# Patient Record
Sex: Female | Born: 1935 | Race: White | Hispanic: No | Marital: Married | Smoking: Never smoker
Health system: Southern US, Community
[De-identification: ages and names within clinical notes are randomized; demographics above are authoritative.]

## PROBLEM LIST (undated history)

## (undated) DIAGNOSIS — T8859XA Other complications of anesthesia, initial encounter: Secondary | ICD-10-CM

## (undated) DIAGNOSIS — K219 Gastro-esophageal reflux disease without esophagitis: Secondary | ICD-10-CM

## (undated) DIAGNOSIS — F419 Anxiety disorder, unspecified: Secondary | ICD-10-CM

## (undated) DIAGNOSIS — I10 Essential (primary) hypertension: Secondary | ICD-10-CM

## (undated) DIAGNOSIS — T4145XA Adverse effect of unspecified anesthetic, initial encounter: Secondary | ICD-10-CM

## (undated) DIAGNOSIS — M1990 Unspecified osteoarthritis, unspecified site: Secondary | ICD-10-CM

## (undated) DIAGNOSIS — E039 Hypothyroidism, unspecified: Secondary | ICD-10-CM

## (undated) DIAGNOSIS — I2699 Other pulmonary embolism without acute cor pulmonale: Secondary | ICD-10-CM

## (undated) DIAGNOSIS — E785 Hyperlipidemia, unspecified: Secondary | ICD-10-CM

## (undated) DIAGNOSIS — R9439 Abnormal result of other cardiovascular function study: Secondary | ICD-10-CM

## (undated) DIAGNOSIS — D649 Anemia, unspecified: Secondary | ICD-10-CM

## (undated) DIAGNOSIS — K589 Irritable bowel syndrome without diarrhea: Secondary | ICD-10-CM

## (undated) DIAGNOSIS — Q602 Renal agenesis, unspecified: Secondary | ICD-10-CM

## (undated) DIAGNOSIS — G709 Myoneural disorder, unspecified: Secondary | ICD-10-CM

## (undated) DIAGNOSIS — Z5189 Encounter for other specified aftercare: Secondary | ICD-10-CM

## (undated) DIAGNOSIS — I209 Angina pectoris, unspecified: Secondary | ICD-10-CM

## (undated) DIAGNOSIS — R569 Unspecified convulsions: Secondary | ICD-10-CM

## (undated) DIAGNOSIS — E1140 Type 2 diabetes mellitus with diabetic neuropathy, unspecified: Secondary | ICD-10-CM

## (undated) DIAGNOSIS — K5790 Diverticulosis of intestine, part unspecified, without perforation or abscess without bleeding: Secondary | ICD-10-CM

## (undated) HISTORY — PX: BACK SURGERY: SHX140

## (undated) HISTORY — DX: Abnormal result of other cardiovascular function study: R94.39

## (undated) HISTORY — PX: CARDIAC CATHETERIZATION: SHX172

## (undated) HISTORY — DX: Unspecified osteoarthritis, unspecified site: M19.90

## (undated) HISTORY — PX: JOINT REPLACEMENT: SHX530

## (undated) HISTORY — PX: CHOLECYSTECTOMY: SHX55

## (undated) NOTE — *Deleted (*Deleted)
Neurology Consultation Reason for Consult: Stroke on MRI Referring Physician: Dr. Whitney Post    CC: Left hand loss of control    History is obtained from: Patient and chart review   HPI: Hannah Fischer is a 58 y.o. female with a past medical history significant for type 2 diabetes (A1c 7.5%), hypertension, hyperlipidemia, hypothyroidism, prior pulmonary embolus, degenerative disc disease, single kidney (agenesis),  She describes that she was in her usual state of health when she began to notice left hand weakness that came on acutely and has not improved since it began.  MRI revealed stroke for which neurology was consulted.  Review of systems is notable with positive for intermittent palpitations sometimes associated with feeling weak, chronic fatigue secondary to chronic poor sleep, mild headache for the past year, but otherwise negative for vision changes, hearing changes, other focal weakness or numbness, bowel or bladder symptoms, rashes, fevers, chills, sweats, marked changes in weight, difficulty speaking or swallowing,  LKW: 10 AM on 10/19 tPA given?: No, due to out of the window; additionally this will be contraindicated in the future given MRI findings concerning for CAA   Premorbid modified rankin scale:      0 - No symptoms.    Past Medical History:  Diagnosis Date  . Abnormal nuclear stress test    2005, normal cath Dr. Elease Hashimoto, normal stress test 2012 Dr. Eldridge Dace  . Anemia    chronic  . Anxiety   . Arthritis    djd  . Arthritis of shoulder region, left, degenerative 12/03/2013  . Blood transfusion 1979   for anemia  . Chest pain    evaluated @ [facility] cardiology; had a stress test 2012  . Chronic pain syndrome 04/29/2019  . Chronic right-sided low back pain without sciatica 04/12/2019  . Complication of anesthesia    unable to breathe lying  flat on back  . Diabetes mellitus    type 2  niddm x 25 yrs  . Diabetes mellitus (HCC) 11/15/2011  . Diverticular disease    . DJD (degenerative joint disease)    lumbar and cervical  . GERD (gastroesophageal reflux disease)   . Hyperlipemia   . Hypertension    on medication  . Hypothyroidism    takes levoxyl  . Irritable bowel syndrome   . 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 Neuromuscular disorder (HCC)   . Neuropathy in diabetes (HCC)   . Nonspecific abnormal electrocardiogram (ECG) (EKG) 10/15/2013  . Other and unspecified angina pectoris    tests came back NOT heart related  . Pulmonary embolism (HCC) 06-2010   bilateral  . Pyuria 11/15/2011  . Seizures (HCC)    once, age 61  . TIA (transient ischemic attack) 11/15/2011   Past Surgical History:  Procedure Laterality Date  . BACK SURGERY    . BLEPHAROPLASTY  2012   rt eye  . CARDIAC CATHETERIZATION     had one 10/2013  . CHOLECYSTECTOMY    . EYE SURGERY  2007   cataract ext/ iol rt eye  . JOINT REPLACEMENT  2001,2005   both knees  . LEFT HEART CATHETERIZATION WITH CORONARY ANGIOGRAM N/A 10/18/2013   Procedure: LEFT HEART CATHETERIZATION WITH CORONARY ANGIOGRAM;  Surgeon: Corky Crafts, MD;  Location: [facility] CATH LAB;  Service: Cardiovascular;  Laterality: N/A;  . LUMBAR DISC SURGERY  1990  . REVERSE SHOULDER ARTHROPLASTY Left 12/03/2013   Procedure: LEFT SHOULDER REVERSE ARTHROPLASTY;  Surgeon: Verlee Rossetti, MD;  Location: [facility] OR;  Service: Orthopedics;  Laterality: Left;  . SHOULDER HEMI-ARTHROPLASTY  12/25/2010   Procedure: SHOULDER HEMI-ARTHROPLASTY;  Surgeon: Cammy Copa;  Location: [facility] OR;  Service: Orthopedics;  Laterality: Right;   Current Outpatient Medications  Medication Instructions  . ALPRAZolam (XANAX) 0.25 mg, Oral, At bedtime PRN  . cephALEXin (KEFLEX) 500 mg, Oral, 2 times daily  . clopidogrel (PLAVIX) 75 mg, Oral, Daily with breakfast  . Cyanocobalamin (VITAMIN B-12 PO) 1 tablet, Oral, Daily  . DULoxetine (CYMBALTA) 30 MG capsule TAKE 1 CAPSULE(30 MG) BY MOUTH DAILY  . levothyroxine (SYNTHROID) 100  mcg, Oral, Daily  . losartan (COZAAR) 100 mg, Oral, Daily  . methocarbamol (ROBAXIN) 500 mg, Oral, Every 8 hours PRN  . Omega-3 Fatty Acids (FISH OIL) 1000 MG CAPS 1 capsule, Oral, 2 times daily  . pioglitazone (ACTOS) 45 mg, Daily  . propranolol (INDERAL) 40 mg, Oral, Daily  . traZODone (DESYREL) 50 MG tablet TAKE 1 TABLET BY MOUTH AT BEDTIME  . Vitamin D3 2,000 Units, Daily    Family History  Problem Relation Age of Onset  . Diabetes Mother   . Heart disease Mother   . Heart disease Sister   . Heart disease Brother     Social History:  reports that she has never smoked. She has never used smokeless tobacco. She reports that she does not drink alcohol and does not use drugs.   Exam: Current vital signs: BP (!) 147/67 (BP Location: Right Arm)   Pulse 68   Temp 98.2 F (36.8 C) (Oral)   Resp 19   Ht 5\' 3"  (1.6 m)   Wt 80.3 kg   SpO2 97%   BMI 31.35 kg/m  Vital signs in last 24 hours: Temp:  [98.1 F (36.7 C)-98.2 F (36.8 C)] 98.2 F (36.8 C) (10/21 0401) Pulse Rate:  [53-88] 68 (10/21 0401) Resp:  [15-27] 19 (10/21 0401) BP: (95-196)/(41-91) 147/67 (10/21 0401) SpO2:  [90 %-99 %] 97 % (10/21 0401)   Physical Exam  Constitutional: Appears well-developed and well-nourished.  Psych: Affect appropriate to situation Eyes: No scleral injection HENT: No OP obstrucion MSK: no joint deformities.  Cardiovascular: Normal rate and regular rhythm.  Respiratory: Effort normal, non-labored breathing GI: Soft.  No distension. There is no tenderness.  Skin: WDI  Neuro: Mental Status: Patient is awake, alert, oriented to person, place, month, year, and situation. Patient is able to give a clear and coherent history. No signs of aphasia or neglect She cannot remember the name of the current president, but names Trump is the prior president Cranial Nerves: II: Visual Fields are full. Pupils are equal, round, and reactive to light.   III,IV, VI: EOMI without ptosis or  diploplia.  V: Facial sensation is symmetric to temperature VII: Facial movement is symmetric.  VIII: hearing is intact to voice X: Uvula elevates symmetrically XI: Shoulder shrug is symmetric. XII: tongue is midline without atrophy or fasciculations.  Motor: Tone is normal. Bulk is normal. 5/5 strength was present in all four extremities with the exception of the left wrist extension (4/5) and finger flexion (4-/5) Sensory: Sensation is symmetric to light touch and temperature in the arms and legs; subtly decreased in the left hand Deep Tendon Reflexes: 2+ and symmetric in the biceps and patellae.  Plantars: Toes are downgoing bilaterally.  Cerebellar: FNF and HKS are intact bilaterally   I have reviewed labs in epic and the results pertinent to this consultation are:   Lab Results  Component  Value Date   CHOL 212 (H) 11/24/2019   HDL 58 11/24/2019   LDLCALC 128 (H) 11/24/2019   TRIG 130 11/24/2019   CHOLHDL 3.7 11/24/2019   Lab Results  Component Value Date   HGBA1C 7.5 (H) 11/23/2019   I have reviewed the images obtained personally:  HCT without clear acute abnormality, chronic findings as detailed in radiology report MRI brain with multifocal small strokes in the posterior circulation bilaterally as well as SWI imaging consistent with CAA MRA with scattered atherosclerotic changes   ECHO w/ grade II diastolic dysfunction, mild cLVH moderate left atrial enlargementn  Impression: The largest stroke on MRI is right at the left cortical hand knob, correlating well with patient's symptoms.  Given the pattern, I favor an atheroembolic or cardioembolic shower of the posterior circulation.  However treatment is complicated by imaging findings of CAA.  Interestingly she does not appear to have much cognitive impairment on at least a cursory evaluation of the same.  Given high risk of life-threatening hemorrhage with cerebral amyloid angiopathy, I favor discontinuing Plavix if the  patient is cleared by cardiology to discontinue it.   Recommendations:  # scattered small posterior strokes, most likely atheroembolic, possibly cardioembolic # MRI w/ c/f cerebral amyloid angiopathy (CAA) - Stroke labs HgbA1c, fasting lipid panel completed as above with need for improvement in cholesterol control (goal LDL < 70) and A1c (goal < 7%) - MRI brain completed as above  - MRA of the brain without contrast and MRA neck w/wo completed as above - Echocardiogram - Despite strokes, given CAA findings with high risk of life-threatening lobar hemorrhage, please discuss with cardiology if Plavix can be discontinued - Risk factor modification - Telemetry monitoring; 30 day event monitor on discharge if no arrythmias captured  - Blood pressure goal   - Normotension - PT consult, OT consult, Speech consult, - Stroke team to follow  Brooke Dare MD-PhD [facility] [phone]

---

## 1977-02-04 DIAGNOSIS — Z5189 Encounter for other specified aftercare: Secondary | ICD-10-CM

## 1977-02-04 HISTORY — DX: Encounter for other specified aftercare: Z51.89

## 1988-02-05 HISTORY — PX: LUMBAR DISC SURGERY: SHX700

## 1999-05-23 ENCOUNTER — Encounter: Payer: Self-pay | Admitting: Family Medicine

## 1999-05-23 ENCOUNTER — Encounter: Admission: RE | Admit: 1999-05-23 | Discharge: 1999-05-23 | Payer: Self-pay | Admitting: Family Medicine

## 1999-05-31 ENCOUNTER — Encounter: Payer: Self-pay | Admitting: Neurosurgery

## 1999-06-04 ENCOUNTER — Encounter: Payer: Self-pay | Admitting: Neurosurgery

## 1999-06-04 ENCOUNTER — Inpatient Hospital Stay (HOSPITAL_COMMUNITY): Admission: RE | Admit: 1999-06-04 | Discharge: 1999-06-05 | Payer: Self-pay | Admitting: Neurosurgery

## 1999-10-04 ENCOUNTER — Encounter: Payer: Self-pay | Admitting: Specialist

## 1999-10-12 ENCOUNTER — Inpatient Hospital Stay (HOSPITAL_COMMUNITY): Admission: RE | Admit: 1999-10-12 | Discharge: 1999-10-16 | Payer: Self-pay | Admitting: Specialist

## 2000-10-14 ENCOUNTER — Encounter: Payer: Self-pay | Admitting: Family Medicine

## 2000-10-14 ENCOUNTER — Encounter: Admission: RE | Admit: 2000-10-14 | Discharge: 2000-10-14 | Payer: Self-pay | Admitting: Family Medicine

## 2000-10-24 ENCOUNTER — Encounter: Payer: Self-pay | Admitting: Surgery

## 2000-10-27 ENCOUNTER — Ambulatory Visit (HOSPITAL_COMMUNITY): Admission: RE | Admit: 2000-10-27 | Discharge: 2000-10-28 | Payer: Self-pay | Admitting: Surgery

## 2000-10-27 ENCOUNTER — Encounter (INDEPENDENT_AMBULATORY_CARE_PROVIDER_SITE_OTHER): Payer: Self-pay | Admitting: *Deleted

## 2000-10-27 ENCOUNTER — Encounter: Payer: Self-pay | Admitting: Surgery

## 2002-03-02 ENCOUNTER — Encounter: Admission: RE | Admit: 2002-03-02 | Discharge: 2002-05-31 | Payer: Self-pay | Admitting: Family Medicine

## 2002-08-17 ENCOUNTER — Encounter: Admission: RE | Admit: 2002-08-17 | Discharge: 2002-11-15 | Payer: Self-pay | Admitting: Family Medicine

## 2003-01-11 ENCOUNTER — Ambulatory Visit (HOSPITAL_COMMUNITY): Admission: RE | Admit: 2003-01-11 | Discharge: 2003-01-11 | Payer: Self-pay | Admitting: Gastroenterology

## 2003-01-11 ENCOUNTER — Encounter (INDEPENDENT_AMBULATORY_CARE_PROVIDER_SITE_OTHER): Payer: Self-pay

## 2003-07-21 ENCOUNTER — Encounter: Admission: RE | Admit: 2003-07-21 | Discharge: 2003-08-25 | Payer: Self-pay | Admitting: Family Medicine

## 2003-08-02 ENCOUNTER — Encounter: Admission: RE | Admit: 2003-08-02 | Discharge: 2003-08-02 | Payer: Self-pay | Admitting: Family Medicine

## 2003-09-28 ENCOUNTER — Ambulatory Visit (HOSPITAL_COMMUNITY): Admission: RE | Admit: 2003-09-28 | Discharge: 2003-09-28 | Payer: Self-pay | Admitting: Cardiovascular Disease

## 2003-10-21 ENCOUNTER — Inpatient Hospital Stay (HOSPITAL_COMMUNITY): Admission: RE | Admit: 2003-10-21 | Discharge: 2003-10-26 | Payer: Self-pay | Admitting: Specialist

## 2004-08-28 ENCOUNTER — Encounter: Admission: RE | Admit: 2004-08-28 | Discharge: 2004-11-26 | Payer: Self-pay | Admitting: Family Medicine

## 2005-02-04 HISTORY — PX: EYE SURGERY: SHX253

## 2005-02-09 ENCOUNTER — Encounter: Admission: RE | Admit: 2005-02-09 | Discharge: 2005-02-09 | Payer: Self-pay | Admitting: Specialist

## 2006-05-29 ENCOUNTER — Inpatient Hospital Stay (HOSPITAL_COMMUNITY): Admission: AD | Admit: 2006-05-29 | Discharge: 2006-06-05 | Payer: Self-pay | Admitting: Family Medicine

## 2006-05-30 ENCOUNTER — Encounter: Payer: Self-pay | Admitting: Vascular Surgery

## 2006-06-02 ENCOUNTER — Ambulatory Visit: Payer: Self-pay | Admitting: Vascular Surgery

## 2006-06-02 ENCOUNTER — Encounter (INDEPENDENT_AMBULATORY_CARE_PROVIDER_SITE_OTHER): Payer: Self-pay | Admitting: Cardiology

## 2006-06-02 ENCOUNTER — Encounter: Payer: Self-pay | Admitting: Vascular Surgery

## 2006-11-14 ENCOUNTER — Ambulatory Visit: Payer: Self-pay | Admitting: Oncology

## 2006-12-24 LAB — CBC WITH DIFFERENTIAL/PLATELET
BASO%: 0.3 % (ref 0.0–2.0)
Basophils Absolute: 0 10*3/uL (ref 0.0–0.1)
EOS%: 2.9 % (ref 0.0–7.0)
Eosinophils Absolute: 0.1 10*3/uL (ref 0.0–0.5)
HCT: 34.3 % — ABNORMAL LOW (ref 34.8–46.6)
HGB: 11.9 g/dL (ref 11.6–15.9)
LYMPH%: 32.8 % (ref 14.0–48.0)
MCH: 31.1 pg (ref 26.0–34.0)
MCHC: 34.7 g/dL (ref 32.0–36.0)
MCV: 89.8 fL (ref 81.0–101.0)
MONO#: 0.4 10*3/uL (ref 0.1–0.9)
MONO%: 7.8 % (ref 0.0–13.0)
NEUT#: 2.5 10*3/uL (ref 1.5–6.5)
NEUT%: 56.2 % (ref 39.6–76.8)
Platelets: 220 10*3/uL (ref 145–400)
RBC: 3.82 10*6/uL (ref 3.70–5.32)
RDW: 14.4 % (ref 11.3–14.5)
WBC: 4.5 10*3/uL (ref 3.9–10.0)
lymph#: 1.5 10*3/uL (ref 0.9–3.3)

## 2006-12-24 LAB — MORPHOLOGY
PLT EST: ADEQUATE
RBC Comments: NORMAL

## 2006-12-24 LAB — PROTIME-INR
INR: 1.7 — ABNORMAL LOW (ref 2.00–3.50)
Protime: 20.4 Seconds — ABNORMAL HIGH (ref 10.6–13.4)

## 2006-12-25 LAB — FIBRINOGEN: Fibrinogen: 310 mg/dL (ref 204–475)

## 2006-12-25 LAB — D-DIMER, QUANTITATIVE (NOT AT ARMC): D-Dimer, Quant: 0.28 ug/mL-FEU (ref 0.00–0.48)

## 2006-12-25 LAB — FACTOR 8 ASSAY: Coagulation Factor VIII: 155 % — ABNORMAL HIGH (ref 73–140)

## 2007-01-05 ENCOUNTER — Ambulatory Visit: Payer: Self-pay | Admitting: Oncology

## 2007-01-06 LAB — CARDIOLIPIN ANTIBODIES, IGG, IGM, IGA
Anticardiolipin IgA: <7 [APL'U] (ref <13)
Anticardiolipin IgG: <7 [GPL'U] (ref <11)
Anticardiolipin IgM: <7 [MPL'U] (ref <10)

## 2007-01-06 LAB — LUPUS ANTICOAGULANT PANEL
DRVVT 1:1 Mix: 42 secs (ref 36.1–47.0)
DRVVT: 55.3 secs — ABNORMAL HIGH (ref 36.1–47.0)
PTT Lupus Anticoagulant: 47.3 secs (ref 36.3–48.8)

## 2007-01-06 LAB — BETA-2 GLYCOPROTEIN ANTIBODIES
Beta-2 Glyco I IgG: <4 U/mL (ref <20)
Beta-2-Glycoprotein I IgA: <4 U/mL (ref <10)
Beta-2-Glycoprotein I IgM: <4 U/mL (ref <10)

## 2007-01-06 LAB — ANTITHROMBIN III: AntiThromb III Func: 91 % (ref 76–126)

## 2007-01-06 LAB — PROTHROMBIN GENE MUTATION

## 2007-01-06 LAB — FACTOR 5 LEIDEN

## 2007-01-16 LAB — PROTHROMBIN GENE MUTATION

## 2007-01-16 LAB — LUPUS ANTICOAGULANT PANEL
DRVVT 1:1 Mix: 35.9 secs — ABNORMAL LOW (ref 36.1–47.0)
DRVVT: 57.3 secs — ABNORMAL HIGH (ref 36.1–47.0)
PTT Lupus Anticoagulant: 50.5 secs — ABNORMAL HIGH (ref 36.3–48.8)
PTTLA 4:1 Mix: 44.9 secs (ref 36.3–48.8)

## 2007-01-16 LAB — IMMUNOFIXATION ELECTROPHORESIS
IgA: 177 mg/dL (ref 68–378)
IgG (Immunoglobin G), Serum: 933 mg/dL (ref 694–1618)
IgM, Serum: 86 mg/dL (ref 60–263)
Total Protein, Serum Electrophoresis: 7.5 g/dL (ref 6.0–8.3)

## 2007-01-16 LAB — BETA-2 GLYCOPROTEIN ANTIBODIES
Beta-2 Glyco I IgG: 6 U/mL (ref <20)
Beta-2-Glycoprotein I IgA: 5 U/mL (ref <10)
Beta-2-Glycoprotein I IgM: <4 U/mL (ref <10)

## 2007-01-16 LAB — ANA: Anti Nuclear Antibody(ANA): NEGATIVE

## 2007-01-16 LAB — CARDIOLIPIN ANTIBODIES, IGG, IGM, IGA
Anticardiolipin IgA: 8 [APL'U] (ref <13)
Anticardiolipin IgG: <7 [GPL'U] (ref <11)
Anticardiolipin IgM: <7 [MPL'U] (ref <10)

## 2007-01-16 LAB — FACTOR 5 LEIDEN

## 2007-01-16 LAB — ANTITHROMBIN III: AntiThromb III Func: 90 % (ref 76–126)

## 2007-05-08 ENCOUNTER — Ambulatory Visit: Payer: Self-pay | Admitting: Oncology

## 2007-05-12 LAB — CBC WITH DIFFERENTIAL/PLATELET
BASO%: 0.3 % (ref 0.0–2.0)
Basophils Absolute: 0 10*3/uL (ref 0.0–0.1)
EOS%: 2 % (ref 0.0–7.0)
Eosinophils Absolute: 0.1 10*3/uL (ref 0.0–0.5)
HCT: 34.4 % — ABNORMAL LOW (ref 34.8–46.6)
HGB: 11.8 g/dL (ref 11.6–15.9)
LYMPH%: 27.1 % (ref 14.0–48.0)
MCH: 30 pg (ref 26.0–34.0)
MCHC: 34.2 g/dL (ref 32.0–36.0)
MCV: 87.9 fL (ref 81.0–101.0)
MONO#: 0.5 10*3/uL (ref 0.1–0.9)
MONO%: 9.5 % (ref 0.0–13.0)
NEUT#: 3.3 10*3/uL (ref 1.5–6.5)
NEUT%: 61.1 % (ref 39.6–76.8)
Platelets: 251 10*3/uL (ref 145–400)
RBC: 3.92 10*6/uL (ref 3.70–5.32)
RDW: 14.6 % — ABNORMAL HIGH (ref 11.3–14.5)
WBC: 5.5 10*3/uL (ref 3.9–10.0)
lymph#: 1.5 10*3/uL (ref 0.9–3.3)

## 2007-05-12 LAB — PROTIME-INR
INR: 2.6 (ref 2.00–3.50)
Protime: 31.2 Seconds — ABNORMAL HIGH (ref 10.6–13.4)

## 2008-01-21 ENCOUNTER — Encounter: Admission: RE | Admit: 2008-01-21 | Discharge: 2008-01-21 | Payer: Self-pay | Admitting: Family Medicine

## 2008-05-05 ENCOUNTER — Ambulatory Visit: Payer: Self-pay | Admitting: Oncology

## 2008-05-10 LAB — CBC WITH DIFFERENTIAL/PLATELET
BASO%: 0.7 % (ref 0.0–2.0)
Basophils Absolute: 0 10*3/uL (ref 0.0–0.1)
EOS%: 3 % (ref 0.0–7.0)
Eosinophils Absolute: 0.1 10*3/uL (ref 0.0–0.5)
HCT: 33.2 % — ABNORMAL LOW (ref 34.8–46.6)
HGB: 11.1 g/dL — ABNORMAL LOW (ref 11.6–15.9)
LYMPH%: 30.7 % (ref 14.0–49.7)
MCH: 29.7 pg (ref 25.1–34.0)
MCHC: 33.6 g/dL (ref 31.5–36.0)
MCV: 88.5 fL (ref 79.5–101.0)
MONO#: 0.4 10*3/uL (ref 0.1–0.9)
MONO%: 8.5 % (ref 0.0–14.0)
NEUT#: 2.5 10*3/uL (ref 1.5–6.5)
NEUT%: 57.1 % (ref 38.4–76.8)
Platelets: 187 10*3/uL (ref 145–400)
RBC: 3.75 10*6/uL (ref 3.70–5.45)
RDW: 15 % — ABNORMAL HIGH (ref 11.2–14.5)
WBC: 4.4 10*3/uL (ref 3.9–10.3)
lymph#: 1.4 10*3/uL (ref 0.9–3.3)

## 2008-05-12 LAB — FOLATE: Folate: >20 ng/mL

## 2008-05-12 LAB — IMMUNOFIXATION ELECTROPHORESIS
IgA: 187 mg/dL (ref 68–378)
IgG (Immunoglobin G), Serum: 872 mg/dL (ref 694–1618)
IgM, Serum: 74 mg/dL (ref 60–263)
Total Protein, Serum Electrophoresis: 6.7 g/dL (ref 6.0–8.3)

## 2008-05-12 LAB — IRON AND TIBC
%SAT: 11 % — ABNORMAL LOW (ref 20–55)
Iron: 42 ug/dL (ref 42–145)
TIBC: 371 ug/dL (ref 250–470)
UIBC: 329 ug/dL

## 2008-05-12 LAB — VITAMIN B12: Vitamin B-12: 256 pg/mL (ref 211–911)

## 2008-05-12 LAB — FERRITIN: Ferritin: 14 ng/mL (ref 10–291)

## 2008-05-12 LAB — ANA: Anti Nuclear Antibody(ANA): NEGATIVE

## 2008-07-08 ENCOUNTER — Ambulatory Visit: Payer: Self-pay | Admitting: Oncology

## 2008-07-12 LAB — CBC & DIFF AND RETIC
BASO%: 0.7 % (ref 0.0–2.0)
Basophils Absolute: 0 10*3/uL (ref 0.0–0.1)
EOS%: 2.6 % (ref 0.0–7.0)
Eosinophils Absolute: 0.1 10*3/uL (ref 0.0–0.5)
HCT: 34.5 % — ABNORMAL LOW (ref 34.8–46.6)
HGB: 12 g/dL (ref 11.6–15.9)
IRF: 0.41 — ABNORMAL HIGH (ref 0.130–0.330)
LYMPH%: 31.4 % (ref 14.0–49.7)
MCH: 31.4 pg (ref 25.1–34.0)
MCHC: 34.7 g/dL (ref 31.5–36.0)
MCV: 90.6 fL (ref 79.5–101.0)
MONO#: 0.5 10*3/uL (ref 0.1–0.9)
MONO%: 10.7 % (ref 0.0–14.0)
NEUT#: 2.6 10*3/uL (ref 1.5–6.5)
NEUT%: 54.6 % (ref 38.4–76.8)
Platelets: 194 10*3/uL (ref 145–400)
RBC: 3.81 10*6/uL (ref 3.70–5.45)
RDW: 15.9 % — ABNORMAL HIGH (ref 11.2–14.5)
RETIC #: 78.5 10*3/uL (ref 19.7–115.1)
Retic %: 2.1 % (ref 0.4–2.3)
WBC: 4.7 10*3/uL (ref 3.9–10.3)
lymph#: 1.5 10*3/uL (ref 0.9–3.3)

## 2008-07-12 LAB — MORPHOLOGY
PLT EST: ADEQUATE
RBC Comments: NORMAL

## 2009-09-24 ENCOUNTER — Encounter: Admission: RE | Admit: 2009-09-24 | Discharge: 2009-09-24 | Payer: Self-pay | Admitting: Specialist

## 2009-11-03 ENCOUNTER — Inpatient Hospital Stay (HOSPITAL_COMMUNITY): Admission: EM | Admit: 2009-11-03 | Discharge: 2009-11-07 | Payer: Self-pay | Admitting: Emergency Medicine

## 2010-02-04 HISTORY — PX: BLEPHAROPLASTY: SUR158

## 2010-04-18 LAB — CBC
HCT: 29.9 % — ABNORMAL LOW (ref 36.0–46.0)
HCT: 31.3 % — ABNORMAL LOW (ref 36.0–46.0)
HCT: 31.4 % — ABNORMAL LOW (ref 36.0–46.0)
HCT: 34.7 % — ABNORMAL LOW (ref 36.0–46.0)
Hemoglobin: 10.1 g/dL — ABNORMAL LOW (ref 12.0–15.0)
Hemoglobin: 10.6 g/dL — ABNORMAL LOW (ref 12.0–15.0)
Hemoglobin: 10.7 g/dL — ABNORMAL LOW (ref 12.0–15.0)
Hemoglobin: 11.7 g/dL — ABNORMAL LOW (ref 12.0–15.0)
MCH: 30.7 pg (ref 26.0–34.0)
MCH: 30.8 pg (ref 26.0–34.0)
MCH: 30.9 pg (ref 26.0–34.0)
MCH: 31 pg (ref 26.0–34.0)
MCHC: 33.7 g/dL (ref 30.0–36.0)
MCHC: 33.8 g/dL (ref 30.0–36.0)
MCHC: 33.9 g/dL (ref 30.0–36.0)
MCHC: 34.3 g/dL (ref 30.0–36.0)
MCV: 89.9 fL (ref 78.0–100.0)
MCV: 90.8 fL (ref 78.0–100.0)
MCV: 90.8 fL (ref 78.0–100.0)
MCV: 92 fL (ref 78.0–100.0)
Platelets: 108 10*3/uL — ABNORMAL LOW (ref 150–400)
Platelets: 90 10*3/uL — ABNORMAL LOW (ref 150–400)
Platelets: 90 10*3/uL — ABNORMAL LOW (ref 150–400)
Platelets: 92 10*3/uL — ABNORMAL LOW (ref 150–400)
RBC: 3.29 MIL/uL — ABNORMAL LOW (ref 3.87–5.11)
RBC: 3.45 MIL/uL — ABNORMAL LOW (ref 3.87–5.11)
RBC: 3.48 MIL/uL — ABNORMAL LOW (ref 3.87–5.11)
RBC: 3.77 MIL/uL — ABNORMAL LOW (ref 3.87–5.11)
RDW: 14.4 % (ref 11.5–15.5)
RDW: 14.4 % (ref 11.5–15.5)
RDW: 14.4 % (ref 11.5–15.5)
RDW: 14.9 % (ref 11.5–15.5)
WBC: 0.9 10*3/uL — CL (ref 4.0–10.5)
WBC: 1 10*3/uL — CL (ref 4.0–10.5)
WBC: 1 10*3/uL — CL (ref 4.0–10.5)
WBC: 1.8 10*3/uL — ABNORMAL LOW (ref 4.0–10.5)

## 2010-04-18 LAB — GLUCOSE, CAPILLARY
Glucose-Capillary: 120 mg/dL — ABNORMAL HIGH (ref 70–99)
Glucose-Capillary: 120 mg/dL — ABNORMAL HIGH (ref 70–99)
Glucose-Capillary: 122 mg/dL — ABNORMAL HIGH (ref 70–99)
Glucose-Capillary: 123 mg/dL — ABNORMAL HIGH (ref 70–99)
Glucose-Capillary: 129 mg/dL — ABNORMAL HIGH (ref 70–99)
Glucose-Capillary: 131 mg/dL — ABNORMAL HIGH (ref 70–99)
Glucose-Capillary: 132 mg/dL — ABNORMAL HIGH (ref 70–99)
Glucose-Capillary: 143 mg/dL — ABNORMAL HIGH (ref 70–99)
Glucose-Capillary: 168 mg/dL — ABNORMAL HIGH (ref 70–99)
Glucose-Capillary: 184 mg/dL — ABNORMAL HIGH (ref 70–99)
Glucose-Capillary: 191 mg/dL — ABNORMAL HIGH (ref 70–99)
Glucose-Capillary: 64 mg/dL — ABNORMAL LOW (ref 70–99)
Glucose-Capillary: 67 mg/dL — ABNORMAL LOW (ref 70–99)
Glucose-Capillary: 86 mg/dL (ref 70–99)
Glucose-Capillary: 93 mg/dL (ref 70–99)

## 2010-04-18 LAB — EHRLICHIA ANTIBODY PANEL
E chaffeensis (HGE) Ab, IgG: <1:64 {titer}
E chaffeensis (HGE) Ab, IgM: <1:20 {titer}
Interpretation-ECHAB: NOT DETECTED

## 2010-04-18 LAB — DIFFERENTIAL
Basophils Absolute: 0 10*3/uL (ref 0.0–0.1)
Basophils Absolute: 0 10*3/uL (ref 0.0–0.1)
Basophils Relative: 0 % (ref 0–1)
Basophils Relative: 1 % (ref 0–1)
Eosinophils Absolute: 0.1 10*3/uL (ref 0.0–0.7)
Eosinophils Absolute: 0.1 10*3/uL (ref 0.0–0.7)
Eosinophils Relative: 10 % — ABNORMAL HIGH (ref 0–5)
Eosinophils Relative: 4 % (ref 0–5)
Lymphocytes Relative: 34 % (ref 12–46)
Lymphocytes Relative: 36 % (ref 12–46)
Lymphs Abs: 0.3 10*3/uL — ABNORMAL LOW (ref 0.7–4.0)
Lymphs Abs: 0.6 10*3/uL — ABNORMAL LOW (ref 0.7–4.0)
Monocytes Absolute: 0 10*3/uL — ABNORMAL LOW (ref 0.1–1.0)
Monocytes Absolute: 0.1 10*3/uL (ref 0.1–1.0)
Monocytes Relative: 3 % (ref 3–12)
Monocytes Relative: 8 % (ref 3–12)
Neutro Abs: 0.6 10*3/uL — ABNORMAL LOW (ref 1.7–7.7)
Neutro Abs: 1 10*3/uL — ABNORMAL LOW (ref 1.7–7.7)
Neutrophils Relative %: 51 % (ref 43–77)
Neutrophils Relative %: 53 % (ref 43–77)
WBC Morphology: INCREASED

## 2010-04-18 LAB — COMPREHENSIVE METABOLIC PANEL
ALT: 68 U/L — ABNORMAL HIGH (ref 0–35)
ALT: 70 U/L — ABNORMAL HIGH (ref 0–35)
ALT: 71 U/L — ABNORMAL HIGH (ref 0–35)
AST: 101 U/L — ABNORMAL HIGH (ref 0–37)
AST: 105 U/L — ABNORMAL HIGH (ref 0–37)
AST: 93 U/L — ABNORMAL HIGH (ref 0–37)
Albumin: 2.6 g/dL — ABNORMAL LOW (ref 3.5–5.2)
Albumin: 2.7 g/dL — ABNORMAL LOW (ref 3.5–5.2)
Albumin: 2.9 g/dL — ABNORMAL LOW (ref 3.5–5.2)
Alkaline Phosphatase: 108 U/L (ref 39–117)
Alkaline Phosphatase: 109 U/L (ref 39–117)
Alkaline Phosphatase: 114 U/L (ref 39–117)
BUN: 10 mg/dL (ref 6–23)
BUN: 6 mg/dL (ref 6–23)
BUN: 7 mg/dL (ref 6–23)
CO2: 23 mEq/L (ref 19–32)
CO2: 25 mEq/L (ref 19–32)
CO2: 26 mEq/L (ref 19–32)
Calcium: 7.9 mg/dL — ABNORMAL LOW (ref 8.4–10.5)
Calcium: 8 mg/dL — ABNORMAL LOW (ref 8.4–10.5)
Calcium: 8.2 mg/dL — ABNORMAL LOW (ref 8.4–10.5)
Chloride: 100 mEq/L (ref 96–112)
Chloride: 103 mEq/L (ref 96–112)
Chloride: 104 mEq/L (ref 96–112)
Creatinine, Ser: 0.62 mg/dL (ref 0.4–1.2)
Creatinine, Ser: 0.72 mg/dL (ref 0.4–1.2)
Creatinine, Ser: 0.72 mg/dL (ref 0.4–1.2)
GFR calc Af Amer: >60 mL/min (ref >60)
GFR calc Af Amer: >60 mL/min (ref >60)
GFR calc Af Amer: >60 mL/min (ref >60)
GFR calc non Af Amer: >60 mL/min (ref >60)
GFR calc non Af Amer: >60 mL/min (ref >60)
GFR calc non Af Amer: >60 mL/min (ref >60)
Glucose, Bld: 108 mg/dL — ABNORMAL HIGH (ref 70–99)
Glucose, Bld: 160 mg/dL — ABNORMAL HIGH (ref 70–99)
Glucose, Bld: 93 mg/dL (ref 70–99)
Potassium: 3.4 mEq/L — ABNORMAL LOW (ref 3.5–5.1)
Potassium: 3.4 mEq/L — ABNORMAL LOW (ref 3.5–5.1)
Potassium: 3.6 mEq/L (ref 3.5–5.1)
Sodium: 132 mEq/L — ABNORMAL LOW (ref 135–145)
Sodium: 135 mEq/L (ref 135–145)
Sodium: 136 mEq/L (ref 135–145)
Total Bilirubin: 0.3 mg/dL (ref 0.3–1.2)
Total Bilirubin: 0.3 mg/dL (ref 0.3–1.2)
Total Bilirubin: 0.4 mg/dL (ref 0.3–1.2)
Total Protein: 5.4 g/dL — ABNORMAL LOW (ref 6.0–8.3)
Total Protein: 5.5 g/dL — ABNORMAL LOW (ref 6.0–8.3)
Total Protein: 5.9 g/dL — ABNORMAL LOW (ref 6.0–8.3)

## 2010-04-18 LAB — LIPID PANEL
Cholesterol: 145 mg/dL (ref 0–200)
HDL: 49 mg/dL (ref >39)
LDL Cholesterol: 75 mg/dL (ref 0–99)
Total CHOL/HDL Ratio: 3 RATIO
Triglycerides: 107 mg/dL (ref <150)
VLDL: 21 mg/dL (ref 0–40)

## 2010-04-18 LAB — ROCKY MTN SPOTTED FVR AB, IGG-BLOOD: RMSF IgG: 0.07 IV

## 2010-04-18 LAB — INFLUENZA PANEL BY PCR (TYPE A & B)
H1N1 flu by pcr: NOT DETECTED
Influenza A By PCR: NEGATIVE
Influenza B By PCR: NEGATIVE

## 2010-04-18 LAB — ROCKY MTN SPOTTED FVR AB, IGM-BLOOD: RMSF IgM: 0.1 IV (ref 0.00–0.89)

## 2010-04-18 LAB — TSH: TSH: 3.398 u[IU]/mL (ref 0.350–4.500)

## 2010-04-18 LAB — PROTIME-INR
INR: 0.94 (ref 0.00–1.49)
Prothrombin Time: 12.8 seconds (ref 11.6–15.2)

## 2010-04-18 LAB — APTT: aPTT: 31 seconds (ref 24–37)

## 2010-04-18 LAB — HEMOGLOBIN A1C
Hgb A1c MFr Bld: 6.1 % — ABNORMAL HIGH (ref <5.7)
Mean Plasma Glucose: 128 mg/dL — ABNORMAL HIGH (ref <117)

## 2010-04-19 LAB — URINALYSIS, ROUTINE W REFLEX MICROSCOPIC
Bilirubin Urine: NEGATIVE
Glucose, UA: 500 mg/dL — AB
Hgb urine dipstick: NEGATIVE
Ketones, ur: NEGATIVE mg/dL
Nitrite: NEGATIVE
Protein, ur: NEGATIVE mg/dL
Specific Gravity, Urine: 1.02 (ref 1.005–1.030)
Urobilinogen, UA: 1 mg/dL (ref 0.0–1.0)
pH: 6 (ref 5.0–8.0)

## 2010-04-19 LAB — CULTURE, BLOOD (ROUTINE X 2)
Culture  Setup Time: 201109302126
Culture  Setup Time: 201109302355
Culture: NO GROWTH
Culture: NO GROWTH

## 2010-04-19 LAB — URINE CULTURE
Colony Count: 9000
Culture  Setup Time: 201110010416
Special Requests: NEGATIVE

## 2010-04-19 LAB — DIFFERENTIAL
Basophils Absolute: 0 10*3/uL (ref 0.0–0.1)
Basophils Relative: 0 % (ref 0–1)
Eosinophils Absolute: 0.1 10*3/uL (ref 0.0–0.7)
Eosinophils Relative: 9 % — ABNORMAL HIGH (ref 0–5)
Lymphocytes Relative: 18 % (ref 12–46)
Lymphs Abs: 0.2 10*3/uL — ABNORMAL LOW (ref 0.7–4.0)
Monocytes Absolute: 0 10*3/uL — ABNORMAL LOW (ref 0.1–1.0)
Monocytes Relative: 3 % (ref 3–12)
Neutro Abs: 0.6 10*3/uL — ABNORMAL LOW (ref 1.7–7.7)
Neutrophils Relative %: 70 % (ref 43–77)

## 2010-04-19 LAB — BASIC METABOLIC PANEL
BUN: 14 mg/dL (ref 6–23)
CO2: 25 mEq/L (ref 19–32)
Calcium: 8.5 mg/dL (ref 8.4–10.5)
Chloride: 97 mEq/L (ref 96–112)
Creatinine, Ser: 0.85 mg/dL (ref 0.4–1.2)
GFR calc Af Amer: >60 mL/min (ref >60)
GFR calc non Af Amer: >60 mL/min (ref >60)
Glucose, Bld: 137 mg/dL — ABNORMAL HIGH (ref 70–99)
Potassium: 3.8 mEq/L (ref 3.5–5.1)
Sodium: 130 mEq/L — ABNORMAL LOW (ref 135–145)

## 2010-04-19 LAB — GLUCOSE, CAPILLARY
Glucose-Capillary: 121 mg/dL — ABNORMAL HIGH (ref 70–99)
Glucose-Capillary: 132 mg/dL — ABNORMAL HIGH (ref 70–99)

## 2010-04-19 LAB — URINE MICROSCOPIC-ADD ON

## 2010-04-19 LAB — CBC
HCT: 31.8 % — ABNORMAL LOW (ref 36.0–46.0)
Hemoglobin: 11 g/dL — ABNORMAL LOW (ref 12.0–15.0)
MCH: 31 pg (ref 26.0–34.0)
MCHC: 34.5 g/dL (ref 30.0–36.0)
MCV: 89.9 fL (ref 78.0–100.0)
Platelets: 135 10*3/uL — ABNORMAL LOW (ref 150–400)
RBC: 3.54 MIL/uL — ABNORMAL LOW (ref 3.87–5.11)
RDW: 14.6 % (ref 11.5–15.5)
WBC: 0.9 10*3/uL — CL (ref 4.0–10.5)

## 2010-06-05 DIAGNOSIS — I2699 Other pulmonary embolism without acute cor pulmonale: Secondary | ICD-10-CM

## 2010-06-05 HISTORY — DX: Other pulmonary embolism without acute cor pulmonale: I26.99

## 2010-06-22 NOTE — H&P (Signed)
NAME:  EIKO, MCGOWEN NO.:  [id number]   MEDICAL RECORD NO.:  [id number]                   PATIENT TYPE:  OIB   LOCATION:                                       FACILITY:  [facility]   PHYSICIAN:  Vesta Mixer, M.D.              DATE OF BIRTH:  Apr 08, 1932   DATE OF ADMISSION:  09/28/2003  DATE OF DISCHARGE:                                HISTORY & PHYSICAL   HISTORY OF PRESENT ILLNESS:  Ms. Paradiso is a 77 year old female with a  history of an abnormal EKG.  We saw her preoperatively for knee surgery with  a stress Cardiolite study.  The stress Cardiolite study revealed some  abnormalities and she is now referred for heart catheterization for further  evaluation prior to her knee surgery.   Ms. Dykman has a history of diabetes mellitus, hypercholesterolemia,  hypertension and a family history of coronary artery disease.  She has not  had any episodes of chest pain. She does have some occasional episodes of  shortness of breath, especially with exertion.  Over the past several  months, she has had more problems with her knee and has not really been able  to get up and exercise much.  She denies any PND or orthopnea.  She denies  any syncope or presyncopal episodes.   We performed an adenosine Cardiolite which revealed evidence of  inferolateral ischemia.  She had a slight anterior defect, but this was  probably due to breast artifact.   CURRENT MEDICATIONS:  1. Glucotrol XL 10 mg a day.  2. Potassium chloride 20 mEq a day.  3. Atenolol 25 mg a day.  4. Actos 45 mg a day.  5. Prinzide 20/25 mg a day.  6. Levoxyl 75 mg a day.  7. Aspirin 81 mg a day.  8. Naprosyn 500 mg a day.  9. Metformin 500 mg a day.  10.      Fish oil capsules -- 1 g a day.  11.      Flaxseed oil 1 g a day.   ALLERGIES:  She has no known drug allergies.   PAST MEDICAL HISTORY:  1. Diabetes mellitus.  2. Hypercholesterolemia.  3. Hypertension.  4. History of knee  problems.   SOCIAL HISTORY:  The patient does not smoke and does not drink alcohol.   FAMILY HISTORY:  The patient's father died at age 24 due to a  cerebrovascular accident.  Her mother died at age 5 due to complications  from a myocardial infarction.   REVIEW OF SYSTEMS:  Review of systems was reviewed and is essentially  negative.   PHYSICAL EXAMINATION:  GENERAL:  On exam, she is an elderly female in no  acute distress.  She is alert and oriented x3 and mood and affect are  normal.  Her weight is 211.  VITAL SIGNS:  Her blood pressure is 150/90 with a  heart rate of 60.  HEENT/NECK:  Her HEENT exam reveals 2+ carotids.  She has no bruits.  There  is no JVD and no thyromegaly.  LUNGS:  Lungs are clear to auscultation.  HEART:  Regular rate, S1 and S2, with no murmurs, gallops or rubs.  ABDOMEN:  Abdominal exam reveals good bowel sounds and is nontender.  EXTREMITIES:  She has no clubbing, cyanosis, or edema.  NEUROLOGIC:  Exam is nonfocal.   ASSESSMENT:  Ms. Barrie presents for a preoperative knee surgery.  She was  found to have some inferior ischemia on a Cardiolite scan.  I have advised  her to have a heart catheterization prior to her knee surgery.  We have  discussed the risks, benefits and options of heart catheterization.  She  understands and agrees to proceed.                                                Vesta Mixer, M.D.    PJN/MEDQ  D:  09/22/2003  T:  09/22/2003  Job:  [id number]   cc:   Tammy R. Collins Scotland, M.D.  [address]  [place]  [place] [address]  Fax: [phone]

## 2010-06-22 NOTE — Discharge Summary (Signed)
NAMEADRIANNA, Hannah Fischer               ACCOUNT NO.:  [id number]   MEDICAL RECORD NO.:  [id number]          PATIENT TYPE:  INP   LOCATION:  1438                         FACILITY:  [facility]   PHYSICIAN:  Andres Shad. Rudean Curt, MD     DATE OF BIRTH:  October 06, 1932   DATE OF ADMISSION:  05/29/2006  DATE OF DISCHARGE:  06/05/2006                               DISCHARGE SUMMARY   DISCHARGE DIAGNOSIS:  Pulmonary embolism.   DISCHARGE MEDICATIONS:  1. Coumadin 7.5 mg daily.  2. Levothyroxine 775 mcg daily.  3. Potassium chloride supplements.  4. Atenolol 50 mg daily.  5. Metformin 1000 mg daily.  6. Lisinopril/HCTZ 20/25 mg daily.  7. Actos 45 mg daily.  8. Meloxicam 7.5 mg daily.  9. Aspirin 81 mg daily.  10.Fish oil supplements.  11.Saline nasal spray.   Patient has been instructed to discontinue Glucotrol XL because of  hypoglycemic episodes during this hospitalization.   FOLLOW UP:  Patient has been instructed to follow up with Dr. Theresia Lo  tomorrow, May 2nd, for a follow-up PT/INR.   SUMMARY OF HOSPITALIZATION:  Hannah Fischer is a 77 year old female with a  past medical history notable for diabetes mellitus, hypertension,  dyslipidemia, and hypothyroidism.  The summary of her presentation can  be found in her history and physical.  Briefly, the patient had been  evaluated by her primary care physician for a 3-4 week history of colds  and upper respiratory infection symptoms associated with shortness of  breath.  As part of her evaluation, she had a CT scan of the chest which  revealed bilateral pulmonary emboli.   She was admitted to the hospital on April 24th and begun on low  molecular weight heparin as well as Coumadin.  She was empirically  treated with antibiotics during her hospitalization, but these were  discontinued because of associated diarrhea.  C. difficile assays were  negative.   The patient did complain of dizziness, especially when standing up,  throughout her  hospitalization.  An echocardiogram was performed to  evaluate hemodynamic compromise due to her pulmonary embolism.  This did  not have any significant abnormalities.  The patient was given some IV  hydration, which improved her dizziness somewhat, although not  completely.  She had some episodes of hypoglycemia, and her Glucotrol XL  was therefore held.  The patient did ask for it to be restarted, but she  became hypoglycemic again after restarting this medication, and she has  therefore been instructed to discontinue it following discharge.   At the time of discharge, her INR was 2.1.  The pharmacy consultant had  recommended continuing 7.5 mg daily with the option to followup.   ASSESSMENT/PLAN:  1. Pulmonary embolism:  Patient should continue on Coumadin 7.5 mg      daily.  She will need close followup with INR monitoring, and she      has been told to expect a six month course of therapy.  2. Dizziness:  It is unclear why the patient is dizzy.  It may be to      some degree due to  hypovolemia earlier in the hospitalization as      well as relative inactivity while in the hospital.  If this      continues, she will potentially need further workup as an      outpatient.  3. Diabetes mellitus:  The patient should continue her normal      medications except for Glucotrol XL because of hypoglycemia;      however, she should continue to monitor her blood sugar as an      outpatient and restart on this agent is necessary.      Andres Shad. Rudean Curt, MD  Electronically Signed     PML/MEDQ  D:  06/05/2006  T:  06/05/2006  Job:  [id number]   cc:   Hannah Fischer, M.D.  Fax: [phone]

## 2010-06-22 NOTE — H&P (Signed)
Hannah Fischer, Hannah Fischer               ACCOUNT NO.:  [id number]   MEDICAL RECORD NO.:  [id number]          PATIENT TYPE:  INP   LOCATION:  1319                         FACILITY:  [facility]   PHYSICIAN:  Barnetta Chapel, MDDATE OF BIRTH:  1932-11-16   DATE OF ADMISSION:  05/29/2006  DATE OF DISCHARGE:                              HISTORY & PHYSICAL   STAT HISTORY AND PHYSICAL   PRESENTING PROBLEM:  Bilateral PE.   HISTORY OF PRESENTING COMPLAINT:  The patient is a 77 year old female  with past medical history significant for diabetes mellitus,  hypertension, dyslipidemia, and hypothyroidism.  The patient presented  with a three to four week history of cold and URI symptoms with  associated shortness of breath.  The shortness of breath is worse on  breathing.  The patient was seen by the primary care Amias Hutchinson and was  treated with some antibiotics.  The urinary symptoms improved, but the  shortness of breath continued.  The primary care Joleen Stuckert eventually  sent the patient for CT scan of the chest, which revealed bilateral  pulmonary embolism.  The patient was sent in for further evaluation and  management.  The patient denies any family history of blood clots, she  has never had any blocked arterials in the past, and is not on any  medications that will predispose to blood clots.  She has not lost any  weight.  She had a colonoscopy about six months ago that was said to be  normal.  No recent falls or trauma.  No headaches, no GI symptoms, no  urinary symptoms.  The patient has sinus symptoms.  She also has  postnasal drip symptoms.  She has had a cough since the last three to  four weeks.   PAST MEDICAL HISTORY:  Essentially as above.   PAST SURGICAL HISTORY:  1. Bilateral knee replacement.  2. Cholecystectomy.   MEDICATIONS ON ADMISSION:  1. Glucotrol-XL 10 mg p.o. once daily.  2. Metformin 500 mg p.o. twice daily.  3. Levoxyl 75 mcg p.o. once daily.  4. Atenolol 50 mg p.o.  once daily.  5. Lisinopril/HCT 20/25 one tab p.o. once daily.  6. Actos 45 mg p.o. once daily.  7. Fish oil two tabs p.o. once daily.  8. KCL 20 mEq po once daily.  9. Zocor 80 mg p.o. daily.  10.Aspirin 81 mg p.o. once daily.  11.Mobic 7.5 mg p.r.n.  12.Ultram p.r.n.   ALLERGIES:  1. SULFA.  2. CODEINE.   SOCIAL HISTORY:  The patient is married with two children.  Her children  live locally.  She does not smoke cigarettes, does not drink alcohol or  illicit substances.   FAMILY HISTORY:  Significant for CVA, a cancer, and myocardial  infarction.   REVIEW OF SYSTEMS:  Essentially as above.   PHYSICAL EXAMINATION ON ADMISSION:  VITAL SIGNS:  The temp is 97.9, the  blood pressure is 113/67, the heart rate is 70, and the respiratory rate  is 16.  O2 SAT is 99% on room air.  GENERAL CONDITION:  The patient is not in any acute distress.  HEENT:  No pallor, no jaundice, extraocular muscle movements are intact.  There is mild to moderate tenderness at the left maxillary sinus on  palpation.  NECK:  Supple.  There is tender full mandibular lymphadenopathy.  No  raised JVD.  LUNGS:  Clear to auscultation.  CARDIOVASCULAR:  S1 and S2, no heart murmur appreciated.  ABDOMEN:  Obese, soft and nontender.  Bowel sounds are present.  No  organomegaly.  NEURO EXAM:  Nonfocal.  EXTREMITIES:  No edema.   IMPRESSION:  1. Bilateral pulmonary embolism.  2. Diabetes mellitus.  3. History of hypertension.  4. Dyslipidemia.  5. Suspect left maxillary sinusitis.   PLAN:  Will admit patient to the regular medical floor.  Will get  Doppler venous ultrasound of lower extremities.  Will check stool for  fecal occult blood x3, and if any stool is positive will work the  patient up further.  Will also check ESR.  Will also get a sinus x-ray.  Will go ahead and start the patient on IV Ceftriaxone 1 gm daily for 10  days (that is if the sinus x-ray is suggestive of sinusitis).      Barnetta Chapel, MD  Electronically Signed     SIO/MEDQ  D:  05/29/2006  T:  05/29/2006  Job:  [id number]

## 2010-06-22 NOTE — Cardiovascular Report (Signed)
NAME:  Hannah Fischer, Hannah Fischer                         ACCOUNT NO.:  [id number]   MEDICAL RECORD NO.:  [id number]                   PATIENT TYPE:  OIB   LOCATION:  2899                                 FACILITY:  [facility]   PHYSICIAN:  Vesta Mixer, M.D.              DATE OF BIRTH:  08-07-1932   DATE OF PROCEDURE:  09/28/2003  DATE OF DISCHARGE:                              CARDIAC CATHETERIZATION   Ms. Pellicano is a 77 year old female with a history of an abnormal EKG.  She  was seen for a Cardiolite study prior to having some knee surgery.  The  Cardiolite study was abnormal and revealed an inferior lateral defect.  She  was referred for heart catheterization for further evaluation.   PROCEDURE:  Left heart catheterization with coronary angiography.   The right femoral artery was easily cannulated using the modified Seldinger  technique.   HEMODYNAMICS:  The left ventricular pressure was 150/6 with an aortic  pressure of 141/73.   CORONARY ANGIOGRAPHY:  1. Left main is smooth and normal.  2. The left anterior descending artery is fairly smooth and normal.  It was     small to moderate size.  There was a moderate size first diagonal branch     which was normal.  3. The left circumflex artery is a small to moderate size vessel, but is     otherwise unremarkable.  The distal circumflex supplies a small posterior     lateral branch.  4. The right coronary artery is large and dominant.  It is smooth and normal     throughout its course.  The posterior descending artery and the posterior     lateral segment are normal.   LEFT VENTRICULOGRAM:  Left ventriculogram revealed normal left ventricular  systolic function.  Ejection fraction was around 65-70%.   COMPLICATIONS:  None.   CONCLUSIONS:  1. Smooth and normal coronary arteries.  2. Normal left ventricular systolic function.  3. There was a very small aortic valve gradient.  We were able to cross the     aortic valve without too much  difficulty, but still was a very small     gradient.  The valve does not appear to be significantly calcified.   The patient has normal coronary arteries and normal left ventricular  systolic function.  She should be at low risk for her upcoming knee surgery.                                               Vesta Mixer, M.D.    PJN/MEDQ  D:  09/28/2003  T:  09/28/2003  Job:  [id number]   cc:   Tammy R. Collins Scotland, M.D.  [address]  [place]  [place] [address]  Fax: [phone]

## 2010-06-22 NOTE — Consult Note (Signed)
NAME:  Hannah Fischer, Hannah Fischer                         ACCOUNT NO.:  [id number]   MEDICAL RECORD NO.:  [id number]                   PATIENT TYPE:  INP   LOCATION:  0471                                 FACILITY:  [facility]   PHYSICIAN:  Theone Stanley, MD                DATE OF BIRTH:  [birth year]   DATE OF CONSULTATION:  DATE OF DISCHARGE:                                   CONSULTATION   REASON FOR CONSULTATION:  Hypertension and diabetes management post surgery.   HISTORY OF PRESENT ILLNESS:  Hannah Fischer is a 77 year old Caucasian female  who status post right knee replacement on the 16th without any  complications.  She had a spinal epidural and is doing well postoperatively.  The patient has had diabetes for greater than 10 years and hypertension for  5 years.  She takes her blood sugars about 3-4 times a day and her sugars  range from 70-200.  It appears that she, more than likely, is in the 100-200  range.  She states it all depends on what she eats.  She takes her blood  pressure at home and she states it is always below 140 systolic.  She has  osteoarthritis, hypothyroidism.   PAST SURGICAL HISTORY:  1.  Lumbar surgery.  2.  Left knee replacement four years ago.   MEDICATIONS:  1.  Fish oil and flax seed oil.  2.  Levothyroxine 0.075.  3.  Potassium 20 mEq.  4.  Glucotrol XL 10 mg 1 p.o. daily.  5.  Metformin XT 500 two tablets 1 p.o. daily.  6.  Atenolol 25 mg 1 p.o. daily.  7.  Lisinopril/hydrochlorothiazide 20/25 one p.o. daily.  8.  Actos 45 mg 1 p.o. daily.   ALLERGIES:  1.  Sulfa which causes GI distress.  2.  Codeine which makes her very hyperactive.   PHYSICAL EXAMINATION:  VITAL SIGNS:  Blood pressure 121/55, pulse of 55,  saturation 100% on 2 L, respiratory rate of 11, temperature at 97.1.  HEENT:  Head atraumatic, normocephalic.  Eyes 3 mm and pupils equal and  reactive to light.  Extraocular movements intact.  Ears and nose no  discharge.  Throat clear.  No  erythema, no exudate.  Mucosa is moist.  NECK:  Supple.  CARDIOVASCULAR:  Regular rate and rhythm.  No murmur, rub, or gallop heard.  LUNGS:  Clear to auscultation bilaterally on anterior exam.  ABDOMEN:  Soft, nontender, and nondistended with hypoactive bowel sounds.  EXTREMITIES:  No edema, cyanosis, or clubbing.   LABORATORY DATA:  Done on the 12th showed a white count of 4, hemoglobin of  12, hematocrit of 37, platelets of 207.  Sodium 138, potassium of 4.2,  chloride of 104, CO2 of 28, BUN at 21, creatinine at 0.8, glucose at 188.  A  urinalysis was done on the 14th, which was positive for nitrites, white  cells, bacteria, and leukocyte  esterase.   ASSESSMENT/PLAN:  A 77 year old white female who is status post right knee  replacement.  1.  Hypertension.  We will continue all of her home medications of atenolol      and lisinopril/hydrochlorothiazide as long as her blood pressure is      above 120.  Because of anesthetic we have to be concerned about lower      blood pressure for this patient.  I would her blood pressure if her      systolic goes below 120.  2.  Diabetes.  Continue her oral medications which include metformin,      Glucotrol, and Actos.  However, if at any point in time she is going to      be NPO, I would stop these and switch to insulin.  In the meantime I      will cover her with an insulin sliding scale, which I wrote on the      orders.  She is going to have blood sugars a.c. and h.s.  3.  Urinary tract infection.  The patient had a positive urinalysis on the      14th.  There are no cultures.  She received four doses, prior to her      surgery, of Cipro.  I would continue with the Cipro since she does have      a Foley in and stop the Cipro two days after removing the Foley and      checking another urinalysis at that time.      AEJ/MEDQ  D:  10/21/2003  T:  10/23/2003  Job:  [id number]

## 2010-06-22 NOTE — H&P (Signed)
[facility]  Patient:    Hannah Fischer, Hannah Fischer                        MRN: [id number] Adm. Date:  06/04/99 Attending:  Hewitt Shorts, M.D. CC:         Hewitt Shorts, M.D.                         History and Physical  HISTORY OF PRESENT ILLNESS:  The patient is a 77 year old right-handed white female who was evaluated for acute right lumbar radiculopathy.  Her symptoms began about 2-1/2 weeks ago when she developed pain, numbness and paraesthesias into her right lower extremity, particularly through the medial aspect of her right thigh, knee and leg, into the right foot and toes.  She has had some discomfort into the right groin as well as mild discomfort in the low back, but clearly, the right lower extremity pain is much worse than the discomfort that she experiences in her low back.  She denies any left lower extremity symptoms.  She does say that she seems to think that her right lower extremity had a tendency to giveaway.  She was treated with Prinivil and Dosepak without relief.  Pain pills she does not tolerate well.  An MRI scan was obtained and neurosurgical consultation was requested.  MRI shows degenerative spondylolisthesis at L4-5 and L5-S1.  The MRI scan shows  extensive degenerative disc disease with spondylosis, but the most significant changes seen and those most likely responsible for radiculopathy are that of a right L2-3 and right L3-4 extraforaminal disc herniation.  The patient is now admitted for a right L2-3 and right L3-4 extraforaminal microdiscectomy.  PAST MEDICAL HISTORY:  Notable for a history of diabetes, treated for ten years and a history of hypertension, treated for three years.  She denies any history of:  myocardial infarction, cancer, stroke, peptic ulcer disease and lung disease.  PREVIOUS SURGERY:  Left knee surgery by Dr. Criss Alvine in 1980 and ______ in 1996. She apparently does need a knee  replacement but is holding off on that.  ALLERGIES:  She also reports allergies to SULFA and CODEINE.  CURRENT MEDICATIONS:  Glucotrol XL 5 mg q.d., Tenormin 25 mg q.d., levoxyl 75 mcg q.d., Prinzide 20/25 mg p.o. q.d., Vioxx 25 mg p.o. q.d., Lipitor 10 mg p.o. q.d., Actos 30 mg p.o. q.d. and K-Dur 20 mEq p.o. q.d.  FAMILY HISTORY:  Mother died at age 80; she had diabetes and died of a heart attack.  Father died at age 33 of a stroke.  SOCIAL HISTORY:  The patient is married; she is retired.  She does not smoke. he does not drink alcoholic beverages.  She denies a history of substance abuse.  REVIEW OF SYSTEMS:  Notable for those difficulties described in her History of Present Illness and Past Medical History.  She does have hypercholesterolemia and thyroid disease, but the review of systems is otherwise unremarkable.  PHYSICAL EXAMINATION:  GENERAL:  The patient is a somewhat heavy-set white female in no acute distress.  VITAL SIGNS:  Temperature 97.9, pulse 62, blood pressure 122/90, respiratory rate 18.  Height 5 ft 4-1/2 in.  Weight 193 pounds.  LUNGS:  Clear to auscultation; she has symmetrical respiratory excursion.  HEART:  Regular rate and rhythm; S1, and S2.  There is no murmur.  ABDOMEN:  Soft and nontender; bowel sounds are present.  EXTREMITIES:  Shows no clubbing, cyanosis or edema.  VASCULAR:  Shows pulses are 1 at the dorsalis pedis and posterior tibials bilaterally.  MUSCULOSKELETAL:  Examination showed no tenderness to palpation over the lumbar  spinous process or paralumbar musculature.  She is able to flex to 90 degrees and able to extend well.  She has no significant discomfort on range of motion of the low back.  Straight leg raising is negative on the left, but on the right at 80 to 90 degrees, she gets pain into the right groin.  NEUROLOGIC:  Examination shows significant proximal right lower extremity weakness. The right iliopsoas is 3/5;  the right quadriceps is 4/5 and distal strength in he right lower extremity is good: being 5/5 in dorsiflexion and plantar flexion and in the extensor hallucis longus.  ______ musculature of the left lower extremity is all 5/5, including the iliopsoas, quadriceps, dorsiflexion, plantar flexion and  extensor hallucis longus.  Sensation is diminished in the right lower extremity, particularly in the medial aspect of the right leg, but somewhat evens up in the lateral aspect of the right leg and the medial aspect of the right foot. Sensation is intact to pinprick throughout the left lower extremity reflexes.  The left quadriceps to the right is 1, gastrocnemius are 2 bilaterally and toes are down- going bilaterally.  She has normal gait and stance.  IMPRESSION:  Acute right lumbar radiculopathy, secondary to right L2-3 and right L3-4 extraforaminal disc herniations.  We discussed the options for treatment at length in my office.  She had initially elected to proceed with extraforaminal nerve route blocks; however, she called ack and requested that we cancel the nerve root blocks and instead, proceed with surgery as soon as feasible and therefore, the patient is admitted for such.  We did discuss the nature of surgery, typical length of surgery, hospital stay nd overall recuperation, her limitations during the postoperative period and risks - including the risks of infection, bleeding, the possible need for transfusion, he risk of nerve root dysfunction, pain, weakness, numbness or paraesthesias, the isk of recurrent disc herniation, the possible need for further surgery, the anesthetic risks, myocardial infarction, stroke, pneumonia and death.  Understanding all this, she does wish to proceed with surgery and is admitted for such. DD:  06/04/99 TD:  06/04/99 Job: [id number]

## 2010-06-22 NOTE — Op Note (Signed)
[facility]  Patient:    Hannah Fischer, Hannah Fischer                  MRN: [id number] Proc. Date: 10/12/99 Adm. Date:  [id number] Attending:  Erasmo Leventhal CC:         Tammy R. Collins [place], M.D.   Operative Report  PREOPERATIVE DIAGNOSIS:  Left knee end-stage osteoarthritis.  POSTOPERATIVE DIAGNOSIS:  Left knee end-stage osteoarthritis.  PROCEDURE:  Left total knee arthroplasty.  SURGEON:  R. Valma Cava, M.D.  ASSISTANT: French Ana Shuford, P.A.-C.  ANESTHESIA:  Spinal.  ESTIMATED BLOOD LOSS:  Less than 50 cc.  DRAINS:  Hemovac.  COMPLICATIONS:  None.  TOURNIQUET TIME:  One hour 33 minutes at 400 mmHg.  DISPOSITION:  To PACU stable.  OPERATIVE IMPLANTS:  Osteonics components, all cemented, size #7 femur, size #7 tibia, 26 mm patella with a 10 mm tibial insert.  INFORMED CONSENT:  The patient was counseled in the holding area, chart reviewed and all was signed appropriately.  DESCRIPTION OF PROCEDURE:  The IV was started and antibiotics were given. Taken to the operating room where a spinal anesthetic was administered.  Foley catheter was placed utilizing sterile technique by the OR circulating nurse. Preoperative antibiotics had been given.  Left knee was examined; 30 degree flexion contraction, knee elevated.  Prepped with DuraPrep and draped in a sterile fashion.  Exsanguinated with an Esmarch.  Tourniquet was inflated to 400 mmHg.  A standard anterior midline incision was made through the skin and subcutaneous tissue.  She had an old medial arthrotomy incision.  I kept the scar away from this as much as possible to help keep the skin flaps viable. Hemostasis was obtained.  Medial and lateral skin flaps were developed at the appropriate level.  The medial probe patellar arthrotomy was performed. Medial soft tissue release was done.  The knee was then flexed.  End-stage arthritic change.  Bone-on-bone contact.  The cruciate ligaments  were resected.  The ___________, distal femur canal was irrigated.  Intermedullary rod was placed.  We kept the distal femur at a 5 degree valgus cut with the 10 mm cut off the distal femur.  The distal femur was found to be a size #7. Rotational marks were made.  Distal femur was cut appropriately.  The medial and lateral menisci were removed.  Genu clips were coagulated.  Tibia meniscus was resected.  The proximal tibia was found to be a size #7.  Starting hole was made. Septum was utilized.  Canal was irrigated.  Intermedullary rod was gently placed.  We chose a 5 degree approach to the slope.  Rotation was set.  It took a 10 mm cut based on the lateral side and then increased to 2 mm more to proximal tibia cut.  Posterior medial and posterior lateral osteophytes were removed.  The femoral trochlea was prepared in the standard fashion.  Trials of 7 mm femur and 7 mm tibia with a 10 mm polyethylene insert with excellent range of motion and soft tissue balance and alignment. Rotational marks made of the proximal tibial and delta keel was performed in the standard fashion.  The patella was found to be a size 26 mm.  It was reamed to a depth of 10 mm, locking holes were made and any excessive bone was removed.  At this point in time, utilizing a modern cement technique, all components were cemented into place, a size #7 tibia and a size #7 femur with a 26  mm patella.  With the trial of 26 mm insert we had excellent range of motion and soft tissue balance in flexion and extension.  The knee was then flexed.  Excess cement was removed.  We put into place a final component 10 mm tibial insert.  The knee was then well-balanced.  The patellofemoral tracking was normal with tilting a little bit to the lateral side.  At this point in time, a lateral release was performed given anatomic patellofemoral tracking. The knee was subsequently irrigated with antibiotic solution during the closure.  Two  medium Hemovac drains were placed.  A sequential closure of the layers was next done with Vicryl in the synovium, arthrotomy with Vicryl and subcu with Vicryl.  Skin closed with staples.  Then 20 cc of 0.5% Marcaine with epinephrine was placed into the knee joint through the drain.  Sterile dressing was applied.  The tourniquet was deflated.  He had normal pulse in the foot and ankle at the end of the case.  Sponge and needle counts were correct.  There were no complications.  She was given Ancef intravenously, and the tourniquet was deflated.  She was taken from the operating room to the PACU in stable condition.  There were no complications.  To decrease surgical time with help throughout the entire procedure, Ms. Shufords assistance was needed throughout this case. DD:  10/12/99 TD:  10/14/99 Job: [id number]

## 2010-06-22 NOTE — H&P (Signed)
NAME:  Hannah Fischer, ELZA NO.:  [id number]   MEDICAL RECORD NO.:  [id number]                   PATIENT TYPE:  INP   LOCATION:  NA                                   FACILITY:  [facility]   PHYSICIAN:  Erasmo Leventhal, M.D.         DATE OF BIRTH:  1932-10-18   DATE OF ADMISSION:  10/21/2003  DATE OF DISCHARGE:                                HISTORY & PHYSICAL   CHIEF COMPLAINT:  Osteoarthritis, right knee.   HISTORY OF PRESENT ILLNESS:  This is a 77 year old female well-known to [place]  with a history of previous left total knee arthroplasty for end-stage  osteoarthritis, who has end-stage osteoarthritis of her right knee and has  failed conservative management.  After a discussion of treatment options,  risks, benefits, and aftercare, including stiffness, infection, DVT with  possible PE, the patient is now scheduled for a total knee arthroplasty of  the right knee.  Questions invited and answered.  Surgery to go ahead as  scheduled.   The patient's medical doctor is Sigmund Hazel, M.D., at [facility].   PAST MEDICAL HISTORY:  Drug allergies to SULFA  with nausea and vomiting and  CODEINE with nervousness.   Medications include:  1.  Levothyroxine 75 mcg daily in the a.m.  2.  Potassium chloride ER Micro 20 mEq q.a.m.  3.  Glucotrol XL 10 mg q.a.m.  4.  Metformin ER 500 mg two q.h.s.  5.  Atenolol 25 mg q.a.m.  6.  Lisinopril/hydrochlorothiazide 20/25 mg one q.a.m.  7.  Actos 45 mg one q.a.m.   Previous surgeries include knee arthroscopy, lumbar laminectomy, left knee  total knee arthroplasty, and cholecystectomy.   Serious medical illnesses include diabetes, hypertension, and  hypothyroidism.   SOCIAL HISTORY:  The patient is married, retired.  She does not smoke or  drink.   FAMILY HISTORY:  Positive for diabetes, CVA, and osteoarthritis.   REVIEW OF SYSTEMS:  CENTRAL NERVOUS SYSTEM:  Negative for headache, blurred  vision, or dizziness.  PULMONARY:  Positive for exertional shortness of  breath and orthopnea, one-pillow.  GASTROINTESTINAL:  Positive for  diverticulitis.  GENITOURINARY:  Negative for urinary tract difficulties.  CARDIOVASCULAR:  Negative for chest pain or palpitations.   PHYSICAL EXAMINATION:  VITAL SIGNS:  BP 140/82, respirations 18, pulse 72  and regular.  GENERAL:  She is a well-developed, well-nourished lady in no acute distress.  HEENT:  Head normocephalic.  Nose patent.  Ears patent.  Pupils equal,  round, and reactive to light.  Throat without injection.  NECK:  Supple without adenopathy.  Carotid 2+ without bruit.  CHEST:  Clear to auscultation, no rales or rhonchi.  Respirations 18.  CARDIAC:  Regular rate and rhythm at 72 beats per minute without murmur.  ABDOMEN:  Soft with active bowel sounds.  No masses or organomegaly.  NEUROLOGIC:  The patient is alert and oriented to time, place, and person.  Cranial nerves II-XII grossly intact.  EXTREMITIES:  Left knee is status post total knee arthroplasty with well-  healed scar, 0-110 degree range of motion, with good stability.  The right  knee shows a varus deformity with 0-120 degree range of motion.  Dorsalis  pedis and posterior tibialis pulses are 1+.  Sensation and circulation  grossly intact.   X-ray shows end-stage osteoarthritis, right knee.   IMPRESSION:  End-stage osteoarthritis, right knee.   PLAN:  Total knee arthroplasty, right knee.     Jaquelyn Bitter. Chabon, P.A.                   Erasmo Leventhal, M.D.    SJC/MEDQ  D:  10/12/2003  T:  10/12/2003  Job:  [id number]

## 2010-06-22 NOTE — Discharge Summary (Signed)
NAME:  Hannah Fischer, BLUCHER NO.:  [id number]   MEDICAL RECORD NO.:  [id number]          PATIENT TYPE:  INP   LOCATION:  0471                         FACILITY:  [facility]   PHYSICIAN:  Erasmo Leventhal, M.D.DATE OF BIRTH:  10-Oct-1932   DATE OF ADMISSION:  10/21/2003  DATE OF DISCHARGE:  10/26/2003                                 DISCHARGE SUMMARY   ADMISSION DIAGNOSIS:  End-stage osteoarthritis, right knee.   DISCHARGE DIAGNOSIS:  End-stage osteoarthritis, right knee.   OPERATION:  Total knee arthroplasty, right knee.   CONSULTATIONS:  Medical consult with Dr. Theone Stanley.   BRIEF HISTORY:  A 77 year old lady well known to [place] with a history previous  left total knee arthroplasty for end-stage osteoarthritis who has done well.  She has end-stage osteoarthritis of her right knee and has failed  conservative management. After discussion of treatment, risk, benefit, and  aftercare, the patient was scheduled for total knee arthroplasty of the  right knee.   LABORATORY DATA:  Admission CBC within normal limits. Hemoglobin and  hematocrit reached a low of 9.6 and 27.9 on September 18. It was up to 10.7  and 31.4 by discharge. Admission PT/INR and PT were within normal limits. PT  was 15.3 with INR 1.3 by discharge. Admission CMET within normal limits with  the exception of elevated glucose and elevated AST at 42. Glucose remained  intermittently elevated throughout admission. She was mildly hyponatremic on  September 18. This was corrected on September 19.   COURSE IN THE HOSPITAL:  The patient tolerated the operative procedure well.  First postoperative day, the patient was doing well. Drain was removed  without difficulty. Dressing was dry. Vital signs were stable. She was  afebrile. She was started on CPM. Medical service evaluated and treated the  patient for a UTI and also managed her hyperglycemia. The second  postoperative day, vital signs were stable. She  was afebrile. Hemoglobin and  hematocrit were in acceptable levels. PT and INR was 13 and 1.0. Dressing  was changed. Wound was benign. Calves were soft. She was continued in  therapy. Foley was discontinued. Third postoperative day, vital signs were  stable. Temperature to a max of 100.4, now afebrile. Wound was benign.  Calves were negative. Overall, she is doing well, and discharge planning was  undertaken. The fourth postoperative day, patient continued to do well.  Vital signs were stable. She was afebrile. Neurovascularly, she was okay.  Negative calves were noted. She was subsequently set up for discharge the  following day. On the fifth postoperative day, vital signs were stable,  afebrile. Wound looking excellent. No calf tenderness. The patient was  discharged home for followup in the office.   CONDITION ON DISCHARGE:  Improved.   DISCHARGE MEDICATIONS:  She will be on Lovenox until her PT/INR are in  therapeutic range, and this was discussed with the husband and also home  health. Also, Percocet, Robaxin, Trinsicon, and Coumadin.   FOLLOW UP:  Followup in the office in 10 days.   DISCHARGE INSTRUCTIONS:  She is to do her home exercise program.  She is to  take the Lovenox until her Coumadin is therapeutic and then stop. Will see  her back in the office in 10 days for recheck or sooner p.r.n.      SJC/MEDQ  D:  11/14/2003  T:  11/15/2003  Job:  [id number]

## 2010-06-22 NOTE — Op Note (Signed)
NAME:  Hannah Fischer, Hannah Fischer                         ACCOUNT NO.:  [id number]   MEDICAL RECORD NO.:  [id number]                   PATIENT TYPE:  AMB   LOCATION:  ENDO                                 FACILITY:  [facility]   PHYSICIAN:  John C. Madilyn Fireman, M.D.                 DATE OF BIRTH:  [birth year]   DATE OF PROCEDURE:  01/11/2003  DATE OF DISCHARGE:                                 OPERATIVE REPORT   PROCEDURE:  Colonoscopy with polypectomy.   INDICATIONS FOR PROCEDURE:  Anemia and diarrhea.   DESCRIPTION OF PROCEDURE:  The patient was placed in the left lateral  decubitus position then placed on the pulse monitor with continuous low flow  oxygen delivered by nasal cannula. She was sedated with 62.5 mcg IV fentanyl  and 6 mg IV Versed. The Olympus video colonoscope was inserted into the  rectum and advanced to the cecum, confirmed by transillumination at  McBurney's point and visualization of the ileocecal valve and appendiceal  orifice. The prep was good. The cecum, ascending, and transverse colon all  appeared normal with no masses, polyps, diverticula or other mucosal  abnormalities. Within the descending and sigmoid colon there were seen a few  scattered diverticula and no other abnormalities.  Biopsies were taken from  the rectosigmoid and rectum to rule out collagenous colitis. At 8 cm from  the anal verge, there was a somewhat irregular 1 1/2 cm polyp on a very  short stalk which was removed by snare and the base appeared to be  completely fulgurated. This was removed and sent in a separate specimen  container from the random rectal biopsies.  The remainder of the rectum  appeared normal. The scope was then withdrawn and the patient returned to  the recovery room in stable condition. She tolerated the procedure well and  there were no immediate complications.   IMPRESSION:  1. Rectal polyp.  2. Diverticulosis.   PLAN:  Await biopsies for evaluation of diarrhea as well as histology on  rectal polyp.                                               John C. Madilyn Fireman, M.D.    JCH/MEDQ  D:  01/11/2003  T:  01/12/2003  Job:  [id number]   cc:   Vikki Ports, M.D.  [address] [place]  [place]  [place] [address]  Fax: [phone]

## 2010-06-22 NOTE — Discharge Summary (Signed)
[facility]  Patient:    Hannah Fischer, Hannah Fischer                  MRN: [id number] Adm. Date:  [id number] Disc. Date: [id number] Attending:  Erasmo Leventhal Dictator:   Della Goo, P.A.                           Discharge Summary  ADMISSION DIAGNOSES: 1. End-stage osteoarthritis left knee. 2. Hypertension. 3. Hypercholesterolemia. 4. Type 2 diabetes mellitus. 5. History of anemia. 6. Irritable bowel syndrome. 7. Hypothyroidism.  DISCHARGE DIAGNOSES: 1. End-stage osteoarthritis left knee. 2. Hypertension. 3. Hypercholesterolemia. 4. Type 2 diabetes mellitus. 5. History of anemia. 6. Irritable bowel syndrome. 7. Hypothyroidism. 8. Posthemorrhagic anemia.  PROCEDURES:  On October 12, 1999, the patient underwent left total knee arthroplasty performed by Dr. Thomasena Edis, assisted by Ralene Bathe, P.A.C., under spinal anesthesia.  CONSULTATIONS:  Dr. Johna Roles of physical medicine and rehabilitation.  HISTORY OF PRESENT ILLNESS:  Hannah Fischer is a 77 year old white female with chronic and progressive left knee pain and dysfunction.  X-rays revealed end-stage osteoarthritis.  She has been refractory to conservative treatment, and it was felt she would require surgical intervention.  She was admitted to undergo the procedure as stated above.  HOSPITAL COURSE:  The patient tolerated the procedure without difficulties. Postoperatively, neurovascular motor function was found to be intact in the lower extremities.  She was placed on total knee replacement protocol for ambulation and gait training as well as range-of-motion and strengthening exercises.  The CPM machine was utilized daily for six to eight hours, advancing 10-15 degrees as tolerated.  She initially utilized PCA analgesics for her discomfort.  She had some difficulty with itching utilizing morphine, and the patient was given Benadryl.  She eventually was weaned from IV analgesics to  p.o. analgesics without difficulty.  The patient was placed on Coumadin for DVT and PE prophylaxis.  Adjustments in her Coumadin dose were made according to daily pro times by the pharmacist at [facility]. The patients dressing was changed, and she was noted to have lateral fracture blisters which were noted to be intact.  There were no signs of acute infection.  She had minimal erythema about the blistered area.  Her Foley catheter was discontinued, and the patient was able to void without difficulty.  Postoperatively, hemoglobin dropped to the lowest value of 9.0, and she did not require blood transfusion.  Wound care was done daily and dressings applied daily.  She was on Keflex orally to prevent any infection about the blistered area of the wound.  A rehabilitation consult was obtained; however, the patient was felt to be advancing quite well, and she would not require inpatient rehabilitation.  On her fourth postoperative day, she was comfortable with p.o. analgesics.  She was afebrile, and vital signs were stable.  She was felt to be stable to be discharged to her home.  PERTINENT LABORATORY DATA:  Admission CBC within normal limits.  Hemoglobin as stated above.  No transfusion was necessary during this admission. Coagulation studies on admission were normal, and elevations were noted while on Coumadin.  Chemistry studies on admission were normal, and a repeat on October 13, 1999, was also normal.  Urinalysis on admission was negative for urinary tract infection.  No chest x-ray on the chart at the time of this dictation.  No EKG on the chart at the time of this dictation.  CONDITION  ON DISCHARGE:  Stable.  PLAN:  The patient will have outpatient physical therapy at [facility] three times per week.  She will have range-of-motion and strengthening exercises.  The patient will utilize her knee immobilizer for ambulation only.  She will change her  dressing daily at home and monitor the wound blister closely.  If she notices any signs of infection, she has been advised to call the office.  DISCHARGE MEDICATIONS: 1. Coumadin 5 mg 1 tablet daily at 6 p.m., except 1-1/2 tablets on Tuesdays    and Fridays. 2. Keflex 1 p.o. q.i.d. for an additional two days after discharge. 3. Robaxin 500 mg 1 every eight hours as needed for spasm. 4. Percocet 1-2 every four to six hours as needed for pain.  FOLLOW-UP:  She will follow up with Dr. Thomasena Edis from the date of her surgery.  DISCHARGE INSTRUCTIONS:  She will resume all of her home medications as taken prior to admission.  She will be on her usual diabetic diet.  The patient and family have been advised to call the office if there are further questions or concerns prior to her return office visit. DD:  11/26/99 TD:  11/27/99 Job: [id number]

## 2010-06-22 NOTE — Op Note (Signed)
NAME:  Hannah Fischer, SKORA NO.:  [id number]   MEDICAL RECORD NO.:  [id number]                   PATIENT TYPE:  INP   LOCATION:  0471                                 FACILITY:  [facility]   PHYSICIAN:  Erasmo Leventhal, M.D.         DATE OF BIRTH:  1932-09-05   DATE OF PROCEDURE:  10/21/2003  DATE OF DISCHARGE:                                 OPERATIVE REPORT   PREOPERATIVE DIAGNOSES:  Right knee end-stage osteoarthritis with varus  malalignment.   POSTOPERATIVE DIAGNOSES:  Right knee end-stage osteoarthritis with varus  malalignment.   PROCEDURE:  Right total knee arthroplasty.   SURGEON:  Erasmo Leventhal, M.D.   ASSISTANT:  Jaquelyn Bitter. Chabon, P.A.   ANESTHESIA:  Spinal epidural.   ESTIMATED BLOOD LOSS:  Less than 50 mL.   DRAINS:  Two medium Hemovac.   COMPLICATIONS:  None.   TOURNIQUET TIME:  85 minutes at 350 mmHg.   IMPLANTS:  Osteonics components, posterior stabilized.  All cemented.  Scorpion. Size 7 femur, size 7 tibia, 15 mm flexed posterior stabilized  tibial insert with a 26 mm polyethylene patella.   DESCRIPTION OF PROCEDURE:  The patient was counseled in the holding area,  correct side was identified, IV started, antibiotics were given.  The chart  was reviewed and signed appropriately, taken to the OR where spinal epidural  was administered. Following this, a Foley catheter was placed utilizing  sterile technique by the OR circulating nurse.   The right knee was examined and five degree flexion contracture was placed  at 120 degrees in varus but the ligaments were stable.  Elevated, prepped  with Duraprep and all draped in a sterile fashion.  Exsanguinated with  esmarch, tourniquet was inflated to 350 mmHg.   A straight midline incision was made through the skin and subcutaneous  tissue, medial and lateral soft tissue flaps were developed at the  appropriate level.  Medial parapatellar arthrotomy was performed and a  medial and proximal medial tissue release was done due to varus alignment.  The patella was everted, knee was flexed, end-stage arthritic changes bone  against bone.  The cruciate ligaments were resected.  A starting hole made  in the distal femur, canal was irrigated, effluent was clear. The  intramedullary rod was gently placed. I chose a 5 degree valgus cut with a  10 mm cut off the distal femur. The distal femur was found to be a size #7,  rotational marks were made for 3 degrees of external rotation. A distal  femoral cutting block was applied and she was fit to cut a size #7. The  medial and lateral menisci removed under direct visualization, geniculate  vessels were coagulated, posterior neurovascular structures were thought of  and protected throughout the entire case. The tibial eminence was resected  as was the osteophytes on the proximal and medial tibia. The proximal tibia  was found to be a size 7,  starting hole was made, step reamer was utilized.  The canal was irrigated, the effluent was clear, appropriate size  intramedullary rod was gently placed and I chose to take a zero degree slope  with a 10 mm cut based upon the lateral side. At this point in time after  that, we measured the cut.  We had to remove it, and took another 2 mm  resection.  Posteromedial and posterofemoral osteophytes removed under  direct visualization. The femoral trochlea prepared in a standard fashion.  At this time, a size 7 femur, size 7 tibia,12 mm flex tibial insert with  excellent range of motion and soft tissue balance and flexion extension,  rotational marks were made.  Delta keel was prepared in a standard fashion.   The patella was found to be a size 26, reamed to a depth of 10 mm, locking  holes were made and excess bone was removed. Utilizing modern cement  technique, all components were cemented in place after a copious lavage with  pulsatile lavage. A size 7 femur, size 7 tibia, 26  patella. After the cement  had cured, we did 12 and 15 mm trials and with the 15 mm trial we were  balanced in flexion and extension.  Patellofemoral tracking was anatomic,  rotation marks were excellent. Alignment was excellent with a well balanced  knee.  The tibial trial was then removed, bone was placed on exposed bony  surfaces, excessive cement was removed and a final 15 mm flexed posterior  stabilized tibial component was implanted.  At this point in time, we had  excellent range of motion, balance and flexion, patellofemoral tracking was  anatomic.  The knee was then copiously irrigated with antibiotic solution,  each layer was irrigated with antibiotic solution during the closure.  Sequential closure of layers was done with arthrotomy Vicryl, subcu Vicryl,  skin closed with staples due to her body habitus with increased subcutaneous  tissue. A sterile compressive dressing applied, tourniquet was deflated, she  had normal circulation of the foot and ankle at the end of the case equal to  her preoperative examination. There was no evidence of ischemia with  excellent capillary refill and circulation equal to her preoperative  evaluation.  Another gram of Ancef was given intravenously after the  tourniquet deflated. Sponge and needle count were correct. No complications.  She was taken from the operating room to PACU in stable condition.   To decrease surgical time and help throughout the entire surgical procedure,  Mr. Brett Canales Chabon's assistance was needed.      RAC/MEDQ  D:  10/21/2003  T:  10/22/2003  Job:  [id number]

## 2010-06-22 NOTE — Op Note (Signed)
[place]  Patient:    Hannah Fischer, Hannah Fischer                      MRN: [id number] Proc. Date: 06/04/99 Adm. Date:  [id number] Disc. Date: [id number] Attending:  Barton Fanny                           Operative Report  PREOPERATIVE DIAGNOSIS:  Right L2-3 and right L3-4 extraforaminal lumbar disk herniation.  POSTOPERATIVE DIAGNOSIS:  Right L2-3 and right L3-4 extraforaminal lumbar disk herniation.  PROCEDURE:  Right L2-3 and L3-4 extraforaminal microdiskectomy with microdissection.  SURGEON:  Hewitt Shorts, M.D.  ASSISTANT:  Mena Goes. Franky Macho, M.D.  ANESTHESIA:  General endotracheal.  INDICATIONS:  The patient is a 77 year old woman who presents with an acute right lumbar radiculopathy which was secondary to disk herniations, actually found at  both right L2-3 and right L3-4.  Tests included significant iliopsoas and quadriceps weakness on the right side.  The decision was made to proceed with extraforaminal microdiskectomy.  DESCRIPTION OF PROCEDURE:  The patient was brought to the operating room and placed under general endotracheal anesthesia.  The patient was turned to the prone position.  The lumbar region was prepped with Betadine soap and solution, and draped in a sterile fashion.  The midline was infiltrated with a local anesthetic with epinephrine.  A localizing x-ray was taken and the L2-3 and L3-4 levels identified.  A midline incision was made and carried down through the subcutaneous tissue with bipolar cautery and electrocautery used to maintain hemostasis. Dissection was carried down to the level of the fascia which was incised on the  right side of the midline and the paraspinal muscle with dissection of the spinous process and lamina in a subperiosteal fashion.  Dissection was carried laterally over the facet and the transverse processes of L3 and L4 were identified.  The microscope was draped and brought in  the field to provide instant magnification, illumination, and visualization.  A localizing x-ray was taken and confirmed the localization, and then we first examined the right L3-4 intertransverse space. The superior aspect of the right L4 transverse process and the lateral aspect of the right L3-4 facet was drilled away using the Midas Rex drill and the intertransverse space and extraforaminal space were explored.  The right L3 nerve root was identified and ventral medial to it was a large free fragment of disk herniation which was freed up from its surrounding tissues and removed.  There was a large  subligamentous disk herniation.  The remaining annular fibers were incised and he disk space entered, and a thorough diskectomy performed, removing all loose fragments of disk material from both the disk space and the extraforaminal space, and with this, we were able to decompress the right L3 nerve roots.  Hemostasis was established with the use of bipolar cautery.  We then explored the right L2-3 intertransverse space, drilled away the superior aspect of the right L3 transverse process, and the lateral aspect of the right 2-3 facet complex.  We dissected down to the right L2-3 disk space within the extraforaminal space.  We identified the right L2 nerve roots.  The disk there as degenerated and bulging, but did not have a herniation.  It did not have a free  fragment.  It was felt that her acute radiculopathy was due to that disk herniation found at the L3-4 extraforaminal space,  and therefore, it was felt that the bony and ligamentous decompression of the right L2 nerve root was sufficient and that entering the disk space would not further benefit the patient.  Hemostasis was again established using bipolar cautery, and then once hemostasis was fully established, 2 cc of fentanyl and 80 mg of Depo-Medrol were infused into the extraforaminal space, spilled between both the  L2-3 and L3-4 levels.  We then proceeded with closure.  The deep fascia was closed with interrupted undyed 0 Vicryl sutures, the subcutaneous and subcuticular were closed with interrupted and inverted 2-0 undyed Vicryl sutures, and the skin edges were approximated with Dermabond.  The patient tolerated the procedure well.  The estimated blood loss was 150 cc.  Sponge and needle counts were correct.  Following surgery, the patient was turned back to the supine position to be reversed from the anesthetic and transferred o the recovery room for further care. DD:  06/04/99 TD:  06/06/99 Job: [id number]

## 2010-06-22 NOTE — Op Note (Signed)
[place]  Patient:    Hannah Fischer, Hannah Fischer Visit Number: [id number] MRN: [id number]          Service Type: Attending:  Thornton Park. Daphine Deutscher, M.D. Dictated by:   Thornton Park Daphine Deutscher, M.D. Proc. Date: 10/27/00   CC:         Tammy R. Collins Scotland, M.D.   Operative Report  CCS# [id number]  PREOPERATIVE DIAGNOSIS:  Recurrent cholecystitis.  POSTOPERATIVE DIAGNOSIS:  Recurrent cholecystitis.  OPERATION PERFORMED:  Laparoscopic cholecystectomy and intraoperative cholangiogram.  SURGEON:  Thornton Park. Daphine Deutscher, M.D.  ASSISTANT:  Donnie Coffin. Samuella Cota, M.D.  ANESTHESIA:  General endotracheal.  DESCRIPTION OF PROCEDURE:  The patient is a 77 year old lady who was taken to room 16 and given general anesthesia.  The abdomen was prepped with Betadine and draped sterilely.  I made an infraumbilical vertical stitch and placed it through the fascia and through two sutures on either to hold the Hasson in place.  I entered the abdomen with the Hasson and insufflated the abdomen. Three trocars were placed in the upper abdomen and using these, I was able to grasp the gallbladder.  The infundibulum was stuck down and these adhesions were taken down with sharp dissection.  Electrocautery was then used with a hook to dissect Calots triangle and I then dissected out Calots triangle and the cystic duct. A clip was placed up on the gallbladder and the cystic duct was incised and fairly dark bile flowed out.  The Reddick catheter was inserted and a dynamic cholangiogram was taken which showed the intrahepatic ducts to be rather dilated but there was a tapering distally and a prompt filling of the duodenum and good flow.  The cystic duct was then triple clipped and divided.  The cystic artery was triple clipped and divided. The gallbladder was removed from the gallbladder bed.  I did enter into one spot but I controlled that drainage and there was no stone spillage.  The gallbladder was   detached from the gallbladder bed and placed in a bag and brought out through the umbilicus.  We reinspected the area and no bleeding or bile leaks were seen.  The umbilicus was then repaired with three sutures using simple sutures of 0 Vicryl using the UR6 needle.  The ladys obesity contributed to some difficulty in this closure but I did place three stitches and it appeared to be well secured in.  The other port sites were not bleeding.  These were withdrawn under direct vision.  The abdomen was deflated.  The wounds were closed with  4-0 Vicryl, benzoin and Steri-Strips. The patient seemed to tolerate the procedure well and was taken to the recovery room in satisfactory condition. Dictated by:   Thornton Park Daphine Deutscher, M.D.  Attending:  Thornton Park. Daphine Deutscher, M.D. DD:  10/27/00 TD:  10/27/00 Job: [id number]

## 2010-07-19 ENCOUNTER — Other Ambulatory Visit: Payer: Self-pay | Admitting: Family Medicine

## 2010-07-19 ENCOUNTER — Ambulatory Visit
Admission: RE | Admit: 2010-07-19 | Discharge: 2010-07-19 | Disposition: A | Discharge status: Home / Self Care | Payer: Medicare Other | Source: Ambulatory Visit | Attending: Family Medicine | Admitting: Family Medicine

## 2010-07-19 DIAGNOSIS — K5792 Diverticulitis of intestine, part unspecified, without perforation or abscess without bleeding: Secondary | ICD-10-CM

## 2010-07-19 MED ORDER — IOHEXOL 300 MG/ML  SOLN
100.0000 mL | Freq: Once | INTRAMUSCULAR | Status: AC | PRN
  Administered 2010-07-19: 100 mL via INTRAVENOUS

## 2010-10-25 ENCOUNTER — Other Ambulatory Visit: Payer: Self-pay | Admitting: Specialist

## 2010-10-25 DIAGNOSIS — M25519 Pain in unspecified shoulder: Secondary | ICD-10-CM

## 2010-10-31 ENCOUNTER — Ambulatory Visit
Admission: RE | Admit: 2010-10-31 | Discharge: 2010-10-31 | Disposition: A | Discharge status: Home / Self Care | Payer: Medicare Other | Source: Ambulatory Visit | Attending: Specialist | Admitting: Specialist

## 2010-10-31 DIAGNOSIS — M25519 Pain in unspecified shoulder: Secondary | ICD-10-CM

## 2010-12-13 NOTE — H&P (Signed)
NAME:  Hannah Fischer, Hannah Fischer NO.:  [id number]  MEDICAL RECORD NO.:  [id number]  LOCATION:  PERIO                        FACILITY:  [facility]  PHYSICIAN:  Burnard Bunting, M.D.    DATE OF BIRTH:  Jul 25, 1932  DATE OF ADMISSION:  12/11/2010 DATE OF DISCHARGE:                             HISTORY & PHYSICAL   CHIEF COMPLAINT:  Bilateral shoulder pain.  HISTORY OF PRESENT ILLNESS:  Hannah Fischer is a 77 year old patient with bilateral shoulder pain.  Right shoulder is worse than the left.  She has had an MRI scans of both shoulders.  She had an injection in the left.  States it helped for a while but the pain came back.  She reports constant pain, sharp, shooting and moves in certain ways, she cannot raise her right arm.  Her right arm is currently more symptomatic than the left.  Both shoulders pop when moving.  The shot helped the pain for about 4-5 months.  It is hard for her to sleep on her shoulders.  She does have a history of diabetes but it is well controlled.  Currently is taking hydrocodone for the problem.  CURRENT MEDICATIONS:  Medications for high blood pressure, diabetes, omeprazole for gastroesophageal reflux disease, thyroxine for thyroid problem and niacin.  ALLERGIES:  SULFUR and CODEINE.  PAST SURGICAL HISTORY:  She had previous gallbladder surgery, knee replacement, cataract surgery, and back surgery.  FAMILY MEDICAL HISTORY:  Father has diabetes, heart disease, high blood pressure.  SOCIAL HISTORY:  The patient is married.  She is here with her husband. Does not smoke or drink.  REVIEW OF SYSTEMS:  All other systems reviewed and negative other than related to the right shoulder.  She does have a history of sleep apnea. No family history of deep vein thrombosis.  PHYSICAL EXAMINATION:  GENERAL:  She is well developed, well nourished in no acute distress.  Alert and oriented.  Normal body mass index. Normal gait and alignment. CHEST:  Clear to  auscultation. HEART:  Regular rate and rhythm. ABDOMEN:  Benign. EXTREMITIES:  She has got good cervical spine range of motion, 5/5 grip EPL, FPL, interosseous, wrist flexion, extension, biceps, triceps, deltoid strength.  Shoulder range of motion is painful but maintains. No real loss of restriction of external rotation, forward flexion or abduction.  She has got symmetric reflexes.  Studies are reviewed, particularly of the right shoulder, which shows glenohumeral arthritis. No rotator cuff tear.  Prominent spurring of the humeral head and glenoid.  Degeneration of much of the labrum.  IMPRESSION:  Right and left shoulder arthritis worse on the right hand side refractory to nonoperative management.  The patient who is having trouble sleeping.  Plan is for a shoulder hemiarthroplasty versus total shoulder replacement.  The risks and benefits were discussed which include, but not limited to incomplete pain relief, infection, dislocation, nerve or vessel damage, possibility of intraoperative fracture based on her age and bone quality.  This is a significant operation.  The patient understands the risks and benefits.  It is similar to how she had her knees replaced.  I think this is a comparable recovery and in general expect about 80% chance  of getting good pain relief from the operation.  Main issue would be restoring power and range of motion.  The patient understands the risk and benefits.  All questions were answered.  She desired to proceed with surgery.     Burnard Bunting, M.D.     GSD/MEDQ  D:  12/13/2010  T:  12/13/2010  Job:  [id number]

## 2010-12-13 NOTE — H&P (Signed)
  Dictated [phone]

## 2010-12-18 ENCOUNTER — Other Ambulatory Visit (HOSPITAL_COMMUNITY): Payer: Medicare Other

## 2010-12-19 ENCOUNTER — Encounter (HOSPITAL_COMMUNITY): Payer: Self-pay | Admitting: Pharmacy Technician

## 2010-12-20 ENCOUNTER — Encounter (HOSPITAL_COMMUNITY)
Admission: RE | Admit: 2010-12-20 | Discharge: 2010-12-20 | Disposition: A | Discharge status: Home / Self Care | Payer: Medicare Other | Source: Ambulatory Visit | Attending: Anesthesiology | Admitting: Anesthesiology

## 2010-12-20 ENCOUNTER — Encounter (HOSPITAL_COMMUNITY): Payer: Self-pay

## 2010-12-20 ENCOUNTER — Encounter (HOSPITAL_COMMUNITY)
Admission: RE | Admit: 2010-12-20 | Discharge: 2010-12-20 | Disposition: A | Discharge status: Home / Self Care | Payer: Medicare Other | Source: Ambulatory Visit | Attending: Orthopedic Surgery | Admitting: Orthopedic Surgery

## 2010-12-20 ENCOUNTER — Other Ambulatory Visit: Payer: Self-pay

## 2010-12-20 HISTORY — DX: Irritable bowel syndrome, unspecified: K58.9

## 2010-12-20 HISTORY — DX: Encounter for other specified aftercare: Z51.89

## 2010-12-20 HISTORY — DX: Essential (primary) hypertension: I10

## 2010-12-20 HISTORY — DX: Gastro-esophageal reflux disease without esophagitis: K21.9

## 2010-12-20 HISTORY — DX: Renal agenesis, unspecified: Q60.2

## 2010-12-20 HISTORY — DX: Other pulmonary embolism without acute cor pulmonale: I26.99

## 2010-12-20 HISTORY — DX: Unspecified convulsions: R56.9

## 2010-12-20 HISTORY — DX: Adverse effect of unspecified anesthetic, initial encounter: T41.45XA

## 2010-12-20 HISTORY — DX: Anemia, unspecified: D64.9

## 2010-12-20 HISTORY — DX: Anxiety disorder, unspecified: F41.9

## 2010-12-20 HISTORY — DX: Type 2 diabetes mellitus with diabetic neuropathy, unspecified: E11.40

## 2010-12-20 HISTORY — DX: Unspecified osteoarthritis, unspecified site: M19.90

## 2010-12-20 HISTORY — DX: Myoneural disorder, unspecified: G70.9

## 2010-12-20 HISTORY — DX: Hyperlipidemia, unspecified: E78.5

## 2010-12-20 HISTORY — DX: Diverticulosis of intestine, part unspecified, without perforation or abscess without bleeding: K57.90

## 2010-12-20 HISTORY — DX: Hypothyroidism, unspecified: E03.9

## 2010-12-20 HISTORY — DX: Other complications of anesthesia, initial encounter: T88.59XA

## 2010-12-20 LAB — DIFFERENTIAL
Basophils Absolute: 0 10*3/uL (ref 0.0–0.1)
Basophils Relative: 1 % (ref 0–1)
Eosinophils Absolute: 0.2 10*3/uL (ref 0.0–0.7)
Eosinophils Relative: 2 % (ref 0–5)
Lymphocytes Relative: 33 % (ref 12–46)
Lymphs Abs: 2.1 10*3/uL (ref 0.7–4.0)
Monocytes Absolute: 0.7 10*3/uL (ref 0.1–1.0)
Monocytes Relative: 10 % (ref 3–12)
Neutro Abs: 3.5 10*3/uL (ref 1.7–7.7)
Neutrophils Relative %: 55 % (ref 43–77)

## 2010-12-20 LAB — CBC
HCT: 36.8 % (ref 36.0–46.0)
Hemoglobin: 11.9 g/dL — ABNORMAL LOW (ref 12.0–15.0)
MCH: 29.7 pg (ref 26.0–34.0)
MCHC: 32.3 g/dL (ref 30.0–36.0)
MCV: 91.8 fL (ref 78.0–100.0)
Platelets: 235 10*3/uL (ref 150–400)
RBC: 4.01 MIL/uL (ref 3.87–5.11)
RDW: 14.8 % (ref 11.5–15.5)
WBC: 6.5 10*3/uL (ref 4.0–10.5)

## 2010-12-20 LAB — URINALYSIS, ROUTINE W REFLEX MICROSCOPIC
Bilirubin Urine: NEGATIVE
Glucose, UA: 250 mg/dL — AB
Hgb urine dipstick: NEGATIVE
Ketones, ur: NEGATIVE mg/dL
Nitrite: POSITIVE — AB
Protein, ur: NEGATIVE mg/dL
Specific Gravity, Urine: 1.018 (ref 1.005–1.030)
Urobilinogen, UA: 0.2 mg/dL (ref 0.0–1.0)
pH: 5.5 (ref 5.0–8.0)

## 2010-12-20 LAB — BASIC METABOLIC PANEL
BUN: 17 mg/dL (ref 6–23)
CO2: 31 mEq/L (ref 19–32)
Calcium: 10.2 mg/dL (ref 8.4–10.5)
Chloride: 100 mEq/L (ref 96–112)
Creatinine, Ser: 0.77 mg/dL (ref 0.50–1.10)
GFR calc Af Amer: >90 mL/min (ref >90)
GFR calc non Af Amer: 80 mL/min — ABNORMAL LOW (ref >90)
Glucose, Bld: 133 mg/dL — ABNORMAL HIGH (ref 70–99)
Potassium: 3.7 mEq/L (ref 3.5–5.1)
Sodium: 142 mEq/L (ref 135–145)

## 2010-12-20 LAB — URINE MICROSCOPIC-ADD ON

## 2010-12-20 LAB — SURGICAL PCR SCREEN
MRSA, PCR: NEGATIVE
Staphylococcus aureus: NEGATIVE

## 2010-12-20 MED ORDER — CHLORHEXIDINE GLUCONATE 4 % EX LIQD: 60.0000 mL | Freq: Once | CUTANEOUS | Status: DC

## 2010-12-20 NOTE — Progress Notes (Signed)
Stress test requested from [facility] cardiology. Pt states it was done this year to evaluate chest pain.

## 2010-12-20 NOTE — Pre-Procedure Instructions (Signed)
20 APRIL COLTER  12/20/2010   Your procedure is scheduled on:  Tuesday, Nov 20  Report to [facility] at 0930 AM.  Call this number if you have problems the morning of surgery: [phone]   Remember:   Do not eat food:After Midnight.  Do not drink clear liquids: 4 Hours before arrival.  Take these medicines the morning of surgery with A SIP OF WATER: Levoxyl,Omeprazole,Gabapentin,Hydrocodone,   Do not wear jewelry, make-up or nail polish.  Do not wear lotions, powders, or perfumes. You may wear deodorant.  Do not shave 48 hours prior to surgery.  Do not bring valuables to the hospital.  Contacts, dentures or bridgework may not be worn into surgery.  Leave suitcase in the car. After surgery it may be brought to your room.  For patients admitted to the hospital, checkout time is 11:00 AM the day of discharge.   Patients discharged the day of surgery will not be allowed to drive home.  Name and phone number of your driver: N/A  Special Instructions: CHG Shower Use Special Wash: 1/2 bottle night before surgery and 1/2 bottle morning of surgery.   Please read over the following fact sheets that you were given: Pain Booklet, Coughing and Deep Breathing, MRSA Information and Surgical Site Infection Prevention

## 2010-12-21 NOTE — Consult Note (Signed)
Anesthesia:  77 year old female for right shoulder replacement.  Hx + HTN, seizures, DM, anemia, hypothyroidism, GERD, PE, and IBS.  I was asked to review her preoperative EKG which shows NSR with LAD and evidence of possible anteroseptal infarct.  She actually had a stress test at [facility] in 04/2010 for CP which was negative with EF 81%.  Left axis deviation is new, but anteroseptal changes appear stable since then.  She has also had an echo in 2008 (under Procedure tab) showing LV systolic function normal with EF 70%, LA size upper limits of normal, AV thickness mildly increased.  A cath (under Notes tab) was done in 2005 showing normal coronaries.  (Per the PAT RN comments, the patient does not recall having a cardiac cath though.)  Labs and CXR also noted.  Her UA is + for leuk, nit, and many bacteria.  Urine cx is pending.  I called the UA results to Tiffany at Dr. Alfonso Patten office to ensure he was aware of the results.

## 2010-12-22 LAB — URINE CULTURE
Colony Count: >=100000
Culture  Setup Time: 201211151113

## 2010-12-24 MED ORDER — CEFAZOLIN SODIUM 1-5 GM-% IV SOLN
1.0000 g | INTRAVENOUS | Status: AC
  Administered 2010-12-25: 1 g via INTRAVENOUS
  Filled 2010-12-24: qty 50

## 2010-12-25 ENCOUNTER — Encounter (HOSPITAL_COMMUNITY): Payer: Self-pay | Admitting: Anesthesiology

## 2010-12-25 ENCOUNTER — Encounter (HOSPITAL_COMMUNITY): Payer: Self-pay | Admitting: Vascular Surgery

## 2010-12-25 ENCOUNTER — Encounter (HOSPITAL_COMMUNITY)
Admission: RE | Disposition: A | Discharge status: Home / Self Care | Payer: Self-pay | Source: Ambulatory Visit | Attending: Orthopedic Surgery

## 2010-12-25 ENCOUNTER — Inpatient Hospital Stay (HOSPITAL_COMMUNITY)
Admission: RE | Admit: 2010-12-25 | Discharge: 2010-12-27 | DRG: 484 | Disposition: A | Discharge status: Home / Self Care | Payer: Medicare Other | Source: Ambulatory Visit | Attending: Orthopedic Surgery | Admitting: Orthopedic Surgery

## 2010-12-25 ENCOUNTER — Encounter (HOSPITAL_COMMUNITY): Payer: Self-pay | Admitting: *Deleted

## 2010-12-25 ENCOUNTER — Inpatient Hospital Stay (HOSPITAL_COMMUNITY): Payer: Medicare Other | Admitting: Vascular Surgery

## 2010-12-25 DIAGNOSIS — Z96659 Presence of unspecified artificial knee joint: Secondary | ICD-10-CM

## 2010-12-25 DIAGNOSIS — K219 Gastro-esophageal reflux disease without esophagitis: Secondary | ICD-10-CM | POA: Diagnosis present

## 2010-12-25 DIAGNOSIS — E079 Disorder of thyroid, unspecified: Secondary | ICD-10-CM | POA: Diagnosis present

## 2010-12-25 DIAGNOSIS — E119 Type 2 diabetes mellitus without complications: Secondary | ICD-10-CM | POA: Diagnosis present

## 2010-12-25 DIAGNOSIS — Z01812 Encounter for preprocedural laboratory examination: Secondary | ICD-10-CM

## 2010-12-25 DIAGNOSIS — S4291XA Fracture of right shoulder girdle, part unspecified, initial encounter for closed fracture: Secondary | ICD-10-CM

## 2010-12-25 DIAGNOSIS — Z7982 Long term (current) use of aspirin: Secondary | ICD-10-CM

## 2010-12-25 DIAGNOSIS — Z9849 Cataract extraction status, unspecified eye: Secondary | ICD-10-CM

## 2010-12-25 DIAGNOSIS — M19019 Primary osteoarthritis, unspecified shoulder: Principal | ICD-10-CM | POA: Diagnosis present

## 2010-12-25 DIAGNOSIS — I10 Essential (primary) hypertension: Secondary | ICD-10-CM | POA: Diagnosis present

## 2010-12-25 DIAGNOSIS — Z882 Allergy status to sulfonamides status: Secondary | ICD-10-CM

## 2010-12-25 HISTORY — PX: SHOULDER HEMI-ARTHROPLASTY: SHX5049

## 2010-12-25 LAB — TYPE AND SCREEN
ABO/RH(D): A POS
Antibody Screen: NEGATIVE

## 2010-12-25 LAB — GLUCOSE, CAPILLARY
Glucose-Capillary: 118 mg/dL — ABNORMAL HIGH (ref 70–99)
Glucose-Capillary: 122 mg/dL — ABNORMAL HIGH (ref 70–99)
Glucose-Capillary: 187 mg/dL — ABNORMAL HIGH (ref 70–99)
Glucose-Capillary: 172 mg/dL — ABNORMAL HIGH (ref 70–99)
Glucose-Capillary: 206 mg/dL — ABNORMAL HIGH (ref 70–99)

## 2010-12-25 LAB — ABO/RH: ABO/RH(D): A POS

## 2010-12-25 SURGERY — HEMIARTHROPLASTY, SHOULDER
Anesthesia: Regional | Site: Shoulder | Laterality: Right | Wound class: Clean

## 2010-12-25 MED ORDER — SODIUM CHLORIDE 0.9 % IR SOLN
Status: DC | PRN
  Administered 2010-12-25 (×3): 1000 mL

## 2010-12-25 MED ORDER — SODIUM CHLORIDE 0.9 % IJ SOLN: 9.0000 mL | INTRAMUSCULAR | Status: DC | PRN

## 2010-12-25 MED ORDER — PHENYLEPHRINE HCL 10 MG/ML IJ SOLN
INTRAMUSCULAR | Status: DC | PRN
  Administered 2010-12-25: 120 ug via INTRAVENOUS
  Administered 2010-12-25: 40 ug via INTRAVENOUS

## 2010-12-25 MED ORDER — METFORMIN HCL 500 MG PO TABS
1000.0000 mg | ORAL_TABLET | Freq: Two times a day (BID) | ORAL | Status: DC
  Administered 2010-12-25 – 2010-12-27 (×4): 1000 mg via ORAL
  Filled 2010-12-25 (×6): qty 2

## 2010-12-25 MED ORDER — PHENYLEPHRINE HCL 10 MG/ML IJ SOLN
10.0000 mg | INTRAMUSCULAR | Status: DC | PRN
  Administered 2010-12-25: 50 ug/min via INTRAVENOUS

## 2010-12-25 MED ORDER — HYDROMORPHONE 0.3 MG/ML IV SOLN
INTRAVENOUS | Status: DC
  Administered 2010-12-25: 7.5 mg via INTRAVENOUS
  Administered 2010-12-26: 0.2 mg via INTRAVENOUS
  Administered 2010-12-26: 3.79 mg via INTRAVENOUS
  Administered 2010-12-26: 3.19 mg via INTRAVENOUS
  Administered 2010-12-26: 1.59 mg via INTRAVENOUS
  Administered 2010-12-27 (×2): 0.799 mg via INTRAVENOUS
  Administered 2010-12-27: 2.59 mg via INTRAVENOUS

## 2010-12-25 MED ORDER — LISINOPRIL 20 MG PO TABS
20.0000 mg | ORAL_TABLET | Freq: Every day | ORAL | Status: DC
  Administered 2010-12-26 – 2010-12-27 (×2): 20 mg via ORAL
  Filled 2010-12-25 (×3): qty 1

## 2010-12-25 MED ORDER — BUPIVACAINE-EPINEPHRINE PF 0.5-1:200000 % IJ SOLN
INTRAMUSCULAR | Status: DC | PRN
  Administered 2010-12-25: 30 mL

## 2010-12-25 MED ORDER — ONDANSETRON HCL 4 MG/2ML IJ SOLN: 4.0000 mg | Freq: Four times a day (QID) | INTRAMUSCULAR | Status: DC | PRN

## 2010-12-25 MED ORDER — ONDANSETRON HCL 4 MG PO TABS: 4.0000 mg | ORAL_TABLET | Freq: Four times a day (QID) | ORAL | Status: DC | PRN

## 2010-12-25 MED ORDER — PANTOPRAZOLE SODIUM 40 MG PO TBEC
40.0000 mg | DELAYED_RELEASE_TABLET | Freq: Every day | ORAL | Status: DC
  Administered 2010-12-26 – 2010-12-27 (×2): 40 mg via ORAL
  Filled 2010-12-25 (×2): qty 1

## 2010-12-25 MED ORDER — WARFARIN SODIUM 5 MG PO TABS
5.0000 mg | ORAL_TABLET | Freq: Once | ORAL | Status: AC
  Administered 2010-12-25: 5 mg via ORAL
  Filled 2010-12-25: qty 1

## 2010-12-25 MED ORDER — EPHEDRINE SULFATE 50 MG/ML IJ SOLN
INTRAMUSCULAR | Status: DC | PRN
  Administered 2010-12-25 (×2): 10 mg via INTRAVENOUS

## 2010-12-25 MED ORDER — INSULIN ASPART 100 UNIT/ML ~~LOC~~ SOLN
0.0000 [IU] | Freq: Three times a day (TID) | SUBCUTANEOUS | Status: DC
  Administered 2010-12-26 (×2): 3 [IU] via SUBCUTANEOUS
  Administered 2010-12-26: 2 [IU] via SUBCUTANEOUS
  Filled 2010-12-25: qty 3

## 2010-12-25 MED ORDER — DIPHENHYDRAMINE HCL 50 MG/ML IJ SOLN: 12.5000 mg | Freq: Four times a day (QID) | INTRAMUSCULAR | Status: DC | PRN

## 2010-12-25 MED ORDER — FENTANYL CITRATE 0.05 MG/ML IJ SOLN
INTRAMUSCULAR | Status: DC | PRN
  Administered 2010-12-25: 50 ug via INTRAVENOUS
  Administered 2010-12-25: 100 ug via INTRAVENOUS
  Administered 2010-12-25: 50 ug via INTRAVENOUS
  Administered 2010-12-25: 25 ug via INTRAVENOUS
  Administered 2010-12-25: 50 ug via INTRAVENOUS

## 2010-12-25 MED ORDER — CEFAZOLIN SODIUM 1-5 GM-% IV SOLN
1.0000 g | Freq: Four times a day (QID) | INTRAVENOUS | Status: AC
  Administered 2010-12-26 (×2): 1 g via INTRAVENOUS
  Filled 2010-12-25 (×3): qty 50

## 2010-12-25 MED ORDER — METOCLOPRAMIDE HCL 10 MG PO TABS: 5.0000 mg | ORAL_TABLET | Freq: Three times a day (TID) | ORAL | Status: DC | PRN

## 2010-12-25 MED ORDER — CHLORHEXIDINE GLUCONATE 4 % EX LIQD: 60.0000 mL | Freq: Once | CUTANEOUS | Status: DC

## 2010-12-25 MED ORDER — LACTATED RINGERS IV SOLN
INTRAVENOUS | Status: DC | PRN
  Administered 2010-12-25 (×3): via INTRAVENOUS

## 2010-12-25 MED ORDER — KCL IN DEXTROSE-NACL 20-5-0.9 MEQ/L-%-% IV SOLN
INTRAVENOUS | Status: AC
  Administered 2010-12-26: 10:00:00 via INTRAVENOUS
  Filled 2010-12-25 (×3): qty 1000

## 2010-12-25 MED ORDER — ALPRAZOLAM 0.25 MG PO TABS
0.2500 mg | ORAL_TABLET | Freq: Every evening | ORAL | Status: DC | PRN
  Administered 2010-12-26: 0.25 mg via ORAL
  Filled 2010-12-25: qty 1

## 2010-12-25 MED ORDER — HYDROMORPHONE HCL PF 1 MG/ML IJ SOLN: 0.2500 mg | INTRAMUSCULAR | Status: DC | PRN

## 2010-12-25 MED ORDER — SODIUM CHLORIDE 0.9 % IV BOLUS (SEPSIS)
250.0000 mL | Freq: Once | INTRAVENOUS | Status: AC
  Administered 2010-12-25: 250 mL via INTRAVENOUS

## 2010-12-25 MED ORDER — GABAPENTIN 100 MG PO CAPS
100.0000 mg | ORAL_CAPSULE | Freq: Three times a day (TID) | ORAL | Status: DC
  Administered 2010-12-26 – 2010-12-27 (×5): 100 mg via ORAL
  Filled 2010-12-25 (×7): qty 1

## 2010-12-25 MED ORDER — GLIPIZIDE ER 10 MG PO TB24
10.0000 mg | ORAL_TABLET | Freq: Every day | ORAL | Status: DC
  Administered 2010-12-26 – 2010-12-27 (×2): 10 mg via ORAL
  Filled 2010-12-25 (×2): qty 1

## 2010-12-25 MED ORDER — METOCLOPRAMIDE HCL 5 MG/ML IJ SOLN
5.0000 mg | Freq: Three times a day (TID) | INTRAMUSCULAR | Status: DC | PRN
  Filled 2010-12-25: qty 2

## 2010-12-25 MED ORDER — CALCIUM CARBONATE-VITAMIN D 500-200 MG-UNIT PO TABS
1.0000 | ORAL_TABLET | Freq: Every day | ORAL | Status: DC
  Administered 2010-12-25 – 2010-12-27 (×3): via ORAL
  Filled 2010-12-25 (×3): qty 1

## 2010-12-25 MED ORDER — ASPIRIN 81 MG PO CHEW
81.0000 mg | CHEWABLE_TABLET | Freq: Every day | ORAL | Status: DC
  Administered 2010-12-26 – 2010-12-27 (×3): 81 mg via ORAL
  Filled 2010-12-25 (×2): qty 1

## 2010-12-25 MED ORDER — LISINOPRIL-HYDROCHLOROTHIAZIDE 20-25 MG PO TABS: 1.0000 | ORAL_TABLET | Freq: Every day | ORAL | Status: DC

## 2010-12-25 MED ORDER — CIPROFLOXACIN IN D5W 400 MG/200ML IV SOLN
INTRAVENOUS | Status: DC | PRN
  Administered 2010-12-25: 400 mg via INTRAVENOUS

## 2010-12-25 MED ORDER — PIOGLITAZONE HCL 45 MG PO TABS
45.0000 mg | ORAL_TABLET | Freq: Every day | ORAL | Status: DC
  Administered 2010-12-25 – 2010-12-27 (×3): 45 mg via ORAL
  Filled 2010-12-25 (×3): qty 1

## 2010-12-25 MED ORDER — SENNOSIDES-DOCUSATE SODIUM 8.6-50 MG PO TABS: 1.0000 | ORAL_TABLET | Freq: Every evening | ORAL | Status: DC | PRN

## 2010-12-25 MED ORDER — GLYCOPYRROLATE 0.2 MG/ML IJ SOLN
INTRAMUSCULAR | Status: DC | PRN
  Administered 2010-12-25: .6 mg via INTRAVENOUS

## 2010-12-25 MED ORDER — COUMADIN BOOK
Freq: Once | Status: DC
  Filled 2010-12-25: qty 1

## 2010-12-25 MED ORDER — ROCURONIUM BROMIDE 100 MG/10ML IV SOLN
INTRAVENOUS | Status: DC | PRN
  Administered 2010-12-25: 50 mg via INTRAVENOUS
  Administered 2010-12-25: 10 mg via INTRAVENOUS

## 2010-12-25 MED ORDER — CIPROFLOXACIN IN D5W 400 MG/200ML IV SOLN
INTRAVENOUS | Status: AC
  Filled 2010-12-25: qty 200

## 2010-12-25 MED ORDER — LEVOTHYROXINE SODIUM 75 MCG PO TABS
75.0000 ug | ORAL_TABLET | Freq: Every day | ORAL | Status: DC
  Administered 2010-12-26 – 2010-12-27 (×2): 75 ug via ORAL
  Filled 2010-12-25 (×3): qty 1

## 2010-12-25 MED ORDER — DIPHENHYDRAMINE HCL 12.5 MG/5ML PO ELIX
12.5000 mg | ORAL_SOLUTION | Freq: Four times a day (QID) | ORAL | Status: DC | PRN
  Filled 2010-12-25: qty 5

## 2010-12-25 MED ORDER — NALOXONE HCL 0.4 MG/ML IJ SOLN: 0.4000 mg | INTRAMUSCULAR | Status: DC | PRN

## 2010-12-25 MED ORDER — ONDANSETRON HCL 4 MG/2ML IJ SOLN: 4.0000 mg | Freq: Once | INTRAMUSCULAR | Status: DC | PRN

## 2010-12-25 MED ORDER — PROPOFOL 10 MG/ML IV EMUL
INTRAVENOUS | Status: DC | PRN
  Administered 2010-12-25: 100 mg via INTRAVENOUS

## 2010-12-25 MED ORDER — ONDANSETRON HCL 4 MG/2ML IJ SOLN
INTRAMUSCULAR | Status: DC | PRN
  Administered 2010-12-25: 4 mg via INTRAVENOUS

## 2010-12-25 MED ORDER — MIDAZOLAM HCL 5 MG/5ML IJ SOLN
INTRAMUSCULAR | Status: DC | PRN
  Administered 2010-12-25: 2 mg via INTRAVENOUS

## 2010-12-25 MED ORDER — NEOSTIGMINE METHYLSULFATE 1 MG/ML IJ SOLN
INTRAMUSCULAR | Status: DC | PRN
  Administered 2010-12-25: 4 mg via INTRAVENOUS

## 2010-12-25 MED ORDER — MEPERIDINE HCL 25 MG/ML IJ SOLN: 6.2500 mg | INTRAMUSCULAR | Status: DC | PRN

## 2010-12-25 MED ORDER — FERROUS SULFATE 325 (65 FE) MG PO TABS
325.0000 mg | ORAL_TABLET | Freq: Every day | ORAL | Status: DC
  Administered 2010-12-26 – 2010-12-27 (×2): 325 mg via ORAL
  Filled 2010-12-25 (×3): qty 1

## 2010-12-25 MED ORDER — CALCIUM-VITAMIN D-VITAMIN K 500-100-40 MG-UNT-MCG PO CHEW: 1.0000 | CHEWABLE_TABLET | Freq: Every day | ORAL | Status: DC

## 2010-12-25 MED ORDER — NIACIN ER 500 MG PO TBCR
500.0000 mg | EXTENDED_RELEASE_TABLET | Freq: Two times a day (BID) | ORAL | Status: DC
  Administered 2010-12-26 – 2010-12-27 (×3): 500 mg via ORAL
  Filled 2010-12-25 (×5): qty 1

## 2010-12-25 MED ORDER — POTASSIUM CHLORIDE CRYS ER 20 MEQ PO TBCR
20.0000 meq | EXTENDED_RELEASE_TABLET | Freq: Every day | ORAL | Status: DC
  Administered 2010-12-26 – 2010-12-27 (×2): 20 meq via ORAL
  Filled 2010-12-25 (×2): qty 1

## 2010-12-25 MED ORDER — HYDROCHLOROTHIAZIDE 25 MG PO TABS
25.0000 mg | ORAL_TABLET | Freq: Every day | ORAL | Status: DC
  Administered 2010-12-26 – 2010-12-27 (×2): 25 mg via ORAL
  Filled 2010-12-25 (×3): qty 1

## 2010-12-25 MED ORDER — WARFARIN VIDEO
Freq: Once | Status: AC
  Administered 2010-12-25: 18:00:00
  Filled 2010-12-25: qty 1

## 2010-12-25 SURGICAL SUPPLY — 81 items
APL SKNCLS STERI-STRIP NONHPOA (GAUZE/BANDAGES/DRESSINGS) ×1
BENZOIN TINCTURE PRP APPL 2/3 (GAUZE/BANDAGES/DRESSINGS) ×2 IMPLANT
BLADE SAW SAG 29X58X.64 (BLADE) ×2 IMPLANT
BLADE SAW SGTL 83.5X18.5 (BLADE) ×2 IMPLANT
BLADE SURG 15 STRL LF DISP TIS (BLADE) ×2 IMPLANT
BLADE SURG 15 STRL SS (BLADE) ×4
CARTRIDGE CURVETEK MED (MISCELLANEOUS) IMPLANT
CARTRIDGE CURVETEK XLRG (MISCELLANEOUS) IMPLANT
CLOTH BEACON ORANGE TIMEOUT ST (SAFETY) ×2 IMPLANT
COVER BACK TABLE 24X17X13 BIG (DRAPES) IMPLANT
COVER SURGICAL LIGHT HANDLE (MISCELLANEOUS) ×2 IMPLANT
DRAPE INCISE IOBAN 66X45 STRL (DRAPES) ×4 IMPLANT
DRAPE ORTHO SPLIT 77X108 STRL (DRAPES)
DRAPE PROXIMA HALF (DRAPES) ×2 IMPLANT
DRAPE SURG ORHT 6 SPLT 77X108 (DRAPES) IMPLANT
DRAPE U-SHAPE 47X51 STRL (DRAPES) ×4 IMPLANT
DRSG PAD ABDOMINAL 8X10 ST (GAUZE/BANDAGES/DRESSINGS) ×4 IMPLANT
DURAPREP 26ML APPLICATOR (WOUND CARE) ×2 IMPLANT
ELECT REM PT RETURN 9FT ADLT (ELECTROSURGICAL) ×2
ELECTRODE REM PT RTRN 9FT ADLT (ELECTROSURGICAL) ×1 IMPLANT
GAUZE XEROFORM 1X8 LF (GAUZE/BANDAGES/DRESSINGS) ×2 IMPLANT
GLOVE BIOGEL PI IND STRL 6.5 (GLOVE) ×1 IMPLANT
GLOVE BIOGEL PI IND STRL 7.0 (GLOVE) ×3 IMPLANT
GLOVE BIOGEL PI IND STRL 8 (GLOVE) ×2 IMPLANT
GLOVE BIOGEL PI INDICATOR 6.5 (GLOVE) ×1
GLOVE BIOGEL PI INDICATOR 7.0 (GLOVE) ×3
GLOVE BIOGEL PI INDICATOR 8 (GLOVE) ×2
GLOVE EXAM NITRILE LRG STRL (GLOVE) ×2 IMPLANT
GLOVE SURG ORTHO 8.0 STRL STRW (GLOVE) ×2 IMPLANT
GLOVE SURG SS PI 6.5 STRL IVOR (GLOVE) ×4 IMPLANT
GLOVE SURG SS PI 7.5 STRL IVOR (GLOVE) ×4 IMPLANT
GLOVE SURGEON 8 (GLOVE) ×2 IMPLANT
GOWN PREVENTION PLUS LG XLONG (DISPOSABLE) ×2 IMPLANT
GOWN PREVENTION PLUS XLARGE (GOWN DISPOSABLE) ×8 IMPLANT
GOWN STRL NON-REIN LRG LVL3 (GOWN DISPOSABLE) ×4 IMPLANT
GOWN W/COTTON TOWEL STD LRG (GOWNS) ×2 IMPLANT
HANDPIECE INTERPULSE COAX TIP (DISPOSABLE)
KIT BASIN OR (CUSTOM PROCEDURE TRAY) ×2 IMPLANT
KIT ROOM TURNOVER OR (KITS) ×2 IMPLANT
LOOP VESSEL MAXI BLUE (MISCELLANEOUS) IMPLANT
MANIFOLD NEPTUNE II (INSTRUMENTS) ×2 IMPLANT
NDL SUT 6 .5 CRC .975X.05 MAYO (NEEDLE) ×1 IMPLANT
NEEDLE 1/2 CIR CATGUT .05X1.09 (NEEDLE) ×2 IMPLANT
NEEDLE HYPO 25GX1X1/2 BEV (NEEDLE) ×2 IMPLANT
NEEDLE MAYO TAPER (NEEDLE) ×2
NS IRRIG 1000ML POUR BTL (IV SOLUTION) ×6 IMPLANT
PACK SHOULDER (CUSTOM PROCEDURE TRAY) ×2 IMPLANT
PAD ARMBOARD 7.5X6 YLW CONV (MISCELLANEOUS) ×2 IMPLANT
PASSER SUT SWANSON 36MM LOOP (INSTRUMENTS) IMPLANT
PIN STEINMANN THREADED TIP (PIN) ×2 IMPLANT
SET HNDPC FAN SPRY TIP SCT (DISPOSABLE) IMPLANT
SLING ARM FOAM STRAP LRG (SOFTGOODS) ×2 IMPLANT
SPONGE GAUZE 4X4 12PLY (GAUZE/BANDAGES/DRESSINGS) ×2 IMPLANT
SPONGE LAP 18X18 X RAY DECT (DISPOSABLE) ×2 IMPLANT
SPONGE LAP 4X18 X RAY DECT (DISPOSABLE) ×2 IMPLANT
STAPLER VISISTAT 35W (STAPLE) ×2 IMPLANT
STRIP CLOSURE SKIN 1/2X4 (GAUZE/BANDAGES/DRESSINGS) ×4 IMPLANT
SUCTION FRAZIER TIP 10 FR DISP (SUCTIONS) ×2 IMPLANT
SUT ETHIBOND 2 V 37 (SUTURE) IMPLANT
SUT ETHIBOND NAB CT1 #1 30IN (SUTURE) ×2 IMPLANT
SUT FIBERWIRE #2 38 T-5 BLUE (SUTURE) ×8
SUT FIBERWIRE #5 38 CONV NDL (SUTURE)
SUT MERSILENE 5MM BP 1 12 (SUTURE) IMPLANT
SUT PROLENE 3 0 PS 1 (SUTURE) ×2 IMPLANT
SUT VIC AB 0 CT1 27 (SUTURE) ×2
SUT VIC AB 0 CT1 27XBRD ANBCTR (SUTURE) ×1 IMPLANT
SUT VIC AB 0 CTB1 27 (SUTURE) ×4 IMPLANT
SUT VIC AB 1 CT1 27 (SUTURE) ×6
SUT VIC AB 1 CT1 27XBRD ANBCTR (SUTURE) ×3 IMPLANT
SUT VIC AB 2-0 CT1 27 (SUTURE) ×2
SUT VIC AB 2-0 CT1 TAPERPNT 27 (SUTURE) ×1 IMPLANT
SUT VIC AB 2-0 CTB1 (SUTURE) ×2 IMPLANT
SUT VICRYL AB 2 0 TIES (SUTURE) IMPLANT
SUTURE FIBERWR #2 38 T-5 BLUE (SUTURE) ×4 IMPLANT
SUTURE FIBERWR #5 38 CONV NDL (SUTURE) IMPLANT
SYR CONTROL 10ML LL (SYRINGE) ×2 IMPLANT
TOWEL OR 17X24 6PK STRL BLUE (TOWEL DISPOSABLE) ×2 IMPLANT
TOWEL OR 17X26 10 PK STRL BLUE (TOWEL DISPOSABLE) ×2 IMPLANT
TOWER CARTRIDGE SMART MIX (DISPOSABLE) IMPLANT
TRAY FOLEY CATH 14FR (SET/KITS/TRAYS/PACK) ×2 IMPLANT
WATER STERILE IRR 1000ML POUR (IV SOLUTION) IMPLANT

## 2010-12-25 NOTE — Interval H&P Note (Signed)
History and Physical Interval Note:   12/25/2010   10:51 AM   Hannah Fischer  has presented today for surgery, with the diagnosis of right shoulder osteoarthritis  The various methods of treatment have been discussed with the patient and family. After consideration of risks, benefits and other options for treatment, the patient has consented to  Procedure(s): SHOULDER HEMI-ARTHROPLASTY as a surgical intervention .  The patients' history has been reviewed, patient examined, no change in status, stable for surgery.  I have reviewed the patients' chart and labs.  Questions were answered to the patient's satisfaction.   Pt has possible uti although not currently symptomatic - will add cipro/and ancef as preop abx - wbc less than 10 k - no fevers - minimal new dysuria sxs  Hannah Copa  MD

## 2010-12-25 NOTE — Op Note (Signed)
NAMEMELLIE, BUCCELLATO NO.:  [id number]  MEDICAL RECORD NO.:  [id number]  LOCATION:  5035                         FACILITY:  [facility]  PHYSICIAN:  Burnard Bunting, M.D.    DATE OF BIRTH:  09-21-1932  DATE OF PROCEDURE:  12/25/2010 DATE OF DISCHARGE:                              OPERATIVE REPORT   PREOPERATIVE DIAGNOSIS:  Right shoulder arthritis.  POSTOPERATIVE DIAGNOSIS:  Right shoulder arthritis.  PROCEDURE:  Right shoulder hemiarthroplasty.  SURGEON:  Burnard Bunting, MD.  ASSISTANT:  Wende Neighbors, PA.  ANESTHESIA:  General endotracheal.  ESTIMATED BLOOD LOSS:  150.  DRAINS:  None.  INDICATION:  Vicenta Olds is a patient with right shoulder arthritis, who presents for shoulder hemiarthroplasty after explanation of risks and benefits.  IMPLANTS USED:  Biomet size-11 stem press-fit, 50 x 21 head with level C offset.  PROCEDURE IN DETAIL:  The patient was brought to the operating room where general endotracheal anesthesia was induced.  Preoperative antibiotics were administered.  The patient was placed in a beach-chair position with the head in neutral position.  Time-out was called.  Right shoulder was prescrubbed with alcohol and Betadine scrub which was allowed to air dry, prepped with DuraPrep solution and draped in a sterile manner.  Collier Flowers was used to cover the operative field including axilla and a deltopectoral approach was made.  Skin and subcutaneous tissue were sharply divided.  A cephalic vein was mobilized laterally. Self-retaining retractor was placed.  Axillary nerve was palpated and protected at all times during the case.  The bicipital groove was incised all the way up in the rotator interval, was opened. Subscapularis was then detached about __________tendinous junction about a centimeter away from the lesser tuberosity.  This was tagged for later reattachment.  The capsule was excised.  Head was exposed.  Rotator cuff was intact.   Biceps tendon was released.  The center of the head was then opened with a rongeur just lateral to the articular margin medial to the rotator cuff insertion.  This area was then expanded with reamers up to size 11 by hand.  The neck cut was then made along the anatomic neck in 30 degrees of retroversion.  Rotator cuff and axillary nerve were protected during this.  At this time, broaching was performed up to size 11 with a good fit obtained.  A 50 x 21 head was then placed with good coverage.  This was a good match based on the patient's native head cut.  The patient had about 50% inferior mobility.  The patient was stable anteriorly and posteriorly and her arm could be moved above her head onto her stomach without any instability.  Trial components were removed.  True components were placed.  The true components sat about 1- 2 mm prouder than the trial components.  This did not adversely affect the final seating of the components.  The humeral head was about 7-8 mm superior to the tip of the greater tuberosity.  At this time, following implantation of the true components, joint was thoroughly irrigated. Glenoid itself had some wear, but was not found to bone on bone.  At this time, the subscap  was repaired using 5 #2 FiberWire sutures and 5 #1 Vicryl sutures.  The deltopectoral split was then closed using a running 0 Vicryl.  Skin was closed using interrupted inverted 0 Vicryl and running 3-0 pullout Prolene.  The patient tolerated the procedure well without any immediate complication.  She was transferred to the recovery room after being placed in a sling.  Velna Hatchet Vernon's assistance was required at all times during the case for retraction of important neurovascular structures, opening and closing, her assistance was of medical necessity.     Burnard Bunting, M.D.     GSD/MEDQ  D:  12/25/2010  T:  12/25/2010  Job:  [id number]

## 2010-12-25 NOTE — Anesthesia Procedure Notes (Addendum)
Anesthesia Regional Block:  Interscalene brachial plexus block  Pre-Anesthetic Checklist: ,, timeout performed, Correct Patient, Correct Site, Correct Laterality, Correct Procedure, Correct Position, site marked, Risks and benefits discussed, Surgical consent,  Pre-op evaluation,  At surgeon's request  Laterality: Right  Prep: chloraprep       Needles:  Injection technique: Single-shot  Needle Type: Echogenic Stimulator Needle     Needle Length: 5cm 5 cm     Additional Needles:  Procedures: ultrasound guided and nerve stimulator Interscalene brachial plexus block  Nerve Stimulator or Paresthesia:  Response: 0.4 mA,   Additional Responses:   Narrative:  Start time: 12/25/2010 10:50 AM End time: 12/25/2010 11:05 AM  Performed by: Personally  Anesthesiologist: Joice Lofts MD   Procedure Name: Intubation Date/Time: 12/25/2010 11:23 AM Performed by: Elon Alas Pre-anesthesia Checklist: Patient identified, Timeout performed, Emergency Drugs available, Suction available and Patient being monitored Patient Re-evaluated:Patient Re-evaluated prior to inductionOxygen Delivery Method: Circle System Utilized Preoxygenation: Pre-oxygenation with 100% oxygen Intubation Type: IV induction Ventilation: Mask ventilation without difficulty Laryngoscope Size: Mac and 3 Grade View: Grade I Tube type: Oral Tube size: 7.0 mm Airway Equipment and Method: stylet Placement Confirmation: positive ETCO2,  ETT inserted through vocal cords under direct vision and breath sounds checked- equal and bilateral Secured at: 21 cm Tube secured with: Tape Dental Injury: Teeth and Oropharynx as per pre-operative assessment

## 2010-12-25 NOTE — Brief Op Note (Signed)
12/25/2010  2:35 PM  PATIENT:  Hannah Fischer  77 y.o. female  PRE-OPERATIVE DIAGNOSIS:  right shoulder osteoarthritis  POST-OPERATIVE DIAGNOSIS:  right shoulder osteoarthritis  PROCEDURE:  Procedure(s): SHOULDER HEMI-ARTHROPLASTY right  SURGEON:  Surgeon(s): Corrie Mckusick Dean  ASSISTANT: Maud Deed  ANESTHESIA:   regional and general  EBL: 150 ml    Total I/O In: 2000 [I.V.:2000] Out: 700 [Urine:500; Blood:200]  BLOOD ADMINISTERED: none  DRAINS: none   LOCAL MEDICATIONS USED:  none  SPECIMEN:  No Specimen  COUNTS:  YES  TOURNIQUET:  * No tourniquets in log *  DICTATION: .Other Dictation: Dictation Number [id number]  PLAN OF CARE: Admit to inpatient   PATIENT DISPOSITION:  PACU - hemodynamically stable

## 2010-12-25 NOTE — Preoperative (Signed)
Beta Blockers   Reason not to administer Beta Blockers:Not Applicable 

## 2010-12-25 NOTE — Transfer of Care (Signed)
Immediate Anesthesia Transfer of Care Note  Patient: Hannah Fischer  Procedure(s) Performed:  SHOULDER HEMI-ARTHROPLASTY  Patient Location: PACU  Anesthesia Type: General  Level of Consciousness: awake, alert , oriented and sedated  Airway & Oxygen Therapy: Patient Spontanous Breathing and Patient connected to nasal cannula oxygen  Post-op Assessment: Report given to PACU RN, Post -op Vital signs reviewed and stable, Patient moving all extremities and Patient moving all extremities X 4  Post vital signs: Reviewed and stable  Complications: No apparent anesthesia complications

## 2010-12-25 NOTE — Anesthesia Preprocedure Evaluation (Addendum)
Anesthesia Evaluation  Patient identified by MRN, date of birth, ID band  Reviewed: Allergy & Precautions, H&P , NPO status , Patient's Chart, lab work & pertinent test results  History of Anesthesia Complications (+) Family history of anesthesia reactionHistory of anesthetic complications: family hx of delayed emergence.  Airway Mallampati: III TM Distance: >3 FB Neck ROM: Full    Dental  (+) Upper Dentures, Lower Dentures and Dental Advisory Given   Pulmonary  Hx PE         Cardiovascular hypertension, Pt. on medications + angina     Neuro/Psych Seizures - (77 years old),     GI/Hepatic Neg liver ROS, GERD-  Medicated,  Endo/Other  Diabetes mellitus-, Well Controlled, Type 2, Oral Hypoglycemic AgentsHypothyroidism   Renal/GU Kidney agenesis     Musculoskeletal  (+) Arthritis -,   Abdominal   Peds  Hematology   Anesthesia Other Findings   Reproductive/Obstetrics                        Anesthesia Physical Anesthesia Plan  ASA: II  Anesthesia Plan: General and Regional   Post-op Pain Management: MAC Combined w/ Regional for Post-op pain   Induction: Intravenous  Airway Management Planned: Oral ETT  Additional Equipment:   Intra-op Plan:   Post-operative Plan: Extubation in OR  Informed Consent: I have reviewed the patients History and Physical, chart, labs and discussed the procedure including the risks, benefits and alternatives for the proposed anesthesia with the patient or authorized representative who has indicated his/her understanding and acceptance.     Plan Discussed with: CRNA and Surgeon  Anesthesia Plan Comments:         Anesthesia Quick Evaluation

## 2010-12-25 NOTE — Progress Notes (Signed)
ANTICOAGULATION CONSULT NOTE - Initial Consult  Pharmacy Consult for Warfarin Indication: VTE prophylaxis  Allergies  Allergen Reactions  . Sulfa Antibiotics Other (See Comments)    Gastric intolerance  . Sulfites Other (See Comments)    Abdominal pain  . Codeine Anxiety     Vital Signs: Temp: 97 F (36.1 C) (11/20 1637) Temp src: Oral (11/20 0953) BP: 101/45 mmHg (11/20 1636) Pulse Rate: 78  (11/20 1636)   Medical History: Past Medical History  Diagnosis Date  . Hypertension     on medication  . Seizures     once, age 33  . Complication of anesthesia     unable to breathe lying  flat on back  . Diabetes mellitus     type 2  niddm x 25 yrs  . Blood transfusion 1979    for anemia  . Anemia     chronic  . Hypothyroidism     takes levoxyl  . Arthritis     djd  . 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 Anxiety   . GERD (gastroesophageal reflux disease)   . Hyperlipemia   . Chest pain     evaluated @ [facility] cardiology; had a stress test 2012  . Diverticular disease   . Irritable bowel syndrome   . Neuromuscular disorder   . Neuropathy in diabetes   . Pulmonary embolism 06-2010    bilateral    Medications:  Prescriptions prior to admission  Medication Sig Dispense Refill  . ALPRAZolam (XANAX) 0.25 MG tablet Take 0.25 mg by mouth at bedtime as needed.        Marland Kitchen aspirin 81 MG tablet Take 81 mg by mouth daily.        . Calcium-Vitamin D-Vitamin K (VIACTIV) 500-100-40 MG-UNT-MCG CHEW Chew 1 tablet by mouth daily.        . Cyanocobalamin (VITAMIN B-12 PO) Take 1 tablet by mouth daily.        . ferrous sulfate 325 (65 FE) MG tablet Take 325 mg by mouth daily with breakfast.        . gabapentin (NEURONTIN) 100 MG capsule Take 100 mg by mouth 3 (three) times daily.        Marland Kitchen glipiZIDE (GLUCOTROL XL) 10 MG 24 hr tablet Take 10 mg by mouth daily.        Marland Kitchen HYDROcodone-acetaminophen (VICODIN) 5-500 MG per tablet Take 1 tablet by mouth every 6 (six) hours as  needed.        Marland Kitchen levothyroxine (SYNTHROID, LEVOTHROID) 75 MCG tablet Take 75 mcg by mouth daily.        Marland Kitchen lisinopril-hydrochlorothiazide (PRINZIDE,ZESTORETIC) 20-25 MG per tablet Take 1 tablet by mouth daily.        . metFORMIN (GLUCOPHAGE) 1000 MG tablet Take 1,000 mg by mouth 2 (two) times daily with a meal.        . niacin (SLO-NIACIN) 500 MG tablet Take 500 mg by mouth 2 (two) times daily with a meal.        . OMEGA 3 1000 MG CAPS Take 1 capsule by mouth 4 (four) times daily.        Marland Kitchen omeprazole (PRILOSEC) 20 MG capsule Take 20 mg by mouth daily.        . pioglitazone (ACTOS) 45 MG tablet Take 45 mg by mouth daily.        . potassium chloride SA (K-DUR,KLOR-CON) 20 MEQ tablet Take 20 mEq by mouth daily.  Assessment: 77 year old woman s/p shoulder surgery to start on Warfarin for VTE prophylaxis.  She has a history of bilateral PE. Goal of Therapy:  INR 2-3   Plan:  Warfarin 5mg  x 1 today.  Daily protimes.  Mickeal Skinner 12/25/2010,5:52 PM

## 2010-12-25 NOTE — Progress Notes (Signed)
Pt has been complaining of difficulty breathing for past 30-45 minutes despite normal vital signs.  Pt received intrascalene block for right shoulder hemi-arthroplasty.   Breath sounds clear throughout per assessment, and heart rate normal.  Pt has been eating ice chips to assist with throat.  Called Dr. Michelle Piper, who came to bedside and explained to pt that Side effects of block include the cough and the feeling she is experiencing in her throat/neck.

## 2010-12-26 ENCOUNTER — Encounter (HOSPITAL_COMMUNITY): Payer: Self-pay | Admitting: Orthopedic Surgery

## 2010-12-26 LAB — CBC
HCT: 26.8 % — ABNORMAL LOW (ref 36.0–46.0)
Hemoglobin: 8.8 g/dL — ABNORMAL LOW (ref 12.0–15.0)
MCH: 29.8 pg (ref 26.0–34.0)
MCHC: 32.8 g/dL (ref 30.0–36.0)
MCV: 90.8 fL (ref 78.0–100.0)
Platelets: 143 10*3/uL — ABNORMAL LOW (ref 150–400)
RBC: 2.95 MIL/uL — ABNORMAL LOW (ref 3.87–5.11)
RDW: 15.1 % (ref 11.5–15.5)
WBC: 5 10*3/uL (ref 4.0–10.5)

## 2010-12-26 LAB — BASIC METABOLIC PANEL
BUN: 14 mg/dL (ref 6–23)
CO2: 26 mEq/L (ref 19–32)
Calcium: 8.5 mg/dL (ref 8.4–10.5)
Chloride: 99 mEq/L (ref 96–112)
Creatinine, Ser: 0.73 mg/dL (ref 0.50–1.10)
GFR calc Af Amer: >90 mL/min (ref >90)
GFR calc non Af Amer: 82 mL/min — ABNORMAL LOW (ref >90)
Glucose, Bld: 231 mg/dL — ABNORMAL HIGH (ref 70–99)
Potassium: 4.3 mEq/L (ref 3.5–5.1)
Sodium: 134 mEq/L — ABNORMAL LOW (ref 135–145)

## 2010-12-26 LAB — PROTIME-INR
INR: 1.09 (ref 0.00–1.49)
Prothrombin Time: 14.3 seconds (ref 11.6–15.2)

## 2010-12-26 LAB — GLUCOSE, CAPILLARY
Glucose-Capillary: 171 mg/dL — ABNORMAL HIGH (ref 70–99)
Glucose-Capillary: 149 mg/dL — ABNORMAL HIGH (ref 70–99)
Glucose-Capillary: 158 mg/dL — ABNORMAL HIGH (ref 70–99)
Glucose-Capillary: 124 mg/dL — ABNORMAL HIGH (ref 70–99)

## 2010-12-26 MED ORDER — HYDROMORPHONE 0.3 MG/ML IV SOLN
INTRAVENOUS | Status: AC
  Administered 2010-12-26: 1.97 mg via INTRAVENOUS
  Filled 2010-12-26: qty 25

## 2010-12-26 MED ORDER — WARFARIN SODIUM 5 MG PO TABS
5.0000 mg | ORAL_TABLET | Freq: Once | ORAL | Status: AC
  Administered 2010-12-26: 5 mg via ORAL
  Filled 2010-12-26: qty 1

## 2010-12-26 NOTE — Progress Notes (Signed)
Occupational Therapy Shoulder Evaluation Evaluation completed and filed in ghost chart.  Will follow pt acutely. Recommend HHOT for d/c home. No equipment recommendations. Plan to see pt for at least one more session to ensure pt's independence with ADLs, sling education, and PROM exercises with assistance from spouse as discussed during initial evaluation. 12/26/2010 Cipriano Mile OTR/L Pager [phone] Office [phone]

## 2010-12-26 NOTE — Progress Notes (Signed)
Subjective: Pt stable - pain controlled on iv pain meds  Objective: Vital signs in last 24 hours: Temp:  [97 F (36.1 C)-98.8 F (37.1 C)] 98.5 F (36.9 C) (11/21 0620) Pulse Rate:  [72-91] 84  (11/21 0620) Resp:  [11-22] 20  (11/21 0620) BP: (83-125)/(36-70) 102/62 mmHg (11/21 0620) SpO2:  [98 %-100 %] 98 % (11/21 0620) FiO2 (%):  [2 %] 2 % (11/21 0620)  Intake/Output from previous day: 11/20 0701 - 11/21 0700 In: 2250 [I.V.:2200; IV Piggyback:50] Out: 1900 [Urine:1700; Blood:200] Intake/Output this shift:    Exam:  dressing dry - r hand mobile perfused sensate rad 2/4  Labs:  Basename 12/26/10 0100  HGB 8.8*    Basename 12/26/10 0100  WBC 5.0  RBC 2.95*  HCT 26.8*  PLT 143*    Basename 12/26/10 0100  NA 134*  K 4.3  CL 99  CO2 26  BUN 14  CREATININE 0.73  GLUCOSE 231*  CALCIUM 8.5    Basename 12/26/10 0107  LABPT --  INR 1.09    Assessment/Plan: Pt stable - hgb 8.8 recheck am - begin pt today - possible release am - oob to chair   Hannah Fischer,Hannah Fischer 12/26/2010, 9:26 AM

## 2010-12-26 NOTE — Anesthesia Postprocedure Evaluation (Signed)
  Anesthesia Post-op Note  Patient: Hannah Fischer  Procedure(s) Performed:  SHOULDER HEMI-ARTHROPLASTY  Patient Location: PACU and Nursing Unit  Anesthesia Type: General and GA combined with regional for post-op pain  Level of Consciousness: awake, alert  and oriented  Airway and Oxygen Therapy: Patient Spontanous Breathing  Post-op Pain: mild  Post-op Assessment: Post-op Vital signs reviewed, Patient's Cardiovascular Status Stable, Respiratory Function Stable, Patent Airway, No signs of Nausea or vomiting and Adequate PO intake  Post-op Vital Signs: Reviewed and stable  Complications: No apparent anesthesia complications

## 2010-12-26 NOTE — Progress Notes (Signed)
ANTICOAGULATION CONSULT NOTE -follow up Pharmacy Consult for Warfarin Indication: VTE prophylaxis s/p shoulder repair, hx B PE 2008  Assessment: 77 year old woman s/p shoulder surgery to receive warfarin for VTE prophylaxis.  She has a history of bilateral PE in 2008. She does not remember doses.  She was taking fish oil PTA.  Goal of Therapy:  INR 2-3   Plan:  Warfarin 5mg  x 1 today.  Daily protimes. Education done. I told her not to resume fish oil until coumadin completed unless instructed by MD.  Len Childs T 12/26/2010,11:38 AM

## 2010-12-27 ENCOUNTER — Inpatient Hospital Stay (HOSPITAL_COMMUNITY): Payer: Medicare Other

## 2010-12-27 ENCOUNTER — Other Ambulatory Visit: Payer: Self-pay | Admitting: Orthopedic Surgery

## 2010-12-27 LAB — CBC
HCT: 27.5 % — ABNORMAL LOW (ref 36.0–46.0)
Hemoglobin: 8.8 g/dL — ABNORMAL LOW (ref 12.0–15.0)
MCH: 29.8 pg (ref 26.0–34.0)
MCHC: 32 g/dL (ref 30.0–36.0)
MCV: 93.2 fL (ref 78.0–100.0)
Platelets: 156 10*3/uL (ref 150–400)
RBC: 2.95 MIL/uL — ABNORMAL LOW (ref 3.87–5.11)
RDW: 15.4 % (ref 11.5–15.5)
WBC: 4.9 10*3/uL (ref 4.0–10.5)

## 2010-12-27 LAB — PROTIME-INR
INR: 1.04 (ref 0.00–1.49)
Prothrombin Time: 13.8 seconds (ref 11.6–15.2)

## 2010-12-27 LAB — GLUCOSE, CAPILLARY: Glucose-Capillary: 95 mg/dL (ref 70–99)

## 2010-12-27 MED ORDER — WARFARIN SODIUM 5 MG PO TABS
5.0000 mg | ORAL_TABLET | Freq: Once | ORAL | Status: AC
  Administered 2010-12-27: 5 mg via ORAL
  Filled 2010-12-27: qty 1

## 2010-12-27 MED ORDER — WARFARIN SODIUM 5 MG PO TABS: 5.0000 mg | ORAL_TABLET | Freq: Every day | ORAL | Status: DC

## 2010-12-27 MED ORDER — HYDROMORPHONE 0.3 MG/ML IV SOLN
INTRAVENOUS | Status: AC
  Administered 2010-12-27: 7.5 mg
  Filled 2010-12-27: qty 25

## 2010-12-27 MED ORDER — IOHEXOL 300 MG/ML  SOLN
100.0000 mL | Freq: Once | INTRAMUSCULAR | Status: AC | PRN
  Administered 2010-12-27: 100 mL via INTRAVENOUS

## 2010-12-27 MED ORDER — OXYCODONE-ACETAMINOPHEN 5-325 MG PO TABS: 1.0000 | ORAL_TABLET | ORAL | Status: AC | PRN

## 2010-12-27 MED ORDER — ENOXAPARIN SODIUM 40 MG/0.4ML ~~LOC~~ SOLN: 40.0000 mg | SUBCUTANEOUS | Status: AC

## 2010-12-27 MED ORDER — OXYCODONE-ACETAMINOPHEN 5-325 MG PO TABS
1.0000 | ORAL_TABLET | ORAL | Status: DC | PRN
  Administered 2010-12-27 (×2): 1 via ORAL
  Filled 2010-12-27 (×2): qty 1

## 2010-12-27 MED ORDER — ENOXAPARIN SODIUM 40 MG/0.4ML ~~LOC~~ SOLN
40.0000 mg | SUBCUTANEOUS | Status: DC
  Administered 2010-12-27: 40 mg via SUBCUTANEOUS
  Filled 2010-12-27: qty 0.4

## 2010-12-27 NOTE — Progress Notes (Signed)
Pt with h/o pe - will check chest ct to r/p dvt and add lovenox per pharmacy inr not yet likely therapeutic  - continue with ot todayfor shoulder rom

## 2010-12-27 NOTE — Progress Notes (Signed)
Occupational Therapy Treatment Patient Details Name: Hannah Fischer MRN: [id number] DOB: 01-07-1933 Today's Date: 12/27/2010  OT Assessment/Plan OT Assessment/Plan OT Plan: Discharge plan remains appropriate OT Goals  Patient is progressing towards goals.  OT Treatment Precautions/Restrictions  Precautions Precautions: Shoulder Type of Shoulder Precautions: right. NWB right shoulder. sling to rt. shoulder Required Braces or Orthoses: Yes Restrictions Weight Bearing Restrictions: Yes   ADL ADL ADL Comments: educated patient on UB dressing and bathing adaptations as well as don/doffing of sling. husband present for education. patient has tub/shower bench and will use this to transfer into/out of shower at home with supervision of husband. Mobility    Exercises Shoulder Exercises Pendulum Exercise: AROM;Right;10 reps;Standing (1 set) Shoulder Flexion: PROM;Right;10 reps;Seated;Limitations (to 90degrees. educated husband to perform this) Shoulder ABduction: PROM;Right;10 reps;Seated;Limitations Shoulder Abduction Limitations: to 60degrees; educated patient husband to perform Shoulder External Rotation: Right;10 reps;Seated;Limitations;PROM Shoulder External Rotation Limitations: to 30degress; educated patient's husband to perform Elbow Flexion: AROM;Right;10 reps;Standing Elbow Extension: AROM;Right;10 reps;Standing Wrist Flexion: AROM;Right;10 reps;Standing Wrist Extension: AROM;Right;10 reps;Standing Digit Composite Flexion: AROM;Right;10 reps;Standing Composite Extension: AROM;Right;10 reps;Standing Neck Lateral Flexion - Left: AROM;10 reps;Seated  End of Session OT - End of Session Equipment Utilized During Treatment: Gait belt Activity Tolerance: Patient tolerated treatment well Patient left: in chair;with call bell in reach;with family/visitor present General Behavior During Session: [facility] for tasks performed Cognition: [facility] for tasks performed  Tyreik Delahoussaye    12/27/2010, 1:21 PM

## 2010-12-27 NOTE — Progress Notes (Signed)
   CARE MANAGEMENT NOTE 12/27/2010  Patient:  Hannah Fischer, Hannah Fischer   Account Number:  [id number]  Date Initiated:  12/27/2010  Documentation initiated by:  SIMMONS,Davyn Elsasser  Subjective/Objective Assessment:   received referral for HHRN/PT/OT, coumadin education.     Action/Plan:   referral made to [facility] by RNCarollee Herter.   Anticipated DC Date:  12/27/2010   Anticipated DC Plan:  HOME W HOME HEALTH SERVICES  In-house referral  NA      DC Planning Services  CM consult      [facility] Choice  HOME HEALTH   Choice offered to / List presented to:  C-1 Patient   DME arranged  NA      DME agency  NA     HH arranged  HH-1 RN  HH-2 PT  HH-3 OT      [facility] agency  [facility]   Status of service:  Completed, signed off Medicare Important Message given?   (If response is "NO", the following Medicare IM given date fields will be blank) Date Medicare IM given:   Date Additional Medicare IM given:    Discharge Disposition:  HOME W HOME HEALTH SERVICES  Per UR Regulation:    Comments:

## 2010-12-27 NOTE — Discharge Summary (Signed)
Physician Discharge Summary  Patient ID: Hannah Fischer MRN: [id number] DOB/AGE: May 12, 1932 77 y.o.  Admit date: 12/25/2010 Discharge date: 12/27/2010  Admission Diagnoses: Right proximal humerus fracture.  Discharge Diagnoses: Same Active Problems:  * No active hospital problems. *    Discharged Condition: stable  Hospital Course: Patient's hospital course was essentially unremarkable she was discharged to home in stable condition  Consults: none  Significant Diagnostic Studies: labs: Stable  Treatments: surgery: Patient underwent right shoulder hemiarthroplasty.  Discharge Exam: Blood pressure 111/69, pulse 77, temperature 98 F (36.7 C), temperature source Oral, resp. rate 16, SpO2 99.00%. Patient's right upper extremity was neurovascularly intact at time of discharge.  Disposition:   Discharge Orders    Future Orders Please Complete By Expires   Diet - low sodium heart healthy      Call MD / Call 911      Comments:   If you experience chest pain or shortness of breath, CALL 911 and be transported to the hospital emergency room.  If you develope a fever above 101 F, pus (white drainage) or increased drainage or redness at the wound, or calf pain, call your surgeon's office.   Constipation Prevention      Comments:   Drink plenty of fluids.  Prune juice may be helpful.  You may use a stool softener, such as Colace (over the counter) 100 mg twice a day.  Use MiraLax (over the counter) for constipation as needed.   Discharge instructions      Comments:   Use sling for right arm at all times.     Current Discharge Medication List    START taking these medications   Details  oxyCODONE-acetaminophen (ROXICET) 5-325 MG per tablet Take 1 tablet by mouth every 4 (four) hours as needed for pain. Qty: 60 tablet, Refills: 0      CONTINUE these medications which have NOT CHANGED   Details  ALPRAZolam (XANAX) 0.25 MG tablet Take 0.25 mg by mouth at bedtime as needed.      aspirin 81 MG tablet Take 81 mg by mouth daily.      Calcium-Vitamin D-Vitamin K (VIACTIV) 500-100-40 MG-UNT-MCG CHEW Chew 1 tablet by mouth daily.      Cyanocobalamin (VITAMIN B-12 PO) Take 1 tablet by mouth daily.      ferrous sulfate 325 (65 FE) MG tablet Take 325 mg by mouth daily with breakfast.      gabapentin (NEURONTIN) 100 MG capsule Take 100 mg by mouth 3 (three) times daily.      glipiZIDE (GLUCOTROL XL) 10 MG 24 hr tablet Take 10 mg by mouth daily.      HYDROcodone-acetaminophen (VICODIN) 5-500 MG per tablet Take 1 tablet by mouth every 6 (six) hours as needed.      levothyroxine (SYNTHROID, LEVOTHROID) 75 MCG tablet Take 75 mcg by mouth daily.      lisinopril-hydrochlorothiazide (PRINZIDE,ZESTORETIC) 20-25 MG per tablet Take 1 tablet by mouth daily.      metFORMIN (GLUCOPHAGE) 1000 MG tablet Take 1,000 mg by mouth 2 (two) times daily with a meal.      niacin (SLO-NIACIN) 500 MG tablet Take 500 mg by mouth 2 (two) times daily with a meal.      OMEGA 3 1000 MG CAPS Take 1 capsule by mouth 4 (four) times daily.      omeprazole (PRILOSEC) 20 MG capsule Take 20 mg by mouth daily.      pioglitazone (ACTOS) 45 MG tablet Take 45 mg by mouth  daily.      potassium chloride SA (K-DUR,KLOR-CON) 20 MEQ tablet Take 20 mEq by mouth daily.         Follow-up Information    Follow up with [facility].   Contact information:   [facility] [address] [place] [address] [phone]          Signed: Nadara Mustard 12/27/2010, 7:01 AM

## 2010-12-27 NOTE — Progress Notes (Signed)
D/C instructions reviewed with patient and husband. Husband administered Lovenox correctly and verbalized understanding and comfort with procedure. RX x 3 given. HH services arranged with [facility] for PT/OT/RN for COumadin management. During course of hospital stay, pt's O2 sats fluctuated between mid 80s to 98%. With pts history of Bilateral PEs, CT angio chest was obtained- results negative for PE. Prophylactic Lovenox initiated for 5 day Bridge to Coumadin as INR was only 1.04 this am. Pt otherwise asymptomatic. Pt and husband ok with plan for precautions. IS/TCDB encouraged in hospital and beyond for at least a week at home. [facility] to begin checking INR tomorrow am. All questions answered. Pt d/c'ed home via wheelchair in stable condition

## 2010-12-27 NOTE — Progress Notes (Addendum)
ANTICOAGULATION CONSULT NOTE - Initial Consult  Pharmacy Consult for Lovenox & Coumadin Indication: VTE prophylaxis-bridge to therapeutic INR   Assessment: 77yoF being started on Lovenox to bridge to therapeutic INR. Started on coumadin on for VTE prophylaxis s/p shoulder surgery. Prior history of bilateral PE in 2008; chest CT today ruled out clot. MD decided on prophylactic dose of Lovenox. INR still sub-therapeutic 1.04; Will repeat 5mg  dose and consider increasing dose tomorrow.  Patient weight 84.6kg; CrCl ~30ml/min.  Goal of Therapy:  INR 2-3   Plan:  Lovenox 40mg  Q24 hours Coumadin 5mg  PO x1 today F/U daily INR   Sabra Heck 12/27/2010 12:42 PM      Allergies  Allergen Reactions  . Sulfa Antibiotics Other (See Comments)    Gastric intolerance  . Sulfites Other (See Comments)    Abdominal pain  . Codeine Anxiety    Patient Measurements: Height: 5\' 2"  (157.5 cm) Weight: 186 lb 8.2 oz (84.6 kg) IBW/kg (Calculated) : 50.1  BMI 34.2  Vital Signs: Temp: 98.4 F (36.9 C) (11/22 0635) BP: 114/61 mmHg (11/22 0635) Pulse Rate: 87  (11/22 0635)  Labs:  Basename 12/26/10 0107 12/26/10 0100  HGB -- 8.8*  HCT -- 26.8*  PLT -- 143*  APTT -- --  LABPROT 14.3 --  INR 1.09 --  HEPARINUNFRC -- --  CREATININE -- 0.73  CKTOTAL -- --  CKMB -- --  TROPONINI -- --   Estimated Creatinine Clearance: 61.3 ml/min (by C-G formula based on Cr of 0.73).  Medical History: Past Medical History  Diagnosis Date  . Hypertension     on medication  . Seizures     once, age 5  . Complication of anesthesia     unable to breathe lying  flat on back  . Diabetes mellitus     type 2  niddm x 25 yrs  . Blood transfusion 1979    for anemia  . Anemia     chronic  . Hypothyroidism     takes levoxyl  . Arthritis     djd  . 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 Anxiety   . GERD (gastroesophageal reflux disease)   . Hyperlipemia   . Chest pain    evaluated @ [facility] cardiology; had a stress test 2012  . Diverticular disease   . Irritable bowel syndrome   . Neuromuscular disorder   . Neuropathy in diabetes   . Pulmonary embolism 06-2010    bilateral     Isabell Bonafede N 12/27/2010,9:38 AM

## 2010-12-31 LAB — GLUCOSE, CAPILLARY: Glucose-Capillary: 95 mg/dL (ref 70–99)

## 2011-10-21 ENCOUNTER — Other Ambulatory Visit: Payer: Self-pay | Admitting: Family Medicine

## 2011-10-21 ENCOUNTER — Ambulatory Visit
Admission: RE | Admit: 2011-10-21 | Discharge: 2011-10-21 | Disposition: A | Discharge status: Home / Self Care | Payer: Medicare Other | Source: Ambulatory Visit | Attending: Family Medicine | Admitting: Family Medicine

## 2011-10-21 DIAGNOSIS — S0990XA Unspecified injury of head, initial encounter: Secondary | ICD-10-CM

## 2011-10-23 ENCOUNTER — Other Ambulatory Visit: Payer: Self-pay | Admitting: Family Medicine

## 2011-10-23 DIAGNOSIS — G459 Transient cerebral ischemic attack, unspecified: Secondary | ICD-10-CM

## 2011-10-25 ENCOUNTER — Ambulatory Visit
Admission: RE | Admit: 2011-10-25 | Discharge: 2011-10-25 | Disposition: A | Discharge status: Home / Self Care | Payer: Medicare Other | Source: Ambulatory Visit | Attending: Family Medicine | Admitting: Family Medicine

## 2011-10-25 DIAGNOSIS — G459 Transient cerebral ischemic attack, unspecified: Secondary | ICD-10-CM

## 2011-11-15 ENCOUNTER — Inpatient Hospital Stay (HOSPITAL_COMMUNITY)
Admission: EM | Admit: 2011-11-15 | Discharge: 2011-11-17 | DRG: 069 | Disposition: A | Discharge status: Home / Self Care | Payer: Medicare Other | Attending: Internal Medicine | Admitting: Internal Medicine

## 2011-11-15 ENCOUNTER — Emergency Department (HOSPITAL_COMMUNITY): Payer: Medicare Other

## 2011-11-15 ENCOUNTER — Observation Stay (HOSPITAL_COMMUNITY): Payer: Medicare Other

## 2011-11-15 ENCOUNTER — Encounter (HOSPITAL_COMMUNITY): Payer: Self-pay | Admitting: Emergency Medicine

## 2011-11-15 DIAGNOSIS — Z9089 Acquired absence of other organs: Secondary | ICD-10-CM

## 2011-11-15 DIAGNOSIS — G459 Transient cerebral ischemic attack, unspecified: Principal | ICD-10-CM | POA: Diagnosis present

## 2011-11-15 DIAGNOSIS — D649 Anemia, unspecified: Secondary | ICD-10-CM | POA: Diagnosis present

## 2011-11-15 DIAGNOSIS — R8281 Pyuria: Secondary | ICD-10-CM

## 2011-11-15 DIAGNOSIS — Z8719 Personal history of other diseases of the digestive system: Secondary | ICD-10-CM

## 2011-11-15 DIAGNOSIS — F411 Generalized anxiety disorder: Secondary | ICD-10-CM | POA: Diagnosis present

## 2011-11-15 DIAGNOSIS — Z7982 Long term (current) use of aspirin: Secondary | ICD-10-CM

## 2011-11-15 DIAGNOSIS — E1142 Type 2 diabetes mellitus with diabetic polyneuropathy: Secondary | ICD-10-CM | POA: Diagnosis present

## 2011-11-15 DIAGNOSIS — Z6832 Body mass index (BMI) 32.0-32.9, adult: Secondary | ICD-10-CM

## 2011-11-15 DIAGNOSIS — E1149 Type 2 diabetes mellitus with other diabetic neurological complication: Secondary | ICD-10-CM | POA: Diagnosis present

## 2011-11-15 DIAGNOSIS — I6529 Occlusion and stenosis of unspecified carotid artery: Secondary | ICD-10-CM | POA: Diagnosis present

## 2011-11-15 DIAGNOSIS — Z86711 Personal history of pulmonary embolism: Secondary | ICD-10-CM

## 2011-11-15 DIAGNOSIS — I10 Essential (primary) hypertension: Secondary | ICD-10-CM | POA: Diagnosis present

## 2011-11-15 DIAGNOSIS — M1990 Unspecified osteoarthritis, unspecified site: Secondary | ICD-10-CM | POA: Diagnosis present

## 2011-11-15 DIAGNOSIS — E669 Obesity, unspecified: Secondary | ICD-10-CM | POA: Diagnosis present

## 2011-11-15 DIAGNOSIS — E119 Type 2 diabetes mellitus without complications: Secondary | ICD-10-CM | POA: Diagnosis present

## 2011-11-15 DIAGNOSIS — Z96619 Presence of unspecified artificial shoulder joint: Secondary | ICD-10-CM

## 2011-11-15 DIAGNOSIS — Z96659 Presence of unspecified artificial knee joint: Secondary | ICD-10-CM

## 2011-11-15 DIAGNOSIS — Z882 Allergy status to sulfonamides status: Secondary | ICD-10-CM

## 2011-11-15 DIAGNOSIS — E039 Hypothyroidism, unspecified: Secondary | ICD-10-CM | POA: Diagnosis present

## 2011-11-15 DIAGNOSIS — E785 Hyperlipidemia, unspecified: Secondary | ICD-10-CM | POA: Diagnosis present

## 2011-11-15 DIAGNOSIS — Z79899 Other long term (current) drug therapy: Secondary | ICD-10-CM

## 2011-11-15 DIAGNOSIS — N390 Urinary tract infection, site not specified: Secondary | ICD-10-CM | POA: Diagnosis present

## 2011-11-15 DIAGNOSIS — Z885 Allergy status to narcotic agent status: Secondary | ICD-10-CM

## 2011-11-15 DIAGNOSIS — K219 Gastro-esophageal reflux disease without esophagitis: Secondary | ICD-10-CM | POA: Diagnosis present

## 2011-11-15 HISTORY — DX: Transient cerebral ischemic attack, unspecified: G45.9

## 2011-11-15 HISTORY — DX: Pyuria: R82.81

## 2011-11-15 HISTORY — DX: Type 2 diabetes mellitus without complications: E11.9

## 2011-11-15 LAB — COMPREHENSIVE METABOLIC PANEL
ALT: 18 U/L (ref 0–35)
AST: 23 U/L (ref 0–37)
Albumin: 3.9 g/dL (ref 3.5–5.2)
Alkaline Phosphatase: 86 U/L (ref 39–117)
BUN: 20 mg/dL (ref 6–23)
CO2: 26 mEq/L (ref 19–32)
Calcium: 9.8 mg/dL (ref 8.4–10.5)
Chloride: 101 mEq/L (ref 96–112)
Creatinine, Ser: 0.78 mg/dL (ref 0.50–1.10)
GFR calc Af Amer: >90 mL/min (ref >90)
GFR calc non Af Amer: 79 mL/min — ABNORMAL LOW (ref >90)
Glucose, Bld: 74 mg/dL (ref 70–99)
Potassium: 4 mEq/L (ref 3.5–5.1)
Sodium: 138 mEq/L (ref 135–145)
Total Bilirubin: 0.3 mg/dL (ref 0.3–1.2)
Total Protein: 6.9 g/dL (ref 6.0–8.3)

## 2011-11-15 LAB — URINALYSIS, ROUTINE W REFLEX MICROSCOPIC
Bilirubin Urine: NEGATIVE
Glucose, UA: NEGATIVE mg/dL
Hgb urine dipstick: NEGATIVE
Ketones, ur: NEGATIVE mg/dL
Nitrite: NEGATIVE
Protein, ur: NEGATIVE mg/dL
Specific Gravity, Urine: 1.021 (ref 1.005–1.030)
Urobilinogen, UA: 0.2 mg/dL (ref 0.0–1.0)
pH: 5 (ref 5.0–8.0)

## 2011-11-15 LAB — CBC WITH DIFFERENTIAL/PLATELET
Basophils Absolute: 0 10*3/uL (ref 0.0–0.1)
Basophils Relative: 1 % (ref 0–1)
Eosinophils Absolute: 0.1 10*3/uL (ref 0.0–0.7)
Eosinophils Relative: 3 % (ref 0–5)
HCT: 39.3 % (ref 36.0–46.0)
Hemoglobin: 13 g/dL (ref 12.0–15.0)
Lymphocytes Relative: 38 % (ref 12–46)
Lymphs Abs: 1.6 10*3/uL (ref 0.7–4.0)
MCH: 31.6 pg (ref 26.0–34.0)
MCHC: 33.1 g/dL (ref 30.0–36.0)
MCV: 95.6 fL (ref 78.0–100.0)
Monocytes Absolute: 0.5 10*3/uL (ref 0.1–1.0)
Monocytes Relative: 11 % (ref 3–12)
Neutro Abs: 2 10*3/uL (ref 1.7–7.7)
Neutrophils Relative %: 47 % (ref 43–77)
Platelets: 203 10*3/uL (ref 150–400)
RBC: 4.11 MIL/uL (ref 3.87–5.11)
RDW: 13.1 % (ref 11.5–15.5)
WBC: 4.2 10*3/uL (ref 4.0–10.5)

## 2011-11-15 LAB — LIPID PANEL
Cholesterol: 207 mg/dL — ABNORMAL HIGH (ref 0–200)
HDL: 66 mg/dL (ref >39)
LDL Cholesterol: 110 mg/dL — ABNORMAL HIGH (ref 0–99)
Total CHOL/HDL Ratio: 3.1 RATIO
Triglycerides: 157 mg/dL — ABNORMAL HIGH (ref <150)
VLDL: 31 mg/dL (ref 0–40)

## 2011-11-15 LAB — URINE MICROSCOPIC-ADD ON

## 2011-11-15 LAB — PROTIME-INR
INR: 0.86 (ref 0.00–1.49)
Prothrombin Time: 11.7 seconds (ref 11.6–15.2)

## 2011-11-15 LAB — GLUCOSE, CAPILLARY: Glucose-Capillary: 78 mg/dL (ref 70–99)

## 2011-11-15 MED ORDER — SODIUM CHLORIDE 0.9 % IJ SOLN
3.0000 mL | Freq: Two times a day (BID) | INTRAMUSCULAR | Status: DC
  Administered 2011-11-16: 3 mL via INTRAVENOUS

## 2011-11-15 MED ORDER — LISINOPRIL-HYDROCHLOROTHIAZIDE 20-25 MG PO TABS: 1.0000 | ORAL_TABLET | Freq: Every day | ORAL | Status: DC

## 2011-11-15 MED ORDER — VITAMIN B-12 100 MCG PO TABS
100.0000 ug | ORAL_TABLET | Freq: Every day | ORAL | Status: DC
  Administered 2011-11-16 – 2011-11-17 (×2): 100 ug via ORAL
  Filled 2011-11-15 (×2): qty 1

## 2011-11-15 MED ORDER — INSULIN ASPART 100 UNIT/ML ~~LOC~~ SOLN
0.0000 [IU] | SUBCUTANEOUS | Status: DC
  Administered 2011-11-15: 0 [IU] via SUBCUTANEOUS
  Administered 2011-11-16: 2 [IU] via SUBCUTANEOUS
  Administered 2011-11-16 (×2): 0 [IU] via SUBCUTANEOUS

## 2011-11-15 MED ORDER — DEXTROSE 5 % IV SOLN
1.0000 g | INTRAVENOUS | Status: DC
  Administered 2011-11-15 – 2011-11-16 (×2): 1 g via INTRAVENOUS
  Filled 2011-11-15 (×2): qty 10

## 2011-11-15 MED ORDER — PIOGLITAZONE HCL 45 MG PO TABS
45.0000 mg | ORAL_TABLET | Freq: Every day | ORAL | Status: DC
  Administered 2011-11-16 – 2011-11-17 (×2): 45 mg via ORAL
  Filled 2011-11-15 (×2): qty 1

## 2011-11-15 MED ORDER — DEXTROSE 5 % IV SOLN
1.0000 g | Freq: Once | INTRAVENOUS | Status: AC
  Administered 2011-11-15: 1 g via INTRAVENOUS
  Filled 2011-11-15: qty 10

## 2011-11-15 MED ORDER — GABAPENTIN 100 MG PO CAPS
100.0000 mg | ORAL_CAPSULE | Freq: Three times a day (TID) | ORAL | Status: DC
  Administered 2011-11-16 – 2011-11-17 (×5): 100 mg via ORAL
  Filled 2011-11-15 (×7): qty 1

## 2011-11-15 MED ORDER — POTASSIUM CHLORIDE CRYS ER 20 MEQ PO TBCR
20.0000 meq | EXTENDED_RELEASE_TABLET | Freq: Every day | ORAL | Status: DC
  Administered 2011-11-16 – 2011-11-17 (×2): 20 meq via ORAL
  Filled 2011-11-15 (×2): qty 1

## 2011-11-15 MED ORDER — LISINOPRIL 20 MG PO TABS
20.0000 mg | ORAL_TABLET | Freq: Every day | ORAL | Status: DC
  Filled 2011-11-15: qty 1

## 2011-11-15 MED ORDER — ALPRAZOLAM 0.25 MG PO TABS: 0.2500 mg | ORAL_TABLET | Freq: Every evening | ORAL | Status: DC | PRN

## 2011-11-15 MED ORDER — ENOXAPARIN SODIUM 40 MG/0.4ML ~~LOC~~ SOLN
40.0000 mg | SUBCUTANEOUS | Status: DC
  Administered 2011-11-16 (×2): 40 mg via SUBCUTANEOUS
  Filled 2011-11-15 (×3): qty 0.4

## 2011-11-15 MED ORDER — HYDROCHLOROTHIAZIDE 25 MG PO TABS
25.0000 mg | ORAL_TABLET | Freq: Every day | ORAL | Status: DC
  Filled 2011-11-15: qty 1

## 2011-11-15 MED ORDER — ASPIRIN 325 MG PO TABS
325.0000 mg | ORAL_TABLET | Freq: Every day | ORAL | Status: DC
  Administered 2011-11-16: 325 mg via ORAL
  Filled 2011-11-15 (×2): qty 1

## 2011-11-15 MED ORDER — NIACIN ER 500 MG PO TBCR
500.0000 mg | EXTENDED_RELEASE_TABLET | Freq: Two times a day (BID) | ORAL | Status: DC
  Administered 2011-11-16: 500 mg via ORAL
  Filled 2011-11-15 (×4): qty 1

## 2011-11-15 MED ORDER — ACETAMINOPHEN 325 MG PO TABS: 650.0000 mg | ORAL_TABLET | ORAL | Status: DC | PRN

## 2011-11-15 MED ORDER — PANTOPRAZOLE SODIUM 40 MG PO TBEC
40.0000 mg | DELAYED_RELEASE_TABLET | Freq: Every day | ORAL | Status: DC
  Administered 2011-11-16 – 2011-11-17 (×2): 40 mg via ORAL
  Filled 2011-11-15 (×4): qty 1

## 2011-11-15 MED ORDER — GLIPIZIDE ER 10 MG PO TB24
10.0000 mg | ORAL_TABLET | Freq: Every day | ORAL | Status: DC
  Administered 2011-11-16 – 2011-11-17 (×2): 10 mg via ORAL
  Filled 2011-11-15 (×4): qty 1

## 2011-11-15 MED ORDER — LEVOTHYROXINE SODIUM 75 MCG PO TABS
75.0000 ug | ORAL_TABLET | Freq: Every day | ORAL | Status: DC
  Administered 2011-11-16: 75 ug via ORAL
  Filled 2011-11-15 (×3): qty 1

## 2011-11-15 MED ORDER — SODIUM CHLORIDE 0.9 % IV SOLN
INTRAVENOUS | Status: DC
  Administered 2011-11-16 (×2): via INTRAVENOUS

## 2011-11-15 MED ORDER — SODIUM CHLORIDE 0.9 % IJ SOLN: 3.0000 mL | INTRAMUSCULAR | Status: DC | PRN

## 2011-11-15 MED ORDER — SODIUM CHLORIDE 0.9 % IV SOLN: 250.0000 mL | INTRAVENOUS | Status: DC | PRN

## 2011-11-15 NOTE — H&P (Signed)
PCP:   Neldon Labella, MD   Chief Complaint:  Transient right-sided weakness.   HPI: This is a 78 year old female, with history of HTN, diabetes mellitus with neuropathy, GERD, hypothyroidism, dyslipidemia, anxiety, IBS, diverticular disease, chronic anemia, right kidney agenesis, bilateral PE 2010, treated with Coumadin anticoagulation for one year (discontinued in 2011), previous gallbladder surgery, DJD, s/p bilateral knee replacement, s/p back surgery, s/p right shoulder hemiarthroplasty 12/2010, s/p right eye cataract surgery, presenting with above symptoms. According to patient, she had gone out shopping with her spouse and had felt quite fine all day. The ended up at the dollar store at about 3:30 PM, and while she was standing in line to pay at the cashier's, she found she was suddenly unable to move her right leg, when it came to her turn. Her right arm felt very heavy, and she dropped the objects she was holding. She had no chest pain or palpitations, no visual obscuration, and her speech was normal. She informed her husband that she needed to go to the hospital, he helped her outside, and drove her to the ED. On arrival, she was already feeling better, and within the hour, all symptoms had resolved. It appears that patient had a similar episode, involving only the right arm about 2 weeks ago, and 2 weeks prior to that , had transient LUE weakness. She has been experiencing dizziness for the past one month, and on 10/24/11, after walking from the bedroom to the bathroom, she had an episode of dizziness, without chest pain or palpitations, fell backwards into the bathtub, and banged her head. She saw her PMD on 10/25/11, and had a carotid/vertebral artery duplex done.    Allergies:   Allergies  Allergen Reactions  . Sulfa Antibiotics Other (See Comments)    Gastric intolerance  . Sulfites Other (See Comments)    Abdominal pain  . Codeine Anxiety      Past Medical History  Diagnosis  Date  . Hypertension     on medication  . Seizures     once, age 17  . Complication of anesthesia     unable to breathe lying  flat on back  . Diabetes mellitus     type 2  niddm x 25 yrs  . Blood transfusion 1979    for anemia  . Anemia     chronic  . Hypothyroidism     takes levoxyl  . Arthritis     djd  . 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 Anxiety   . GERD (gastroesophageal reflux disease)   . Hyperlipemia   . Chest pain     evaluated @ [facility] cardiology; had a stress test 2012  . Diverticular disease   . Irritable bowel syndrome   . Neuromuscular disorder   . Neuropathy in diabetes   . Pulmonary embolism 06-2010    bilateral    Past Surgical History  Procedure Date  . Eye surgery 2007    cataract ext/ iol rt eye  . Blepharoplasty 2012    rt eye  . Back surgery   . Lumbar disc surgery 1990  . Joint replacement 2001,2005    both knees  . Cholecystectomy   . Shoulder hemi-arthroplasty 12/25/2010    Procedure: SHOULDER HEMI-ARTHROPLASTY;  Surgeon: Cammy Copa;  Location: [facility] OR;  Service: Orthopedics;  Laterality: Right;    Prior to Admission medications   Medication Sig Start Date End Date Taking? Authorizing Provider  ALPRAZolam (XANAX) 0.25 MG tablet Take 0.25 mg by mouth at bedtime as needed. For sleep/anxiety   Yes Historical Provider, MD  aspirin EC 81 MG tablet Take 81 mg by mouth at bedtime.   Yes Historical Provider, MD  Cyanocobalamin (VITAMIN B-12 PO) Take 1 tablet by mouth daily.     Yes Historical Provider, MD  gabapentin (NEURONTIN) 100 MG capsule Take 100 mg by mouth 3 (three) times daily.     Yes Historical Provider, MD  glipiZIDE (GLUCOTROL XL) 10 MG 24 hr tablet Take 10 mg by mouth daily.     Yes Historical Provider, MD  HYDROcodone-acetaminophen (VICODIN) 5-500 MG per tablet Take 1 tablet by mouth every 6 (six) hours as needed. For pain   Yes Historical Provider, MD  KRILL OIL PO Take 1 capsule by mouth daily.   Yes  Historical Provider, MD  levothyroxine (SYNTHROID, LEVOTHROID) 75 MCG tablet Take 75 mcg by mouth daily.     Yes Historical Provider, MD  lisinopril-hydrochlorothiazide (PRINZIDE,ZESTORETIC) 20-25 MG per tablet Take 1 tablet by mouth daily.     Yes Historical Provider, MD  metFORMIN (GLUCOPHAGE) 1000 MG tablet Take 1,000 mg by mouth 2 (two) times daily with a meal.     Yes Historical Provider, MD  niacin (SLO-NIACIN) 500 MG tablet Take 500 mg by mouth 2 (two) times daily with a meal.     Yes Historical Provider, MD  omeprazole (PRILOSEC) 20 MG capsule Take 20 mg by mouth daily.     Yes Historical Provider, MD  pioglitazone (ACTOS) 45 MG tablet Take 45 mg by mouth daily.     Yes Historical Provider, MD  potassium chloride SA (K-DUR,KLOR-CON) 20 MEQ tablet Take 20 mEq by mouth daily.     Yes Historical Provider, MD    Social History: Patient is married, has 2 offspring, does not have a smoking history on file. She has never used smokeless tobacco. She reports that she does not drink alcohol or use illicit drugs.  Family History:  Father died at age 24 years. He had diabetes, heart disease, hypertension, as did her mother, who died at 56 years.    Review of Systems:  As per HPI and chief complaint. Patent denies fatigue, diminished appetite, weight loss, fever, chills, headache, blurred vision, difficulty in speaking, dysphagia, chest pain, cough, shortness of breath, orthopnea, paroxysmal nocturnal dyspnea, nausea, diaphoresis, abdominal pain, vomiting, diarrhea, belching, heartburn, hematemesis, melena, dysuria, nocturia, urinary frequency, hematochezia, lower extremity swelling, pain, or redness. The rest of the systems review is negative.  Physical Exam:  General:  Patient does not appear to be in obvious acute distress. Alert, communicative, fully oriented, talking in complete sentences, not short of breath at rest.  HEENT:  No clinical pallor, no jaundice, no conjunctival injection or  discharge. Hydration status is fair. NECK:  Supple, JVP not seen, no carotid bruits, no palpable lymphadenopathy, no palpable goiter. CHEST:  Clinically clear to auscultation, no wheezes, no crackles. HEART:  Sounds 1 and 2 heard, normal, regular, no murmurs. ABDOMEN:  Mildly obese, soft, non-tender, no palpable organomegaly, no palpable masses, normal bowel sounds. GENITALIA:  Not examined. LOWER EXTREMITIES:  No pitting edema, palpable peripheral pulses. MUSCULOSKELETAL SYSTEM:  Generalized osteoarthritic changes, otherwise, normal. CENTRAL NERVOUS SYSTEM:  No focal neurologic deficit on gross examination.  Labs on Admission:  Results for orders placed during the hospital encounter of 11/15/11 (from the past 48 hour(s))  CBC WITH DIFFERENTIAL     Status: Normal   Collection Time  11/15/11  4:22 PM      Component Value Range Comment   WBC 4.2  4.0 - 10.5 K/uL    RBC 4.11  3.87 - 5.11 MIL/uL    Hemoglobin 13.0  12.0 - 15.0 g/dL    HCT 78.2  95.6 - 21.3 %    MCV 95.6  78.0 - 100.0 fL    MCH 31.6  26.0 - 34.0 pg    MCHC 33.1  30.0 - 36.0 g/dL    RDW 08.6  57.8 - 46.9 %    Platelets 203  150 - 400 K/uL    Neutrophils Relative 47  43 - 77 %    Neutro Abs 2.0  1.7 - 7.7 K/uL    Lymphocytes Relative 38  12 - 46 %    Lymphs Abs 1.6  0.7 - 4.0 K/uL    Monocytes Relative 11  3 - 12 %    Monocytes Absolute 0.5  0.1 - 1.0 K/uL    Eosinophils Relative 3  0 - 5 %    Eosinophils Absolute 0.1  0.0 - 0.7 K/uL    Basophils Relative 1  0 - 1 %    Basophils Absolute 0.0  0.0 - 0.1 K/uL   COMPREHENSIVE METABOLIC PANEL     Status: Abnormal   Collection Time   11/15/11  4:22 PM      Component Value Range Comment   Sodium 138  135 - 145 mEq/L    Potassium 4.0  3.5 - 5.1 mEq/L    Chloride 101  96 - 112 mEq/L    CO2 26  19 - 32 mEq/L    Glucose, Bld 74  70 - 99 mg/dL    BUN 20  6 - 23 mg/dL    Creatinine, Ser 6.29  0.50 - 1.10 mg/dL    Calcium 9.8  8.4 - 52.8 mg/dL    Total Protein 6.9  6.0 -  8.3 g/dL    Albumin 3.9  3.5 - 5.2 g/dL    AST 23  0 - 37 U/L    ALT 18  0 - 35 U/L    Alkaline Phosphatase 86  39 - 117 U/L    Total Bilirubin 0.3  0.3 - 1.2 mg/dL    GFR calc non Af Amer 79 (*) >90 mL/min    GFR calc Af Amer >90  >90 mL/min   GLUCOSE, CAPILLARY     Status: Normal   Collection Time   11/15/11  4:29 PM      Component Value Range Comment   Glucose-Capillary 78  70 - 99 mg/dL    Comment 1 Notify RN     URINALYSIS, ROUTINE W REFLEX MICROSCOPIC     Status: Abnormal   Collection Time   11/15/11  5:39 PM      Component Value Range Comment   Color, Urine YELLOW  YELLOW    APPearance CLOUDY (*) CLEAR    Specific Gravity, Urine 1.021  1.005 - 1.030    pH 5.0  5.0 - 8.0    Glucose, UA NEGATIVE  NEGATIVE mg/dL    Hgb urine dipstick NEGATIVE  NEGATIVE    Bilirubin Urine NEGATIVE  NEGATIVE    Ketones, ur NEGATIVE  NEGATIVE mg/dL    Protein, ur NEGATIVE  NEGATIVE mg/dL    Urobilinogen, UA 0.2  0.0 - 1.0 mg/dL    Nitrite NEGATIVE  NEGATIVE    Leukocytes, UA LARGE (*) NEGATIVE   URINE MICROSCOPIC-ADD ON  Status: Normal   Collection Time   11/15/11  5:39 PM      Component Value Range Comment   Squamous Epithelial / LPF RARE  RARE    WBC, UA 11-20  <3 WBC/hpf    Bacteria, UA RARE  RARE    Urine-Other MUCOUS PRESENT     PROTIME-INR     Status: Normal   Collection Time   11/15/11  7:45 PM      Component Value Range Comment   Prothrombin Time 11.7  11.6 - 15.2 seconds    INR 0.86  0.00 - 1.49     Radiological Exams on Admission: *RADIOLOGY REPORT*  Clinical Data: Transit right leg and arm weakness.  CT HEAD WITHOUT CONTRAST  Technique: Contiguous axial images were obtained from the base of  the skull through the vertex without contrast.  Comparison: CT head without contrast 10/21/2011.  Findings: No acute cortical infarct, hemorrhage, or mass lesion is  present. Mild generalized atrophy and white matter hypoattenuation  is similar to the prior exam. The  ventricles are proportionate to  the degree of atrophy. No significant extra-axial fluid collection  is present.  The paranasal sinuses and mastoid air cells are clear. The osseous  skull is intact. Atherosclerotic calcifications are noted within  the cavernous carotid arteries bilaterally.  IMPRESSION:  1. Stable atrophy and white matter disease. This likely reflects  the sequelae of chronic microvascular ischemia.  2. No acute intracranial abnormality or significant interval  change.  Original Report Authenticated By: Jamesetta Orleans. MATTERN, M.D.   Assessment/Plan Active Problems:  1. TIA (transient ischemic attack): Patient presented with transient right-sided UE/LE weakness, which had already resolved, at the time of initial evaluation by ED MD. On detailed history, it appears that she has had similar previous episodes in the past 4 weeks. Clinically, this history is consistent with recurrent TIAs. She certainly does have cerebrovascular risk factors. 12-Lead EKG shows SR, LAD and anterior Q-waves and vascular doppler of 10/25/11, revealed bilateral carotid bifurcation and proximal ICA plaque, resulting in less than 50% diameter stenosis. Both vertebrals, showed antegrade flow. She will be admitted for observation, telemetric monitoring, neuro-checks and TIA work up. She is currently on 81 mg ASA, which we shall increase to 325 mg daily.  2. HTN (hypertension): BP is uncontrolled at 184/84-148/64. We shall continue pre-admission antihypertensives, and monitor. Some adjustment may prove necessary. 3.  Diabetes mellitus: This is type 2, and appears controlled based on random blood glucose. Continued on pre-admission regimen. Metformin will be on hold for now. Will manage with diet /SSI in addition.  4. Hypothyroidism: Continue thyroxine replacement therapy.  5. Dyslipidemia: Continue pre-admission lipid-lowering medication and check lipid profile/TSH.  5. GERD (gastroesophageal reflux  disease): Asymptomatic on PPI.  6. Pyuria: patient has a pyuria, but no significant bacteriuria. ED MD, has started iv Rocephin, which we shall continue, and urine culture is pending.   Further management will depend on clinical course.   Comment: Patient is FULL CODE.    Time Spent on Admission: 40 mins.   Riker Collier,CHRISTOPHER 11/15/2011, 8:33 PM

## 2011-11-15 NOTE — ED Notes (Signed)
[id number]<AV> Expected date:<BR> Expected time:<BR> Means of arrival:<BR> Comments:<BR> Hold for room 24

## 2011-11-15 NOTE — ED Notes (Signed)
RN request to obtain labs with the start of IV 

## 2011-11-15 NOTE — ED Notes (Signed)
Pt able to ambulate to bathrm w/o assistance

## 2011-11-15 NOTE — ED Notes (Addendum)
Pt presenting to ed with c/o while at the dollar store she developed sudden onset of right leg weakness and right arm weakness. Pt denies chest pain, shortness of breath nausea and vomiting. Pt's husband states pt sounds normal to him no slurred speech noted. Pt with bilateral equal grips. Pt states she couldn't walk. Pt's husband states she was dropping things in the store and couldn't pick them up. Pt states the right side of her face feels funny. Pt with small amount of droop noted on her left side of face

## 2011-11-15 NOTE — ED Provider Notes (Signed)
History     CSN: [id number]  Arrival date & time 11/15/11  1606   First MD Initiated Contact with Patient 11/15/11 1621      Chief Complaint  Patient presents with  . Weakness    (Consider location/radiation/quality/duration/timing/severity/associated sxs/prior treatment) HPI... level V caveat for urgent need for intervention. Right leg and right arm weakness approximately 320 today.  No speech impediment. She was dropping things with her right hand. Symptoms have dramatically improved. Severity was severe. Nothing makes symptoms better or worse  Past Medical History  Diagnosis Date  . Hypertension     on medication  . Seizures     once, age 78  . Complication of anesthesia     unable to breathe lying  flat on back  . Diabetes mellitus     type 2  niddm x 25 yrs  . Blood transfusion 1979    for anemia  . Anemia     chronic  . Hypothyroidism     takes levoxyl  . Arthritis     djd  . 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 Anxiety   . GERD (gastroesophageal reflux disease)   . Hyperlipemia   . Chest pain     evaluated @ [facility] cardiology; had a stress test 2012  . Diverticular disease   . Irritable bowel syndrome   . Neuromuscular disorder   . Neuropathy in diabetes   . Pulmonary embolism 06-2010    bilateral    Past Surgical History  Procedure Date  . Eye surgery 2007    cataract ext/ iol rt eye  . Blepharoplasty 2012    rt eye  . Back surgery   . Lumbar disc surgery 1990  . Joint replacement 2001,2005    both knees  . Cholecystectomy   . Shoulder hemi-arthroplasty 12/25/2010    Procedure: SHOULDER HEMI-ARTHROPLASTY;  Surgeon: Cammy Copa;  Location: [facility] OR;  Service: Orthopedics;  Laterality: Right;    No family history on file.  History  Substance Use Topics  . Smoking status: Not on file  . Smokeless tobacco: Never Used  . Alcohol Use: No    OB History    Grav Para Term Preterm Abortions TAB SAB Ect Mult Living        Review of Systems  Unable to perform ROS: Other    Allergies  Sulfa antibiotics; Sulfites; and Codeine  Home Medications   Current Outpatient Rx  Name Route Sig Dispense Refill  . ALPRAZOLAM 0.25 MG PO TABS Oral Take 0.25 mg by mouth at bedtime as needed.      . ASPIRIN 81 MG PO TABS Oral Take 81 mg by mouth daily.      Marland Kitchen CALCIUM-VITAMIN D-VITAMIN K 500-100-40 MG-UNT-MCG PO CHEW Oral Chew 1 tablet by mouth daily.      Marland Kitchen VITAMIN B-12 PO Oral Take 1 tablet by mouth daily.      Marland Kitchen FERROUS SULFATE 325 (65 FE) MG PO TABS Oral Take 325 mg by mouth daily with breakfast.      . GABAPENTIN 100 MG PO CAPS Oral Take 100 mg by mouth 3 (three) times daily.      Marland Kitchen GLIPIZIDE ER 10 MG PO TB24 Oral Take 10 mg by mouth daily.      Marland Kitchen HYDROCODONE-ACETAMINOPHEN 5-500 MG PO TABS Oral Take 1 tablet by mouth every 6 (six) hours as needed.      Marland Kitchen LEVOTHYROXINE SODIUM 75 MCG PO TABS Oral  Take 75 mcg by mouth daily.      Marland Kitchen LISINOPRIL-HYDROCHLOROTHIAZIDE 20-25 MG PO TABS Oral Take 1 tablet by mouth daily.      Marland Kitchen METFORMIN HCL 1000 MG PO TABS Oral Take 1,000 mg by mouth 2 (two) times daily with a meal.      . NIACIN ER 500 MG PO TBCR Oral Take 500 mg by mouth 2 (two) times daily with a meal.      . OMEGA 3 1000 MG PO CAPS Oral Take 1 capsule by mouth 4 (four) times daily.      Marland Kitchen OMEPRAZOLE 20 MG PO CPDR Oral Take 20 mg by mouth daily.      Marland Kitchen PIOGLITAZONE HCL 45 MG PO TABS Oral Take 45 mg by mouth daily.      Marland Kitchen POTASSIUM CHLORIDE CRYS ER 20 MEQ PO TBCR Oral Take 20 mEq by mouth daily.      . WARFARIN SODIUM 5 MG PO TABS Oral Take 1 tablet (5 mg total) by mouth daily. 14 tablet 0    BP 184/84  Pulse 76  Temp 97.9 F (36.6 C) (Oral)  Resp 20  SpO2 95%  Physical Exam  Nursing note and vitals reviewed. Constitutional: She is oriented to person, place, and time. She appears well-developed and well-nourished.  HENT:  Head: Normocephalic and atraumatic.  Eyes: Conjunctivae normal and EOM are normal.  Pupils are equal, round, and reactive to light.  Neck: Normal range of motion. Neck supple.  Cardiovascular: Normal rate, regular rhythm and normal heart sounds.   Pulmonary/Chest: Effort normal and breath sounds normal.  Abdominal: Soft. Bowel sounds are normal.  Musculoskeletal: Normal range of motion.  Neurological: She is alert and oriented to person, place, and time.  Skin: Skin is warm and dry.  Psychiatric: She has a normal mood and affect.    ED Course  Procedures (including critical care time)  Labs Reviewed  COMPREHENSIVE METABOLIC PANEL - Abnormal; Notable for the following:    GFR calc non Af Amer 79 (*)     All other components within normal limits  URINALYSIS, ROUTINE W REFLEX MICROSCOPIC - Abnormal; Notable for the following:    APPearance CLOUDY (*)     Leukocytes, UA LARGE (*)     All other components within normal limits  CBC WITH DIFFERENTIAL  GLUCOSE, CAPILLARY  URINE MICROSCOPIC-ADD ON  URINE CULTURE  TSH  PROTIME-INR   No results found. Ct Head Wo Contrast  11/15/2011  *RADIOLOGY REPORT*  Clinical Data: Transit right leg and arm weakness.  CT HEAD WITHOUT CONTRAST  Technique:  Contiguous axial images were obtained from the base of the skull through the vertex without contrast.  Comparison: CT head without contrast 10/21/2011.  Findings: No acute cortical infarct, hemorrhage, or mass lesion is present.  Mild generalized atrophy and white matter hypoattenuation is similar to the prior exam.  The ventricles are proportionate to the degree of atrophy.  No significant extra-axial fluid collection is present.  The paranasal sinuses and mastoid air cells are clear.  The osseous skull is intact.  Atherosclerotic calcifications are noted within the cavernous carotid arteries bilaterally.  IMPRESSION:  1.  Stable atrophy and white matter disease.  This likely reflects the sequelae of chronic microvascular ischemia. 2.  No acute intracranial abnormality or significant  interval change.   Original Report Authenticated By: Jamesetta Orleans. MATTERN, M.D.    No diagnosis found.  Date: 11/15/2011  Rate: 77  Rhythm: normal sinus rhythm  QRS Axis:  left  Intervals: normal  ST/T Wave abnormalities: normal  Conduction Disutrbances:none  Narrative Interpretation:   Old EKG Reviewed: none available   MDM    Hx and PE c/w TIA.   UTI also noted.  Rocephin one gram iv.  Discussed c hospitalist.  admit     Donnetta Hutching, MD 11/15/11 [phone]

## 2011-11-16 LAB — GLUCOSE, CAPILLARY
Glucose-Capillary: 101 mg/dL — ABNORMAL HIGH (ref 70–99)
Glucose-Capillary: 101 mg/dL — ABNORMAL HIGH (ref 70–99)
Glucose-Capillary: 102 mg/dL — ABNORMAL HIGH (ref 70–99)
Glucose-Capillary: 187 mg/dL — ABNORMAL HIGH (ref 70–99)
Glucose-Capillary: 83 mg/dL (ref 70–99)
Glucose-Capillary: 94 mg/dL (ref 70–99)

## 2011-11-16 LAB — HEMOGLOBIN A1C
Hgb A1c MFr Bld: 5.8 % — ABNORMAL HIGH (ref <5.7)
Mean Plasma Glucose: 120 mg/dL — ABNORMAL HIGH (ref <117)

## 2011-11-16 LAB — TSH: TSH: 6.45 u[IU]/mL — ABNORMAL HIGH (ref 0.350–4.500)

## 2011-11-16 MED ORDER — LEVOTHYROXINE SODIUM 100 MCG PO TABS
100.0000 ug | ORAL_TABLET | Freq: Every day | ORAL | Status: DC
  Administered 2011-11-17: 100 ug via ORAL
  Filled 2011-11-16 (×2): qty 1

## 2011-11-16 MED ORDER — LISINOPRIL 40 MG PO TABS
40.0000 mg | ORAL_TABLET | Freq: Every day | ORAL | Status: DC
  Administered 2011-11-16 – 2011-11-17 (×2): 40 mg via ORAL
  Filled 2011-11-16 (×2): qty 1

## 2011-11-16 MED ORDER — LEVOTHYROXINE SODIUM 25 MCG PO TABS
25.0000 ug | ORAL_TABLET | Freq: Once | ORAL | Status: AC
  Administered 2011-11-16: 25 ug via ORAL
  Filled 2011-11-16: qty 1

## 2011-11-16 MED ORDER — ATORVASTATIN CALCIUM 20 MG PO TABS
20.0000 mg | ORAL_TABLET | Freq: Every day | ORAL | Status: DC
  Administered 2011-11-16: 20 mg via ORAL
  Filled 2011-11-16 (×2): qty 1

## 2011-11-16 MED ORDER — CLOPIDOGREL BISULFATE 75 MG PO TABS
75.0000 mg | ORAL_TABLET | Freq: Every day | ORAL | Status: DC
  Administered 2011-11-17: 75 mg via ORAL
  Filled 2011-11-16 (×4): qty 1

## 2011-11-16 MED ORDER — HYDROCHLOROTHIAZIDE 25 MG PO TABS
25.0000 mg | ORAL_TABLET | Freq: Every day | ORAL | Status: DC
  Administered 2011-11-16: 25 mg via ORAL
  Filled 2011-11-16: qty 1

## 2011-11-16 NOTE — Progress Notes (Signed)
[facility] PROGRESS NOTE  Hannah Fischer QMV:[id number] DOB: 02-Apr-1932 DOA: 11/15/2011 PCP: Neldon Labella, MD  Assessment/Plan: 1-TIA (transient ischemic attack): No acute stroke on CT or MRI of her head. 2-D echo pending at this point. Patient symptoms has been present despite the use of aspirin. After discussion with neurology plan at this moment is still discontinue aspirin and start the patient on Plavix. Will continue also risk factors modifications.  2-HTN (hypertension): Will discontinue hydrochlorothiazide (in order to avoid any orthostatic changes to contribute to patient dizziness). Adjustment of her lisinopril for better control.  3-Diabetes mellitus: Stable. Continue current regimen.  4-Hypothyroidism: TSH elevated level of 6.45; will increase her Synthroid to 100 mcg on daily basis. Patient advised to take the medication on empty stomach and at least 40 minutes away from reflux medications.  5-Dyslipidemia: LDL of 110 patient will be started on statins. Continue niacin  6-GERD (gastroesophageal reflux disease): Continue PPI.  7-Pyuria with concerns for UTI: Cultures pending at this moment. Continue empirical Rocephin. Antibiotic day #2.  DVT: lovenox   Code Status: Full code Family Communication: Husband and son at bedside Disposition Plan:Home when medically stable. Consultants:  None  Procedures:  See below  Antibiotics:  rocephin  HPI/Subjective: Afebrile; feeling better. Denies numbness or weakness.  Objective: Filed Vitals:   11/16/11 0000 11/16/11 0209 11/16/11 0520 11/16/11 1400  BP:  119/83 158/84 105/60  Pulse:  80 70 62  Temp: 98.1 F (36.7 C) 98.2 F (36.8 C) 98 F (36.7 C) 98.2 F (36.8 C)  TempSrc:  Oral Oral Tympanic  Resp:  16 18 18   Height:      Weight:      SpO2:  100% 100% 99%    Intake/Output Summary (Last 24 hours) at 11/16/11 1828 Last data filed at 11/16/11 1700  Gross per 24 hour  Intake 1760.83 ml  Output    2200 ml  Net -439.17 ml   Filed Weights   11/15/11 2222  Weight: 81.6 kg (179 lb 14.3 oz)    Exam:   General:  No acute distress, no chest pain or shortness of breath. Afebrile  Cardiovascular: Regular rate and rhythm, no rubs, no gallops  Respiratory: CTA bilaterally  Abdomen: soft, NT, ND, positive BS  Neuro: non-focal deficit  Data Reviewed: Basic Metabolic Panel:  Lab 11/15/11 2841  NA 138  K 4.0  CL 101  CO2 26  GLUCOSE 74  BUN 20  CREATININE 0.78  CALCIUM 9.8  MG --  PHOS --   Liver Function Tests:  Lab 11/15/11 1622  AST 23  ALT 18  ALKPHOS 86  BILITOT 0.3  PROT 6.9  ALBUMIN 3.9   CBC:  Lab 11/15/11 1622  WBC 4.2  NEUTROABS 2.0  HGB 13.0  HCT 39.3  MCV 95.6  PLT 203   CBG:  Lab 11/16/11 1212 11/16/11 0746 11/16/11 0530 11/16/11 0054 11/15/11 1629  GLUCAP 94 101* 102* 101* 78      Studies: Ct Head Wo Contrast  11/15/2011  *RADIOLOGY REPORT*  Clinical Data: Transit right leg and arm weakness.  CT HEAD WITHOUT CONTRAST  Technique:  Contiguous axial images were obtained from the base of the skull through the vertex without contrast.  Comparison: CT head without contrast 10/21/2011.  Findings: No acute cortical infarct, hemorrhage, or mass lesion is present.  Mild generalized atrophy and white matter hypoattenuation is similar to the prior exam.  The ventricles are proportionate to the degree of atrophy.  No significant extra-axial fluid  collection is present.  The paranasal sinuses and mastoid air cells are clear.  The osseous skull is intact.  Atherosclerotic calcifications are noted within the cavernous carotid arteries bilaterally.  IMPRESSION:  1.  Stable atrophy and white matter disease.  This likely reflects the sequelae of chronic microvascular ischemia. 2.  No acute intracranial abnormality or significant interval change.   Original Report Authenticated By: Jamesetta Orleans. MATTERN, M.D.    Mr Maxine Glenn Head Wo Contrast  11/15/2011  *RADIOLOGY  REPORT*  Clinical Data:  TIA.  Right arm and leg weakness earlier today. The symptoms have since resolved.  Recent episodes of dizziness.  MRI HEAD WITHOUT CONTRAST MRA HEAD WITHOUT CONTRAST  Technique:  Multiplanar, multiecho pulse sequences of the brain and surrounding structures were obtained without intravenous contrast. Angiographic images of the head were obtained using MRA technique without contrast.  Comparison:  CT head without contrast 11/15/2011.  Carotid Doppler ultrasound 10/25/2011.  MRI HEAD  Findings:  The diffusion weighted images demonstrate no evidence for acute or subacute infarction.  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  No hemorrhage or mass lesion is present.  Mild generalized atrophy is present.  Mild periventricular and subcortical white matter disease is present bilaterally.  Flow is present in the major intracranial arteries.  The ventricles are proportionate to the degree of atrophy.  No significant extra- axial fluid collection is present.  The  The patient is status post right lens extraction.  The globes and orbits are intact.  The paranasal sinuses are clear.  Minimal fluid is present in the mastoid air cells.  No obstructing nasopharyngeal lesion is evident.  IMPRESSION:  1.  No acute intracranial abnormality. 2.  Atrophy and mild diffuse white matter disease.  This likely reflects the sequelae of chronic microvascular ischemia. 3.  Minimal fluid in the mastoid air cells.  No obstructing nasopharyngeal lesion is present.  MRA HEAD  Findings: A 2 mm aneurysm is projected posteriorly from anterior genu of the left cavernous carotid artery.  This is extradural. Mild irregularity is present in the more distal cavernous and supraclinoid left internal carotid artery.  The left A1 segment is aplastic.  Anterior communicating artery is patent.  The MCA bifurcations are within normal limits bilaterally.  There is mild attenuation of distal MCA branch vessels.  The left vertebral artery is  dominant.  The right vertebral artery terminates at the PICA.  The basilar arteries within normal limits. Both posterior cerebral arteries originate from basilar tip.  There is some attenuation of distal PCA branch vessels.  IMPRESSION:  1.  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  This is extradural. 2.  Mild small vessel disease.   Original Report Authenticated By: Jamesetta Orleans. MATTERN, M.D.    Mr Brain Wo Contrast  11/15/2011  *RADIOLOGY REPORT*  Clinical Data:  TIA.  Right arm and leg weakness earlier today. The symptoms have since resolved.  Recent episodes of dizziness.  MRI HEAD WITHOUT CONTRAST MRA HEAD WITHOUT CONTRAST  Technique:  Multiplanar, multiecho pulse sequences of the brain and surrounding structures were obtained without intravenous contrast. Angiographic images of the head were obtained using MRA technique without contrast.  Comparison:  CT head without contrast 11/15/2011.  Carotid Doppler ultrasound 10/25/2011.  MRI HEAD  Findings:  The diffusion weighted images demonstrate no evidence for acute or subacute infarction.  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  No hemorrhage or mass lesion is present.  Mild generalized atrophy is present.  Mild periventricular and subcortical  white matter disease is present bilaterally.  Flow is present in the major intracranial arteries.  The ventricles are proportionate to the degree of atrophy.  No significant extra- axial fluid collection is present.  The  The patient is status post right lens extraction.  The globes and orbits are intact.  The paranasal sinuses are clear.  Minimal fluid is present in the mastoid air cells.  No obstructing nasopharyngeal lesion is evident.  IMPRESSION:  1.  No acute intracranial abnormality. 2.  Atrophy and mild diffuse white matter disease.  This likely reflects the sequelae of chronic microvascular ischemia. 3.  Minimal fluid in the mastoid air cells.  No obstructing nasopharyngeal lesion is present.  MRA HEAD   Findings: A 2 mm aneurysm is projected posteriorly from anterior genu of the left cavernous carotid artery.  This is extradural. Mild irregularity is present in the more distal cavernous and supraclinoid left internal carotid artery.  The left A1 segment is aplastic.  Anterior communicating artery is patent.  The MCA bifurcations are within normal limits bilaterally.  There is mild attenuation of distal MCA branch vessels.  The left vertebral artery is dominant.  The right vertebral artery terminates at the PICA.  The basilar arteries within normal limits. Both posterior cerebral arteries originate from basilar tip.  There is some attenuation of distal PCA branch vessels.  IMPRESSION:  1.  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  This is extradural. 2.  Mild small vessel disease.   Original Report Authenticated By: Jamesetta Orleans. MATTERN, M.D.     Scheduled Meds:   . atorvastatin  20 mg Oral q1800  . cefTRIAXone (ROCEPHIN)  IV  1 g Intravenous Once  . cefTRIAXone (ROCEPHIN)  IV  1 g Intravenous Q24H  . clopidogrel  75 mg Oral Q breakfast  . enoxaparin  40 mg Subcutaneous Q24H  . gabapentin  100 mg Oral TID  . glipiZIDE  10 mg Oral Q breakfast  . insulin aspart  0-9 Units Subcutaneous Q4H  . levothyroxine  100 mcg Oral Q breakfast  . levothyroxine  25 mcg Oral Once  . lisinopril  40 mg Oral Daily  . pantoprazole  40 mg Oral Q0600  . pioglitazone  45 mg Oral Daily  . potassium chloride SA  20 mEq Oral Daily  . sodium chloride  3 mL Intravenous Q12H  . vitamin B-12  100 mcg Oral Daily  . DISCONTD: aspirin  325 mg Oral Daily  . DISCONTD: hydrochlorothiazide  25 mg Oral Daily  . DISCONTD: hydrochlorothiazide  25 mg Oral Daily  . DISCONTD: levothyroxine  75 mcg Oral Q breakfast  . DISCONTD: lisinopril  20 mg Oral Daily  . DISCONTD: lisinopril-hydrochlorothiazide  1 tablet Oral Daily  . DISCONTD: niacin  500 mg Oral BID WC   Continuous Infusions:   . sodium chloride 20 mL/hr (11/16/11  0850)     Time spent: >30 minutes    Dresean Beckel  [facility] Pager [phone]. If 8PM-8AM, please contact night-coverage at [email or web address], password [facility] 11/16/2011, 6:28 PM  LOS: 1 day

## 2011-11-16 NOTE — Progress Notes (Signed)
*  PRELIMINARY RESULTS* Echocardiogram 2D Echocardiogram has been performed.  Hawthorne Day 11/16/2011, 9:26 AM

## 2011-11-17 DIAGNOSIS — I10 Essential (primary) hypertension: Secondary | ICD-10-CM

## 2011-11-17 DIAGNOSIS — E039 Hypothyroidism, unspecified: Secondary | ICD-10-CM

## 2011-11-17 DIAGNOSIS — E119 Type 2 diabetes mellitus without complications: Secondary | ICD-10-CM

## 2011-11-17 DIAGNOSIS — E785 Hyperlipidemia, unspecified: Secondary | ICD-10-CM

## 2011-11-17 DIAGNOSIS — N390 Urinary tract infection, site not specified: Secondary | ICD-10-CM

## 2011-11-17 DIAGNOSIS — G459 Transient cerebral ischemic attack, unspecified: Principal | ICD-10-CM

## 2011-11-17 LAB — URINE CULTURE
Colony Count: 35000
Colony Count: NO GROWTH
Culture: NO GROWTH
Special Requests: NORMAL

## 2011-11-17 LAB — GLUCOSE, CAPILLARY
Glucose-Capillary: 54 mg/dL — ABNORMAL LOW (ref 70–99)
Glucose-Capillary: 71 mg/dL (ref 70–99)
Glucose-Capillary: 99 mg/dL (ref 70–99)
Glucose-Capillary: 104 mg/dL — ABNORMAL HIGH (ref 70–99)
Glucose-Capillary: 124 mg/dL — ABNORMAL HIGH (ref 70–99)

## 2011-11-17 LAB — BASIC METABOLIC PANEL
BUN: 17 mg/dL (ref 6–23)
CO2: 30 mEq/L (ref 19–32)
Calcium: 9.4 mg/dL (ref 8.4–10.5)
Chloride: 101 mEq/L (ref 96–112)
Creatinine, Ser: 0.8 mg/dL (ref 0.50–1.10)
GFR calc Af Amer: 81 mL/min — ABNORMAL LOW (ref >90)
GFR calc non Af Amer: 70 mL/min — ABNORMAL LOW (ref >90)
Glucose, Bld: 92 mg/dL (ref 70–99)
Potassium: 4.1 mEq/L (ref 3.5–5.1)
Sodium: 139 mEq/L (ref 135–145)

## 2011-11-17 MED ORDER — PRAVASTATIN SODIUM 20 MG PO TABS: 20.0000 mg | ORAL_TABLET | Freq: Every day | ORAL | Status: DC

## 2011-11-17 MED ORDER — CLOPIDOGREL BISULFATE 75 MG PO TABS: 75.0000 mg | ORAL_TABLET | Freq: Every day | ORAL | Status: DC

## 2011-11-17 MED ORDER — LEVOTHYROXINE SODIUM 100 MCG PO TABS: 100.0000 ug | ORAL_TABLET | Freq: Every day | ORAL | Status: DC

## 2011-11-17 MED ORDER — LISINOPRIL 40 MG PO TABS: 40.0000 mg | ORAL_TABLET | Freq: Every day | ORAL | Status: DC

## 2011-11-17 MED ORDER — CEFUROXIME AXETIL 250 MG PO TABS: 250.0000 mg | ORAL_TABLET | Freq: Two times a day (BID) | ORAL | Status: AC

## 2011-11-17 NOTE — Evaluation (Signed)
Physical Therapy One Time Evaluation Patient Details Name: Hannah Fischer MRN: [id number] DOB: [birth year] Today's Date: 11/17/2011 Time: 1052-1100 PT Time Calculation (min): 8 min  PT Assessment / Plan / Recommendation Clinical Impression  Pt admitted for possible recurrent TIAs.  Pt now reports strength and mobility back to baseline.  Pt reports she is to start outpatient therapy on Monday and did not have any questions/concerns about d/c home.      PT Assessment  All further PT needs can be met in the next venue of care    Follow Up Recommendations  Outpatient PT    Does the patient have the potential to tolerate intense rehabilitation      Barriers to Discharge        Equipment Recommendations  None recommended by PT    Recommendations for Other Services     Frequency      Precautions / Restrictions Precautions Precautions: None Restrictions Weight Bearing Restrictions: No   Pertinent Vitals/Pain No pain     Mobility  Transfers Transfers: Sit to Stand;Stand to Sit Sit to Stand: 6: Modified independent (Device/Increase time) Stand to Sit: 6: Modified independent (Device/Increase time) Ambulation/Gait Ambulation/Gait Assistance: 5: Supervision Ambulation Distance (Feet): 250 Feet Assistive device: None Ambulation/Gait Assistance Details: pt able to start/stop without LOB however did become unsteady with head turns but no physical assist to correct required Gait Pattern: Step-through pattern    Shoulder Instructions     Exercises     PT Diagnosis:    PT Problem List: Decreased strength;Decreased balance;Decreased range of motion PT Treatment Interventions:     PT Goals    Visit Information  Last PT Received On: 11/17/11 Assistance Needed: +1    Subjective Data  Subjective: I'm much better.   Prior Functioning  Home Living Lives With: Spouse Type of Home: House Home Access: Stairs to enter Secretary/administrator of Steps: 3 Home Layout: One  level Home Adaptive Equipment: Other (comment) ("walking stick") Prior Function Level of Independence: Independent Communication Communication: No difficulties    Cognition  Overall Cognitive Status: Appears within functional limits for tasks assessed/performed Arousal/Alertness: Awake/alert Orientation Level: Appears intact for tasks assessed Behavior During Session: [facility] for tasks performed    Extremity/Trunk Assessment Right Upper Extremity Assessment RUE ROM/Strength/Tone: [facility] for tasks assessed Left Upper Extremity Assessment LUE ROM/Strength/Tone: Deficits LUE ROM/Strength/Tone Deficits: weak shoulder with decreased AROM (prior to admit), elbow and distally WFL Right Lower Extremity Assessment RLE ROM/Strength/Tone: Within functional levels Left Lower Extremity Assessment LLE ROM/Strength/Tone: Within functional levels   Balance    End of Session PT - End of Session Activity Tolerance: Patient tolerated treatment well Patient left: in chair;with call bell/phone within reach Nurse Communication: Mobility status  GP     Sallyann Kinnaird,KATHrine E 11/17/2011, 12:01 PM Pager: [phone]

## 2011-11-17 NOTE — Discharge Summary (Signed)
Physician Discharge Summary  Hannah Fischer JXB:[id number] DOB: 15-Feb-1932 DOA: 11/15/2011  PCP: Neldon Labella, MD  Admit date: 11/15/2011 Discharge date: 11/17/2011  Recommendations for Outpatient Follow-up:  1. Follow up with PCP in 2 weeks (Recheck BP and adjust medication as needed; also repeat Free T4 and TSH in 4 weeks to follow thyroid disease and synthroid adjustments; follow HLD)  Discharge Diagnoses:  Active Problems:  TIA (transient ischemic attack)  HTN (hypertension)  Diabetes mellitus  Hypothyroidism  Dyslipidemia  GERD (gastroesophageal reflux disease)  Pyuria   Discharge Condition: stable and improved. Patient will go home and has been advised to follow discharge instructions and medications as prescribed. Follow up with PCP in 2 weeks  Diet recommendation: low sodium and low fat diet  Filed Weights   11/15/11 2222  Weight: 81.6 kg (179 lb 14.3 oz)    History of present illness:  This is a 78 year old female, with history of HTN, diabetes mellitus with neuropathy, GERD, hypothyroidism, dyslipidemia, anxiety, IBS, diverticular disease, chronic anemia, right kidney agenesis, bilateral PE 2010, treated with Coumadin anticoagulation for one year (discontinued in 2011). According to patient, she had gone out shopping with her spouse and had felt quite fine all day. The ended up at the dollar store at about 3:30 PM, and while she was standing in line to pay at the cashier's, she found she was suddenly unable to move her right leg, when it came to her turn. Her right arm felt very heavy, and she dropped the objects she was holding. She had no chest pain or palpitations, no visual obscuration, and her speech was normal. She informed her husband that she needed to go to the hospital, he helped her outside, and drove her to the ED. On arrival, she was already feeling better, and within the hour, all symptoms had resolved. It appears that patient had a similar episode,  involving only the right arm about 2 weeks ago, and 2 weeks prior to that , had transient LUE weakness. She has been experiencing dizziness for the past one month, and on 10/24/11, after walking from the bedroom to the bathroom, she had an episode of dizziness, without chest pain or palpitations, fell backwards into the bathtub, and banged her head. She saw her PMD on 10/25/11, and had a carotid/vertebral artery duplex done.   Hospital Course:  1-TIA (transient ischemic attack): No acute stroke on CT or MRI of her head. 2-D echo demonstarting no source of emboli and preserved EF; just grade 1 diastolic dysfunction. Patient symptoms has been present despite the use of aspirin. After discussion with neurology plan at this moment is to discontinue aspirin and start the patient on Plavix. Will continue also risk factors modifications with better control of BP and HLD.   2-HTN (hypertension): Will discontinue hydrochlorothiazide (in order to avoid any orthostatic changes to contribute to patient dizziness). Adjustment of her lisinopril for better control and also recommendation for patient to follow a low sodium diet.   3-Diabetes mellitus: Stable. Continue current regimen.   4-Hypothyroidism: TSH elevated level of 6.45; will increase her Synthroid to 100 mcg on daily basis. Patient advised to take the medication on empty stomach and at least 40 minutes away from reflux medications. Will need TSH and free T4 repeated in 4 weeks by PCP.  5-Dyslipidemia: LDL of 110 patient will be started on statins, pravachol low dose. Continue niacin and low fat diet  6-GERD (gastroesophageal reflux disease): Continue PPI. Marland Kitchen  7-Pyuria with concerns  for UTI: Cultures w/o growth; antibiotics given in ED before urine cx sent. But patient was initially with dysuria and increased frequency. Will treat with 5 days of antibiotics.   Procedures: 2-D echo - Left ventricle: The cavity size was normal. There was mild concentric  hypertrophy. Systolic function was normal. The estimated ejection fraction was in the range of 55% to 60%. There is akinesis of the basalinferior myocardium. Doppler parameters are consistent with abnormal left ventricular relaxation (grade 1 diastolic dysfunction). - Mitral valve: Calcified annulus. Mildly thickened, mildly calcified leaflets . Mild regurgitation. - Pulmonary arteries: PA peak pressure: 33mm Hg (S).  CT head and MRI brain: see results below. (no acute intracranial abnormalities)     Discharge Exam: Filed Vitals:   11/16/11 1400 11/16/11 2210 11/17/11 0235 11/17/11 0623  BP: 105/60 144/55 133/66 142/71  Pulse: 62 67 65 66  Temp: 98.2 F (36.8 C) 98.4 F (36.9 C) 97.6 F (36.4 C) 98.2 F (36.8 C)  TempSrc: Tympanic Oral Oral Oral  Resp: 18 20 16 18   Height:      Weight:      SpO2: 99% 100% 96% 98%    General: NAD; afebrile; no CP or SOB Cardiovascular: S1 and S2; no rubs or gallops; regular rate Respiratory: CTA bilaterally Abdomen: soft, NT, ND, positive BS Extremities: no edema or cyanosis Neuro: non focal deficit   Discharge Instructions  Discharge Orders    Future Orders Please Complete By Expires   Discharge instructions      Comments:   -arrange follow up with PCP in 2 weeks -take medications as prescribed -keep yourself well hydrated -follow a low sodium and low fat diet       Medication List     As of 11/17/2011 12:02 PM    STOP taking these medications         aspirin EC 81 MG tablet      lisinopril-hydrochlorothiazide 20-25 MG per tablet   Commonly known as: PRINZIDE,ZESTORETIC      potassium chloride SA 20 MEQ tablet   Commonly known as: K-DUR,KLOR-CON      TAKE these medications         ALPRAZolam 0.25 MG tablet   Commonly known as: XANAX   Take 0.25 mg by mouth at bedtime as needed. For sleep/anxiety      cefUROXime 250 MG tablet   Commonly known as: CEFTIN   Take 1 tablet (250 mg total) by mouth 2 (two) times  daily.      clopidogrel 75 MG tablet   Commonly known as: PLAVIX   Take 1 tablet (75 mg total) by mouth daily with breakfast.      gabapentin 100 MG capsule   Commonly known as: NEURONTIN   Take 100 mg by mouth 3 (three) times daily.      glipiZIDE 10 MG 24 hr tablet   Commonly known as: GLUCOTROL XL   Take 10 mg by mouth daily.      HYDROcodone-acetaminophen 5-500 MG per tablet   Commonly known as: VICODIN   Take 1 tablet by mouth every 6 (six) hours as needed. For pain      KRILL OIL PO   Take 1 capsule by mouth daily.      levothyroxine 100 MCG tablet   Commonly known as: SYNTHROID, LEVOTHROID   Take 1 tablet (100 mcg total) by mouth daily.      lisinopril 40 MG tablet   Commonly known as: PRINIVIL,ZESTRIL   Take 1 tablet (  40 mg total) by mouth daily.      metFORMIN 1000 MG tablet   Commonly known as: GLUCOPHAGE   Take 1,000 mg by mouth 2 (two) times daily with a meal.      niacin 500 MG tablet   Commonly known as: SLO-NIACIN   Take 500 mg by mouth 2 (two) times daily with a meal.      omeprazole 20 MG capsule   Commonly known as: PRILOSEC   Take 20 mg by mouth daily.      pioglitazone 45 MG tablet   Commonly known as: ACTOS   Take 45 mg by mouth daily.      pravastatin 20 MG tablet   Commonly known as: PRAVACHOL   Take 1 tablet (20 mg total) by mouth daily.      VITAMIN B-12 PO   Take 1 tablet by mouth daily.           Follow-up Information    Follow up with Neldon Labella, MD. Schedule an appointment as soon as possible for a visit in 2 weeks.   Contact information:   [address] [place] [address] [phone]           The results of significant diagnostics from this hospitalization (including imaging, microbiology, ancillary and laboratory) are listed below for reference.    Significant Diagnostic Studies: Ct Head Wo Contrast  11/15/2011  *RADIOLOGY REPORT*  Clinical Data: Transit right leg and arm weakness.  CT HEAD WITHOUT  CONTRAST  Technique:  Contiguous axial images were obtained from the base of the skull through the vertex without contrast.  Comparison: CT head without contrast 10/21/2011.  Findings: No acute cortical infarct, hemorrhage, or mass lesion is present.  Mild generalized atrophy and white matter hypoattenuation is similar to the prior exam.  The ventricles are proportionate to the degree of atrophy.  No significant extra-axial fluid collection is present.  The paranasal sinuses and mastoid air cells are clear.  The osseous skull is intact.  Atherosclerotic calcifications are noted within the cavernous carotid arteries bilaterally.  IMPRESSION:  1.  Stable atrophy and white matter disease.  This likely reflects the sequelae of chronic microvascular ischemia. 2.  No acute intracranial abnormality or significant interval change.   Original Report Authenticated By: Jamesetta Orleans. MATTERN, M.D.    Ct Head Wo Contrast  10/21/2011  *RADIOLOGY REPORT*  Clinical Data: Larey Seat and hit posterior head.  Head pain. Tenderness.  CT HEAD WITHOUT CONTRAST  Technique:  Contiguous axial images were obtained from the base of the skull through the vertex without contrast.  Comparison: None.  Findings: There is no evidence for acute infarction, intracranial hemorrhage, mass lesion, hydrocephalus, or extra-axial fluid. Slight atrophy. Mild chronic microvascular ischemic change.  Mega cisterna magna.  There is a moderate scalp hematoma in the midline near the vertex. No skull fracture. There is no radiopaque body associated. Negative orbits, sinuses, and mastoids.  Moderate vascular calcification.  IMPRESSION: Scalp hematoma in the midline near the vertex.  No skull fracture or intracranial hemorrhage.   Original Report Authenticated By: Elsie Stain, M.D.    Mr [facility] Wo Contrast  11/15/2011  *RADIOLOGY REPORT*  Clinical Data:  TIA.  Right arm and leg weakness earlier today. The symptoms have since resolved.  Recent episodes of  dizziness.  MRI HEAD WITHOUT CONTRAST MRA HEAD WITHOUT CONTRAST  Technique:  Multiplanar, multiecho pulse sequences of the brain and surrounding structures were obtained without intravenous contrast. Angiographic images  of the head were obtained using MRA technique without contrast.  Comparison:  CT head without contrast 11/15/2011.  Carotid Doppler ultrasound 10/25/2011.  MRI HEAD  Findings:  The diffusion weighted images demonstrate no evidence for acute or subacute infarction.  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  No hemorrhage or mass lesion is present.  Mild generalized atrophy is present.  Mild periventricular and subcortical white matter disease is present bilaterally.  Flow is present in the major intracranial arteries.  The ventricles are proportionate to the degree of atrophy.  No significant extra- axial fluid collection is present.  The  The patient is status post right lens extraction.  The globes and orbits are intact.  The paranasal sinuses are clear.  Minimal fluid is present in the mastoid air cells.  No obstructing nasopharyngeal lesion is evident.  IMPRESSION:  1.  No acute intracranial abnormality. 2.  Atrophy and mild diffuse white matter disease.  This likely reflects the sequelae of chronic microvascular ischemia. 3.  Minimal fluid in the mastoid air cells.  No obstructing nasopharyngeal lesion is present.  MRA HEAD  Findings: A 2 mm aneurysm is projected posteriorly from anterior genu of the left cavernous carotid artery.  This is extradural. Mild irregularity is present in the more distal cavernous and supraclinoid left internal carotid artery.  The left A1 segment is aplastic.  Anterior communicating artery is patent.  The MCA bifurcations are within normal limits bilaterally.  There is mild attenuation of distal MCA branch vessels.  The left vertebral artery is dominant.  The right vertebral artery terminates at the PICA.  The basilar arteries within normal limits. Both posterior  cerebral arteries originate from basilar tip.  There is some attenuation of distal PCA branch vessels.  IMPRESSION:  1.  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  This is extradural. 2.  Mild small vessel disease.   Original Report Authenticated By: Jamesetta Orleans. MATTERN, M.D.    Mr Brain Wo Contrast  11/15/2011  *RADIOLOGY REPORT*  Clinical Data:  TIA.  Right arm and leg weakness earlier today. The symptoms have since resolved.  Recent episodes of dizziness.  MRI HEAD WITHOUT CONTRAST MRA HEAD WITHOUT CONTRAST  Technique:  Multiplanar, multiecho pulse sequences of the brain and surrounding structures were obtained without intravenous contrast. Angiographic images of the head were obtained using MRA technique without contrast.  Comparison:  CT head without contrast 11/15/2011.  Carotid Doppler ultrasound 10/25/2011.  MRI HEAD  Findings:  The diffusion weighted images demonstrate no evidence for acute or subacute infarction.  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  No hemorrhage or mass lesion is present.  Mild generalized atrophy is present.  Mild periventricular and subcortical white matter disease is present bilaterally.  Flow is present in the major intracranial arteries.  The ventricles are proportionate to the degree of atrophy.  No significant extra- axial fluid collection is present.  The  The patient is status post right lens extraction.  The globes and orbits are intact.  The paranasal sinuses are clear.  Minimal fluid is present in the mastoid air cells.  No obstructing nasopharyngeal lesion is evident.  IMPRESSION:  1.  No acute intracranial abnormality. 2.  Atrophy and mild diffuse white matter disease.  This likely reflects the sequelae of chronic microvascular ischemia. 3.  Minimal fluid in the mastoid air cells.  No obstructing nasopharyngeal lesion is present.  MRA HEAD  Findings: A 2 mm aneurysm is projected posteriorly from anterior genu of the left  cavernous carotid artery.  This is  extradural. Mild irregularity is present in the more distal cavernous and supraclinoid left internal carotid artery.  The left A1 segment is aplastic.  Anterior communicating artery is patent.  The MCA bifurcations are within normal limits bilaterally.  There is mild attenuation of distal MCA branch vessels.  The left vertebral artery is dominant.  The right vertebral artery terminates at the PICA.  The basilar arteries within normal limits. Both posterior cerebral arteries originate from basilar tip.  There is some attenuation of distal PCA branch vessels.  IMPRESSION:  1.  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  This is extradural. 2.  Mild small vessel disease.   Original Report Authenticated By: Jamesetta Orleans. MATTERN, M.D.    US Carotid Duplex Bilateral  10/25/2011  *RADIOLOGY REPORT*  Clinical Data:   dizziness, left hand numbness, diabetes  BILATERAL CAROTID DUPLEX ULTRASOUND  Technique: Gray scale imaging, color Doppler and duplex ultrasound was performed of bilateral carotid and vertebral arteries in the neck.  Comparison:   none  Criteria:  Quantification of carotid stenosis is based on velocity parameters that correlate the residual internal carotid diameter with NASCET-based stenosis levels, using the diameter of the distal internal carotid lumen as the denominator for stenosis measurement.  The following velocity measurements were obtained:                   PEAK SYSTOLIC/END DIASTOLIC RIGHT ICA:                        58/16cm/sec CCA:                        85/16cm/sec SYSTOLIC ICA/CCA RATIO:     0.69 DIASTOLIC ICA/CCA RATIO:    1.0 ECA:                        117cm/sec  LEFT ICA:                        70/19cm/sec CCA:                        100/16cm/sec SYSTOLIC ICA/CCA RATIO:     0.7 DIASTOLIC ICA/CCA RATIO:    1.14 ECA:                        70cm/sec  Findings:  RIGHT CAROTID ARTERY: Mild irregular plaque in the common carotid artery with some wall thickening, no high-grade stenosis.   There is eccentric partially calcified plaque in the carotid bulb and proximal ICA without high-grade stenosis.  Normal wave forms and color Doppler signal.  RIGHT VERTEBRAL ARTERY:  Normal flow direction and waveform.  LEFT CAROTID ARTERY: Intimal thickening through the common carotid artery and bulb.  There is partially calcified eccentric plaque in the proximal ICA without high-grade stenosis.  Normal wave forms and color Doppler signal.  LEFT VERTEBRAL ARTERY:  Normal flow direction and waveform.  IMPRESSION:  1.  Bilateral carotid bifurcation and proximal ICA plaque, resulting in less than 50% diameter stenosis. The exam does not exclude plaque ulceration or embolization.  Continued surveillance recommended.   Original Report Authenticated By: Osa Craver, M.D.     Microbiology: Recent Results (from the past 240 hour(s))  URINE CULTURE     Status: Normal   Collection Time  11/15/11  6:38 PM      Component Value Range Status Comment   Specimen Description URINE, CATHETERIZED   Final    Special Requests Normal   Final    Culture  Setup Time 11/16/2011 01:39   Final    Colony Count 35,000 COLONIES/ML   Final    Culture     Final    Value: Multiple bacterial morphotypes present, none predominant. Suggest appropriate recollection if clinically indicated.   Report Status 11/17/2011 FINAL   Final   URINE CULTURE     Status: Normal   Collection Time   11/16/11  1:29 AM      Component Value Range Status Comment   Specimen Description URINE, RANDOM   Final    Special Requests NONE   Final    Culture  Setup Time 11/16/2011 05:40   Final    Colony Count NO GROWTH   Final    Culture NO GROWTH   Final    Report Status 11/17/2011 FINAL   Final      Labs: Basic Metabolic Panel:  Lab 11/17/11 1610 11/15/11 1622  NA 139 138  K 4.1 4.0  CL 101 101  CO2 30 26  GLUCOSE 92 74  BUN 17 20  CREATININE 0.80 0.78  CALCIUM 9.4 9.8  MG -- --  PHOS -- --   Liver Function Tests:  Lab  11/15/11 1622  AST 23  ALT 18  ALKPHOS 86  BILITOT 0.3  PROT 6.9  ALBUMIN 3.9   CBC:  Lab 11/15/11 1622  WBC 4.2  NEUTROABS 2.0  HGB 13.0  HCT 39.3  MCV 95.6  PLT 203   CBG:  Lab 11/17/11 0753 11/17/11 0359 11/16/11 2356 11/16/11 2338 11/16/11 2038  GLUCAP 104* 99 71 54* 187*    Time coordinating discharge: >30 minutes  Signed:  Violet Cart  [facility] 11/17/2011, 12:02 PM

## 2011-11-17 NOTE — Progress Notes (Signed)
Hypoglycemic Event  CBG: 56    Treatment: 15 GM carbohydrate snack  Symptoms: Sweaty and Shaky  Follow-up CBG: Time: 2355 CBG Result:71  Possible Reasons for Event: Medication regimen: pt recieved 2 units of novolog at 2000 for CBG of 187, pt states she never takes insulin at home and controls her blood sugar with food       Tiburcio Pea, Genia Harold  Remember to initiate Hypoglycemia Order Set & complete

## 2013-08-27 ENCOUNTER — Ambulatory Visit
Admission: RE | Admit: 2013-08-27 | Discharge: 2013-08-27 | Disposition: A | Discharge status: Home / Self Care | Payer: Medicare Other | Source: Ambulatory Visit | Attending: Family Medicine | Admitting: Family Medicine

## 2013-08-27 ENCOUNTER — Other Ambulatory Visit: Payer: Self-pay | Admitting: Family Medicine

## 2013-08-27 DIAGNOSIS — R0789 Other chest pain: Secondary | ICD-10-CM

## 2013-08-27 MED ORDER — IOHEXOL 350 MG/ML SOLN
100.0000 mL | Freq: Once | INTRAVENOUS | Status: AC | PRN
  Administered 2013-08-27: 100 mL via INTRAVENOUS

## 2013-10-06 ENCOUNTER — Other Ambulatory Visit: Payer: Self-pay | Admitting: Orthopedic Surgery

## 2013-10-06 DIAGNOSIS — M19019 Primary osteoarthritis, unspecified shoulder: Secondary | ICD-10-CM

## 2013-10-08 ENCOUNTER — Ambulatory Visit
Admission: RE | Admit: 2013-10-08 | Discharge: 2013-10-08 | Disposition: A | Discharge status: Home / Self Care | Payer: Medicare Other | Source: Ambulatory Visit | Attending: Orthopedic Surgery | Admitting: Orthopedic Surgery

## 2013-10-08 DIAGNOSIS — M19019 Primary osteoarthritis, unspecified shoulder: Secondary | ICD-10-CM

## 2013-10-15 ENCOUNTER — Encounter: Payer: Self-pay | Admitting: Cardiology

## 2013-10-15 ENCOUNTER — Other Ambulatory Visit: Payer: Self-pay | Admitting: Cardiology

## 2013-10-15 ENCOUNTER — Ambulatory Visit (INDEPENDENT_AMBULATORY_CARE_PROVIDER_SITE_OTHER): Payer: Medicare Other | Admitting: Interventional Cardiology

## 2013-10-15 VITALS — BP 118/70 | HR 92 | Ht 62.0 in | Wt 180.9 lb

## 2013-10-15 DIAGNOSIS — R9431 Abnormal electrocardiogram [ECG] [EKG]: Secondary | ICD-10-CM

## 2013-10-15 DIAGNOSIS — I209 Angina pectoris, unspecified: Secondary | ICD-10-CM | POA: Insufficient documentation

## 2013-10-15 HISTORY — DX: Abnormal electrocardiogram (ECG) (EKG): R94.31

## 2013-10-15 LAB — CBC WITH DIFFERENTIAL/PLATELET
Basophils Absolute: 0.1 10*3/uL (ref 0.0–0.1)
Basophils Relative: 1.3 % (ref 0.0–3.0)
Eosinophils Absolute: 0.1 10*3/uL (ref 0.0–0.7)
Eosinophils Relative: 2.8 % (ref 0.0–5.0)
HCT: 35.2 % — ABNORMAL LOW (ref 36.0–46.0)
Hemoglobin: 11.8 g/dL — ABNORMAL LOW (ref 12.0–15.0)
Lymphocytes Relative: 37.4 % (ref 12.0–46.0)
Lymphs Abs: 1.8 10*3/uL (ref 0.7–4.0)
MCHC: 33.5 g/dL (ref 30.0–36.0)
MCV: 92 fl (ref 78.0–100.0)
Monocytes Absolute: 0.4 10*3/uL (ref 0.1–1.0)
Monocytes Relative: 8.6 % (ref 3.0–12.0)
Neutro Abs: 2.4 10*3/uL (ref 1.4–7.7)
Neutrophils Relative %: 49.9 % (ref 43.0–77.0)
Platelets: 197 10*3/uL (ref 150.0–400.0)
RBC: 3.83 Mil/uL — ABNORMAL LOW (ref 3.87–5.11)
RDW: 14.9 % (ref 11.5–15.5)
WBC: 4.7 10*3/uL (ref 4.0–10.5)

## 2013-10-15 LAB — PROTIME-INR
INR: 0.9 ratio (ref 0.8–1.0)
Prothrombin Time: 9.9 s (ref 9.6–13.1)

## 2013-10-15 LAB — BASIC METABOLIC PANEL
BUN: 19 mg/dL (ref 6–23)
CO2: 27 mEq/L (ref 19–32)
Calcium: 9 mg/dL (ref 8.4–10.5)
Chloride: 104 mEq/L (ref 96–112)
Creatinine, Ser: 0.8 mg/dL (ref 0.4–1.2)
GFR: 70.52 mL/min (ref >60.00)
Glucose, Bld: 115 mg/dL — ABNORMAL HIGH (ref 70–99)
Potassium: 4.1 mEq/L (ref 3.5–5.1)
Sodium: 140 mEq/L (ref 135–145)

## 2013-10-15 NOTE — Patient Instructions (Signed)
Your physician recommends that you return for lab work today for CBC with Diff, Bmet and PT/INR.  Your physician recommends that you continue on your current medications as directed. Please refer to the Current Medication list given to you today.  Your physician has requested that you have a cardiac catheterization. Cardiac catheterization is used to diagnose and/or treat various heart conditions. Doctors may recommend this procedure for a number of different reasons. The most common reason is to evaluate chest pain. Chest pain can be a symptom of coronary artery disease (CAD), and cardiac catheterization can show whether plaque is narrowing or blocking your heart's arteries. This procedure is also used to evaluate the valves, as well as measure the blood flow and oxygen levels in different parts of your heart. For further information please visit [email or web address] Please follow instruction sheet, as given.

## 2013-10-15 NOTE — Progress Notes (Signed)
Patient ID: Hannah Fischer, female   DOB: [birth year], 80 y.o.   MRN: [id number]     Patient ID: Hannah Fischer MRN: [id number] DOB/AGE: [birth year] 80 y.o.   Referring Physician Dr. Sigmund Hazel   Reason for Consultation chest pain  HPI:  80 y/o who has had a negative stress test in 2012.  She is diabetic for 20 years at least.  Over the past few months, she has had some chest pain, on the left side.  It can radiate to the left neck.  There can be nausea as well.  It may last  afew minutes.  She had some palpitations. It is sharp, feels like a "lightning shock."  She has not taken any medicine for this.  She is here for further evaluation.  Housework is the most strenuous activity.  THis does not bring on any sx.  It has woken her from sleep at times.    Current Outpatient Prescriptions  Medication Sig Dispense Refill  . ALPRAZolam (XANAX) 0.25 MG tablet Take 0.25 mg by mouth at bedtime as needed. For sleep/anxiety      . Cholecalciferol (VITAMIN D3) 2000 UNITS TABS Take by mouth.      . clopidogrel (PLAVIX) 75 MG tablet Take 1 tablet (75 mg total) by mouth daily with breakfast.  30 tablet  1  . Cyanocobalamin (VITAMIN B-12 PO) Take 1 tablet by mouth daily.        Marland Kitchen gabapentin (NEURONTIN) 100 MG capsule Take 100 mg by mouth 3 (three) times daily.       Marland Kitchen HYDROcodone-acetaminophen (VICODIN) 5-500 MG per tablet Take 1 tablet by mouth every 6 (six) hours as needed. For pain      . KRILL OIL PO Take 1 capsule by mouth daily.      Marland Kitchen levothyroxine (SYNTHROID, LEVOTHROID) 100 MCG tablet Take 1 tablet (100 mcg total) by mouth daily.  30 tablet  1  . losartan (COZAAR) 50 MG tablet       . metFORMIN (GLUCOPHAGE) 1000 MG tablet Take 1,000 mg by mouth 2 (two) times daily with a meal. TAKES 1500 MG TWICE DAILY      . Omega-3 Fatty Acids (FISH OIL) 1000 MG CAPS Take by mouth.      Marland Kitchen omeprazole (PRILOSEC) 20 MG capsule Take 20 mg by mouth daily.        . pioglitazone (ACTOS) 45 MG tablet Take 45 mg  by mouth daily.         No current facility-administered medications for this visit.   Past Medical History  Diagnosis Date  . Hypertension     on medication  . Seizures     once, age 38  . Complication of anesthesia     unable to breathe lying  flat on back  . Diabetes mellitus     type 2  niddm x 25 yrs  . Blood transfusion 1979    for anemia  . Anemia     chronic  . Hypothyroidism     takes levoxyl  . Arthritis     djd  . 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 Anxiety   . GERD (gastroesophageal reflux disease)   . Hyperlipemia   . Chest pain     evaluated @ [facility] cardiology; had a stress test 2012  . Diverticular disease   . Irritable bowel syndrome   . Neuromuscular disorder   . Neuropathy in diabetes   . Pulmonary  embolism 06-2010    bilateral  . DJD (degenerative joint disease)     lumbar and cervical  . Abnormal nuclear stress test     2005, normal cath Dr. Elease Hashimoto, normal stress test 2012 Dr. Eldridge Dace    No family history on file.  History   Social History  . Marital Status: Married    Spouse Name: N/A    Number of Children: N/A  . Years of Education: N/A   Occupational History  . Not on file.   Social History Main Topics  . Smoking status: Never Smoker   . Smokeless tobacco: Never Used  . Alcohol Use: No  . Drug Use: No  . Sexual Activity: No   Other Topics Concern  . Not on file   Social History Narrative  . No narrative on file    Past Surgical History  Procedure Laterality Date  . Eye surgery  2007    cataract ext/ iol rt eye  . Blepharoplasty  2012    rt eye  . Back surgery    . Lumbar disc surgery  1990  . Joint replacement  2001,2005    both knees  . Cholecystectomy    . Shoulder hemi-arthroplasty  12/25/2010    Procedure: SHOULDER HEMI-ARTHROPLASTY;  Surgeon: Cammy Copa;  Location: [facility] OR;  Service: Orthopedics;  Laterality: Right;      (Not in a hospital admission)  Review of systems complete and  found to be negative unless listed above .  No nausea, vomiting.  No fever chills, No focal weakness,  No palpitations.  Physical Exam: Filed Vitals:   10/15/13 1432  BP: 118/70  Pulse: 92    Weight: 180 lb 14.4 oz (82.056 kg)  Physical exam:  [place]/AT EOMI No JVD, No carotid bruit RRR S1S2  No wheezing Soft. NT, nondistended No edema. No focal motor or sensory deficits Normal affect  Labs:   Lab Results  Component Value Date   WBC 4.2 11/15/2011   HGB 13.0 11/15/2011   HCT 39.3 11/15/2011   MCV 95.6 11/15/2011   PLT 203 11/15/2011   No results found for this basename: NA, K, CL, CO2, BUN, CREATININE, CALCIUM, LABALBU, PROT, BILITOT, ALKPHOS, ALT, AST, GLUCOSE,  in the last 168 hours No results found for this basename: CKTOTAL, CKMB, CKMBINDEX, TROPONINI    Lab Results  Component Value Date   CHOL 207* 11/15/2011   CHOL  Value: 145        ATP III CLASSIFICATION:  <200     mg/dL   Desirable  161-096  mg/dL   Borderline High  >=045    mg/dL   High        40/10/8117   Lab Results  Component Value Date   HDL 66 11/15/2011   HDL 49 11/04/2009   Lab Results  Component Value Date   LDLCALC 110* 11/15/2011   LDLCALC  Value: 75        Total Cholesterol/HDL:CHD Risk Coronary Heart Disease Risk Table                     Men   Women  1/2 Average Risk   3.4   3.3  Average Risk       5.0   4.4  2 X Average Risk   9.6   7.1  3 X Average Risk  23.4   11.0        Use the calculated Patient Ratio above and the CHD Risk  Table to determine the patient's CHD Risk.        ATP III CLASSIFICATION (LDL):  <100     mg/dL   Optimal  469-629  mg/dL   Near or Above                    Optimal  130-159  mg/dL   Borderline  528-413  mg/dL   High  >244     mg/dL   Very High 01/0/2725   Lab Results  Component Value Date   TRIG 157* 11/15/2011   TRIG 107 11/04/2009   Lab Results  Component Value Date   CHOLHDL 3.1 11/15/2011   CHOLHDL 3.0 11/04/2009   No results found for this basename: LDLDIRECT       EKG: NSR, septal MI  ASSESSMENT AND PLAN:  Chest pain:  Some concerning featues for angina.  Abnormal ECG.   She has DM as well.  Upcoming shoulder surgery planned.  WOuld use BMS if needed.  Prior negative stress in 2012.   No bleeding issues.  Procedure explained.  Risks and benefits discussed.  Questions answered.  Signed:   Fredric Mare, MD, [facility] 10/15/2013, 3:18 PM

## 2013-10-18 ENCOUNTER — Ambulatory Visit (HOSPITAL_COMMUNITY)
Admission: RE | Admit: 2013-10-18 | Discharge: 2013-10-18 | Disposition: A | Discharge status: Home / Self Care | Payer: Medicare Other | Source: Ambulatory Visit | Attending: Interventional Cardiology | Admitting: Interventional Cardiology

## 2013-10-18 ENCOUNTER — Encounter (HOSPITAL_COMMUNITY)
Admission: RE | Disposition: A | Discharge status: Home / Self Care | Payer: Self-pay | Source: Ambulatory Visit | Attending: Interventional Cardiology

## 2013-10-18 ENCOUNTER — Encounter (HOSPITAL_COMMUNITY): Payer: Self-pay | Admitting: Interventional Cardiology

## 2013-10-18 DIAGNOSIS — F411 Generalized anxiety disorder: Secondary | ICD-10-CM | POA: Diagnosis not present

## 2013-10-18 DIAGNOSIS — Z86711 Personal history of pulmonary embolism: Secondary | ICD-10-CM | POA: Diagnosis not present

## 2013-10-18 DIAGNOSIS — M47812 Spondylosis without myelopathy or radiculopathy, cervical region: Secondary | ICD-10-CM | POA: Insufficient documentation

## 2013-10-18 DIAGNOSIS — Z96619 Presence of unspecified artificial shoulder joint: Secondary | ICD-10-CM | POA: Insufficient documentation

## 2013-10-18 DIAGNOSIS — K219 Gastro-esophageal reflux disease without esophagitis: Secondary | ICD-10-CM | POA: Diagnosis not present

## 2013-10-18 DIAGNOSIS — E039 Hypothyroidism, unspecified: Secondary | ICD-10-CM | POA: Insufficient documentation

## 2013-10-18 DIAGNOSIS — I10 Essential (primary) hypertension: Secondary | ICD-10-CM | POA: Diagnosis not present

## 2013-10-18 DIAGNOSIS — Z96659 Presence of unspecified artificial knee joint: Secondary | ICD-10-CM | POA: Insufficient documentation

## 2013-10-18 DIAGNOSIS — M1990 Unspecified osteoarthritis, unspecified site: Secondary | ICD-10-CM | POA: Insufficient documentation

## 2013-10-18 DIAGNOSIS — E785 Hyperlipidemia, unspecified: Secondary | ICD-10-CM | POA: Insufficient documentation

## 2013-10-18 DIAGNOSIS — Z7902 Long term (current) use of antithrombotics/antiplatelets: Secondary | ICD-10-CM | POA: Insufficient documentation

## 2013-10-18 DIAGNOSIS — E1149 Type 2 diabetes mellitus with other diabetic neurological complication: Secondary | ICD-10-CM | POA: Insufficient documentation

## 2013-10-18 DIAGNOSIS — D649 Anemia, unspecified: Secondary | ICD-10-CM | POA: Diagnosis not present

## 2013-10-18 DIAGNOSIS — I209 Angina pectoris, unspecified: Secondary | ICD-10-CM | POA: Diagnosis not present

## 2013-10-18 DIAGNOSIS — M47817 Spondylosis without myelopathy or radiculopathy, lumbosacral region: Secondary | ICD-10-CM | POA: Insufficient documentation

## 2013-10-18 DIAGNOSIS — E1142 Type 2 diabetes mellitus with diabetic polyneuropathy: Secondary | ICD-10-CM | POA: Insufficient documentation

## 2013-10-18 DIAGNOSIS — I2510 Atherosclerotic heart disease of native coronary artery without angina pectoris: Secondary | ICD-10-CM | POA: Insufficient documentation

## 2013-10-18 DIAGNOSIS — K589 Irritable bowel syndrome without diarrhea: Secondary | ICD-10-CM | POA: Diagnosis not present

## 2013-10-18 HISTORY — DX: Angina pectoris, unspecified: I20.9

## 2013-10-18 HISTORY — PX: LEFT HEART CATHETERIZATION WITH CORONARY ANGIOGRAM: SHX5451

## 2013-10-18 LAB — GLUCOSE, CAPILLARY: Glucose-Capillary: 139 mg/dL — ABNORMAL HIGH (ref 70–99)

## 2013-10-18 SURGERY — LEFT HEART CATHETERIZATION WITH CORONARY ANGIOGRAM
Anesthesia: LOCAL

## 2013-10-18 MED ORDER — SODIUM CHLORIDE 0.9 % IV SOLN: 1.0000 mL/kg/h | INTRAVENOUS | Status: DC

## 2013-10-18 MED ORDER — NITROGLYCERIN 1 MG/10 ML FOR IR/CATH LAB
INTRA_ARTERIAL | Status: AC
  Filled 2013-10-18: qty 10

## 2013-10-18 MED ORDER — ASPIRIN 81 MG PO CHEW
81.0000 mg | CHEWABLE_TABLET | ORAL | Status: AC
  Administered 2013-10-18: 81 mg via ORAL

## 2013-10-18 MED ORDER — FENTANYL CITRATE 0.05 MG/ML IJ SOLN
INTRAMUSCULAR | Status: AC
  Filled 2013-10-18: qty 2

## 2013-10-18 MED ORDER — ATROPINE SULFATE 0.1 MG/ML IJ SOLN
INTRAMUSCULAR | Status: AC
  Filled 2013-10-18: qty 10

## 2013-10-18 MED ORDER — LIDOCAINE HCL (PF) 1 % IJ SOLN
INTRAMUSCULAR | Status: AC
  Filled 2013-10-18: qty 30

## 2013-10-18 MED ORDER — MIDAZOLAM HCL 2 MG/2ML IJ SOLN
INTRAMUSCULAR | Status: AC
  Filled 2013-10-18: qty 2

## 2013-10-18 MED ORDER — SODIUM CHLORIDE 0.9 % IV SOLN
INTRAVENOUS | Status: DC
  Administered 2013-10-18: 08:00:00 via INTRAVENOUS

## 2013-10-18 MED ORDER — SODIUM CHLORIDE 0.9 % IV SOLN: 250.0000 mL | INTRAVENOUS | Status: DC | PRN

## 2013-10-18 MED ORDER — HEPARIN SODIUM (PORCINE) 1000 UNIT/ML IJ SOLN
INTRAMUSCULAR | Status: AC
  Filled 2013-10-18: qty 1

## 2013-10-18 MED ORDER — SODIUM CHLORIDE 0.9 % IJ SOLN: 3.0000 mL | Freq: Two times a day (BID) | INTRAMUSCULAR | Status: DC

## 2013-10-18 MED ORDER — HEPARIN (PORCINE) IN NACL 2-0.9 UNIT/ML-% IJ SOLN
INTRAMUSCULAR | Status: AC
  Filled 2013-10-18: qty 1000

## 2013-10-18 MED ORDER — VERAPAMIL HCL 2.5 MG/ML IV SOLN
INTRAVENOUS | Status: AC
  Filled 2013-10-18: qty 2

## 2013-10-18 MED ORDER — METFORMIN HCL 1000 MG PO TABS: 1000.0000 mg | ORAL_TABLET | Freq: Two times a day (BID) | ORAL | Status: DC

## 2013-10-18 MED ORDER — ASPIRIN 81 MG PO CHEW
CHEWABLE_TABLET | ORAL | Status: DC
Start: 2013-10-18 — End: 2013-10-18
  Filled 2013-10-18: qty 1

## 2013-10-18 MED ORDER — SODIUM CHLORIDE 0.9 % IJ SOLN: 3.0000 mL | INTRAMUSCULAR | Status: DC | PRN

## 2013-10-18 NOTE — Progress Notes (Signed)
Pt's reports feeling much better. Head of recliner set back up. Pt's VSS.

## 2013-10-18 NOTE — Progress Notes (Addendum)
After removing TR band, pt reports feeling like she is going to pass out. BP and HR decreased, see VS flowsheet. Pt's head reclined back and cool cloth place on forehead. Dr. Eldridge Dace notified, order received to keep pt additional hour. Will monitor.

## 2013-10-18 NOTE — H&P (View-Only) (Signed)
Patient ID: Hannah Fischer, female   DOB: 01/19/1933, 80 y.o.   MRN: [id number]     Patient ID: Hannah Fischer MRN: [id number] DOB/AGE: 01/18/1933 80 y.o.   Referring Physician Dr. Lisa Miller   Reason for Consultation chest pain  HPI:  80 y/o who has had a negative stress test in 2012.  She is diabetic for 20 years at least.  Over the past few months, she has had some chest pain, on the left side.  It can radiate to the left neck.  There can be nausea as well.  It may last  afew minutes.  She had some palpitations. It is sharp, feels like a "lightning shock."  She has not taken any medicine for this.  She is here for further evaluation.  Housework is the most strenuous activity.  THis does not bring on any sx.  It has woken her from sleep at times.    Current Outpatient Prescriptions  Medication Sig Dispense Refill  . ALPRAZolam (XANAX) 0.25 MG tablet Take 0.25 mg by mouth at bedtime as needed. For sleep/anxiety      . Cholecalciferol (VITAMIN D3) 2000 UNITS TABS Take by mouth.      . clopidogrel (PLAVIX) 75 MG tablet Take 1 tablet (75 mg total) by mouth daily with breakfast.  30 tablet  1  . Cyanocobalamin (VITAMIN B-12 PO) Take 1 tablet by mouth daily.        . gabapentin (NEURONTIN) 100 MG capsule Take 100 mg by mouth 3 (three) times daily.       . HYDROcodone-acetaminophen (VICODIN) 5-500 MG per tablet Take 1 tablet by mouth every 6 (six) hours as needed. For pain      . KRILL OIL PO Take 1 capsule by mouth daily.      . levothyroxine (SYNTHROID, LEVOTHROID) 100 MCG tablet Take 1 tablet (100 mcg total) by mouth daily.  30 tablet  1  . losartan (COZAAR) 50 MG tablet       . metFORMIN (GLUCOPHAGE) 1000 MG tablet Take 1,000 mg by mouth 2 (two) times daily with a meal. TAKES 1500 MG TWICE DAILY      . Omega-3 Fatty Acids (FISH OIL) 1000 MG CAPS Take by mouth.      . omeprazole (PRILOSEC) 20 MG capsule Take 20 mg by mouth daily.        . pioglitazone (ACTOS) 45 MG tablet Take 45 mg  by mouth daily.         No current facility-administered medications for this visit.   Past Medical History  Diagnosis Date  . Hypertension     on medication  . Seizures     once, age 7  . Complication of anesthesia     unable to breathe lying  flat on back  . Diabetes mellitus     type 2  niddm x 25 yrs  . Blood transfusion 1979    for anemia  . Anemia     chronic  . Hypothyroidism     takes levoxyl  . Arthritis     djd  . 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 Anxiety   . GERD (gastroesophageal reflux disease)   . Hyperlipemia   . Chest pain     evaluated @ [facility] cardiology; had a stress test 2012  . Diverticular disease   . Irritable bowel syndrome   . Neuromuscular disorder   . Neuropathy in diabetes   . Pulmonary   embolism 06-2010    bilateral  . DJD (degenerative joint disease)     lumbar and cervical  . Abnormal nuclear stress test     2005, normal cath Dr. Nahser, normal stress test 2012 Dr. Militza Devery    No family history on file.  History   Social History  . Marital Status: Married    Spouse Name: N/A    Number of Children: N/A  . Years of Education: N/A   Occupational History  . Not on file.   Social History Main Topics  . Smoking status: Never Smoker   . Smokeless tobacco: Never Used  . Alcohol Use: No  . Drug Use: No  . Sexual Activity: No   Other Topics Concern  . Not on file   Social History Narrative  . No narrative on file    Past Surgical History  Procedure Laterality Date  . Eye surgery  2007    cataract ext/ iol rt eye  . Blepharoplasty  2012    rt eye  . Back surgery    . Lumbar disc surgery  1990  . Joint replacement  2001,2005    both knees  . Cholecystectomy    . Shoulder hemi-arthroplasty  12/25/2010    Procedure: SHOULDER HEMI-ARTHROPLASTY;  Surgeon: Gregory Scott Dean;  Location: [facility] OR;  Service: Orthopedics;  Laterality: Right;      (Not in a hospital admission)  Review of systems complete and  found to be negative unless listed above .  No nausea, vomiting.  No fever chills, No focal weakness,  No palpitations.  Physical Exam: Filed Vitals:   10/15/13 1432  BP: 118/70  Pulse: 92    Weight: 180 lb 14.4 oz (82.056 kg)  Physical exam:  [place]/AT EOMI No JVD, No carotid bruit RRR S1S2  No wheezing Soft. NT, nondistended No edema. No focal motor or sensory deficits Normal affect  Labs:   Lab Results  Component Value Date   WBC 4.2 11/15/2011   HGB 13.0 11/15/2011   HCT 39.3 11/15/2011   MCV 95.6 11/15/2011   PLT 203 11/15/2011   No results found for this basename: NA, K, CL, CO2, BUN, CREATININE, CALCIUM, LABALBU, PROT, BILITOT, ALKPHOS, ALT, AST, GLUCOSE,  in the last 168 hours No results found for this basename: CKTOTAL, CKMB, CKMBINDEX, TROPONINI    Lab Results  Component Value Date   CHOL 207* 11/15/2011   CHOL  Value: 145        ATP III CLASSIFICATION:  <200     mg/dL   Desirable  200-239  mg/dL   Borderline High  >=240    mg/dL   High        11/04/2009   Lab Results  Component Value Date   HDL 66 11/15/2011   HDL 49 11/04/2009   Lab Results  Component Value Date   LDLCALC 110* 11/15/2011   LDLCALC  Value: 75        Total Cholesterol/HDL:CHD Risk Coronary Heart Disease Risk Table                     Men   Women  1/2 Average Risk   3.4   3.3  Average Risk       5.0   4.4  2 X Average Risk   9.6   7.1  3 X Average Risk  23.4   11.0        Use the calculated Patient Ratio above and the CHD Risk   Table to determine the patient's CHD Risk.        ATP III CLASSIFICATION (LDL):  <100     mg/dL   Optimal  469-629  mg/dL   Near or Above                    Optimal  130-159  mg/dL   Borderline  528-413  mg/dL   High  >244     mg/dL   Very High 01/0/2725   Lab Results  Component Value Date   TRIG 157* 11/15/2011   TRIG 107 11/04/2009   Lab Results  Component Value Date   CHOLHDL 3.1 11/15/2011   CHOLHDL 3.0 11/04/2009   No results found for this basename: LDLDIRECT       EKG: NSR, septal MI  ASSESSMENT AND PLAN:  Chest pain:  Some concerning featues for angina.  Abnormal ECG.   She has DM as well.  Upcoming shoulder surgery planned.  WOuld use BMS if needed.  Prior negative stress in 2012.   No bleeding issues.  Procedure explained.  Risks and benefits discussed.  Questions answered.  Signed:   Fredric Mare, MD, [facility] 10/15/2013, 3:18 PM

## 2013-10-18 NOTE — Discharge Instructions (Signed)
Radial Site Care °Refer to this sheet in the next few weeks. These instructions provide you with information on caring for yourself after your procedure. Your caregiver may also give you more specific instructions. Your treatment has been planned according to current medical practices, but problems sometimes occur. Call your caregiver if you have any problems or questions after your procedure. °HOME CARE INSTRUCTIONS °· You may shower the day after the procedure. Remove the bandage (dressing) and gently wash the site with plain soap and water. Gently pat the site dry. °· Do not apply powder or lotion to the site. °· Do not submerge the affected site in water for 3 to 5 days. °· Inspect the site at least twice daily. °· Do not flex or bend the affected arm for 24 hours. °· No lifting over 5 pounds (2.3 kg) for 5 days after your procedure. °· Do not drive home if you are discharged the same day of the procedure. Have someone else drive you. °· You may drive 24 hours after the procedure unless otherwise instructed by your caregiver. °· Do not operate machinery or power tools for 24 hours. °· A responsible adult should be with you for the first 24 hours after you arrive home. °What to expect: °· Any bruising will usually fade within 1 to 2 weeks. °· Blood that collects in the tissue (hematoma) may be painful to the touch. It should usually decrease in size and tenderness within 1 to 2 weeks. °SEEK IMMEDIATE MEDICAL CARE IF: °· You have unusual pain at the radial site. °· You have redness, warmth, swelling, or pain at the radial site. °· You have drainage (other than a small amount of blood on the dressing). °· You have chills. °· You have a fever or persistent symptoms for more than 72 hours. °· You have a fever and your symptoms suddenly get worse. °· Your arm becomes pale, cool, tingly, or numb. °· You have heavy bleeding from the site. Hold pressure on the site. °Document Released: 02/23/2010 Document Revised:  04/15/2011 Document Reviewed: 02/23/2010 °ExitCare® Patient Information ©2015 [employer]. This information is not intended to replace advice given to you by your health care provider. Make sure you discuss any questions you have with your health care provider. ° °

## 2013-10-18 NOTE — CV Procedure (Addendum)
       PROCEDURE:  Left heart catheterization with selective coronary angiography, left ventriculogram.  INDICATIONS: angina; diabetes  The risks, benefits, and details of the procedure were explained to the patient.  The patient verbalized understanding and wanted to proceed.  Informed written consent was obtained.  PROCEDURE TECHNIQUE:  After Xylocaine anesthesia a 75F slender sheath was placed in the right radial artery with a single anterior needle wall stick.   Right coronary angiography was done using a williams right guide catheter.  Left coronary angiography was done using a Judkins L3.5 guide catheter.  Left ventriculography was done using a pigtail catheter.  A TR band was used for hemostasis.   CONTRAST:  Total of 75 cc.  COMPLICATIONS:  None.    HEMODYNAMICS:  Aortic pressure was 149/68; LV pressure was 150/4; LVEDP 7.  There was no gradient between the left ventricle and aorta.    ANGIOGRAPHIC DATA:   The left main coronary artery is widely patent.  The left anterior descending artery is a large vessel which reaches the apex. There is a large first diagonal. In the LAD, after the first diagonal, there is an area of moderate, tubular disease. The diagonal is widely patent. The apical LAD is widely patent.  The left circumflex artery is a large vessel. In the mid vessel, there is mild atherosclerosis. There is a medium size ramus vessel which is patent. There are several small obtuse marginals. The majority of the circumflex ends in a large OM 3 which is patent. There is no significant obstructive disease of the circumflex system.  The right coronary artery is a large, dominant vessel. There is mild atherosclerosis in the mid vessel. The posterior lateral artery is large. The posterior descending artery is large. There is no significant obstructive disease in the RCA system.  LEFT VENTRICULOGRAM:  Left ventricular angiogram was done in the 30 RAO projection and revealed normal  left ventricular wall motion and systolic function with an estimated ejection fraction of 55%.  LVEDP was 7 mmHg.  IMPRESSIONS:  1. Normal left main coronary artery. 2. Mild, nonobstructive disease in the left anterior descending artery and its branches. 3. Mild, nonobstructive disease in the left circumflex artery and its branches. 4. Mild, nonobstructive disease in the right coronary artery. 5. Normal left ventricular systolic function.  LVEDP 7 mmHg.  Ejection fraction 55%.  6.  of note, there is significant tortuosity of the right shoulder. This makes catheter torquing somewhat difficult. A JL 3.5 fits easily.  It is difficult to selectively engage the RCA.  RECOMMENDATION:  Continue medical therapy. No further cardiac testing needed prior to shoulder surgery. Okay to hold Plavix 5 days prior to surgery.

## 2013-10-18 NOTE — Interval H&P Note (Signed)
Cath Lab Visit (complete for each Cath Lab visit)  Clinical Evaluation Leading to the Procedure:   ACS: No.  Non-ACS:    Anginal Classification: CCS III  Anti-ischemic medical therapy: No Therapy  Non-Invasive Test Results: No non-invasive testing performed  Prior CABG: No previous CABG      History and Physical Interval Note:  10/18/2013 9:43 AM  Hannah Fischer  has presented today for surgery, with the diagnosis of Angina  The various methods of treatment have been discussed with the patient and family. After consideration of risks, benefits and other options for treatment, the patient has consented to  Procedure(s): LEFT HEART CATHETERIZATION WITH CORONARY ANGIOGRAM (N/A) as a surgical intervention .  The patient's history has been reviewed, patient examined, no change in status, stable for surgery.  I have reviewed the patient's chart and labs.  Questions were answered to the patient's satisfaction.     VARANASI,JAYADEEP S.

## 2013-11-19 NOTE — Pre-Procedure Instructions (Signed)
Hannah Fischer G Alperin  11/19/2013   Your procedure is scheduled on:  Friday, Oct. 30th   [facility]  [facility]:30 AM.   Call this number if you have problems the morning of surgery: [phone]   Remember:   Do not eat food or drink liquids after midnight Thursday.    Take these medicines the morning of surgery with A SIP OF WATER: Xanax, Gabapentin, Hydrocodone, Levothyroxine, Omeprazole   Do not wear jewelry, make-up or nail polish.  Do not wear lotions, powders, or perfumes. You may NOT wear deodorant the day of surgery.  Do not shave underarms & legs 48 hours prior to surgery.    Do not bring valuables to the hospital.  [facility] is not responsible for any belongings or valuables.               Contacts, dentures or bridgework may not be worn into surgery.  Leave suitcase in the car. After surgery it may be brought to your room.  For patients admitted to the hospital, discharge time is determined by your treatment team.               Name and phone number of your driver:    Special Instructions: "Preparing for Surgery" instruction sheet.   Please read over the following fact sheets that you were given: Pain Booklet, Coughing and Deep Breathing, MRSA Information and Surgical Site Infection Prevention

## 2013-11-22 ENCOUNTER — Encounter (HOSPITAL_COMMUNITY): Payer: Self-pay

## 2013-11-22 ENCOUNTER — Encounter (HOSPITAL_COMMUNITY)
Admission: RE | Admit: 2013-11-22 | Discharge: 2013-11-22 | Disposition: A | Discharge status: Home / Self Care | Payer: Medicare Other | Source: Ambulatory Visit | Attending: Orthopedic Surgery | Admitting: Orthopedic Surgery

## 2013-11-22 ENCOUNTER — Encounter (HOSPITAL_COMMUNITY): Payer: Self-pay | Admitting: Pharmacy Technician

## 2013-11-22 DIAGNOSIS — K219 Gastro-esophageal reflux disease without esophagitis: Secondary | ICD-10-CM | POA: Diagnosis not present

## 2013-11-22 DIAGNOSIS — I10 Essential (primary) hypertension: Secondary | ICD-10-CM | POA: Diagnosis not present

## 2013-11-22 DIAGNOSIS — M19012 Primary osteoarthritis, left shoulder: Secondary | ICD-10-CM | POA: Insufficient documentation

## 2013-11-22 DIAGNOSIS — E785 Hyperlipidemia, unspecified: Secondary | ICD-10-CM | POA: Insufficient documentation

## 2013-11-22 DIAGNOSIS — E669 Obesity, unspecified: Secondary | ICD-10-CM | POA: Insufficient documentation

## 2013-11-22 DIAGNOSIS — E119 Type 2 diabetes mellitus without complications: Secondary | ICD-10-CM | POA: Diagnosis not present

## 2013-11-22 DIAGNOSIS — Z01818 Encounter for other preprocedural examination: Secondary | ICD-10-CM | POA: Diagnosis present

## 2013-11-22 DIAGNOSIS — Z87891 Personal history of nicotine dependence: Secondary | ICD-10-CM | POA: Diagnosis not present

## 2013-11-22 LAB — COMPREHENSIVE METABOLIC PANEL
ALT: 11 U/L (ref 0–35)
AST: 17 U/L (ref 0–37)
Albumin: 3.3 g/dL — ABNORMAL LOW (ref 3.5–5.2)
Alkaline Phosphatase: 93 U/L (ref 39–117)
Anion gap: 15 (ref 5–15)
BUN: 21 mg/dL (ref 6–23)
CO2: 23 mEq/L (ref 19–32)
Calcium: 9.2 mg/dL (ref 8.4–10.5)
Chloride: 105 mEq/L (ref 96–112)
Creatinine, Ser: 0.76 mg/dL (ref 0.50–1.10)
GFR calc Af Amer: >90 mL/min (ref >90)
GFR calc non Af Amer: 79 mL/min — ABNORMAL LOW (ref >90)
Glucose, Bld: 228 mg/dL — ABNORMAL HIGH (ref 70–99)
Potassium: 3.9 mEq/L (ref 3.7–5.3)
Sodium: 143 mEq/L (ref 137–147)
Total Bilirubin: 0.3 mg/dL (ref 0.3–1.2)
Total Protein: 6.7 g/dL (ref 6.0–8.3)

## 2013-11-22 LAB — DIFFERENTIAL
Basophils Absolute: 0 10*3/uL (ref 0.0–0.1)
Basophils Relative: 1 % (ref 0–1)
Eosinophils Absolute: 0.1 10*3/uL (ref 0.0–0.7)
Eosinophils Relative: 3 % (ref 0–5)
Lymphocytes Relative: 28 % (ref 12–46)
Lymphs Abs: 1.1 10*3/uL (ref 0.7–4.0)
Monocytes Absolute: 0.2 10*3/uL (ref 0.1–1.0)
Monocytes Relative: 6 % (ref 3–12)
Neutro Abs: 2.4 10*3/uL (ref 1.7–7.7)
Neutrophils Relative %: 62 % (ref 43–77)

## 2013-11-22 LAB — URINALYSIS, ROUTINE W REFLEX MICROSCOPIC
Bilirubin Urine: NEGATIVE
Glucose, UA: >1000 mg/dL — AB
Hgb urine dipstick: NEGATIVE
Ketones, ur: NEGATIVE mg/dL
Leukocytes, UA: NEGATIVE
Nitrite: NEGATIVE
Protein, ur: NEGATIVE mg/dL
Specific Gravity, Urine: 1.03 (ref 1.005–1.030)
Urobilinogen, UA: 0.2 mg/dL (ref 0.0–1.0)
pH: 5 (ref 5.0–8.0)

## 2013-11-22 LAB — URINE MICROSCOPIC-ADD ON

## 2013-11-22 LAB — CBC
HCT: 36.9 % (ref 36.0–46.0)
Hemoglobin: 11.8 g/dL — ABNORMAL LOW (ref 12.0–15.0)
MCH: 30.7 pg (ref 26.0–34.0)
MCHC: 32 g/dL (ref 30.0–36.0)
MCV: 96.1 fL (ref 78.0–100.0)
Platelets: 190 10*3/uL (ref 150–400)
RBC: 3.84 MIL/uL — ABNORMAL LOW (ref 3.87–5.11)
RDW: 14 % (ref 11.5–15.5)
WBC: 3.8 10*3/uL — ABNORMAL LOW (ref 4.0–10.5)

## 2013-11-22 LAB — SURGICAL PCR SCREEN
MRSA, PCR: NEGATIVE
Staphylococcus aureus: NEGATIVE

## 2013-11-22 NOTE — Progress Notes (Signed)
11/22/13 0914  OBSTRUCTIVE SLEEP APNEA  Have you ever been diagnosed with sleep apnea through a sleep study? No  Do you snore loudly (loud enough to be heard through closed doors)?  0  Do you often feel tired, fatigued, or sleepy during the daytime? 1 ("old lazy, i guess")  Has anyone observed you stop breathing during your sleep? 0  Do you have, or are you being treated for high blood pressure? 1  BMI more than 35 kg/m2? 1  Age over 78 years old? 1  Neck circumference greater than 40 cm/16 inches? 0  Gender: 0  Obstructive Sleep Apnea Score 4  Score 4 or greater  Results sent to PCP

## 2013-11-22 NOTE — Progress Notes (Addendum)
No orders @ PAT appt.  Permit to be signed then.  Last visit to cardio was in Sept. 11, 2015 ([place]) She stated she had some 'chest discomfort' last night, but all the tests done "say" its NOT heart related.   No CXR done today, as she has just had CT with contrast back in April. (neg)  Last PE was in 2006 and she has been taking Plavix and knows to stop it 4-5 days out.  DA

## 2013-11-23 NOTE — Progress Notes (Signed)
Anesthesia Chart Review:  Patient is a [age] year old female scheduled for left shoulder reverse arthroplasty on 12/03/13 by Dr. Ranell PatrickNorris.  History includes non-smoker, HTN, DM2 with neuropathy, anemia, hypothyroidism, arthritis, anxiety, chest pain (mild non-obstructive CAD by 10/2013 cath), PE '12, IBS, HLD, childhood seizure, GERD, right kidney agenesis, right shoulder hemi-arthroplasty '12. BMI is consistent with obesity. PCP is Dr. Sigmund HazelLisa Miller. Cardiologist is Dr. Griffin DakinVaranai who cleared her for surgery following a cardiac cath last month (see below).  EKG on 10/18/13 showed: NSR with sinus arrhythmia, LAD, anteroseptal infarct (age undetermined).   Cardiac cath on 10/18/13 showed:  1. Normal left main coronary artery. 2. Mild, nonobstructive disease in the left anterior descending artery and its branches. 3. Mild, nonobstructive disease in the left circumflex artery and its branches. 4. Mild, nonobstructive disease in the right coronary artery. 5. Normal left ventricular systolic function. LVEDP 7 mmHg. Ejection fraction 55%. 6. Of note, there is significant tortuosity of the right shoulder. This makes catheter torquing somewhat difficult. A JL 3.5 fits easily. It is difficult to selectively engage the RCA.  RECOMMENDATION: Continue medical therapy. No further cardiac testing needed prior to shoulder surgery. Okay to hold Plavix 5 days prior to surgery. (Dr. Eldridge DaceVaranasi)   Echo 11/16/11: - Left ventricle: The cavity size was normal. There was mild concentric hypertrophy. Systolic function was normal. The estimated ejection fraction was in the range of 55% to 60%. There is akinesis of the basalinferior myocardium. Doppler parameters are consistent with abnormal left ventricular relaxation (grade 1 diastolic dysfunction). - Mitral valve: Calcified annulus. Mildly thickened, mildly calcified leaflets . Mild regurgitation. - Pulmonary arteries: PA peak pressure: 33mm Hg (S).  10/25/11 carotid duplex: < 50%  bilateral ICA stenosis.  CTA of the chest on 08/27/13 showed: No pneumothorax or pleural effusion is noted. No acute pulmonary disease is noted. No mediastinal mass or adenopathy is noted. No significant abnormality seen in visualized portion of upper abdomen. There is no evidence of pulmonary embolus. Atherosclerotic calcifications of thoracic aorta are noted without aneurysm or dissection. Great vessels appear to be widely patent without significant stenosis. Status post right shoulder arthroplasty.   Preoperative labs noted.  She will get a fasting CBG on arrival.    She has been cleared by her cardiologist.  Since she had a recent chest CTA, I didn't think she would need a preoperative CXR.  If one is desired by her anesthesiologist, then it will need to be done on the day of surgery.  Hannah Ochsllison Angely Dietz, PA-C [facility]Anesthesiology Phone [phone] 11/23/2013 4:10 PM

## 2013-11-24 NOTE — H&P (Signed)
Hannah Fischer is an 80 y.o. female.    Chief Complaint: left shoulder pain  HPI: Pt is a 80 y.o. female complaining of left shoulder pain for multiple years. Pain had continually increased since the beginning. X-rays in the clinic show end-stage arthritic changes of the left shoulder. Pt has tried various conservative treatments which have failed to alleviate their symptoms, including injections and therapy. Various options are discussed with the patient. Risks, benefits and expectations were discussed with the patient. Patient understand the risks, benefits and expectations and wishes to proceed with surgery.   PCP:  Neldon LabellaMILLER,LISA LYNN, MD  D/C Plans:  Home with HHPT  PMH: Past Medical History  Diagnosis Date  . Hypertension     on medication  . Seizures     once, age 89  . Complication of anesthesia     unable to breathe lying  flat on back  . Diabetes mellitus     type 2  niddm x 25 yrs  . Blood transfusion 1979    for anemia  . Anemia     chronic  . Hypothyroidism     takes levoxyl  . Arthritis     djd  . 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 Anxiety   . GERD (gastroesophageal reflux disease)   . Hyperlipemia   . Chest pain     evaluated @ [facility]; had a stress test 2012  . Diverticular disease   . Irritable bowel syndrome   . Neuromuscular disorder   . Neuropathy in diabetes   . Pulmonary embolism 06-2010    bilateral  . DJD (degenerative joint disease)     lumbar and cervical  . Abnormal nuclear stress test     2005, normal cath Dr. Elease HashimotoNahser, normal stress test 2012 Dr. Eldridge DaceVaranasi  . Other and unspecified angina pectoris     tests came back NOT heart related    PSH: Past Surgical History  Procedure Laterality Date  . Eye surgery  2007    cataract ext/ iol rt eye  . Blepharoplasty  2012    rt eye  . Back surgery    . Lumbar disc surgery  1990  . Joint replacement  2001,2005    both knees  . Cholecystectomy    . Shoulder  hemi-arthroplasty  12/25/2010    Procedure: SHOULDER HEMI-ARTHROPLASTY;  Surgeon: Cammy CopaGregory Scott Dean;  [facility];  Service: Orthopedics;  Laterality: Right;  . Cardiac catheterization      had one 10/2013    Social History:  reports that she has never smoked. She has never used smokeless tobacco. She reports that she does not drink alcohol or use illicit drugs.  Allergies:  Allergies  Allergen Reactions  . Sulfa Antibiotics Other (See Comments)    Gastric intolerance  . Sulfites Other (See Comments)    Abdominal pain  . Codeine Anxiety    Medications: No current facility-administered medications for this encounter.   Current Outpatient Prescriptions  Medication Sig Dispense Refill  . ALPRAZolam (XANAX) 0.25 MG tablet Take 0.25 mg by mouth at bedtime as needed for anxiety or sleep.       . Cholecalciferol (VITAMIN D3) 2000 UNITS TABS Take 2,000 Units by mouth daily.       . clopidogrel (PLAVIX) 75 MG tablet Take 1 tablet (75 mg total) by mouth daily with breakfast.  30 tablet  1  . Cyanocobalamin (VITAMIN B-12 PO) Take 2 tablets by mouth daily.       .Marland Kitchen  gabapentin (NEURONTIN) 100 MG capsule Take 100 mg by mouth 3 (three) times daily.       Marland Kitchen. HYDROcodone-acetaminophen (NORCO/VICODIN) 5-325 MG per tablet Take 1 tablet by mouth every 6 (six) hours as needed for moderate pain.      Marland Kitchen. levothyroxine (SYNTHROID, LEVOTHROID) 100 MCG tablet Take 1 tablet (100 mcg total) by mouth daily.  30 tablet  1  . losartan (COZAAR) 50 MG tablet Take 50 mg by mouth daily.       . metFORMIN (GLUCOPHAGE) 1000 MG tablet Take 1,000 mg by mouth 2 (two) times daily with a meal.      . Omega-3 Fatty Acids (FISH OIL) 1000 MG CAPS Take 2 capsules by mouth 2 (two) times daily.       Marland Kitchen. omeprazole (PRILOSEC) 20 MG capsule Take 20 mg by mouth daily.        . pioglitazone (ACTOS) 45 MG tablet Take 45 mg by mouth daily.          No results found for this or any previous visit (from the past 48 hour(s)). No  results found.  ROS: Pain with rom of the left upper extremity  Physical Exam:  Alert and oriented 80 y.o. female in no acute distress Cranial nerves 2-12 intact Cervical spine: full rom with no tenderness, nv intact distally Chest: active breath sounds bilaterally, no wheeze rhonchi or rales Heart: regular rate and rhythm, no murmur Abd: non tender non distended with active bowel sounds Hip is stable with rom  Left shoulder with limited rom and strength with ER and Ir nv intact distally No rashes or edema  Assessment/Plan Assessment: left shoulder rotator cuff insufficiency  Plan: Patient will undergo a left reverse total shoulder by [facility]. [facility]. Risks benefits and expectations were discussed with the patient. Patient understand risks, benefits and expectations and wishes to proceed.

## 2013-12-02 MED ORDER — CEFAZOLIN SODIUM-DEXTROSE 2-3 GM-% IV SOLR
2.0000 g | INTRAVENOUS | Status: AC
  Administered 2013-12-03: 2 g via INTRAVENOUS
  Filled 2013-12-02: qty 50

## 2013-12-02 MED ORDER — CHLORHEXIDINE GLUCONATE 4 % EX LIQD
60.0000 mL | Freq: Once | CUTANEOUS | Status: DC
  Filled 2013-12-02: qty 60

## 2013-12-03 ENCOUNTER — Inpatient Hospital Stay (HOSPITAL_COMMUNITY): Payer: Medicare Other

## 2013-12-03 ENCOUNTER — Encounter (HOSPITAL_COMMUNITY): Payer: Medicare Other | Admitting: Vascular Surgery

## 2013-12-03 ENCOUNTER — Encounter (HOSPITAL_COMMUNITY)
Admission: RE | Disposition: A | Discharge status: Home / Self Care | Payer: Self-pay | Source: Ambulatory Visit | Attending: Orthopedic Surgery

## 2013-12-03 ENCOUNTER — Inpatient Hospital Stay (HOSPITAL_COMMUNITY): Payer: Medicare Other | Admitting: Anesthesiology

## 2013-12-03 ENCOUNTER — Inpatient Hospital Stay (HOSPITAL_COMMUNITY)
Admission: RE | Admit: 2013-12-03 | Discharge: 2013-12-04 | DRG: 483 | Disposition: A | Discharge status: Home / Self Care | Payer: Medicare Other | Source: Ambulatory Visit | Attending: Orthopedic Surgery | Admitting: Orthopedic Surgery

## 2013-12-03 ENCOUNTER — Encounter (HOSPITAL_COMMUNITY): Payer: Self-pay

## 2013-12-03 DIAGNOSIS — I2510 Atherosclerotic heart disease of native coronary artery without angina pectoris: Secondary | ICD-10-CM | POA: Diagnosis present

## 2013-12-03 DIAGNOSIS — Z79899 Other long term (current) drug therapy: Secondary | ICD-10-CM | POA: Diagnosis not present

## 2013-12-03 DIAGNOSIS — M19012 Primary osteoarthritis, left shoulder: Principal | ICD-10-CM | POA: Diagnosis present

## 2013-12-03 DIAGNOSIS — E039 Hypothyroidism, unspecified: Secondary | ICD-10-CM | POA: Diagnosis present

## 2013-12-03 DIAGNOSIS — Z881 Allergy status to other antibiotic agents status: Secondary | ICD-10-CM

## 2013-12-03 DIAGNOSIS — Q600 Renal agenesis, unilateral: Secondary | ICD-10-CM | POA: Diagnosis not present

## 2013-12-03 DIAGNOSIS — Z96653 Presence of artificial knee joint, bilateral: Secondary | ICD-10-CM | POA: Diagnosis present

## 2013-12-03 DIAGNOSIS — Z885 Allergy status to narcotic agent status: Secondary | ICD-10-CM | POA: Diagnosis not present

## 2013-12-03 DIAGNOSIS — Z888 Allergy status to other drugs, medicaments and biological substances status: Secondary | ICD-10-CM | POA: Diagnosis not present

## 2013-12-03 DIAGNOSIS — Z86711 Personal history of pulmonary embolism: Secondary | ICD-10-CM | POA: Diagnosis not present

## 2013-12-03 DIAGNOSIS — Z8673 Personal history of transient ischemic attack (TIA), and cerebral infarction without residual deficits: Secondary | ICD-10-CM | POA: Diagnosis not present

## 2013-12-03 DIAGNOSIS — Z7902 Long term (current) use of antithrombotics/antiplatelets: Secondary | ICD-10-CM | POA: Diagnosis not present

## 2013-12-03 DIAGNOSIS — Z96611 Presence of right artificial shoulder joint: Secondary | ICD-10-CM | POA: Diagnosis present

## 2013-12-03 DIAGNOSIS — K219 Gastro-esophageal reflux disease without esophagitis: Secondary | ICD-10-CM | POA: Diagnosis present

## 2013-12-03 DIAGNOSIS — I10 Essential (primary) hypertension: Secondary | ICD-10-CM | POA: Diagnosis present

## 2013-12-03 DIAGNOSIS — Z96612 Presence of left artificial shoulder joint: Secondary | ICD-10-CM

## 2013-12-03 DIAGNOSIS — F419 Anxiety disorder, unspecified: Secondary | ICD-10-CM | POA: Diagnosis present

## 2013-12-03 DIAGNOSIS — E785 Hyperlipidemia, unspecified: Secondary | ICD-10-CM | POA: Diagnosis present

## 2013-12-03 DIAGNOSIS — E1140 Type 2 diabetes mellitus with diabetic neuropathy, unspecified: Secondary | ICD-10-CM | POA: Diagnosis present

## 2013-12-03 DIAGNOSIS — D649 Anemia, unspecified: Secondary | ICD-10-CM | POA: Diagnosis present

## 2013-12-03 HISTORY — PX: REVERSE SHOULDER ARTHROPLASTY: SHX5054

## 2013-12-03 HISTORY — DX: Primary osteoarthritis, left shoulder: M19.012

## 2013-12-03 LAB — GLUCOSE, CAPILLARY
Glucose-Capillary: 181 mg/dL — ABNORMAL HIGH (ref 70–99)
Glucose-Capillary: 202 mg/dL — ABNORMAL HIGH (ref 70–99)
Glucose-Capillary: 176 mg/dL — ABNORMAL HIGH (ref 70–99)

## 2013-12-03 SURGERY — ARTHROPLASTY, SHOULDER, TOTAL, REVERSE
Anesthesia: General | Site: Shoulder | Laterality: Left

## 2013-12-03 MED ORDER — BUPIVACAINE-EPINEPHRINE (PF) 0.25% -1:200000 IJ SOLN
INTRAMUSCULAR | Status: AC
  Filled 2013-12-03: qty 30

## 2013-12-03 MED ORDER — GABAPENTIN 100 MG PO CAPS
100.0000 mg | ORAL_CAPSULE | Freq: Three times a day (TID) | ORAL | Status: DC
  Administered 2013-12-03 – 2013-12-04 (×3): 100 mg via ORAL
  Filled 2013-12-03 (×5): qty 1

## 2013-12-03 MED ORDER — INSULIN ASPART 100 UNIT/ML ~~LOC~~ SOLN: 4.0000 [IU] | Freq: Three times a day (TID) | SUBCUTANEOUS | Status: DC

## 2013-12-03 MED ORDER — VITAMIN D3 25 MCG (1000 UNIT) PO TABS
2000.0000 [IU] | ORAL_TABLET | Freq: Every day | ORAL | Status: DC
  Administered 2013-12-04: 2000 [IU] via ORAL
  Filled 2013-12-03: qty 2

## 2013-12-03 MED ORDER — BUPIVACAINE-EPINEPHRINE 0.25% -1:200000 IJ SOLN
INTRAMUSCULAR | Status: DC | PRN
Start: 2013-12-03 — End: 2013-12-03
  Administered 2013-12-03: 8 mL

## 2013-12-03 MED ORDER — FENTANYL CITRATE 0.05 MG/ML IJ SOLN: 25.0000 ug | INTRAMUSCULAR | Status: DC | PRN

## 2013-12-03 MED ORDER — LEVOTHYROXINE SODIUM 100 MCG PO TABS
100.0000 ug | ORAL_TABLET | Freq: Every day | ORAL | Status: DC
  Administered 2013-12-04: 100 ug via ORAL
  Filled 2013-12-03 (×2): qty 1

## 2013-12-03 MED ORDER — 0.9 % SODIUM CHLORIDE (POUR BTL) OPTIME
TOPICAL | Status: DC | PRN
  Administered 2013-12-03: 1000 mL

## 2013-12-03 MED ORDER — OMEGA-3-ACID ETHYL ESTERS 1 G PO CAPS
2.0000 g | ORAL_CAPSULE | Freq: Every day | ORAL | Status: DC
  Administered 2013-12-04: 2 g via ORAL
  Filled 2013-12-03: qty 2

## 2013-12-03 MED ORDER — PROPOFOL 10 MG/ML IV BOLUS
INTRAVENOUS | Status: AC
  Filled 2013-12-03: qty 20

## 2013-12-03 MED ORDER — CLOPIDOGREL BISULFATE 75 MG PO TABS
75.0000 mg | ORAL_TABLET | Freq: Every day | ORAL | Status: DC
  Administered 2013-12-04: 75 mg via ORAL
  Filled 2013-12-03 (×2): qty 1

## 2013-12-03 MED ORDER — INSULIN ASPART 100 UNIT/ML ~~LOC~~ SOLN: 0.0000 [IU] | Freq: Every day | SUBCUTANEOUS | Status: DC

## 2013-12-03 MED ORDER — ALPRAZOLAM 0.25 MG PO TABS: 0.2500 mg | ORAL_TABLET | Freq: Every evening | ORAL | Status: DC | PRN

## 2013-12-03 MED ORDER — HYDROMORPHONE HCL 1 MG/ML IJ SOLN
0.5000 mg | INTRAMUSCULAR | Status: DC | PRN
  Administered 2013-12-04 (×2): 1 mg via INTRAVENOUS
  Filled 2013-12-03 (×2): qty 1

## 2013-12-03 MED ORDER — SUCCINYLCHOLINE CHLORIDE 20 MG/ML IJ SOLN
INTRAMUSCULAR | Status: DC | PRN
  Administered 2013-12-03: 100 mg via INTRAVENOUS

## 2013-12-03 MED ORDER — LACTATED RINGERS IV SOLN
INTRAVENOUS | Status: DC
  Administered 2013-12-03: 09:00:00 via INTRAVENOUS
  Administered 2013-12-03: 1000 mL via INTRAVENOUS

## 2013-12-03 MED ORDER — FENTANYL CITRATE 0.05 MG/ML IJ SOLN
INTRAMUSCULAR | Status: AC
  Administered 2013-12-03: 50 ug
  Filled 2013-12-03: qty 2

## 2013-12-03 MED ORDER — HYDROCODONE-ACETAMINOPHEN 5-325 MG PO TABS
1.0000 | ORAL_TABLET | Freq: Four times a day (QID) | ORAL | Status: DC | PRN
  Administered 2013-12-03 – 2013-12-04 (×2): 1 via ORAL
  Filled 2013-12-03 (×2): qty 1

## 2013-12-03 MED ORDER — FENTANYL CITRATE 0.05 MG/ML IJ SOLN
INTRAMUSCULAR | Status: AC
  Filled 2013-12-03: qty 5

## 2013-12-03 MED ORDER — ACETAMINOPHEN 325 MG PO TABS: 650.0000 mg | ORAL_TABLET | Freq: Four times a day (QID) | ORAL | Status: DC | PRN

## 2013-12-03 MED ORDER — PANTOPRAZOLE SODIUM 40 MG PO TBEC
80.0000 mg | DELAYED_RELEASE_TABLET | Freq: Every day | ORAL | Status: DC
  Administered 2013-12-04: 80 mg via ORAL

## 2013-12-03 MED ORDER — PHENOL 1.4 % MT LIQD
1.0000 | OROMUCOSAL | Status: DC | PRN
  Administered 2013-12-03: 1 via OROMUCOSAL
  Filled 2013-12-03: qty 177

## 2013-12-03 MED ORDER — PROPOFOL 10 MG/ML IV BOLUS
INTRAVENOUS | Status: DC | PRN
  Administered 2013-12-03: 70 mg via INTRAVENOUS

## 2013-12-03 MED ORDER — BUPIVACAINE-EPINEPHRINE (PF) 0.5% -1:200000 IJ SOLN
INTRAMUSCULAR | Status: DC | PRN
  Administered 2013-12-03: 25 mL via PERINEURAL

## 2013-12-03 MED ORDER — MENTHOL 3 MG MT LOZG: 1.0000 | LOZENGE | OROMUCOSAL | Status: DC | PRN

## 2013-12-03 MED ORDER — ONDANSETRON HCL 4 MG PO TABS: 4.0000 mg | ORAL_TABLET | Freq: Four times a day (QID) | ORAL | Status: DC | PRN

## 2013-12-03 MED ORDER — EPHEDRINE SULFATE 50 MG/ML IJ SOLN
INTRAMUSCULAR | Status: DC | PRN
  Administered 2013-12-03 (×3): 5 mg via INTRAVENOUS
  Administered 2013-12-03: 15 mg via INTRAVENOUS
  Administered 2013-12-03 (×3): 5 mg via INTRAVENOUS

## 2013-12-03 MED ORDER — VITAMIN D3 50 MCG (2000 UT) PO TABS: 2000.0000 [IU] | ORAL_TABLET | Freq: Every day | ORAL | Status: DC

## 2013-12-03 MED ORDER — LOSARTAN POTASSIUM 50 MG PO TABS
50.0000 mg | ORAL_TABLET | Freq: Every day | ORAL | Status: DC
  Administered 2013-12-03 – 2013-12-04 (×2): 50 mg via ORAL
  Filled 2013-12-03 (×2): qty 1

## 2013-12-03 MED ORDER — ONDANSETRON HCL 4 MG/2ML IJ SOLN
4.0000 mg | Freq: Once | INTRAMUSCULAR | Status: DC | PRN
Start: 2013-12-03 — End: 2013-12-03

## 2013-12-03 MED ORDER — MIDAZOLAM HCL 2 MG/2ML IJ SOLN
INTRAMUSCULAR | Status: AC
  Filled 2013-12-03: qty 2

## 2013-12-03 MED ORDER — HYDROCODONE-ACETAMINOPHEN 5-325 MG PO TABS: 1.0000 | ORAL_TABLET | Freq: Four times a day (QID) | ORAL | Status: DC | PRN

## 2013-12-03 MED ORDER — ONDANSETRON HCL 4 MG/2ML IJ SOLN
INTRAMUSCULAR | Status: DC | PRN
  Administered 2013-12-03: 4 mg via INTRAVENOUS

## 2013-12-03 MED ORDER — METOCLOPRAMIDE HCL 10 MG PO TABS: 5.0000 mg | ORAL_TABLET | Freq: Three times a day (TID) | ORAL | Status: DC | PRN

## 2013-12-03 MED ORDER — PIOGLITAZONE HCL 45 MG PO TABS
45.0000 mg | ORAL_TABLET | Freq: Every day | ORAL | Status: DC
  Administered 2013-12-03 – 2013-12-04 (×2): 45 mg via ORAL
  Filled 2013-12-03 (×3): qty 1

## 2013-12-03 MED ORDER — INSULIN ASPART 100 UNIT/ML ~~LOC~~ SOLN: 0.0000 [IU] | Freq: Three times a day (TID) | SUBCUTANEOUS | Status: DC

## 2013-12-03 MED ORDER — METFORMIN HCL 500 MG PO TABS
1000.0000 mg | ORAL_TABLET | Freq: Two times a day (BID) | ORAL | Status: DC
  Administered 2013-12-03 – 2013-12-04 (×2): 1000 mg via ORAL
  Filled 2013-12-03 (×4): qty 2

## 2013-12-03 MED ORDER — FENTANYL CITRATE 0.05 MG/ML IJ SOLN
INTRAMUSCULAR | Status: DC | PRN
  Administered 2013-12-03 (×5): 50 ug via INTRAVENOUS

## 2013-12-03 MED ORDER — ONDANSETRON HCL 4 MG/2ML IJ SOLN: 4.0000 mg | Freq: Four times a day (QID) | INTRAMUSCULAR | Status: DC | PRN

## 2013-12-03 MED ORDER — HYDROCODONE-ACETAMINOPHEN 5-325 MG PO TABS: 1.0000 | ORAL_TABLET | ORAL | Status: DC | PRN

## 2013-12-03 MED ORDER — CEFAZOLIN SODIUM-DEXTROSE 2-3 GM-% IV SOLR
2.0000 g | Freq: Four times a day (QID) | INTRAVENOUS | Status: AC
  Administered 2013-12-03 – 2013-12-04 (×2): 2 g via INTRAVENOUS
  Filled 2013-12-03 (×3): qty 50

## 2013-12-03 MED ORDER — SODIUM CHLORIDE 0.9 % IV SOLN
INTRAVENOUS | Status: DC
  Administered 2013-12-03 – 2013-12-04 (×2): via INTRAVENOUS

## 2013-12-03 MED ORDER — METOCLOPRAMIDE HCL 5 MG/ML IJ SOLN: 5.0000 mg | Freq: Three times a day (TID) | INTRAMUSCULAR | Status: DC | PRN

## 2013-12-03 MED ORDER — LACTATED RINGERS IV SOLN
INTRAVENOUS | Status: DC | PRN
  Administered 2013-12-03 (×2): via INTRAVENOUS

## 2013-12-03 MED ORDER — LIDOCAINE HCL (CARDIAC) 20 MG/ML IV SOLN
INTRAVENOUS | Status: DC | PRN
  Administered 2013-12-03: 60 mg via INTRAVENOUS

## 2013-12-03 MED ORDER — ACETAMINOPHEN 650 MG RE SUPP: 650.0000 mg | Freq: Four times a day (QID) | RECTAL | Status: DC | PRN

## 2013-12-03 SURGICAL SUPPLY — 61 items
BIT DRILL 170X2.5X (BIT) IMPLANT
BIT DRL 170X2.5X (BIT)
BLADE SAG 18X100X1.27 (BLADE) ×2 IMPLANT
CAPT SHOULD DELTAXTEND CEM MOD ×2 IMPLANT
CLSR STERI-STRIP ANTIMIC 1/2X4 (GAUZE/BANDAGES/DRESSINGS) ×2 IMPLANT
COVER SURGICAL LIGHT HANDLE (MISCELLANEOUS) ×2 IMPLANT
DRAPE INCISE IOBAN 66X45 STRL (DRAPES) ×6 IMPLANT
DRAPE U-SHAPE 47X51 STRL (DRAPES) ×2 IMPLANT
DRILL 2.5 (BIT)
DRSG ADAPTIC 3X8 NADH LF (GAUZE/BANDAGES/DRESSINGS) ×2 IMPLANT
DRSG PAD ABDOMINAL 8X10 ST (GAUZE/BANDAGES/DRESSINGS) ×2 IMPLANT
DURAPREP 26ML APPLICATOR (WOUND CARE) ×2 IMPLANT
ELECT BLADE 4.0 EZ CLEAN MEGAD (MISCELLANEOUS) ×2
ELECT NEEDLE TIP 2.8 STRL (NEEDLE) ×2 IMPLANT
ELECT REM PT RETURN 9FT ADLT (ELECTROSURGICAL) ×2
ELECTRODE BLDE 4.0 EZ CLN MEGD (MISCELLANEOUS) ×1 IMPLANT
ELECTRODE REM PT RTRN 9FT ADLT (ELECTROSURGICAL) ×1 IMPLANT
GAUZE SPONGE 4X4 12PLY STRL (GAUZE/BANDAGES/DRESSINGS) ×2 IMPLANT
GLOVE BIOGEL PI ORTHO PRO 7.5 (GLOVE) ×1
GLOVE BIOGEL PI ORTHO PRO SZ8 (GLOVE) ×1
GLOVE ORTHO TXT STRL SZ7.5 (GLOVE) ×2 IMPLANT
GLOVE PI ORTHO PRO STRL 7.5 (GLOVE) ×1 IMPLANT
GLOVE PI ORTHO PRO STRL SZ8 (GLOVE) ×1 IMPLANT
GLOVE SURG ORTHO 8.5 STRL (GLOVE) ×2 IMPLANT
GOWN STRL REUS W/ TWL LRG LVL3 (GOWN DISPOSABLE) ×2 IMPLANT
GOWN STRL REUS W/ TWL XL LVL3 (GOWN DISPOSABLE) ×2 IMPLANT
GOWN STRL REUS W/TWL LRG LVL3 (GOWN DISPOSABLE) ×4
GOWN STRL REUS W/TWL XL LVL3 (GOWN DISPOSABLE) ×4
HANDPIECE INTERPULSE COAX TIP (DISPOSABLE)
KIT BASIN OR (CUSTOM PROCEDURE TRAY) ×2 IMPLANT
KIT ROOM TURNOVER OR (KITS) ×2 IMPLANT
MANIFOLD NEPTUNE II (INSTRUMENTS) ×2 IMPLANT
NEEDLE 1/2 CIR MAYO (NEEDLE) ×2 IMPLANT
NEEDLE HYPO 25GX1X1/2 BEV (NEEDLE) ×2 IMPLANT
NS IRRIG 1000ML POUR BTL (IV SOLUTION) ×2 IMPLANT
PACK SHOULDER (CUSTOM PROCEDURE TRAY) ×2 IMPLANT
PAD ARMBOARD 7.5X6 YLW CONV (MISCELLANEOUS) ×4 IMPLANT
PIN GUIDE 1.2 (PIN) IMPLANT
PIN GUIDE GLENOPHERE 1.5MX300M (PIN) IMPLANT
PIN METAGLENE 2.5 (PIN) IMPLANT
SET HNDPC FAN SPRY TIP SCT (DISPOSABLE) IMPLANT
SLING ARM FOAM STRAP LRG (SOFTGOODS) ×2 IMPLANT
SLING ARM LRG ADULT FOAM STRAP (SOFTGOODS) IMPLANT
SLING ARM MED ADULT FOAM STRAP (SOFTGOODS) IMPLANT
SPONGE GAUZE 4X4 12PLY STER LF (GAUZE/BANDAGES/DRESSINGS) ×2 IMPLANT
SPONGE LAP 18X18 X RAY DECT (DISPOSABLE) ×2 IMPLANT
SPONGE LAP 4X18 X RAY DECT (DISPOSABLE) ×2 IMPLANT
STRIP CLOSURE SKIN 1/2X4 (GAUZE/BANDAGES/DRESSINGS) ×2 IMPLANT
SUCTION FRAZIER TIP 10 FR DISP (SUCTIONS) ×2 IMPLANT
SUT FIBERWIRE #2 38 T-5 BLUE (SUTURE) ×12
SUT MNCRL AB 4-0 PS2 18 (SUTURE) ×2 IMPLANT
SUT VIC AB 2-0 CT1 27 (SUTURE) ×1
SUT VIC AB 2-0 CT1 TAPERPNT 27 (SUTURE) ×1 IMPLANT
SUT VICRYL 0 CT 1 36IN (SUTURE) ×2 IMPLANT
SUTURE FIBERWR #2 38 T-5 BLUE (SUTURE) ×6 IMPLANT
SYR CONTROL 10ML LL (SYRINGE) ×2 IMPLANT
TOWEL OR 17X24 6PK STRL BLUE (TOWEL DISPOSABLE) ×2 IMPLANT
TOWEL OR 17X26 10 PK STRL BLUE (TOWEL DISPOSABLE) ×2 IMPLANT
TRAY FOLEY CATH 16FRSI W/METER (SET/KITS/TRAYS/PACK) IMPLANT
WATER STERILE IRR 1000ML POUR (IV SOLUTION) ×2 IMPLANT
YANKAUER SUCT BULB TIP NO VENT (SUCTIONS) ×2 IMPLANT

## 2013-12-03 NOTE — Transfer of Care (Signed)
Immediate Anesthesia Transfer of Care Note  Patient: Hannah Fischer  Procedure(s) Performed: Procedure(s): LEFT SHOULDER REVERSE ARTHROPLASTY (Left)  Patient Location: PACU  Anesthesia Type:GA combined with regional for post-op pain  Level of Consciousness: sedated  Airway & Oxygen Therapy: Patient Spontanous Breathing and Patient connected to face mask oxygen  Post-op Assessment: Report given to PACU RN and Post -op Vital signs reviewed and stable  Post vital signs: Reviewed and stable  Complications: No apparent anesthesia complications

## 2013-12-03 NOTE — Progress Notes (Signed)
Not moving at present time

## 2013-12-03 NOTE — Anesthesia Preprocedure Evaluation (Addendum)
Anesthesia Evaluation  Patient identified by MRN, date of birth, ID band Patient awake    Reviewed: Allergy & Precautions, H&P , NPO status , Patient's Chart, lab work & pertinent test results  Airway Mallampati: III  TM Distance: >3 FB Neck ROM: Full    Dental  (+) Dental Advisory Given   Pulmonary neg pulmonary ROS,  breath sounds clear to auscultation        Cardiovascular hypertension, Pt. on medications + CAD Rhythm:Regular Rate:Normal  10/2013 cath: mild nonobstructive CAD. EF 55%.   Neuro/Psych Anxiety TIA Neuromuscular disease    GI/Hepatic Neg liver ROS, GERD-  ,  Endo/Other  diabetesHypothyroidism Morbid obesity  Renal/GU negative Renal ROS     Musculoskeletal  (+) Arthritis -,   Abdominal   Peds  Hematology  (+) anemia ,   Anesthesia Other Findings   Reproductive/Obstetrics                           Anesthesia Physical Anesthesia Plan  ASA: III  Anesthesia Plan: General   Post-op Pain Management:    Induction: Intravenous  Airway Management Planned: Oral ETT  Additional Equipment:   Intra-op Plan:   Post-operative Plan: Extubation in OR  Informed Consent: I have reviewed the patients History and Physical, chart, labs and discussed the procedure including the risks, benefits and alternatives for the proposed anesthesia with the patient or authorized representative who has indicated his/her understanding and acceptance.   Dental advisory given  Plan Discussed with: CRNA and Surgeon  Anesthesia Plan Comments:         Anesthesia Quick Evaluation

## 2013-12-03 NOTE — Interval H&P Note (Signed)
History and Physical Interval Note:  12/03/2013 10:09 AM  Hannah Fischer  has presented today for surgery, with the diagnosis of LEFT SHOULDER OA/ROTATOR CUFF INSUFFICIENCY  The various methods of treatment have been discussed with the patient and family. After consideration of risks, benefits and other options for treatment, the patient has consented to  Procedure(s): LEFT SHOULDER REVERSE ARTHROPLASTY (Right) as a surgical intervention .  The patient's history has been reviewed, patient examined, no change in status, stable for surgery.  I have reviewed the patient's chart and labs.  Questions were answered to the patient's satisfaction.     Dealie Koelzer,STEVEN R

## 2013-12-03 NOTE — Progress Notes (Signed)
Orthopedics Progress Note  Subjective: Pain well controlled  Objective:  Filed Vitals:   12/03/13 1400  BP: 139/52  Pulse: 66  Temp: 97.4 F (36.3 C)  Resp: 20    General: Awake and alert  Musculoskeletal: left shoulder dressing CDI, wiggles fingers Neurovascularly intact  Lab Results  Component Value Date   WBC 3.8* 11/22/2013   HGB 11.8* 11/22/2013   HCT 36.9 11/22/2013   MCV 96.1 11/22/2013   PLT 190 11/22/2013       Component Value Date/Time   NA 143 11/22/2013 0945   K 3.9 11/22/2013 0945   CL 105 11/22/2013 0945   CO2 23 11/22/2013 0945   GLUCOSE 228* 11/22/2013 0945   BUN 21 11/22/2013 0945   CREATININE 0.76 11/22/2013 0945   CALCIUM 9.2 11/22/2013 0945   GFRNONAA 79* 11/22/2013 0945   GFRAA >90 11/22/2013 0945    Lab Results  Component Value Date   INR 0.9 10/15/2013   INR 0.86 11/15/2011   INR 1.04 12/27/2010   PROTIME 31.2* 05/12/2007   PROTIME 20.4* 12/24/2006    Assessment/Plan: Post Op check s/p Procedure(s): LEFT SHOULDER REVERSE ARTHROPLASTY Patient doing very well.  OT tomorrow and possible D/C tomorrow  Almedia BallsSteven R. Ranell PatrickNorris, MD 12/03/2013 3:50 PM

## 2013-12-03 NOTE — Progress Notes (Signed)
Utilization review completed.  

## 2013-12-03 NOTE — OR Nursing (Addendum)
Jeanell Sparrownita MasonRN documented on OR record from [id number]

## 2013-12-03 NOTE — Discharge Instructions (Signed)
Ice to the shoulder to relieve pain.  Do exercises every hour while awake  Keep the incision clean and dry and intact for one week, then ok to get wet in the shower.  No pushing, pulling, or lifting with the left arm.  May remove the sling in the home, but wear it out of the house. Follow up with Dr Ranell PatrickNorris in two weeks in the office  [phone]

## 2013-12-03 NOTE — Brief Op Note (Signed)
12/03/2013  12:48 PM  PATIENT:  Hannah Fischer  80 y.o. female  PRE-OPERATIVE DIAGNOSIS:  LEFT SHOULDER OA/ROTATOR CUFF INSUFFICIENCY  POST-OPERATIVE DIAGNOSIS:  LEFT SHOULDER OA/ROTATOR CUFF INSUFFICIENCY  PROCEDURE:  Procedure(s): LEFT SHOULDER REVERSE ARTHROPLASTY (Left), DePuy Delta Xtend  SURGEON:  Surgeon(s) and Role:    * Verlee RossettiSteven R Stepheny Canal, MD - Primary  PHYSICIAN ASSISTANT:   ASSISTANTS: Thea Gisthomas B Dixon, PA-C   ANESTHESIA:   regional and general  EBL:  Total I/O In: 1500 [I.V.:1500] Out: 100 [Blood:100]  BLOOD ADMINISTERED:none  DRAINS: none   LOCAL MEDICATIONS USED:  MARCAINE     SPECIMEN:  No Specimen  DISPOSITION OF SPECIMEN:  N/A  COUNTS:  YES  TOURNIQUET:  * No tourniquets in log *  DICTATION: .Other Dictation: Dictation Number [id number]  PLAN OF CARE: Admit to inpatient   PATIENT DISPOSITION:  PACU - hemodynamically stable.   Delay start of Pharmacological VTE agent (>24hrs) due to surgical blood loss or risk of bleeding: not applicable

## 2013-12-03 NOTE — Anesthesia Postprocedure Evaluation (Signed)
  Anesthesia Post-op Note  Patient: Hannah Fischer  Procedure(s) Performed: Procedure(s): LEFT SHOULDER REVERSE ARTHROPLASTY (Left)  Patient Location: PACU  Anesthesia Type:GA combined with regional for post-op pain  Level of Consciousness: awake, alert  and oriented  Airway and Oxygen Therapy: Patient Spontanous Breathing  Post-op Pain: none  Post-op Assessment: Post-op Vital signs reviewed  Post-op Vital Signs: Reviewed  Last Vitals:  Filed Vitals:   12/03/13 1400  BP: 139/52  Pulse: 66  Temp: 36.3 C  Resp: 20    Complications: No apparent anesthesia complications

## 2013-12-03 NOTE — Op Note (Signed)
NAMAnder Slade:  Fischer, Hannah               ACCOUNT NO.:  [id number]  MEDICAL RECORD NO.:  [id number]  LOCATION:  [id number]                        FACILITY:  [facility]  PHYSICIAN:  Almedia BallsSteven R. Ranell PatrickNorris, M.D. DATE OF BIRTH:  08/30/1932  DATE OF PROCEDURE:  12/03/2013 DATE OF DISCHARGE:                              OPERATIVE REPORT   PREOPERATIVE DIAGNOSIS:  Left shoulder pain secondary to rotator cuff insufficiency and end-stage arthritis.  POSTOPERATIVE DIAGNOSIS:  Left shoulder pain secondary to rotator cuff insufficiency and end-stage arthritis.  PROCEDURE PERFORMED:  Left shoulder reverse shoulder arthroplasty using DePuy delta extend prosthesis.  ATTENDING SURGEON:  Almedia BallsSteven R. Ranell PatrickNorris, M.D.  ASSISTANT:  Donnie Coffinhomas B. Dixon, PA-C, who who was scrubbed during the entire procedure and necessary for satisfactory completion of surgery.  ANESTHESIA:  General anesthesia was used plus interscalene block.  ESTIMATED BLOOD LOSS:  150 mL.  FLUID REPLACEMENT:  1500 mL mL crystalloid.  INSTRUMENT COUNTS:  Correct.  COMPLICATIONS:  There were no complications.  ANTIBIOTICS:  Perioperative antibiotics were given.  INDICATIONS:  The patient is a 80 year old female with worsening left shoulder pain secondary to end-stage arthritis.  The patient has rotator cuff insufficiency and has had poor function for years.  She presents for operative treatment to restore function, eliminate pain by a reverse shoulder arthroplasty.  Informed consent obtained.  DESCRIPTION OF PROCEDURE:  After adequate level of anesthesia achieved, the patient was positioned in a modified beach-chair position.  Left shoulder correctly identified, sterilely prepped and draped in usual manner.  Time-out called.  We entered the shoulder using standard deltopectoral approach starting at coracoid process extending down to the anterior humerus.  Dissection down through subcutaneous tissues using needle tip Bovie, identified the deltopectoral  plane into the cephalic vein with the deltoid laterally and pectoralis retracted medially.  The conjoined tendon identified and retracted medially.  We then released the subscapularis subperiosteally off the lesser tuberosity.  Placed #2 FiberWire suture in a modified Mason-Allen suture technique x2, to repair the subscap tendon.  We then released the anterior aspect of the rotator cuff, internally rotating and releasing back to about the teres minor.  We then went ahead and delivered the humeral head out of the wound and then placed our [facility] retractor. Next, we entered the proximal humerus using a the 6 mm reamer, reamed up to size 12 and then did our metaphyseal preparation with the metaphyseal reamer and reamed for the size 1 left prosthesis, so was a 12 stem with epi 1 left proximal portion.  We went ahead and inserted the trial prosthesis, subluxed the humerus posteriorly, and did a capsulectomy. Care taken towards protecting the axillary nerve.  We then placed our center drill pin centered on the inferior portion of the glenoid face. We then reamed for the metaglene and then drilled out the central peg hole for the metaglene and impacted metaglene in position, centered on that inferior scapular neck.  We then placed a 36 lock screw inferiorly, 30 proximally, and locked those in an 18 nonlocked posteriorly.  We then placed our glenoosphere which is a 38 standard glenoosphere and screwed that in position.  We made sure again that actually nerve and no  soft tissues caught in between the metaglene and the glenosphere.  Next, we trialed with a 38+ 6 trial prosthesis and reduced the shoulder.  We were happy with soft tissue balancing and stability.  We removed all trial components, we thoroughly irrigated the humeral shaft, placed 3 drill holes in the shaft and placed #2 FiberWire x2 for repair of the subscapularis.  Next, we infused impaction bone grafting technique and placed the  hydroxyapatite coated size 12 stem in epi 1 left humeral prosthesis in 10 degrees of retroversion.  We impacted that in there for good secure fit.  I then selected our 38+ 6 patella 38+ 6 poly, popped that onto the humeral side and then reduced the shoulder.  Again, we were happy with soft tissue balancing.  The axillary nerve was not under undue tension.  We thoroughly irrigated and then we repaired the rotator cuff back anatomically the greater tuberosity and the subscapularis back to the lesser tuberosity suturing directly the bone.  We also oversewn the rotator interval.  We had nice solid repair, did not tether or decreased range of motion.  All we needed to freed up the rotator cuff to make sure, it had good balance before repairing that.  Hopefully, it will enhance her strength and stability.  We thoroughly irrigated and then repaired deltopectoral interval with 0 Vicryl suture, followed by 2- 0 Vicryl subcutaneous closure and 4-0 Monocryl for skin.     Almedia BallsSteven R. Ranell PatrickNorris, M.D.     SRN/MEDQ  D:  12/03/2013  T:  12/03/2013  Job:  [id number]

## 2013-12-03 NOTE — OR Nursing (Signed)
Procedural verification documented in error. Names very close . Situation corrected.

## 2013-12-03 NOTE — Progress Notes (Signed)
Will place on patient when she is back in bed

## 2013-12-03 NOTE — Anesthesia Procedure Notes (Addendum)
Anesthesia Regional Block:  Interscalene brachial plexus block  Pre-Anesthetic Checklist: ,, timeout performed, Correct Patient, Correct Site, Correct Laterality, Correct Procedure, Correct Position, site marked, Risks and benefits discussed,  Surgical consent,  Pre-op evaluation,  At surgeon's request and post-op pain management  Laterality: Left  Prep: chloraprep       Needles:  Injection technique: Single-shot  Needle Type: Echogenic Stimulator Needle     Needle Length: 5cm 5 cm Needle Gauge: 21 and 21 G    Additional Needles:  Procedures: ultrasound guided (picture in chart) and nerve stimulator Interscalene brachial plexus block Narrative:  Start time: 12/03/2013 9:30 AM End time: 12/03/2013 9:40 AM Injection made incrementally with aspirations every 5 mL.  Events: injection painful  Performed by: Personally  Anesthesiologist: Marcene Duosobert Fitzgerald, MD  Additional Notes: Combined u/s and nerve stimulator technique used for block. Image obtained of brachial plexus between anterior and middle interscalene muscles. Needle advanced under live u/s guidance. Unable to obtain consistent muscle twitch but pt endorsed repetitive paresthesia in conjunction with nerve stimulator frequency. After negative aspiration injection of 1cc LA revealed paresthesia with resistance to injection. Injection immediately stopped and needle withdrawn slightly. Pt still endorsed repetitive sensation with nerve stimulator response. Negative aspiration and injection of LA revealed resolution of paresthesia and no resistance to injection identified. Visualization of spread of LA observed in real time around nerves. Pt reported resolution of pain after injection of total of 25cc's 0.5% bupi with 1:200k epi.    Procedure Name: Intubation Date/Time: 12/03/2013 10:47 AM Performed by: Brien MatesMAHONY, Jameer Storie D Pre-anesthesia Checklist: Patient identified, Emergency Drugs available, Suction available, Patient being  monitored and Timeout performed Patient Re-evaluated:Patient Re-evaluated prior to inductionOxygen Delivery Method: Circle system utilized Preoxygenation: Pre-oxygenation with 100% oxygen Intubation Type: IV induction Ventilation: Mask ventilation without difficulty and Oral airway inserted - appropriate to patient size Laryngoscope Size: Miller and 2 Grade View: Grade I Tube type: Oral Tube size: 7.0 mm Number of attempts: 1 Airway Equipment and Method: Stylet Placement Confirmation: ETT inserted through vocal cords under direct vision,  breath sounds checked- equal and bilateral and positive ETCO2 Secured at: 20 cm Tube secured with: Tape Dental Injury: Teeth and Oropharynx as per pre-operative assessment

## 2013-12-04 LAB — BASIC METABOLIC PANEL
Anion gap: 10 (ref 5–15)
BUN: 12 mg/dL (ref 6–23)
CO2: 27 mEq/L (ref 19–32)
Calcium: 8.3 mg/dL — ABNORMAL LOW (ref 8.4–10.5)
Chloride: 100 mEq/L (ref 96–112)
Creatinine, Ser: 0.6 mg/dL (ref 0.50–1.10)
GFR calc Af Amer: >90 mL/min (ref >90)
GFR calc non Af Amer: 85 mL/min — ABNORMAL LOW (ref >90)
Glucose, Bld: 189 mg/dL — ABNORMAL HIGH (ref 70–99)
Potassium: 3.6 mEq/L — ABNORMAL LOW (ref 3.7–5.3)
Sodium: 137 mEq/L (ref 137–147)

## 2013-12-04 LAB — HEMOGLOBIN AND HEMATOCRIT, BLOOD
HCT: 30.4 % — ABNORMAL LOW (ref 36.0–46.0)
Hemoglobin: 9.9 g/dL — ABNORMAL LOW (ref 12.0–15.0)

## 2013-12-04 LAB — GLUCOSE, CAPILLARY
Glucose-Capillary: 180 mg/dL — ABNORMAL HIGH (ref 70–99)
Glucose-Capillary: 224 mg/dL — ABNORMAL HIGH (ref 70–99)

## 2013-12-04 MED ORDER — HYDROCODONE-ACETAMINOPHEN 5-325 MG PO TABS
1.0000 | ORAL_TABLET | Freq: Four times a day (QID) | ORAL | Status: DC | PRN
  Administered 2013-12-04: 2 via ORAL
  Filled 2013-12-04: qty 2

## 2013-12-04 NOTE — Progress Notes (Signed)
    Subjective: 1 Day Post-Op Procedure(s) (LRB): LEFT SHOULDER REVERSE ARTHROPLASTY (Left) Patient reports pain as 3 on 0-10 scale.   Denies CP or SOB.  Voiding without difficulty. Positive flatus. Objective: Vital signs in last 24 hours: Temp:  [97.3 F (36.3 C)-99 F (37.2 C)] 99 F (37.2 C) (10/31 0514) Pulse Rate:  [66-84] 80 (10/31 0514) Resp:  [16-23] 16 (10/31 0514) BP: (138-224)/(51-94) 152/68 mmHg (10/31 0514) SpO2:  [94 %-100 %] 95 % (10/31 0514)  Intake/Output from previous day: 10/30 0701 - 10/31 0700 In: 2630 [P.O.:480; I.V.:2050] Out: 100 [Blood:100] Intake/Output this shift: Total I/O In: 240 [P.O.:240] Out: -   Labs:  Recent Labs  12/04/13 0725  HGB 9.9*    Recent Labs  12/04/13 0725  HCT 30.4*   No results found for this basename: NA, K, CL, CO2, BUN, CREATININE, GLUCOSE, CALCIUM,  in the last 72 hours No results found for this basename: LABPT, INR,  in the last 72 hours  Physical Exam: Neurologically intact ABD soft Intact pulses distally Incision: dressing C/D/I and no drainage Compartment soft  Assessment/Plan: 1 Day Post-Op Procedure(s) (LRB): LEFT SHOULDER REVERSE ARTHROPLASTY (Left) Advance diet Up with therapy If pain controlled with oral meds this AM will d/c this afternoon.  Venita LickBROOKS,Daryl Quiros D for Dr. Venita Lickahari Emme Rosenau [facility] [phone] 12/04/2013, 8:52 AM

## 2013-12-04 NOTE — Progress Notes (Signed)
Patient is refusing Novolog insulin.  She would like her Metformin restarted.

## 2013-12-06 ENCOUNTER — Encounter (HOSPITAL_COMMUNITY): Payer: Self-pay | Admitting: Orthopedic Surgery

## 2013-12-13 NOTE — Discharge Summary (Signed)
Physician Discharge Summary   Patient ID: Hannah Fischer MRN: [id number] DOB/AGE: 78/02/1932 78 y.o.  Admit date: 12/03/2013 Discharge date: 12/13/2013  Admission Diagnoses:  Active Problems:   Arthritis of shoulder region, left, degenerative   Discharge Diagnoses:  Same   Surgeries: Procedure(s): LEFT SHOULDER REVERSE ARTHROPLASTY on 12/03/2013   Consultants: Occupational Therapy  Discharged Condition: Stable  Hospital Course: Hannah OldJean G Tencza is an 80 y.o. female who was admitted 12/03/2013 with a chief complaint of left shoulder pain, and found to have a diagnosis of left shoulder OA.  They were brought to the operating room on 12/03/2013 and underwent the above named procedures.    The patient had an uncomplicated hospital course and was stable for discharge.  Recent vital signs:  Filed Vitals:   12/04/13 0514  BP: 152/68  Pulse: 80  Temp: 99 F (37.2 C)  Resp: 16    Recent laboratory studies:  Results for orders placed or performed during the hospital encounter of 12/03/13  Glucose, capillary  Result Value Ref Range   Glucose-Capillary 181 (H) 70 - 99 mg/dL  Glucose, capillary  Result Value Ref Range   Glucose-Capillary 202 (H) 70 - 99 mg/dL   Comment 1 Documented in Chart    Comment 2 Notify RN   Hemoglobin and hematocrit, blood  Result Value Ref Range   Hemoglobin 9.9 (L) 12.0 - 15.0 g/dL   HCT 40.930.4 (L) 81.136.0 - 91.446.0 %  Basic metabolic panel  Result Value Ref Range   Sodium 137 137 - 147 mEq/L   Potassium 3.6 (L) 3.7 - 5.3 mEq/L   Chloride 100 96 - 112 mEq/L   CO2 27 19 - 32 mEq/L   Glucose, Bld 189 (H) 70 - 99 mg/dL   BUN 12 6 - 23 mg/dL   Creatinine, Ser 7.820.60 0.50 - 1.10 mg/dL   Calcium 8.3 (L) 8.4 - 10.5 mg/dL   GFR calc non Af Amer 85 (L) >90 mL/min   GFR calc Af Amer >90 >90 mL/min   Anion gap 10 5 - 15  Glucose, capillary  Result Value Ref Range   Glucose-Capillary 176 (H) 70 - 99 mg/dL  Glucose, capillary  Result Value Ref Range   Glucose-Capillary 180 (H) 70 - 99 mg/dL  Glucose, capillary  Result Value Ref Range   Glucose-Capillary 224 (H) 70 - 99 mg/dL    Discharge Medications:     Medication List    TAKE these medications        ALPRAZolam 0.25 MG tablet  Commonly known as:  XANAX  Take 0.25 mg by mouth at bedtime as needed for anxiety or sleep.     clopidogrel 75 MG tablet  Commonly known as:  PLAVIX  Take 1 tablet (75 mg total) by mouth daily with breakfast.     Fish Oil 1000 MG Caps  Take 2 capsules by mouth 2 (two) times daily.     gabapentin 100 MG capsule  Commonly known as:  NEURONTIN  Take 100 mg by mouth 3 (three) times daily.     HYDROcodone-acetaminophen 5-325 MG per tablet  Commonly known as:  NORCO/VICODIN  Take 1 tablet by mouth every 6 (six) hours as needed for moderate pain.     HYDROcodone-acetaminophen 5-325 MG per tablet  Commonly known as:  NORCO  Take 1-2 tablets by mouth every 6 (six) hours as needed for moderate pain.     levothyroxine 100 MCG tablet  Commonly known as:  SYNTHROID, LEVOTHROID  Take 1  tablet (100 mcg total) by mouth daily.     losartan 50 MG tablet  Commonly known as:  COZAAR  Take 50 mg by mouth daily.     metFORMIN 1000 MG tablet  Commonly known as:  GLUCOPHAGE  Take 1,000 mg by mouth 2 (two) times daily with a meal.     omeprazole 20 MG capsule  Commonly known as:  PRILOSEC  Take 20 mg by mouth daily.     pioglitazone 45 MG tablet  Commonly known as:  ACTOS  Take 45 mg by mouth daily.     VITAMIN B-12 PO  Take 2 tablets by mouth daily.     Vitamin D3 2000 UNITS Tabs  Take 2,000 Units by mouth daily.        Diagnostic Studies: Dg Shoulder Left  12/03/2013   CLINICAL DATA:  Initial encounter for left shoulder reverse arthroplasty.  EXAM: LEFT SHOULDER - 2+ VIEW  COMPARISON:  None.  FINDINGS: 1318 hrs. Patient is status post right shoulder reversed arthroplasty earlier today. No evidence for immediate hardware complications. Gas in the  soft tissues is compatible with the immediate postoperative state of the patient.  IMPRESSION: Left shoulder replacement without complicating features.   Electronically Signed   By: Kennith CenterEric  Mansell M.D.   On: 12/03/2013 13:34    Disposition: 01-Home or Self Care      Discharge Instructions    Call MD / Call 911    Complete by:  As directed   If you experience chest pain or shortness of breath, CALL 911 and be transported to the hospital emergency room.  If you develope a fever above 101 F, pus (white drainage) or increased drainage or redness at the wound, or calf pain, call your surgeon's office.     Constipation Prevention    Complete by:  As directed   Drink plenty of fluids.  Prune juice may be helpful.  You may use a stool softener, such as Colace (over the counter) 100 mg twice a day.  Use MiraLax (over the counter) for constipation as needed.     Diet - low sodium heart healthy    Complete by:  As directed      Increase activity slowly as tolerated    Complete by:  As directed            Follow-up Information    Follow up with Quiera Diffee,STEVEN R, MD. Call in 2 weeks.   Specialty:  Orthopedic Surgery   Why:  [phone]   Contact information:   [address] [place] [address] [phone]        Signed: Verlee RossettiORRIS,STEVEN R 12/13/2013, 4:48 PM

## 2014-01-13 ENCOUNTER — Encounter (HOSPITAL_COMMUNITY): Payer: Self-pay | Admitting: Interventional Cardiology

## 2014-01-19 ENCOUNTER — Other Ambulatory Visit: Payer: Self-pay | Admitting: Specialist

## 2014-01-19 DIAGNOSIS — M545 Low back pain: Secondary | ICD-10-CM

## 2014-01-31 ENCOUNTER — Ambulatory Visit
Admission: RE | Admit: 2014-01-31 | Discharge: 2014-01-31 | Disposition: A | Discharge status: Home / Self Care | Payer: Medicare Other | Source: Ambulatory Visit | Attending: Specialist | Admitting: Specialist

## 2014-01-31 DIAGNOSIS — M545 Low back pain: Secondary | ICD-10-CM

## 2014-03-02 ENCOUNTER — Other Ambulatory Visit: Payer: Self-pay | Admitting: Family Medicine

## 2014-03-02 DIAGNOSIS — M79606 Pain in leg, unspecified: Secondary | ICD-10-CM

## 2014-03-08 ENCOUNTER — Inpatient Hospital Stay: Admission: RE | Admit: 2014-03-08 | Payer: Self-pay | Source: Ambulatory Visit

## 2014-03-15 ENCOUNTER — Ambulatory Visit
Admission: RE | Admit: 2014-03-15 | Discharge: 2014-03-15 | Disposition: A | Discharge status: Home / Self Care | Payer: Medicare Other | Source: Ambulatory Visit | Attending: Family Medicine | Admitting: Family Medicine

## 2014-03-15 DIAGNOSIS — M79606 Pain in leg, unspecified: Secondary | ICD-10-CM

## 2014-03-25 ENCOUNTER — Other Ambulatory Visit: Payer: Self-pay | Admitting: *Deleted

## 2014-03-25 DIAGNOSIS — M79609 Pain in unspecified limb: Secondary | ICD-10-CM

## 2014-04-18 ENCOUNTER — Encounter: Payer: Self-pay | Admitting: Vascular Surgery

## 2014-04-19 ENCOUNTER — Ambulatory Visit (INDEPENDENT_AMBULATORY_CARE_PROVIDER_SITE_OTHER): Payer: Medicare Other | Admitting: Vascular Surgery

## 2014-04-19 ENCOUNTER — Ambulatory Visit (HOSPITAL_COMMUNITY)
Admission: RE | Admit: 2014-04-19 | Discharge: 2014-04-19 | Disposition: A | Discharge status: Home / Self Care | Payer: Medicare Other | Source: Ambulatory Visit | Attending: Vascular Surgery | Admitting: Vascular Surgery

## 2014-04-19 ENCOUNTER — Encounter: Payer: Self-pay | Admitting: Vascular Surgery

## 2014-04-19 VITALS — BP 175/84 | HR 66 | Temp 97.3°F | Resp 16 | Ht 63.0 in | Wt 187.0 lb

## 2014-04-19 DIAGNOSIS — M79609 Pain in unspecified limb: Secondary | ICD-10-CM | POA: Diagnosis not present

## 2014-04-19 DIAGNOSIS — I739 Peripheral vascular disease, unspecified: Secondary | ICD-10-CM

## 2014-04-19 NOTE — Progress Notes (Signed)
Patient name: Hannah Fischer MRN: [id number] DOB: 01/07/1933 Sex: female   Referred by: Hyacinth MeekerMiller  Reason for referral:  Chief Complaint  Patient presents with  . PVD    BLE pain  R>L    Pt c/o pain in her right foot and that she falls frequently "when her legs give out".    Ref- Dr. Sigmund HazelLisa Miller    HISTORY OF PRESENT ILLNESS: Patient is today for discussion regarding right foot discomfort. She is very pleasant 81 year old female is here today with her husband. She reports chronic restless leg syndrome. She does report pain in her right foot. This can occur with walking and at rest. She describes this as being over the mid arch to metatarsal head. She describes this as an achy sensation. She denies any specific calf claudication type discomfort on either lower external. She does not have any history of tissue loss or wound healing issues regarding her feet. She does have a long history of orthopedic issues with bilateral knee replacements.  Past Medical History  Diagnosis Date  . Hypertension     on medication  . Seizures     once, age 87  . Complication of anesthesia     unable to breathe lying  flat on back  . Diabetes mellitus     type 2  niddm x 25 yrs  . Blood transfusion 1979    for anemia  . Anemia     chronic  . Hypothyroidism     takes levoxyl  . Arthritis     djd  . 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 Anxiety   . GERD (gastroesophageal reflux disease)   . Hyperlipemia   . Chest pain     evaluated @ [facility] cardiology; had a stress test 2012  . Diverticular disease   . Irritable bowel syndrome   . Neuromuscular disorder   . Neuropathy in diabetes   . Pulmonary embolism 06-2010    bilateral  . DJD (degenerative joint disease)     lumbar and cervical  . Abnormal nuclear stress test     2005, normal cath Dr. Elease HashimotoNahser, normal stress test 2012 Dr. Eldridge DaceVaranasi  . Other and unspecified angina pectoris     tests came back NOT heart related    Past  Surgical History  Procedure Laterality Date  . Eye surgery  2007    cataract ext/ iol rt eye  . Blepharoplasty  2012    rt eye  . Back surgery    . Lumbar disc surgery  1990  . Joint replacement  2001,2005    both knees  . Cholecystectomy    . Shoulder hemi-arthroplasty  12/25/2010    Procedure: SHOULDER HEMI-ARTHROPLASTY;  Surgeon: Cammy CopaGregory Scott Dean;  [facility];  Service: Orthopedics;  Laterality: Right;  . Cardiac catheterization      had one 10/2013  . Reverse shoulder arthroplasty Left 12/03/2013    Procedure: LEFT SHOULDER REVERSE ARTHROPLASTY;  Surgeon: Verlee RossettiSteven R Norris, MD;  [facility];  Service: Orthopedics;  Laterality: Left;  . Left heart catheterization with coronary angiogram N/A 10/18/2013    Procedure: LEFT HEART CATHETERIZATION WITH CORONARY ANGIOGRAM;  Surgeon: Corky CraftsJayadeep S Varanasi, MD;  [facility];  Service: Cardiovascular;  Laterality: N/A;    History   Social History  . Marital Status: Married    Spouse Name: N/A  . Number of Children: N/A  . Years of Education: N/A  Occupational History  . Not on file.   Social History Main Topics  . Smoking status: Never Smoker   . Smokeless tobacco: Never Used  . Alcohol Use: No  . Drug Use: No  . Sexual Activity: No   Other Topics Concern  . Not on file   Social History Narrative    Family History  Problem Relation Age of Onset  . Diabetes Mother   . Heart disease Mother   . Heart disease Sister   . Heart disease Brother     Allergies as of 04/19/2014 - Review Complete 04/19/2014  Allergen Reaction Noted  . Sulfa antibiotics Other (See Comments) 12/20/2010  . Sulfites Other (See Comments) 12/20/2010  . Codeine Anxiety 12/20/2010    Current Outpatient Prescriptions on File Prior to Visit  Medication Sig Dispense Refill  . ALPRAZolam (XANAX) 0.25 MG tablet Take 0.25 mg by mouth at bedtime as needed for anxiety or sleep.     . Cholecalciferol (VITAMIN D3) 2000 UNITS TABS Take 2,000  Units by mouth daily.     . clopidogrel (PLAVIX) 75 MG tablet Take 1 tablet (75 mg total) by mouth daily with breakfast. 30 tablet 1  . Cyanocobalamin (VITAMIN B-12 PO) Take 2 tablets by mouth daily.     Marland Kitchen gabapentin (NEURONTIN) 100 MG capsule Take 100 mg by mouth 3 (three) times daily.     Marland Kitchen HYDROcodone-acetaminophen (NORCO) 5-325 MG per tablet Take 1-2 tablets by mouth every 6 (six) hours as needed for moderate pain. 60 tablet 0  . HYDROcodone-acetaminophen (NORCO/VICODIN) 5-325 MG per tablet Take 1 tablet by mouth every 6 (six) hours as needed for moderate pain.    Marland Kitchen levothyroxine (SYNTHROID, LEVOTHROID) 100 MCG tablet Take 1 tablet (100 mcg total) by mouth daily. 30 tablet 1  . losartan (COZAAR) 50 MG tablet Take 50 mg by mouth daily.     . metFORMIN (GLUCOPHAGE) 1000 MG tablet Take 1,000 mg by mouth 2 (two) times daily with a meal.    . Omega-3 Fatty Acids (FISH OIL) 1000 MG CAPS Take 2 capsules by mouth 2 (two) times daily.     Marland Kitchen omeprazole (PRILOSEC) 20 MG capsule Take 20 mg by mouth daily.      . pioglitazone (ACTOS) 45 MG tablet Take 45 mg by mouth daily.       No current facility-administered medications on file prior to visit.     REVIEW OF SYSTEMS:  Positives indicated with an "X"  CARDIOVASCULAR:   chest pain    chest pressure    palpitations    orthopnea   [x ] dyspnea on exertion    claudication    rest pain    DVT    phlebitis PULMONARY:    productive cough    asthma    wheezing NEUROLOGIC:   [x ] weakness   paresthesias   aphasia   amaurosis  [x ] dizziness HEMATOLOGIC:    bleeding problems    clotting disorders MUSCULOSKELETAL:   joint pain    joint swelling GASTROINTESTINAL:   blood in stool    hematemesis GENITOURINARY:    dysuria    hematuria PSYCHIATRIC:   history of major depression INTEGUMENTARY:   rashes   ulcers CONSTITUTIONAL:   fever    chills  PHYSICAL  EXAMINATION:  General: The patient is a well-nourished female,  in no acute distress. Obese Vital signs are BP 175/84 mmHg  Pulse 66  Temp(Src) 97.3 F (36.3 C) (Oral)  Resp 16  Ht  (1.6 m)  Wt 187 lb (84.823 kg)  BMI 33.13 kg/m2  SpO2 99% Pulmonary: There is a good air exchange bilaterally without wheezing or rales. Abdomen: Soft and non-tender with normal pitch bowel sounds. Musculoskeletal: There are no major deformities.  There is no significant extremity pain. Neurologic: No focal weakness or paresthesias are detected, Skin: There are no ulcer or rashes noted. Psychiatric: The patient has normal affect. Cardiovascular: 2+ radial pulses bilaterally and 2+ dorsalis pedis pulses bilaterally Regular rate and rhythm without murmur   [facility] Vascular Lab Studies:  Ordered and Independently Reviewed her ankle arm index is slightly diminished at 0.83 on the right. She has triphasic anterior tibial waveform. Left ankle arm index is normal. She underwent duplex imaging of her right leg arteries with no evidence of significant stenosis. She does have some distal occlusive disease intertibial vessels.  Impression and Plan:  I discussed this at length with the patient and her husband present. She has a near normal flow to her right foot I do not feel that there is any evidence of arterial insufficiency is causing her discomfort. I reassured her that this is not not limb threatening. This may be orthopedic or neurogenic. Will continue her usual activities and see [place] again on as-needed basis. If this progresses she will discuss this with Dr. Hyacinth Meeker for further evaluation.    Paydon Carll [facility] Office: [phone]

## 2014-04-21 ENCOUNTER — Encounter: Payer: Self-pay | Admitting: Family Medicine

## 2015-04-22 DIAGNOSIS — E119 Type 2 diabetes mellitus without complications: Secondary | ICD-10-CM | POA: Diagnosis not present

## 2015-04-25 DIAGNOSIS — Z96612 Presence of left artificial shoulder joint: Secondary | ICD-10-CM | POA: Diagnosis not present

## 2015-04-25 DIAGNOSIS — Z471 Aftercare following joint replacement surgery: Secondary | ICD-10-CM | POA: Diagnosis not present

## 2015-05-15 DIAGNOSIS — R300 Dysuria: Secondary | ICD-10-CM | POA: Diagnosis not present

## 2015-05-15 DIAGNOSIS — N390 Urinary tract infection, site not specified: Secondary | ICD-10-CM | POA: Diagnosis not present

## 2015-05-30 DIAGNOSIS — R309 Painful micturition, unspecified: Secondary | ICD-10-CM | POA: Diagnosis not present

## 2015-07-26 DIAGNOSIS — E7800 Pure hypercholesterolemia, unspecified: Secondary | ICD-10-CM | POA: Diagnosis not present

## 2015-07-26 DIAGNOSIS — E039 Hypothyroidism, unspecified: Secondary | ICD-10-CM | POA: Diagnosis not present

## 2015-07-26 DIAGNOSIS — Z6832 Body mass index (BMI) 32.0-32.9, adult: Secondary | ICD-10-CM | POA: Diagnosis not present

## 2015-07-26 DIAGNOSIS — K219 Gastro-esophageal reflux disease without esophagitis: Secondary | ICD-10-CM | POA: Diagnosis not present

## 2015-07-26 DIAGNOSIS — G459 Transient cerebral ischemic attack, unspecified: Secondary | ICD-10-CM | POA: Diagnosis not present

## 2015-07-26 DIAGNOSIS — F5101 Primary insomnia: Secondary | ICD-10-CM | POA: Diagnosis not present

## 2015-07-26 DIAGNOSIS — I10 Essential (primary) hypertension: Secondary | ICD-10-CM | POA: Diagnosis not present

## 2015-07-26 DIAGNOSIS — M47896 Other spondylosis, lumbar region: Secondary | ICD-10-CM | POA: Diagnosis not present

## 2015-07-26 DIAGNOSIS — E119 Type 2 diabetes mellitus without complications: Secondary | ICD-10-CM | POA: Diagnosis not present

## 2015-07-26 DIAGNOSIS — M859 Disorder of bone density and structure, unspecified: Secondary | ICD-10-CM | POA: Diagnosis not present

## 2015-08-31 DIAGNOSIS — E1142 Type 2 diabetes mellitus with diabetic polyneuropathy: Secondary | ICD-10-CM | POA: Diagnosis not present

## 2015-08-31 DIAGNOSIS — M4806 Spinal stenosis, lumbar region: Secondary | ICD-10-CM | POA: Diagnosis not present

## 2015-08-31 DIAGNOSIS — M170 Bilateral primary osteoarthritis of knee: Secondary | ICD-10-CM | POA: Diagnosis not present

## 2015-12-06 DIAGNOSIS — Z23 Encounter for immunization: Secondary | ICD-10-CM | POA: Diagnosis not present

## 2015-12-07 DIAGNOSIS — H04123 Dry eye syndrome of bilateral lacrimal glands: Secondary | ICD-10-CM | POA: Diagnosis not present

## 2015-12-07 DIAGNOSIS — H524 Presbyopia: Secondary | ICD-10-CM | POA: Diagnosis not present

## 2015-12-07 DIAGNOSIS — E113213 Type 2 diabetes mellitus with mild nonproliferative diabetic retinopathy with macular edema, bilateral: Secondary | ICD-10-CM | POA: Diagnosis not present

## 2015-12-09 ENCOUNTER — Other Ambulatory Visit (INDEPENDENT_AMBULATORY_CARE_PROVIDER_SITE_OTHER): Payer: Self-pay | Admitting: Specialist

## 2015-12-11 NOTE — Telephone Encounter (Signed)
Noted, Thanks

## 2015-12-11 NOTE — Telephone Encounter (Signed)
Ok to refill 

## 2015-12-27 ENCOUNTER — Telehealth (INDEPENDENT_AMBULATORY_CARE_PROVIDER_SITE_OTHER): Payer: Self-pay | Admitting: *Deleted

## 2015-12-27 MED ORDER — HYDROCODONE-ACETAMINOPHEN 5-325 MG PO TABS: 1.0000 | ORAL_TABLET | Freq: Four times a day (QID) | ORAL | 0 refills | Status: DC | PRN

## 2015-12-27 NOTE — Telephone Encounter (Signed)
Called patient and advised rx at front desk ready to be picked up Thanks. Herbert SetaHeather

## 2015-12-27 NOTE — Telephone Encounter (Signed)
Ok to refill 

## 2015-12-27 NOTE — Telephone Encounter (Signed)
Pt called for RX refill on hydrocodone.

## 2016-01-08 DIAGNOSIS — Z6832 Body mass index (BMI) 32.0-32.9, adult: Secondary | ICD-10-CM | POA: Diagnosis not present

## 2016-01-08 DIAGNOSIS — E1140 Type 2 diabetes mellitus with diabetic neuropathy, unspecified: Secondary | ICD-10-CM | POA: Diagnosis not present

## 2016-01-08 DIAGNOSIS — E6609 Other obesity due to excess calories: Secondary | ICD-10-CM | POA: Diagnosis not present

## 2016-01-08 DIAGNOSIS — F5101 Primary insomnia: Secondary | ICD-10-CM | POA: Diagnosis not present

## 2016-01-08 DIAGNOSIS — E039 Hypothyroidism, unspecified: Secondary | ICD-10-CM | POA: Diagnosis not present

## 2016-01-08 DIAGNOSIS — Z7984 Long term (current) use of oral hypoglycemic drugs: Secondary | ICD-10-CM | POA: Diagnosis not present

## 2016-01-08 DIAGNOSIS — I10 Essential (primary) hypertension: Secondary | ICD-10-CM | POA: Diagnosis not present

## 2016-02-01 ENCOUNTER — Telehealth (INDEPENDENT_AMBULATORY_CARE_PROVIDER_SITE_OTHER): Payer: Self-pay | Admitting: Specialist

## 2016-02-01 NOTE — Telephone Encounter (Signed)
Patient is requesting a hydrocodone refill  Cb#: [phone]

## 2016-02-02 ENCOUNTER — Other Ambulatory Visit (INDEPENDENT_AMBULATORY_CARE_PROVIDER_SITE_OTHER): Payer: Self-pay

## 2016-02-02 MED ORDER — HYDROCODONE-ACETAMINOPHEN 5-325 MG PO TABS: 1.0000 | ORAL_TABLET | Freq: Four times a day (QID) | ORAL | 0 refills | Status: DC | PRN

## 2016-02-02 NOTE — Telephone Encounter (Signed)
Yes refill #20

## 2016-02-02 NOTE — Telephone Encounter (Signed)
LMOM. Rx ready at front desk.

## 2016-02-02 NOTE — Telephone Encounter (Signed)
Can you advise since Nitka not in office today?

## 2016-02-14 ENCOUNTER — Ambulatory Visit (INDEPENDENT_AMBULATORY_CARE_PROVIDER_SITE_OTHER): Payer: Medicare Other | Admitting: Specialist

## 2016-02-14 ENCOUNTER — Encounter (INDEPENDENT_AMBULATORY_CARE_PROVIDER_SITE_OTHER): Payer: Self-pay | Admitting: Specialist

## 2016-02-14 ENCOUNTER — Encounter (INDEPENDENT_AMBULATORY_CARE_PROVIDER_SITE_OTHER): Payer: Self-pay

## 2016-02-14 ENCOUNTER — Ambulatory Visit (INDEPENDENT_AMBULATORY_CARE_PROVIDER_SITE_OTHER): Payer: Self-pay

## 2016-02-14 VITALS — BP 153/69 | HR 62 | Ht 60.0 in | Wt 182.0 lb

## 2016-02-14 DIAGNOSIS — M48062 Spinal stenosis, lumbar region with neurogenic claudication: Secondary | ICD-10-CM

## 2016-02-14 DIAGNOSIS — M5442 Lumbago with sciatica, left side: Secondary | ICD-10-CM | POA: Diagnosis not present

## 2016-02-14 DIAGNOSIS — M4156 Other secondary scoliosis, lumbar region: Secondary | ICD-10-CM | POA: Diagnosis not present

## 2016-02-14 DIAGNOSIS — M5441 Lumbago with sciatica, right side: Secondary | ICD-10-CM | POA: Diagnosis not present

## 2016-02-14 MED ORDER — HYDROCODONE-ACETAMINOPHEN 5-325 MG PO TABS: 1.0000 | ORAL_TABLET | Freq: Four times a day (QID) | ORAL | 0 refills | Status: DC | PRN

## 2016-02-14 NOTE — Patient Instructions (Signed)
Avoid bending, stooping and avoid lifting weights greater than 10 lbs. Avoid prolong standing and walking. Avoid frequent bending and stooping  No lifting greater than 10 lbs. May use ice or moist heat for pain. Weight loss is of benefit. Handicap license is approved.  

## 2016-02-14 NOTE — Progress Notes (Signed)
Office Visit Note   Patient: Hannah Fischer           Date of Birth: 01/31/1933           MRN: [id number] Visit Date: 02/14/2016              Requested by: Hannah HazelLisa Miller, MD [address] [place], [place] [address] PCP: Hannah LabellaMILLER,Hannah LYNN, MD   Assessment & Plan: Visit Diagnoses:  1. Bilateral low back pain with bilateral sciatica, unspecified chronicity   2. Spinal stenosis of lumbar region with neurogenic claudication   3. Other secondary scoliosis, lumbar region     Plan: Avoid bending, stooping and avoid lifting weights greater than 10 lbs. Avoid prolong standing and walking. Avoid frequent bending and stooping  No lifting greater than 10 lbs. May use ice or moist heat for pain. Weight loss is of benefit. Handicap license is approved.   Follow-Up Instructions: Return in about 3 months (around 05/14/2016).   Orders:  Orders Placed This Encounter  Procedures  . XR Lumbar Spine 2-3 Views   Meds ordered this encounter  Medications  . HYDROcodone-acetaminophen (NORCO/VICODIN) 5-325 MG tablet    Sig: Take 1 tablet by mouth every 6 (six) hours as needed for moderate pain.    Dispense:  50 tablet    Refill:  0      Procedures: No procedures performed   Clinical Data: No additional findings.   Subjective: Chief Complaint  Patient presents with  . Lower Back - Pain  . Right Leg - Pain  . Left Leg - Pain    [name] is here for her back pain. She states that she is mainly having low back pain but that it also going down her legs.  She states that she has not had an injury to her back. Not sleeping well, may relate to sleep apnea not back pain while in bed. Pain worsens in back and thigh with first standing and using a walker with wheels and a chair to ambulate. Bowels and bladder functioning normally. Has only one kidney so should avoid NSAIDs. Difficulty walking a distance, has to sit to decrease her pain. Does experience numbness and paresthesias In both  legs. Feet swell but respond to elevation.    Review of Systems  Constitutional: Negative.   HENT: Negative.   Eyes: Negative.   Respiratory: Negative.   Cardiovascular: Negative.   Gastrointestinal: Negative.   Endocrine: Negative.   Genitourinary: Negative.   Musculoskeletal: Negative.   Skin: Negative.   Allergic/Immunologic: Negative.   Neurological: Negative.   Hematological: Negative.   Psychiatric/Behavioral: Negative.      Objective: Vital Signs: BP (!) 153/69 (BP Location: Left Arm, Patient Position: Sitting)   Pulse 62   Ht 5' (1.524 m)   Wt 182 lb (82.6 kg)   BMI 35.54 kg/m   Physical Exam  Constitutional: She is oriented to person, place, and time. She appears well-developed and well-nourished.  HENT:  Head: Normocephalic and atraumatic.  Eyes: EOM are normal. Pupils are equal, round, and reactive to light.  Neck: Normal range of motion. Neck supple.  Pulmonary/Chest: Effort normal and breath sounds normal.  Abdominal: Soft. Bowel sounds are normal.  Neurological: She is alert and oriented to person, place, and time.  Skin: Skin is warm and dry.  Psychiatric: She has a normal mood and affect. Her behavior is normal. Judgment and thought content normal.    Back Exam   Tenderness  The patient is experiencing  tenderness in the lumbar.  Range of Motion  Extension: abnormal  Flexion: abnormal  Lateral Bend Left: abnormal  Rotation Left: abnormal   Muscle Strength  Right Quadriceps:  5/5  Left Quadriceps:  5/5  Right Hamstrings:  5/5  Left Hamstrings:  5/5   Tests  Straight leg raise right: negative Straight leg raise left: negative  Reflexes  Patellar: normal Achilles: normal Biceps: normal Babinski's sign: normal   Other  Toe Walk: normal Heel Walk: normal Sensation: normal Gait: normal  Erythema: no back redness Scars: absent      Specialty Comments:  No specialty comments available.  Imaging: Xr Lumbar Spine 2-3  Views  Result Date: 02/14/2016 AP and Lateral flexion and extension radiographs of the lumbar spine shows advanced degenerative disc disease with disc space narrowing and lateral and anterior osteophytes left L1-2 and L2-3, left lateral listhesis L2-3 and L3-4, anterolisthesis grade 1 at L4-5 and L5-S1. Severe spondylosis of the facets nearly every level with a collapsing degenerative scoliosis of the lumbar spine apex to the right at L2-3. Bilateral SI sclerosis. Hip joint line is well maintained both hips.    PMFS History: Patient Active Problem List   Diagnosis Date Noted  . Arthritis of shoulder region, left, degenerative 12/03/2013  . Nonspecific abnormal electrocardiogram (ECG) (EKG) 10/15/2013  . Other and unspecified angina pectoris 10/15/2013  . TIA (transient ischemic attack) 11/15/2011  . HTN (hypertension) 11/15/2011  . Diabetes mellitus (HCC) 11/15/2011  . Hypothyroidism 11/15/2011  . Dyslipidemia 11/15/2011  . GERD (gastroesophageal reflux disease) 11/15/2011  . Pyuria 11/15/2011   Past Medical History:  Diagnosis Date  . Abnormal nuclear stress test    2005, normal cath Dr. Elease Hashimoto, normal stress test 2012 Dr. Eldridge Dace  . Anemia    chronic  . Anxiety   . Arthritis    djd  . Blood transfusion 1979   for anemia  . Chest pain    evaluated @ [facility] cardiology; had a stress test 2012  . Complication of anesthesia    unable to breathe lying  flat on back  . Diabetes mellitus    type 2  niddm x 25 yrs  . Diverticular disease   . DJD (degenerative joint disease)    lumbar and cervical  . GERD (gastroesophageal reflux disease)   . Hyperlipemia   . Hypertension    on medication  . Hypothyroidism    takes levoxyl  . Irritable bowel syndrome   . 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 Neuromuscular disorder (HCC)   . Neuropathy in diabetes (HCC)   . Other and unspecified angina pectoris    tests came back NOT heart related  . Pulmonary embolism (HCC)  06-2010   bilateral  . Seizures (HCC)    once, age 87    Family History  Problem Relation Age of Onset  . Diabetes Mother   . Heart disease Mother   . Heart disease Sister   . Heart disease Brother     Past Surgical History:  Procedure Laterality Date  . BACK SURGERY    . BLEPHAROPLASTY  2012   rt eye  . CARDIAC CATHETERIZATION     had one 10/2013  . CHOLECYSTECTOMY    . EYE SURGERY  2007   cataract ext/ iol rt eye  . JOINT REPLACEMENT  2001,2005   both knees  . LEFT HEART CATHETERIZATION WITH CORONARY ANGIOGRAM N/A 10/18/2013   Procedure: LEFT HEART CATHETERIZATION WITH CORONARY ANGIOGRAM;  Surgeon: Corky Crafts, MD;  Location: [facility] C[facility] LAB;  Service: Cardiovascular;  Laterality: N/A;  . LUMBAR DISC SURGERY  1990  . REVERSE SHOULDER ARTHROPLASTY Left 12/03/2013   Procedure: LEFT SHOULDER REVERSE ARTHROPLASTY;  Surgeon: Verlee Rossetti, MD;  Location: [facility] OR;  Service: Orthopedics;  Laterality: Left;  . SHOULDER HEMI-ARTHROPLASTY  12/25/2010   Procedure: SHOULDER HEMI-ARTHROPLASTY;  Surgeon: Cammy Copa;  Location: [facility] OR;  Service: Orthopedics;  Laterality: Right;   Social History   Occupational History  . Not on file.   Social History Main Topics  . Smoking status: Never Smoker  . Smokeless tobacco: Never Used  . Alcohol use No  . Drug use: No  . Sexual activity: No

## 2016-02-15 DIAGNOSIS — H35372 Puckering of macula, left eye: Secondary | ICD-10-CM | POA: Diagnosis not present

## 2016-02-15 DIAGNOSIS — H35033 Hypertensive retinopathy, bilateral: Secondary | ICD-10-CM | POA: Diagnosis not present

## 2016-02-15 DIAGNOSIS — H43813 Vitreous degeneration, bilateral: Secondary | ICD-10-CM | POA: Diagnosis not present

## 2016-02-15 DIAGNOSIS — E113293 Type 2 diabetes mellitus with mild nonproliferative diabetic retinopathy without macular edema, bilateral: Secondary | ICD-10-CM | POA: Diagnosis not present

## 2016-03-02 DIAGNOSIS — E119 Type 2 diabetes mellitus without complications: Secondary | ICD-10-CM | POA: Diagnosis not present

## 2016-03-02 DIAGNOSIS — E1159 Type 2 diabetes mellitus with other circulatory complications: Secondary | ICD-10-CM | POA: Diagnosis not present

## 2016-03-02 DIAGNOSIS — M2062 Acquired deformities of toe(s), unspecified, left foot: Secondary | ICD-10-CM | POA: Diagnosis not present

## 2016-03-02 DIAGNOSIS — M2061 Acquired deformities of toe(s), unspecified, right foot: Secondary | ICD-10-CM | POA: Diagnosis not present

## 2016-04-19 DIAGNOSIS — S0232XA Fracture of orbital floor, left side, initial encounter for closed fracture: Secondary | ICD-10-CM | POA: Diagnosis not present

## 2016-04-19 DIAGNOSIS — R220 Localized swelling, mass and lump, head: Secondary | ICD-10-CM | POA: Diagnosis not present

## 2016-04-19 DIAGNOSIS — S0512XA Contusion of eyeball and orbital tissues, left eye, initial encounter: Secondary | ICD-10-CM | POA: Diagnosis not present

## 2016-04-19 DIAGNOSIS — S0990XA Unspecified injury of head, initial encounter: Secondary | ICD-10-CM | POA: Diagnosis not present

## 2016-04-19 DIAGNOSIS — S0081XA Abrasion of other part of head, initial encounter: Secondary | ICD-10-CM | POA: Diagnosis not present

## 2016-04-19 DIAGNOSIS — S022XXA Fracture of nasal bones, initial encounter for closed fracture: Secondary | ICD-10-CM | POA: Diagnosis not present

## 2016-04-19 DIAGNOSIS — S199XXA Unspecified injury of neck, initial encounter: Secondary | ICD-10-CM | POA: Diagnosis not present

## 2016-04-22 DIAGNOSIS — S022XXD Fracture of nasal bones, subsequent encounter for fracture with routine healing: Secondary | ICD-10-CM | POA: Diagnosis not present

## 2016-04-22 DIAGNOSIS — S0292XD Unspecified fracture of facial bones, subsequent encounter for fracture with routine healing: Secondary | ICD-10-CM | POA: Diagnosis not present

## 2016-04-22 DIAGNOSIS — R350 Frequency of micturition: Secondary | ICD-10-CM | POA: Diagnosis not present

## 2016-05-13 DIAGNOSIS — H524 Presbyopia: Secondary | ICD-10-CM | POA: Diagnosis not present

## 2016-05-15 ENCOUNTER — Ambulatory Visit (INDEPENDENT_AMBULATORY_CARE_PROVIDER_SITE_OTHER): Payer: Medicare Other | Admitting: Specialist

## 2016-05-29 ENCOUNTER — Telehealth (INDEPENDENT_AMBULATORY_CARE_PROVIDER_SITE_OTHER): Payer: Self-pay | Admitting: Specialist

## 2016-05-29 NOTE — Telephone Encounter (Signed)
Pt requests refill of Hydrocodone.  [phone]

## 2016-05-30 ENCOUNTER — Other Ambulatory Visit (INDEPENDENT_AMBULATORY_CARE_PROVIDER_SITE_OTHER): Payer: Self-pay | Admitting: Specialist

## 2016-05-30 DIAGNOSIS — M48062 Spinal stenosis, lumbar region with neurogenic claudication: Secondary | ICD-10-CM

## 2016-05-30 DIAGNOSIS — M4156 Other secondary scoliosis, lumbar region: Secondary | ICD-10-CM

## 2016-05-30 MED ORDER — HYDROCODONE-ACETAMINOPHEN 5-325 MG PO TABS: 1.0000 | ORAL_TABLET | Freq: Four times a day (QID) | ORAL | 0 refills | Status: DC | PRN

## 2016-05-30 NOTE — Telephone Encounter (Signed)
Please advise 

## 2016-05-31 NOTE — Progress Notes (Signed)
Pt advised that rx is ready for pick up at the front desk

## 2016-05-31 NOTE — Progress Notes (Signed)
Lm w/ husband to have her call me back

## 2016-05-31 NOTE — Telephone Encounter (Signed)
Lm w/ husband to have her call me back  

## 2016-05-31 NOTE — Telephone Encounter (Signed)
Pt advised that rx is ready for pick up at the front desk 

## 2016-06-04 DIAGNOSIS — T2122XA Burn of second degree of abdominal wall, initial encounter: Secondary | ICD-10-CM | POA: Diagnosis not present

## 2016-06-04 DIAGNOSIS — T24211A Burn of second degree of right thigh, initial encounter: Secondary | ICD-10-CM | POA: Diagnosis not present

## 2016-06-06 DIAGNOSIS — T2122XD Burn of second degree of abdominal wall, subsequent encounter: Secondary | ICD-10-CM | POA: Diagnosis not present

## 2016-06-06 DIAGNOSIS — T24211D Burn of second degree of right thigh, subsequent encounter: Secondary | ICD-10-CM | POA: Diagnosis not present

## 2016-06-08 DIAGNOSIS — T2122XD Burn of second degree of abdominal wall, subsequent encounter: Secondary | ICD-10-CM | POA: Diagnosis not present

## 2016-06-08 DIAGNOSIS — T24211D Burn of second degree of right thigh, subsequent encounter: Secondary | ICD-10-CM | POA: Diagnosis not present

## 2016-06-10 DIAGNOSIS — T24211D Burn of second degree of right thigh, subsequent encounter: Secondary | ICD-10-CM | POA: Diagnosis not present

## 2016-06-10 DIAGNOSIS — T2122XD Burn of second degree of abdominal wall, subsequent encounter: Secondary | ICD-10-CM | POA: Diagnosis not present

## 2016-06-13 DIAGNOSIS — T24211D Burn of second degree of right thigh, subsequent encounter: Secondary | ICD-10-CM | POA: Diagnosis not present

## 2016-06-13 DIAGNOSIS — T2122XD Burn of second degree of abdominal wall, subsequent encounter: Secondary | ICD-10-CM | POA: Diagnosis not present

## 2016-06-17 DIAGNOSIS — T24211D Burn of second degree of right thigh, subsequent encounter: Secondary | ICD-10-CM | POA: Diagnosis not present

## 2016-06-17 DIAGNOSIS — T2122XD Burn of second degree of abdominal wall, subsequent encounter: Secondary | ICD-10-CM | POA: Diagnosis not present

## 2016-06-20 DIAGNOSIS — T2122XD Burn of second degree of abdominal wall, subsequent encounter: Secondary | ICD-10-CM | POA: Diagnosis not present

## 2016-06-20 DIAGNOSIS — T24211D Burn of second degree of right thigh, subsequent encounter: Secondary | ICD-10-CM | POA: Diagnosis not present

## 2016-06-24 DIAGNOSIS — K219 Gastro-esophageal reflux disease without esophagitis: Secondary | ICD-10-CM | POA: Diagnosis not present

## 2016-06-24 DIAGNOSIS — I10 Essential (primary) hypertension: Secondary | ICD-10-CM | POA: Diagnosis not present

## 2016-06-24 DIAGNOSIS — G459 Transient cerebral ischemic attack, unspecified: Secondary | ICD-10-CM | POA: Diagnosis not present

## 2016-06-24 DIAGNOSIS — E039 Hypothyroidism, unspecified: Secondary | ICD-10-CM | POA: Diagnosis not present

## 2016-06-26 ENCOUNTER — Encounter: Payer: Self-pay | Admitting: Physical Medicine & Rehabilitation

## 2016-07-03 ENCOUNTER — Telehealth (INDEPENDENT_AMBULATORY_CARE_PROVIDER_SITE_OTHER): Payer: Self-pay

## 2016-07-03 NOTE — Telephone Encounter (Signed)
Patient called to see if she had been referred to another doctor by Dr. Otelia SergeantNitka.  Advised patient that she was referred to a Pain clinic.

## 2016-07-12 ENCOUNTER — Other Ambulatory Visit: Payer: Self-pay | Admitting: *Deleted

## 2016-07-12 ENCOUNTER — Encounter: Payer: Medicare Other | Admitting: Physical Medicine & Rehabilitation

## 2016-07-12 ENCOUNTER — Encounter: Payer: Medicare Other | Attending: Physical Medicine & Rehabilitation | Admitting: Physical Medicine & Rehabilitation

## 2016-07-12 ENCOUNTER — Encounter: Payer: Self-pay | Admitting: Physical Medicine & Rehabilitation

## 2016-07-12 VITALS — BP 129/77 | HR 69 | Resp 14

## 2016-07-12 DIAGNOSIS — G479 Sleep disorder, unspecified: Secondary | ICD-10-CM

## 2016-07-12 DIAGNOSIS — I10 Essential (primary) hypertension: Secondary | ICD-10-CM | POA: Diagnosis not present

## 2016-07-12 DIAGNOSIS — M545 Low back pain: Secondary | ICD-10-CM | POA: Insufficient documentation

## 2016-07-12 DIAGNOSIS — E039 Hypothyroidism, unspecified: Secondary | ICD-10-CM | POA: Diagnosis not present

## 2016-07-12 DIAGNOSIS — Z833 Family history of diabetes mellitus: Secondary | ICD-10-CM | POA: Insufficient documentation

## 2016-07-12 DIAGNOSIS — R569 Unspecified convulsions: Secondary | ICD-10-CM | POA: Insufficient documentation

## 2016-07-12 DIAGNOSIS — Z86711 Personal history of pulmonary embolism: Secondary | ICD-10-CM | POA: Diagnosis not present

## 2016-07-12 DIAGNOSIS — D649 Anemia, unspecified: Secondary | ICD-10-CM | POA: Diagnosis not present

## 2016-07-12 DIAGNOSIS — K219 Gastro-esophageal reflux disease without esophagitis: Secondary | ICD-10-CM | POA: Diagnosis not present

## 2016-07-12 DIAGNOSIS — E785 Hyperlipidemia, unspecified: Secondary | ICD-10-CM | POA: Diagnosis not present

## 2016-07-12 DIAGNOSIS — Z96612 Presence of left artificial shoulder joint: Secondary | ICD-10-CM | POA: Diagnosis not present

## 2016-07-12 DIAGNOSIS — R269 Unspecified abnormalities of gait and mobility: Secondary | ICD-10-CM

## 2016-07-12 DIAGNOSIS — E1140 Type 2 diabetes mellitus with diabetic neuropathy, unspecified: Secondary | ICD-10-CM | POA: Insufficient documentation

## 2016-07-12 DIAGNOSIS — G894 Chronic pain syndrome: Secondary | ICD-10-CM

## 2016-07-12 DIAGNOSIS — M5442 Lumbago with sciatica, left side: Secondary | ICD-10-CM

## 2016-07-12 DIAGNOSIS — Q600 Renal agenesis, unilateral: Secondary | ICD-10-CM | POA: Diagnosis not present

## 2016-07-12 DIAGNOSIS — M48062 Spinal stenosis, lumbar region with neurogenic claudication: Secondary | ICD-10-CM | POA: Insufficient documentation

## 2016-07-12 DIAGNOSIS — Z8673 Personal history of transient ischemic attack (TIA), and cerebral infarction without residual deficits: Secondary | ICD-10-CM | POA: Insufficient documentation

## 2016-07-12 DIAGNOSIS — Z8249 Family history of ischemic heart disease and other diseases of the circulatory system: Secondary | ICD-10-CM | POA: Insufficient documentation

## 2016-07-12 DIAGNOSIS — Z9889 Other specified postprocedural states: Secondary | ICD-10-CM | POA: Diagnosis not present

## 2016-07-12 DIAGNOSIS — Z96653 Presence of artificial knee joint, bilateral: Secondary | ICD-10-CM | POA: Diagnosis not present

## 2016-07-12 DIAGNOSIS — Z9049 Acquired absence of other specified parts of digestive tract: Secondary | ICD-10-CM | POA: Insufficient documentation

## 2016-07-12 DIAGNOSIS — E1142 Type 2 diabetes mellitus with diabetic polyneuropathy: Secondary | ICD-10-CM | POA: Diagnosis not present

## 2016-07-12 DIAGNOSIS — M4156 Other secondary scoliosis, lumbar region: Secondary | ICD-10-CM | POA: Insufficient documentation

## 2016-07-12 DIAGNOSIS — Z955 Presence of coronary angioplasty implant and graft: Secondary | ICD-10-CM | POA: Insufficient documentation

## 2016-07-12 DIAGNOSIS — G8929 Other chronic pain: Secondary | ICD-10-CM

## 2016-07-12 DIAGNOSIS — M19012 Primary osteoarthritis, left shoulder: Secondary | ICD-10-CM | POA: Insufficient documentation

## 2016-07-12 DIAGNOSIS — K589 Irritable bowel syndrome without diarrhea: Secondary | ICD-10-CM | POA: Insufficient documentation

## 2016-07-12 DIAGNOSIS — M48061 Spinal stenosis, lumbar region without neurogenic claudication: Secondary | ICD-10-CM | POA: Diagnosis not present

## 2016-07-12 MED ORDER — METHOCARBAMOL 500 MG PO TABS: 500.0000 mg | ORAL_TABLET | Freq: Two times a day (BID) | ORAL | 1 refills | Status: DC | PRN

## 2016-07-12 MED ORDER — DICLOFENAC SODIUM 1 % TD GEL: 2.0000 g | Freq: Four times a day (QID) | TRANSDERMAL | 1 refills | Status: DC

## 2016-07-12 MED ORDER — GABAPENTIN 100 MG PO CAPS: 200.0000 mg | ORAL_CAPSULE | Freq: Three times a day (TID) | ORAL | 1 refills | Status: DC

## 2016-07-12 MED ORDER — GABAPENTIN 100 MG PO CAPS
200.0000 mg | ORAL_CAPSULE | Freq: Three times a day (TID) | ORAL | 1 refills | Status: DC
Start: 2016-07-12 — End: 2016-07-12

## 2016-07-12 NOTE — Progress Notes (Signed)
Subjective:    Patient ID: Hannah Fischer, female    DOB: 11/09/1932, 83 y.o.   MRN: [id number]  HPI 83 y/o female with pmh/psh of seizures, PE, DM with neuropathy, DJD, solitary kidney, TIA, left shoulder OA, lumbar spinal stensosis, b/l shoulder surgery presents with low back pain. Started ~2012.   Denies inciting event.  Stable, but exacerbated after fall.  Meds improve the pain.  Standing, ambulation exacerbate the pain.  Achy pain.  Constant.  Radiates at times to left calf.  Has not tried anything.  Associated LLE weakness, numbness, tingling.  Pain prevents from doing ADLs.  2-3 falls in the last year.  She was prescribed Xanax for sleep by PCP.  Pain Inventory Average Pain 8 Pain Right Now 3 My pain is dull  In the last 24 hours, has pain interfered with the following? General activity 7 Relation with others 0 Enjoyment of life 7 What TIME of day is your pain at its worst? daytime Sleep (in general) Poor  Pain is worse with: standing Pain improves with: medication Relief from Meds: 10  Mobility walk with assistance use a cane ability to climb steps?  yes do you drive?  yes transfers alone  Function retired  Neuro/Psych bowel control problems weakness dizziness  Prior Studies hospital f/u  Physicians involved in your care hospital f/u   Family History  Problem Relation Age of Onset  . Diabetes Mother   . Heart disease Mother   . Heart disease Sister   . Heart disease Brother    Social History   Social History  . Marital status: Married    Spouse name: N/A  . Number of children: N/A  . Years of education: N/A   Social History Main Topics  . Smoking status: Never Smoker  . Smokeless tobacco: Never Used  . Alcohol use No  . Drug use: No  . Sexual activity: No   Other Topics Concern  . None   Social History Narrative  . None   Past Surgical History:  Procedure Laterality Date  . BACK SURGERY    . BLEPHAROPLASTY  2012   rt eye  .  CARDIAC CATHETERIZATION     had one 10/2013  . CHOLECYSTECTOMY    . EYE SURGERY  2007   cataract ext/ iol rt eye  . JOINT REPLACEMENT  2001,2005   both knees  . LEFT HEART CATHETERIZATION WITH CORONARY ANGIOGRAM N/A 10/18/2013   Procedure: LEFT HEART CATHETERIZATION WITH CORONARY ANGIOGRAM;  Surgeon: Corky CraftsJayadeep S Varanasi, MD;  [facility];  Service: Cardiovascular;  Laterality: N/A;  . LUMBAR DISC SURGERY  1990  . REVERSE SHOULDER ARTHROPLASTY Left 12/03/2013   Procedure: LEFT SHOULDER REVERSE ARTHROPLASTY;  Surgeon: Verlee RossettiSteven R Norris, MD;  [facility];  Service: Orthopedics;  Laterality: Left;  . SHOULDER HEMI-ARTHROPLASTY  12/25/2010   Procedure: SHOULDER HEMI-ARTHROPLASTY;  Surgeon: Cammy CopaGregory Scott Dean;  [facility];  Service: Orthopedics;  Laterality: Right;   Past Medical History:  Diagnosis Date  . Abnormal nuclear stress test    2005, normal cath Dr. Elease HashimotoNahser, normal stress test 2012 Dr. Eldridge DaceVaranasi  . Anemia    chronic  . Anxiety   . Arthritis    djd  . Blood transfusion 1979   for anemia  . Chest pain    [facility]; had a stress test 2012  . Complication of anesthesia    unable to breathe lying  flat on back  . Diabetes mellitus  type 2  niddm x 25 yrs  . Diverticular disease   . DJD (degenerative joint disease)    lumbar and cervical  . GERD (gastroesophageal reflux disease)   . Hyperlipemia   . Hypertension    on medication  . Hypothyroidism    takes levoxyl  . Irritable bowel syndrome   . 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 Neuromuscular disorder (HCC)   . Neuropathy in diabetes (HCC)   . Other and unspecified angina pectoris    tests came back NOT heart related  . Pulmonary embolism (HCC) 06-2010   bilateral  . Seizures (HCC)    once, age 57   BP 129/77 (BP Location: Left Arm, Patient Position: Sitting, Cuff Size: Normal)   Pulse 69   Resp 14   SpO2 94%   Opioid Risk Score:   Fall Risk Score:   `1  Depression screen PHQ 2/9  No flowsheet data found.  Review of Systems  Constitutional: Positive for diaphoresis.  HENT: Negative.   Respiratory: Negative.   Cardiovascular: Negative.   Gastrointestinal: Positive for diarrhea.  Endocrine:       High blood sugar  Musculoskeletal: Positive for arthralgias, back pain and gait problem.  Skin: Negative.   Allergic/Immunologic: Negative.   Neurological: Positive for dizziness and weakness.  Hematological: Negative.   Psychiatric/Behavioral: Negative.   All other systems reviewed and are negative.     Objective:   Physical Exam Gen: NAD. Vital signs reviewed HENT: Normocephalic, Atraumatic Eyes: EOMI. No discharge.  Cardio: RRR. No JVD. Pulm: B/l clear to auscultation.  Effort normal Abd: Soft, BS+ MSK:  Gait mildly antalgic.   No TTP.    No edema.  Neuro:   Sensation intact to light touch in LE dermatomes  Reflexes 2+ throughout LE  Strength  4/5 b/l LE HF, 4+/5 KE, 5/5 ADF/PF  SLR neg b/l Skin: Warm and Dry. Intact    Assessment & Plan:  83 y/o female with pmh/psh of seizures, PE, DM with neuropathy, DJD, solitary kidney, TIA, left shoulder OA, lumbar spinal stensosis, b/l shoulder surgery presents with low back pain  1. Chronic mechanical low back pain  MRI reviewed, showing spinal stenosis and spondylosis in 2015, L-spine xray 02/2016 report reviewed, appears to show progression.   Will obtain procedure history from [facility]  Will try to avoid NSAIDs due to solitary kidney  Labs reviewed  Referral information reviewed  NCCSRS reviewed  UDS performed  Trial Heat/Cold  Will order trails TENS IT  Will order Bracing for periods of excessive activity  Will order Voltaren gel  Will order Robaxin 500mg  BID PRN  Will increase Gabapentin to 200 TID  Will order MRI of L-spine  Will consider Cymbalta  Will consider PT in future  Will attempt to avoid narcotics   2. Gait abnormality  Will order cane for  safety  3. Sleep disturbance  Will d/c xanax  Will order low dose trazodone  4. Diabetic neuropathy  Confounding picture   See #1

## 2016-07-16 ENCOUNTER — Telehealth (INDEPENDENT_AMBULATORY_CARE_PROVIDER_SITE_OTHER): Payer: Self-pay | Admitting: Specialist

## 2016-07-16 NOTE — Telephone Encounter (Signed)
Patient called asked if she can use Diclofenac cream that she got from the pain clinic. The number to contact patient is [phone]

## 2016-07-16 NOTE — Telephone Encounter (Signed)
Patient called asked if she can use Diclofenac cream that she got from the pain clinic.----She is concerned due to only having one kidney

## 2016-07-17 NOTE — Telephone Encounter (Signed)
I called and spoke to Mrs. Resch about the diclofenac cream, she had called earlier asking advice as to the use of  This cream for leg and back pain. She has a history of TIAs, in on plavix and a history of a single kidney. I told her that she is of increased risk of having stroke or cardiac event with this medication. The risks are less than oral or IV use of the diclofenac but there is still some risk elevation. Also as she has only one kidney the medication may place a Small amount of burden on the kidney and she needs to be careful with NSAIDs in general. I did recommend she consider capsaicin cream HC OTC as an alternative.

## 2016-07-22 ENCOUNTER — Ambulatory Visit (HOSPITAL_COMMUNITY)
Admission: RE | Admit: 2016-07-22 | Discharge: 2016-07-22 | Disposition: A | Discharge status: Home / Self Care | Payer: Medicare Other | Source: Ambulatory Visit | Attending: Physical Medicine & Rehabilitation | Admitting: Physical Medicine & Rehabilitation

## 2016-07-22 DIAGNOSIS — M47816 Spondylosis without myelopathy or radiculopathy, lumbar region: Secondary | ICD-10-CM | POA: Insufficient documentation

## 2016-07-22 DIAGNOSIS — G8929 Other chronic pain: Secondary | ICD-10-CM | POA: Diagnosis not present

## 2016-07-22 DIAGNOSIS — M48061 Spinal stenosis, lumbar region without neurogenic claudication: Secondary | ICD-10-CM | POA: Diagnosis not present

## 2016-07-22 DIAGNOSIS — M5124 Other intervertebral disc displacement, thoracic region: Secondary | ICD-10-CM | POA: Diagnosis not present

## 2016-07-22 DIAGNOSIS — M5136 Other intervertebral disc degeneration, lumbar region: Secondary | ICD-10-CM | POA: Diagnosis not present

## 2016-07-22 DIAGNOSIS — M4316 Spondylolisthesis, lumbar region: Secondary | ICD-10-CM | POA: Diagnosis not present

## 2016-07-22 DIAGNOSIS — M4186 Other forms of scoliosis, lumbar region: Secondary | ICD-10-CM | POA: Insufficient documentation

## 2016-07-22 DIAGNOSIS — M5442 Lumbago with sciatica, left side: Secondary | ICD-10-CM | POA: Insufficient documentation

## 2016-07-22 DIAGNOSIS — G894 Chronic pain syndrome: Secondary | ICD-10-CM

## 2016-07-22 LAB — POCT I-STAT CREATININE: Creatinine, Ser: 0.9 mg/dL (ref 0.44–1.00)

## 2016-07-22 MED ORDER — GADOBENATE DIMEGLUMINE 529 MG/ML IV SOLN
20.0000 mL | Freq: Once | INTRAVENOUS | Status: AC | PRN
  Administered 2016-07-22: 17 mL via INTRAVENOUS

## 2016-08-09 ENCOUNTER — Encounter: Payer: Medicare Other | Admitting: Physical Medicine & Rehabilitation

## 2016-08-29 ENCOUNTER — Encounter: Payer: Self-pay | Admitting: Physical Medicine & Rehabilitation

## 2016-08-29 ENCOUNTER — Encounter: Payer: Medicare Other | Attending: Physical Medicine & Rehabilitation | Admitting: Physical Medicine & Rehabilitation

## 2016-08-29 ENCOUNTER — Other Ambulatory Visit: Payer: Self-pay | Admitting: Physical Medicine & Rehabilitation

## 2016-08-29 VITALS — BP 158/79 | HR 65 | Resp 14

## 2016-08-29 DIAGNOSIS — Z8673 Personal history of transient ischemic attack (TIA), and cerebral infarction without residual deficits: Secondary | ICD-10-CM | POA: Diagnosis not present

## 2016-08-29 DIAGNOSIS — Z8249 Family history of ischemic heart disease and other diseases of the circulatory system: Secondary | ICD-10-CM | POA: Insufficient documentation

## 2016-08-29 DIAGNOSIS — G479 Sleep disorder, unspecified: Secondary | ICD-10-CM | POA: Diagnosis not present

## 2016-08-29 DIAGNOSIS — Z833 Family history of diabetes mellitus: Secondary | ICD-10-CM | POA: Diagnosis not present

## 2016-08-29 DIAGNOSIS — E785 Hyperlipidemia, unspecified: Secondary | ICD-10-CM | POA: Diagnosis not present

## 2016-08-29 DIAGNOSIS — Z9049 Acquired absence of other specified parts of digestive tract: Secondary | ICD-10-CM | POA: Insufficient documentation

## 2016-08-29 DIAGNOSIS — M48061 Spinal stenosis, lumbar region without neurogenic claudication: Secondary | ICD-10-CM | POA: Diagnosis not present

## 2016-08-29 DIAGNOSIS — K219 Gastro-esophageal reflux disease without esophagitis: Secondary | ICD-10-CM | POA: Diagnosis not present

## 2016-08-29 DIAGNOSIS — M545 Low back pain: Secondary | ICD-10-CM | POA: Diagnosis not present

## 2016-08-29 DIAGNOSIS — E1140 Type 2 diabetes mellitus with diabetic neuropathy, unspecified: Secondary | ICD-10-CM | POA: Insufficient documentation

## 2016-08-29 DIAGNOSIS — M791 Myalgia, unspecified site: Secondary | ICD-10-CM

## 2016-08-29 DIAGNOSIS — Z96653 Presence of artificial knee joint, bilateral: Secondary | ICD-10-CM | POA: Diagnosis not present

## 2016-08-29 DIAGNOSIS — E039 Hypothyroidism, unspecified: Secondary | ICD-10-CM | POA: Diagnosis not present

## 2016-08-29 DIAGNOSIS — Q600 Renal agenesis, unilateral: Secondary | ICD-10-CM | POA: Diagnosis not present

## 2016-08-29 DIAGNOSIS — Z955 Presence of coronary angioplasty implant and graft: Secondary | ICD-10-CM | POA: Insufficient documentation

## 2016-08-29 DIAGNOSIS — G894 Chronic pain syndrome: Secondary | ICD-10-CM

## 2016-08-29 DIAGNOSIS — I10 Essential (primary) hypertension: Secondary | ICD-10-CM | POA: Insufficient documentation

## 2016-08-29 DIAGNOSIS — Z86711 Personal history of pulmonary embolism: Secondary | ICD-10-CM | POA: Diagnosis not present

## 2016-08-29 DIAGNOSIS — Z9889 Other specified postprocedural states: Secondary | ICD-10-CM | POA: Insufficient documentation

## 2016-08-29 DIAGNOSIS — Z96612 Presence of left artificial shoulder joint: Secondary | ICD-10-CM | POA: Diagnosis not present

## 2016-08-29 DIAGNOSIS — K589 Irritable bowel syndrome without diarrhea: Secondary | ICD-10-CM | POA: Insufficient documentation

## 2016-08-29 DIAGNOSIS — M5442 Lumbago with sciatica, left side: Secondary | ICD-10-CM

## 2016-08-29 DIAGNOSIS — M19012 Primary osteoarthritis, left shoulder: Secondary | ICD-10-CM | POA: Insufficient documentation

## 2016-08-29 DIAGNOSIS — D649 Anemia, unspecified: Secondary | ICD-10-CM | POA: Insufficient documentation

## 2016-08-29 DIAGNOSIS — G8929 Other chronic pain: Secondary | ICD-10-CM

## 2016-08-29 DIAGNOSIS — E1142 Type 2 diabetes mellitus with diabetic polyneuropathy: Secondary | ICD-10-CM | POA: Diagnosis not present

## 2016-08-29 DIAGNOSIS — R569 Unspecified convulsions: Secondary | ICD-10-CM | POA: Insufficient documentation

## 2016-08-29 DIAGNOSIS — R269 Unspecified abnormalities of gait and mobility: Secondary | ICD-10-CM | POA: Diagnosis not present

## 2016-08-29 MED ORDER — GABAPENTIN 300 MG PO CAPS: 300.0000 mg | ORAL_CAPSULE | Freq: Three times a day (TID) | ORAL | 1 refills | Status: DC

## 2016-08-29 MED ORDER — TRAZODONE HCL 50 MG PO TABS: 25.0000 mg | ORAL_TABLET | Freq: Every day | ORAL | 1 refills | Status: DC

## 2016-08-29 MED ORDER — METHOCARBAMOL 500 MG PO TABS: 500.0000 mg | ORAL_TABLET | Freq: Three times a day (TID) | ORAL | 1 refills | Status: DC | PRN

## 2016-08-29 NOTE — Progress Notes (Signed)
Subjective:    Patient ID: Hannah Fischer, female    DOB: 03/27/1932, 83 y.o.   MRN: [id number]  HPI 83 y/o female with pmh/psh of seizures, PE, DM with neuropathy, DJD, solitary kidney, TIA, left shoulder OA, lumbar spinal stensosis, b/l shoulder surgery presents with low back pain.  Initially stated: Started ~2012.   Denies inciting event.  Stable, but exacerbated after fall.  Meds improve the pain.  Standing, ambulation exacerbate the pain.  Achy pain.  Constant.  Radiates at times to left calf.  Has not tried anything.  Associated LLE weakness, numbness, tingling.  Pain prevents from doing ADLs.  2-3 falls in the last year.  She was prescribed Xanax for sleep by PCP.  Last clinic visit 07/12/16.  Since that time, she tried heat without benefit.  She states she was never contacted about TENS IT.  She states her brace is supposed to come in today.  She states she never obtained Voltaren gel because she was told by Dr. Otelia SergeantNitka he was afraid with her solitary kidney.  Robaxin helps.  The increase in Gabapentin also helps.  She did obtain the MRI of her spine.  She is using the cane with benefit.  She had one mechanical fall, hitting a rock.  She did not obtain trazodone. Overall, she states she is about the same.   Pain Inventory Average Pain 8 Pain Right Now 5 My pain is dull  In the last 24 hours, has pain interfered with the following? General activity 7 Relation with others 0 Enjoyment of life 7 What TIME of day is your pain at its worst? daytime Sleep (in general) Poor  Pain is worse with: standing Pain improves with: medication Relief from Meds: 10  Mobility walk with assistance use a cane ability to climb steps?  no do you drive?  yes transfers alone  Function retired  Neuro/Psych bladder control problems bowel control problems  Prior Studies Any changes since last visit?  no  Physicians involved in your care Any changes since last visit?  no   Family History    Problem Relation Age of Onset  . Diabetes Mother   . Heart disease Mother   . Heart disease Sister   . Heart disease Brother    Social History   Social History  . Marital status: Married    Spouse name: N/A  . Number of children: N/A  . Years of education: N/A   Social History Main Topics  . Smoking status: Never Smoker  . Smokeless tobacco: Never Used  . Alcohol use No  . Drug use: No  . Sexual activity: No   Other Topics Concern  . None   Social History Narrative  . None   Past Surgical History:  Procedure Laterality Date  . BACK SURGERY    . BLEPHAROPLASTY  2012   rt eye  . CARDIAC CATHETERIZATION     had one 10/2013  . CHOLECYSTECTOMY    . EYE SURGERY  2007   cataract ext/ iol rt eye  . JOINT REPLACEMENT  2001,2005   both knees  . LEFT HEART CATHETERIZATION WITH CORONARY ANGIOGRAM N/A 10/18/2013   Procedure: LEFT HEART CATHETERIZATION WITH CORONARY ANGIOGRAM;  Surgeon: Corky CraftsJayadeep S Varanasi, MD;  [facility];  Service: Cardiovascular;  Laterality: N/A;  . LUMBAR DISC SURGERY  1990  . REVERSE SHOULDER ARTHROPLASTY Left 12/03/2013   Procedure: LEFT SHOULDER REVERSE ARTHROPLASTY;  Surgeon: Verlee RossettiSteven R Norris, MD;  [facility] OR;  Service: Orthopedics;  Laterality: Left;  . SHOULDER HEMI-ARTHROPLASTY  12/25/2010   Procedure: SHOULDER HEMI-ARTHROPLASTY;  Surgeon: Cammy Copa;  [facility];  Service: Orthopedics;  Laterality: Right;   Past Medical History:  Diagnosis Date  . Abnormal nuclear stress test    2005, normal cath Dr. Elease Hashimoto, normal stress test 2012 Dr. Eldridge Dace  . Anemia    chronic  . Anxiety   . Arthritis    djd  . Blood transfusion 1979   for anemia  . Chest pain    evaluated @ [facility]ology; had a stress test 2012  . Complication of anesthesia    unable to breathe lying  flat on back  . Diabetes mellitus    type 2  niddm x 25 yrs  . Diverticular disease   . DJD (degenerative joint disease)    lumbar and cervical  .  GERD (gastroesophageal reflux disease)   . Hyperlipemia   . Hypertension    on medication  . Hypothyroidism    takes levoxyl  . Irritable bowel syndrome   . 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 Neuromuscular disorder (HCC)   . Neuropathy in diabetes (HCC)   . Other and unspecified angina pectoris    tests came back NOT heart related  . Pulmonary embolism (HCC) 06-2010   bilateral  . Seizures (HCC)    once, age 89   BP (!) 158/79 (BP Location: Left Arm, Patient Position: Sitting, Cuff Size: Normal)   Pulse 65   Resp 14   SpO2 92%   Opioid Risk Score:   Fall Risk Score:  `1  Depression screen PHQ 2/9  No flowsheet data found.  Review of Systems  Constitutional: Positive for diaphoresis.  HENT: Negative.   Respiratory: Negative.   Cardiovascular: Negative.   Gastrointestinal: Positive for diarrhea.  Endocrine:       High blood sugar  Genitourinary: Positive for difficulty urinating.  Musculoskeletal: Positive for arthralgias, back pain and gait problem.  Skin: Negative.   Allergic/Immunologic: Negative.   Neurological: Positive for dizziness and weakness.  Hematological: Negative.   Psychiatric/Behavioral: Negative.   All other systems reviewed and are negative.     Objective:   Physical Exam Gen: NAD. Vital signs reviewed HENT: Normocephalic, Atraumatic Eyes: EOMI. No discharge.  Cardio: RRR. No JVD. Pulm: B/l clear to auscultation.  Effort normal Abd: Soft, BS+ MSK:  Gait mildly antalgic.   TTP along lumar PSPs.    No edema.  Neuro:   Strength  4/5 b/l LE HF, 4+/5 KE, 5/5 ADF/PF  SLR neg b/l Skin: Warm and Dry. Intact    Assessment & Plan:  83 y/o female with pmh/psh of seizures, PE, DM with neuropathy, DJD, solitary kidney, TIA, left shoulder OA, lumbar spinal stensosis, b/l shoulder surgery presents with low back pain  1. Chronic mechanical low back pain  MRI 07/2016 reviewed, showing L4-5 stenosis with facet arthropathy.    Pt states  she has had ESIs without benefit  Will try to avoid NSAIDs due to solitary kidney  Pt told not to use Voltaren gel by Neurosurg  Cont Heat  Orrdered TENS IT, pt states it has been effective in the past, but did not receive a call regarding the unit  Pt to pick up Bracing for periods of excessive activity today  Will increase Robaxin 500mg  TID PRN  Will increase Gabapentin to 300 TID  Will order aquatic therapy  Cymbalta 20 ordered by PCP, will  consider increase in future  Will attempt to avoid narcotics   2. Gait abnormality  Cont cane for safety  3. Sleep disturbance  D/ced xanax  Low dose trazodone ordered, pt states she never received  4. Diabetic neuropathy  Confounding picture   See #1  5. Myalgias  Will schedule for trigger point injections on next visit

## 2016-08-30 DIAGNOSIS — M545 Low back pain: Secondary | ICD-10-CM | POA: Diagnosis not present

## 2016-09-02 ENCOUNTER — Other Ambulatory Visit: Payer: Self-pay | Admitting: Physical Medicine & Rehabilitation

## 2016-09-11 DIAGNOSIS — Z1211 Encounter for screening for malignant neoplasm of colon: Secondary | ICD-10-CM | POA: Diagnosis not present

## 2016-09-30 DIAGNOSIS — M25551 Pain in right hip: Secondary | ICD-10-CM | POA: Diagnosis not present

## 2016-09-30 DIAGNOSIS — M25552 Pain in left hip: Secondary | ICD-10-CM | POA: Diagnosis not present

## 2016-09-30 DIAGNOSIS — M545 Low back pain: Secondary | ICD-10-CM | POA: Diagnosis not present

## 2016-09-30 DIAGNOSIS — R262 Difficulty in walking, not elsewhere classified: Secondary | ICD-10-CM | POA: Diagnosis not present

## 2016-10-04 ENCOUNTER — Encounter: Payer: Self-pay | Admitting: Physical Medicine & Rehabilitation

## 2016-10-04 ENCOUNTER — Encounter: Payer: Medicare Other | Attending: Physical Medicine & Rehabilitation | Admitting: Physical Medicine & Rehabilitation

## 2016-10-04 ENCOUNTER — Other Ambulatory Visit: Payer: Self-pay | Admitting: Physical Medicine & Rehabilitation

## 2016-10-04 VITALS — BP 114/62 | HR 52

## 2016-10-04 DIAGNOSIS — K589 Irritable bowel syndrome without diarrhea: Secondary | ICD-10-CM | POA: Insufficient documentation

## 2016-10-04 DIAGNOSIS — R569 Unspecified convulsions: Secondary | ICD-10-CM | POA: Diagnosis present

## 2016-10-04 DIAGNOSIS — Z96653 Presence of artificial knee joint, bilateral: Secondary | ICD-10-CM | POA: Diagnosis not present

## 2016-10-04 DIAGNOSIS — M19012 Primary osteoarthritis, left shoulder: Secondary | ICD-10-CM | POA: Insufficient documentation

## 2016-10-04 DIAGNOSIS — Z86711 Personal history of pulmonary embolism: Secondary | ICD-10-CM | POA: Insufficient documentation

## 2016-10-04 DIAGNOSIS — M791 Myalgia, unspecified site: Secondary | ICD-10-CM

## 2016-10-04 DIAGNOSIS — G894 Chronic pain syndrome: Secondary | ICD-10-CM

## 2016-10-04 DIAGNOSIS — G479 Sleep disorder, unspecified: Secondary | ICD-10-CM

## 2016-10-04 DIAGNOSIS — E1140 Type 2 diabetes mellitus with diabetic neuropathy, unspecified: Secondary | ICD-10-CM | POA: Diagnosis not present

## 2016-10-04 DIAGNOSIS — Z9049 Acquired absence of other specified parts of digestive tract: Secondary | ICD-10-CM | POA: Diagnosis not present

## 2016-10-04 DIAGNOSIS — R269 Unspecified abnormalities of gait and mobility: Secondary | ICD-10-CM

## 2016-10-04 DIAGNOSIS — Q600 Renal agenesis, unilateral: Secondary | ICD-10-CM | POA: Insufficient documentation

## 2016-10-04 DIAGNOSIS — Z96612 Presence of left artificial shoulder joint: Secondary | ICD-10-CM | POA: Insufficient documentation

## 2016-10-04 DIAGNOSIS — Z955 Presence of coronary angioplasty implant and graft: Secondary | ICD-10-CM | POA: Insufficient documentation

## 2016-10-04 DIAGNOSIS — Z8249 Family history of ischemic heart disease and other diseases of the circulatory system: Secondary | ICD-10-CM | POA: Insufficient documentation

## 2016-10-04 DIAGNOSIS — I10 Essential (primary) hypertension: Secondary | ICD-10-CM | POA: Insufficient documentation

## 2016-10-04 DIAGNOSIS — Z833 Family history of diabetes mellitus: Secondary | ICD-10-CM | POA: Diagnosis not present

## 2016-10-04 DIAGNOSIS — E1142 Type 2 diabetes mellitus with diabetic polyneuropathy: Secondary | ICD-10-CM

## 2016-10-04 DIAGNOSIS — M48061 Spinal stenosis, lumbar region without neurogenic claudication: Secondary | ICD-10-CM | POA: Insufficient documentation

## 2016-10-04 DIAGNOSIS — K219 Gastro-esophageal reflux disease without esophagitis: Secondary | ICD-10-CM | POA: Insufficient documentation

## 2016-10-04 DIAGNOSIS — E039 Hypothyroidism, unspecified: Secondary | ICD-10-CM | POA: Insufficient documentation

## 2016-10-04 DIAGNOSIS — D649 Anemia, unspecified: Secondary | ICD-10-CM | POA: Diagnosis not present

## 2016-10-04 DIAGNOSIS — M545 Low back pain: Secondary | ICD-10-CM | POA: Diagnosis not present

## 2016-10-04 DIAGNOSIS — Z8673 Personal history of transient ischemic attack (TIA), and cerebral infarction without residual deficits: Secondary | ICD-10-CM | POA: Diagnosis not present

## 2016-10-04 DIAGNOSIS — Z9889 Other specified postprocedural states: Secondary | ICD-10-CM | POA: Insufficient documentation

## 2016-10-04 DIAGNOSIS — G8929 Other chronic pain: Secondary | ICD-10-CM

## 2016-10-04 DIAGNOSIS — M5442 Lumbago with sciatica, left side: Secondary | ICD-10-CM | POA: Diagnosis not present

## 2016-10-04 DIAGNOSIS — E785 Hyperlipidemia, unspecified: Secondary | ICD-10-CM | POA: Insufficient documentation

## 2016-10-04 MED ORDER — DULOXETINE HCL 30 MG PO CPEP: 30.0000 mg | ORAL_CAPSULE | Freq: Every day | ORAL | 1 refills | Status: DC

## 2016-10-04 MED ORDER — TRAZODONE HCL 50 MG PO TABS
50.0000 mg | ORAL_TABLET | Freq: Every day | ORAL | 1 refills | Status: DC
Start: 2016-10-04 — End: 2016-10-05

## 2016-10-04 NOTE — Progress Notes (Signed)
Subjective:    Patient ID: Hannah Fischer, female    DOB: 30-Jun-1932, 83 y.o.   MRN: [id number]  HPI 83 y/o female with pmh/psh of seizures, PE, DM with neuropathy, DJD, solitary kidney, TIA, left shoulder OA, lumbar spinal stensosis, b/l shoulder surgery presents for follow up for low back pain.  Initially stated: Started ~2012.   Denies inciting event.  Stable, but exacerbated after fall.  Meds improve the pain.  Standing, ambulation exacerbate the pain.  Achy pain.  Constant.  Radiates at times to left calf.  Has not tried anything.  Associated LLE weakness, numbness, tingling.  Pain prevents from doing ADLs.  2-3 falls in the last year.  She was prescribed Xanax for sleep by PCP.  Last clinic visit 08/29/16.  Since last visit, she has obtained a TENS unit, which is providing benefit.  The brace is helping her as well.  She notes improvement with increase in Robaxin and Gabapentin.  She went to pool therapy, but the copay was too expensive.  When informed about local pool, she states that could be an option.  Had a fall week, thinks she tripped on the threshold. Her sleep has improved.    Pain Inventory Average Pain 2 Pain Right Now 2 My pain is aching  In the last 24 hours, has pain interfered with the following? General activity 8 Relation with others 0 Enjoyment of life 7 What TIME of day is your pain at its worst? daytime Sleep (in general) Poor  Pain is worse with: standing Pain improves with: medication Relief from Meds: 10  Mobility walk with assistance use a cane ability to climb steps?  yes do you drive?  yes transfers alone  Function retired  Neuro/Psych bladder control problems bowel control problems  Prior Studies Any changes since last visit?  no  Physicians involved in your care Any changes since last visit?  no   Family History  Problem Relation Age of Onset  . Diabetes Mother   . Heart disease Mother   . Heart disease Sister   . Heart disease  Brother    Social History   Social History  . Marital status: Married    Spouse name: N/A  . Number of children: N/A  . Years of education: N/A   Social History Main Topics  . Smoking status: Never Smoker  . Smokeless tobacco: Never Used  . Alcohol use No  . Drug use: No  . Sexual activity: No   Other Topics Concern  . None   Social History Narrative  . None   Past Surgical History:  Procedure Laterality Date  . BACK SURGERY    . BLEPHAROPLASTY  2012   rt eye  . CARDIAC CATHETERIZATION     had one 10/2013  . CHOLECYSTECTOMY    . EYE SURGERY  2007   cataract ext/ iol rt eye  . JOINT REPLACEMENT  2001,2005   both knees  . LEFT HEART CATHETERIZATION WITH CORONARY ANGIOGRAM N/A 10/18/2013   Procedure: LEFT HEART CATHETERIZATION WITH CORONARY ANGIOGRAM;  Surgeon: Corky Crafts, MD;  Location: [facility] CATH LAB;  Service: Cardiovascular;  Laterality: N/A;  . LUMBAR DISC SURGERY  1990  . REVERSE SHOULDER ARTHROPLASTY Left 12/03/2013   Procedure: LEFT SHOULDER REVERSE ARTHROPLASTY;  Surgeon: Verlee Rossetti, MD;  [facility] OR;  Service: Orthopedics;  Laterality: Left;  . SHOULDER HEMI-ARTHROPLASTY  12/25/2010   Procedure: SHOULDER HEMI-ARTHROPLASTY;  Surgeon: Cammy Copa;  Location: [facility] OR;  Service:  Orthopedics;  Laterality: Right;   Past Medical History:  Diagnosis Date  . Abnormal nuclear stress test    2005, normal cath Dr. Elease Hashimoto, normal stress test 2012 Dr. Eldridge Dace  . Anemia    chronic  . Anxiety   . Arthritis    djd  . Blood transfusion 1979   for anemia  . Chest pain    eval[facility] cardiology; had a stress test 2012  . Complication of anesthesia    unable to breathe lying  flat on back  . Diabetes mellitus    type 2  niddm x 25 yrs  . Diverticular disease   . DJD (degenerative joint disease)    lumbar and cervical  . GERD (gastroesophageal reflux disease)   . Hyperlipemia   . Hypertension    on medication  . Hypothyroidism    takes  levoxyl  . Irritable bowel syndrome   . 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 Neuromuscular disorder (HCC)   . Neuropathy in diabetes (HCC)   . Other and unspecified angina pectoris    tests came back NOT heart related  . Pulmonary embolism (HCC) 06-2010   bilateral  . Seizures (HCC)    once, age 40   BP 114/62 (BP Location: Left Arm, Patient Position: Sitting, Cuff Size: Normal)   Pulse (!) 52   SpO2 96%   Opioid Risk Score:   Fall Risk Score:  `1  Depression screen PHQ 2/9  No flowsheet data found.  Review of Systems  HENT: Negative.   Respiratory: Negative.   Cardiovascular: Negative.   Gastrointestinal: Positive for diarrhea.  Endocrine:       High blood sugar  Genitourinary: Positive for difficulty urinating.  Musculoskeletal: Positive for arthralgias, back pain and gait problem.  Skin: Negative.   Allergic/Immunologic: Negative.   Hematological: Negative.   Psychiatric/Behavioral: Negative.   All other systems reviewed and are negative.     Objective:   Physical Exam Gen: NAD. Vital signs reviewed HENT: Normocephalic, Atraumatic Eyes: EOMI. No discharge.  Cardio: RRR. No JVD. Pulm: B/l clear to auscultation.  Effort normal Abd: Soft, BS+ MSK:  Gait mildly antalgic.   TTP along b/l lumar PSPs and gluteal muscles.    No edema.  Neuro:   Strength  4/5 b/l LE HF, 4+/5 KE, 5/5 ADF/PF Skin: Warm and Dry. Intact    Assessment & Plan:  83 y/o female with pmh/psh of seizures, PE, DM with neuropathy, DJD, solitary kidney, TIA, left shoulder OA, lumbar spinal stensosis, b/l shoulder surgery presents for follow up for low back pain  1. Chronic mechanical low back pain  MRI 07/2016 reviewed, showing L4-5 stenosis with facet arthropathy.    Pt states she has had ESIs without benefit  Will try to avoid NSAIDs due to solitary kidney  Pt told not to use Voltaren gel by Neurosurg  Cont Heat  Cont TENS IT, with benefit  Cont bracing for periods of  excessive activity   Cont Robaxin 500mg  TID PRN  Cont Gabapentin to 300 TID  Pt could not afford aquatic therapy after 1 visit, encouraged follow up at local pool  Cymbalta increased to 30  Will attempt to avoid narcotics  Will consider facet injections in future  2. Gait abnormality  Cont cane for safety  3. Sleep disturbance  D/ced xanax  Will increase trazodone to 50mg   4. Diabetic neuropathy  Confounding picture   See #1  5. Myalgias  Will consider  trigger point injections in future

## 2016-10-05 ENCOUNTER — Other Ambulatory Visit: Payer: Self-pay | Admitting: Physical Medicine & Rehabilitation

## 2016-10-09 DIAGNOSIS — E039 Hypothyroidism, unspecified: Secondary | ICD-10-CM | POA: Diagnosis not present

## 2016-10-09 DIAGNOSIS — I10 Essential (primary) hypertension: Secondary | ICD-10-CM | POA: Diagnosis not present

## 2016-10-09 DIAGNOSIS — Z0000 Encounter for general adult medical examination without abnormal findings: Secondary | ICD-10-CM | POA: Diagnosis not present

## 2016-10-09 DIAGNOSIS — G459 Transient cerebral ischemic attack, unspecified: Secondary | ICD-10-CM | POA: Diagnosis not present

## 2016-10-24 ENCOUNTER — Other Ambulatory Visit: Payer: Self-pay | Admitting: Physical Medicine & Rehabilitation

## 2016-11-14 ENCOUNTER — Encounter: Payer: Self-pay | Admitting: Physical Medicine & Rehabilitation

## 2016-11-14 ENCOUNTER — Encounter: Payer: Medicare Other | Attending: Physical Medicine & Rehabilitation | Admitting: Physical Medicine & Rehabilitation

## 2016-11-14 VITALS — BP 148/77 | HR 65

## 2016-11-14 DIAGNOSIS — R569 Unspecified convulsions: Secondary | ICD-10-CM | POA: Diagnosis present

## 2016-11-14 DIAGNOSIS — E1142 Type 2 diabetes mellitus with diabetic polyneuropathy: Secondary | ICD-10-CM | POA: Diagnosis not present

## 2016-11-14 DIAGNOSIS — K219 Gastro-esophageal reflux disease without esophagitis: Secondary | ICD-10-CM | POA: Insufficient documentation

## 2016-11-14 DIAGNOSIS — M791 Myalgia, unspecified site: Secondary | ICD-10-CM

## 2016-11-14 DIAGNOSIS — G8929 Other chronic pain: Secondary | ICD-10-CM

## 2016-11-14 DIAGNOSIS — Z833 Family history of diabetes mellitus: Secondary | ICD-10-CM | POA: Diagnosis not present

## 2016-11-14 DIAGNOSIS — Z96653 Presence of artificial knee joint, bilateral: Secondary | ICD-10-CM | POA: Insufficient documentation

## 2016-11-14 DIAGNOSIS — M19012 Primary osteoarthritis, left shoulder: Secondary | ICD-10-CM | POA: Diagnosis not present

## 2016-11-14 DIAGNOSIS — M545 Low back pain: Secondary | ICD-10-CM | POA: Diagnosis not present

## 2016-11-14 DIAGNOSIS — I10 Essential (primary) hypertension: Secondary | ICD-10-CM | POA: Diagnosis not present

## 2016-11-14 DIAGNOSIS — M5442 Lumbago with sciatica, left side: Secondary | ICD-10-CM | POA: Diagnosis not present

## 2016-11-14 DIAGNOSIS — Z8673 Personal history of transient ischemic attack (TIA), and cerebral infarction without residual deficits: Secondary | ICD-10-CM | POA: Diagnosis not present

## 2016-11-14 DIAGNOSIS — Z9049 Acquired absence of other specified parts of digestive tract: Secondary | ICD-10-CM | POA: Insufficient documentation

## 2016-11-14 DIAGNOSIS — Z8249 Family history of ischemic heart disease and other diseases of the circulatory system: Secondary | ICD-10-CM | POA: Diagnosis not present

## 2016-11-14 DIAGNOSIS — R269 Unspecified abnormalities of gait and mobility: Secondary | ICD-10-CM

## 2016-11-14 DIAGNOSIS — G894 Chronic pain syndrome: Secondary | ICD-10-CM

## 2016-11-14 DIAGNOSIS — G479 Sleep disorder, unspecified: Secondary | ICD-10-CM | POA: Diagnosis not present

## 2016-11-14 DIAGNOSIS — Z86711 Personal history of pulmonary embolism: Secondary | ICD-10-CM | POA: Insufficient documentation

## 2016-11-14 DIAGNOSIS — Q600 Renal agenesis, unilateral: Secondary | ICD-10-CM | POA: Insufficient documentation

## 2016-11-14 DIAGNOSIS — E1140 Type 2 diabetes mellitus with diabetic neuropathy, unspecified: Secondary | ICD-10-CM | POA: Insufficient documentation

## 2016-11-14 DIAGNOSIS — M48061 Spinal stenosis, lumbar region without neurogenic claudication: Secondary | ICD-10-CM | POA: Insufficient documentation

## 2016-11-14 DIAGNOSIS — K589 Irritable bowel syndrome without diarrhea: Secondary | ICD-10-CM | POA: Diagnosis not present

## 2016-11-14 DIAGNOSIS — Z96612 Presence of left artificial shoulder joint: Secondary | ICD-10-CM | POA: Diagnosis not present

## 2016-11-14 DIAGNOSIS — D649 Anemia, unspecified: Secondary | ICD-10-CM | POA: Insufficient documentation

## 2016-11-14 DIAGNOSIS — Z9889 Other specified postprocedural states: Secondary | ICD-10-CM | POA: Insufficient documentation

## 2016-11-14 DIAGNOSIS — Z955 Presence of coronary angioplasty implant and graft: Secondary | ICD-10-CM | POA: Diagnosis not present

## 2016-11-14 DIAGNOSIS — E785 Hyperlipidemia, unspecified: Secondary | ICD-10-CM | POA: Diagnosis not present

## 2016-11-14 DIAGNOSIS — E039 Hypothyroidism, unspecified: Secondary | ICD-10-CM | POA: Insufficient documentation

## 2016-11-14 NOTE — Progress Notes (Signed)
Subjective:    Patient ID: Hannah Fischer, female    DOB: 07/27/1932, 83 y.o.   MRN: [id number]  HPI 83 y/o female with pmh/psh of seizures, PE, DM with neuropathy, DJD, solitary kidney, TIA, left shoulder OA, lumbar spinal stensosis, b/l shoulder surgery presents for follow up for low back pain.  Initially stated: Started ~2012.   Denies inciting event.  Stable, but exacerbated after fall.  Meds improve the pain.  Standing, ambulation exacerbate the pain.  Achy pain.  Constant.  Radiates at times to left calf.  Has not tried anything.  Associated LLE weakness, numbness, tingling.  Pain prevents from doing ADLs.  2-3 falls in the last year.  She was prescribed Xanax for sleep by PCP.  Last clinic visit 10/04/16.  Since last visit, she has not gone for pool therapy, but plans to start.  She tolerated increase in Cymbalta. She is tolerating Gabapentin.  Sleeping better with Trazodone. She states she had 2 mechanical falls, tripping over the vacuum cleaner and a towel.  Overall she states she is doing much better.    Pain Inventory Average Pain 0 Pain Right Now 0 My pain is aching and na  In the last 24 hours, has pain interfered with the following? General activity 0 Relation with others 0 Enjoyment of life 0 What TIME of day is your pain at its worst? na Sleep (in general) Fair  Pain is worse with: na Pain improves with: na Relief from Meds: na  Mobility walk with assistance use a cane ability to climb steps?  yes do you drive?  yes transfers alone  Function retired  Neuro/Psych bladder control problems bowel control problems  Prior Studies Any changes since last visit?  no  Physicians involved in your care Any changes since last visit?  no   Family History  Problem Relation Age of Onset  . Diabetes Mother   . Heart disease Mother   . Heart disease Sister   . Heart disease Brother    Social History   Social History  . Marital status: Married    Spouse name:  N/A  . Number of children: N/A  . Years of education: N/A   Social History Main Topics  . Smoking status: Never Smoker  . Smokeless tobacco: Never Used  . Alcohol use No  . Drug use: No  . Sexual activity: No   Other Topics Concern  . None   Social History Narrative  . None   Past Surgical History:  Procedure Laterality Date  . BACK SURGERY    . BLEPHAROPLASTY  2012   rt eye  . CARDIAC CATHETERIZATION     had one 10/2013  . CHOLECYSTECTOMY    . EYE SURGERY  2007   cataract ext/ iol rt eye  . JOINT REPLACEMENT  2001,2005   both knees  . LEFT HEART CATHETERIZATION WITH CORONARY ANGIOGRAM N/A 10/18/2013   Procedure: LEFT HEART CATHETERIZATION WITH CORONARY ANGIOGRAM;  Surgeon: Corky Crafts, MD;  Location: [facility] CATH LAB;  Service: Cardiovascular;  Laterality: N/A;  . LUMBAR DISC SURGERY  1990  . REVERSE SHOULDER ARTHROPLASTY Left 12/03/2013   Procedure: LEFT SHOULDER REVERSE ARTHROPLASTY;  Surgeon: Verlee Rossetti, MD;  Location: [facility] OR;  Service: Orthopedics;  Laterality: Left;  . SHOULDER HEMI-ARTHROPLASTY  12/25/2010   Procedure: SHOULDER HEMI-ARTHROPLASTY;  Surgeon: Cammy Copa;  Location: [facility] OR;  Service: Orthopedics;  Laterality: Right;   Past Medical History:  Diagnosis Date  . Abnormal nuclear  stress test    2005, normal cath Dr. Elease Hashimoto, normal stress test 2012 Dr. Eldridge Dace  . Anemia    chronic  . Anxiety   . Arthritis    djd  . Blood transfusion 1979   for anemia  . Chest pain    evaluated @ [facility] cardiology; had a stress test 2012  . Complication of anesthesia    unable to breathe lying  flat on back  . Diabetes mellitus    type 2  niddm x 25 yrs  . Diverticular disease   . DJD (degenerative joint disease)    lumbar and cervical  . GERD (gastroesophageal reflux disease)   . Hyperlipemia   . Hypertension    on medication  . Hypothyroidism    takes levoxyl  . Irritable bowel syndrome   . 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  Neuromuscular disorder (HCC)   . Neuropathy in diabetes (HCC)   . Other and unspecified angina pectoris    tests came back NOT heart related  . Pulmonary embolism (HCC) 06-2010   bilateral  . Seizures (HCC)    once, age 66   BP (!) 148/77   Pulse 65   SpO2 91%   Opioid Risk Score:   Fall Risk Score:  `1  Depression screen PHQ 2/9  No flowsheet data found.  Review of Systems  HENT: Negative.   Respiratory: Negative.   Cardiovascular: Negative.   Gastrointestinal: Positive for diarrhea.  Endocrine:       High blood sugar  Genitourinary: Positive for difficulty urinating.  Musculoskeletal: Positive for arthralgias, back pain and gait problem.  Skin: Negative.   Allergic/Immunologic: Negative.   Hematological: Negative.   Psychiatric/Behavioral: Negative.   All other systems reviewed and are negative.     Objective:   Physical Exam Gen: NAD. Vital signs reviewed HENT: Normocephalic, Atraumatic Eyes: EOMI. No discharge.  Cardio: RRR. No JVD. Pulm: B/l clear to auscultation.  Effort normal. Abd: Soft, BS+ MSK:  Gait kyphotic.   Minimal TTP along b/l lumar PSPs and gluteal muscles.    No edema.  Neuro:   Strength  4-4+/5 B/l LE HF, 4+/5 KE, 5/5 ADF/PF Skin: Warm and Dry. Intact    Assessment & Plan:  83 y/o female with pmh/psh of seizures, PE, DM with neuropathy, DJD, solitary kidney, TIA, left shoulder OA, lumbar spinal stensosis, b/l shoulder surgery presents for follow up for low back pain  1. Chronic mechanical low back pain  MRI 07/2016 reviewed, showing L4-5 stenosis with facet arthropathy.    Pt states she has had ESIs without benefit  Will try to avoid NSAIDs due to solitary kidney  Pt told not to use Voltaren gel by Neurosurg  Cont Heat  Cont TENS IT, with benefit  Cont bracing for periods of excessive activity   Cont Robaxin  TID PRN  Cont Gabapentin to 300 TID  Pt could not afford aquatic therapy after 1 visit, encouraged follow up at local  pool  Cont Cymbalta   Will consider facet injections in future if necessary  2. Gait abnormality  Cont cane for safety, educated on environmental modifications to limit falls  3. Sleep disturbance  D/ced xanax  Cont trazodone to   4. Diabetic neuropathy  Confounding picture   See #1  5. Myalgias  Will consider trigger point injections in future if necessary

## 2016-11-22 ENCOUNTER — Other Ambulatory Visit: Payer: Self-pay | Admitting: Physical Medicine & Rehabilitation

## 2016-12-30 DIAGNOSIS — H35039 Hypertensive retinopathy, unspecified eye: Secondary | ICD-10-CM | POA: Diagnosis not present

## 2016-12-30 DIAGNOSIS — E039 Hypothyroidism, unspecified: Secondary | ICD-10-CM | POA: Diagnosis not present

## 2016-12-30 DIAGNOSIS — E1169 Type 2 diabetes mellitus with other specified complication: Secondary | ICD-10-CM | POA: Diagnosis not present

## 2016-12-30 DIAGNOSIS — E113293 Type 2 diabetes mellitus with mild nonproliferative diabetic retinopathy without macular edema, bilateral: Secondary | ICD-10-CM | POA: Diagnosis not present

## 2017-01-15 ENCOUNTER — Telehealth: Payer: Self-pay | Admitting: *Deleted

## 2017-01-15 NOTE — Telephone Encounter (Signed)
NOTES SENT TO SCHEDULING.  °

## 2017-01-20 ENCOUNTER — Other Ambulatory Visit: Payer: Self-pay | Admitting: Family Medicine

## 2017-01-20 DIAGNOSIS — R011 Cardiac murmur, unspecified: Secondary | ICD-10-CM

## 2017-01-21 ENCOUNTER — Encounter (INDEPENDENT_AMBULATORY_CARE_PROVIDER_SITE_OTHER): Payer: Self-pay

## 2017-01-21 ENCOUNTER — Other Ambulatory Visit: Payer: Self-pay

## 2017-01-21 ENCOUNTER — Ambulatory Visit (HOSPITAL_COMMUNITY): Payer: Medicare Other | Attending: Cardiology

## 2017-01-21 DIAGNOSIS — I051 Rheumatic mitral insufficiency: Secondary | ICD-10-CM | POA: Diagnosis not present

## 2017-01-21 DIAGNOSIS — R011 Cardiac murmur, unspecified: Secondary | ICD-10-CM | POA: Diagnosis not present

## 2017-01-21 DIAGNOSIS — I5030 Unspecified diastolic (congestive) heart failure: Secondary | ICD-10-CM | POA: Insufficient documentation

## 2017-02-13 ENCOUNTER — Encounter: Payer: Medicare Other | Admitting: Physical Medicine & Rehabilitation

## 2017-02-13 DIAGNOSIS — M2062 Acquired deformities of toe(s), unspecified, left foot: Secondary | ICD-10-CM | POA: Diagnosis not present

## 2017-02-13 DIAGNOSIS — E1169 Type 2 diabetes mellitus with other specified complication: Secondary | ICD-10-CM | POA: Diagnosis not present

## 2017-02-20 ENCOUNTER — Other Ambulatory Visit: Payer: Self-pay | Admitting: Physical Medicine & Rehabilitation

## 2017-02-25 DIAGNOSIS — I10 Essential (primary) hypertension: Secondary | ICD-10-CM | POA: Diagnosis not present

## 2017-02-26 ENCOUNTER — Encounter: Payer: Medicare Other | Attending: Physical Medicine & Rehabilitation | Admitting: Physical Medicine & Rehabilitation

## 2017-02-26 ENCOUNTER — Encounter: Payer: Self-pay | Admitting: Physical Medicine & Rehabilitation

## 2017-02-26 VITALS — BP 150/85 | HR 48 | Resp 14

## 2017-02-26 DIAGNOSIS — F419 Anxiety disorder, unspecified: Secondary | ICD-10-CM | POA: Diagnosis not present

## 2017-02-26 DIAGNOSIS — G479 Sleep disorder, unspecified: Secondary | ICD-10-CM | POA: Diagnosis not present

## 2017-02-26 DIAGNOSIS — E1142 Type 2 diabetes mellitus with diabetic polyneuropathy: Secondary | ICD-10-CM

## 2017-02-26 DIAGNOSIS — Z86711 Personal history of pulmonary embolism: Secondary | ICD-10-CM | POA: Insufficient documentation

## 2017-02-26 DIAGNOSIS — E039 Hypothyroidism, unspecified: Secondary | ICD-10-CM | POA: Diagnosis not present

## 2017-02-26 DIAGNOSIS — I10 Essential (primary) hypertension: Secondary | ICD-10-CM | POA: Insufficient documentation

## 2017-02-26 DIAGNOSIS — E785 Hyperlipidemia, unspecified: Secondary | ICD-10-CM | POA: Diagnosis not present

## 2017-02-26 DIAGNOSIS — K219 Gastro-esophageal reflux disease without esophagitis: Secondary | ICD-10-CM | POA: Insufficient documentation

## 2017-02-26 DIAGNOSIS — R569 Unspecified convulsions: Secondary | ICD-10-CM | POA: Insufficient documentation

## 2017-02-26 DIAGNOSIS — M19011 Primary osteoarthritis, right shoulder: Secondary | ICD-10-CM | POA: Insufficient documentation

## 2017-02-26 DIAGNOSIS — M19012 Primary osteoarthritis, left shoulder: Secondary | ICD-10-CM | POA: Diagnosis not present

## 2017-02-26 DIAGNOSIS — R531 Weakness: Secondary | ICD-10-CM | POA: Diagnosis not present

## 2017-02-26 DIAGNOSIS — K589 Irritable bowel syndrome without diarrhea: Secondary | ICD-10-CM | POA: Diagnosis not present

## 2017-02-26 DIAGNOSIS — R269 Unspecified abnormalities of gait and mobility: Secondary | ICD-10-CM | POA: Insufficient documentation

## 2017-02-26 DIAGNOSIS — M48061 Spinal stenosis, lumbar region without neurogenic claudication: Secondary | ICD-10-CM | POA: Diagnosis not present

## 2017-02-26 DIAGNOSIS — G894 Chronic pain syndrome: Secondary | ICD-10-CM

## 2017-02-26 DIAGNOSIS — M791 Myalgia, unspecified site: Secondary | ICD-10-CM | POA: Insufficient documentation

## 2017-02-26 DIAGNOSIS — M545 Low back pain: Secondary | ICD-10-CM | POA: Diagnosis present

## 2017-02-26 DIAGNOSIS — Z8673 Personal history of transient ischemic attack (TIA), and cerebral infarction without residual deficits: Secondary | ICD-10-CM | POA: Diagnosis not present

## 2017-02-26 DIAGNOSIS — G8929 Other chronic pain: Secondary | ICD-10-CM | POA: Diagnosis not present

## 2017-02-26 DIAGNOSIS — E1140 Type 2 diabetes mellitus with diabetic neuropathy, unspecified: Secondary | ICD-10-CM | POA: Insufficient documentation

## 2017-02-26 DIAGNOSIS — M5442 Lumbago with sciatica, left side: Secondary | ICD-10-CM

## 2017-02-26 NOTE — Progress Notes (Signed)
Subjective:    Patient ID: Hannah Fischer, female    DOB: 1932-10-09, 84 y.o.   MRN: [id number]  HPI 84 y/o female with pmh/psh of seizures, PE, DM with neuropathy, DJD, solitary kidney, TIA, left shoulder OA, lumbar spinal stensosis, b/l shoulder surgery presents for follow up for low back pain.  Initially stated: Started ~2012.   Denies inciting event.  Stable, but exacerbated after fall.  Meds improve the pain.  Standing, ambulation exacerbate the pain.  Achy pain.  Constant.  Radiates at times to left calf.  Has not tried anything.  Associated LLE weakness, numbness, tingling.  Pain prevents from doing ADLs.  2-3 falls in the last year.  She was prescribed Xanax for sleep by PCP.  Last clinic visit 11/1116.  Since last visit, pt states she continues to use TENS with benefit.  She states she followed up with rep, but did not receive call back. Has not been requiring medications as much.  Overall, she states she is doing well. Denies falls.   Pain Inventory Average Pain 1 Pain Right Now 1 My pain is aching  In the last 24 hours, has pain interfered with the following? General activity 0 Relation with others 0 Enjoyment of life 0 What TIME of day is your pain at its worst? na Sleep (in general) Fair  Pain is worse with: standing Pain improves with: medication Relief from Meds: 7  Mobility walk with assistance use a cane ability to climb steps?  yes do you drive?  yes transfers alone  Function retired  Neuro/Psych bladder control problems bowel control problems  Prior Studies Any changes since last visit?  no  Physicians involved in your care Any changes since last visit?  no   Family History  Problem Relation Age of Onset  . Diabetes Mother   . Heart disease Mother   . Heart disease Sister   . Heart disease Brother    Social History   Socioeconomic History  . Marital status: Married    Spouse name: None  . Number of children: None  . Years of education:  None  . Highest education level: None  Social Needs  . Financial resource strain: None  . Food insecurity - worry: None  . Food insecurity - inability: None  . Transportation needs - medical: None  . Transportation needs - non-medical: None  Occupational History  . None  Tobacco Use  . Smoking status: Never Smoker  . Smokeless tobacco: Never Used  Substance and Sexual Activity  . Alcohol use: No  . Drug use: No  . Sexual activity: No    Birth control/protection: Post-menopausal  Other Topics Concern  . None  Social History Narrative  . None   Past Surgical History:  Procedure Laterality Date  . BACK SURGERY    . BLEPHAROPLASTY  2012   rt eye  . CARDIAC CATHETERIZATION     had one 10/2013  . CHOLECYSTECTOMY    . EYE SURGERY  2007   cataract ext/ iol rt eye  . JOINT REPLACEMENT  2001,2005   both knees  . LEFT HEART CATHETERIZATION WITH CORONARY ANGIOGRAM N/A 10/18/2013   Procedure: LEFT HEART CATHETERIZATION WITH CORONARY ANGIOGRAM;  Surgeon: Corky Crafts, MD;  Location: [facility] CATH LAB;  Service: Cardiovascular;  Laterality: N/A;  . LUMBAR DISC SURGERY  1990  . REVERSE SHOULDER ARTHROPLASTY Left 12/03/2013   Procedure: LEFT SHOULDER REVERSE ARTHROPLASTY;  Surgeon: Verlee Rossetti, MD;  Location: [facility] OR;  Service:  Orthopedics;  Laterality: Left;  . SHOULDER HEMI-ARTHROPLASTY  12/25/2010   Procedure: SHOULDER HEMI-ARTHROPLASTY;  Surgeon: Cammy CopaGregory Scott Dean;  [facility];  Service: Orthopedics;  Laterality: Right;   Past Medical History:  Diagnosis Date  . Abnormal nuclear stress test    2005, normal cath Dr. Elease HashimotoNahser, normal stress test 2012 Dr. Eldridge DaceVaranasi  . Anemia    chronic  . Anxiety   . Arthritis    djd  . Blood transfusion 1979   for anemia  . Chest pain    evaluated @ [facility] cardiology; had a stress test 2012  . Complication of anesthesia    unable to breathe lying  flat on back  . Diabetes mellitus    type 2  niddm x 25 yrs  . Diverticular disease   .  DJD (degenerative joint disease)    lumbar and cervical  . GERD (gastroesophageal reflux disease)   . Hyperlipemia   . Hypertension    on medication  . Hypothyroidism    takes levoxyl  . Irritable bowel syndrome   . 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 Neuromuscular disorder (HCC)   . Neuropathy in diabetes (HCC)   . Other and unspecified angina pectoris    tests came back NOT heart related  . Pulmonary embolism (HCC) 06-2010   bilateral  . Seizures (HCC)    once, age 25   BP (!) 150/85 (BP Location: Left Arm, Patient Position: Sitting, Cuff Size: Normal)   Pulse (!) 48   Resp 14   SpO2 93%   Opioid Risk Score:   Fall Risk Score:  `1  Depression screen PHQ 2/9  No flowsheet data found.  Review of Systems  HENT: Negative.   Respiratory: Negative.   Cardiovascular: Negative.   Gastrointestinal: Positive for diarrhea.  Endocrine:       High blood sugar  Genitourinary: Positive for difficulty urinating.  Musculoskeletal: Positive for arthralgias, back pain and gait problem.  Skin: Negative.   Allergic/Immunologic: Negative.   Hematological: Negative.   Psychiatric/Behavioral: Negative.   All other systems reviewed and are negative.     Objective:   Physical Exam Gen: NAD. Vital signs reviewed HENT: Normocephalic, Atraumatic Eyes: EOMI. No discharge.  Cardio: RRR. No JVD. Pulm: B/l clear to auscultation.  Effort normal. Abd: Soft, BS+ MSK:  Gait kyphotic.   No TTP along b/l lumar PSPs.    No edema.  Neuro:   Strength  4+/5 B/l LE HF, 4+/5 KE, 5/5 ADF/PF Skin: Warm and Dry. Intact    Assessment & Plan:  84 y/o female with pmh/psh of seizures, PE, DM with neuropathy, DJD, solitary kidney, TIA, left shoulder OA, lumbar spinal stensosis, b/l shoulder surgery presents for follow up for low back pain  1. Chronic mechanical low back pain  MRI 07/2016 reviewed, showing L4-5 stenosis with facet arthropathy.    Pt states she has had ESIs without  benefit  Will try to avoid NSAIDs due to solitary kidney  Pt told not to use Voltaren gel by Neurosurg  Cont Heat  Cont TENS IT, with benefit, needs replacement pads, will give contact number for rep  Cont bracing for periods of excessive activity   Cont Robaxin 500mg  TID PRN  Cont Gabapentin to 300 TID  Pt could not afford aquatic therapy after 1 visit, encouraged follow up at local pool  Cont Cymbalta 30mg   Will consider facet injections in future if necessary  2. Gait abnormality  Cont cane  for safety, previously educated on environmental modifications to limit falls  3. Sleep disturbance  D/ced xanax  Cont trazodone to 50mg   4. Diabetic neuropathy  Confounding picture   See #1  5. Myalgias  Will consider trigger point injections in future if necessary

## 2017-03-11 ENCOUNTER — Other Ambulatory Visit (INDEPENDENT_AMBULATORY_CARE_PROVIDER_SITE_OTHER): Payer: Self-pay | Admitting: Specialist

## 2017-03-11 NOTE — Telephone Encounter (Signed)
Duloxetine Refill Request 

## 2017-04-03 ENCOUNTER — Other Ambulatory Visit: Payer: Self-pay | Admitting: Physical Medicine & Rehabilitation

## 2017-05-16 ENCOUNTER — Emergency Department (HOSPITAL_COMMUNITY)
Admission: EM | Admit: 2017-05-16 | Discharge: 2017-05-17 | Disposition: A | Discharge status: Home / Self Care | Payer: Medicare Other | Attending: Emergency Medicine | Admitting: Emergency Medicine

## 2017-05-16 ENCOUNTER — Encounter (HOSPITAL_COMMUNITY): Payer: Self-pay | Admitting: Emergency Medicine

## 2017-05-16 ENCOUNTER — Other Ambulatory Visit: Payer: Self-pay

## 2017-05-16 DIAGNOSIS — E039 Hypothyroidism, unspecified: Secondary | ICD-10-CM | POA: Diagnosis not present

## 2017-05-16 DIAGNOSIS — N390 Urinary tract infection, site not specified: Secondary | ICD-10-CM | POA: Diagnosis not present

## 2017-05-16 DIAGNOSIS — R1110 Vomiting, unspecified: Secondary | ICD-10-CM | POA: Diagnosis not present

## 2017-05-16 DIAGNOSIS — Z79899 Other long term (current) drug therapy: Secondary | ICD-10-CM | POA: Insufficient documentation

## 2017-05-16 DIAGNOSIS — Z7902 Long term (current) use of antithrombotics/antiplatelets: Secondary | ICD-10-CM | POA: Diagnosis not present

## 2017-05-16 DIAGNOSIS — Z96653 Presence of artificial knee joint, bilateral: Secondary | ICD-10-CM | POA: Insufficient documentation

## 2017-05-16 DIAGNOSIS — I10 Essential (primary) hypertension: Secondary | ICD-10-CM | POA: Diagnosis not present

## 2017-05-16 DIAGNOSIS — E119 Type 2 diabetes mellitus without complications: Secondary | ICD-10-CM | POA: Diagnosis not present

## 2017-05-16 DIAGNOSIS — Z7984 Long term (current) use of oral hypoglycemic drugs: Secondary | ICD-10-CM | POA: Diagnosis not present

## 2017-05-16 DIAGNOSIS — R509 Fever, unspecified: Secondary | ICD-10-CM | POA: Diagnosis not present

## 2017-05-16 LAB — COMPREHENSIVE METABOLIC PANEL
ALT: 24 U/L (ref 14–54)
AST: 31 U/L (ref 15–41)
Albumin: 3.3 g/dL — ABNORMAL LOW (ref 3.5–5.0)
Alkaline Phosphatase: 74 U/L (ref 38–126)
Anion gap: 13 (ref 5–15)
BUN: 15 mg/dL (ref 6–20)
CO2: 25 mmol/L (ref 22–32)
Calcium: 8.7 mg/dL — ABNORMAL LOW (ref 8.9–10.3)
Chloride: 97 mmol/L — ABNORMAL LOW (ref 101–111)
Creatinine, Ser: 0.97 mg/dL (ref 0.44–1.00)
GFR calc Af Amer: >60 mL/min (ref >60)
GFR calc non Af Amer: 53 mL/min — ABNORMAL LOW (ref >60)
Glucose, Bld: 176 mg/dL — ABNORMAL HIGH (ref 65–99)
Potassium: 3.5 mmol/L (ref 3.5–5.1)
Sodium: 135 mmol/L (ref 135–145)
Total Bilirubin: 1.2 mg/dL (ref 0.3–1.2)
Total Protein: 6.5 g/dL (ref 6.5–8.1)

## 2017-05-16 LAB — CBC
HCT: 34.9 % — ABNORMAL LOW (ref 36.0–46.0)
Hemoglobin: 11.2 g/dL — ABNORMAL LOW (ref 12.0–15.0)
MCH: 31.9 pg (ref 26.0–34.0)
MCHC: 32.1 g/dL (ref 30.0–36.0)
MCV: 99.4 fL (ref 78.0–100.0)
Platelets: 154 10*3/uL (ref 150–400)
RBC: 3.51 MIL/uL — ABNORMAL LOW (ref 3.87–5.11)
RDW: 14.1 % (ref 11.5–15.5)
WBC: 5.3 10*3/uL (ref 4.0–10.5)

## 2017-05-16 LAB — LIPASE, BLOOD: Lipase: 46 U/L (ref 11–51)

## 2017-05-16 NOTE — ED Triage Notes (Signed)
Patient is complaining of not having an appetite for a week. Patient has been vomiting and nauseated. Patient also had a fever at home and took tylenol.

## 2017-05-17 LAB — URINALYSIS, ROUTINE W REFLEX MICROSCOPIC
Bilirubin Urine: NEGATIVE
Glucose, UA: NEGATIVE mg/dL
Ketones, ur: 5 mg/dL — AB
Nitrite: NEGATIVE
Protein, ur: 30 mg/dL — AB
Specific Gravity, Urine: 1.014 (ref 1.005–1.030)
pH: 6 (ref 5.0–8.0)

## 2017-05-17 LAB — DIFFERENTIAL
Basophils Absolute: 0 10*3/uL (ref 0.0–0.1)
Basophils Relative: 0 %
Eosinophils Absolute: 0 10*3/uL (ref 0.0–0.7)
Eosinophils Relative: 0 %
Lymphocytes Relative: 6 %
Lymphs Abs: 0.3 10*3/uL — ABNORMAL LOW (ref 0.7–4.0)
Monocytes Absolute: 0.6 10*3/uL (ref 0.1–1.0)
Monocytes Relative: 11 %
Neutro Abs: 4.3 10*3/uL (ref 1.7–7.7)
Neutrophils Relative %: 83 %

## 2017-05-17 MED ORDER — CEFTRIAXONE SODIUM 1 G IJ SOLR
1.0000 g | Freq: Once | INTRAMUSCULAR | Status: AC
  Administered 2017-05-17: 1 g via INTRAVENOUS
  Filled 2017-05-17: qty 10

## 2017-05-17 MED ORDER — CEPHALEXIN 500 MG PO CAPS: 500.0000 mg | ORAL_CAPSULE | Freq: Two times a day (BID) | ORAL | 0 refills | Status: DC

## 2017-05-17 NOTE — ED Provider Notes (Signed)
[facility]-EMERGENCY DEPT Provider Note: Hannah Dell, MD, FACEP  CSN: [id number] MRN: [id number] ARRIVAL: 05/16/17 at 2225 ROOM: WA11/WA11   CHIEF COMPLAINT  Fever   HISTORY OF PRESENT ILLNESS  05/17/17 12:24 AM Hannah Fischer is a 84 y.o. female who is had a decreased appetite for several weeks.  She woke with chills this morning about 3 AM.  This evening about 9 PM she developed fever of 102 which then progressed to 103.  Her husband gave her 2 Tylenol tablets.  She had one episode of vomiting about 30 minutes later but denies nausea at the present time.  Her temperature was noted to be 99.9 on arrival.  She complains right now only of a headache and burning with urination.  The dysuria is consistent with previous urinary tract infections.  She denies chest pain, shortness of breath and abdominal pain.  She has chronic diarrhea due to IBS.   Past Medical History:  Diagnosis Date  . Abnormal nuclear stress test    2005, normal cath Dr. Elease Hashimoto, normal stress test 2012 Dr. Eldridge Dace  . Anemia    chronic  . Anxiety   . Arthritis    djd  . Blood transfusion 1979   for anemia  . Chest pain    evaluated @ [facility] cardiology; had a stress test 2012  . Complication of anesthesia    unable to breathe lying  flat on back  . Diabetes mellitus    type 2  niddm x 25 yrs  . Diverticular disease   . DJD (degenerative joint disease)    lumbar and cervical  . GERD (gastroesophageal reflux disease)   . Hyperlipemia   . Hypertension    on medication  . Hypothyroidism    takes levoxyl  . Irritable bowel syndrome   . 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 Neuromuscular disorder (HCC)   . Neuropathy in diabetes (HCC)   . Other and unspecified angina pectoris    tests came back NOT heart related  . Pulmonary embolism (HCC) 06-2010   bilateral  . Seizures (HCC)    once, age 49    Past Surgical History:  Procedure Laterality Date  . BACK SURGERY    . BLEPHAROPLASTY  2012   rt  eye  . CARDIAC CATHETERIZATION     had one 10/2013  . CHOLECYSTECTOMY    . EYE SURGERY  2007   cataract ext/ iol rt eye  . JOINT REPLACEMENT  2001,2005   both knees  . LEFT HEART CATHETERIZATION WITH CORONARY ANGIOGRAM N/A 10/18/2013   Procedure: LEFT HEART CATHETERIZATION WITH CORONARY ANGIOGRAM;  Surgeon: Corky Crafts, MD;  Location: [facility] CATH LAB;  Service: Cardiovascular;  Laterality: N/A;  . LUMBAR DISC SURGERY  1990  . REVERSE SHOULDER ARTHROPLASTY Left 12/03/2013   Procedure: LEFT SHOULDER REVERSE ARTHROPLASTY;  Surgeon: Verlee Rossetti, MD;  Location: [facility] OR;  Service: Orthopedics;  Laterality: Left;  . SHOULDER HEMI-ARTHROPLASTY  12/25/2010   Procedure: SHOULDER HEMI-ARTHROPLASTY;  Surgeon: Cammy Copa;  Location: [facility] OR;  Service: Orthopedics;  Laterality: Right;    Family History  Problem Relation Age of Onset  . Diabetes Mother   . Heart disease Mother   . Heart disease Sister   . Heart disease Brother     Social History   Tobacco Use  . Smoking status: Never Smoker  . Smokeless tobacco: Never Used  Substance Use Topics  . Alcohol use: No  . Drug  use: No    Prior to Admission medications   Medication Sig Start Date End Date Taking? Authorizing Provider  Cholecalciferol (VITAMIN D3) 2000 UNITS TABS Take 2,000 Units by mouth daily.    Yes [provider]  clopidogrel (PLAVIX) 75 MG tablet Take 1 tablet (75 mg total) by mouth daily with breakfast. 11/17/11  Yes Vassie Loll, MD  Cyanocobalamin (VITAMIN B-12 PO) Take 1 tablet by mouth daily.    Yes [provider]  DULoxetine (CYMBALTA) 30 MG capsule TAKE 1 CAPSULE(30 MG) BY MOUTH DAILY 04/03/17  Yes Patel, Maryln Gottron, MD  gabapentin (NEURONTIN) 300 MG capsule TAKE 1 CAPSULE(300 MG) BY MOUTH THREE TIMES DAILY Patient taking differently: Take 300 mg by mouth at bedtime 02/20/17  Yes Patel, Maryln Gottron, MD  levothyroxine (SYNTHROID, LEVOTHROID) 100 MCG tablet Take 1 tablet (100 mcg total) by  mouth daily. 11/17/11  Yes Vassie Loll, MD  losartan (COZAAR) 50 MG tablet Take 100 mg by mouth daily.  08/29/13  Yes [provider]  metFORMIN (GLUCOPHAGE) 1000 MG tablet Take 1,000 mg by mouth 2 (two) times daily with a meal.   Yes [provider]  methocarbamol (ROBAXIN) 500 MG tablet Take 1 tablet (500 mg total) by mouth every 8 (eight) hours as needed for muscle spasms. 08/29/16  Yes Patel, Maryln Gottron, MD  Omega-3 Fatty Acids (FISH OIL) 1000 MG CAPS Take 2 capsules by mouth 2 (two) times daily.    Yes [provider]  pioglitazone (ACTOS) 45 MG tablet Take 45 mg by mouth daily.     Yes [provider]  propranolol (INDERAL) 40 MG tablet Take 40 mg by mouth 2 (two) times daily. 04/13/17  Yes [provider]  traZODone (DESYREL) 50 MG tablet TAKE 1 TABLET BY MOUTH AT BEDTIME 04/03/17  Yes Patel, Maryln Gottron, MD  diclofenac sodium (VOLTAREN) 1 % GEL Apply 2 g topically 4 (four) times daily. Patient not taking: Reported on 05/17/2017 07/12/16   Marcello Fennel, MD  DULoxetine (CYMBALTA) 20 MG capsule TAKE 1 CAPSULE BY MOUTH EVERY MORNING Patient not taking: Reported on 05/17/2017 03/11/17   Kerrin Champagne, MD    Allergies Sulfa antibiotics; Sulfites; and Codeine   REVIEW OF SYSTEMS  Negative except as noted here or in the History of Present Illness.   PHYSICAL EXAMINATION  Initial Vital Signs Blood pressure (!) 154/64, pulse 78, temperature 99.9 F (37.7 C), temperature source Oral, resp. rate 18, height 5\' 3"  (1.6 m), weight 78 kg (172 lb), SpO2 91 %.  Examination General: Well-developed, well-nourished female in no acute distress; appearance consistent with age of record HENT: normocephalic; atraumatic Eyes: pupils equal, round and reactive to light; extraocular muscles intact; bilateral pseudophakia Neck: supple Heart: regular rate and rhythm Lungs: clear to auscultation bilaterally Abdomen: soft; nondistended; nontender; no masses or  hepatosplenomegaly; bowel sounds present GU: No CVA tenderness Extremities: No deformity; full range of motion; pulses normal Neurologic: Awake, alert and oriented; motor function intact in all extremities and symmetric; no facial droop Skin: Warm and dry Psychiatric: Normal mood and affect   RESULTS  Summary of this visit's results, reviewed by myself:   EKG Interpretation  Date/Time:    Ventricular Rate:    PR Interval:    QRS Duration:   QT Interval:    QTC Calculation:   R Axis:     Text Interpretation:        Laboratory Studies: Results for orders placed or performed during the hospital encounter of  05/16/17 (from the past 24 hour(s))  Urinalysis, Routine w reflex microscopic     Status: Abnormal   Collection Time: 05/16/17 10:45 PM  Result Value Ref Range   Color, Urine AMBER (A) YELLOW   APPearance CLOUDY (A) CLEAR   Specific Gravity, Urine 1.014 1.005 - 1.030   pH 6.0 5.0 - 8.0   Glucose, UA NEGATIVE NEGATIVE mg/dL   Hgb urine dipstick MODERATE (A) NEGATIVE   Bilirubin Urine NEGATIVE NEGATIVE   Ketones, ur 5 (A) NEGATIVE mg/dL   Protein, ur 30 (A) NEGATIVE mg/dL   Nitrite NEGATIVE NEGATIVE   Leukocytes, UA MODERATE (A) NEGATIVE   RBC / HPF 6-30 0 - 5 RBC/hpf   WBC, UA TOO NUMEROUS TO COUNT 0 - 5 WBC/hpf   Bacteria, UA RARE (A) NONE SEEN   Squamous Epithelial / LPF 0-5 (A) NONE SEEN   WBC Clumps PRESENT    Mucus PRESENT    Non Squamous Epithelial 0-5 (A) NONE SEEN  Lipase, blood     Status: None   Collection Time: 05/16/17 11:08 PM  Result Value Ref Range   Lipase 46 11 - 51 U/L  Comprehensive metabolic panel     Status: Abnormal   Collection Time: 05/16/17 11:08 PM  Result Value Ref Range   Sodium 135 135 - 145 mmol/L   Potassium 3.5 3.5 - 5.1 mmol/L   Chloride 97 (L) 101 - 111 mmol/L   CO2 25 22 - 32 mmol/L   Glucose, Bld 176 (H) 65 - 99 mg/dL   BUN 15 6 - 20 mg/dL   Creatinine, Ser 2.950.97 0.44 - 1.00 mg/dL   Calcium 8.7 (L) 8.9 - 10.3 mg/dL    Total Protein 6.5 6.5 - 8.1 g/dL   Albumin 3.3 (L) 3.5 - 5.0 g/dL   AST 31 15 - 41 U/L   ALT 24 14 - 54 U/L   Alkaline Phosphatase 74 38 - 126 U/L   Total Bilirubin 1.2 0.3 - 1.2 mg/dL   GFR calc non Af Amer 53 (L) >60 mL/min   GFR calc Af Amer >60 >60 mL/min   Anion gap 13 5 - 15  CBC     Status: Abnormal   Collection Time: 05/16/17 11:08 PM  Result Value Ref Range   WBC 5.3 4.0 - 10.5 K/uL   RBC 3.51 (L) 3.87 - 5.11 MIL/uL   Hemoglobin 11.2 (L) 12.0 - 15.0 g/dL   HCT 62.134.9 (L) 30.836.0 - 65.746.0 %   MCV 99.4 78.0 - 100.0 fL   MCH 31.9 26.0 - 34.0 pg   MCHC 32.1 30.0 - 36.0 g/dL   RDW 84.614.1 96.211.5 - 95.215.5 %   Platelets 154 150 - 400 K/uL  Differential     Status: Abnormal   Collection Time: 05/17/17 12:18 AM  Result Value Ref Range   Neutrophils Relative % 83 %   Neutro Abs 4.3 1.7 - 7.7 K/uL   Lymphocytes Relative 6 %   Lymphs Abs 0.3 (L) 0.7 - 4.0 K/uL   Monocytes Relative 11 %   Monocytes Absolute 0.6 0.1 - 1.0 K/uL   Eosinophils Relative 0 %   Eosinophils Absolute 0.0 0.0 - 0.7 K/uL   Basophils Relative 0 %   Basophils Absolute 0.0 0.0 - 0.1 K/uL   Imaging Studies: No results found.  ED COURSE  Nursing notes and initial vitals signs, including pulse oximetry, reviewed.  Vitals:   05/16/17 2238 05/16/17 2243 05/17/17 0049  BP: (!) 154/64  (!) 152/70  Pulse: 78  79  Resp: 18  15  Temp: 99.9 F (37.7 C)    TempSrc: Oral    SpO2: 91%  94%  Weight:  78 kg (172 lb)   Height:  5\' 3"  (1.6 m)    1:42 AM Rocephin 1 g IV ordered for urinary tract infection.  Urine sent for culture.  2:52 AM Patient without complaint.  Rocephin has been administered.  Will discharge home on oral antibiotics.  PROCEDURES    ED DIAGNOSES     ICD-10-CM   1. Lower urinary tract infectious disease N39.0        Angelli Baruch, MD 05/17/17 [phone]

## 2017-05-19 LAB — URINE CULTURE: Culture: >=100000 — AB

## 2017-05-20 ENCOUNTER — Telehealth: Payer: Self-pay | Admitting: *Deleted

## 2017-05-20 NOTE — Telephone Encounter (Signed)
Post ED Visit - Positive Culture Follow-up  Culture report reviewed by antimicrobial stewardship pharmacist:  []  Enzo BiNathan Batchelder, Pharm.D. []  Celedonio MiyamotoJeremy Frens, Pharm.D., BCPS AQ-ID []  Garvin FilaMike Maccia, Pharm.D., BCPS []  Georgina PillionElizabeth Martin, Pharm.D., BCPS []  SnydertownMinh Pham, [address] BoulevardPharm.D., BCPS, AAHIVP [x]  Estella HuskMichelle Turner, Pharm.D., BCPS, AAHIVP []  Lysle Pearlachel Rumbarger, PharmD, BCPS []  Blake DivineShannon Parkey, PharmD []  Pollyann SamplesAndy Johnston, PharmD, BCPS  Positive urine culture Treated with Cephalexin, organism sensitive to the same and no further patient follow-up is required at this time.  Virl AxeRobertson, Zacharee Gaddie [facility] 05/20/2017, 9:42 AM

## 2017-06-08 ENCOUNTER — Other Ambulatory Visit (INDEPENDENT_AMBULATORY_CARE_PROVIDER_SITE_OTHER): Payer: Self-pay | Admitting: Specialist

## 2017-06-09 NOTE — Telephone Encounter (Signed)
Duloxetine refill request

## 2017-06-19 DIAGNOSIS — N3001 Acute cystitis with hematuria: Secondary | ICD-10-CM | POA: Diagnosis not present

## 2017-06-19 DIAGNOSIS — R350 Frequency of micturition: Secondary | ICD-10-CM | POA: Diagnosis not present

## 2017-06-30 ENCOUNTER — Other Ambulatory Visit: Payer: Self-pay | Admitting: Physical Medicine & Rehabilitation

## 2017-07-02 DIAGNOSIS — I10 Essential (primary) hypertension: Secondary | ICD-10-CM | POA: Diagnosis not present

## 2017-07-02 DIAGNOSIS — Z87440 Personal history of urinary (tract) infections: Secondary | ICD-10-CM | POA: Diagnosis not present

## 2017-07-02 DIAGNOSIS — E113293 Type 2 diabetes mellitus with mild nonproliferative diabetic retinopathy without macular edema, bilateral: Secondary | ICD-10-CM | POA: Diagnosis not present

## 2017-07-02 DIAGNOSIS — R197 Diarrhea, unspecified: Secondary | ICD-10-CM | POA: Diagnosis not present

## 2017-07-31 DIAGNOSIS — N390 Urinary tract infection, site not specified: Secondary | ICD-10-CM | POA: Diagnosis not present

## 2017-07-31 DIAGNOSIS — R350 Frequency of micturition: Secondary | ICD-10-CM | POA: Diagnosis not present

## 2017-07-31 DIAGNOSIS — N3946 Mixed incontinence: Secondary | ICD-10-CM | POA: Diagnosis not present

## 2017-07-31 DIAGNOSIS — R351 Nocturia: Secondary | ICD-10-CM | POA: Diagnosis not present

## 2017-08-01 ENCOUNTER — Emergency Department (HOSPITAL_COMMUNITY)
Admission: EM | Admit: 2017-08-01 | Discharge: 2017-08-01 | Disposition: A | Discharge status: Home / Self Care | Payer: Medicare Other | Attending: Emergency Medicine | Admitting: Emergency Medicine

## 2017-08-01 DIAGNOSIS — Z7984 Long term (current) use of oral hypoglycemic drugs: Secondary | ICD-10-CM | POA: Insufficient documentation

## 2017-08-01 DIAGNOSIS — Z96612 Presence of left artificial shoulder joint: Secondary | ICD-10-CM | POA: Diagnosis not present

## 2017-08-01 DIAGNOSIS — N390 Urinary tract infection, site not specified: Secondary | ICD-10-CM | POA: Diagnosis not present

## 2017-08-01 DIAGNOSIS — I10 Essential (primary) hypertension: Secondary | ICD-10-CM | POA: Insufficient documentation

## 2017-08-01 DIAGNOSIS — Z79899 Other long term (current) drug therapy: Secondary | ICD-10-CM | POA: Insufficient documentation

## 2017-08-01 DIAGNOSIS — Z8673 Personal history of transient ischemic attack (TIA), and cerebral infarction without residual deficits: Secondary | ICD-10-CM | POA: Insufficient documentation

## 2017-08-01 DIAGNOSIS — E1140 Type 2 diabetes mellitus with diabetic neuropathy, unspecified: Secondary | ICD-10-CM | POA: Diagnosis not present

## 2017-08-01 DIAGNOSIS — Z86711 Personal history of pulmonary embolism: Secondary | ICD-10-CM | POA: Insufficient documentation

## 2017-08-01 DIAGNOSIS — Z96653 Presence of artificial knee joint, bilateral: Secondary | ICD-10-CM | POA: Insufficient documentation

## 2017-08-01 DIAGNOSIS — R319 Hematuria, unspecified: Secondary | ICD-10-CM

## 2017-08-01 DIAGNOSIS — R109 Unspecified abdominal pain: Secondary | ICD-10-CM | POA: Diagnosis not present

## 2017-08-01 DIAGNOSIS — Z7902 Long term (current) use of antithrombotics/antiplatelets: Secondary | ICD-10-CM | POA: Diagnosis not present

## 2017-08-01 DIAGNOSIS — Z96611 Presence of right artificial shoulder joint: Secondary | ICD-10-CM | POA: Insufficient documentation

## 2017-08-01 DIAGNOSIS — E039 Hypothyroidism, unspecified: Secondary | ICD-10-CM | POA: Insufficient documentation

## 2017-08-01 LAB — URINALYSIS, ROUTINE W REFLEX MICROSCOPIC
Bilirubin Urine: NEGATIVE
Glucose, UA: NEGATIVE mg/dL
Ketones, ur: NEGATIVE mg/dL
Nitrite: NEGATIVE
Protein, ur: NEGATIVE mg/dL
Specific Gravity, Urine: 1.009 (ref 1.005–1.030)
pH: 5 (ref 5.0–8.0)

## 2017-08-01 LAB — COMPREHENSIVE METABOLIC PANEL
ALT: 9 U/L (ref 0–44)
AST: 16 U/L (ref 15–41)
Albumin: 3.3 g/dL — ABNORMAL LOW (ref 3.5–5.0)
Alkaline Phosphatase: 58 U/L (ref 38–126)
Anion gap: 9 (ref 5–15)
BUN: 13 mg/dL (ref 8–23)
CO2: 27 mmol/L (ref 22–32)
Calcium: 9.2 mg/dL (ref 8.9–10.3)
Chloride: 108 mmol/L (ref 98–111)
Creatinine, Ser: 1.09 mg/dL — ABNORMAL HIGH (ref 0.44–1.00)
GFR calc Af Amer: 53 mL/min — ABNORMAL LOW (ref >60)
GFR calc non Af Amer: 46 mL/min — ABNORMAL LOW (ref >60)
Glucose, Bld: 189 mg/dL — ABNORMAL HIGH (ref 70–99)
Potassium: 3.6 mmol/L (ref 3.5–5.1)
Sodium: 144 mmol/L (ref 135–145)
Total Bilirubin: 0.8 mg/dL (ref 0.3–1.2)
Total Protein: 6.5 g/dL (ref 6.5–8.1)

## 2017-08-01 LAB — CBC WITH DIFFERENTIAL/PLATELET
Basophils Absolute: 0 10*3/uL (ref 0.0–0.1)
Basophils Relative: 0 %
Eosinophils Absolute: 0.1 10*3/uL (ref 0.0–0.7)
Eosinophils Relative: 1 %
HCT: 35.5 % — ABNORMAL LOW (ref 36.0–46.0)
Hemoglobin: 11.2 g/dL — ABNORMAL LOW (ref 12.0–15.0)
Lymphocytes Relative: 13 %
Lymphs Abs: 0.8 10*3/uL (ref 0.7–4.0)
MCH: 30.5 pg (ref 26.0–34.0)
MCHC: 31.5 g/dL (ref 30.0–36.0)
MCV: 96.7 fL (ref 78.0–100.0)
Monocytes Absolute: 0.5 10*3/uL (ref 0.1–1.0)
Monocytes Relative: 8 %
Neutro Abs: 5 10*3/uL (ref 1.7–7.7)
Neutrophils Relative %: 78 %
Platelets: 269 10*3/uL (ref 150–400)
RBC: 3.67 MIL/uL — ABNORMAL LOW (ref 3.87–5.11)
RDW: 14.2 % (ref 11.5–15.5)
WBC: 6.5 10*3/uL (ref 4.0–10.5)

## 2017-08-01 LAB — LIPASE, BLOOD: Lipase: 32 U/L (ref 11–51)

## 2017-08-01 MED ORDER — SODIUM CHLORIDE 0.9 % IV SOLN
INTRAVENOUS | Status: DC
  Administered 2017-08-01: 14:00:00 via INTRAVENOUS

## 2017-08-01 MED ORDER — SODIUM CHLORIDE 0.9 % IV SOLN
1.0000 g | Freq: Once | INTRAVENOUS | Status: AC
  Administered 2017-08-01: 1 g via INTRAVENOUS
  Filled 2017-08-01: qty 10

## 2017-08-01 NOTE — ED Notes (Signed)
Pt ambulated to restroom without assistance.

## 2017-08-01 NOTE — Discharge Instructions (Addendum)
Continue the new antibiotic that you were given yesterday, follow-up with your urologist as planned, return to the emergency room for fever, vomiting, worsening symptoms

## 2017-08-01 NOTE — ED Provider Notes (Signed)
[facility]-EMERGENCY DEPT Provider Note   CSN: [id number] Arrival date & time: 08/01/17  1209     History   Chief Complaint Chief Complaint  Patient presents with  . Abdominal Pain    HPI MARYSOL WELLNITZ is a 84 y.o. female.  HPI Pt started having pain in her right side and back that started this am around 10am.  She has been having trouble with urinary sx and was seen by urology yesterday.  She had a "scope procedure".  Last night she had some mild discomfort. The pain was more intense this morning so she came to the ED.  As she has been waiting the pain has resolved.  No fever.  No vomiting.  SHe has chronic diarrhea issues but nothing new.  Past Medical History:  Diagnosis Date  . Abnormal nuclear stress test    2005, normal cath Dr. Elease Hashimoto, normal stress test 2012 Dr. Eldridge Dace  . Anemia    chronic  . Anxiety   . Arthritis    djd  . Blood transfusion 1979   for anemia  . Chest pain    evaluated @ [facility] cardiology; had a stress test 2012  . Complication of anesthesia    unable to breathe lying  flat on back  . Diabetes mellitus    type 2  niddm x 25 yrs  . Diverticular disease   . DJD (degenerative joint disease)    lumbar and cervical  . GERD (gastroesophageal reflux disease)   . Hyperlipemia   . Hypertension    on medication  . Hypothyroidism    takes levoxyl  . Irritable bowel syndrome   . 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 Neuromuscular disorder (HCC)   . Neuropathy in diabetes (HCC)   . Other and unspecified angina pectoris    tests came back NOT heart related  . Pulmonary embolism (HCC) 06-2010   bilateral  . Seizures (HCC)    once, age 36    Patient Active Problem List   Diagnosis Date Noted  . Arthritis of shoulder region, left, degenerative 12/03/2013  . Nonspecific abnormal electrocardiogram (ECG) (EKG) 10/15/2013  . Other and unspecified angina pectoris 10/15/2013  . TIA (transient ischemic attack)  11/15/2011  . HTN (hypertension) 11/15/2011  . Diabetes mellitus (HCC) 11/15/2011  . Hypothyroidism 11/15/2011  . Dyslipidemia 11/15/2011  . GERD (gastroesophageal reflux disease) 11/15/2011  . Pyuria 11/15/2011    Past Surgical History:  Procedure Laterality Date  . BACK SURGERY    . BLEPHAROPLASTY  2012   rt eye  . CARDIAC CATHETERIZATION     had one 10/2013  . CHOLECYSTECTOMY    . EYE SURGERY  2007   cataract ext/ iol rt eye  . JOINT REPLACEMENT  2001,2005   both knees  . LEFT HEART CATHETERIZATION WITH CORONARY ANGIOGRAM N/A 10/18/2013   Procedure: LEFT HEART CATHETERIZATION WITH CORONARY ANGIOGRAM;  Surgeon: Corky Crafts, MD;  Location: [facility] CATH LAB;  Service: Cardiovascular;  Laterality: N/A;  . LUMBAR DISC SURGERY  1990  . REVERSE SHOULDER ARTHROPLASTY Left 12/03/2013   Procedure: LEFT SHOULDER REVERSE ARTHROPLASTY;  Surgeon: Verlee Rossetti, MD;  Location: [facility] OR;  Service: Orthopedics;  Laterality: Left;  . SHOULDER HEMI-ARTHROPLASTY  12/25/2010   Procedure: SHOULDER HEMI-ARTHROPLASTY;  Surgeon: Cammy Copa;  Location: [facility] OR;  Service: Orthopedics;  Laterality: Right;     OB History   None      Home Medications  Prior to Admission medications   Medication Sig Start Date End Date Taking? Authorizing Provider  ALPRAZolam Prudy Feeler) 0.25 MG tablet Take 0.25 mg by mouth at bedtime as needed for sleep. 07/02/17  Yes [provider]  Cholecalciferol (VITAMIN D3) 2000 UNITS TABS Take 2,000 Units by mouth daily.    Yes [provider]  ciprofloxacin (CIPRO) 250 MG tablet Take 250 mg by mouth 2 (two) times daily. 07/31/17  Yes [provider]  clopidogrel (PLAVIX) 75 MG tablet Take 1 tablet (75 mg total) by mouth daily with breakfast. 11/17/11  Yes Vassie Loll, MD  Cyanocobalamin (VITAMIN B-12 PO) Take 1 tablet by mouth daily.    Yes [provider]  DULoxetine (CYMBALTA) 30 MG capsule TAKE 1 CAPSULE BY MOUTH EVERY DAY 07/01/17   Yes Patel, Maryln Gottron, MD  gabapentin (NEURONTIN) 300 MG capsule TAKE 1 CAPSULE(300 MG) BY MOUTH THREE TIMES DAILY Patient taking differently: Take 300 mg by mouth at bedtime 02/20/17  Yes Patel, Maryln Gottron, MD  levothyroxine (SYNTHROID, LEVOTHROID) 100 MCG tablet Take 1 tablet (100 mcg total) by mouth daily. 11/17/11  Yes Vassie Loll, MD  losartan (COZAAR) 50 MG tablet Take 100 mg by mouth daily.  08/29/13  Yes [provider]  metFORMIN (GLUCOPHAGE) 1000 MG tablet Take 1,000 mg by mouth 2 (two) times daily with a meal.   Yes [provider]  methocarbamol (ROBAXIN) 500 MG tablet Take 1 tablet (500 mg total) by mouth every 8 (eight) hours as needed for muscle spasms. 08/29/16  Yes Patel, Maryln Gottron, MD  Omega-3 Fatty Acids (FISH OIL) 1000 MG CAPS Take 1 capsule by mouth 2 (two) times daily.    Yes [provider]  pioglitazone (ACTOS) 45 MG tablet Take 45 mg by mouth daily.     Yes [provider]  propranolol (INDERAL) 40 MG tablet Take 40 mg by mouth daily.  04/13/17  Yes [provider]  traZODone (DESYREL) 50 MG tablet TAKE 1 TABLET BY MOUTH AT BEDTIME 07/01/17  Yes Patel, Maryln Gottron, MD  cephALEXin (KEFLEX) 500 MG capsule Take 1 capsule (500 mg total) by mouth 2 (two) times daily. Patient not taking: Reported on 08/01/2017 05/17/17   Molpus, Jonny Ruiz, MD    Family History Family History  Problem Relation Age of Onset  . Diabetes Mother   . Heart disease Mother   . Heart disease Sister   . Heart disease Brother     Social History Social History   Tobacco Use  . Smoking status: Never Smoker  . Smokeless tobacco: Never Used  Substance Use Topics  . Alcohol use: No  . Drug use: No     Allergies   Codeine; Sulfites; and Zocor [simvastatin]   Review of Systems Review of Systems  All other systems reviewed and are negative.    Physical Exam Updated Vital Signs BP (!) 185/70   Pulse (!) 59   Temp (!) 97.4 F (36.3 C) (Oral)    Resp 20   SpO2 98%   Physical Exam  Constitutional: She appears well-developed and well-nourished.  Non-toxic appearance. She does not appear ill.  HENT:  Head: Normocephalic and atraumatic.  Right Ear: External ear normal.  Left Ear: External ear normal.  Eyes: Conjunctivae are normal. Right eye exhibits no discharge. Left eye exhibits no discharge. No scleral icterus.  Neck: Neck supple. No tracheal deviation present.  Cardiovascular: Normal rate, regular rhythm and intact distal pulses.  Pulmonary/Chest: Effort normal and breath sounds normal. No  stridor. No respiratory distress. She has no wheezes. She has no rales.  Abdominal: Soft. Bowel sounds are normal. She exhibits no distension. There is tenderness in the right lower quadrant. There is no rebound and no guarding.  Musculoskeletal: She exhibits no edema or tenderness.  Neurological: She is alert. She has normal strength. No cranial nerve deficit (no facial droop, extraocular movements intact, no slurred speech) or sensory deficit. She exhibits normal muscle tone. She displays no seizure activity. Coordination normal.  Skin: Skin is warm and dry. No rash noted.  Psychiatric: She has a normal mood and affect.  Nursing note and vitals reviewed.    ED Treatments / Results  Labs (all labs ordered are listed, but only abnormal results are displayed) Labs Reviewed  COMPREHENSIVE METABOLIC PANEL - Abnormal; Notable for the following components:      Result Value   Glucose, Bld 189 (*)    Creatinine, Ser 1.09 (*)    Albumin 3.3 (*)    GFR calc non Af Amer 46 (*)    GFR calc Af Amer 53 (*)    All other components within normal limits  CBC WITH DIFFERENTIAL/PLATELET - Abnormal; Notable for the following components:   RBC 3.67 (*)    Hemoglobin 11.2 (*)    HCT 35.5 (*)    All other components within normal limits  URINALYSIS, ROUTINE W REFLEX MICROSCOPIC - Abnormal; Notable for the following components:   APPearance HAZY (*)     Hgb urine dipstick MODERATE (*)    Leukocytes, UA MODERATE (*)    Bacteria, UA RARE (*)    All other components within normal limits  URINE CULTURE  LIPASE, BLOOD     Procedures Procedures (including critical care time)  Medications Ordered in ED Medications  0.9 %  sodium chloride infusion ( Intravenous New Bag/Given 08/01/17 1344)  cefTRIAXone (ROCEPHIN) 1 g in sodium chloride 0.9 % 100 mL IVPB (1 g Intravenous New Bag/Given 08/01/17 1523)     Initial Impression / Assessment and Plan / ED Course  I have reviewed the triage vital signs and the nursing notes.  Pertinent labs & imaging results that were available during my care of the patient were reviewed by me and considered in my medical decision making (see chart for details).  Clinical Course as of Aug 01 1541  Fri Aug 01, 2017  1542 Laboratory tests are notable for urinalysis consistent with a urinary tract infection.  Patient's electrolyte panel is reassuring.  CBC does not show any leukocytosis   [JK]    Clinical Course User Index [JK] Linwood DibblesKnapp, Kita Neace, MD    Patient presented to the emergency room for evaluation of abdominal pain that resolved prior to my evaluation.  Patient has remained stable in the emergency room without any discomfort.  She has not had any vomiting.  I doubt diverticulitis, colitis, or other emergency condition.  Her urinalysis suggest a urinary tract infection.  Patient states she was given a new antibiotic yesterday.  She just saw her urologist to the other day and has good follow-up.  I think it is reasonable for her to continue her antibiotic regimen.  Follow-up with your urologist as planned.  Final Clinical Impressions(s) / ED Diagnoses   Final diagnoses:  Urinary tract infection with hematuria, site unspecified    ED Discharge Orders    None       Linwood DibblesKnapp, Rayanne Padmanabhan, MD 08/01/17 [phone]

## 2017-08-01 NOTE — ED Triage Notes (Signed)
RLQ pain that radiates to right flank, sudden onset yesterday. Seen by urology yesterday and pain has been persistent since. Reports chronic diarrhea and urinary frequency.

## 2017-08-01 NOTE — ED Notes (Signed)
Lab called to add on labs to hold tubes sent to lab earlier

## 2017-08-03 LAB — URINE CULTURE: Culture: NO GROWTH

## 2017-08-08 DIAGNOSIS — E119 Type 2 diabetes mellitus without complications: Secondary | ICD-10-CM | POA: Diagnosis not present

## 2017-08-20 DIAGNOSIS — R351 Nocturia: Secondary | ICD-10-CM | POA: Diagnosis not present

## 2017-08-20 DIAGNOSIS — N3946 Mixed incontinence: Secondary | ICD-10-CM | POA: Diagnosis not present

## 2017-08-20 DIAGNOSIS — N3000 Acute cystitis without hematuria: Secondary | ICD-10-CM | POA: Diagnosis not present

## 2017-08-20 DIAGNOSIS — N130 Hydronephrosis with ureteropelvic junction obstruction: Secondary | ICD-10-CM | POA: Diagnosis not present

## 2017-08-20 DIAGNOSIS — R350 Frequency of micturition: Secondary | ICD-10-CM | POA: Diagnosis not present

## 2017-08-21 DIAGNOSIS — N130 Hydronephrosis with ureteropelvic junction obstruction: Secondary | ICD-10-CM | POA: Diagnosis not present

## 2017-08-21 DIAGNOSIS — N200 Calculus of kidney: Secondary | ICD-10-CM | POA: Diagnosis not present

## 2017-08-27 ENCOUNTER — Encounter: Payer: Medicare Other | Admitting: Physical Medicine & Rehabilitation

## 2017-09-27 ENCOUNTER — Other Ambulatory Visit: Payer: Self-pay | Admitting: Physical Medicine & Rehabilitation

## 2017-10-03 DIAGNOSIS — R351 Nocturia: Secondary | ICD-10-CM | POA: Diagnosis not present

## 2017-10-03 DIAGNOSIS — N3946 Mixed incontinence: Secondary | ICD-10-CM | POA: Diagnosis not present

## 2017-10-29 DIAGNOSIS — Z23 Encounter for immunization: Secondary | ICD-10-CM | POA: Diagnosis not present

## 2017-10-29 DIAGNOSIS — E1140 Type 2 diabetes mellitus with diabetic neuropathy, unspecified: Secondary | ICD-10-CM | POA: Diagnosis not present

## 2017-10-29 DIAGNOSIS — Z6830 Body mass index (BMI) 30.0-30.9, adult: Secondary | ICD-10-CM | POA: Diagnosis not present

## 2017-10-29 DIAGNOSIS — R351 Nocturia: Secondary | ICD-10-CM | POA: Diagnosis not present

## 2017-11-05 ENCOUNTER — Other Ambulatory Visit: Payer: Self-pay | Admitting: Physical Medicine & Rehabilitation

## 2018-01-05 DIAGNOSIS — Z6831 Body mass index (BMI) 31.0-31.9, adult: Secondary | ICD-10-CM | POA: Diagnosis not present

## 2018-01-05 DIAGNOSIS — K219 Gastro-esophageal reflux disease without esophagitis: Secondary | ICD-10-CM | POA: Diagnosis not present

## 2018-01-05 DIAGNOSIS — E6609 Other obesity due to excess calories: Secondary | ICD-10-CM | POA: Diagnosis not present

## 2018-01-05 DIAGNOSIS — E7800 Pure hypercholesterolemia, unspecified: Secondary | ICD-10-CM | POA: Diagnosis not present

## 2018-01-22 DIAGNOSIS — Z0000 Encounter for general adult medical examination without abnormal findings: Secondary | ICD-10-CM | POA: Diagnosis not present

## 2018-01-22 DIAGNOSIS — Z1389 Encounter for screening for other disorder: Secondary | ICD-10-CM | POA: Diagnosis not present

## 2018-02-03 DIAGNOSIS — R350 Frequency of micturition: Secondary | ICD-10-CM | POA: Diagnosis not present

## 2018-02-03 DIAGNOSIS — R351 Nocturia: Secondary | ICD-10-CM | POA: Diagnosis not present

## 2018-05-05 DIAGNOSIS — N3946 Mixed incontinence: Secondary | ICD-10-CM | POA: Diagnosis not present

## 2018-05-05 DIAGNOSIS — R351 Nocturia: Secondary | ICD-10-CM | POA: Diagnosis not present

## 2018-05-18 ENCOUNTER — Other Ambulatory Visit: Payer: Self-pay | Admitting: Physical Medicine & Rehabilitation

## 2018-08-12 DIAGNOSIS — Z6834 Body mass index (BMI) 34.0-34.9, adult: Secondary | ICD-10-CM | POA: Diagnosis not present

## 2018-08-12 DIAGNOSIS — E1140 Type 2 diabetes mellitus with diabetic neuropathy, unspecified: Secondary | ICD-10-CM | POA: Diagnosis not present

## 2018-08-12 DIAGNOSIS — N390 Urinary tract infection, site not specified: Secondary | ICD-10-CM | POA: Diagnosis not present

## 2018-08-12 DIAGNOSIS — E039 Hypothyroidism, unspecified: Secondary | ICD-10-CM | POA: Diagnosis not present

## 2018-08-14 ENCOUNTER — Other Ambulatory Visit: Payer: Self-pay | Admitting: Physical Medicine & Rehabilitation

## 2018-08-17 DIAGNOSIS — E113293 Type 2 diabetes mellitus with mild nonproliferative diabetic retinopathy without macular edema, bilateral: Secondary | ICD-10-CM | POA: Diagnosis not present

## 2018-08-17 DIAGNOSIS — H04123 Dry eye syndrome of bilateral lacrimal glands: Secondary | ICD-10-CM | POA: Diagnosis not present

## 2018-08-18 DIAGNOSIS — H524 Presbyopia: Secondary | ICD-10-CM | POA: Diagnosis not present

## 2018-10-20 DIAGNOSIS — Z23 Encounter for immunization: Secondary | ICD-10-CM | POA: Diagnosis not present

## 2019-01-25 DIAGNOSIS — I10 Essential (primary) hypertension: Secondary | ICD-10-CM | POA: Diagnosis not present

## 2019-01-25 DIAGNOSIS — Z0000 Encounter for general adult medical examination without abnormal findings: Secondary | ICD-10-CM | POA: Diagnosis not present

## 2019-01-25 DIAGNOSIS — E039 Hypothyroidism, unspecified: Secondary | ICD-10-CM | POA: Diagnosis not present

## 2019-01-25 DIAGNOSIS — E7800 Pure hypercholesterolemia, unspecified: Secondary | ICD-10-CM | POA: Diagnosis not present

## 2019-02-11 ENCOUNTER — Other Ambulatory Visit: Payer: Self-pay | Admitting: Physical Medicine & Rehabilitation

## 2019-02-12 DIAGNOSIS — Z20822 Contact with and (suspected) exposure to covid-19: Secondary | ICD-10-CM | POA: Diagnosis not present

## 2019-02-12 DIAGNOSIS — J3489 Other specified disorders of nose and nasal sinuses: Secondary | ICD-10-CM | POA: Diagnosis not present

## 2019-02-12 DIAGNOSIS — J029 Acute pharyngitis, unspecified: Secondary | ICD-10-CM | POA: Diagnosis not present

## 2019-02-13 DIAGNOSIS — Z20828 Contact with and (suspected) exposure to other viral communicable diseases: Secondary | ICD-10-CM | POA: Diagnosis not present

## 2019-02-13 DIAGNOSIS — J029 Acute pharyngitis, unspecified: Secondary | ICD-10-CM | POA: Diagnosis not present

## 2019-02-13 DIAGNOSIS — J3489 Other specified disorders of nose and nasal sinuses: Secondary | ICD-10-CM | POA: Diagnosis not present

## 2019-02-14 ENCOUNTER — Telehealth: Payer: Self-pay | Admitting: Unknown Physician Specialty

## 2019-02-14 ENCOUNTER — Other Ambulatory Visit: Payer: Self-pay | Admitting: Unknown Physician Specialty

## 2019-02-14 DIAGNOSIS — U071 COVID-19: Secondary | ICD-10-CM

## 2019-02-14 NOTE — Telephone Encounter (Signed)
  I connected by phone with [name] on 02/14/2019 at 4:44 PM to discuss the potential use of an new treatment for mild to moderate COVID-19 viral infection in non-hospitalized patients.  This patient is a 86 y.o. female that meets the FDA criteria for Emergency Use Authorization of bamlanivimab or casirivimab\imdevimab.  Has a (+) direct SARS-CoV-2 viral test result  Has mild or moderate COVID-19   Is ? 84 years of age and weighs ? 40 kg  Is NOT hospitalized due to COVID-19  Is NOT requiring oxygen therapy or requiring an increase in baseline oxygen flow rate due to COVID-19  Is within 10 days of symptom onset  Has at least one of the high risk factor(s) for progression to severe COVID-19 and/or hospitalization as defined in EUA.  Specific high risk criteria : >/= 84 yo and co-morbid conditions treated by PCP   I have spoken and communicated the following to the patient or parent/caregiver:  1. FDA has authorized the emergency use of bamlanivimab and casirivimab\imdevimab for the treatment of mild to moderate COVID-19 in adults and pediatric patients with positive results of direct SARS-CoV-2 viral testing who are 8 years of age and older weighing at least 40 kg, and who are at high risk for progressing to severe COVID-19 and/or hospitalization.  2. The significant known and potential risks and benefits of bamlanivimab and casirivimab\imdevimab, and the extent to which such potential risks and benefits are unknown.  3. Information on available alternative treatments and the risks and benefits of those alternatives, including clinical trials.  4. Patients treated with bamlanivimab and casirivimab\imdevimab should continue to self-isolate and use infection control measures (e.g., wear mask, isolate, social distance, avoid sharing personal items, clean and disinfect "high touch" surfaces, and frequent handwashing) according to CDC guidelines.   5. The patient or parent/caregiver  has the option to accept or refuse bamlanivimab or casirivimab\imdevimab .  After reviewing this information with the patient, The patient agreed to proceed with receiving the bamlanimivab infusion and will be provided a copy of the Fact sheet prior to receiving the infusion.Gabriel Cirri 02/14/2019 4:44 PM  Sx onset 1/4

## 2019-02-17 ENCOUNTER — Ambulatory Visit (HOSPITAL_COMMUNITY)
Admission: RE | Admit: 2019-02-17 | Discharge: 2019-02-17 | Disposition: A | Discharge status: Home / Self Care | Payer: Medicare Other | Source: Ambulatory Visit | Attending: Pulmonary Disease | Admitting: Pulmonary Disease

## 2019-02-17 DIAGNOSIS — U071 COVID-19: Secondary | ICD-10-CM

## 2019-02-17 DIAGNOSIS — Z23 Encounter for immunization: Secondary | ICD-10-CM | POA: Diagnosis not present

## 2019-02-17 MED ORDER — SODIUM CHLORIDE 0.9 % IV SOLN
INTRAVENOUS | Status: DC | PRN
  Administered 2019-02-17: 13:00:00 250 mL via INTRAVENOUS

## 2019-02-17 MED ORDER — DIPHENHYDRAMINE HCL 50 MG/ML IJ SOLN: 50.0000 mg | Freq: Once | INTRAMUSCULAR | Status: DC | PRN

## 2019-02-17 MED ORDER — SODIUM CHLORIDE 0.9 % IV SOLN
700.0000 mg | Freq: Once | INTRAVENOUS | Status: AC
  Administered 2019-02-17: 700 mg via INTRAVENOUS
  Filled 2019-02-17: qty 20

## 2019-02-17 MED ORDER — EPINEPHRINE 0.3 MG/0.3ML IJ SOAJ: 0.3000 mg | Freq: Once | INTRAMUSCULAR | Status: DC | PRN

## 2019-02-17 MED ORDER — ALBUTEROL SULFATE HFA 108 (90 BASE) MCG/ACT IN AERS: 2.0000 | INHALATION_SPRAY | Freq: Once | RESPIRATORY_TRACT | Status: DC | PRN

## 2019-02-17 MED ORDER — FAMOTIDINE IN NACL 20-0.9 MG/50ML-% IV SOLN: 20.0000 mg | Freq: Once | INTRAVENOUS | Status: DC | PRN

## 2019-02-17 MED ORDER — METHYLPREDNISOLONE SODIUM SUCC 125 MG IJ SOLR: 125.0000 mg | Freq: Once | INTRAMUSCULAR | Status: DC | PRN

## 2019-02-17 NOTE — Discharge Instructions (Signed)

## 2019-02-17 NOTE — Progress Notes (Signed)
  Diagnosis: COVID-19  Physician: Dr Joya Gaskins  Procedure: [facility] Med: bamlanivimab infusion - Provided patient with bamlanimivab fact sheet for patients, parents and caregivers prior to infusion.  Complications: No immediate complications noted.  Discharge: Discharged home   Hannah Fischer, Hannah Fischer 02/17/2019

## 2019-03-01 DIAGNOSIS — E113292 Type 2 diabetes mellitus with mild nonproliferative diabetic retinopathy without macular edema, left eye: Secondary | ICD-10-CM | POA: Diagnosis not present

## 2019-03-01 DIAGNOSIS — H35372 Puckering of macula, left eye: Secondary | ICD-10-CM | POA: Diagnosis not present

## 2019-03-01 DIAGNOSIS — H04123 Dry eye syndrome of bilateral lacrimal glands: Secondary | ICD-10-CM | POA: Diagnosis not present

## 2019-03-03 DIAGNOSIS — R220 Localized swelling, mass and lump, head: Secondary | ICD-10-CM | POA: Diagnosis not present

## 2019-03-03 DIAGNOSIS — Z6834 Body mass index (BMI) 34.0-34.9, adult: Secondary | ICD-10-CM | POA: Diagnosis not present

## 2019-03-15 ENCOUNTER — Emergency Department (HOSPITAL_BASED_OUTPATIENT_CLINIC_OR_DEPARTMENT_OTHER)
Admission: EM | Admit: 2019-03-15 | Discharge: 2019-03-15 | Disposition: A | Discharge status: Home / Self Care | Payer: Medicare Other | Attending: Emergency Medicine | Admitting: Emergency Medicine

## 2019-03-15 ENCOUNTER — Emergency Department (HOSPITAL_BASED_OUTPATIENT_CLINIC_OR_DEPARTMENT_OTHER): Payer: Medicare Other

## 2019-03-15 ENCOUNTER — Other Ambulatory Visit: Payer: Self-pay

## 2019-03-15 ENCOUNTER — Encounter (HOSPITAL_BASED_OUTPATIENT_CLINIC_OR_DEPARTMENT_OTHER): Payer: Self-pay | Admitting: *Deleted

## 2019-03-15 DIAGNOSIS — Z885 Allergy status to narcotic agent status: Secondary | ICD-10-CM | POA: Diagnosis not present

## 2019-03-15 DIAGNOSIS — Z8673 Personal history of transient ischemic attack (TIA), and cerebral infarction without residual deficits: Secondary | ICD-10-CM | POA: Insufficient documentation

## 2019-03-15 DIAGNOSIS — R0602 Shortness of breath: Secondary | ICD-10-CM | POA: Diagnosis not present

## 2019-03-15 DIAGNOSIS — R001 Bradycardia, unspecified: Secondary | ICD-10-CM | POA: Diagnosis not present

## 2019-03-15 DIAGNOSIS — Z79899 Other long term (current) drug therapy: Secondary | ICD-10-CM | POA: Diagnosis not present

## 2019-03-15 DIAGNOSIS — E119 Type 2 diabetes mellitus without complications: Secondary | ICD-10-CM | POA: Diagnosis not present

## 2019-03-15 DIAGNOSIS — Z20822 Contact with and (suspected) exposure to covid-19: Secondary | ICD-10-CM | POA: Diagnosis not present

## 2019-03-15 DIAGNOSIS — Z7902 Long term (current) use of antithrombotics/antiplatelets: Secondary | ICD-10-CM | POA: Diagnosis not present

## 2019-03-15 DIAGNOSIS — E785 Hyperlipidemia, unspecified: Secondary | ICD-10-CM | POA: Diagnosis not present

## 2019-03-15 DIAGNOSIS — Z86711 Personal history of pulmonary embolism: Secondary | ICD-10-CM | POA: Diagnosis not present

## 2019-03-15 DIAGNOSIS — I10 Essential (primary) hypertension: Secondary | ICD-10-CM | POA: Diagnosis not present

## 2019-03-15 DIAGNOSIS — Z882 Allergy status to sulfonamides status: Secondary | ICD-10-CM | POA: Diagnosis not present

## 2019-03-15 DIAGNOSIS — E039 Hypothyroidism, unspecified: Secondary | ICD-10-CM | POA: Diagnosis not present

## 2019-03-15 DIAGNOSIS — R05 Cough: Secondary | ICD-10-CM | POA: Diagnosis not present

## 2019-03-15 DIAGNOSIS — Z888 Allergy status to other drugs, medicaments and biological substances status: Secondary | ICD-10-CM | POA: Insufficient documentation

## 2019-03-15 DIAGNOSIS — N1330 Unspecified hydronephrosis: Secondary | ICD-10-CM | POA: Diagnosis not present

## 2019-03-15 LAB — CBC WITH DIFFERENTIAL/PLATELET
Abs Immature Granulocytes: 0.01 10*3/uL (ref 0.00–0.07)
Basophils Absolute: 0 10*3/uL (ref 0.0–0.1)
Basophils Relative: 1 %
Eosinophils Absolute: 0.1 10*3/uL (ref 0.0–0.5)
Eosinophils Relative: 3 %
HCT: 37.6 % (ref 36.0–46.0)
Hemoglobin: 12 g/dL (ref 12.0–15.0)
Immature Granulocytes: 0 %
Lymphocytes Relative: 26 %
Lymphs Abs: 1.1 10*3/uL (ref 0.7–4.0)
MCH: 32.6 pg (ref 26.0–34.0)
MCHC: 31.9 g/dL (ref 30.0–36.0)
MCV: 102.2 fL — ABNORMAL HIGH (ref 80.0–100.0)
Monocytes Absolute: 0.5 10*3/uL (ref 0.1–1.0)
Monocytes Relative: 11 %
Neutro Abs: 2.5 10*3/uL (ref 1.7–7.7)
Neutrophils Relative %: 59 %
Platelets: 201 10*3/uL (ref 150–400)
RBC: 3.68 MIL/uL — ABNORMAL LOW (ref 3.87–5.11)
RDW: 13.7 % (ref 11.5–15.5)
WBC: 4.2 10*3/uL (ref 4.0–10.5)
nRBC: 0 % (ref 0.0–0.2)

## 2019-03-15 LAB — COMPREHENSIVE METABOLIC PANEL
ALT: 12 U/L (ref 0–44)
AST: 20 U/L (ref 15–41)
Albumin: 3.3 g/dL — ABNORMAL LOW (ref 3.5–5.0)
Alkaline Phosphatase: 65 U/L (ref 38–126)
Anion gap: 8 (ref 5–15)
BUN: 16 mg/dL (ref 8–23)
CO2: 27 mmol/L (ref 22–32)
Calcium: 9.1 mg/dL (ref 8.9–10.3)
Chloride: 105 mmol/L (ref 98–111)
Creatinine, Ser: 0.94 mg/dL (ref 0.44–1.00)
GFR calc Af Amer: >60 mL/min (ref >60)
GFR calc non Af Amer: 56 mL/min — ABNORMAL LOW (ref >60)
Glucose, Bld: 120 mg/dL — ABNORMAL HIGH (ref 70–99)
Potassium: 3.9 mmol/L (ref 3.5–5.1)
Sodium: 140 mmol/L (ref 135–145)
Total Bilirubin: 1 mg/dL (ref 0.3–1.2)
Total Protein: 6 g/dL — ABNORMAL LOW (ref 6.5–8.1)

## 2019-03-15 MED ORDER — IOHEXOL 350 MG/ML SOLN
100.0000 mL | Freq: Once | INTRAVENOUS | Status: AC | PRN
  Administered 2019-03-15: 100 mL via INTRAVENOUS

## 2019-03-15 NOTE — ED Notes (Signed)
ED Provider at bedside. 

## 2019-03-15 NOTE — Discharge Instructions (Signed)
It is important for you to follow up with your kidney doctor to discuss the right kidney hydronephrosis that was seen on today's picture.

## 2019-03-15 NOTE — ED Triage Notes (Signed)
Sob x 3 weeks. Headache, cough with chest soreness. She had Covid 4 weeks ago.

## 2019-03-15 NOTE — ED Provider Notes (Signed)
[facility] EMERGENCY DEPARTMENT Provider Note   CSN: [id number] Arrival date & time: 03/15/19  1103     History Chief Complaint  Patient presents with  . Shortness of Breath    Hannah Fischer is a 86 y.o. female.  86 year old female with past medical history including hypertension, hyperlipidemia, type 2 diabetes mellitus, PE who presents with shortness of breath.  Patient was diagnosed with COVID-19 approximately 1 month ago.  She received outpatient infusion therapy for it.  She continues to have a cough and states that for the past 3 weeks she has had persistent shortness of breath that has not improved.  Shortness of breath is relatively constant.  She reports some chest soreness with coughing as well as with taking a breath in.  She denies any recent fevers or vomiting.  No leg swelling or pain.  The history is provided by the patient.  Shortness of Breath      Past Medical History:  Diagnosis Date  . Abnormal nuclear stress test    2005, normal cath Dr. Elease Hashimoto, normal stress test 2012 Dr. Eldridge Dace  . Anemia    chronic  . Anxiety   . Arthritis    djd  . Blood transfusion 1979   for anemia  . Chest pain    evaluated @ [facility] cardiology; had a stress test 2012  . Complication of anesthesia    unable to breathe lying  flat on back  . Diabetes mellitus    type 2  niddm x 25 yrs  . Diverticular disease   . DJD (degenerative joint disease)    lumbar and cervical  . GERD (gastroesophageal reflux disease)   . Hyperlipemia   . Hypertension    on medication  . Hypothyroidism    takes levoxyl  . Irritable bowel syndrome   . 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 Neuromuscular disorder (HCC)   . Neuropathy in diabetes (HCC)   . Other and unspecified angina pectoris    tests came back NOT heart related  . Pulmonary embolism (HCC) 06-2010   bilateral  . Seizures (HCC)    once, age 89    Patient Active Problem List   Diagnosis Date Noted  .  Arthritis of shoulder region, left, degenerative 12/03/2013  . Nonspecific abnormal electrocardiogram (ECG) (EKG) 10/15/2013  . Other and unspecified angina pectoris 10/15/2013  . TIA (transient ischemic attack) 11/15/2011  . HTN (hypertension) 11/15/2011  . Diabetes mellitus (HCC) 11/15/2011  . Hypothyroidism 11/15/2011  . Dyslipidemia 11/15/2011  . GERD (gastroesophageal reflux disease) 11/15/2011  . Pyuria 11/15/2011    Past Surgical History:  Procedure Laterality Date  . BACK SURGERY    . BLEPHAROPLASTY  2012   rt eye  . CARDIAC CATHETERIZATION     had one 10/2013  . CHOLECYSTECTOMY    . EYE SURGERY  2007   cataract ext/ iol rt eye  . JOINT REPLACEMENT  2001,2005   both knees  . LEFT HEART CATHETERIZATION WITH CORONARY ANGIOGRAM N/A 10/18/2013   Procedure: LEFT HEART CATHETERIZATION WITH CORONARY ANGIOGRAM;  Surgeon: Corky Crafts, MD;  Location: [facility] CATH LAB;  Service: Cardiovascular;  Laterality: N/A;  . LUMBAR DISC SURGERY  1990  . REVERSE SHOULDER ARTHROPLASTY Left 12/03/2013   Procedure: LEFT SHOULDER REVERSE ARTHROPLASTY;  Surgeon: Verlee Rossetti, MD;  Location: [facility] OR;  Service: Orthopedics;  Laterality: Left;  . SHOULDER HEMI-ARTHROPLASTY  12/25/2010   Procedure: SHOULDER HEMI-ARTHROPLASTY;  Surgeon: Corrie Mckusick  Marlou Sa;  Location: [place];  Service: Orthopedics;  Laterality: Right;     OB History   No obstetric history on file.     Family History  Problem Relation Age of Onset  . Diabetes Mother   . Heart disease Mother   . Heart disease Sister   . Heart disease Brother     Social History   Tobacco Use  . Smoking status: Never Smoker  . Smokeless tobacco: Never Used  Substance Use Topics  . Alcohol use: No  . Drug use: No    Home Medications Prior to Admission medications   Medication Sig Start Date End Date Taking? Authorizing Provider  ALPRAZolam Duanne Moron) 0.25 MG tablet Take 0.25 mg by mouth at bedtime as needed for sleep. 07/02/17   [provider]  cephALEXin (KEFLEX) 500 MG capsule Take 1 capsule (500 mg total) by mouth 2 (two) times daily. Patient not taking: Reported on 08/01/2017 05/17/17   Molpus, John, MD  Cholecalciferol (VITAMIN D3) 2000 UNITS TABS Take 2,000 Units by mouth daily.     [provider]  ciprofloxacin (CIPRO) 250 MG tablet Take 250 mg by mouth 2 (two) times daily. 07/31/17   [provider]  clopidogrel (PLAVIX) 75 MG tablet Take 1 tablet (75 mg total) by mouth daily with breakfast. 11/17/11   Barton Dubois, MD  Cyanocobalamin (VITAMIN B-12 PO) Take 1 tablet by mouth daily.     [provider]  DULoxetine (CYMBALTA) 30 MG capsule TAKE 1 CAPSULE BY MOUTH EVERY DAY 08/14/18   Jamse Arn, MD  gabapentin (NEURONTIN) 300 MG capsule TAKE 1 CAPSULE(300 MG) BY MOUTH THREE TIMES DAILY Patient taking differently: Take 300 mg by mouth at bedtime 02/20/17   Jamse Arn, MD  levothyroxine (SYNTHROID, LEVOTHROID) 100 MCG tablet Take 1 tablet (100 mcg total) by mouth daily. 11/17/11   Barton Dubois, MD  losartan (COZAAR) 50 MG tablet Take 100 mg by mouth daily.  08/29/13   [provider]  metFORMIN (GLUCOPHAGE) 1000 MG tablet Take 1,000 mg by mouth 2 (two) times daily with a meal.    [provider]  methocarbamol (ROBAXIN) 500 MG tablet Take 1 tablet (500 mg total) by mouth every 8 (eight) hours as needed for muscle spasms. 08/29/16   Jamse Arn, MD  Omega-3 Fatty Acids (FISH OIL) 1000 MG CAPS Take 1 capsule by mouth 2 (two) times daily.     [provider]  pioglitazone (ACTOS) 45 MG tablet Take 45 mg by mouth daily.      [provider]  propranolol (INDERAL) 40 MG tablet Take 40 mg by mouth daily.  04/13/17   [provider]  traZODone (DESYREL) 50 MG tablet TAKE 1 TABLET BY MOUTH AT BEDTIME 07/01/17   Jamse Arn, MD    Allergies    Codeine, Sulfites, and Zocor [simvastatin]  Review of Systems   Review of Systems    Respiratory: Positive for shortness of breath.    All other systems reviewed and are negative except that which was mentioned in HPI  Physical Exam Updated Vital Signs BP (!) 166/71 Comment: rm air  Pulse (!) 49 Comment: rm air  Temp 97.6 F (36.4 C) (Oral)   Resp 18 Comment: rm air  Ht 5\' 3"  (1.6 m)   Wt 81.6 kg   SpO2 100% Comment: rm air  BMI 31.89 kg/m   Physical Exam Vitals and nursing note reviewed.  Constitutional:      General:  She is not in acute distress.    Appearance: She is well-developed.  HENT:     Head: Normocephalic and atraumatic.  Eyes:     Conjunctiva/sclera: Conjunctivae normal.  Cardiovascular:     Rate and Rhythm: Normal rate and regular rhythm.     Heart sounds: Normal heart sounds. No murmur.  Pulmonary:     Effort: Pulmonary effort is normal.     Breath sounds: Normal breath sounds.  Abdominal:     General: There is no distension.     Palpations: Abdomen is soft.     Tenderness: There is no abdominal tenderness.  Musculoskeletal:     Cervical back: Neck supple.     Right lower leg: No tenderness. No edema.     Left lower leg: No tenderness. No edema.  Skin:    General: Skin is warm and dry.  Neurological:     Mental Status: She is alert and oriented to person, place, and time.     Comments: Fluent speech  Psychiatric:        Judgment: Judgment normal.     ED Results / Procedures / Treatments   Labs (all labs ordered are listed, but only abnormal results are displayed) Labs Reviewed  COMPREHENSIVE METABOLIC PANEL - Abnormal; Notable for the following components:      Result Value   Glucose, Bld 120 (*)    Total Protein 6.0 (*)    Albumin 3.3 (*)    GFR calc non Af Amer 56 (*)    All other components within normal limits  CBC WITH DIFFERENTIAL/PLATELET - Abnormal; Notable for the following components:   RBC 3.68 (*)    MCV 102.2 (*)    All other components within normal limits    EKG EKG Interpretation  Date/Time:  Monday  March 15 2019 12:39:49 EST Ventricular Rate:  49 PR Interval:    QRS Duration: 103 QT Interval:  493 QTC Calculation: 446 R Axis:   -10 Text Interpretation: Sinus bradycardia Anteroseptal infarct, old rate slower compared to previous Confirmed by Frederick Peers [phone]) on 03/15/2019 12:44:15 PM   Radiology CT Angio Chest PE W/Cm &/Or Wo Cm  Result Date: 03/15/2019 CLINICAL DATA:  Shortness of breath. History of pulmonary embolus. Symptoms for 3 weeks. Headache, cough, chest soreness. Covid-19 4 weeks ago. EXAM: CT ANGIOGRAPHY CHEST WITH CONTRAST TECHNIQUE: Multidetector CT imaging of the chest was performed using the standard protocol during bolus administration of intravenous contrast. Multiplanar CT image reconstructions and MIPs were obtained to evaluate the vascular anatomy. CONTRAST:  OMNIPAQUE IOHEXOL 350 MG/ML SOLN COMPARISON:  CT angio chest on 08/27/2013 FINDINGS: Cardiovascular: Heart is enlarged. There is dense atherosclerotic calcification headache mitral annulus and coronary arteries. No pericardial effusion. Moderate atherosclerotic calcification of the thoracic aorta. No associated aneurysm. Pulmonary arteries are well opacified. There is no acute pulmonary embolus. Stable appearance in a kink at the origin of the RIGHT pulmonary artery, associated with poststenotic dilatation. The RIGHT pulmonary artery is 3.7 centimeters. Mediastinum/Nodes: There is mild diffuse thickening of the esophagus, consistent with esophagitis. The visualized portion of the thyroid gland has a normal appearance. No significant mediastinal, hilar, or axillary adenopathy. Lungs/Pleura: No pulmonary nodule or pleural effusion. There is minimal atelectasis adjacent to a small fat containing diaphragmatic hernia at the posterior aspect of the RIGHT lung base. Upper Abdomen: Status post cholecystectomy. There is moderate RIGHT hydronephrosis, new since prior studies. Scattered colonic diverticula.  Musculoskeletal: Degenerative changes are seen in the UPPER lumbar spine.  Review of the MIP images confirms the above findings. IMPRESSION: 1. Technically adequate exam showing no acute pulmonary embolus. 2. Enlarged RIGHT pulmonary artery. 3. Cardiomegaly and coronary artery disease. 4. Diffuse thickening of the esophagus, consistent with esophagitis. 5. Moderate RIGHT hydronephrosis, new since prior studies. Recommend further evaluation with CT of the abdomen and pelvis with intravenous contrast unless contraindicated. 6. Colonic diverticulosis. 7. Status post cholecystectomy. 8.  Aortic atherosclerosis.  (ICD10-I70.0) Electronically Signed   By: Norva Pavlov M.D.   On: 03/15/2019 13:56   DG Chest Portable 1 View  Result Date: 03/15/2019 CLINICAL DATA:  Shortness of breath for 3 weeks, headache, cough, chest soreness, had COVID-19 4 weeks ago, past history diabetes mellitus, hypertension, GERD, pulmonary embolism EXAM: PORTABLE CHEST 1 VIEW COMPARISON:  Portable exam 1227 hours compared to 12/20/2010 FINDINGS: Borderline enlargement of cardiac silhouette. Mediastinal contours and pulmonary vascularity normal. Atherosclerotic calcification aorta. Bronchitic changes without infiltrate, pleural effusion, or pneumothorax. Bones demineralized with BILATERAL shoulder prostheses noted. IMPRESSION: Bronchitic changes without infiltrate. Electronically Signed   By: Ulyses Southward M.D.   On: 03/15/2019 12:42    Procedures Procedures (including critical care time)  Medications Ordered in ED Medications  iohexol (OMNIPAQUE) 350 MG/ML injection 100 mL (100 mLs Intravenous Contrast Given 03/15/19 1332)    ED Course  I have reviewed the triage vital signs and the nursing notes.  Pertinent labs & imaging results that were available during my care of the patient were reviewed by me and considered in my medical decision making (see chart for details).    MDM Rules/Calculators/A&P                      Patient  breathing comfortably on exam, clear breath sounds, 100% on room air.  Differential includes post Covid lung injury, PE. Basic labs unremarkable. Obtain CTA of chest which was negative for PE, edema, or infiltrate.  She did have incidental finding of moderate right hydronephrosis.  Patient reports to me that she has history of congenital right kidney abnormality. She follows w/ a nephrologist. Her creatinine today is normal. She denies flank pain, urinary symptoms, or abdominal pain therefore I feel she can follow up with her nephrologist about this finding. Regarding her SOB, I suspect she is still recovering from COVID-19 and I discussed the wide variability in how long people take to return to normal. I have extensively reviewed return precautions regarding SOB or any new flank pain/urinary symptoms. She voiced understanding.   MARRIAN BELLS was evaluated in Emergency Department on 03/15/2019 for the symptoms described in the history of present illness. She was evaluated in the context of the global COVID-19 pandemic, which necessitated consideration that the patient might be at risk for infection with the SARS-CoV-2 virus that causes COVID-19. Institutional protocols and algorithms that pertain to the evaluation of patients at risk for COVID-19 are in a state of rapid change based on information released by regulatory bodies including the CDC and federal and state organizations. These policies and algorithms were followed during the patient's care in the ED.  Final Clinical Impression(s) / ED Diagnoses Final diagnoses:  None    Rx / DC Orders ED Discharge Orders    None       Tarence Searcy, Ambrose Finland, MD 03/15/19 [phone]

## 2019-03-15 NOTE — ED Notes (Signed)
Assisted to bedside commode with steady gait.

## 2019-03-19 DIAGNOSIS — N3946 Mixed incontinence: Secondary | ICD-10-CM | POA: Diagnosis not present

## 2019-03-19 DIAGNOSIS — N130 Hydronephrosis with ureteropelvic junction obstruction: Secondary | ICD-10-CM | POA: Diagnosis not present

## 2019-04-02 DIAGNOSIS — N1330 Unspecified hydronephrosis: Secondary | ICD-10-CM | POA: Diagnosis not present

## 2019-04-02 DIAGNOSIS — N132 Hydronephrosis with renal and ureteral calculous obstruction: Secondary | ICD-10-CM | POA: Diagnosis not present

## 2019-04-12 ENCOUNTER — Encounter: Payer: Medicare Other | Attending: Physical Medicine & Rehabilitation | Admitting: Physical Medicine & Rehabilitation

## 2019-04-12 ENCOUNTER — Other Ambulatory Visit: Payer: Self-pay

## 2019-04-12 ENCOUNTER — Encounter: Payer: Self-pay | Admitting: Physical Medicine & Rehabilitation

## 2019-04-12 VITALS — BP 173/75 | HR 49 | Temp 98.8°F | Ht 59.0 in | Wt 181.0 lb

## 2019-04-12 DIAGNOSIS — G8929 Other chronic pain: Secondary | ICD-10-CM

## 2019-04-12 DIAGNOSIS — R269 Unspecified abnormalities of gait and mobility: Secondary | ICD-10-CM

## 2019-04-12 DIAGNOSIS — G894 Chronic pain syndrome: Secondary | ICD-10-CM | POA: Diagnosis not present

## 2019-04-12 DIAGNOSIS — M791 Myalgia, unspecified site: Secondary | ICD-10-CM | POA: Diagnosis not present

## 2019-04-12 DIAGNOSIS — M545 Low back pain, unspecified: Secondary | ICD-10-CM

## 2019-04-12 HISTORY — DX: Other chronic pain: G89.29

## 2019-04-12 HISTORY — DX: Low back pain, unspecified: M54.50

## 2019-04-12 MED ORDER — DULOXETINE HCL 30 MG PO CPEP: 30.0000 mg | ORAL_CAPSULE | Freq: Every day | ORAL | 1 refills | Status: DC

## 2019-04-12 MED ORDER — METHOCARBAMOL 500 MG PO TABS: 500.0000 mg | ORAL_TABLET | Freq: Three times a day (TID) | ORAL | 1 refills | Status: DC | PRN

## 2019-04-12 NOTE — Progress Notes (Addendum)
Subjective:    Patient ID: Hannah Fischer, female    DOB: 06-22-1932, 86 y.o.   MRN: [id number]  HPI Female with pmh/psh of seizures, PE, DM with neuropathy, DJD, solitary kidney, TIA, left shoulder OA, lumbar spinal stensosis, b/l shoulder surgery presents for follow up for low back pain.  Initially stated: Started ~2012.   Denies inciting event.  Stable, but exacerbated after fall.  Meds improve the pain.  Standing, ambulation exacerbate the pain.  Achy pain.  Constant.  Radiates at times to left calf.  Has not tried anything.  Associated LLE weakness, numbness, tingling.  Pain prevents from doing ADLs.  2-3 falls in the last year.  She was prescribed Xanax for sleep by PCP.  Last clinic visit 02/26/2017.  Since that time, she went to the ED for SOB last month, notes reviewed. She states she ran out of pads for her TEN unit. She is not using Robaxin. She is not taking Gabapentin. She is no longer using Cymbalta. Denies falls.  Pain Inventory Average Pain 1 Pain Right Now 1 My pain is aching  In the last 24 hours, has pain interfered with the following? General activity 0 Relation with others 0 Enjoyment of life 0 What TIME of day is your pain at its worst? na Sleep (in general) Fair  Pain is worse with: standing Pain improves with: medication Relief from Meds: 7  Mobility walk with assistance use a cane ability to climb steps?  yes do you drive?  yes transfers alone  Function retired  Neuro/Psych bladder control problems bowel control problems anxiety  Prior Studies Any changes since last visit?  no CT/MRI  Physicians involved in your care Primary care Dr. Sigmund Hazel   Family History  Problem Relation Age of Onset  . Diabetes Mother   . Heart disease Mother   . Heart disease Sister   . Heart disease Brother    Social History   Socioeconomic History  . Marital status: Married    Spouse name: Not on file  . Number of children: Not on file  . Years of  education: Not on file  . Highest education level: Not on file  Occupational History  . Not on file  Tobacco Use  . Smoking status: Never Smoker  . Smokeless tobacco: Never Used  Substance and Sexual Activity  . Alcohol use: No  . Drug use: No  . Sexual activity: Never    Birth control/protection: Post-menopausal  Other Topics Concern  . Not on file  Social History Narrative  . Not on file   Social Determinants of Health   Financial Resource Strain:   . Difficulty of Paying Living Expenses: Not on file  Food Insecurity:   . Worried About Programme researcher, broadcasting/film/video in the Last Year: Not on file  . Ran Out of Food in the Last Year: Not on file  Transportation Needs:   . Lack of Transportation (Medical): Not on file  . Lack of Transportation (Non-Medical): Not on file  Physical Activity:   . Days of Exercise per Week: Not on file  . Minutes of Exercise per Session: Not on file  Stress:   . Feeling of Stress : Not on file  Social Connections:   . Frequency of Communication with Friends and Family: Not on file  . Frequency of Social Gatherings with Friends and Family: Not on file  . Attends Religious Services: Not on file  . Active Member of Clubs or Organizations: Not  on file  . Attends Archivist Meetings: Not on file  . Marital Status: Not on file   Past Surgical History:  Procedure Laterality Date  . BACK SURGERY    . BLEPHAROPLASTY  2012   rt eye  . CARDIAC CATHETERIZATION     had one 10/2013  . CHOLECYSTECTOMY    . EYE SURGERY  2007   cataract ext/ iol rt eye  . JOINT REPLACEMENT  2001,2005   both knees  . LEFT HEART CATHETERIZATION WITH CORONARY ANGIOGRAM N/A 10/18/2013   Procedure: LEFT HEART CATHETERIZATION WITH CORONARY ANGIOGRAM;  Surgeon: Jettie Booze, MD;  Location: [facility] CATH LAB;  Service: Cardiovascular;  Laterality: N/A;  . LUMBAR [place]  . REVERSE SHOULDER ARTHROPLASTY Left 12/03/2013   Procedure: LEFT SHOULDER REVERSE  ARTHROPLASTY;  Surgeon: Augustin Schooling, MD;  Location: [place];  Service: Orthopedics;  Laterality: Left;  . SHOULDER HEMI-ARTHROPLASTY  12/25/2010   Procedure: SHOULDER HEMI-ARTHROPLASTY;  Surgeon: Meredith Pel;  Location: [place];  Service: Orthopedics;  Laterality: Right;   Past Medical History:  Diagnosis Date  . Abnormal nuclear stress test    2005, normal cath Dr. Acie Fredrickson, normal stress test 2012 Dr. Irish Lack  . Anemia    chronic  . Anxiety   . Arthritis    djd  . Blood transfusion 1979   for anemia  . Chest pain    evaluated @ [facility] cardiology; had a stress test 2012  . Complication of anesthesia    unable to breathe lying  flat on back  . Diabetes mellitus    type 2  niddm x 25 yrs  . Diverticular disease   . DJD (degenerative joint disease)    lumbar and cervical  . GERD (gastroesophageal reflux disease)   . Hyperlipemia   . Hypertension    on medication  . Hypothyroidism    takes levoxyl  . Irritable bowel syndrome   . 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 Neuromuscular disorder ([place])   . Neuropathy in diabetes ([place])   . Other and unspecified angina pectoris    tests came back NOT heart related  . Pulmonary embolism ([place]) 06-2010   bilateral  . Seizures ([place])    once, age 52   BP (!) 173/75   Pulse (!) 49   Temp 98.8 F (37.1 C)   Ht 4\' 11"  (1.499 m)   Wt 181 lb (82.1 kg)   SpO2 97%   BMI 36.56 kg/m   Opioid Risk Score:   Fall Risk Score:  `1  Depression screen PHQ 2/9  No flowsheet data found.  Review of Systems  Constitutional: Positive for diaphoresis.  HENT: Negative.   Respiratory: Negative.   Cardiovascular: Negative.   Gastrointestinal: Positive for diarrhea.  Endocrine:       High blood sugar  Genitourinary: Positive for difficulty urinating.  Musculoskeletal: Positive for arthralgias, back pain and gait problem.  Skin: Negative.   Allergic/Immunologic: Negative.   Hematological: Negative.   Psychiatric/Behavioral:  The patient is nervous/anxious.       Objective:   Physical Exam Gen: NAD.  MSK:  Gait: Kyphotic  +TTP right lumbosacral PSPs.    No edema.  Neuro: Alert  HOH  Strength  4+/5 B/l LE HF, 4+/5 KE, 5/5 ADF/PF    Assessment & Plan:  Female with pmh/psh of seizures, PE, DM with neuropathy, DJD, solitary kidney, TIA, left shoulder OA, lumbar spinal stensosis, b/l shoulder surgery  presents for follow up for low back pain  1. Chronic mechanical low back pain  MRI 07/2016 reviewed, showing L4-5 stenosis with facet arthropathy.    Pt states she has had ESIs without benefit  Will try to avoid NSAIDs due to solitary kidney  Pt told not to use Voltaren gel by Neurosurg  Cont Heat  Cont TENS IT, with benefit, needs replacement pads, will give contact number for rep again  Cont bracing for periods of excessive activity   Will order Robaxin 500mg  TID PRN  Will consider Gabapentin to 300 TID  Pt could not afford aquatic therapy after 1 visit, encouraged follow up at local pool  Will order Cymbalta 30mg , educated on signs/symptoms of serotonin syndrome   Will consider facet PNS  2. Gait abnormality  Cont cane for safety, previously educated on environmental modifications to limit falls  3. Sleep disturbance  D/ced xanax  Cont trazodone to 50mg  per PCP  4. Diabetic neuropathy  Confounding picture   See #1  5. Myalgias  Will consider trigger point injections in future if necessary

## 2019-04-13 ENCOUNTER — Other Ambulatory Visit: Payer: Self-pay

## 2019-04-13 MED ORDER — METHOCARBAMOL 500 MG PO TABS: 500.0000 mg | ORAL_TABLET | Freq: Three times a day (TID) | ORAL | 1 refills | Status: DC | PRN

## 2019-04-14 DIAGNOSIS — R0602 Shortness of breath: Secondary | ICD-10-CM | POA: Diagnosis not present

## 2019-04-14 DIAGNOSIS — R42 Dizziness and giddiness: Secondary | ICD-10-CM | POA: Diagnosis not present

## 2019-04-14 DIAGNOSIS — R0789 Other chest pain: Secondary | ICD-10-CM | POA: Diagnosis not present

## 2019-04-14 DIAGNOSIS — F419 Anxiety disorder, unspecified: Secondary | ICD-10-CM | POA: Diagnosis not present

## 2019-04-19 DIAGNOSIS — R42 Dizziness and giddiness: Secondary | ICD-10-CM | POA: Diagnosis not present

## 2019-04-19 DIAGNOSIS — E119 Type 2 diabetes mellitus without complications: Secondary | ICD-10-CM | POA: Diagnosis not present

## 2019-04-22 DIAGNOSIS — E119 Type 2 diabetes mellitus without complications: Secondary | ICD-10-CM | POA: Diagnosis not present

## 2019-04-22 DIAGNOSIS — R42 Dizziness and giddiness: Secondary | ICD-10-CM | POA: Diagnosis not present

## 2019-04-23 DIAGNOSIS — E119 Type 2 diabetes mellitus without complications: Secondary | ICD-10-CM | POA: Diagnosis not present

## 2019-04-23 DIAGNOSIS — G459 Transient cerebral ischemic attack, unspecified: Secondary | ICD-10-CM | POA: Diagnosis not present

## 2019-04-23 DIAGNOSIS — I10 Essential (primary) hypertension: Secondary | ICD-10-CM | POA: Diagnosis not present

## 2019-04-23 DIAGNOSIS — E039 Hypothyroidism, unspecified: Secondary | ICD-10-CM | POA: Diagnosis not present

## 2019-04-28 DIAGNOSIS — E119 Type 2 diabetes mellitus without complications: Secondary | ICD-10-CM | POA: Diagnosis not present

## 2019-04-28 DIAGNOSIS — R42 Dizziness and giddiness: Secondary | ICD-10-CM | POA: Diagnosis not present

## 2019-04-29 ENCOUNTER — Other Ambulatory Visit: Payer: Self-pay

## 2019-04-29 ENCOUNTER — Encounter: Payer: Medicare Other | Admitting: Physical Medicine & Rehabilitation

## 2019-04-29 ENCOUNTER — Encounter: Payer: Self-pay | Admitting: Physical Medicine & Rehabilitation

## 2019-04-29 VITALS — BP 184/75 | HR 60 | Temp 97.7°F | Ht 62.0 in | Wt 186.0 lb

## 2019-04-29 DIAGNOSIS — G8929 Other chronic pain: Secondary | ICD-10-CM | POA: Diagnosis not present

## 2019-04-29 DIAGNOSIS — G894 Chronic pain syndrome: Secondary | ICD-10-CM | POA: Insufficient documentation

## 2019-04-29 DIAGNOSIS — M791 Myalgia, unspecified site: Secondary | ICD-10-CM | POA: Diagnosis not present

## 2019-04-29 DIAGNOSIS — R269 Unspecified abnormalities of gait and mobility: Secondary | ICD-10-CM

## 2019-04-29 DIAGNOSIS — M545 Low back pain, unspecified: Secondary | ICD-10-CM

## 2019-04-29 HISTORY — DX: Chronic pain syndrome: G89.4

## 2019-04-29 NOTE — Progress Notes (Addendum)
Subjective:    Patient ID: Hannah Fischer, female    DOB: Jun 08, 1932, 86 y.o.   MRN: [id number]  HPI Female with pmh/psh of seizures, PE, DM with neuropathy, DJD, solitary kidney, TIA, left shoulder OA, lumbar spinal stensosis, b/l shoulder surgery presents for follow up for low back pain.  Initially stated: Started ~2012.   Denies inciting event.  Stable, but exacerbated after fall.  Meds improve the pain.  Standing, ambulation exacerbate the pain.  Achy pain.  Constant.  Radiates at times to left calf.  Has not tried anything.  Associated LLE weakness, numbness, tingling.  Pain prevents from doing ADLs.  2-3 falls in the last year.  She was prescribed Xanax for sleep by PCP.  Last clinic visit on 04/12/19.  Since that time, pt states she obtained pads yesterday for TENS unit, she has not used it yet, however. She had benefit with Robaxin. She is tolerating Cymbalta. Denies falls. She is in vestibular therapy for a "swimmy head" she states started after her CoVid injection in 02/2019.  Pain Inventory Average Pain 0 Pain Right Now 0 My pain is na  In the last 24 hours, has pain interfered with the following? General activity 0 Relation with others 0 Enjoyment of life 0 What TIME of day is your pain at its worst? na Sleep (in general) Fair  Pain is worse with: na Pain improves with: na Relief from Meds: na  Mobility walk with assistance use a cane ability to climb steps?  yes do you drive?  yes transfers alone  Function retired  Neuro/Psych bladder control problems bowel control problems anxiety  Prior Studies Any changes since last visit?  no CT/MRI  Physicians involved in your care Primary care Dr. Sigmund Hazel   Family History  Problem Relation Age of Onset  . Diabetes Mother   . Heart disease Mother   . Heart disease Sister   . Heart disease Brother    Social History   Socioeconomic History  . Marital status: Married    Spouse name: Not on file  . Number  of children: Not on file  . Years of education: Not on file  . Highest education level: Not on file  Occupational History  . Not on file  Tobacco Use  . Smoking status: Never Smoker  . Smokeless tobacco: Never Used  Substance and Sexual Activity  . Alcohol use: No  . Drug use: No  . Sexual activity: Never    Birth control/protection: Post-menopausal  Other Topics Concern  . Not on file  Social History Narrative  . Not on file   Social Determinants of Health   Financial Resource Strain:   . Difficulty of Paying Living Expenses:   Food Insecurity:   . Worried About Programme researcher, broadcasting/film/video in the Last Year:   . Barista in the Last Year:   Transportation Needs:   . Freight forwarder (Medical):   Marland Kitchen Lack of Transportation (Non-Medical):   Physical Activity:   . Days of Exercise per Week:   . Minutes of Exercise per Session:   Stress:   . Feeling of Stress :   Social Connections:   . Frequency of Communication with Friends and Family:   . Frequency of Social Gatherings with Friends and Family:   . Attends Religious Services:   . Active Member of Clubs or Organizations:   . Attends Banker Meetings:   Marland Kitchen Marital Status:    Past  Surgical History:  Procedure Laterality Date  . BACK SURGERY    . BLEPHAROPLASTY  2012   rt eye  . CARDIAC CATHETERIZATION     had one 10/2013  . CHOLECYSTECTOMY    . EYE SURGERY  2007   cataract ext/ iol rt eye  . JOINT REPLACEMENT  2001,2005   both knees  . LEFT HEART CATHETERIZATION WITH CORONARY ANGIOGRAM N/A 10/18/2013   Procedure: LEFT HEART CATHETERIZATION WITH CORONARY ANGIOGRAM;  Surgeon: Corky Crafts, MD;  Location[facility] LAB;  Service: Cardiovascular;  Laterality: N/A;  . LUMBAR DISC SURGERY  1990  . REVERSE SHOULDER ARTHROPLASTY Left 12/03/2013   Procedure: LEFT SHOULDER REVERSE ARTHROPLASTY;  Surgeon: Verlee Rossetti, MD;  Location: [facility] OR;  Service: Orthopedics;  Laterality: Left;  . SHOULDER  HEMI-ARTHROPLASTY  12/25/2010   Procedure: SHOULDER HEMI-ARTHROPLASTY;  Surgeon: Cammy Copa;  Location: [facility] OR;  Service: Orthopedics;  Laterality: Right;   Past Medical History:  Diagnosis Date  . Abnormal nuclear stress test    2005, normal cath Dr. Elease Hashimoto, normal stress test 2012 Dr. Eldridge Dace  . Anemia    chronic  . Anxiety   . Arthritis    djd  . Blood transfusion 1979   for anemia  . Chest pain    evaluated @ [facility] cardiology; had a stress test 2012  . Complication of anesthesia    unable to breathe lying  flat on back  . Diabetes mellitus    type 2  niddm x 25 yrs  . Diverticular disease   . DJD (degenerative joint disease)    lumbar and cervical  . GERD (gastroesophageal reflux disease)   . Hyperlipemia   . Hypertension    on medication  . Hypothyroidism    takes levoxyl  . Irritable bowel syndrome   . 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 Neuromuscular disorder (HCC)   . Neuropathy in diabetes (HCC)   . Other and unspecified angina pectoris    tests came back NOT heart related  . Pulmonary embolism (HCC) 06-2010   bilateral  . Seizures (HCC)    once, age 57   BP (!) 184/75   Pulse 60   Temp 97.7 F (36.5 C)   Ht 5\' 2"  (1.575 m)   Wt 186 lb (84.4 kg)   SpO2 95%   BMI 34.02 kg/m   Opioid Risk Score:   Fall Risk Score:  `1  Depression screen PHQ 2/9  No flowsheet data found.  Review of Systems  Constitutional: Positive for diaphoresis.  HENT: Negative.   Eyes: Negative.   Respiratory: Negative.   Cardiovascular: Negative.   Gastrointestinal: Negative.   Endocrine:       High blood sugar  Genitourinary: Negative.   Musculoskeletal: Positive for arthralgias, back pain and gait problem.  Skin: Negative.   Allergic/Immunologic: Negative.   Neurological: Positive for dizziness.  Hematological: Negative.   Psychiatric/Behavioral: The patient is nervous/anxious.       Objective:   Physical Exam Gen: NAD.  MSK:  Gait:  Kyphotic  +TTP right lumbosacral PSPs and medial gluteal muscles (baseline)    No edema.  Neuro: Alert  [facility]  HOH  Strength  4+/5 B/l LE HF, 4+/5 KE, 5/5 ADF/PF    Assessment & Plan:  Female with pmh/psh of seizures, PE, DM with neuropathy, DJD, solitary kidney, TIA, left shoulder OA, lumbar spinal stensosis, b/l shoulder surgery presents for follow up for low back pain  1.  Chronic mechanical low back pain - some low back pain is baseline due to her undeveloped right kidney per patient  MRI 07/2016 reviewed, showing L4-5 stenosis with facet arthropathy.    Pt states she has had ESIs without benefit  Will try to avoid NSAIDs due to solitary kidney  Pt told not to use Voltaren gel by Neurosurg  Continue Heat  Begin TENS IT now that patient has pads  Cont bracing for periods of excessive activity   Continue Robaxin 500mg  TID PRN, taking 1/day  Will consider Gabapentin to 300 TID  Pt could not afford aquatic therapy after 1 visit, encouraged follow up at local pool  Continue Cymbalta 30mg , educated on signs/symptoms of serotonin syndrome   Will consider facet PNS if necessary   2. Gait abnormality  Continue cane for safety, previously educated on environmental modifications to limit falls  3. Sleep disturbance  D/ced xanax  Cont trazodone to 50mg  per PCP  4. Diabetic neuropathy  Confounding picture   See #1  5. Myalgias  Will consider trigger point injections in future if necessary, relatively controlled at present

## 2019-05-05 DIAGNOSIS — E119 Type 2 diabetes mellitus without complications: Secondary | ICD-10-CM | POA: Diagnosis not present

## 2019-05-05 DIAGNOSIS — R42 Dizziness and giddiness: Secondary | ICD-10-CM | POA: Diagnosis not present

## 2019-05-06 DIAGNOSIS — N3946 Mixed incontinence: Secondary | ICD-10-CM | POA: Diagnosis not present

## 2019-05-06 DIAGNOSIS — R350 Frequency of micturition: Secondary | ICD-10-CM | POA: Diagnosis not present

## 2019-05-11 ENCOUNTER — Telehealth: Payer: Self-pay | Admitting: Cardiology

## 2019-05-11 NOTE — Telephone Encounter (Signed)
New message   Patient wants her husband will come to office visit on Friday. The patient is visually impaired.

## 2019-05-12 DIAGNOSIS — E119 Type 2 diabetes mellitus without complications: Secondary | ICD-10-CM | POA: Diagnosis not present

## 2019-05-12 DIAGNOSIS — R42 Dizziness and giddiness: Secondary | ICD-10-CM | POA: Diagnosis not present

## 2019-05-13 NOTE — Telephone Encounter (Signed)
OK for husband to accompany pt to appt.  She is aware.

## 2019-05-14 ENCOUNTER — Encounter: Payer: Self-pay | Admitting: *Deleted

## 2019-05-14 ENCOUNTER — Ambulatory Visit: Payer: Medicare Other | Admitting: Cardiology

## 2019-05-14 ENCOUNTER — Other Ambulatory Visit: Payer: Self-pay

## 2019-05-14 ENCOUNTER — Encounter: Payer: Self-pay | Admitting: Cardiology

## 2019-05-14 ENCOUNTER — Encounter (INDEPENDENT_AMBULATORY_CARE_PROVIDER_SITE_OTHER): Payer: Self-pay

## 2019-05-14 VITALS — BP 110/60 | HR 59 | Ht 62.0 in | Wt 181.0 lb

## 2019-05-14 DIAGNOSIS — R0602 Shortness of breath: Secondary | ICD-10-CM

## 2019-05-14 DIAGNOSIS — I2510 Atherosclerotic heart disease of native coronary artery without angina pectoris: Secondary | ICD-10-CM

## 2019-05-14 DIAGNOSIS — R079 Chest pain, unspecified: Secondary | ICD-10-CM | POA: Diagnosis not present

## 2019-05-14 DIAGNOSIS — E119 Type 2 diabetes mellitus without complications: Secondary | ICD-10-CM

## 2019-05-14 DIAGNOSIS — I700 Atherosclerosis of aorta: Secondary | ICD-10-CM | POA: Diagnosis not present

## 2019-05-14 DIAGNOSIS — I10 Essential (primary) hypertension: Secondary | ICD-10-CM

## 2019-05-14 DIAGNOSIS — I2584 Coronary atherosclerosis due to calcified coronary lesion: Secondary | ICD-10-CM

## 2019-05-14 NOTE — Patient Instructions (Signed)
Medication Instructions:  The current medical regimen is effective;  continue present plan and medications.  *If you need a refill on your cardiac medications before your next appointment, please call your pharmacy*  Testing/Procedures: Your physician has requested that you have an echocardiogram. Echocardiography is a painless test that uses sound waves to create images of your heart. It provides your doctor with information about the size and shape of your heart and how well your heart's chambers and valves are working. This procedure takes approximately one hour. There are no restrictions for this procedure.  Your physician has requested that you have a lexiscan myoview. For further information please visit [email or web address] Please follow instruction sheet, as given.  Follow-Up: Further follow up will be determined after the above testing has been completed.  We recommend signing up for the patient portal called "MyChart".  Sign up information is provided on this After Visit Summary.  MyChart is used to connect with patients for Virtual Visits (Telemedicine).  Patients are able to view lab/test results, encounter notes, upcoming appointments, etc.  Non-urgent messages can be sent to your provider as well.   To learn more about what you can do with MyChart, go to [email or web address].     Thank you for choosing [facility]!!

## 2019-05-14 NOTE — Progress Notes (Signed)
Cardiology Office Note:    Date:  05/14/2019   ID:  Hannah Fischer, DOB 1932-12-09, MRN [id number]  PCP:  Sigmund Hazel, MD  Cardiologist:  Donato Schultz, MD  Electrophysiologist:  None   Referring MD: Juliet Rude*     History of Present Illness:    Hannah Fischer is a 86 y.o. female here for the evaluation of chest pain, shortness of breath at the request of Dr. Sigmund Hazel.  Has had cardiac work-up in the past.  Prior stress test in 2012 was low risk.  Prior catheterization in 2005 was normal per Dr. Elease Hashimoto. Echo in 2018 showed EF of 60 to 65% with diastolic dysfunction mild mitral regurgitation.  She did experience Covid back in January 2021.  Still mentions that her chest is feeling heavy and it is hard to breathe with exertion.  When taking a deep breath sometimes she may feel some chest discomfort.  Acknowledges that this could be anxiety, knot in throat as well.  She had this experience last on Sunday while in church. She was holding a baby. Started to feel little bit of discomfort and shortness of breath.  She did have a CT scan of her chest on 03/15/2019-lung fields appeared clear. She did have coronary artery calcification as well as mitral annular calcification. Her pulmonary artery did seem mildly dilated. She also had aortic atherosclerosis. What looked like right hydronephrosis was worked up subsequently. She said she is fine.    Past Medical History:  Diagnosis Date  . Abnormal nuclear stress test    2005, normal cath Dr. Elease Hashimoto, normal stress test 2012 Dr. Eldridge Dace  . Anemia    chronic  . Anxiety   . Arthritis    djd  . Arthritis of shoulder region, left, degenerative 12/03/2013  . Blood transfusion 1979   for anemia  . Chest pain    evaluated @ [facility] cardiology; had a stress test 2012  . Chronic pain syndrome 04/29/2019  . Chronic right-sided low back pain without sciatica 04/12/2019  . Complication of anesthesia    unable to breathe lying  flat on  back  . Diabetes mellitus    type 2  niddm x 25 yrs  . Diabetes mellitus (HCC) 11/15/2011  . Diverticular disease   . DJD (degenerative joint disease)    lumbar and cervical  . GERD (gastroesophageal reflux disease)   . Hyperlipemia   . Hypertension    on medication  . Hypothyroidism    takes levoxyl  . Irritable bowel syndrome   . 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 Neuromuscular disorder (HCC)   . Neuropathy in diabetes (HCC)   . Nonspecific abnormal electrocardiogram (ECG) (EKG) 10/15/2013  . Other and unspecified angina pectoris    tests came back NOT heart related  . Pulmonary embolism (HCC) 06-2010   bilateral  . Pyuria 11/15/2011  . Seizures (HCC)    once, age 61  . TIA (transient ischemic attack) 11/15/2011    Past Surgical History:  Procedure Laterality Date  . BACK SURGERY    . BLEPHAROPLASTY  2012   rt eye  . CARDIAC CATHETERIZATION     had one 10/2013  . CHOLECYSTECTOMY    . EYE SURGERY  2007   cataract ext/ iol rt eye  . JOINT REPLACEMENT  2001,2005   both knees  . LEFT HEART CATHETERIZATION WITH CORONARY ANGIOGRAM N/A 10/18/2013   Procedure: LEFT HEART CATHETERIZATION WITH CORONARY ANGIOGRAM;  Surgeon: Corky Crafts, MD;  Location: [facility] CATH LAB;  Service: Cardiovascular;  Laterality: N/A;  . LUMBAR DISC SURGERY  1990  . REVERSE SHOULDER ARTHROPLASTY Left 12/03/2013   Procedure: LEFT SHOULDER REVERSE ARTHROPLASTY;  Surgeon: Verlee Rossetti, MD;  Location: [facility] OR;  Service: Orthopedics;  Laterality: Left;  . SHOULDER HEMI-ARTHROPLASTY  12/25/2010   Procedure: SHOULDER HEMI-ARTHROPLASTY;  Surgeon: Cammy Copa;  Location: [facility] OR;  Service: Orthopedics;  Laterality: Right;    Current Medications: Current Meds  Medication Sig  . ALPRAZolam (XANAX) 0.25 MG tablet Take 0.25 mg by mouth at bedtime as needed for sleep.  . cephALEXin (KEFLEX) 500 MG capsule Take 1 capsule (500 mg total) by mouth 2 (two) times daily.  . Cholecalciferol  (VITAMIN D3) 2000 UNITS TABS Take 2,000 Units by mouth daily.   . ciprofloxacin (CIPRO) 250 MG tablet Take 250 mg by mouth 2 (two) times daily.  . clopidogrel (PLAVIX) 75 MG tablet Take 1 tablet (75 mg total) by mouth daily with breakfast.  . Cyanocobalamin (VITAMIN B-12 PO) Take 1 tablet by mouth daily.   . DULoxetine (CYMBALTA) 30 MG capsule Take 1 capsule (30 mg total) by mouth daily.  Marland Kitchen levothyroxine (SYNTHROID, LEVOTHROID) 100 MCG tablet Take 1 tablet (100 mcg total) by mouth daily.  Marland Kitchen losartan (COZAAR) 100 MG tablet Take 100 mg by mouth daily.  . metFORMIN (GLUCOPHAGE) 1000 MG tablet Take 1,000 mg by mouth 2 (two) times daily with a meal.  . methocarbamol (ROBAXIN) 500 MG tablet Take 1 tablet (500 mg total) by mouth every 8 (eight) hours as needed for muscle spasms.  . Omega-3 Fatty Acids (FISH OIL) 1000 MG CAPS Take 1 capsule by mouth 2 (two) times daily.   . pioglitazone (ACTOS) 45 MG tablet Take 45 mg by mouth daily.    . propranolol (INDERAL) 40 MG tablet Take 40 mg by mouth daily.   . traZODone (DESYREL) 50 MG tablet TAKE 1 TABLET BY MOUTH AT BEDTIME     Allergies:   Codeine, Sulfites, and Zocor [simvastatin]   Social History   Socioeconomic History  . Marital status: Married    Spouse name: Not on file  . Number of children: Not on file  . Years of education: Not on file  . Highest education level: Not on file  Occupational History  . Not on file  Tobacco Use  . Smoking status: Never Smoker  . Smokeless tobacco: Never Used  Substance and Sexual Activity  . Alcohol use: No  . Drug use: No  . Sexual activity: Never    Birth control/protection: Post-menopausal  Other Topics Concern  . Not on file  Social History Narrative  . Not on file   Social Determinants of Health   Financial Resource Strain:   . Difficulty of Paying Living Expenses:   Food Insecurity:   . Worried About Programme researcher, broadcasting/film/video in the Last Year:   . Barista in the Last Year:     Transportation Needs:   . Freight forwarder (Medical):   Marland Kitchen Lack of Transportation (Non-Medical):   Physical Activity:   . Days of Exercise per Week:   . Minutes of Exercise per Session:   Stress:   . Feeling of Stress :   Social Connections:   . Frequency of Communication with Friends and Family:   . Frequency of Social Gatherings with Friends and Family:   . Attends Religious Services:   . Active Member of Clubs  or Organizations:   . Attends Archivist Meetings:   Marland Kitchen Marital Status:      Family History: The patient's family history includes Diabetes in her mother; Heart disease in her brother, mother, and sister.  ROS:   Please see the history of present illness.    No fevers chills nausea vomiting syncope bleeding all other systems reviewed and are negative.  EKGs/Labs/Other Studies Reviewed:    The following studies were reviewed today: CT scan as above, Dr. Ammie Ferrier note  EKG:  EKG is  ordered today.  The ekg ordered today demonstrates sinus rhythm 59 incomplete left bundle branch block left anterior fascicular block  Recent Labs: 03/15/2019: ALT 12; BUN 16; Creatinine, Ser 0.94; Hemoglobin 12.0; Platelets 201; Potassium 3.9; Sodium 140  Recent Lipid Panel    Component Value Date/Time   CHOL 207 (H) 11/15/2011 1622   TRIG 157 (H) 11/15/2011 1622   HDL 66 11/15/2011 1622   CHOLHDL 3.1 11/15/2011 1622   VLDL 31 11/15/2011 1622   LDLCALC 110 (H) 11/15/2011 1622    Physical Exam:    VS:  BP 110/60   Pulse (!) 59   Ht 5\' 2"  (1.575 m)   Wt 181 lb (82.1 kg)   SpO2 97%   BMI 33.11 kg/m     Wt Readings from Last 3 Encounters:  05/14/19 181 lb (82.1 kg)  04/29/19 186 lb (84.4 kg)  04/12/19 181 lb (82.1 kg)     GEN:  Well nourished, well developed in no acute distress HEENT: Normal NECK: No JVD; No carotid bruits LYMPHATICS: No lymphadenopathy CARDIAC: RRR, no murmurs, rubs, gallops RESPIRATORY:  Clear to auscultation without rales, wheezing or  rhonchi  ABDOMEN: Soft, non-tender, non-distended MUSCULOSKELETAL:  No edema; No deformity  SKIN: Warm and dry NEUROLOGIC:  Alert and oriented x 3 PSYCHIATRIC:  Normal affect   ASSESSMENT:    1. Shortness of breath   2. Chest pain, unspecified type   3. Coronary artery calcification   4. Aortic atherosclerosis ([place])   5. Diabetes mellitus with coincident hypertension (Normal)    PLAN:    In order of problems listed above:  Atypical chest pain/shortness of breath -We will go ahead and check a echocardiogram. Prior mild mitral regurgitation noted. Normal EF. She does have murmur of mitral regurgitation noted. Pulmonary artery appears enlarged on CT scan, should her pulmonary pressures may be elevated. -We will go ahead also and check a Lexiscan stress test. Is been several years. She does have dense coronary calcification noted on CT. -She currently takes Plavix 75 mg a day. She is also being treated for diabetes.  Hyperlipidemia -LDL cholesterol 133 on 08/12/2018. With her coronary artery calcification and aortic atherosclerosis, I would encourage statin use. She did have some muscle weakness with simvastatin in the past and is hesitant to utilize. Continue to encourage.  Aortic atherosclerosis -Would encourage statin use.  Coronary artery calcification -Would encourage statin use. Checking stress test.  Diabetes with hypertension -Currently well controlled, Dr. Sabra Heck.     Medication Adjustments/Labs and Tests Ordered: Current medicines are reviewed at length with the patient today.  Concerns regarding medicines are outlined above.  Orders Placed This Encounter  Procedures  . MYOCARDIAL PERFUSION IMAGING  . EKG 12-Lead  . ECHOCARDIOGRAM COMPLETE   No orders of the defined types were placed in this encounter.   Patient Instructions  Medication Instructions:  The current medical regimen is effective;  continue present plan and medications.  *If you need  a refill on your  cardiac medications before your next appointment, please call your pharmacy*  Testing/Procedures: Your physician has requested that you have an echocardiogram. Echocardiography is a painless test that uses sound waves to create images of your heart. It provides your doctor with information about the size and shape of your heart and how well your heart's chambers and valves are working. This procedure takes approximately one hour. There are no restrictions for this procedure.  Your physician has requested that you have a lexiscan myoview. For further information please visit [email or web address] Please follow instruction sheet, as given.  Follow-Up: Further follow up will be determined after the above testing has been completed.  We recommend signing up for the patient portal called "MyChart".  Sign up information is provided on this After Visit Summary.  MyChart is used to connect with patients for Virtual Visits (Telemedicine).  Patients are able to view lab/test results, encounter notes, upcoming appointments, etc.  Non-urgent messages can be sent to your provider as well.   To learn more about what you can do with MyChart, go to [email or web address].     Thank you for choosing [facility]!!        Signed, Donato Schultz, MD  05/14/2019 11:43 AM    [facility]

## 2019-05-19 DIAGNOSIS — R42 Dizziness and giddiness: Secondary | ICD-10-CM | POA: Diagnosis not present

## 2019-05-19 DIAGNOSIS — E119 Type 2 diabetes mellitus without complications: Secondary | ICD-10-CM | POA: Diagnosis not present

## 2019-05-26 ENCOUNTER — Telehealth (HOSPITAL_COMMUNITY): Payer: Self-pay

## 2019-05-26 ENCOUNTER — Telehealth (HOSPITAL_COMMUNITY): Payer: Self-pay | Admitting: *Deleted

## 2019-05-26 NOTE — Telephone Encounter (Signed)
Patient given detailed instructions per Myocardial Perfusion Study Information Sheet for the test on 06/01/19 at 10:15. Patient notified to arrive 15 minutes early and that it is imperative to arrive on time for appointment to keep from having the test rescheduled.  If you need to cancel or reschedule your appointment, please call the office within 24 hours of your appointment. . Patient verbalized understanding.Hannah Fischer

## 2019-05-26 NOTE — Telephone Encounter (Signed)
Detailed instructions left on the patient's answering machine per the DPR. Asked to call back with any questions. S.Gale Klar EMTP

## 2019-05-27 DIAGNOSIS — J029 Acute pharyngitis, unspecified: Secondary | ICD-10-CM | POA: Diagnosis not present

## 2019-05-27 DIAGNOSIS — E039 Hypothyroidism, unspecified: Secondary | ICD-10-CM | POA: Diagnosis not present

## 2019-05-27 DIAGNOSIS — Z0000 Encounter for general adult medical examination without abnormal findings: Secondary | ICD-10-CM | POA: Diagnosis not present

## 2019-05-27 DIAGNOSIS — J3489 Other specified disorders of nose and nasal sinuses: Secondary | ICD-10-CM | POA: Diagnosis not present

## 2019-06-01 ENCOUNTER — Other Ambulatory Visit: Payer: Self-pay

## 2019-06-01 ENCOUNTER — Ambulatory Visit (HOSPITAL_BASED_OUTPATIENT_CLINIC_OR_DEPARTMENT_OTHER): Payer: Medicare Other

## 2019-06-01 ENCOUNTER — Ambulatory Visit (HOSPITAL_COMMUNITY): Payer: Medicare Other | Attending: Cardiovascular Disease

## 2019-06-01 DIAGNOSIS — R079 Chest pain, unspecified: Secondary | ICD-10-CM | POA: Insufficient documentation

## 2019-06-01 DIAGNOSIS — R0602 Shortness of breath: Secondary | ICD-10-CM | POA: Diagnosis not present

## 2019-06-01 LAB — MYOCARDIAL PERFUSION IMAGING
LV dias vol: 75 mL (ref 46–106)
LV sys vol: 24 mL
Peak HR: 70 {beats}/min
Rest HR: 57 {beats}/min
SDS: 4
SRS: 2
SSS: 6
TID: 1.02

## 2019-06-01 LAB — ECHOCARDIOGRAM COMPLETE: Weight: 2896 oz

## 2019-06-01 MED ORDER — REGADENOSON 0.4 MG/5ML IV SOLN
0.4000 mg | Freq: Once | INTRAVENOUS | Status: AC
  Administered 2019-06-01: 0.4 mg via INTRAVENOUS

## 2019-06-01 MED ORDER — TECHNETIUM TC 99M TETROFOSMIN IV KIT
10.5000 | PACK | Freq: Once | INTRAVENOUS | Status: AC | PRN
  Administered 2019-06-01: 10.5 via INTRAVENOUS
  Filled 2019-06-01: qty 11

## 2019-06-01 MED ORDER — TECHNETIUM TC 99M TETROFOSMIN IV KIT
32.1000 | PACK | Freq: Once | INTRAVENOUS | Status: AC | PRN
  Administered 2019-06-01: 32.1 via INTRAVENOUS
  Filled 2019-06-01: qty 33

## 2019-06-07 ENCOUNTER — Telehealth: Payer: Self-pay | Admitting: Cardiology

## 2019-06-07 NOTE — Telephone Encounter (Signed)
New Message  Patient is returning call. Please give patient a call back with her echo results.

## 2019-06-07 NOTE — Telephone Encounter (Signed)
Spoke with patient and review results of both her echo and lexiscan.  All questions, if any were answered at the time of the call.

## 2019-06-10 ENCOUNTER — Other Ambulatory Visit: Payer: Self-pay | Admitting: Physical Medicine & Rehabilitation

## 2019-06-15 ENCOUNTER — Ambulatory Visit: Payer: Medicare Other | Admitting: Neurology

## 2019-06-15 ENCOUNTER — Other Ambulatory Visit: Payer: Self-pay

## 2019-06-15 ENCOUNTER — Encounter: Payer: Self-pay | Admitting: Neurology

## 2019-06-15 VITALS — BP 204/85 | HR 49 | Ht 63.0 in | Wt 178.0 lb

## 2019-06-15 DIAGNOSIS — R42 Dizziness and giddiness: Secondary | ICD-10-CM | POA: Diagnosis not present

## 2019-06-15 DIAGNOSIS — I671 Cerebral aneurysm, nonruptured: Secondary | ICD-10-CM | POA: Diagnosis not present

## 2019-06-15 NOTE — Progress Notes (Signed)
PATIENT: Hannah Fischer DOB: 29-Nov-1932  Chief Complaint  Patient presents with  . New Patient (Initial Visit)    PCP: Dr. Sigmund Hazel. Referring: Ernst Bowler, PA. Vision: 20/100 without correction bilaterally.  . Dizziness    Pt complaining of dizziness for the past 6 months. Orthos- Lying: 207/73, 58 Sitting: 204/85, 49 Standing: 158/89, 55  . Headache    Pt complaining on a daily headache with variable intensity.     HISTORICAL  Hannah Fischer is a 86 year old female, seen in request by her primary care doctor Sigmund Hazel for evaluation of dizziness, initial evaluation was on Jun 15, 2019,  I have reviewed and summarized the referring note from the referring physician.  She has past medical history of hypothyroidism, on supplement, diabetes, hypertension, she had a history of bilateral shoulder and knee replacement, ambulate with mild difficulty, using cane for longer distance, she enjoys bowling with her husband on a weekly basis  Since October 2020, she began to notice intermittent dizziness, she is a poor historian, she describes dizziness woozy headedness when getting up from seated down position, which has been present frequently, almost daily basis, in addition, she complains of transient spinning sensation when lying down from sitting position.  Today she has significant orthostatic blood pressure changes, blood pressure lying down 207/73, heart rate of 58, sitting up 204/84, heart rate of 49, standing up 158/89, heart rate of 55,  She has intermittent bilateral feet paresthesia, complains of low back pain,  I personally reviewed MRI of lumbar in June 2018: Degenerative changes of lumbar spine, with severe central canal stenosis at L4-5, marked with narrowing in the subarticular recess, worse on the right, marked convex right scoliosis  MRI of the brain in 2013, generalized atrophy, supratentorium small vessel disease  MRI of the brain, 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  Laboratory evaluation in 2021, CBC showed hemoglobin of 12, CMP showed glucose of 120, creatinine of 0.94,   REVIEW OF SYSTEMS: Full 14 system review of systems performed and notable only for as above All other review of systems were negative.  ALLERGIES: Allergies  Allergen Reactions  . Codeine Anxiety  . Sulfites Other (See Comments)    Abdominal pain  . Zocor [Simvastatin] Other (See Comments)    Muscle weakness    HOME MEDICATIONS: Current Outpatient Medications  Medication Sig Dispense Refill  . ALPRAZolam (XANAX) 0.25 MG tablet Take 0.25 mg by mouth at bedtime as needed for sleep.  0  . cephALEXin (KEFLEX) 500 MG capsule Take 1 capsule (500 mg total) by mouth 2 (two) times daily. 10 capsule 0  . Cholecalciferol (VITAMIN D3) 2000 UNITS TABS Take 2,000 Units by mouth daily.     . ciprofloxacin (CIPRO) 250 MG tablet Take 250 mg by mouth 2 (two) times daily.  0  . clopidogrel (PLAVIX) 75 MG tablet Take 1 tablet (75 mg total) by mouth daily with breakfast. 30 tablet 1  . Cyanocobalamin (VITAMIN B-12 PO) Take 1 tablet by mouth daily.     . DULoxetine (CYMBALTA) 30 MG capsule TAKE 1 CAPSULE(30 MG) BY MOUTH DAILY 30 capsule 2  . levothyroxine (SYNTHROID, LEVOTHROID) 100 MCG tablet Take 1 tablet (100 mcg total) by mouth daily. 30 tablet 1  . losartan (COZAAR) 100 MG tablet Take 100 mg by mouth daily.    . metFORMIN (GLUCOPHAGE) 1000  MG tablet Take 1,000 mg by mouth 2 (two) times daily with a meal.    . methocarbamol (ROBAXIN) 500 MG tablet Take 1 tablet (500 mg total) by mouth every 8 (eight) hours as needed for muscle spasms. 90 tablet 1  . Omega-3 Fatty Acids (FISH OIL) 1000 MG CAPS Take 1 capsule by mouth 2 (two) times daily.     . pioglitazone (ACTOS) 45 MG tablet Take 45 mg by mouth daily.      . propranolol (INDERAL) 40  MG tablet Take 40 mg by mouth daily.   1  . traZODone (DESYREL) 50 MG tablet TAKE 1 TABLET BY MOUTH AT BEDTIME 90 tablet 0   No current facility-administered medications for this visit.    PAST MEDICAL HISTORY: Past Medical History:  Diagnosis Date  . Abnormal nuclear stress test    2005, normal cath Dr. Acie Fredrickson, normal stress test 2012 Dr. Irish Lack  . Anemia    chronic  . Anxiety   . Arthritis    djd  . Arthritis of shoulder region, left, degenerative 12/03/2013  . Blood transfusion 1979   for anemia  . Chest pain    evaluated @ [facility] cardiology; had a stress test 2012  . Chronic pain syndrome 04/29/2019  . Chronic right-sided low back pain without sciatica 04/12/2019  . Complication of anesthesia    unable to breathe lying  flat on back  . Diabetes mellitus    type 2  niddm x 25 yrs  . Diabetes mellitus ([place]) 11/15/2011  . Diverticular disease   . DJD (degenerative joint disease)    lumbar and cervical  . GERD (gastroesophageal reflux disease)   . Hyperlipemia   . Hypertension    on medication  . Hypothyroidism    takes levoxyl  . Irritable bowel syndrome   . 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 Neuromuscular disorder ([place])   . Neuropathy in diabetes ([place])   . Nonspecific abnormal electrocardiogram (ECG) (EKG) 10/15/2013  . Other and unspecified angina pectoris    tests came back NOT heart related  . Pulmonary embolism ([place]) 06-2010   bilateral  . Pyuria 11/15/2011  . Seizures ([place])    once, age 89  . TIA (transient ischemic attack) 11/15/2011    PAST SURGICAL HISTORY: Past Surgical History:  Procedure Laterality Date  . BACK SURGERY    . BLEPHAROPLASTY  2012   rt eye  . CARDIAC CATHETERIZATION     had one 10/2013  . CHOLECYSTECTOMY    . EYE SURGERY  2007   cataract ext/ iol rt eye  . JOINT REPLACEMENT  2001,2005   both knees  . LEFT HEART CATHETERIZATION WITH CORONARY ANGIOGRAM N/A 10/18/2013   Procedure: LEFT HEART CATHETERIZATION WITH  CORONARY ANGIOGRAM;  Surgeon: Jettie Booze, MD;  Location: [facility] CATH LAB;  Service: Cardiovascular;  Laterality: N/A;  . LUMBAR [place]  . REVERSE SHOULDER ARTHROPLASTY Left 12/03/2013   Procedure: LEFT SHOULDER REVERSE ARTHROPLASTY;  Surgeon: Augustin Schooling, MD;  Location: [place];  Service: Orthopedics;  Laterality: Left;  . SHOULDER HEMI-ARTHROPLASTY  12/25/2010   Procedure: SHOULDER HEMI-ARTHROPLASTY;  Surgeon: Meredith Pel;  Location: [place];  Service: Orthopedics;  Laterality: Right;    FAMILY HISTORY: Family History  Problem Relation Age of Onset  . Diabetes Mother   . Heart disease Mother   . Heart disease Sister   . Heart disease Brother     SOCIAL HISTORY: Social History  Socioeconomic History  . Marital status: Married    Spouse name: Not on file  . Number of children: Not on file  . Years of education: Not on file  . Highest education level: Not on file  Occupational History  . Not on file  Tobacco Use  . Smoking status: Never Smoker  . Smokeless tobacco: Never Used  Substance and Sexual Activity  . Alcohol use: No  . Drug use: No  . Sexual activity: Never    Birth control/protection: Post-menopausal  Other Topics Concern  . Not on file  Social History Narrative  . Not on file   Social Determinants of Health   Financial Resource Strain:   . Difficulty of Paying Living Expenses:   Food Insecurity:   . Worried About Programme researcher, broadcasting/film/video in the Last Year:   . Barista in the Last Year:   Transportation Needs:   . Freight forwarder (Medical):   Marland Kitchen Lack of Transportation (Non-Medical):   Physical Activity:   . Days of Exercise per Week:   . Minutes of Exercise per Session:   Stress:   . Feeling of Stress :   Social Connections:   . Frequency of Communication with Friends and Family:   . Frequency of Social Gatherings with Friends and Family:   . Attends Religious Services:   . Active Member of Clubs or Organizations:     . Attends Banker Meetings:   Marland Kitchen Marital Status:   Intimate Partner Violence:   . Fear of Current or Ex-Partner:   . Emotionally Abused:   Marland Kitchen Physically Abused:   . Sexually Abused:      PHYSICAL EXAM   Vitals:   06/15/19 1531  BP: (!) 204/85  Pulse: (!) 49  Weight: 178 lb (80.7 kg)  Height: 5\' 3"  (1.6 m)    Not recorded     Blood pressure lying down 207/73, heart rate of 58, sitting up 204/85, heart rate of 49, standing up 158/89 heart rate of 55 Body mass index is 31.53 kg/m.  PHYSICAL EXAMNIATION:  Gen: NAD, conversant, well nourised, well groomed                     Cardiovascular: Regular rate rhythm, no peripheral edema, warm, nontender. Eyes: Conjunctivae clear without exudates or hemorrhage Neck: Supple, no carotid bruits. Pulmonary: Clear to auscultation bilaterally   NEUROLOGICAL EXAM:  MENTAL STATUS: Speech:    Speech is normal; fluent and spontaneous with normal comprehension.  Cognition:     Orientation to time, place and person     Normal recent and remote memory     Normal Attention span and concentration     Normal Language, naming, repeating,spontaneous speech     Fund of knowledge   CRANIAL NERVES: CN II: Visual fields are full to confrontation. Pupils are round equal and briskly reactive to light. CN III, IV, VI: extraocular movement are normal. No ptosis. CN V: Facial sensation is intact to light touch CN VII: Face is symmetric with normal eye closure  CN VIII: Hearing is normal to causal conversation. CN IX, X: Phonation is normal. CN XI: Head turning and shoulder shrug are intact  MOTOR: Limited range of motion of bilateral shoulder,. Muscle bulk and tone are normal. Muscle strength is normal.  REFLEXES: Reflexes are hypoactive and symmetric at the biceps, triceps, knees, and ankles. Plantar responses are flexor.  SENSORY: Intact to light touch, pinprick and vibratory sensation are  intact in fingers and  toes.  COORDINATION: There is no trunk or limb dysmetria noted.  GAIT/STANCE: She needs push-up to get up from seated position, mildly antalgic Romberg is absent.   DIAGNOSTIC DATA (LABS, IMAGING, TESTING) - I reviewed patient records, labs, notes, testing and imaging myself where available.   ASSESSMENT AND PLAN  RUTHELLA KIRCHMAN is a 86 y.o. female   Dizziness  She described positional related woozy headedness, as correlate with her documented orthostatic blood pressure changes, with systolic blood pressure dropped of 50 millimeter mercury when standing up with no significant heart rate increase,  In addition, she also described spinning sensation when lying down, no differentiation diagnosis of the vertigo described about include posterior circulation insufficiency,  She does has multiple vascular risk factors, aging, hypertension, diabetes, hyperlipidemia,  Complete evaluation with MRI of brain, MRA of the brain, neck,  Laboratory evaluations  Keep Plavix  Also encouraged her increase water intake   Levert Feinstein, M.D. Ph.D.  [facility] [address], [address] [place], [place] [address] Ph: [phone] Fax: [phone]  CC: Sigmund Hazel, MD

## 2019-06-16 ENCOUNTER — Telehealth: Payer: Self-pay | Admitting: Neurology

## 2019-06-16 LAB — TSH: TSH: 2.76 u[IU]/mL (ref 0.450–4.500)

## 2019-06-16 LAB — C-REACTIVE PROTEIN: CRP: <1 mg/L (ref 0–10)

## 2019-06-16 LAB — VITAMIN B12: Vitamin B-12: 362 pg/mL (ref 232–1245)

## 2019-06-16 LAB — CK: Total CK: 70 U/L (ref 26–161)

## 2019-06-16 LAB — HGB A1C W/O EAG: Hgb A1c MFr Bld: 6.9 % — ABNORMAL HIGH (ref 4.8–5.6)

## 2019-06-16 LAB — VITAMIN D 25 HYDROXY (VIT D DEFICIENCY, FRACTURES): Vit D, 25-Hydroxy: 30.2 ng/mL (ref 30.0–100.0)

## 2019-06-16 LAB — RPR: RPR Ser Ql: NONREACTIVE

## 2019-06-16 LAB — ANA W/REFLEX IF POSITIVE: Anti Nuclear Antibody (ANA): NEGATIVE

## 2019-06-16 LAB — SEDIMENTATION RATE: Sed Rate: 12 mm/hr (ref 0–40)

## 2019-06-16 NOTE — Telephone Encounter (Signed)
I contacted the pt and advised of results. She verbalized understanding and had no questions/concerns at this time.

## 2019-06-16 NOTE — Telephone Encounter (Signed)
[employer] medicare order sent to GI. No auth they will reach out to the patient to schedule.  

## 2019-06-16 NOTE — Telephone Encounter (Signed)
Please call patient, laboratory evaluation showed mild elevated A1c 6.9, consistent with her diagnosis of diabetes, rest of the laboratory evaluation showed no significant abnormalities.

## 2019-06-30 DIAGNOSIS — E119 Type 2 diabetes mellitus without complications: Secondary | ICD-10-CM | POA: Diagnosis not present

## 2019-06-30 DIAGNOSIS — E039 Hypothyroidism, unspecified: Secondary | ICD-10-CM | POA: Diagnosis not present

## 2019-06-30 DIAGNOSIS — I10 Essential (primary) hypertension: Secondary | ICD-10-CM | POA: Diagnosis not present

## 2019-06-30 DIAGNOSIS — G459 Transient cerebral ischemic attack, unspecified: Secondary | ICD-10-CM | POA: Diagnosis not present

## 2019-07-12 DIAGNOSIS — M47896 Other spondylosis, lumbar region: Secondary | ICD-10-CM | POA: Diagnosis not present

## 2019-07-12 DIAGNOSIS — G459 Transient cerebral ischemic attack, unspecified: Secondary | ICD-10-CM | POA: Diagnosis not present

## 2019-07-12 DIAGNOSIS — E039 Hypothyroidism, unspecified: Secondary | ICD-10-CM | POA: Diagnosis not present

## 2019-07-12 DIAGNOSIS — I10 Essential (primary) hypertension: Secondary | ICD-10-CM | POA: Diagnosis not present

## 2019-07-20 ENCOUNTER — Ambulatory Visit
Admission: RE | Admit: 2019-07-20 | Discharge: 2019-07-20 | Disposition: A | Discharge status: Home / Self Care | Payer: Medicare Other | Source: Ambulatory Visit | Attending: Neurology | Admitting: Neurology

## 2019-07-20 ENCOUNTER — Other Ambulatory Visit: Payer: Self-pay

## 2019-07-20 DIAGNOSIS — R42 Dizziness and giddiness: Secondary | ICD-10-CM

## 2019-07-20 MED ORDER — GADOBENATE DIMEGLUMINE 529 MG/ML IV SOLN
16.0000 mL | Freq: Once | INTRAVENOUS | Status: AC | PRN
  Administered 2019-07-20: 16 mL via INTRAVENOUS

## 2019-07-22 ENCOUNTER — Telehealth: Payer: Self-pay | Admitting: Neurology

## 2019-07-22 DIAGNOSIS — R42 Dizziness and giddiness: Secondary | ICD-10-CM

## 2019-07-22 DIAGNOSIS — I639 Cerebral infarction, unspecified: Secondary | ICD-10-CM | POA: Insufficient documentation

## 2019-07-22 NOTE — Telephone Encounter (Signed)
Please call patient, MRI of the brain showed old left parieto-occipital stroke, also evidence of chronic numerous cerebral microhemorrhage  MRA of the brain and neck showed no significant large vessel disease  She should continue her Plavix daily  I ordered EEG,  Encouraged her to document all the dizziness spells,  Abnormal MRI brain without contrast demonstrating: -Chronic left parietal occipital ischemic infarction.  This is a new finding compared to CT from 2013. -Numerous chronic cerebral microhemorrhages.  Also foci of sulcal hemosiderin deposition in the right frontal and left parietal regions.  These findings can be seen in the setting of amyloid angiopathy. -No acute findings.  IMPRESSION:   MRA neck (with and without) demonstrating: -Bilateral internal carotid arteries have no stenosis. -Left vertebral artery is dominant has no stenosis. -Right vertebral artery is hypoplastic, has several foci of atherosclerotic irregularity and terminates into posterior inferior cerebral artery.

## 2019-07-22 NOTE — Telephone Encounter (Signed)
I spoke to the patient and she verbalized understanding of the results below. She will continue her Plavix and document her dizzy spells. She has been scheduled for an EEG on 08/18/19.

## 2019-07-28 DIAGNOSIS — E7800 Pure hypercholesterolemia, unspecified: Secondary | ICD-10-CM | POA: Diagnosis not present

## 2019-07-28 DIAGNOSIS — I10 Essential (primary) hypertension: Secondary | ICD-10-CM | POA: Diagnosis not present

## 2019-07-28 DIAGNOSIS — G459 Transient cerebral ischemic attack, unspecified: Secondary | ICD-10-CM | POA: Diagnosis not present

## 2019-07-28 DIAGNOSIS — Z6833 Body mass index (BMI) 33.0-33.9, adult: Secondary | ICD-10-CM | POA: Diagnosis not present

## 2019-08-02 ENCOUNTER — Encounter: Payer: Self-pay | Admitting: Physical Medicine & Rehabilitation

## 2019-08-02 ENCOUNTER — Other Ambulatory Visit: Payer: Self-pay

## 2019-08-02 ENCOUNTER — Encounter: Payer: Medicare Other | Attending: Physical Medicine & Rehabilitation | Admitting: Physical Medicine & Rehabilitation

## 2019-08-02 VITALS — BP 185/78 | HR 56 | Temp 98.5°F | Ht 63.0 in | Wt 178.0 lb

## 2019-08-02 DIAGNOSIS — G8929 Other chronic pain: Secondary | ICD-10-CM

## 2019-08-02 DIAGNOSIS — R269 Unspecified abnormalities of gait and mobility: Secondary | ICD-10-CM | POA: Insufficient documentation

## 2019-08-02 DIAGNOSIS — M791 Myalgia, unspecified site: Secondary | ICD-10-CM

## 2019-08-02 DIAGNOSIS — G894 Chronic pain syndrome: Secondary | ICD-10-CM | POA: Insufficient documentation

## 2019-08-02 DIAGNOSIS — M545 Low back pain: Secondary | ICD-10-CM | POA: Diagnosis not present

## 2019-08-02 NOTE — Progress Notes (Addendum)
Subjective:    Patient ID: Hannah Fischer, female    DOB: [birth year], 86 y.o.   MRN: [id number]  HPI Female with pmh/psh of seizures, PE, DM with neuropathy, DJD, solitary kidney, TIA, left shoulder OA, lumbar spinal stensosis, b/l shoulder surgery presents for follow up for low back pain.  Initially stated: Started ~2012.   Denies inciting event.  Stable, but exacerbated after fall.  Meds improve the pain.  Standing, ambulation exacerbate the pain.  Achy pain.  Constant.  Radiates at times to left calf.  Has not tried anything.  Associated LLE weakness, numbness, tingling.  Pain prevents from doing ADLs.  2-3 falls in the last year.  She was prescribed Xanax for sleep by PCP.  Last clinic visit on 04/29/19. Since that time, pt states she is using TENS with benefit. She states 1 medication makes her sweat "the green one", but does not recall which one.  She did not go to the pool for exercise. Denies falls.  Pain Inventory Average Pain 0 Pain Right Now 0 My pain is na  In the last 24 hours, has pain interfered with the following? General activity 0 Relation with others 0 Enjoyment of life 0 What TIME of day is your pain at its worst? na Sleep (in general) Fair  Pain is worse with: na Pain improves with: na Relief from Meds: na  Mobility walk with assistance use a cane ability to climb steps?  yes do you drive?  yes transfers alone  Function retired  Neuro/Psych bladder control problems bowel control problems anxiety  Prior Studies Any changes since last visit?  no CT/MRI  Physicians involved in your care Primary care Dr. [name]   Family History  Problem Relation Age of Onset  . Diabetes Mother   . Heart disease Mother   . Heart disease Sister   . Heart disease Brother    Social History   Socioeconomic History  . Marital status: Married    Spouse name: Not on file  . Number of children: Not on file  . Years of education: Not on file  . Highest  education level: Not on file  Occupational History  . Not on file  Tobacco Use  . Smoking status: Never Smoker  . Smokeless tobacco: Never Used  Substance and Sexual Activity  . Alcohol use: No  . Drug use: No  . Sexual activity: Never    Birth control/protection: Post-menopausal  Other Topics Concern  . Not on file  Social History Narrative  . Not on file   Social Determinants of Health   Financial Resource Strain:   . Difficulty of Paying Living Expenses:   Food Insecurity:   . Worried About Charity fundraiser in the Last Year:   . Arboriculturist in the Last Year:   Transportation Needs:   . Film/video editor (Medical):   Marland Kitchen Lack of Transportation (Non-Medical):   Physical Activity:   . Days of Exercise per Week:   . Minutes of Exercise per Session:   Stress:   . Feeling of Stress :   Social Connections:   . Frequency of Communication with Friends and Family:   . Frequency of Social Gatherings with Friends and Family:   . Attends Religious Services:   . Active Member of Clubs or Organizations:   . Attends Archivist Meetings:   Marland Kitchen Marital Status:    Past Surgical History:  Procedure Laterality Date  . BACK SURGERY    .  BLEPHAROPLASTY  2012   rt eye  . CARDIAC CATHETERIZATION     had one 10/2013  . CHOLECYSTECTOMY    . EYE SURGERY  2007   cataract ext/ iol rt eye  . JOINT REPLACEMENT  2001,2005   both knees  . LEFT HEART CATHETERIZATION WITH CORONARY ANGIOGRAM N/A 10/18/2013   Procedure: LEFT HEART CATHETERIZATION WITH CORONARY ANGIOGRAM;  Surgeon: Corky Crafts, MD;  Location: [facility] CATH LAB;  Service: Cardiovascular;  Laterality: N/A;  . LUMBAR DISC SURGERY  1990  . REVERSE SHOULDER ARTHROPLASTY Left 12/03/2013   Procedure: LEFT SHOULDER REVERSE ARTHROPLASTY;  Surgeon: Verlee Rossetti, MD;  Location: [facility] OR;  Service: Orthopedics;  Laterality: Left;  . SHOULDER HEMI-ARTHROPLASTY  12/25/2010   Procedure: SHOULDER HEMI-ARTHROPLASTY;  Surgeon:  Cammy Copa;  Location: [facility] OR;  Service: Orthopedics;  Laterality: Right;   Past Medical History:  Diagnosis Date  . Abnormal nuclear stress test    2005, normal cath Dr. Elease Hashimoto, normal stress test 2012 Dr. Eldridge Dace  . Anemia    chronic  . Anxiety   . Arthritis    djd  . Arthritis of shoulder region, left, degenerative 12/03/2013  . Blood transfusion 1979   for anemia  . Chest pain    evaluated @ [facility] cardiology; had a stress test 2012  . Chronic pain syndrome 04/29/2019  . Chronic right-sided low back pain without sciatica 04/12/2019  . Complication of anesthesia    unable to breathe lying  flat on back  . Diabetes mellitus    type 2  niddm x 25 yrs  . Diabetes mellitus ([facility]) 11/15/2011  . Diverticular disease   . DJD (degenerative joint disease)    lumbar and cervical  . GERD (gastroesophageal reflux disease)   . Hyperlipemia   . Hypertension    on medication  . Hypothyroidism    takes levoxyl  . Irritable bowel syndrome   . 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 Neuromuscular disorder ([facility])   . Neuropathy in diabetes ([facility])   . Nonspecific abnormal electrocardiogram (ECG) (EKG) 10/15/2013  . Other and unspecified angina pectoris    tests came back NOT heart related  . Pulmonary embolism ([facility]) 06-2010   bilateral  . Pyuria 11/15/2011  . Seizures ([facility])    once, age 8  . TIA (transient ischemic attack) 11/15/2011   BP (!) 185/78   Pulse (!) 56   Temp 98.5 F (36.9 C)   Ht 5\' 3"  (1.6 m)   Wt 178 lb (80.7 kg)   SpO2 96%   BMI 31.53 kg/m   Opioid Risk Score:   Fall Risk Score:  `1  Depression screen PHQ 2/9  No flowsheet data found.  Review of Systems  Constitutional: Positive for diaphoresis.  HENT: Negative.   Eyes: Negative.   Respiratory: Negative.   Cardiovascular: Negative.   Gastrointestinal: Negative.   Endocrine:       High blood sugar  Genitourinary: Negative.   Musculoskeletal: Positive for arthralgias, back pain and gait  problem.  Skin: Negative.   Allergic/Immunologic: Negative.   Neurological: Positive for dizziness.  Hematological: Negative.   Psychiatric/Behavioral: The patient is nervous/anxious.       Objective:   Physical Exam Gen: NAD.  MSK:  Gait: Kyphotic  No TTP PSPs and medial gluteal muscles   No edema.   Gait: Mildly antalgic Neuro: Alert  HOH  Strength  4+/5 B/l LE HF, 4+/5 KE, 5/5 ADF/PF  Assessment & Plan:  Female with pmh/psh of seizures, PE, DM with neuropathy, DJD, solitary kidney, TIA, left shoulder OA, lumbar spinal stensosis, b/l shoulder surgery presents for follow up for low back pain  1. Chronic mechanical low back pain - some low back pain is baseline due to her undeveloped right kidney per patient             MRI 07/2016 reviewed, showing L4-5 stenosis with facet arthropathy.               Pt states she has had ESIs without benefit             Will try to avoid NSAIDs due to solitary kidney             Pt told not to use Voltaren gel by Neurosurg             Encouraged Heat PRN             Continue TENS IT              Cont bracing for periods of excessive activity              Continue Robaxin 500mg  TID PRN, taking 1/day             Will consider Gabapentin to 300 TID             Pt could not afford aquatic therapy after 1 visit, encouraged follow up at local pool, reminded again             Continue Cymbalta 30mg , educated on signs/symptoms of serotonin syndrome              Will consider facet PNS if necessary   2. Gait abnormality             Continue cane for safety, previously educated on environmental modifications to limit falls  Doing better  3. Diabetic neuropathy             Confounding picture             See #1  4. Myalgias             Will consider trigger point injections in future if necessary, relatively controlled today

## 2019-08-13 DIAGNOSIS — R350 Frequency of micturition: Secondary | ICD-10-CM | POA: Diagnosis not present

## 2019-08-13 DIAGNOSIS — N3946 Mixed incontinence: Secondary | ICD-10-CM | POA: Diagnosis not present

## 2019-08-18 ENCOUNTER — Other Ambulatory Visit: Payer: Self-pay

## 2019-08-18 ENCOUNTER — Ambulatory Visit: Payer: Medicare Other | Admitting: Neurology

## 2019-08-18 DIAGNOSIS — I639 Cerebral infarction, unspecified: Secondary | ICD-10-CM

## 2019-08-18 DIAGNOSIS — R42 Dizziness and giddiness: Secondary | ICD-10-CM

## 2019-08-19 NOTE — Procedures (Signed)
   HISTORY: 86 year old female presented with intermittent dizziness, TECHNIQUE:  This is a routine 16 channel EEG recording with one channel devoted to a limited EKG recording.  It was performed during wakefulness, drowsiness and asleep.  Hyperventilation and photic stimulation were performed as activating procedures.  There are minimum muscle and movement artifact noted.  Upon maximum arousal, posterior dominant waking rhythm consistent of rhythmic alpha range activity, with frequency of 8 hz. Activities are symmetric over the bilateral posterior derivations and attenuated with eye opening.  Hyperventilation produced mild/moderate buildup with higher amplitude and the slower activities noted.  Photic stimulation did not alter the tracing.  During EEG recording, patient developed drowsiness and no deeper stage of sleep was achieved During EEG recording, there was no epileptiform discharge noted.  EKG demonstrate sinus rhythm, with heart rate of 48 bpm.  CONCLUSION: This is a  normal awake EEG.  There is no electrodiagnostic evidence of epileptiform discharge.  Levert Feinstein, M.D. Ph.D.  [facility] [address] [place], [place] [address] Phone: [phone] Fax:      [phone]

## 2019-08-25 ENCOUNTER — Other Ambulatory Visit: Payer: Self-pay | Admitting: Physical Medicine & Rehabilitation

## 2019-09-01 DIAGNOSIS — G459 Transient cerebral ischemic attack, unspecified: Secondary | ICD-10-CM | POA: Diagnosis not present

## 2019-09-01 DIAGNOSIS — E039 Hypothyroidism, unspecified: Secondary | ICD-10-CM | POA: Diagnosis not present

## 2019-09-01 DIAGNOSIS — I10 Essential (primary) hypertension: Secondary | ICD-10-CM | POA: Diagnosis not present

## 2019-09-01 DIAGNOSIS — M47896 Other spondylosis, lumbar region: Secondary | ICD-10-CM | POA: Diagnosis not present

## 2019-09-13 ENCOUNTER — Telehealth: Payer: Self-pay | Admitting: Neurology

## 2019-09-13 NOTE — Telephone Encounter (Signed)
Pt left a voicemail asking for a call with the results to her EEG

## 2019-09-14 NOTE — Telephone Encounter (Signed)
I reached out to the pt and advised of results.   08/18/2019 Dr. Terrace Arabia  CONCLUSION: This is a  normal awake EEG.  There is no electrodiagnostic evidence of epileptiform discharge.  Pt verbalized understanding and had no questions/concerns.

## 2019-09-20 ENCOUNTER — Ambulatory Visit: Payer: Medicare Other | Admitting: Neurology

## 2019-09-30 DIAGNOSIS — G459 Transient cerebral ischemic attack, unspecified: Secondary | ICD-10-CM | POA: Diagnosis not present

## 2019-09-30 DIAGNOSIS — I10 Essential (primary) hypertension: Secondary | ICD-10-CM | POA: Diagnosis not present

## 2019-09-30 DIAGNOSIS — E039 Hypothyroidism, unspecified: Secondary | ICD-10-CM | POA: Diagnosis not present

## 2019-09-30 DIAGNOSIS — M47896 Other spondylosis, lumbar region: Secondary | ICD-10-CM | POA: Diagnosis not present

## 2019-10-25 DIAGNOSIS — E039 Hypothyroidism, unspecified: Secondary | ICD-10-CM | POA: Diagnosis not present

## 2019-10-25 DIAGNOSIS — G459 Transient cerebral ischemic attack, unspecified: Secondary | ICD-10-CM | POA: Diagnosis not present

## 2019-10-25 DIAGNOSIS — I10 Essential (primary) hypertension: Secondary | ICD-10-CM | POA: Diagnosis not present

## 2019-10-25 DIAGNOSIS — M47896 Other spondylosis, lumbar region: Secondary | ICD-10-CM | POA: Diagnosis not present

## 2019-10-26 ENCOUNTER — Other Ambulatory Visit: Payer: Self-pay

## 2019-10-26 ENCOUNTER — Encounter: Payer: Self-pay | Admitting: Neurology

## 2019-10-26 ENCOUNTER — Ambulatory Visit: Payer: Medicare Other | Admitting: Neurology

## 2019-10-26 VITALS — BP 152/73 | HR 56 | Ht 63.0 in | Wt 177.5 lb

## 2019-10-26 DIAGNOSIS — I639 Cerebral infarction, unspecified: Secondary | ICD-10-CM

## 2019-10-26 DIAGNOSIS — R42 Dizziness and giddiness: Secondary | ICD-10-CM

## 2019-10-26 DIAGNOSIS — M48062 Spinal stenosis, lumbar region with neurogenic claudication: Secondary | ICD-10-CM

## 2019-10-26 NOTE — Progress Notes (Signed)
PATIENT: Hannah Fischer DOB: Jan 09, 1933  Chief Complaint  Patient presents with  . Dizziness    She is here with her husband, Hannah Fischer. Her dizziness has improved some. She has continued taking Plavix 71m, one tablet daily. She would like to review her test results (MRI brain, MRA brain & neck, EEG).     HISTORICAL  Hannah KASAis a 86year old female, seen in request by her primary care doctor MKathyrn Lassfor evaluation of dizziness, initial evaluation was on Jun 15, 2019,  I have reviewed and summarized the referring note from the referring physician.  She has past medical history of hypothyroidism, on supplement, diabetes, hypertension, she had a history of bilateral shoulder and knee replacement, ambulate with mild difficulty, using cane for longer distance, she enjoys bowling with her husband on a weekly basis  Since October 2020, she began to notice intermittent dizziness, she is a poor historian, she describes dizziness woozy headedness when getting up from seated down position, which has been present frequently, almost daily basis, in addition, she complains of transient spinning sensation when lying down from sitting position.  Today she has significant orthostatic blood pressure changes, blood pressure lying down 207/73, heart rate of 58, sitting up 204/84, heart rate of 49, standing up 158/89, heart rate of 55,  She has intermittent bilateral feet paresthesia, complains of low back pain,  I personally reviewed MRI of lumbar in June 2018: Degenerative changes of lumbar spine, with severe central canal stenosis at L4-5, marked with narrowing in the subarticular recess, worse on the right, marked convex right scoliosis  MRI of the brain in 2013, generalized atrophy, supratentorium small vessel disease  MRA of the brain, 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  Laboratory evaluation in 2021, CBC showed hemoglobin of 12, CMP showed glucose of 120, creatinine of 0.94,  UPDATE Sept 21 2021: She is accompanied by her husband at today's clinical visit, her symptoms overall has much improved, she no longer have significant positional related dizziness, no vertigo  Today's blood pressure has improved as well, sitting down 137/75, 52; standing up 138/69, heart rate of 58  She was seen by Dr. SMarlou Porchin May 2021 for evaluation of shortness of breath with exertion, atypical chest pain, echocardiogram showed normal ejection fraction,  No significant abnormality on perfusion study  She denies a history of stroke, I personally reviewed MRA of neck with without contrast in June 2021, no significant bilateral internal carotid artery stenosis, left vertebral artery is dominant, right vertebral artery hypoplasia, several focus of atherosclerotic irregularity,  MRI of the brain showed chronic left parieto-occipital ischemic infarction, this is new since previous scan in 2013, numerous chronic cerebral microhemorrhage, also foci of sulcal hemosiderin depolarization in the right frontal left parietal region, she is now on Plavix 75 mg daily  Laboratory evaluations in May 2021, mild elevated A1c 6.9, otherwise normal or negative TSH, CPK, RPR, B12, ANA, ESR, C-reactive protein, vitamin D level  She did have a history of chronic low back pain, over the years, has developed gradual onset bilateral ankle weakness, mild unsteady gait  I personally reviewed MRI lumbar in December 2015, some progression of degenerative disease at L4-5 where there is moderately severe central canal stenosis and marked narrowing in the subarticular recesses, worse on the right, due to a broad-based central protrusion and bulky ligamentum flavum  thickening. Advanced facet degenerative disease at this level results in 0.5 cm anterolisthesis.  No marked  change in mild to moderate central canal and bilateral foraminal narrowing L3-4.  REVIEW OF SYSTEMS: Full 14 system review of systems performed and notable only for as above All other review of systems were negative.  ALLERGIES: Allergies  Allergen Reactions  . Codeine Anxiety  . Sulfites Other (See Comments)    Abdominal pain  . Zocor [Simvastatin] Other (See Comments)    Muscle weakness    HOME MEDICATIONS: Current Outpatient Medications  Medication Sig Dispense Refill  . ALPRAZolam (XANAX) 0.25 MG tablet Take 0.25 mg by mouth at bedtime as needed for sleep.  0  . cephALEXin (KEFLEX) 500 MG capsule Take 1 capsule (500 mg total) by mouth 2 (two) times daily. 10 capsule 0  . Cholecalciferol (VITAMIN D3) 2000 UNITS TABS Take 2,000 Units by mouth daily.     . ciprofloxacin (CIPRO) 250 MG tablet Take 250 mg by mouth 2 (two) times daily.  0  . clopidogrel (PLAVIX) 75 MG tablet Take 1 tablet (75 mg total) by mouth daily with breakfast. 30 tablet 1  . Cyanocobalamin (VITAMIN B-12 PO) Take 1 tablet by mouth daily.     . DULoxetine (CYMBALTA) 30 MG capsule TAKE 1 CAPSULE(30 MG) BY MOUTH DAILY 30 capsule 2  . levothyroxine (SYNTHROID, LEVOTHROID) 100 MCG tablet Take 1 tablet (100 mcg total) by mouth daily. 30 tablet 1  . losartan (COZAAR) 100 MG tablet Take 100 mg by mouth daily.    . methocarbamol (ROBAXIN) 500 MG tablet Take 1 tablet (500 mg total) by mouth every 8 (eight) hours as needed for muscle spasms. 90 tablet 1  . Omega-3 Fatty Acids (FISH OIL) 1000 MG CAPS Take 1 capsule by mouth 2 (two) times daily.     . pioglitazone (ACTOS) 45 MG tablet Take 45 mg by mouth daily.      . propranolol (INDERAL) 40 MG tablet Take 40 mg by mouth daily.   1  . traZODone (DESYREL) 50 MG tablet TAKE 1 TABLET BY MOUTH AT BEDTIME 90 tablet 0   No current facility-administered medications for this visit.    PAST MEDICAL HISTORY: Past Medical History:  Diagnosis Date  . Abnormal nuclear stress test     2005, normal cath Dr. Acie Fredrickson, normal stress test 2012 Dr. Irish Lack  . Anemia    chronic  . Anxiety   . Arthritis    djd  . Arthritis of shoulder region, left, degenerative 12/03/2013  . Blood transfusion 1979   for anemia  . Chest pain    evaluated @ [facility] cardiology; had a stress test 2012  . Chronic pain syndrome 04/29/2019  . Chronic right-sided low back pain without sciatica 04/12/2019  . Complication of anesthesia    unable to breathe lying  flat on back  . Diabetes mellitus    type 2  niddm x 25 yrs  . Diabetes mellitus ([place]) 11/15/2011  . Diverticular disease   . DJD (degenerative joint disease)    lumbar and cervical  . GERD (gastroesophageal reflux disease)   . Hyperlipemia   . Hypertension    on medication  . Hypothyroidism    takes levoxyl  . Irritable bowel syndrome   . 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 Neuromuscular disorder ([place])   . Neuropathy in diabetes ([place])   . Nonspecific abnormal electrocardiogram (ECG) (EKG) 10/15/2013  . Other and unspecified angina pectoris  tests came back NOT heart related  . Pulmonary embolism ([place]) 06-2010   bilateral  . Pyuria 11/15/2011  . Seizures ([place])    once, age 27  . TIA (transient ischemic attack) 11/15/2011    PAST SURGICAL HISTORY: Past Surgical History:  Procedure Laterality Date  . BACK SURGERY    . BLEPHAROPLASTY  2012   rt eye  . CARDIAC CATHETERIZATION     had one 10/2013  . CHOLECYSTECTOMY    . EYE SURGERY  2007   cataract ext/ iol rt eye  . JOINT REPLACEMENT  2001,2005   both knees  . LEFT HEART CATHETERIZATION WITH CORONARY ANGIOGRAM N/A 10/18/2013   Procedure: LEFT HEART CATHETERIZATION WITH CORONARY ANGIOGRAM;  Surgeon: Jettie Booze, MD;  Location[facility] CATH LAB;  Service: Cardiovascular;  Laterality: N/A;  . LUMBAR [place]  . REVERSE SHOULDER ARTHROPLASTY Left 12/03/2013   Procedure: LEFT SHOULDER REVERSE ARTHROPLASTY;  Surgeon: Augustin Schooling, MD;  Location:  [place];  Service: Orthopedics;  Laterality: Left;  . SHOULDER HEMI-ARTHROPLASTY  12/25/2010   Procedure: SHOULDER HEMI-ARTHROPLASTY;  Surgeon: Meredith Pel;  Location: [place];  Service: Orthopedics;  Laterality: Right;    FAMILY HISTORY: Family History  Problem Relation Age of Onset  . Diabetes Mother   . Heart disease Mother   . Heart disease Sister   . Heart disease Brother     SOCIAL HISTORY: Social History   Socioeconomic History  . Marital status: Married    Spouse name: Not on file  . Number of children: Not on file  . Years of education: Not on file  . Highest education level: Not on file  Occupational History  . Not on file  Tobacco Use  . Smoking status: Never Smoker  . Smokeless tobacco: Never Used  Substance and Sexual Activity  . Alcohol use: No  . Drug use: No  . Sexual activity: Never    Birth control/protection: Post-menopausal  Other Topics Concern  . Not on file  Social History Narrative  . Not on file   Social Determinants of Health   Financial Resource Strain:   . Difficulty of Paying Living Expenses: Not on file  Food Insecurity:   . Worried About Charity fundraiser in the Last Year: Not on file  . Ran Out of Food in the Last Year: Not on file  Transportation Needs:   . Lack of Transportation (Medical): Not on file  . Lack of Transportation (Non-Medical): Not on file  Physical Activity:   . Days of Exercise per Week: Not on file  . Minutes of Exercise per Session: Not on file  Stress:   . Feeling of Stress : Not on file  Social Connections:   . Frequency of Communication with Friends and Family: Not on file  . Frequency of Social Gatherings with Friends and Family: Not on file  . Attends Religious Services: Not on file  . Active Member of Clubs or Organizations: Not on file  . Attends Archivist Meetings: Not on file  . Marital Status: Not on file  Intimate Partner Violence:   . Fear of Current or Ex-Partner: Not on file   . Emotionally Abused: Not on file  . Physically Abused: Not on file  . Sexually Abused: Not on file     PHYSICAL EXAM   Vitals:   10/26/19 1422  BP: (!) 152/73  Pulse: (!) 56  Weight: 177 lb 8 oz (80.5 kg)  Height: 5'  3" (1.6 m)   Not recorded    Blood pressure lying down 207/73, heart rate of 58, sitting up 204/85, heart rate of 49, standing up 158/89 heart rate of 55 Body mass index is 31.44 kg/m.  PHYSICAL EXAMNIATION:  Gen: NAD, conversant, well nourised, well groomed                     Cardiovascular: Regular rate rhythm, no peripheral edema, warm, nontender. Eyes: Conjunctivae clear without exudates or hemorrhage Neck: Supple, no carotid bruits. Pulmonary: Clear to auscultation bilaterally   NEUROLOGICAL EXAM:  MENTAL STATUS: Speech/cognition: Awake alert oriented to history taking and care of conversation  CRANIAL NERVES: CN II: Visual fields are full to confrontation. Pupils are round equal and briskly reactive to light. CN III, IV, VI: extraocular movement are normal. No ptosis. CN V: Facial sensation is intact to light touch CN VII: Face is symmetric with normal eye closure  CN VIII: Hearing is normal to causal conversation. CN IX, X: Phonation is normal. CN XI: Head turning and shoulder shrug are intact  MOTOR: She has mild bilateral ankle dorsiflexion weakness, left worse than right  REFLEXES: Reflexes are hypoactive and symmetric at the biceps, triceps, knees, and ankles. Plantar responses are flexor.  SENSORY: Intact to light touch, pinprick and vibratory sensation are intact in fingers and toes.  COORDINATION: There is no trunk or limb dysmetria noted.  GAIT/STANCE: She needs push-up to get up from seated position, mildly antalgic, difficulty clear floor bilaterally Romberg is absent.   DIAGNOSTIC DATA (LABS, IMAGING, TESTING) - I reviewed patient records, labs, notes, testing and imaging myself where available.   ASSESSMENT AND  PLAN  Hannah Fischer is a 86 y.o. female   Stroke  Patient denies a history of focal neurological event,  Stroke was confirmed by MRI of the brain in June 2021, chronic left parieto-occipital ischemic infarction, numerous chronic cerebral microhemorrhage, also sulcal hemosiderin deposition in the right frontal and left parietal region, consistent with amyloid angiopathy, no acute abnormalities  MRA of neck with without contrast showed no significant bilateral internal carotid artery disease, left vertebral artery is dominant, right vertebral artery is hypoplastic,  She does has vascular risk factor of aging, hypertension, diabetes,  Continue Plavix 75 mg daily,  Gradual onset gait abnormality,  Exam showed bilateral distal ankle weakness, left worse than right,  MRI of lumbar spine in 2018 showed moderate severe central canal stenosis L4-5, marked narrowing in the subarticular recess, worse on the right side   She does complains of gradual onset gait abnormality, urinary urgency, occasionally incontinence, but denies significant pain, does not want to consider further evaluation no surgical intervention at this time  Marcial Pacas, M.D. Ph.D.  [facility] [address] [place], [place] [address] Ph: [phone] Fax: [phone]  CC: [name], MD

## 2019-11-19 DIAGNOSIS — Z23 Encounter for immunization: Secondary | ICD-10-CM | POA: Diagnosis not present

## 2019-11-23 ENCOUNTER — Emergency Department (HOSPITAL_COMMUNITY): Payer: Medicare Other

## 2019-11-23 ENCOUNTER — Other Ambulatory Visit: Payer: Self-pay

## 2019-11-23 ENCOUNTER — Encounter (HOSPITAL_COMMUNITY): Payer: Self-pay

## 2019-11-23 ENCOUNTER — Inpatient Hospital Stay (HOSPITAL_COMMUNITY)
Admission: EM | Admit: 2019-11-23 | Discharge: 2019-11-26 | DRG: 065 | Disposition: A | Discharge status: Home / Self Care | Payer: Medicare Other | Attending: Internal Medicine | Admitting: Internal Medicine

## 2019-11-23 DIAGNOSIS — E669 Obesity, unspecified: Secondary | ICD-10-CM | POA: Diagnosis present

## 2019-11-23 DIAGNOSIS — G8324 Monoplegia of upper limb affecting left nondominant side: Secondary | ICD-10-CM | POA: Diagnosis present

## 2019-11-23 DIAGNOSIS — Z96612 Presence of left artificial shoulder joint: Secondary | ICD-10-CM | POA: Diagnosis present

## 2019-11-23 DIAGNOSIS — E119 Type 2 diabetes mellitus without complications: Secondary | ICD-10-CM

## 2019-11-23 DIAGNOSIS — R8281 Pyuria: Secondary | ICD-10-CM | POA: Diagnosis present

## 2019-11-23 DIAGNOSIS — N179 Acute kidney failure, unspecified: Secondary | ICD-10-CM | POA: Diagnosis present

## 2019-11-23 DIAGNOSIS — Z794 Long term (current) use of insulin: Secondary | ICD-10-CM

## 2019-11-23 DIAGNOSIS — Z6831 Body mass index (BMI) 31.0-31.9, adult: Secondary | ICD-10-CM

## 2019-11-23 DIAGNOSIS — Q600 Renal agenesis, unilateral: Secondary | ICD-10-CM

## 2019-11-23 DIAGNOSIS — Z7902 Long term (current) use of antithrombotics/antiplatelets: Secondary | ICD-10-CM

## 2019-11-23 DIAGNOSIS — Z79890 Hormone replacement therapy (postmenopausal): Secondary | ICD-10-CM

## 2019-11-23 DIAGNOSIS — Z7984 Long term (current) use of oral hypoglycemic drugs: Secondary | ICD-10-CM

## 2019-11-23 DIAGNOSIS — Z79899 Other long term (current) drug therapy: Secondary | ICD-10-CM

## 2019-11-23 DIAGNOSIS — Z86711 Personal history of pulmonary embolism: Secondary | ICD-10-CM

## 2019-11-23 DIAGNOSIS — Z8673 Personal history of transient ischemic attack (TIA), and cerebral infarction without residual deficits: Secondary | ICD-10-CM

## 2019-11-23 DIAGNOSIS — E039 Hypothyroidism, unspecified: Secondary | ICD-10-CM | POA: Diagnosis present

## 2019-11-23 DIAGNOSIS — I10 Essential (primary) hypertension: Secondary | ICD-10-CM | POA: Diagnosis not present

## 2019-11-23 DIAGNOSIS — I63412 Cerebral infarction due to embolism of left middle cerebral artery: Principal | ICD-10-CM | POA: Diagnosis present

## 2019-11-23 DIAGNOSIS — Z833 Family history of diabetes mellitus: Secondary | ICD-10-CM

## 2019-11-23 DIAGNOSIS — Z20822 Contact with and (suspected) exposure to covid-19: Secondary | ICD-10-CM | POA: Diagnosis present

## 2019-11-23 DIAGNOSIS — I63432 Cerebral infarction due to embolism of left posterior cerebral artery: Secondary | ICD-10-CM | POA: Diagnosis present

## 2019-11-23 DIAGNOSIS — E854 Organ-limited amyloidosis: Secondary | ICD-10-CM | POA: Diagnosis present

## 2019-11-23 DIAGNOSIS — I639 Cerebral infarction, unspecified: Secondary | ICD-10-CM | POA: Diagnosis not present

## 2019-11-23 DIAGNOSIS — R202 Paresthesia of skin: Secondary | ICD-10-CM | POA: Diagnosis present

## 2019-11-23 DIAGNOSIS — I680 Cerebral amyloid angiopathy: Secondary | ICD-10-CM | POA: Diagnosis not present

## 2019-11-23 DIAGNOSIS — E785 Hyperlipidemia, unspecified: Secondary | ICD-10-CM | POA: Diagnosis present

## 2019-11-23 DIAGNOSIS — Z96653 Presence of artificial knee joint, bilateral: Secondary | ICD-10-CM | POA: Diagnosis present

## 2019-11-23 DIAGNOSIS — E1140 Type 2 diabetes mellitus with diabetic neuropathy, unspecified: Secondary | ICD-10-CM | POA: Diagnosis present

## 2019-11-23 DIAGNOSIS — I69314 Frontal lobe and executive function deficit following cerebral infarction: Secondary | ICD-10-CM | POA: Diagnosis not present

## 2019-11-23 DIAGNOSIS — R29810 Facial weakness: Secondary | ICD-10-CM | POA: Diagnosis not present

## 2019-11-23 DIAGNOSIS — E1165 Type 2 diabetes mellitus with hyperglycemia: Secondary | ICD-10-CM | POA: Diagnosis present

## 2019-11-23 DIAGNOSIS — Z8249 Family history of ischemic heart disease and other diseases of the circulatory system: Secondary | ICD-10-CM

## 2019-11-23 LAB — COMPREHENSIVE METABOLIC PANEL
ALT: 9 U/L (ref 0–44)
AST: 21 U/L (ref 15–41)
Albumin: 3.7 g/dL (ref 3.5–5.0)
Alkaline Phosphatase: 73 U/L (ref 38–126)
Anion gap: 10 (ref 5–15)
BUN: 25 mg/dL — ABNORMAL HIGH (ref 8–23)
CO2: 27 mmol/L (ref 22–32)
Calcium: 8.9 mg/dL (ref 8.9–10.3)
Chloride: 100 mmol/L (ref 98–111)
Creatinine, Ser: 1.29 mg/dL — ABNORMAL HIGH (ref 0.44–1.00)
GFR, Estimated: 38 mL/min — ABNORMAL LOW (ref >60)
Glucose, Bld: 328 mg/dL — ABNORMAL HIGH (ref 70–99)
Potassium: 4.2 mmol/L (ref 3.5–5.1)
Sodium: 137 mmol/L (ref 135–145)
Total Bilirubin: 0.9 mg/dL (ref 0.3–1.2)
Total Protein: 6.8 g/dL (ref 6.5–8.1)

## 2019-11-23 LAB — DIFFERENTIAL
Abs Immature Granulocytes: 0 10*3/uL (ref 0.00–0.07)
Basophils Absolute: 0 10*3/uL (ref 0.0–0.1)
Basophils Relative: 1 %
Eosinophils Absolute: 0.1 10*3/uL (ref 0.0–0.5)
Eosinophils Relative: 3 %
Immature Granulocytes: 0 %
Lymphocytes Relative: 30 %
Lymphs Abs: 1.2 10*3/uL (ref 0.7–4.0)
Monocytes Absolute: 0.5 10*3/uL (ref 0.1–1.0)
Monocytes Relative: 12 %
Neutro Abs: 2.2 10*3/uL (ref 1.7–7.7)
Neutrophils Relative %: 54 %

## 2019-11-23 LAB — CBC
HCT: 38.8 % (ref 36.0–46.0)
Hemoglobin: 12.6 g/dL (ref 12.0–15.0)
MCH: 32.2 pg (ref 26.0–34.0)
MCHC: 32.5 g/dL (ref 30.0–36.0)
MCV: 99.2 fL (ref 80.0–100.0)
Platelets: 219 10*3/uL (ref 150–400)
RBC: 3.91 MIL/uL (ref 3.87–5.11)
RDW: 13.2 % (ref 11.5–15.5)
WBC: 4 10*3/uL (ref 4.0–10.5)
nRBC: 0 % (ref 0.0–0.2)

## 2019-11-23 LAB — I-STAT CHEM 8, ED
BUN: 25 mg/dL — ABNORMAL HIGH (ref 8–23)
Calcium, Ion: 1.11 mmol/L — ABNORMAL LOW (ref 1.15–1.40)
Chloride: 99 mmol/L (ref 98–111)
Creatinine, Ser: 1.3 mg/dL — ABNORMAL HIGH (ref 0.44–1.00)
Glucose, Bld: 326 mg/dL — ABNORMAL HIGH (ref 70–99)
HCT: 38 % (ref 36.0–46.0)
Hemoglobin: 12.9 g/dL (ref 12.0–15.0)
Potassium: 4 mmol/L (ref 3.5–5.1)
Sodium: 140 mmol/L (ref 135–145)
TCO2: 32 mmol/L (ref 22–32)

## 2019-11-23 LAB — PROTIME-INR
INR: 0.9 (ref 0.8–1.2)
Prothrombin Time: 12.1 seconds (ref 11.4–15.2)

## 2019-11-23 LAB — APTT: aPTT: 27 seconds (ref 24–36)

## 2019-11-23 NOTE — ED Provider Notes (Signed)
[facility]-EMERGENCY DEPT Provider Note   CSN: [id number] Arrival date & time: 11/23/19  2231     History Chief Complaint  Patient presents with  . Stroke Symptoms    Hannah Fischer is a 86 y.o. female with a history of prior CVA & TIA, DM, prior PE, anemia, anxiety, hypertension, & hyperlipidemia who presents to the emergency department with concern for possible stroke symptoms that began yesterday at 10 AM.  Patient states that since yesterday morning her left upper extremity has had paresthesias as well as weakness which she further describes as not being able to make it do what she wants to, left lower extremity heaviness, as well as an abnormal sensation to the lower portion of her face right greater than left.  There are no specific alleviating or aggravating factors to her symptoms.  No intervention prior to arrival.  Her symptoms seemed a bit better this evening but then worsened again, she never had complete resolution.  The left hand feels a bit sore.  She denies any recent injuries.  Denies visual disturbance, dizziness like the room spinning, slurred speech, syncope, chest pain, shortness of breath, or fevers.  She has had prior stroke in the past, however she does not think she has any residual deficits from this.   HPI     Past Medical History:  Diagnosis Date  . Abnormal nuclear stress test    2005, normal cath Dr. Elease HashimotoNahser, normal stress test 2012 Dr. Eldridge DaceVaranasi  . Anemia    chronic  . Anxiety   . Arthritis    djd  . Arthritis of shoulder region, left, degenerative 12/03/2013  . Blood transfusion 1979   for anemia  . Chest pain    evaluated @ [facility]ology; had a stress test 2012  . Chronic pain syndrome 04/29/2019  . Chronic right-sided low back pain without sciatica 04/12/2019  . Complication of anesthesia    unable to breathe lying  flat on back  . Diabetes mellitus    type 2  niddm x 25 yrs  . Diabetes mellitus (HCC) 11/15/2011  .  Diverticular disease   . DJD (degenerative joint disease)    lumbar and cervical  . GERD (gastroesophageal reflux disease)   . Hyperlipemia   . Hypertension    on medication  . Hypothyroidism    takes levoxyl  . Irritable bowel syndrome   . 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 Neuromuscular disorder (HCC)   . Neuropathy in diabetes (HCC)   . Nonspecific abnormal electrocardiogram (ECG) (EKG) 10/15/2013  . Other and unspecified angina pectoris    tests came back NOT heart related  . Pulmonary embolism (HCC) 06-2010   bilateral  . Pyuria 11/15/2011  . Seizures (HCC)    once, age 89  . TIA (transient ischemic attack) 11/15/2011    Patient Active Problem List   Diagnosis Date Noted  . Spinal stenosis of lumbar region with neurogenic claudication 10/26/2019  . Abnormality of gait 08/02/2019  . Cerebrovascular accident (CVA) (HCC) 07/22/2019  . Dizziness 06/15/2019  . Brain aneurysm 06/15/2019  . Chronic pain syndrome 04/29/2019  . Chronic right-sided low back pain without sciatica 04/12/2019  . Arthritis of shoulder region, left, degenerative 12/03/2013  . Nonspecific abnormal electrocardiogram (ECG) (EKG) 10/15/2013  . Other and unspecified angina pectoris 10/15/2013  . TIA (transient ischemic attack) 11/15/2011  . HTN (hypertension) 11/15/2011  . Diabetes mellitus (HCC) 11/15/2011  . Hypothyroidism 11/15/2011  .  Dyslipidemia 11/15/2011  . GERD (gastroesophageal reflux disease) 11/15/2011  . Pyuria 11/15/2011    Past Surgical History:  Procedure Laterality Date  . BACK SURGERY    . BLEPHAROPLASTY  2012   rt eye  . CARDIAC CATHETERIZATION     had one 10/2013  . CHOLECYSTECTOMY    . EYE SURGERY  2007   cataract ext/ iol rt eye  . JOINT REPLACEMENT  2001,2005   both knees  . LEFT HEART CATHETERIZATION WITH CORONARY ANGIOGRAM N/A 10/18/2013   Procedure: LEFT HEART CATHETERIZATION WITH CORONARY ANGIOGRAM;  Surgeon: Corky Crafts, MD;  Location: [facility] CATH  LAB;  Service: Cardiovascular;  Laterality: N/A;  . LUMBAR DISC SURGERY  1990  . REVERSE SHOULDER ARTHROPLASTY Left 12/03/2013   Procedure: LEFT SHOULDER REVERSE ARTHROPLASTY;  Surgeon: Verlee Rossetti, MD;  Location: [facility] OR;  Service: Orthopedics;  Laterality: Left;  . SHOULDER HEMI-ARTHROPLASTY  12/25/2010   Procedure: SHOULDER HEMI-ARTHROPLASTY;  Surgeon: Cammy Copa;  Location: [facility] OR;  Service: Orthopedics;  Laterality: Right;     OB History   No obstetric history on file.     Family History  Problem Relation Age of Onset  . Diabetes Mother   . Heart disease Mother   . Heart disease Sister   . Heart disease Brother     Social History   Tobacco Use  . Smoking status: Never Smoker  . Smokeless tobacco: Never Used  Substance Use Topics  . Alcohol use: No  . Drug use: No    Home Medications Prior to Admission medications   Medication Sig Start Date End Date Taking? Authorizing Provider  ALPRAZolam Prudy Feeler) 0.25 MG tablet Take 0.25 mg by mouth at bedtime as needed for sleep. 07/02/17   [provider]  cephALEXin (KEFLEX) 500 MG capsule Take 1 capsule (500 mg total) by mouth 2 (two) times daily. 05/17/17   Molpus, John, MD  Cholecalciferol (VITAMIN D3) 2000 UNITS TABS Take 2,000 Units by mouth daily.     [provider]  ciprofloxacin (CIPRO) 250 MG tablet Take 250 mg by mouth 2 (two) times daily. 07/31/17   [provider]  clopidogrel (PLAVIX) 75 MG tablet Take 1 tablet (75 mg total) by mouth daily with breakfast. 11/17/11   Vassie Loll, MD  Cyanocobalamin (VITAMIN B-12 PO) Take 1 tablet by mouth daily.     [provider]  DULoxetine (CYMBALTA) 30 MG capsule TAKE 1 CAPSULE(30 MG) BY MOUTH DAILY 08/25/19   Marcello Fennel, MD  levothyroxine (SYNTHROID, LEVOTHROID) 100 MCG tablet Take 1 tablet (100 mcg total) by mouth daily. 11/17/11   Vassie Loll, MD  losartan (COZAAR) 100 MG tablet Take 100 mg by mouth daily. 04/29/19   [provider]  methocarbamol (ROBAXIN) 500 MG tablet Take 1 tablet (500 mg total) by mouth every 8 (eight) hours as needed for muscle spasms. 04/13/19   Marcello Fennel, MD  Omega-3 Fatty Acids (FISH OIL) 1000 MG CAPS Take 1 capsule by mouth 2 (two) times daily.     [provider]  pioglitazone (ACTOS) 45 MG tablet Take 45 mg by mouth daily.      [provider]  propranolol (INDERAL) 40 MG tablet Take 40 mg by mouth daily.  04/13/17   [provider]  traZODone (DESYREL) 50 MG tablet TAKE 1 TABLET BY MOUTH AT BEDTIME 07/01/17   Marcello Fennel, MD    Allergies    Codeine, Sulfites, and Zocor [simvastatin]  Review of Systems  Review of Systems  Constitutional: Negative for chills and fever.  Eyes: Negative for visual disturbance.  Respiratory: Negative for shortness of breath.   Cardiovascular: Negative for chest pain.  Gastrointestinal: Negative for abdominal pain, diarrhea, nausea and vomiting.  Neurological: Positive for facial asymmetry, weakness and numbness. Negative for dizziness, syncope, speech difficulty and headaches.  All other systems reviewed and are negative.   Physical Exam Updated Vital Signs BP (!) 147/74 (BP Location: Left Arm)   Pulse 76   Temp 98.2 F (36.8 C) (Oral)   Resp 18   Ht 5\' 3"  (1.6 m)   Wt 80.3 kg   SpO2 98%   BMI 31.35 kg/m   Physical Exam Vitals and nursing note reviewed.  Constitutional:      General: She is not in acute distress.    Appearance: She is well-developed. She is not toxic-appearing.  HENT:     Head: Normocephalic and atraumatic.  Eyes:     General:        Right eye: No discharge.        Left eye: No discharge.     Extraocular Movements: Extraocular movements intact.     Conjunctiva/sclera: Conjunctivae normal.     Pupils: Pupils are equal, round, and reactive to light.  Cardiovascular:     Rate and Rhythm: Normal rate and regular rhythm.  Pulmonary:     Effort: Pulmonary effort is normal.  No respiratory distress.     Breath sounds: Normal breath sounds. No wheezing, rhonchi or rales.  Abdominal:     General: There is no distension.     Palpations: Abdomen is soft.     Tenderness: There is no abdominal tenderness. There is no guarding or rebound.  Musculoskeletal:     Cervical back: Neck supple.     Comments: No significant erythema, increased warmth, or open wounds to the extremities.  No focal bony tenderness.  Skin:    General: Skin is warm and dry.     Findings: No rash.  Neurological:     Mental Status: She is alert.     Comments: Alert.  Clear speech.  CN III through XII grossly intact, with the exception that she does have a very mild left-sided facial droop, however this may be more effort related-difficult exam.  Sensation is grossly intact bilateral upper and lower extremities.  Patient has 5 out of 5 symmetric grip strength, elbow flexion/extension strength, and shoulder flexion/extension strength as well as 5 out of 5 symmetric strength with plantar dorsiflexion bilaterally.  No pronator drift noted.  She does have some trouble with coordination in the left upper extremity when attempting finger-to-nose.  She is ambulatory with a steady gait utilizing her cane that she uses at baseline.  Psychiatric:        Behavior: Behavior normal.    ED Results / Procedures / Treatments   Labs (all labs ordered are listed, but only abnormal results are displayed) Labs Reviewed  COMPREHENSIVE METABOLIC PANEL - Abnormal; Notable for the following components:      Result Value   Glucose, Bld 328 (*)    BUN 25 (*)    Creatinine, Ser 1.29 (*)    GFR, Estimated 38 (*)    All other components within normal limits  I-STAT CHEM 8, ED - Abnormal; Notable for the following components:   BUN 25 (*)    Creatinine, Ser 1.30 (*)    Glucose, Bld 326 (*)    Calcium, Ion 1.11 (*)  All other components within normal limits  PROTIME-INR  APTT  CBC  DIFFERENTIAL  ETHANOL  RAPID  URINE DRUG SCREEN, HOSP PERFORMED  URINALYSIS, ROUTINE W REFLEX MICROSCOPIC    EKG EKG Interpretation  Date/Time:  Tuesday November 23 2019 23:21:27 EDT Ventricular Rate:  72 PR Interval:    QRS Duration: 111 QT Interval:  428 QTC Calculation: 469 R Axis:   -17 Text Interpretation: Sinus rhythm Borderline left axis deviation Anterior infarct, old No acute changes No significant change since last tracing Confirmed by Derwood Kaplan [phone]) on 11/23/2019 11:48:51 PM   Radiology CT Head Wo Contrast  Result Date: 11/23/2019 CLINICAL DATA:  Left-sided facial droop. EXAM: CT HEAD WITHOUT CONTRAST TECHNIQUE: Contiguous axial images were obtained from the base of the skull through the vertex without intravenous contrast. COMPARISON:  November 15, 2011 FINDINGS: Brain: There is mild cerebral atrophy with widening of the extra-axial spaces and ventricular dilatation. There are areas of decreased attenuation within the white matter tracts of the supratentorial brain, consistent with microvascular disease changes. A small area of cortical encephalomalacia, with adjacent chronic white matter low attenuation, is seen within the left parietooccipital region. Vascular: No hyperdense vessel or unexpected calcification. Skull: Normal. Negative for fracture or focal lesion. Sinuses/Orbits: No acute finding. Other: None. IMPRESSION: 1. Generalized cerebral atrophy. 2. Small, chronic left parietooccipital infarct. 3. No acute intracranial abnormality. Electronically Signed   By: Aram Candela M.D.   On: 11/23/2019 23:24    Procedures Procedures (including critical care time)  Medications Ordered in ED Medications - No data to display  ED Course  I have reviewed the triage vital signs and the nursing notes.  Pertinent labs & imaging results that were available during my care of the patient were reviewed by me and considered in my medical decision making (see chart for details).    MDM  Rules/Calculators/A&P                         Patient presents to the ED with concern for strokelike symptoms with abnormal facial sensation as well as left upper and lower extremity deficits.  She is nontoxic, resting comfortably, her vitals are within normal limits with the exception of mildly elevated blood pressure.  Symptom onset greater than 36 hours prior therefore patient is not a code stroke candidate.  On my exam she has a questionable left-sided facial droop- suspect degree of issue of effort with this when she smiles as well as some trouble with left upper extremity finger-to-nose with poor coordination.  Otherwise no obvious deficits.  Additional history obtained:  Additional history obtained from nursing note reviewing and chart review.  EKG: No significant change compared to prior. Lab Tests:  I Ordered, reviewed, and interpreted labs, which included:  CBC: Unremarkable CMP: Hyperglycemia without acidosis or anion gap elevation.  Mildly elevated creatinine, decreased GFR. APTT/PT/INR: Within normal limits  Imaging Studies ordered:  I ordered imaging studies which included CT head without contrast, I independently visualized and interpreted imaging which showed 1. Generalized cerebral atrophy. 2. Small, chronic left parietooccipital infarct. 3. No acute intracranial abnormality.   Initially placed consult to teleneurology.  01:14: CONSULT: Spoke with teleneurology- have not seen patient yet. Will discuss w/ neurohospitalist.  01:41: CONSULT: Discussed case with neurologist Dr. Justine Null MRI and MRA which she has placed orders for, call back to neurology service once resulted, would hold off on admission until we have these results.  Given current bed availability &  boarding situation will not transfer patient ED to ED, will keep patient in the [facility] ED until AM for MRI as we do not have MRI staff until 05:30.   Patient care signed out to [facility] PA-C at change of shift  pending MRI & disposition.   This is a shared visit with supervising physician Dr. Rhunette Croft who has independently evaluated patient & provided guidance in evaluation/management/disposition, in agreement with care   Portions of this note were generated with Dragon dictation software. Dictation errors may occur despite best attempts at proofreading.  Final Clinical Impression(s) / ED Diagnoses Final diagnoses:  None    Rx / DC Orders ED Discharge Orders    None       Cherly Anderson, PA-C 11/24/19 [id number]    Derwood Kaplan, MD 12/02/19 [phone]

## 2019-11-23 NOTE — ED Triage Notes (Addendum)
Pt arrives to ER with c/o left sided facial droop. Pt reports symptoms started yesterday morning. Pt ambulatory with cane. Pt able to move left arm but says she isnt able to use it.

## 2019-11-24 ENCOUNTER — Observation Stay (HOSPITAL_COMMUNITY): Payer: Medicare Other

## 2019-11-24 ENCOUNTER — Emergency Department (HOSPITAL_COMMUNITY): Payer: Medicare Other

## 2019-11-24 DIAGNOSIS — Z96653 Presence of artificial knee joint, bilateral: Secondary | ICD-10-CM | POA: Diagnosis present

## 2019-11-24 DIAGNOSIS — I6340 Cerebral infarction due to embolism of unspecified cerebral artery: Secondary | ICD-10-CM | POA: Diagnosis not present

## 2019-11-24 DIAGNOSIS — I680 Cerebral amyloid angiopathy: Secondary | ICD-10-CM | POA: Diagnosis not present

## 2019-11-24 DIAGNOSIS — I639 Cerebral infarction, unspecified: Secondary | ICD-10-CM | POA: Diagnosis present

## 2019-11-24 DIAGNOSIS — N179 Acute kidney failure, unspecified: Secondary | ICD-10-CM | POA: Diagnosis not present

## 2019-11-24 DIAGNOSIS — Z6831 Body mass index (BMI) 31.0-31.9, adult: Secondary | ICD-10-CM | POA: Diagnosis not present

## 2019-11-24 DIAGNOSIS — Z7902 Long term (current) use of antithrombotics/antiplatelets: Secondary | ICD-10-CM | POA: Diagnosis not present

## 2019-11-24 DIAGNOSIS — Z20822 Contact with and (suspected) exposure to covid-19: Secondary | ICD-10-CM | POA: Diagnosis not present

## 2019-11-24 DIAGNOSIS — R202 Paresthesia of skin: Secondary | ICD-10-CM | POA: Diagnosis present

## 2019-11-24 DIAGNOSIS — Q600 Renal agenesis, unilateral: Secondary | ICD-10-CM | POA: Diagnosis not present

## 2019-11-24 DIAGNOSIS — Z96612 Presence of left artificial shoulder joint: Secondary | ICD-10-CM | POA: Diagnosis present

## 2019-11-24 DIAGNOSIS — G8324 Monoplegia of upper limb affecting left nondominant side: Secondary | ICD-10-CM | POA: Diagnosis not present

## 2019-11-24 DIAGNOSIS — E1149 Type 2 diabetes mellitus with other diabetic neurological complication: Secondary | ICD-10-CM | POA: Diagnosis not present

## 2019-11-24 DIAGNOSIS — R8281 Pyuria: Secondary | ICD-10-CM | POA: Diagnosis present

## 2019-11-24 DIAGNOSIS — Z8673 Personal history of transient ischemic attack (TIA), and cerebral infarction without residual deficits: Secondary | ICD-10-CM | POA: Diagnosis not present

## 2019-11-24 DIAGNOSIS — E854 Organ-limited amyloidosis: Secondary | ICD-10-CM | POA: Diagnosis not present

## 2019-11-24 DIAGNOSIS — E785 Hyperlipidemia, unspecified: Secondary | ICD-10-CM

## 2019-11-24 DIAGNOSIS — I10 Essential (primary) hypertension: Secondary | ICD-10-CM | POA: Diagnosis not present

## 2019-11-24 DIAGNOSIS — E669 Obesity, unspecified: Secondary | ICD-10-CM | POA: Diagnosis not present

## 2019-11-24 DIAGNOSIS — I6389 Other cerebral infarction: Secondary | ICD-10-CM | POA: Diagnosis not present

## 2019-11-24 DIAGNOSIS — E1140 Type 2 diabetes mellitus with diabetic neuropathy, unspecified: Secondary | ICD-10-CM | POA: Diagnosis not present

## 2019-11-24 DIAGNOSIS — I63432 Cerebral infarction due to embolism of left posterior cerebral artery: Secondary | ICD-10-CM | POA: Diagnosis not present

## 2019-11-24 DIAGNOSIS — E1165 Type 2 diabetes mellitus with hyperglycemia: Secondary | ICD-10-CM | POA: Diagnosis not present

## 2019-11-24 DIAGNOSIS — Z794 Long term (current) use of insulin: Secondary | ICD-10-CM | POA: Diagnosis not present

## 2019-11-24 DIAGNOSIS — Z86711 Personal history of pulmonary embolism: Secondary | ICD-10-CM | POA: Diagnosis not present

## 2019-11-24 DIAGNOSIS — E039 Hypothyroidism, unspecified: Secondary | ICD-10-CM | POA: Diagnosis not present

## 2019-11-24 DIAGNOSIS — I6349 Cerebral infarction due to embolism of other cerebral artery: Secondary | ICD-10-CM | POA: Diagnosis not present

## 2019-11-24 DIAGNOSIS — Z7984 Long term (current) use of oral hypoglycemic drugs: Secondary | ICD-10-CM | POA: Diagnosis not present

## 2019-11-24 DIAGNOSIS — I63412 Cerebral infarction due to embolism of left middle cerebral artery: Secondary | ICD-10-CM | POA: Diagnosis not present

## 2019-11-24 LAB — CBG MONITORING, ED
Glucose-Capillary: 224 mg/dL — ABNORMAL HIGH (ref 70–99)
Glucose-Capillary: 175 mg/dL — ABNORMAL HIGH (ref 70–99)

## 2019-11-24 LAB — URINALYSIS, ROUTINE W REFLEX MICROSCOPIC
Bilirubin Urine: NEGATIVE
Glucose, UA: >=500 mg/dL — AB
Hgb urine dipstick: NEGATIVE
Ketones, ur: NEGATIVE mg/dL
Nitrite: NEGATIVE
Protein, ur: NEGATIVE mg/dL
Specific Gravity, Urine: 1.014 (ref 1.005–1.030)
pH: 5 (ref 5.0–8.0)

## 2019-11-24 LAB — RESPIRATORY PANEL BY RT PCR (FLU A&B, COVID)
Influenza A by PCR: NEGATIVE
Influenza B by PCR: NEGATIVE
SARS Coronavirus 2 by RT PCR: NEGATIVE

## 2019-11-24 LAB — ECHOCARDIOGRAM COMPLETE
AR max vel: 2.11 cm2
AV Area VTI: 1.92 cm2
AV Area mean vel: 1.73 cm2
AV Mean grad: 10 mmHg
AV Peak grad: 18.4 mmHg
Ao pk vel: 2.15 m/s
Area-P 1/2: 2.99 cm2
Height: 63 in
S' Lateral: 2.5 cm
Weight: 2832 oz

## 2019-11-24 LAB — RAPID URINE DRUG SCREEN, HOSP PERFORMED
Amphetamines: NOT DETECTED
Barbiturates: NOT DETECTED
Benzodiazepines: NOT DETECTED
Cocaine: NOT DETECTED
Opiates: NOT DETECTED
Tetrahydrocannabinol: NOT DETECTED

## 2019-11-24 LAB — CREATININE, SERUM
Creatinine, Ser: 1.09 mg/dL — ABNORMAL HIGH (ref 0.44–1.00)
GFR, Estimated: 47 mL/min — ABNORMAL LOW (ref >60)

## 2019-11-24 LAB — LIPID PANEL
Cholesterol: 212 mg/dL — ABNORMAL HIGH (ref 0–200)
HDL: 58 mg/dL (ref >40)
LDL Cholesterol: 128 mg/dL — ABNORMAL HIGH (ref 0–99)
Total CHOL/HDL Ratio: 3.7 RATIO
Triglycerides: 130 mg/dL (ref <150)
VLDL: 26 mg/dL (ref 0–40)

## 2019-11-24 MED ORDER — LEVOTHYROXINE SODIUM 100 MCG PO TABS
100.0000 ug | ORAL_TABLET | Freq: Every day | ORAL | Status: DC
  Administered 2019-11-24 – 2019-11-26 (×3): 100 ug via ORAL
  Filled 2019-11-24 (×3): qty 1

## 2019-11-24 MED ORDER — PROPRANOLOL HCL 40 MG PO TABS
40.0000 mg | ORAL_TABLET | Freq: Every day | ORAL | Status: DC
  Administered 2019-11-24 – 2019-11-26 (×3): 40 mg via ORAL
  Filled 2019-11-24: qty 2
  Filled 2019-11-24 (×2): qty 1

## 2019-11-24 MED ORDER — DULOXETINE HCL 30 MG PO CPEP
30.0000 mg | ORAL_CAPSULE | Freq: Every day | ORAL | Status: DC
  Administered 2019-11-24 – 2019-11-26 (×3): 30 mg via ORAL
  Filled 2019-11-24 (×3): qty 1

## 2019-11-24 MED ORDER — INSULIN ASPART 100 UNIT/ML ~~LOC~~ SOLN
0.0000 [IU] | Freq: Three times a day (TID) | SUBCUTANEOUS | Status: DC
  Administered 2019-11-26: 1 [IU] via SUBCUTANEOUS
  Filled 2019-11-24: qty 0.09

## 2019-11-24 MED ORDER — TRAZODONE HCL 50 MG PO TABS
50.0000 mg | ORAL_TABLET | Freq: Every day | ORAL | Status: DC
  Administered 2019-11-24 – 2019-11-25 (×2): 50 mg via ORAL
  Filled 2019-11-24 (×2): qty 1

## 2019-11-24 MED ORDER — GADOBUTROL 1 MMOL/ML IV SOLN
10.0000 mL | Freq: Once | INTRAVENOUS | Status: AC | PRN
  Administered 2019-11-24: 8 mL via INTRAVENOUS

## 2019-11-24 MED ORDER — INSULIN GLARGINE 100 UNIT/ML ~~LOC~~ SOLN
10.0000 [IU] | Freq: Every day | SUBCUTANEOUS | Status: DC
  Administered 2019-11-24 – 2019-11-25 (×2): 10 [IU] via SUBCUTANEOUS
  Filled 2019-11-24 (×4): qty 0.1

## 2019-11-24 MED ORDER — CLOPIDOGREL BISULFATE 75 MG PO TABS
75.0000 mg | ORAL_TABLET | Freq: Every day | ORAL | Status: DC
  Administered 2019-11-25: 75 mg via ORAL
  Filled 2019-11-24 (×2): qty 1

## 2019-11-24 MED ORDER — SODIUM CHLORIDE 0.9 % IV SOLN: INTRAVENOUS | Status: AC

## 2019-11-24 MED ORDER — ENOXAPARIN SODIUM 40 MG/0.4ML ~~LOC~~ SOLN
40.0000 mg | SUBCUTANEOUS | Status: DC
  Administered 2019-11-24 – 2019-11-25 (×2): 40 mg via SUBCUTANEOUS
  Filled 2019-11-24 (×2): qty 0.4

## 2019-11-24 MED ORDER — STROKE: EARLY STAGES OF RECOVERY BOOK
Freq: Once | Status: DC
  Filled 2019-11-24: qty 1

## 2019-11-24 MED ORDER — ACETAMINOPHEN 325 MG PO TABS: 650.0000 mg | ORAL_TABLET | ORAL | Status: DC | PRN

## 2019-11-24 MED ORDER — ACETAMINOPHEN 160 MG/5ML PO SOLN: 650.0000 mg | ORAL | Status: DC | PRN

## 2019-11-24 MED ORDER — ACETAMINOPHEN 650 MG RE SUPP: 650.0000 mg | RECTAL | Status: DC | PRN

## 2019-11-24 MED ORDER — INSULIN ASPART 100 UNIT/ML ~~LOC~~ SOLN
3.0000 [IU] | Freq: Three times a day (TID) | SUBCUTANEOUS | Status: DC
  Administered 2019-11-26: 3 [IU] via SUBCUTANEOUS
  Filled 2019-11-24: qty 0.03

## 2019-11-24 NOTE — ED Notes (Signed)
Pt ambulatory to bathroom with steady gait. Clear speech, aox4. NAD

## 2019-11-24 NOTE — H&P (Signed)
History and Physical        Hospital Admission Note Date: 11/24/2019  Patient name: Hannah Fischer Medical record number: [id number] Date of birth: April 25, 1932 Age: 86 y.o. Gender: female  PCP: Sigmund Hazel, MD  Patient coming from: Home Lives with: Husband At baseline, ambulates: Librarian, academic Complaint    Chief Complaint  Patient presents with  . Stroke Symptoms      HPI:   This is a very pleasant 86 year old female with a history of prior CVA without residual deficit, TIA, DM, PE, anemia, anxiety, hypertension, hyperlipidemia who presented to the ED on 10/19 with concern for strokelike symptoms of LUE paresthesias and weakness since 10 am on 10/18 as well as LLE heaviness and tongue numbness.  States her symptoms began while she was laying in bed.  States that she continues to have similar symptoms without much improvement.   She was recently seen for dizziness by her neurologist, Dr.Yan, on 9/21 who continued the patient's plavix due to history of strokes seen on MRI and risk factors.   ED Course: afebrile, hypertensive on room air. Notable labs: Glucose 328, BUN 25, Cr 1.29 (baseline 0.9), otherwise with pyuria and rare bacteria on UA.  CT head with chronic left parietooccipital infarct. ED PA discussed with neurology who recommended an MRI/MRA which were most notable for: small acute to subacute infarcts involving the right frontal lobe, left parietal lobe and right occipital lobe. The ED PA reached out to neurology regarding the results but has not heard back as of yet.   Vitals:   11/24/19 0734 11/24/19 0904  BP: (!) 149/73 (!) 171/85  Pulse: 79 88  Resp: 17 17  Temp:    SpO2: 99% 97%     Review of Systems:  Review of Systems  All other systems reviewed and are negative.   Medical/Social/Family History   Past Medical History: Past Medical History:  Diagnosis  Date  . Abnormal nuclear stress test    2005, normal cath Dr. Elease Hashimoto, normal stress test 2012 Dr. Eldridge Dace  . Anemia    chronic  . Anxiety   . Arthritis    djd  . Arthritis of shoulder region, left, degenerative 12/03/2013  . Blood transfusion 1979   for anemia  . Chest pain    evaluated @ [facility] cardiology; had a stress test 2012  . Chronic pain syndrome 04/29/2019  . Chronic right-sided low back pain without sciatica 04/12/2019  . Complication of anesthesia    unable to breathe lying  flat on back  . Diabetes mellitus    type 2  niddm x 25 yrs  . Diabetes mellitus (HCC) 11/15/2011  . Diverticular disease   . DJD (degenerative joint disease)    lumbar and cervical  . GERD (gastroesophageal reflux disease)   . Hyperlipemia   . Hypertension    on medication  . Hypothyroidism    takes levoxyl  . Irritable bowel syndrome   . 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 Neuromuscular disorder (HCC)   . Neuropathy in diabetes (HCC)   . Nonspecific abnormal electrocardiogram (ECG) (EKG) 10/15/2013  . Other and unspecified angina pectoris    tests  came back NOT heart related  . Pulmonary embolism (HCC) 06-2010   bilateral  . Pyuria 11/15/2011  . Seizures (HCC)    once, age 87  . TIA (transient ischemic attack) 11/15/2011    Past Surgical History:  Procedure Laterality Date  . BACK SURGERY    . BLEPHAROPLASTY  2012   rt eye  . CARDIAC CATHETERIZATION     had one 10/2013  . CHOLECYSTECTOMY    . EYE SURGERY  2007   cataract ext/ iol rt eye  . JOINT REPLACEMENT  2001,2005   both knees  . LEFT HEART CATHETERIZATION WITH CORONARY ANGIOGRAM N/A 10/18/2013   Procedure: LEFT HEART CATHETERIZATION WITH CORONARY ANGIOGRAM;  Surgeon: Corky CraftsJayadeep S Varanasi, MD;  [facility];  Service: Cardiovascular;  Laterality: N/A;  . LUMBAR DISC SURGERY  1990  . REVERSE SHOULDER ARTHROPLASTY Left 12/03/2013   Procedure: LEFT SHOULDER REVERSE ARTHROPLASTY;  Surgeon: Verlee RossettiSteven R Norris,  MD;  [facility];  Service: Orthopedics;  Laterality: Left;  . SHOULDER HEMI-ARTHROPLASTY  12/25/2010   Procedure: SHOULDER HEMI-ARTHROPLASTY;  Surgeon: Cammy CopaGregory Scott Dean;  [facility];  Service: Orthopedics;  Laterality: Right;    Medications: Prior to Admission medications   Medication Sig Start Date End Date Taking? Authorizing Provider  ALPRAZolam Prudy Feeler(XANAX) 0.25 MG tablet Take 0.25 mg by mouth at bedtime as needed for sleep. 07/02/17  Yes [provider]  Cholecalciferol (VITAMIN D3) 2000 UNITS TABS Take 2,000 Units by mouth daily.    Yes [provider]  clopidogrel (PLAVIX) 75 MG tablet Take 1 tablet (75 mg total) by mouth daily with breakfast. 11/17/11  Yes Vassie LollMadera, Carlos, MD  Cyanocobalamin (VITAMIN B-12 PO) Take 1 tablet by mouth daily.    Yes [provider]  DULoxetine (CYMBALTA) 30 MG capsule TAKE 1 CAPSULE(30 MG) BY MOUTH DAILY Patient taking differently: Take 30 mg by mouth daily.  08/25/19  Yes Patel, Maryln GottronAnkit Anil, MD  levothyroxine (SYNTHROID, LEVOTHROID) 100 MCG tablet Take 1 tablet (100 mcg total) by mouth daily. 11/17/11  Yes Vassie LollMadera, Carlos, MD  losartan (COZAAR) 100 MG tablet Take 100 mg by mouth daily. 04/29/19  Yes [provider]  Omega-3 Fatty Acids (FISH OIL) 1000 MG CAPS Take 1 capsule by mouth 2 (two) times daily.    Yes [provider]  pioglitazone (ACTOS) 45 MG tablet Take 45 mg by mouth daily.     Yes [provider]  propranolol (INDERAL) 40 MG tablet Take 40 mg by mouth daily.  04/13/17  Yes [provider]  traZODone (DESYREL) 50 MG tablet TAKE 1 TABLET BY MOUTH AT BEDTIME Patient taking differently: Take 50 mg by mouth at bedtime.  07/01/17  Yes Marcello FennelPatel, Ankit Anil, MD  cephALEXin (KEFLEX) 500 MG capsule Take 1 capsule (500 mg total) by mouth 2 (two) times daily. Patient not taking: Reported on 11/24/2019 05/17/17   Molpus, Jonny RuizJohn, MD  methocarbamol (ROBAXIN) 500 MG tablet Take 1 tablet (500 mg total)  by mouth every 8 (eight) hours as needed for muscle spasms. Patient not taking: Reported on 11/24/2019 04/13/19   Marcello FennelPatel, Ankit Anil, MD    Allergies:   Allergies  Allergen Reactions  . Codeine Anxiety  . Sulfites Other (See Comments)    Abdominal pain  . Zocor [Simvastatin] Other (See Comments)    Muscle weakness    Social History:  reports that she has never smoked. She has never used smokeless tobacco. She reports that she does not drink alcohol and does not  use drugs.  Family History: Family History  Problem Relation Age of Onset  . Diabetes Mother   . Heart disease Mother   . Heart disease Sister   . Heart disease Brother      Objective   Physical Exam: Blood pressure (!) 171/85, pulse 88, temperature 98.2 F (36.8 C), temperature source Oral, resp. rate 17, height 5\' 3"  (1.6 m), weight 80.3 kg, SpO2 97 %.  Physical Exam Vitals and nursing note reviewed.  Constitutional:      Appearance: Normal appearance.  HENT:     Head: Normocephalic and atraumatic.  Eyes:     Conjunctiva/sclera: Conjunctivae normal.  Cardiovascular:     Rate and Rhythm: Normal rate and regular rhythm.  Pulmonary:     Effort: Pulmonary effort is normal.     Breath sounds: Normal breath sounds.  Abdominal:     General: Abdomen is flat.     Palpations: Abdomen is soft.  Musculoskeletal:        General: No swelling or tenderness.  Skin:    Coloration: Skin is not jaundiced or pale.  Neurological:     Mental Status: She is alert.     Cranial Nerves: No dysarthria or facial asymmetry.     Motor: No weakness.     Coordination: Finger-Nose-Finger Test abnormal.     Comments: Left hand abnormal finger-to-nose, normal on right  Psychiatric:        Mood and Affect: Mood normal.        Behavior: Behavior normal.     LABS on Admission: I have personally reviewed all the labs and imaging below    Basic Metabolic Panel: Recent Labs  Lab 11/23/19 2304 11/23/19 2329  NA 137 140  K 4.2  4.0  CL 100 99  CO2 27  --   GLUCOSE 328* 326*  BUN 25* 25*  CREATININE 1.29* 1.30*  CALCIUM 8.9  --    Liver Function Tests: Recent Labs  Lab 11/23/19 2304  AST 21  ALT 9  ALKPHOS 73  BILITOT 0.9  PROT 6.8  ALBUMIN 3.7   No results for input(s): LIPASE, AMYLASE in the last 168 hours. No results for input(s): AMMONIA in the last 168 hours. CBC: Recent Labs  Lab 11/23/19 2304 11/23/19 2329  WBC 4.0  --   NEUTROABS 2.2  --   HGB 12.6 12.9  HCT 38.8 38.0  MCV 99.2  --   PLT 219  --    Cardiac Enzymes: No results for input(s): CKTOTAL, CKMB, CKMBINDEX, TROPONINI in the last 168 hours. BNP: Invalid input(s): POCBNP CBG: No results for input(s): GLUCAP in the last 168 hours.  Radiological Exams on Admission:  CT Head Wo Contrast  Result Date: 11/23/2019 CLINICAL DATA:  Left-sided facial droop. EXAM: CT HEAD WITHOUT CONTRAST TECHNIQUE: Contiguous axial images were obtained from the base of the skull through the vertex without intravenous contrast. COMPARISON:  November 15, 2011 FINDINGS: Brain: There is mild cerebral atrophy with widening of the extra-axial spaces and ventricular dilatation. There are areas of decreased attenuation within the white matter tracts of the supratentorial brain, consistent with microvascular disease changes. A small area of cortical encephalomalacia, with adjacent chronic white matter low attenuation, is seen within the left parietooccipital region. Vascular: No hyperdense vessel or unexpected calcification. Skull: Normal. Negative for fracture or focal lesion. Sinuses/Orbits: No acute finding. Other: None. IMPRESSION: 1. Generalized cerebral atrophy. 2. Small, chronic left parietooccipital infarct. 3. No acute intracranial abnormality. Electronically Signed   By:  Aram Candela M.D.   On: 11/23/2019 23:24   MR ANGIO HEAD WO CONTRAST  Result Date: 11/24/2019 CLINICAL DATA:  Left facial droop EXAM: MRI HEAD WITHOUT CONTRAST MRA HEAD WITHOUT  CONTRAST MRA NECK WITHOUT AND WITH CONTRAST TECHNIQUE: Multiplanar, multiecho pulse sequences of the brain and surrounding structures were obtained without intravenous contrast. Angiographic images of the Circle of Willis were obtained using MRA technique without intravenous contrast. Angiographic images of the neck were obtained using MRA technique without and with intravenous contrast. Carotid stenosis measurements (when applicable) are obtained utilizing NASCET criteria, using the distal internal carotid diameter as the denominator. CONTRAST:  95mL GADAVIST GADOBUTROL 1 MMOL/ML IV SOLN COMPARISON:  MRI brain 07/20/2019 FINDINGS: MRI HEAD Brain: There is a small focus diffusion hyperintensity and ADC isointensity involving the right precentral gyrus hand motor region. Additional small focus of reduced diffusion in the parasagittal inferior left parietal lobe. Possible additional small focus of reduced diffusion in the right occipital lobe. Chronic left parietooccipital infarct again seen with associated chronic blood products. Right anterior temporal encephalomalacia is also again identified. Additional patchy and confluent areas of T2 hyperintensity in the supratentorial white matter nonspecific but probably reflect stable chronic microvascular ischemic changes. Prominence of the ventricles and sulci reflects stable volume loss. There is evidence of prior subarachnoid hemorrhage on susceptibility weighted imaging. Numerous small foci of susceptibility are present particularly involving the posterior cerebrum likely reflecting chronic microhemorrhages. There is no intracranial mass or mass effect. Vascular: Major vessel flow voids at the skull base are preserved. Skull and upper cervical spine: Normal marrow signal is preserved. Sinuses/Orbits: Mild mucosal thickening. Bilateral lens replacements. Other: Sella is unremarkable.  Mastoid air cells are clear. MRA HEAD Intracranial internal carotid arteries are patent  with atherosclerotic irregularity left greater than right. Anterior cerebral arteries are patent. Right A1 ACA is dominant. 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 Middle cerebral arteries are patent. Intracranial left vertebral artery is patent. The right vertebral artery is patent on postcontrast MRA neck. May terminate as a PICA. Basilar artery is patent. Posterior cerebral arteries are patent with atherosclerotic irregularity there is no anteriorly directed aneurysm left posteriorly from the anterior genu posterior. MRA NECK Common, internal, and external carotid arteries are patent. There is no hemodynamically significant stenosis at the ICA origins. Extracranial vertebral arteries are patent. Left vertebral artery is dominant. Possible stenosis at the right vertebral origin, which is not well evaluated due to artifact. IMPRESSION: Small acute to subacute infarcts involving right frontal lobe (precentral gyrus), left parietal lobe, and right occipital lobe. Stable chronic findings including chronic infarcts, chronic microvascular ischemic changes, and chronic microhemorrhages and subarachnoid blood products in a pattern suggesting amyloid angiopathy. No large vessel occlusion. No hemodynamically significant stenosis at the ICA origins. Electronically Signed   By: Guadlupe Spanish M.D.   On: 11/24/2019 07:55   MR ANGIO NECK W WO CONTRAST  Result Date: 11/24/2019 CLINICAL DATA:  Left facial droop EXAM: MRI HEAD WITHOUT CONTRAST MRA HEAD WITHOUT CONTRAST MRA NECK WITHOUT AND WITH CONTRAST TECHNIQUE: Multiplanar, multiecho pulse sequences of the brain and surrounding structures were obtained without intravenous contrast. Angiographic images of the Circle of Willis were obtained using MRA technique without intravenous contrast. Angiographic images of the neck were obtained using MRA technique without and with intravenous contrast. Carotid stenosis measurements (when applicable) are obtained utilizing NASCET  criteria, using the distal internal carotid diameter as the denominator. CONTRAST:  24mL GADAVIST GADOBUTROL 1 MMOL/ML IV SOLN COMPARISON:  MRI  brain 07/20/2019 FINDINGS: MRI HEAD Brain: There is a small focus diffusion hyperintensity and ADC isointensity involving the right precentral gyrus hand motor region. Additional small focus of reduced diffusion in the parasagittal inferior left parietal lobe. Possible additional small focus of reduced diffusion in the right occipital lobe. Chronic left parietooccipital infarct again seen with associated chronic blood products. Right anterior temporal encephalomalacia is also again identified. Additional patchy and confluent areas of T2 hyperintensity in the supratentorial white matter nonspecific but probably reflect stable chronic microvascular ischemic changes. Prominence of the ventricles and sulci reflects stable volume loss. There is evidence of prior subarachnoid hemorrhage on susceptibility weighted imaging. Numerous small foci of susceptibility are present particularly involving the posterior cerebrum likely reflecting chronic microhemorrhages. There is no intracranial mass or mass effect. Vascular: Major vessel flow voids at the skull base are preserved. Skull and upper cervical spine: Normal marrow signal is preserved. Sinuses/Orbits: Mild mucosal thickening. Bilateral lens replacements. Other: Sella is unremarkable.  Mastoid air cells are clear. MRA HEAD Intracranial internal carotid arteries are patent with atherosclerotic irregularity left greater than right. Anterior cerebral arteries are patent. Right A1 ACA is dominant. 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 Middle cerebral arteries are patent. Intracranial left vertebral artery is patent. The right vertebral artery is patent on postcontrast MRA neck. May terminate as a PICA. Basilar artery is patent. Posterior cerebral arteries are patent with atherosclerotic irregularity there is no anteriorly directed  aneurysm left posteriorly from the anterior genu posterior. MRA NECK Common, internal, and external carotid arteries are patent. There is no hemodynamically significant stenosis at the ICA origins. Extracranial vertebral arteries are patent. Left vertebral artery is dominant. Possible stenosis at the right vertebral origin, which is not well evaluated due to artifact. IMPRESSION: Small acute to subacute infarcts involving right frontal lobe (precentral gyrus), left parietal lobe, and right occipital lobe. Stable chronic findings including chronic infarcts, chronic microvascular ischemic changes, and chronic microhemorrhages and subarachnoid blood products in a pattern suggesting amyloid angiopathy. No large vessel occlusion. No hemodynamically significant stenosis at the ICA origins. Electronically Signed   By: Guadlupe Spanish M.D.   On: 11/24/2019 07:55   MR BRAIN WO CONTRAST  Result Date: 11/24/2019 CLINICAL DATA:  Left facial droop EXAM: MRI HEAD WITHOUT CONTRAST MRA HEAD WITHOUT CONTRAST MRA NECK WITHOUT AND WITH CONTRAST TECHNIQUE: Multiplanar, multiecho pulse sequences of the brain and surrounding structures were obtained without intravenous contrast. Angiographic images of the Circle of Willis were obtained using MRA technique without intravenous contrast. Angiographic images of the neck were obtained using MRA technique without and with intravenous contrast. Carotid stenosis measurements (when applicable) are obtained utilizing NASCET criteria, using the distal internal carotid diameter as the denominator. CONTRAST:  8mL GADAVIST GADOBUTROL 1 MMOL/ML IV SOLN COMPARISON:  MRI brain 07/20/2019 FINDINGS: MRI HEAD Brain: There is a small focus diffusion hyperintensity and ADC isointensity involving the right precentral gyrus hand motor region. Additional small focus of reduced diffusion in the parasagittal inferior left parietal lobe. Possible additional small focus of reduced diffusion in the right  occipital lobe. Chronic left parietooccipital infarct again seen with associated chronic blood products. Right anterior temporal encephalomalacia is also again identified. Additional patchy and confluent areas of T2 hyperintensity in the supratentorial white matter nonspecific but probably reflect stable chronic microvascular ischemic changes. Prominence of the ventricles and sulci reflects stable volume loss. There is evidence of prior subarachnoid hemorrhage on susceptibility weighted imaging. Numerous small foci of susceptibility are present particularly involving the  posterior cerebrum likely reflecting chronic microhemorrhages. There is no intracranial mass or mass effect. Vascular: Major vessel flow voids at the skull base are preserved. Skull and upper cervical spine: Normal marrow signal is preserved. Sinuses/Orbits: Mild mucosal thickening. Bilateral lens replacements. Other: Sella is unremarkable.  Mastoid air cells are clear. MRA HEAD Intracranial internal carotid arteries are patent with atherosclerotic irregularity left greater than right. Anterior cerebral arteries are patent. Right A1 ACA is dominant. 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 Middle cerebral arteries are patent. Intracranial left vertebral artery is patent. The right vertebral artery is patent on postcontrast MRA neck. May terminate as a PICA. Basilar artery is patent. Posterior cerebral arteries are patent with atherosclerotic irregularity there is no anteriorly directed aneurysm left posteriorly from the anterior genu posterior. MRA NECK Common, internal, and external carotid arteries are patent. There is no hemodynamically significant stenosis at the ICA origins. Extracranial vertebral arteries are patent. Left vertebral artery is dominant. Possible stenosis at the right vertebral origin, which is not well evaluated due to artifact. IMPRESSION: Small acute to subacute infarcts involving right frontal lobe (precentral gyrus), left  parietal lobe, and right occipital lobe. Stable chronic findings including chronic infarcts, chronic microvascular ischemic changes, and chronic microhemorrhages and subarachnoid blood products in a pattern suggesting amyloid angiopathy. No large vessel occlusion. No hemodynamically significant stenosis at the ICA origins. Electronically Signed   By: Guadlupe Spanish M.D.   On: 11/24/2019 07:55      EKG: Independently reviewed.    A & P   Principal Problem:   Stroke [facility]) Active Problems:   HTN (hypertension)   Diabetes mellitus (HCC)   Dyslipidemia   AKI (acute kidney injury) (HCC)   1. Acute to subacute right frontal lobe (precentral gyrus), left parietal lobe and right occipital lobe infarcts stable a. History of chronic microvascular ischemic changes, microhemorrhages and subarachnoid blood products suggesting amyloid angiopathy on MRI b. Did not receive TPA as she was out of the window c. Currently on Plavix, not on a statin due to history of muscle weakness with statin use  d. Continue Plavix e. Echo f. Lipid panel g. HbA1C h. Neurology consulted i. Transfer to [facility] j. PT eval  k. Telemetry  2. AKI a. Give IV fluids b. Hold losartan  3. Hypertension, stable a. Hold losartan   4. Hyperlipidemia a. Not on any lipid lowering therapy b. Has a history of muscle weakness with statins c. Lipid panel pending  5. Type 2 diabetes with hyperglycemia a. Hb A1c b. Lantus 10 u daily with Novolog 3 u TID with meals and sliding scale   DVT prophylaxis: Lovenox   Code Status: Prior  Diet: heart healthy/carb modified Family Communication: Admission, patients condition and plan of care including tests being ordered have been discussed with the patient who indicates understanding and agrees with the plan and Code Status.  Disposition Plan: The appropriate patient status for this patient is OBSERVATION. Observation status is judged to be reasonable and necessary in order to provide  the required intensity of service to ensure the patient's safety. The patient's presenting symptoms, physical exam findings, and initial radiographic and laboratory data in the context of their medical condition is felt to place them at decreased risk for further clinical deterioration. Furthermore, it is anticipated that the patient will be medically stable for discharge from the hospital within 2 midnights of admission. The following factors support the patient status of observation.   " The patient's presenting symptoms include left  hand weakness and paresthesias and oral numbness. " The physical exam findings include left hand dysmetria. " The initial radiographic and laboratory data are stroke as noted above.      Consultants  . neurology  Procedures  . none  Time Spent on Admission: 66 minutes    Jae Dire, DO [facility]  11/24/2019, 9:33 AM

## 2019-11-24 NOTE — Evaluation (Signed)
SLP Cancellation Note  Patient Details Name: Hannah Fischer MRN: [id number] DOB: January 11, 1933   Cancelled treatment:       Reason Eval/Treat Not Completed: Other (comment) (per chart, [facility] to [facility] for [facility] work up, SLP will follow up at [facility])  Rolena Infante, MS [facility] SLP Acute Rehab Services Office [phone] Pager [phone]    Chales Abrahams 11/24/2019, 10:43 AM

## 2019-11-24 NOTE — Progress Notes (Signed)
  Echocardiogram 2D Echocardiogram has been performed.  Tye Savoy 11/24/2019, 11:41 AM

## 2019-11-24 NOTE — ED Provider Notes (Signed)
Hannah Fischer is a 86 y.o. female, presenting to the ED with left-sided symptoms with concern for possible stroke.    HPI from Harvie Heck, PA-C: "Hannah Fischer is a 86 y.o. female with a history of prior CVA & TIA, DM, prior PE, anemia, anxiety, hypertension, & hyperlipidemia who presents to the emergency department with concern for possible stroke symptoms that began yesterday at 10 AM.  Patient states that since yesterday morning her left upper extremity has had paresthesias as well as weakness which she further describes as not being able to make it do what she wants to, left lower extremity heaviness, as well as an abnormal sensation to the lower portion of her face right greater than left.  There are no specific alleviating or aggravating factors to her symptoms.  No intervention prior to arrival.  Her symptoms seemed a bit better this evening but then worsened again, she never had complete resolution.  The left hand feels a bit sore.  She denies any recent injuries.  Denies visual disturbance, dizziness like the room spinning, slurred speech, syncope, chest pain, shortness of breath, or fevers.  She has had prior stroke in the past, however she does not think she has any residual deficits from this."  Past Medical History:  Diagnosis Date  . Abnormal nuclear stress test    2005, normal cath Dr. Elease Hashimoto, normal stress test 2012 Dr. Eldridge Dace  . Anemia    chronic  . Anxiety   . Arthritis    djd  . Arthritis of shoulder region, left, degenerative 12/03/2013  . Blood transfusion 1979   for anemia  . Chest pain    evaluated @ [facility] cardiology; had a stress test 2012  . Chronic pain syndrome 04/29/2019  . Chronic right-sided low back pain without sciatica 04/12/2019  . Complication of anesthesia    unable to breathe lying  flat on back  . Diabetes mellitus    type 2  niddm x 25 yrs  . Diabetes mellitus (HCC) 11/15/2011  . Diverticular disease   . DJD (degenerative joint disease)     lumbar and cervical  . GERD (gastroesophageal reflux disease)   . Hyperlipemia   . Hypertension    on medication  . Hypothyroidism    takes levoxyl  . Irritable bowel syndrome   . 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 Neuromuscular disorder (HCC)   . Neuropathy in diabetes (HCC)   . Nonspecific abnormal electrocardiogram (ECG) (EKG) 10/15/2013  . Other and unspecified angina pectoris    tests came back NOT heart related  . Pulmonary embolism (HCC) 06-2010   bilateral  . Pyuria 11/15/2011  . Seizures (HCC)    once, age 69  . TIA (transient ischemic attack) 11/15/2011    Physical Exam  BP (!) 162/75   Pulse 69   Temp 98.2 F (36.8 C) (Oral)   Resp 16   Ht  (1.6 m)   Wt 80.3 kg   SpO2 99%   BMI 31.35 kg/m   Physical Exam Vitals and nursing note reviewed.  Constitutional:      General: She is not in acute distress.    Appearance: She is well-developed. She is not diaphoretic.  HENT:     Head: Normocephalic and atraumatic.     Mouth/Throat:     Mouth: Mucous membranes are moist.     Pharynx: Oropharynx is clear.  Eyes:     Conjunctiva/sclera: Conjunctivae normal.  Cardiovascular:  Rate and Rhythm: Normal rate and regular rhythm.     Pulses: Normal pulses.          Radial pulses are 2+ on the right side and 2+ on the left side.       Posterior tibial pulses are 2+ on the right side and 2+ on the left side.     Heart sounds: Normal heart sounds.     Comments: Tactile temperature in the extremities appropriate and equal bilaterally. Pulmonary:     Effort: Pulmonary effort is normal. No respiratory distress.     Breath sounds: Normal breath sounds.  Abdominal:     Palpations: Abdomen is soft.     Tenderness: There is no abdominal tenderness. There is no guarding.  Musculoskeletal:     Cervical back: Neck supple.     Right lower leg: No edema.     Left lower leg: No edema.  Lymphadenopathy:     Cervical: No cervical adenopathy.  Skin:     General: Skin is warm and dry.  Neurological:     Mental Status: She is alert and oriented to person, place, and time.     Comments: She does seem to have weaker grip on the left. No noted facial droop. No noted cognitive deficit. Handles oral secretions without noted difficulty. Strength 5/5 in the lower extremities. Possibly weaker in the left upper extremity.  Psychiatric:        Mood and Affect: Mood and affect normal.        Speech: Speech normal.        Behavior: Behavior normal.     ED Course/Procedures    .Critical Care Performed by: Anselm PancoastJoy, Terrel Nesheiwat C, PA-C Authorized by: Anselm PancoastJoy, Selim Durden C, PA-C   Critical care provider statement:    Critical care time (minutes):  35   Critical care time was exclusive of:  Separately billable procedures and treating other patients   Critical care was necessary to treat or prevent imminent or life-threatening deterioration of the following conditions:  CNS failure or compromise   Critical care was time spent personally by me on the following activities:  Discussions with consultants, development of treatment plan with patient or surrogate, re-evaluation of patient's condition, review of old charts, obtaining history from patient or surrogate and examination of patient (Review and interpretation of labs and imaging studies)   I assumed direction of critical care for this patient from another provider in my specialty: yes       Abnormal Labs Reviewed  COMPREHENSIVE METABOLIC PANEL - Abnormal; Notable for the following components:      Result Value   Glucose, Bld 328 (*)    BUN 25 (*)    Creatinine, Ser 1.29 (*)    GFR, Estimated 38 (*)    All other components within normal limits  URINALYSIS, ROUTINE W REFLEX MICROSCOPIC - Abnormal; Notable for the following components:   APPearance HAZY (*)    Glucose, UA >=500 (*)    Leukocytes,Ua MODERATE (*)    Bacteria, UA RARE (*)    Non Squamous Epithelial 0-5 (*)    All other components within normal  limits  I-STAT CHEM 8, ED - Abnormal; Notable for the following components:   BUN 25 (*)    Creatinine, Ser 1.30 (*)    Glucose, Bld 326 (*)    Calcium, Ion 1.11 (*)    All other components within normal limits    CT Head Wo Contrast  Result Date: 11/23/2019 CLINICAL DATA:  Left-sided facial droop. EXAM: CT HEAD WITHOUT CONTRAST TECHNIQUE: Contiguous axial images were obtained from the base of the skull through the vertex without intravenous contrast. COMPARISON:  November 15, 2011 FINDINGS: Brain: There is mild cerebral atrophy with widening of the extra-axial spaces and ventricular dilatation. There are areas of decreased attenuation within the white matter tracts of the supratentorial brain, consistent with microvascular disease changes. A small area of cortical encephalomalacia, with adjacent chronic white matter low attenuation, is seen within the left parietooccipital region. Vascular: No hyperdense vessel or unexpected calcification. Skull: Normal. Negative for fracture or focal lesion. Sinuses/Orbits: No acute finding. Other: None. IMPRESSION: 1. Generalized cerebral atrophy. 2. Small, chronic left parietooccipital infarct. 3. No acute intracranial abnormality. Electronically Signed   By: Aram Candela M.D.   On: 11/23/2019 23:24   MR ANGIO HEAD WO CONTRAST  Result Date: 11/24/2019 CLINICAL DATA:  Left facial droop EXAM: MRI HEAD WITHOUT CONTRAST MRA HEAD WITHOUT CONTRAST MRA NECK WITHOUT AND WITH CONTRAST TECHNIQUE: Multiplanar, multiecho pulse sequences of the brain and surrounding structures were obtained without intravenous contrast. Angiographic images of the Circle of Willis were obtained using MRA technique without intravenous contrast. Angiographic images of the neck were obtained using MRA technique without and with intravenous contrast. Carotid stenosis measurements (when applicable) are obtained utilizing NASCET criteria, using the distal internal carotid diameter as the  denominator. CONTRAST:  53mL GADAVIST GADOBUTROL 1 MMOL/ML IV SOLN COMPARISON:  MRI brain 07/20/2019 FINDINGS: MRI HEAD Brain: There is a small focus diffusion hyperintensity and ADC isointensity involving the right precentral gyrus hand motor region. Additional small focus of reduced diffusion in the parasagittal inferior left parietal lobe. Possible additional small focus of reduced diffusion in the right occipital lobe. Chronic left parietooccipital infarct again seen with associated chronic blood products. Right anterior temporal encephalomalacia is also again identified. Additional patchy and confluent areas of T2 hyperintensity in the supratentorial white matter nonspecific but probably reflect stable chronic microvascular ischemic changes. Prominence of the ventricles and sulci reflects stable volume loss. There is evidence of prior subarachnoid hemorrhage on susceptibility weighted imaging. Numerous small foci of susceptibility are present particularly involving the posterior cerebrum likely reflecting chronic microhemorrhages. There is no intracranial mass or mass effect. Vascular: Major vessel flow voids at the skull base are preserved. Skull and upper cervical spine: Normal marrow signal is preserved. Sinuses/Orbits: Mild mucosal thickening. Bilateral lens replacements. Other: Sella is unremarkable.  Mastoid air cells are clear. MRA HEAD Intracranial internal carotid arteries are patent with atherosclerotic irregularity left greater than right. Anterior cerebral arteries are patent. Right A1 ACA is dominant. 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 Middle cerebral arteries are patent. Intracranial left vertebral artery is patent. The right vertebral artery is patent on postcontrast MRA neck. May terminate as a PICA. Basilar artery is patent. Posterior cerebral arteries are patent with atherosclerotic irregularity there is no anteriorly directed aneurysm left posteriorly from the anterior genu posterior.  MRA NECK Common, internal, and external carotid arteries are patent. There is no hemodynamically significant stenosis at the ICA origins. Extracranial vertebral arteries are patent. Left vertebral artery is dominant. Possible stenosis at the right vertebral origin, which is not well evaluated due to artifact. IMPRESSION: Small acute to subacute infarcts involving right frontal lobe (precentral gyrus), left parietal lobe, and right occipital lobe. Stable chronic findings including chronic infarcts, chronic microvascular ischemic changes, and chronic microhemorrhages and subarachnoid blood products in a pattern suggesting amyloid angiopathy. No large vessel occlusion. No hemodynamically significant stenosis  at the ICA origins. Electronically Signed   By: Guadlupe Spanish M.D.   On: 11/24/2019 07:55   MR ANGIO NECK W WO CONTRAST  Result Date: 11/24/2019 CLINICAL DATA:  Left facial droop EXAM: MRI HEAD WITHOUT CONTRAST MRA HEAD WITHOUT CONTRAST MRA NECK WITHOUT AND WITH CONTRAST TECHNIQUE: Multiplanar, multiecho pulse sequences of the brain and surrounding structures were obtained without intravenous contrast. Angiographic images of the Circle of Willis were obtained using MRA technique without intravenous contrast. Angiographic images of the neck were obtained using MRA technique without and with intravenous contrast. Carotid stenosis measurements (when applicable) are obtained utilizing NASCET criteria, using the distal internal carotid diameter as the denominator. CONTRAST:  7mL GADAVIST GADOBUTROL 1 MMOL/ML IV SOLN COMPARISON:  MRI brain 07/20/2019 FINDINGS: MRI HEAD Brain: There is a small focus diffusion hyperintensity and ADC isointensity involving the right precentral gyrus hand motor region. Additional small focus of reduced diffusion in the parasagittal inferior left parietal lobe. Possible additional small focus of reduced diffusion in the right occipital lobe. Chronic left parietooccipital infarct again  seen with associated chronic blood products. Right anterior temporal encephalomalacia is also again identified. Additional patchy and confluent areas of T2 hyperintensity in the supratentorial white matter nonspecific but probably reflect stable chronic microvascular ischemic changes. Prominence of the ventricles and sulci reflects stable volume loss. There is evidence of prior subarachnoid hemorrhage on susceptibility weighted imaging. Numerous small foci of susceptibility are present particularly involving the posterior cerebrum likely reflecting chronic microhemorrhages. There is no intracranial mass or mass effect. Vascular: Major vessel flow voids at the skull base are preserved. Skull and upper cervical spine: Normal marrow signal is preserved. Sinuses/Orbits: Mild mucosal thickening. Bilateral lens replacements. Other: Sella is unremarkable.  Mastoid air cells are clear. MRA HEAD Intracranial internal carotid arteries are patent with atherosclerotic irregularity left greater than right. Anterior cerebral arteries are patent. Right A1 ACA is dominant. 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 Middle cerebral arteries are patent. Intracranial left vertebral artery is patent. The right vertebral artery is patent on postcontrast MRA neck. May terminate as a PICA. Basilar artery is patent. Posterior cerebral arteries are patent with atherosclerotic irregularity there is no anteriorly directed aneurysm left posteriorly from the anterior genu posterior. MRA NECK Common, internal, and external carotid arteries are patent. There is no hemodynamically significant stenosis at the ICA origins. Extracranial vertebral arteries are patent. Left vertebral artery is dominant. Possible stenosis at the right vertebral origin, which is not well evaluated due to artifact. IMPRESSION: Small acute to subacute infarcts involving right frontal lobe (precentral gyrus), left parietal lobe, and right occipital lobe. Stable chronic  findings including chronic infarcts, chronic microvascular ischemic changes, and chronic microhemorrhages and subarachnoid blood products in a pattern suggesting amyloid angiopathy. No large vessel occlusion. No hemodynamically significant stenosis at the ICA origins. Electronically Signed   By: Guadlupe Spanish M.D.   On: 11/24/2019 07:55   MR BRAIN WO CONTRAST  Result Date: 11/24/2019 CLINICAL DATA:  Left facial droop EXAM: MRI HEAD WITHOUT CONTRAST MRA HEAD WITHOUT CONTRAST MRA NECK WITHOUT AND WITH CONTRAST TECHNIQUE: Multiplanar, multiecho pulse sequences of the brain and surrounding structures were obtained without intravenous contrast. Angiographic images of the Circle of Willis were obtained using MRA technique without intravenous contrast. Angiographic images of the neck were obtained using MRA technique without and with intravenous contrast. Carotid stenosis measurements (when applicable) are obtained utilizing NASCET criteria, using the distal internal carotid diameter as the denominator. CONTRAST:  59mL GADAVIST GADOBUTROL  1 MMOL/ML IV SOLN COMPARISON:  MRI brain 07/20/2019 FINDINGS: MRI HEAD Brain: There is a small focus diffusion hyperintensity and ADC isointensity involving the right precentral gyrus hand motor region. Additional small focus of reduced diffusion in the parasagittal inferior left parietal lobe. Possible additional small focus of reduced diffusion in the right occipital lobe. Chronic left parietooccipital infarct again seen with associated chronic blood products. Right anterior temporal encephalomalacia is also again identified. Additional patchy and confluent areas of T2 hyperintensity in the supratentorial white matter nonspecific but probably reflect stable chronic microvascular ischemic changes. Prominence of the ventricles and sulci reflects stable volume loss. There is evidence of prior subarachnoid hemorrhage on susceptibility weighted imaging. Numerous small foci of  susceptibility are present particularly involving the posterior cerebrum likely reflecting chronic microhemorrhages. There is no intracranial mass or mass effect. Vascular: Major vessel flow voids at the skull base are preserved. Skull and upper cervical spine: Normal marrow signal is preserved. Sinuses/Orbits: Mild mucosal thickening. Bilateral lens replacements. Other: Sella is unremarkable.  Mastoid air cells are clear. MRA HEAD Intracranial internal carotid arteries are patent with atherosclerotic irregularity left greater than right. Anterior cerebral arteries are patent. Right A1 ACA is dominant. 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 Middle cerebral arteries are patent. Intracranial left vertebral artery is patent. The right vertebral artery is patent on postcontrast MRA neck. May terminate as a PICA. Basilar artery is patent. Posterior cerebral arteries are patent with atherosclerotic irregularity there is no anteriorly directed aneurysm left posteriorly from the anterior genu posterior. MRA NECK Common, internal, and external carotid arteries are patent. There is no hemodynamically significant stenosis at the ICA origins. Extracranial vertebral arteries are patent. Left vertebral artery is dominant. Possible stenosis at the right vertebral origin, which is not well evaluated due to artifact. IMPRESSION: Small acute to subacute infarcts involving right frontal lobe (precentral gyrus), left parietal lobe, and right occipital lobe. Stable chronic findings including chronic infarcts, chronic microvascular ischemic changes, and chronic microhemorrhages and subarachnoid blood products in a pattern suggesting amyloid angiopathy. No large vessel occlusion. No hemodynamically significant stenosis at the ICA origins. Electronically Signed   By: Guadlupe Spanish M.D.   On: 11/24/2019 07:55    EKG Interpretation  Date/Time:  Tuesday November 23 2019 23:21:27 EDT Ventricular Rate:  72 PR Interval:    QRS  Duration: 111 QT Interval:  428 QTC Calculation: 469 R Axis:   -17 Text Interpretation: Sinus rhythm Borderline left axis deviation Anterior infarct, old No acute changes No significant change since last tracing Confirmed by Derwood Kaplan [phone]) on 11/23/2019 11:48:51 PM        MDM   Clinical Course as of Nov 23 999  Wed Nov 24, 2019  2130 Spoke with Dr. Dairl Ponder, hospitalist. States he will work on her admission after we have updated neurology.   [SJ]  [id number] Spoke with Dr. Jerrell Belfast, neurologist. Advises to have patient admitted to [facility]. Hospitalist updated.   [SJ]    Clinical Course User Index [SJ] Anselm Pancoast, PA-C      Patient care handoff report received from [facility], PA-C. Plan: Patient awaiting MRI due to concern for stroke.   Patient has evidence of acute to subacute infarcts noted on MRI.  We will admit to hospital.  Findings and plan of care discussed with Carmell Austria, MD.   Vitals:   11/24/19 0400 11/24/19 0430 11/24/19 0500 11/24/19 0530  BP: 90/79 128/65 (!) 143/88 (!) 162/75  Pulse: 81 71 67  69  Resp: 16 16 19 16   Temp:      TempSrc:      SpO2: 95% 95% 99% 99%  Weight:      Height:           , PA-C 11/24/19 1003    11/26/19, MD 11/24/19 1516

## 2019-11-25 DIAGNOSIS — N179 Acute kidney failure, unspecified: Secondary | ICD-10-CM

## 2019-11-25 DIAGNOSIS — I6349 Cerebral infarction due to embolism of other cerebral artery: Secondary | ICD-10-CM

## 2019-11-25 DIAGNOSIS — I6340 Cerebral infarction due to embolism of unspecified cerebral artery: Secondary | ICD-10-CM

## 2019-11-25 DIAGNOSIS — E1149 Type 2 diabetes mellitus with other diabetic neurological complication: Secondary | ICD-10-CM

## 2019-11-25 DIAGNOSIS — I680 Cerebral amyloid angiopathy: Secondary | ICD-10-CM

## 2019-11-25 LAB — GLUCOSE, CAPILLARY
Glucose-Capillary: 141 mg/dL — ABNORMAL HIGH (ref 70–99)
Glucose-Capillary: 112 mg/dL — ABNORMAL HIGH (ref 70–99)
Glucose-Capillary: 219 mg/dL — ABNORMAL HIGH (ref 70–99)

## 2019-11-25 LAB — HEMOGLOBIN A1C
Hgb A1c MFr Bld: 7.5 % — ABNORMAL HIGH (ref 4.8–5.6)
Mean Plasma Glucose: 169 mg/dL

## 2019-11-25 MED ORDER — LOSARTAN POTASSIUM 50 MG PO TABS
100.0000 mg | ORAL_TABLET | Freq: Every day | ORAL | Status: DC
  Administered 2019-11-25 – 2019-11-26 (×2): 100 mg via ORAL
  Filled 2019-11-25 (×2): qty 2

## 2019-11-25 MED ORDER — CLOPIDOGREL BISULFATE 75 MG PO TABS
75.0000 mg | ORAL_TABLET | Freq: Every day | ORAL | Status: DC
  Administered 2019-11-26: 75 mg via ORAL
  Filled 2019-11-25: qty 1

## 2019-11-25 MED ORDER — PRAVASTATIN SODIUM 40 MG PO TABS
40.0000 mg | ORAL_TABLET | Freq: Every day | ORAL | Status: DC
  Administered 2019-11-25: 40 mg via ORAL
  Filled 2019-11-25: qty 1

## 2019-11-25 NOTE — TOC Initial Note (Signed)
Transition of Care [facility]) - Initial/Assessment Note    Patient Details  Name: Hannah Fischer MRN: [id number] Date of Birth: [birth year]  [facility] CM/SW Contact:    Kermit Balo, RN Phone Number: 11/25/2019, 1:40 PM  Clinical Narrative:                 Pt lives with spouse that can provide supervision.  Recommendations are for outpatient therapy and pt is agreeable to [facility] rehab. Orders in Epic and information on the AVS. Pt denies issues with transportation or home medications. TOC following for further d/c needs.   Expected Discharge Plan: OP Rehab Barriers to Discharge: Continued Medical Work up   Patient Goals and CMS Choice     Choice offered to / list presented to : Patient, Spouse  Expected Discharge Plan and Services Expected Discharge Plan: OP Rehab   Discharge Planning Services: CM Consult   Living arrangements for the past 2 months: Single Family Home                                      Prior Living Arrangements/Services Living arrangements for the past 2 months: Single Family Home Lives with:: Spouse Patient language and need for interpreter reviewed:: Yes Do you feel safe going back to the place where you live?: Yes      Need for Family Participation in Patient Care: Yes (Comment) Care giver support system in place?: Yes (comment)   Criminal Activity/Legal Involvement Pertinent to Current Situation/Hospitalization: No - Comment as needed  Activities of Daily Living Home Assistive Devices/Equipment: Cane (specify quad or straight), Eyeglasses, Other (Comment), Hand-held shower hose, Dentures (specify type) (single point cane, TENs unit, upper/lower dentures, handicap toilet) ADL Screening (condition at time of admission) Patient's cognitive ability adequate to safely complete daily activities?: Yes Is the patient deaf or have difficulty hearing?: No Does the patient have difficulty seeing, even when wearing  glasses/contacts?: No Does the patient have difficulty concentrating, remembering, or making decisions?: No Patient able to express need for assistance with ADLs?: Yes Does the patient have difficulty dressing or bathing?: Yes Independently performs ADLs?: No Communication: Independent Dressing (OT): Needs assistance Is this a change from baseline?: Change from baseline, expected to last >3 days Grooming: Needs assistance Is this a change from baseline?: Change from baseline, expected to last >3 days Feeding: Needs assistance Is this a change from baseline?: Change from baseline, expected to last >3 days Bathing: Needs assistance Is this a change from baseline?: Change from baseline, expected to last >3 days Toileting: Needs assistance Is this a change from baseline?: Change from baseline, expected to last >3days In/Out Bed: Needs assistance Is this a change from baseline?: Change from baseline, expected to last >3 days Walks in Home: Needs assistance Is this a change from baseline?: Change from baseline, expected to last >3 days Does the patient have difficulty walking or climbing stairs?: Yes (secondary to back pain and weakness) Weakness of Legs: Left Weakness of Arms/Hands: Left  Permission Sought/Granted                  Emotional Assessment Appearance:: Appears stated age Attitude/Demeanor/Rapport: Engaged Affect (typically observed): Accepting Orientation: : Oriented to Self, Oriented to Place, Oriented to  Time, Oriented to Situation   Psych Involvement: No (comment)  Admission diagnosis:  Stroke [facility]) [I63.9] Ischemic stroke of frontal lobe [facility]) [I63.9] Patient Active  Problem List   Diagnosis Date Noted  . Stroke (HCC) 11/24/2019  . AKI (acute kidney injury) (HCC) 11/24/2019  . Spinal stenosis of lumbar region with neurogenic claudication 10/26/2019  . Abnormality of gait 08/02/2019  . Cerebrovascular accident (CVA) (HCC) 07/22/2019  . Dizziness 06/15/2019   . Brain aneurysm 06/15/2019  . Chronic pain syndrome 04/29/2019  . Chronic right-sided low back pain without sciatica 04/12/2019  . Arthritis of shoulder region, left, degenerative 12/03/2013  . Nonspecific abnormal electrocardiogram (ECG) (EKG) 10/15/2013  . Other and unspecified angina pectoris 10/15/2013  . TIA (transient ischemic attack) 11/15/2011  . HTN (hypertension) 11/15/2011  . Diabetes mellitus (HCC) 11/15/2011  . Hypothyroidism 11/15/2011  . Dyslipidemia 11/15/2011  . GERD (gastroesophageal reflux disease) 11/15/2011  . Pyuria 11/15/2011   PCP:  Sigmund Hazel, MD Pharmacy:   [facility] [phone] - [place], [place] - [address] [facility] [address] [place] [address] Phone: [phone] Fax: [phone]     Social Determinants of Health (SDOH) Interventions    Readmission Risk Interventions No flowsheet data found.

## 2019-11-25 NOTE — Progress Notes (Signed)
STROKE TEAM PROGRESS NOTE   INTERVAL HISTORY Husband at bedside.  Patient sitting in chair, stated that he still have mild left hand weakness.  On exam, patient had left hand dexterity difficulty, however strengths are near baseline.  EKG showed sinus arrhythmia with bradycardia, no A. fib.  MRI confirmed right frontal small and left MCA/PCA punctate infarcts.  On Plavix.  BP on the higher end, resume home medication.  Vitals:   11/25/19 0208 11/25/19 0401 11/25/19 0852 11/25/19 1337  BP: (!) 181/75 (!) 147/67 (!) 168/78 (!) 122/54  Pulse: 63 68 76 (!) 53  Resp: 16 19 (!) 21 19  Temp: 98.1 F (36.7 C) 98.2 F (36.8 C) 98.3 F (36.8 C) 98.4 F (36.9 C)  TempSrc: Oral Oral Oral Oral  SpO2: 99% 97% 95% 96%  Weight:      Height:       CBC:  Recent Labs  Lab 11/23/19 2304 11/23/19 2329  WBC 4.0  --   NEUTROABS 2.2  --   HGB 12.6 12.9  HCT 38.8 38.0  MCV 99.2  --   PLT 219  --    Basic Metabolic Panel:  Recent Labs  Lab 11/23/19 2304 11/23/19 2304 11/23/19 2329 11/24/19 1200  NA 137  --  140  --   K 4.2  --  4.0  --   CL 100  --  99  --   CO2 27  --   --   --   GLUCOSE 328*  --  326*  --   BUN 25*  --  25*  --   CREATININE 1.29*   < > 1.30* 1.09*  CALCIUM 8.9  --   --   --    < > = values in this interval not displayed.   Lipid Panel:  Recent Labs  Lab 11/24/19 1400  CHOL 212*  TRIG 130  HDL 58  CHOLHDL 3.7  VLDL 26  LDLCALC 416*   HgbA1c:  Recent Labs  Lab 11/23/19 2304  HGBA1C 7.5*   Urine Drug Screen:  Recent Labs  Lab 11/24/19 0200  LABOPIA NONE DETECTED  COCAINSCRNUR NONE DETECTED  LABBENZ NONE DETECTED  AMPHETMU NONE DETECTED  THCU NONE DETECTED  LABBARB NONE DETECTED    Alcohol Level No results for input(s): ETH in the last 168 hours.  IMAGING past 24 hours No results found.  PHYSICAL EXAM  Temp:  [98 F (36.7 C)-98.4 F (36.9 C)] 98 F (36.7 C) (10/21 1547) Pulse Rate:  [53-76] 53 (10/21 1547) Resp:  [16-27] 21 (10/21  1547) BP: (95-181)/(41-79) 127/75 (10/21 1547) SpO2:  [90 %-99 %] 97 % (10/21 1547)  General - Well nourished, well developed, in no apparent distress.  Ophthalmologic - fundi not visualized due to noncooperation.  Cardiovascular - Regular rhythm and rate.  Mental Status -  Level of arousal and orientation to time, place, and person were intact. Language including expression, naming, repetition, comprehension was assessed and found intact. Fund of Knowledge was assessed and was intact.  Cranial Nerves II - XII - II - Visual field intact OU. III, IV, VI - Extraocular movements intact. V - Facial sensation intact bilaterally. VII - Facial movement intact bilaterally. VIII - Hearing & vestibular intact bilaterally. X - Palate elevates symmetrically XI - Chin turning & shoulder shrug intact bilaterally. XII - Tongue protrusion intact.  Motor Strength - The patient's strength was normal in all extremities and pronator drift was absent.  However, left hand dexterity difficulty.  Bulk  was normal and fasciculations were absent.   Motor Tone - Muscle tone was assessed at the neck and appendages and was normal.  Reflexes - The patient's reflexes were symmetrical in all extremities and she had no pathological reflexes.  Sensory - Light touch, temperature/pinprick were assessed and were symmetrical.    Coordination - The patient had normal movements in the hands and feet with no ataxia or dysmetria.  Tremor was absent.  Gait and Station - deferred.   ASSESSMENT/PLAN Hannah Fischer is a 86 y.o. female with history of type 2 diabetes (A1c 7.5%), hypertension, hyperlipidemia, hypothyroidism, prior pulmonary embolus, degenerative disc disease, single kidney (agenesis) presenting with L hand weakness.   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 Atrophy. Old L  parietooccipital infarct.   MRI  Small R frontal lobe and R occipital lobe infarcts. Old infarcts, small vessel disease, and microhemorrhages and subarachnoid hemorrhage c/w CAA.   MRA head Unremarkable   MRA neck Unremarkable   2D Echo EF 60-65%. No source of embolus. LA moderately dilated  LDL 128  HgbA1c 7.5  EKG sinus arrhythmia with bradycardia, no A. fib  VTE prophylaxis - Lovenox 40 mg sq daily   clopidogrel 75 mg daily prior to admission, now on  home Plavix.  No escalation of antithrombotics due to CAA.  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  This admission MRI again confirmed CAA  Now on Plavix, no escalation of antithrombotics  BP goal less than 140.  Hypertension  Stable . Avoid HTN given CAA . BP goal < 140 . Continue home losartan 100 daily and Inderal 40 . Long-term BP goal normotensive  Hyperlipidemia  Home meds:  Fish oil  Hx myalgias on zocor  LDL 128, goal < 70  Now on Pravachol 40  Continue statin at discharge  Diabetes type II Uncontrolled  HgbA1c 7.5, goal < 7.0  CBGs  SSI  Close PCP follow-up for better DM control  Other Stroke Risk Factors  Advanced age  Obesity, Body mass index is 31.35 kg/m., recommend weight loss, diet and exercise as appropriate   Hx B PE 2012  Other Active Problems  AKI w/o hx CKD  Hospital day # 1  Neurology will sign off. Please call with questions. Pt will follow up with Dr. Terrace Arabia at [facility] in about 4 weeks. Thanks for the consult.  Marvel Plan, MD PhD Stroke Neurology 11/25/2019 6:59 PM  To contact Stroke Continuity provider, please refer to [email or web address]. After hours, contact General Neurology

## 2019-11-25 NOTE — Progress Notes (Signed)
Occupational Therapy Evaluation Patient Details Name: Hannah Fischer MRN: [id number] DOB: 1932/10/09 Today's Date: 11/25/2019    History of Present Illness 86 year old female with a history of prior CVA without residual deficit, TIA, DM, PE, anemia, anxiety, hypertension, hyperlipidemia who presented to the ED on 10/19 with concern for strokelike symptoms of LUE paresthesias and weakness since 10 am on 10/18 as well as LLE heaviness and tongue numbness.   MRI + small acute to subacute infarcts involving R frontal, L parietal and R occipital lobes.  PMH: anxiety, arthritis, chronic pain syndrome, DM, HTN, neuropathy, TIA, back surgery; L Reverse TSA 10/15; R hemi-arthropplasty R   Clinical Impression   PTA, pt lived at home with her husband and was independent with ADL, IADL and mobility. Pt manages her own medication and occasionally drives. Pt presents with apparent sensory motor deficits with LUE in addition to cognitive deficits as noted below. Husband present and states his wife is not at her baseline cognitively after witnessing how she performed on the cognitive screen. Recommend pt follow up with OT at the neuro Outpt center,have S with all medication management and refrain from driving at this time - discussed with pt/husband and they verbalize understanding. Pt also complaining of blurred vision at baseline, however pt with new acute/subacute R Occipital stroke - recommend follow up with an eye doctor and have a full field visual assessment. Will follow acutely to facilitate safe DC home.     Follow Up Recommendations  Outpatient OT;Supervision - Intermittent (neuro outpt)    Equipment Recommendations  None recommended by OT    Recommendations for Other Services       Precautions / Restrictions Precautions Precautions: Fall      Mobility Bed Mobility Overal bed mobility: Modified Independent                  Transfers Overall transfer level: Modified independent                     Balance Overall balance assessment: Mild deficits observed, not formally tested                                         ADL either performed or assessed with clinical judgement   ADL Overall ADL's : Needs assistance/impaired                                     Functional mobility during ADLs: Modified independent General ADL Comments: Recommend S with bathing/dressing tasks. During mobility note decreased attention to position of LUE, dragging in on counter. Pt states "I'm just a little off". Recommend use of shower seat, which they have, to reduce risk of falls. Pt verbalized understanding. Will need S for medication management. Discussed home safety, including concerns for cooking given LUE deficits.      Vision Baseline Vision/History: Wears glasses Patient Visual Report: Blurring of vision Vision Assessment?: Vision impaired- to be further tested in functional context Additional Comments: Pt has glasses but "they don't work". No apparent field deficit however recommend further assessemtn of vision      Perception Perception Comments: Decreased awareness of LUE during mobility; will further assess   Praxis      Pertinent Vitals/Pain Pain Assessment: No/denies pain     Hand Dominance Right  Extremity/Trunk Assessment Upper Extremity Assessment Upper Extremity Assessment: LUE deficits/detail LUE Deficits / Details: AROM grossly within functional limits; difficulty with fine motor/cotron adn in-hand manipulation skills; apparent sensory motor impairment LUE Coordination: decreased fine motor   Lower Extremity Assessment Lower Extremity Assessment: Defer to PT evaluation   Cervical / Trunk Assessment Cervical / Trunk Assessment: Normal   Communication Communication Communication: No difficulties   Cognition Arousal/Alertness: Awake/alert Behavior During Therapy: WFL for tasks assessed/performed Overall Cognitive  Status: Impaired/Different from baseline Area of Impairment: Memory;Attention;Safety/judgement;Awareness;Problem solving                   Current Attention Level: Selective Memory: Decreased short-term memory   Safety/Judgement: Decreased awareness of deficits Awareness: Emergent Problem Solving: Slow processing General Comments: Assessed wtih the Short Blessed TEst of Memory and Concentration, receiving a score of 14/28, indicating cognitive impairment. 1 error counting backwards; unablet o state months of the year in reverse order; difficulty with immediate recall ofname and address and only able to recall 1/5 wrods on delayed recall. Husband states this is not baseline for her.    General Comments       Exercises Exercises: Other exercises Other Exercises Other Exercises: encouraged use of LUE   Shoulder Instructions      Home Living Family/patient expects to be discharged to:: Private residence Living Arrangements: Spouse/significant other Available Help at Discharge: Family;Available 24 hours/day Type of Home: House Home Access: Stairs to enter [employer] of Steps: 3 Entrance Stairs-Rails: Right Home Layout: One level     Bathroom Shower/Tub: IT trainer: Standard Bathroom Accessibility: Yes How Accessible: Accessible via walker Home Equipment: Walker - 2 wheels;Bedside commode          Prior Functioning/Environment Level of Independence: Independent        Comments: drives; cooks/cleans; mows grass with riding lawn mower        OT Problem List: Decreased strength;Impaired vision/perception;Decreased coordination;Decreased cognition;Decreased safety awareness;Decreased knowledge of use of DME or AE;Obesity;Impaired UE functional use      OT Treatment/Interventions: Self-care/ADL training;Therapeutic exercise;Neuromuscular education;DME and/or AE instruction;Therapeutic activities;Cognitive  remediation/compensation;Visual/perceptual remediation/compensation;Patient/family education;Balance training    OT Goals(Current goals can be found in the care plan section) Acute Rehab OT Goals Patient Stated Goal: to go home OT Goal Formulation: With patient Time For Goal Achievement: 12/09/19 Potential to Achieve Goals: Good  OT Frequency: Min 2X/week   Barriers to D/C:            Co-evaluation              AM-PAC OT "6 Clicks" Daily Activity     Outcome Measure Help from another person eating meals?: None Help from another person taking care of personal grooming?: A Little Help from another person toileting, which includes using toliet, bedpan, or urinal?: A Little Help from another person bathing (including washing, rinsing, drying)?: A Little Help from another person to put on and taking off regular upper body clothing?: A Little Help from another person to put on and taking off regular lower body clothing?: A Little 6 Click Score: 19   End of Session Nurse Communication: Mobility status;Other (comment) (DC needs)  Activity Tolerance: Patient tolerated treatment well Patient left: in chair;with call bell/phone within reach;with chair alarm set;with family/visitor present  OT Visit Diagnosis: Unsteadiness on feet (R26.81);Muscle weakness (generalized) (M62.81);Other symptoms and signs involving cognitive function;Low vision, both eyes (H54.2)  Time: 2202-5427 OT Time Calculation (min): 31 min Charges:  OT General Charges $OT Visit: 1 Visit OT Evaluation $OT Eval Low Complexity: 1 Low OT Treatments $Self Care/Home Management : 8-22 mins  Luisa Dago, OT/L   Acute OT Clinical Specialist Acute Rehabilitation Services Pager [phone] Office [phone]   [facility] 11/25/2019, 10:54 AM

## 2019-11-25 NOTE — Progress Notes (Signed)
Telemetry called to report pt having 5beats of V-tach, Dr. Antionette Char on unit notified. No new orders received yet. Will continue to closely monitor. Dionne Bucy RN

## 2019-11-25 NOTE — Evaluation (Signed)
Speech Language Pathology Evaluation Patient Details Name: Hannah Fischer MRN: [id number] DOB: Dec 23, 1932 Today's Date: 11/25/2019 Time: 9562-1308 SLP Time Calculation (min) (ACUTE ONLY): 21 min  Problem List:  Patient Active Problem List   Diagnosis Date Noted  . Stroke (HCC) 11/24/2019  . AKI (acute kidney injury) (HCC) 11/24/2019  . Spinal stenosis of lumbar region with neurogenic claudication 10/26/2019  . Abnormality of gait 08/02/2019  . Cerebrovascular accident (CVA) (HCC) 07/22/2019  . Dizziness 06/15/2019  . Brain aneurysm 06/15/2019  . Chronic pain syndrome 04/29/2019  . Chronic right-sided low back pain without sciatica 04/12/2019  . Arthritis of shoulder region, left, degenerative 12/03/2013  . Nonspecific abnormal electrocardiogram (ECG) (EKG) 10/15/2013  . Other and unspecified angina pectoris 10/15/2013  . TIA (transient ischemic attack) 11/15/2011  . HTN (hypertension) 11/15/2011  . Diabetes mellitus (HCC) 11/15/2011  . Hypothyroidism 11/15/2011  . Dyslipidemia 11/15/2011  . GERD (gastroesophageal reflux disease) 11/15/2011  . Pyuria 11/15/2011   Past Medical History:  Past Medical History:  Diagnosis Date  . Abnormal nuclear stress test    2005, normal cath Dr. Elease Hashimoto, normal stress test 2012 Dr. Eldridge Dace  . Anemia    chronic  . Anxiety   . Arthritis    djd  . Arthritis of shoulder region, left, degenerative 12/03/2013  . Blood transfusion 1979   for anemia  . Chest pain    evaluated @ [facility] cardiology; had a stress test 2012  . Chronic pain syndrome 04/29/2019  . Chronic right-sided low back pain without sciatica 04/12/2019  . Complication of anesthesia    unable to breathe lying  flat on back  . Diabetes mellitus    type 2  niddm x 25 yrs  . Diabetes mellitus (HCC) 11/15/2011  . Diverticular disease   . DJD (degenerative joint disease)    lumbar and cervical  . GERD (gastroesophageal reflux disease)   . Hyperlipemia   . Hypertension    on  medication  . Hypothyroidism    takes levoxyl  . Irritable bowel syndrome   . 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 Neuromuscular disorder (HCC)   . Neuropathy in diabetes (HCC)   . Nonspecific abnormal electrocardiogram (ECG) (EKG) 10/15/2013  . Other and unspecified angina pectoris    tests came back NOT heart related  . Pulmonary embolism (HCC) 06-2010   bilateral  . Pyuria 11/15/2011  . Seizures (HCC)    once, age 37  . TIA (transient ischemic attack) 11/15/2011   Past Surgical History:  Past Surgical History:  Procedure Laterality Date  . BACK SURGERY    . BLEPHAROPLASTY  2012   rt eye  . CARDIAC CATHETERIZATION     had one 10/2013  . CHOLECYSTECTOMY    . EYE SURGERY  2007   cataract ext/ iol rt eye  . JOINT REPLACEMENT  2001,2005   both knees  . LEFT HEART CATHETERIZATION WITH CORONARY ANGIOGRAM N/A 10/18/2013   Procedure: LEFT HEART CATHETERIZATION WITH CORONARY ANGIOGRAM;  Surgeon: Corky Crafts, MD;  Location: [facility] CATH LAB;  Service: Cardiovascular;  Laterality: N/A;  . LUMBAR DISC SURGERY  1990  . REVERSE SHOULDER ARTHROPLASTY Left 12/03/2013   Procedure: LEFT SHOULDER REVERSE ARTHROPLASTY;  Surgeon: Verlee Rossetti, MD;  Location: [facility] OR;  Service: Orthopedics;  Laterality: Left;  . SHOULDER HEMI-ARTHROPLASTY  12/25/2010   Procedure: SHOULDER HEMI-ARTHROPLASTY;  Surgeon: Cammy Copa;  Location: [facility] OR;  Service: Orthopedics;  Laterality: Right;   HPI:  86 year old female with a history of prior CVA without residual deficit, TIA, DM, PE, anemia, anxiety, hypertension, hyperlipidemia who presented to the ED on 10/19 with concern for strokelike symptoms of LUE paresthesias and weakness since 10 am on 10/18 as well as LLE heaviness and tongue numbness.   MRI + small acute to subacute infarcts involving R frontal, L parietal and R occipital lobes.  PMH: anxiety, arthritis, chronic pain syndrome, DM, HTN, neuropathy, TIA, back surgery; L Reverse TSA  10/15; R hemi-arthropplasty R   Assessment / Plan / Recommendation Clinical Impression  Pt presents with cognitive impairments that are an acute change from her baseline, as she describes herself as being very independent with a "good memory." She scored a 20/30 on the SLUMS (27 or above considered to be [facility]), with a lot of her difficulties seemingly coming from memory deficits. She has trouble with working memory that limits her ability to complete tasks accurately because she cannot remember details from the instructions. At times, even when reminded, she has difficulty with problem solving and calculations. Given this acute change, pt would benefit from OP SLP services to maximize functional cognition and independence. Would also recommend 24/7 supervision upon return home for safety.     SLP Assessment  SLP Recommendation/Assessment: Patient needs continued Speech Lanaguage Pathology Services SLP Visit Diagnosis: Cognitive communication deficit (R41.841)    Follow Up Recommendations  Outpatient SLP;24 hour supervision/assistance    Frequency and Duration min 2x/week  2 weeks      SLP Evaluation Cognition  Overall Cognitive Status: Impaired/Different from baseline Arousal/Alertness: Awake/alert Orientation Level: Oriented X4 Attention: Selective Selective Attention: Impaired Selective Attention Impairment: Verbal basic Memory: Impaired Memory Impairment: Storage deficit;Retrieval deficit;Decreased short term memory;Other (comment) (working memory) Decreased Short Term Memory: Verbal basic Awareness: Impaired Awareness Impairment: Emergent impairment Problem Solving: Impaired Problem Solving Impairment: Verbal basic Safety/Judgment: Impaired       Comprehension  Auditory Comprehension Overall Auditory Comprehension: Impaired Commands: Within Functional Limits Conversation: Simple Interfering Components: Attention;Working memory EffectiveTechniques: Repetition;Slowed speech     Expression Expression Primary Mode of Expression: Verbal Verbal Expression Overall Verbal Expression: Appears within functional limits for tasks assessed Written Expression Dominant Hand: Right   Oral / Motor  Motor Speech Overall Motor Speech: Appears within functional limits for tasks assessed   GO                    Mahala Menghini., M.A. CCC-SLP Acute Rehabilitation Services Pager [phone] Office [phone]  11/25/2019, 11:54 AM

## 2019-11-25 NOTE — Consult Note (Addendum)
Neurology Consultation Reason for Consult: Stroke on MRI Referring Physician: Dr. Whitney Post    CC: Left hand loss of control    History is obtained from: Patient and chart review   HPI: Hannah Fischer is a 86 y.o. female with a past medical history significant for type 2 diabetes (A1c 7.5%), hypertension, hyperlipidemia, hypothyroidism, prior pulmonary embolus, degenerative disc disease, single kidney (agenesis),  She describes that she was in her usual state of health when she began to notice left hand weakness that came on acutely and has not improved since it began.  MRI revealed stroke for which neurology was consulted.  Review of systems is notable with positive for intermittent palpitations sometimes associated with feeling weak, chronic fatigue secondary to chronic poor sleep, mild headache for the past year, but otherwise negative for vision changes, hearing changes, other focal weakness or numbness, bowel or bladder symptoms, rashes, fevers, chills, sweats, marked changes in weight, difficulty speaking or swallowing,  LKW: 10 AM on 10/19 tPA given?: No, due to out of the window; additionally this will be contraindicated in the future given MRI findings concerning for CAA   Premorbid modified rankin scale:      0 - No symptoms.    Past Medical History:  Diagnosis Date  . Abnormal nuclear stress test    2005, normal cath Dr. Elease Hashimoto, normal stress test 2012 Dr. Eldridge Dace  . Anemia    chronic  . Anxiety   . Arthritis    djd  . Arthritis of shoulder region, left, degenerative 12/03/2013  . Blood transfusion 1979   for anemia  . Chest pain    evaluated @ [facility] cardiology; had a stress test 2012  . Chronic pain syndrome 04/29/2019  . Chronic right-sided low back pain without sciatica 04/12/2019  . Complication of anesthesia    unable to breathe lying  flat on back  . Diabetes mellitus    type 2  niddm x 25 yrs  . Diabetes mellitus (HCC) 11/15/2011  . Diverticular disease    . DJD (degenerative joint disease)    lumbar and cervical  . GERD (gastroesophageal reflux disease)   . Hyperlipemia   . Hypertension    on medication  . Hypothyroidism    takes levoxyl  . Irritable bowel syndrome   . 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 Neuromuscular disorder (HCC)   . Neuropathy in diabetes (HCC)   . Nonspecific abnormal electrocardiogram (ECG) (EKG) 10/15/2013  . Other and unspecified angina pectoris    tests came back NOT heart related  . Pulmonary embolism (HCC) 06-2010   bilateral  . Pyuria 11/15/2011  . Seizures (HCC)    once, age 61  . TIA (transient ischemic attack) 11/15/2011   Past Surgical History:  Procedure Laterality Date  . BACK SURGERY    . BLEPHAROPLASTY  2012   rt eye  . CARDIAC CATHETERIZATION     had one 10/2013  . CHOLECYSTECTOMY    . EYE SURGERY  2007   cataract ext/ iol rt eye  . JOINT REPLACEMENT  2001,2005   both knees  . LEFT HEART CATHETERIZATION WITH CORONARY ANGIOGRAM N/A 10/18/2013   Procedure: LEFT HEART CATHETERIZATION WITH CORONARY ANGIOGRAM;  Surgeon: Corky Crafts, MD;  Location: [facility] CATH LAB;  Service: Cardiovascular;  Laterality: N/A;  . LUMBAR DISC SURGERY  1990  . REVERSE SHOULDER ARTHROPLASTY Left 12/03/2013   Procedure: LEFT SHOULDER REVERSE ARTHROPLASTY;  Surgeon: Verlee Rossetti, MD;  Location: [facility] OR;  Service: Orthopedics;  Laterality: Left;  . SHOULDER HEMI-ARTHROPLASTY  12/25/2010   Procedure: SHOULDER HEMI-ARTHROPLASTY;  Surgeon: Cammy Copa;  Location: [facility] OR;  Service: Orthopedics;  Laterality: Right;   Current Outpatient Medications  Medication Instructions  . ALPRAZolam (XANAX) 0.25 mg, Oral, At bedtime PRN  . cephALEXin (KEFLEX) 500 mg, Oral, 2 times daily  . clopidogrel (PLAVIX) 75 mg, Oral, Daily with breakfast  . Cyanocobalamin (VITAMIN B-12 PO) 1 tablet, Oral, Daily  . DULoxetine (CYMBALTA) 30 MG capsule TAKE 1 CAPSULE(30 MG) BY MOUTH DAILY  . levothyroxine (SYNTHROID) 100  mcg, Oral, Daily  . losartan (COZAAR) 100 mg, Oral, Daily  . methocarbamol (ROBAXIN) 500 mg, Oral, Every 8 hours PRN  . Omega-3 Fatty Acids (FISH OIL) 1000 MG CAPS 1 capsule, Oral, 2 times daily  . pioglitazone (ACTOS) 45 mg, Daily  . propranolol (INDERAL) 40 mg, Oral, Daily  . traZODone (DESYREL) 50 MG tablet TAKE 1 TABLET BY MOUTH AT BEDTIME  . Vitamin D3 2,000 Units, Daily    Family History  Problem Relation Age of Onset  . Diabetes Mother   . Heart disease Mother   . Heart disease Sister   . Heart disease Brother     Social History:  reports that she has never smoked. She has never used smokeless tobacco. She reports that she does not drink alcohol and does not use drugs.   Exam: Current vital signs: BP (!) 147/67 (BP Location: Right Arm)   Pulse 68   Temp 98.2 F (36.8 C) (Oral)   Resp 19   Ht 5\' 3"  (1.6 m)   Wt 80.3 kg   SpO2 97%   BMI 31.35 kg/m  Vital signs in last 24 hours: Temp:  [98.1 F (36.7 C)-98.2 F (36.8 C)] 98.2 F (36.8 C) (10/21 0401) Pulse Rate:  [53-88] 68 (10/21 0401) Resp:  [15-27] 19 (10/21 0401) BP: (95-196)/(41-91) 147/67 (10/21 0401) SpO2:  [90 %-99 %] 97 % (10/21 0401)   Physical Exam  Constitutional: Appears well-developed and well-nourished.  Psych: Affect appropriate to situation Eyes: No scleral injection HENT: No OP obstrucion MSK: no joint deformities.  Cardiovascular: Normal rate and regular rhythm.  Respiratory: Effort normal, non-labored breathing GI: Soft.  No distension. There is no tenderness.  Skin: WDI  Neuro: Mental Status: Patient is awake, alert, oriented to person, place, month, year, and situation. Patient is able to give a clear and coherent history. No signs of aphasia or neglect She cannot remember the name of the current president, but names Trump is the prior president Cranial Nerves: II: Visual Fields are full. Pupils are equal, round, and reactive to light.   III,IV, VI: EOMI without ptosis or  diploplia.  V: Facial sensation is symmetric to temperature VII: Facial movement is symmetric.  VIII: hearing is intact to voice X: Uvula elevates symmetrically XI: Shoulder shrug is symmetric. XII: tongue is midline without atrophy or fasciculations.  Motor: Tone is normal. Bulk is normal. 5/5 strength was present in all four extremities with the exception of the left wrist extension (4/5) and finger flexion (4-/5) Sensory: Sensation is symmetric to light touch and temperature in the arms and legs; subtly decreased in the left hand Deep Tendon Reflexes: 2+ and symmetric in the biceps and patellae.  Plantars: Toes are downgoing bilaterally.  Cerebellar: FNF and HKS are intact bilaterally   I have reviewed labs in epic and the results pertinent to this consultation are:   Lab Results  Component  Value Date   CHOL 212 (H) 11/24/2019   HDL 58 11/24/2019   LDLCALC 128 (H) 11/24/2019   TRIG 130 11/24/2019   CHOLHDL 3.7 11/24/2019   Lab Results  Component Value Date   HGBA1C 7.5 (H) 11/23/2019   I have reviewed the images obtained personally:  HCT without clear acute abnormality, chronic findings as detailed in radiology report MRI brain with multifocal small strokes in the posterior circulation bilaterally as well as SWI imaging consistent with CAA MRA with scattered atherosclerotic changes   ECHO w/ grade II diastolic dysfunction, mild cLVH moderate left atrial enlargementn  Impression: The largest stroke on MRI is right at the left cortical hand knob, correlating well with patient's symptoms.  Given the pattern, I favor an atheroembolic or cardioembolic shower of the posterior circulation.  However treatment is complicated by imaging findings of CAA.  Interestingly she does not appear to have much cognitive impairment on at least a cursory evaluation of the same.  Given high risk of life-threatening hemorrhage with cerebral amyloid angiopathy, I favor discontinuing Plavix if the  patient is cleared by cardiology to discontinue it.   Recommendations:  # scattered small posterior strokes, most likely atheroembolic, possibly cardioembolic # MRI w/ c/f cerebral amyloid angiopathy (CAA) - Stroke labs HgbA1c, fasting lipid panel completed as above with need for improvement in cholesterol control (goal LDL < 70) and A1c (goal < 7%) - MRI brain completed as above  - MRA of the brain without contrast and MRA neck w/wo completed as above - Echocardiogram - Despite strokes, given CAA findings with high risk of life-threatening lobar hemorrhage, please discuss with cardiology if Plavix can be discontinued - Risk factor modification - Telemetry monitoring; 30 day event monitor on discharge if no arrythmias captured  - Blood pressure goal   - Normotension - PT consult, OT consult, Speech consult, - Stroke team to follow  Brooke Dare MD-PhD [facility] [phone]

## 2019-11-25 NOTE — Evaluation (Signed)
Physical Therapy Evaluation Patient Details Name: Hannah Fischer MRN: [id number] DOB: [birth year] Today's Date: 11/25/2019   History of Present Illness  86 year old female with a history of prior CVA without residual deficit, TIA, DM, PE, anemia, anxiety, hypertension, hyperlipidemia who presented to the ED on 10/19 with concern for strokelike symptoms of LUE paresthesias and weakness since 10 am on 10/18 as well as LLE heaviness and tongue numbness.   MRI + small acute to subacute infarcts involving R frontal, L parietal and R occipital lobes.  PMH: anxiety, arthritis, chronic pain syndrome, DM, HTN, neuropathy, TIA, back surgery; L Reverse TSA 10/15; R hemi-arthropplasty R  Clinical Impression  Pt presents to PT with deficits in balance, sensation, coordination, strength, and gait. Pt with increased sway when ambulating although no significant losses of balance noted with dynamic gait tasks. Pt with impaired sensation and coordination of LUE for fine motor tasks. Pt will benefit from continued acute PT POC and outpatient PT services at the time of discharge to aide in restoring independence in ambulation and mobility. PT recommends the pt utilize her cane for all out of bed mobility at this time.    Follow Up Recommendations Supervision for mobility/OOB;Outpatient PT    Equipment Recommendations  None recommended by PT (pt owns necessary DME)    Recommendations for Other Services       Precautions / Restrictions Precautions Precautions: Fall Restrictions Weight Bearing Restrictions: No      Mobility  Bed Mobility Overal bed mobility: Modified Independent                  Transfers Overall transfer level: Needs assistance Equipment used: None Transfers: Sit to/from Stand Sit to Stand: Supervision            Ambulation/Gait Ambulation/Gait assistance: Supervision Gait Distance (Feet): 150 Feet Assistive device: None Gait Pattern/deviations: Step-through  pattern;Drifts right/left Gait velocity: reduced Gait velocity interpretation: <1.8 ft/sec, indicate of risk for recurrent falls General Gait Details: pt with increased sway, drifting laterally, no LOB noted  Stairs            Wheelchair Mobility    Modified Rankin (Stroke Patients Only)       Balance Overall balance assessment: Needs assistance Sitting-balance support: No upper extremity supported;Feet supported Sitting balance-Leahy Scale: Good     Standing balance support: No upper extremity supported;During functional activity Standing balance-Leahy Scale: Fair Standing balance comment: pt requires close supervision for dynamic balance challenges                             Pertinent Vitals/Pain Pain Assessment: No/denies pain    Home Living Family/patient expects to be discharged to:: Private residence Living Arrangements: Spouse/significant other Available Help at Discharge: Family;Available 24 hours/day Type of Home: House Home Access: Stairs to enter Entrance Stairs-Rails: Right Entrance Stairs-Number of Steps: 2 Home Layout: One level Home Equipment: Cane - single point (upright walker)      Prior Function Level of Independence: Independent         Comments: drives; cooks/cleans; mows grass with riding lawn mower     Hand Dominance   Dominant Hand: Right    Extremity/Trunk Assessment   Upper Extremity Assessment Upper Extremity Assessment: Defer to OT evaluation LUE Deficits / Details: AROM grossly within functional limits; difficulty with fine motor/cotron adn in-hand manipulation skills; apparent sensory motor impairment LUE Coordination: decreased fine motor    Lower Extremity Assessment Lower  Extremity Assessment: Overall WFL for tasks assessed    Cervical / Trunk Assessment Cervical / Trunk Assessment: Normal  Communication   Communication: No difficulties  Cognition Arousal/Alertness: Awake/alert Behavior During  Therapy: WFL for tasks assessed/performed Overall Cognitive Status: Impaired/Different from baseline Area of Impairment: Problem solving                   Current Attention Level: Selective Memory: Decreased short-term memory   Safety/Judgement: Decreased awareness of deficits Awareness: Emergent Problem Solving: Slow processing General Comments: Assessed wtih the Short Blessed TEst of Memory and Concentration, receiving a score of 14/28, indicating cognitive impairment. 1 error counting backwards; unablet o state months of the year in reverse order; difficulty with immediate recall ofname and address and only able to recall 1/5 wrods on delayed recall. Husband states this is not baseline for her.       General Comments General comments (skin integrity, edema, etc.): VSS on RA    Exercises Other Exercises Other Exercises: encouraged use of LUE   Assessment/Plan    PT Assessment Patient needs continued PT services  PT Problem List Decreased strength;Decreased balance;Decreased mobility;Impaired sensation       PT Treatment Interventions DME instruction;Gait training;Stair training;Functional mobility training;Therapeutic activities;Balance training;Neuromuscular re-education;Patient/family education    PT Goals (Current goals can be found in the Care Plan section)  Acute Rehab PT Goals Patient Stated Goal: To go home PT Goal Formulation: With patient Time For Goal Achievement: 12/09/19 Potential to Achieve Goals: Good    Frequency Min 4X/week   Barriers to discharge        Co-evaluation               AM-PAC PT "6 Clicks" Mobility  Outcome Measure Help needed turning from your back to your side while in a flat bed without using bedrails?: None Help needed moving from lying on your back to sitting on the side of a flat bed without using bedrails?: None Help needed moving to and from a bed to a chair (including a wheelchair)?: None Help needed standing up  from a chair using your arms (e.g., wheelchair or bedside chair)?: None Help needed to walk in hospital room?: None Help needed climbing 3-5 steps with a railing? : A Little 6 Click Score: 23    End of Session   Activity Tolerance: Patient tolerated treatment well Patient left: in chair;with call bell/phone within reach;with chair alarm set Nurse Communication: Mobility status PT Visit Diagnosis: Unsteadiness on feet (R26.81);Other symptoms and signs involving the nervous system (R29.898)    Time: 8889-1694 PT Time Calculation (min) (ACUTE ONLY): 14 min   Charges:   PT Evaluation $PT Eval Low Complexity: 1 Low          Arlyss Gandy, PT, DPT Acute Rehabilitation Pager: [phone]   Arlyss Gandy 11/25/2019, 11:03 AM

## 2019-11-25 NOTE — Progress Notes (Addendum)
PROGRESS NOTE    Hannah Fischer  ZOX:[id number] DOB: [birth year] DOA: 11/23/2019 PCP: Sigmund Hazel, MD   Brief Narrative:  This is a very pleasant 86 year old female with a history of prior CVA without residual deficit, TIA, DM, PE, anemia, anxiety, hypertension, hyperlipidemia who presented to the ED on 10/19 with concern for strokelike symptoms of LUE paresthesias and weakness since 10 am on 10/18 as well as LLE heaviness and tongue numbness.  States her symptoms began while she was laying in bed.  States that she continues to have similar symptoms without much improvement. She was recently seen for dizziness by her neurologist, Dr.Yan, on 9/21 who continued the patient's plavix due to history of strokes seen on MRI and risk factors. In ED: patient afebrile, hypertensive on room air. Notable labs: Glucose 328, BUN 25, Cr 1.29 (baseline 0.9), otherwise with pyuria and rare bacteria on UA.  CT head with chronic left parietooccipital infarct. ED PA discussed with neurology who recommended an MRI/MRA which were most notable for: small acute to subacute infarcts involving the right frontal lobe, left parietal lobe and right occipital lobe. Neurology contacted by ED staff prior to admission.    Assessment & Plan:   Principal Problem:   Stroke [facility]) Active Problems:   HTN (hypertension)   Diabetes mellitus (HCC)   Dyslipidemia   AKI (acute kidney injury) (HCC)   Acute to subacute right frontal lobe (precentral gyrus), left parietal lobe and right occipital lobe infarcts stable MRI shows small acute to subacute infarcts involving right frontal lobe (precentral gyrus), left parietal lobe, and right occipital lobe; imaging also concerning for amyloid angiopathy. Did not receive TPA as she was out of the window Patient would likely benefit from event Curr her in the outpatient settingently on Plavix, not on a statin due to history of muscle weakness with statin use -consider QoD or 2 times weekly  trial to adjust for tolerance Discontinue Plavix given amyloid angiopathy per neurology recommendations. Echo -- unremarkabble Lipid panel remarkable for cholesterol 212 LDL 128 otherwise within normal limits A1c 7.5  AKI without history of CKD Continue IV fluids, transition off once p.o. intake improves Hold losartan  Hypertension, stable Permissive HTN --> Hold losartan   Hyperlipidemia Not on any lipid lowering therapy Has a history of muscle weakness with statins Lipid panel pending  Type 2 diabetes with hyperglycemia Hb A1c 7.5 as above Lantus 10 u daily with Novolog 3 u TID with meals and sliding scale   DVT prophylaxis: Lovenox Code Status: Full Family Communication: None present  Status is: Inpatient  Dispo: The patient is from: Home              Anticipated d/c is to: To be determined              Anticipated d/c date is: 24 to 48 hours              Patient currently not medically stable for discharge given ongoing need for neurological evaluation, imaging and physical therapy.  Consultants:   Neurology  Procedures:   None planned  Antimicrobials:  None indicated  Subjective: No acute issues or events overnight denies nausea, vomiting, diarrhea, constipation, headache, fever, chills  Objective: Vitals:   11/25/19 0000 11/25/19 0030 11/25/19 0208 11/25/19 0401  BP: (!) 106/47 95/74 (!) 181/75 (!) 147/67  Pulse: 66 70 63 68  Resp: Temp:   98.1 F (36.7 C) 98.2 F (36.8 C)  TempSrc:   Oral Oral  SpO2: 92% 92% 99% 97%  Weight:      Height:        Intake/Output Summary (Last 24 hours) at 11/25/2019 0727 Last data filed at 11/25/2019 0300 Gross per 24 hour  Intake 987.5 ml  Output --  Net 987.5 ml   Filed Weights   11/23/19 2245  Weight: 80.3 kg    Examination:  General exam: Appears calm and comfortable  Respiratory system: Clear to auscultation. Respiratory effort normal. Cardiovascular system: S1 & S2 heard, RRR. No  JVD, murmurs, rubs, gallops or clicks. No pedal edema. Gastrointestinal system: Abdomen is nondistended, soft and nontender. No organomegaly or masses felt. Normal bowel sounds heard. Central nervous system: Alert and oriented. No focal neurological deficits. Extremities: Symmetric 5 x 5 power. Skin: No rashes, lesions or ulcers Psychiatry: Judgement and insight appear normal. Mood & affect appropriate.     Data Reviewed: I have personally reviewed following labs and imaging studies  CBC: Recent Labs  Lab 11/23/19 2304 11/23/19 2329  WBC 4.0  --   NEUTROABS 2.2  --   HGB 12.6 12.9  HCT 38.8 38.0  MCV 99.2  --   PLT 219  --    Basic Metabolic Panel: Recent Labs  Lab 11/23/19 2304 11/23/19 2329 11/24/19 1200  NA 137 140  --   K 4.2 4.0  --   CL 100 99  --   CO2 27  --   --   GLUCOSE 328* 326*  --   BUN 25* 25*  --   CREATININE 1.29* 1.30* 1.09*  CALCIUM 8.9  --   --    GFR: Estimated Creatinine Clearance: 38.6 mL/min (A) (by C-G formula based on SCr of 1.09 mg/dL (H)). Liver Function Tests: Recent Labs  Lab 11/23/19 2304  AST 21  ALT 9  ALKPHOS 73  BILITOT 0.9  PROT 6.8  ALBUMIN 3.7   No results for input(s): LIPASE, AMYLASE in the last 168 hours. No results for input(s): AMMONIA in the last 168 hours. Coagulation Profile: Recent Labs  Lab 11/23/19 2304  INR 0.9   Cardiac Enzymes: No results for input(s): CKTOTAL, CKMB, CKMBINDEX, TROPONINI in the last 168 hours. BNP (last 3 results) No results for input(s): PROBNP in the last 8760 hours. HbA1C: Recent Labs    11/23/19 2304  HGBA1C 7.5*   CBG: Recent Labs  Lab 11/24/19 1214 11/24/19 1727  GLUCAP 224* 175*   Lipid Profile: Recent Labs    11/24/19 1400  CHOL 212*  HDL 58  LDLCALC 128*  TRIG 130  CHOLHDL 3.7   Thyroid Function Tests: No results for input(s): TSH, T4TOTAL, FREET4, T3FREE, THYROIDAB in the last 72 hours. Anemia Panel: No results for input(s): VITAMINB12, FOLATE,  FERRITIN, TIBC, IRON, RETICCTPCT in the last 72 hours. Sepsis Labs: No results for input(s): PROCALCITON, LATICACIDVEN in the last 168 hours.  Recent Results (from the past 240 hour(s))  Respiratory Panel by RT PCR (Flu A&B, Covid) - Nasopharyngeal Swab     Status: None   Collection Time: 11/24/19  8:31 AM   Specimen: Nasopharyngeal Swab  Result Value Ref Range Status   SARS Coronavirus 2 by RT PCR NEGATIVE NEGATIVE Final    Comment: (NOTE) SARS-CoV-2 target nucleic acids are NOT DETECTED.  The SARS-CoV-2 RNA is generally detectable in upper respiratoy specimens during the acute phase of infection. The lowest concentration of SARS-CoV-2 viral copies this assay can detect is 131 copies/mL. A negative result does  not preclude SARS-Cov-2 infection and should not be used as the sole basis for treatment or other patient management decisions. A negative result may occur with  improper specimen collection/handling, submission of specimen other than nasopharyngeal swab, presence of viral mutation(s) within the areas targeted by this assay, and inadequate number of viral copies (<131 copies/mL). A negative result must be combined with clinical observations, patient history, and epidemiological information. The expected result is Negative.  Fact Sheet for Patients:  https://www.moore.com/  Fact Sheet for Healthcare Providers:  https://www.young.biz/  This test is no t yet approved or cleared by the [place] FDA and  has been authorized for detection and/or diagnosis of SARS-CoV-2 by FDA under an Emergency Use Authorization (EUA). This EUA will remain  in effect (meaning this test can be used) for the duration of the COVID-19 declaration under Section 564(b)(1) of the Act, 21 U.S.C. section 360bbb-3(b)(1), unless the authorization is terminated or revoked sooner.     Influenza A by PCR NEGATIVE NEGATIVE Final   Influenza B by PCR NEGATIVE  NEGATIVE Final    Comment: (NOTE) The Xpert Xpress SARS-CoV-2/FLU/RSV assay is intended as an aid in  the diagnosis of influenza from Nasopharyngeal swab specimens and  should not be used as a sole basis for treatment. Nasal washings and  aspirates are unacceptable for Xpert Xpress SARS-CoV-2/FLU/RSV  testing.  Fact Sheet for Patients: https://www.moore.com/  Fact Sheet for Healthcare Providers: https://www.young.biz/  This test is not yet approved or cleared by the [place] FDA and  has been authorized for detection and/or diagnosis of SARS-CoV-2 by  FDA under an Emergency Use Authorization (EUA). This EUA will remain  in effect (meaning this test can be used) for the duration of the  Covid-19 declaration under Section 564(b)(1) of the Act, 21  U.S.C. section 360bbb-3(b)(1), unless the authorization is  terminated or revoked. Performed at [facility], [address], [place], [place] [address]          Radiology Studies: CT Head Wo Contrast  Result Date: 11/23/2019 CLINICAL DATA:  Left-sided facial droop. EXAM: CT HEAD WITHOUT CONTRAST TECHNIQUE: Contiguous axial images were obtained from the base of the skull through the vertex without intravenous contrast. COMPARISON:  November 15, 2011 FINDINGS: Brain: There is mild cerebral atrophy with widening of the extra-axial spaces and ventricular dilatation. There are areas of decreased attenuation within the white matter tracts of the supratentorial brain, consistent with microvascular disease changes. A small area of cortical encephalomalacia, with adjacent chronic white matter low attenuation, is seen within the left parietooccipital region. Vascular: No hyperdense vessel or unexpected calcification. Skull: Normal. Negative for fracture or focal lesion. Sinuses/Orbits: No acute finding. Other: None. IMPRESSION: 1. Generalized cerebral atrophy. 2. Small, chronic left  parietooccipital infarct. 3. No acute intracranial abnormality. Electronically Signed   By: Aram Candela M.D.   On: 11/23/2019 23:24   MR ANGIO HEAD WO CONTRAST  Result Date: 11/24/2019 CLINICAL DATA:  Left facial droop EXAM: MRI HEAD WITHOUT CONTRAST MRA HEAD WITHOUT CONTRAST MRA NECK WITHOUT AND WITH CONTRAST TECHNIQUE: Multiplanar, multiecho pulse sequences of the brain and surrounding structures were obtained without intravenous contrast. Angiographic images of the Circle of Willis were obtained using MRA technique without intravenous contrast. Angiographic images of the neck were obtained using MRA technique without and with intravenous contrast. Carotid stenosis measurements (when applicable) are obtained utilizing NASCET criteria, using the distal internal carotid diameter as the denominator. CONTRAST:  44mL GADAVIST GADOBUTROL 1 MMOL/ML IV SOLN  COMPARISON:  MRI brain 07/20/2019 FINDINGS: MRI HEAD Brain: There is a small focus diffusion hyperintensity and ADC isointensity involving the right precentral gyrus hand motor region. Additional small focus of reduced diffusion in the parasagittal inferior left parietal lobe. Possible additional small focus of reduced diffusion in the right occipital lobe. Chronic left parietooccipital infarct again seen with associated chronic blood products. Right anterior temporal encephalomalacia is also again identified. Additional patchy and confluent areas of T2 hyperintensity in the supratentorial white matter nonspecific but probably reflect stable chronic microvascular ischemic changes. Prominence of the ventricles and sulci reflects stable volume loss. There is evidence of prior subarachnoid hemorrhage on susceptibility weighted imaging. Numerous small foci of susceptibility are present particularly involving the posterior cerebrum likely reflecting chronic microhemorrhages. There is no intracranial mass or mass effect. Vascular: Major vessel flow voids at the  skull base are preserved. Skull and upper cervical spine: Normal marrow signal is preserved. Sinuses/Orbits: Mild mucosal thickening. Bilateral lens replacements. Other: Sella is unremarkable.  Mastoid air cells are clear. MRA HEAD Intracranial internal carotid arteries are patent with atherosclerotic irregularity left greater than right. Anterior cerebral arteries are patent. Right A1 ACA is dominant. 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 Middle cerebral arteries are patent. Intracranial left vertebral artery is patent. The right vertebral artery is patent on postcontrast MRA neck. May terminate as a PICA. Basilar artery is patent. Posterior cerebral arteries are patent with atherosclerotic irregularity there is no anteriorly directed aneurysm left posteriorly from the anterior genu posterior. MRA NECK Common, internal, and external carotid arteries are patent. There is no hemodynamically significant stenosis at the ICA origins. Extracranial vertebral arteries are patent. Left vertebral artery is dominant. Possible stenosis at the right vertebral origin, which is not well evaluated due to artifact. IMPRESSION: Small acute to subacute infarcts involving right frontal lobe (precentral gyrus), left parietal lobe, and right occipital lobe. Stable chronic findings including chronic infarcts, chronic microvascular ischemic changes, and chronic microhemorrhages and subarachnoid blood products in a pattern suggesting amyloid angiopathy. No large vessel occlusion. No hemodynamically significant stenosis at the ICA origins. Electronically Signed   By: Guadlupe Spanish M.D.   On: 11/24/2019 07:55   MR ANGIO NECK W WO CONTRAST  Result Date: 11/24/2019 CLINICAL DATA:  Left facial droop EXAM: MRI HEAD WITHOUT CONTRAST MRA HEAD WITHOUT CONTRAST MRA NECK WITHOUT AND WITH CONTRAST TECHNIQUE: Multiplanar, multiecho pulse sequences of the brain and surrounding structures were obtained without intravenous contrast.  Angiographic images of the Circle of Willis were obtained using MRA technique without intravenous contrast. Angiographic images of the neck were obtained using MRA technique without and with intravenous contrast. Carotid stenosis measurements (when applicable) are obtained utilizing NASCET criteria, using the distal internal carotid diameter as the denominator. CONTRAST:  8mL GADAVIST GADOBUTROL 1 MMOL/ML IV SOLN COMPARISON:  MRI brain 07/20/2019 FINDINGS: MRI HEAD Brain: There is a small focus diffusion hyperintensity and ADC isointensity involving the right precentral gyrus hand motor region. Additional small focus of reduced diffusion in the parasagittal inferior left parietal lobe. Possible additional small focus of reduced diffusion in the right occipital lobe. Chronic left parietooccipital infarct again seen with associated chronic blood products. Right anterior temporal encephalomalacia is also again identified. Additional patchy and confluent areas of T2 hyperintensity in the supratentorial white matter nonspecific but probably reflect stable chronic microvascular ischemic changes. Prominence of the ventricles and sulci reflects stable volume loss. There is evidence of prior subarachnoid hemorrhage on susceptibility weighted imaging. Numerous small foci of susceptibility  are present particularly involving the posterior cerebrum likely reflecting chronic microhemorrhages. There is no intracranial mass or mass effect. Vascular: Major vessel flow voids at the skull base are preserved. Skull and upper cervical spine: Normal marrow signal is preserved. Sinuses/Orbits: Mild mucosal thickening. Bilateral lens replacements. Other: Sella is unremarkable.  Mastoid air cells are clear. MRA HEAD Intracranial internal carotid arteries are patent with atherosclerotic irregularity left greater than right. Anterior cerebral arteries are patent. Right A1 ACA is dominant. 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 Middle  cerebral arteries are patent. Intracranial left vertebral artery is patent. The right vertebral artery is patent on postcontrast MRA neck. May terminate as a PICA. Basilar artery is patent. Posterior cerebral arteries are patent with atherosclerotic irregularity there is no anteriorly directed aneurysm left posteriorly from the anterior genu posterior. MRA NECK Common, internal, and external carotid arteries are patent. There is no hemodynamically significant stenosis at the ICA origins. Extracranial vertebral arteries are patent. Left vertebral artery is dominant. Possible stenosis at the right vertebral origin, which is not well evaluated due to artifact. IMPRESSION: Small acute to subacute infarcts involving right frontal lobe (precentral gyrus), left parietal lobe, and right occipital lobe. Stable chronic findings including chronic infarcts, chronic microvascular ischemic changes, and chronic microhemorrhages and subarachnoid blood products in a pattern suggesting amyloid angiopathy. No large vessel occlusion. No hemodynamically significant stenosis at the ICA origins. Electronically Signed   By: Guadlupe SpanishPraneil  Patel M.D.   On: 11/24/2019 07:55   MR BRAIN WO CONTRAST  Result Date: 11/24/2019 CLINICAL DATA:  Left facial droop EXAM: MRI HEAD WITHOUT CONTRAST MRA HEAD WITHOUT CONTRAST MRA NECK WITHOUT AND WITH CONTRAST TECHNIQUE: Multiplanar, multiecho pulse sequences of the brain and surrounding structures were obtained without intravenous contrast. Angiographic images of the Circle of Willis were obtained using MRA technique without intravenous contrast. Angiographic images of the neck were obtained using MRA technique without and with intravenous contrast. Carotid stenosis measurements (when applicable) are obtained utilizing NASCET criteria, using the distal internal carotid diameter as the denominator. CONTRAST:  8mL GADAVIST GADOBUTROL 1 MMOL/ML IV SOLN COMPARISON:  MRI brain 07/20/2019 FINDINGS: MRI HEAD  Brain: There is a small focus diffusion hyperintensity and ADC isointensity involving the right precentral gyrus hand motor region. Additional small focus of reduced diffusion in the parasagittal inferior left parietal lobe. Possible additional small focus of reduced diffusion in the right occipital lobe. Chronic left parietooccipital infarct again seen with associated chronic blood products. Right anterior temporal encephalomalacia is also again identified. Additional patchy and confluent areas of T2 hyperintensity in the supratentorial white matter nonspecific but probably reflect stable chronic microvascular ischemic changes. Prominence of the ventricles and sulci reflects stable volume loss. There is evidence of prior subarachnoid hemorrhage on susceptibility weighted imaging. Numerous small foci of susceptibility are present particularly involving the posterior cerebrum likely reflecting chronic microhemorrhages. There is no intracranial mass or mass effect. Vascular: Major vessel flow voids at the skull base are preserved. Skull and upper cervical spine: Normal marrow signal is preserved. Sinuses/Orbits: Mild mucosal thickening. Bilateral lens replacements. Other: Sella is unremarkable.  Mastoid air cells are clear. MRA HEAD Intracranial internal carotid arteries are patent with atherosclerotic irregularity left greater than right. Anterior cerebral arteries are patent. Right A1 ACA is dominant. 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 Middle cerebral arteries are patent. Intracranial left vertebral artery is patent. The right vertebral artery is patent on postcontrast MRA neck. May terminate as a PICA. Basilar artery is patent. Posterior cerebral  arteries are patent with atherosclerotic irregularity there is no anteriorly directed aneurysm left posteriorly from the anterior genu posterior. MRA NECK Common, internal, and external carotid arteries are patent. There is no hemodynamically significant stenosis  at the ICA origins. Extracranial vertebral arteries are patent. Left vertebral artery is dominant. Possible stenosis at the right vertebral origin, which is not well evaluated due to artifact. IMPRESSION: Small acute to subacute infarcts involving right frontal lobe (precentral gyrus), left parietal lobe, and right occipital lobe. Stable chronic findings including chronic infarcts, chronic microvascular ischemic changes, and chronic microhemorrhages and subarachnoid blood products in a pattern suggesting amyloid angiopathy. No large vessel occlusion. No hemodynamically significant stenosis at the ICA origins. Electronically Signed   By: Guadlupe Spanish M.D.   On: 11/24/2019 07:55   ECHOCARDIOGRAM COMPLETE  Result Date: 11/24/2019    ECHOCARDIOGRAM REPORT   Patient Name:   LAQUANA VILLARI Date of Exam: 11/24/2019 Medical Rec #:  [id number]       Height:       63.0 in Accession #:    [id number]      Weight:       177.0 lb Date of Birth:  1932-02-25       BSA:          1.836 m Patient Age:    86 years        BP:           171/85 mmHg Patient Gender: F               HR:           88 bpm. Exam Location:  Inpatient Procedure: 2D Echo Indications:    Stroke I163.9  History:        Patient has prior history of Echocardiogram examinations, most                 recent 06/01/2019. TIA; Risk Factors:Hypertension, Dyslipidemia                 and Diabetes.  Sonographer:    Thurman Coyer RDCS (AE) Referring Phys: [id number] JARED E SEGAL IMPRESSIONS  1. Left ventricular ejection fraction, by estimation, is 60 to 65%. The left ventricle has normal function. The left ventricle has no regional wall motion abnormalities. There is mild concentric left ventricular hypertrophy. Left ventricular diastolic parameters are consistent with Grade II diastolic dysfunction (pseudonormalization). Elevated left ventricular end-diastolic pressure.  2. Right ventricular systolic function is normal. The right ventricular size is normal.  3. Left  atrial size was moderately dilated.  4. The mitral valve is normal in structure. Trivial mitral valve regurgitation. Moderate to severe mitral annular calcification.  5. The aortic valve is tricuspid. There is mild calcification of the aortic valve. There is mild thickening of the aortic valve. Aortic valve regurgitation is not visualized. Mild aortic valve stenosis.  6. The inferior vena cava is normal in size with greater than 50% respiratory variability, suggesting right atrial pressure of 3 mmHg. Comparison(s): No significant change from prior study. Conclusion(s)/Recommendation(s): No intracardiac source of embolism detected on this transthoracic study. A transesophageal echocardiogram is recommended to exclude cardiac source of embolism if clinically indicated. FINDINGS  Left Ventricle: Left ventricular ejection fraction, by estimation, is 60 to 65%. The left ventricle has normal function. The left ventricle has no regional wall motion abnormalities. The left ventricular internal cavity size was normal in size. There is  mild concentric left ventricular hypertrophy. Left ventricular diastolic parameters are consistent with  Grade II diastolic dysfunction (pseudonormalization). Elevated left ventricular end-diastolic pressure. Right Ventricle: The right ventricular size is normal. No increase in right ventricular wall thickness. Right ventricular systolic function is normal. Left Atrium: Left atrial size was moderately dilated. Right Atrium: Right atrial size was normal in size. Pericardium: Trivial pericardial effusion is present. Presence of pericardial fat pad. Mitral Valve: The mitral valve is normal in structure. There is mild thickening of the mitral valve leaflet(s). Moderate to severe mitral annular calcification. Trivial mitral valve regurgitation. Tricuspid Valve: The tricuspid valve is normal in structure. Tricuspid valve regurgitation is trivial. Aortic Valve: The aortic valve is tricuspid. There is  mild calcification of the aortic valve. There is mild thickening of the aortic valve. Aortic valve regurgitation is not visualized. Mild aortic stenosis is present. Aortic valve mean gradient measures  10.0 mmHg. Aortic valve peak gradient measures 18.4 mmHg. Aortic valve area, by VTI measures 1.92 cm. Pulmonic Valve: The pulmonic valve was not well visualized. Pulmonic valve regurgitation is not visualized. Aorta: The aortic root is normal in size and structure. Venous: The inferior vena cava is normal in size with greater than 50% respiratory variability, suggesting right atrial pressure of 3 mmHg. IAS/Shunts: The atrial septum is grossly normal.  LEFT VENTRICLE PLAX 2D LVIDd:         3.70 cm  Diastology LVIDs:         2.50 cm  LV e' medial:    3.97 cm/s LV PW:         1.30 cm  LV E/e' medial:  29.2 LV IVS:        1.30 cm  LV e' lateral:   4.30 cm/s LVOT diam:     2.00 cm  LV E/e' lateral: 27.0 LV SV:         86 LV SV Index:   47 LVOT Area:     3.14 cm  RIGHT VENTRICLE RV S prime:     16.40 cm/s TAPSE (M-mode): 1.8 cm LEFT ATRIUM             Index       RIGHT ATRIUM          Index LA diam:        3.70 cm 2.02 cm/m  RA Area:     9.56 cm LA Vol (A2C):   68.3 ml 37.20 ml/m RA Volume:   14.80 ml 8.06 ml/m LA Vol (A4C):   62.0 ml 33.77 ml/m LA Biplane Vol: 69.2 ml 37.69 ml/m  AORTIC VALVE AV Area (Vmax):    2.11 cm AV Area (Vmean):   1.73 cm AV Area (VTI):     1.92 cm AV Vmax:           214.75 cm/s AV Vmean:          151.250 cm/s AV VTI:            0.450 m AV Peak Grad:      18.4 mmHg AV Mean Grad:      10.0 mmHg LVOT Vmax:         144.00 cm/s LVOT Vmean:        83.500 cm/s LVOT VTI:          0.275 m LVOT/AV VTI ratio: 0.61  AORTA Ao Root diam: 3.30 cm MITRAL VALVE MV Area (PHT): 2.99 cm     SHUNTS MV Decel Time: 254 msec     Systemic VTI:  0.28 m MV E velocity: 116.00 cm/s  Systemic Diam:  2.00 cm MV A velocity: 144.00 cm/s MV E/A ratio:  0.81 Jodelle Red MD Electronically signed by Jodelle Red MD Signature Date/Time: 11/24/2019/3:03:21 PM    Final         Scheduled Meds: .  stroke: mapping our early stages of recovery book   Does not apply Once  . clopidogrel  75 mg Oral Q breakfast  . DULoxetine  30 mg Oral Daily  . enoxaparin (LOVENOX) injection  40 mg Subcutaneous Q24H  . insulin aspart  0-9 Units Subcutaneous TID WC  . insulin aspart  3 Units Subcutaneous TID WC  . insulin glargine  10 Units Subcutaneous QHS  . levothyroxine  100 mcg Oral Daily  . propranolol  40 mg Oral Daily  . traZODone  50 mg Oral QHS   Continuous Infusions: . sodium chloride Stopped (11/25/19 0300)     LOS: 1 day   Time spent:  Azucena Fallen, DO [facility]  If 7PM-7AM, please contact night-coverage [email or web address]  11/25/2019, 7:27 AM

## 2019-11-26 DIAGNOSIS — I10 Essential (primary) hypertension: Secondary | ICD-10-CM

## 2019-11-26 LAB — CBC
HCT: 39 % (ref 36.0–46.0)
Hemoglobin: 12.5 g/dL (ref 12.0–15.0)
MCH: 32 pg (ref 26.0–34.0)
MCHC: 32.1 g/dL (ref 30.0–36.0)
MCV: 99.7 fL (ref 80.0–100.0)
Platelets: 221 10*3/uL (ref 150–400)
RBC: 3.91 MIL/uL (ref 3.87–5.11)
RDW: 13 % (ref 11.5–15.5)
WBC: 4 10*3/uL (ref 4.0–10.5)
nRBC: 0 % (ref 0.0–0.2)

## 2019-11-26 LAB — BASIC METABOLIC PANEL
Anion gap: 9 (ref 5–15)
BUN: 14 mg/dL (ref 8–23)
CO2: 28 mmol/L (ref 22–32)
Calcium: 9.2 mg/dL (ref 8.9–10.3)
Chloride: 104 mmol/L (ref 98–111)
Creatinine, Ser: 1.27 mg/dL — ABNORMAL HIGH (ref 0.44–1.00)
GFR, Estimated: 42 mL/min — ABNORMAL LOW (ref >60)
Glucose, Bld: 144 mg/dL — ABNORMAL HIGH (ref 70–99)
Potassium: 3.6 mmol/L (ref 3.5–5.1)
Sodium: 141 mmol/L (ref 135–145)

## 2019-11-26 LAB — GLUCOSE, CAPILLARY
Glucose-Capillary: 121 mg/dL — ABNORMAL HIGH (ref 70–99)
Glucose-Capillary: 203 mg/dL — ABNORMAL HIGH (ref 70–99)
Glucose-Capillary: 184 mg/dL — ABNORMAL HIGH (ref 70–99)

## 2019-11-26 MED ORDER — ROSUVASTATIN CALCIUM 5 MG PO TABS: 5.0000 mg | ORAL_TABLET | ORAL | 0 refills | Status: DC

## 2019-11-26 NOTE — Progress Notes (Signed)
Occupational Therapy Treatment Patient Details Name: Hannah Fischer MRN: [id number] DOB: Aug 21, 1932 Today's Date: 11/26/2019    History of present illness 86 year old female with a history of prior CVA without residual deficit, TIA, DM, PE, anemia, anxiety, hypertension, hyperlipidemia who presented to the ED on 10/19 with concern for strokelike symptoms of LUE paresthesias and weakness since 10 am on 10/18 as well as LLE heaviness and tongue numbness.   MRI + small acute to subacute infarcts involving R frontal, L parietal and R occipital lobes.  PMH: anxiety, arthritis, chronic pain syndrome, DM, HTN, neuropathy, TIA, back surgery; L Reverse TSA 10/15; R hemi-arthropplasty R   OT comments  Pt. Seen for skilled OT treatment session. Husband present and active in session.  Fine motor handouts provided along with review and return demo from patient.  Provided examples of how to integrate functional use of L hand to continue with strengthening.  Also reviewed fall prevention handouts with pt. And husband. Eager for d/c home likely later today.   Follow Up Recommendations  Outpatient OT;Supervision - Intermittent    Equipment Recommendations  None recommended by OT    Recommendations for Other Services      Precautions / Restrictions Precautions Precautions: Fall Restrictions Weight Bearing Restrictions: No       Mobility Bed Mobility Overal bed mobility: Modified Independent             General bed mobility comments: increased time  Transfers Overall transfer level: Needs assistance Equipment used: None;Straight cane Transfers: Sit to/from Stand Sit to Stand: Supervision              Balance Overall balance assessment: Needs assistance Sitting-balance support: No upper extremity supported;Feet supported Sitting balance-Leahy Scale: Good     Standing balance support: No upper extremity supported;During functional activity Standing balance-Leahy Scale: Good                              ADL either performed or assessed with clinical judgement   ADL                                               Vision       Perception     Praxis      Cognition Arousal/Alertness: Awake/alert Behavior During Therapy: WFL for tasks assessed/performed Overall Cognitive Status: Within Functional Limits for tasks assessed                                          Exercises Hand Exercises Digit Composite Flexion: AROM;Left;10 reps;Supine Composite Extension: AROM;Left;10 reps;Supine Digit Composite Abduction: AROM;Left;10 reps Digit Composite Adduction: Left;AROM;10 reps Digit Lifts: AROM;Left;10 reps Thumb Abduction: AROM;Left;10 reps Thumb Adduction: AROM;Left;10 reps Opposition: AROM;Left;10 reps Other Exercises Other Exercises: provided fine motor handouts and reviewed at length examples to encorporate functional use ie: turning pages, cards, remote control, phone, opening containers Other Exercises: provided fall prevention handouts and reviewed with pt. and husband who was present   Shoulder Instructions       General Comments VSS on RA    Pertinent Vitals/ Pain       Pain Assessment: No/denies pain  Home Living  Prior Functioning/Environment              Frequency  Min 2X/week        Progress Toward Goals  OT Goals(current goals can now be found in the care plan section)  Progress towards OT goals: Progressing toward goals  Acute Rehab OT Goals Patient Stated Goal: To go home  Plan      Co-evaluation                 AM-PAC OT "6 Clicks" Daily Activity     Outcome Measure   Help from another person eating meals?: None Help from another person taking care of personal grooming?: A Little Help from another person toileting, which includes using toliet, bedpan, or urinal?: A Little Help from another person  bathing (including washing, rinsing, drying)?: A Little Help from another person to put on and taking off regular upper body clothing?: A Little Help from another person to put on and taking off regular lower body clothing?: A Little 6 Click Score: 19    End of Session    OT Visit Diagnosis: Unsteadiness on feet (R26.81);Muscle weakness (generalized) (M62.81);Other symptoms and signs involving cognitive function;Low vision, both eyes (H54.2)   Activity Tolerance Patient tolerated treatment well   Patient Left with call bell/phone within reach;with family/visitor present;in bed   Nurse Communication          Time: 4332-9518 OT Time Calculation (min): 24 min  Charges: OT General Charges $OT Visit: 1 Visit OT Treatments $Therapeutic Exercise: 23-37 mins  Hannah Fischer, [facility] Rehabilitation [phone]   Robet Leu 11/26/2019, 12:01 PM

## 2019-11-26 NOTE — Progress Notes (Signed)
Physical Therapy Treatment Patient Details Name: Hannah Fischer MRN: [id number] DOB: 22-Sep-1932 Today's Date: 11/26/2019    History of Present Illness 86 year old female with a history of prior CVA without residual deficit, TIA, DM, PE, anemia, anxiety, hypertension, hyperlipidemia who presented to the ED on 10/19 with concern for strokelike symptoms of LUE paresthesias and weakness since 10 am on 10/18 as well as LLE heaviness and tongue numbness.   MRI + small acute to subacute infarcts involving R frontal, L parietal and R occipital lobes.  PMH: anxiety, arthritis, chronic pain syndrome, DM, HTN, neuropathy, TIA, back surgery; L Reverse TSA 10/15; R hemi-arthropplasty R    PT Comments    Pt tolerates treatment well, demonstrating increased ambulation tolerance and some improvement in stability with UE support of cane. Pt does continue to report impaired coordination of L hand, but otherwise feels close to her baseline. Pt will continue to benefit from PT services to improve dynamic balance and gait. PT continues to recommend discharge home with outpatient PT.   Follow Up Recommendations  Supervision for mobility/OOB;Outpatient PT     Equipment Recommendations  None recommended by PT    Recommendations for Other Services       Precautions / Restrictions Precautions Precautions: Fall Restrictions Weight Bearing Restrictions: No    Mobility  Bed Mobility Overal bed mobility: Modified Independent             General bed mobility comments: increased time  Transfers Overall transfer level: Needs assistance Equipment used: None;Straight cane Transfers: Sit to/from Stand Sit to Stand: Supervision            Ambulation/Gait Ambulation/Gait assistance: Supervision Gait Distance (Feet): 250 Feet Assistive device: Straight cane Gait Pattern/deviations: Step-through pattern Gait velocity: reduced Gait velocity interpretation: <1.8 ft/sec, indicate of risk for recurrent  falls General Gait Details: pt with step through gait with increased lateral sway, no significant losses of balance noted   Stairs Stairs: Yes Stairs assistance: Supervision Stair Management: One rail Left;With cane Number of Stairs: 2     Wheelchair Mobility    Modified Rankin (Stroke Patients Only) Modified Rankin (Stroke Patients Only) Pre-Morbid Rankin Score: No symptoms Modified Rankin: Moderately severe disability     Balance Overall balance assessment: Needs assistance Sitting-balance support: No upper extremity supported;Feet supported Sitting balance-Leahy Scale: Good     Standing balance support: No upper extremity supported;During functional activity Standing balance-Leahy Scale: Good                              Cognition Arousal/Alertness: Awake/alert Behavior During Therapy: WFL for tasks assessed/performed Overall Cognitive Status: Within Functional Limits for tasks assessed                                        Exercises      General Comments General comments (skin integrity, edema, etc.): VSS on RA      Pertinent Vitals/Pain Pain Assessment: No/denies pain    Home Living                      Prior Function            PT Goals (current goals can now be found in the care plan section) Acute Rehab PT Goals Patient Stated Goal: To go home Progress towards PT goals: Progressing toward  goals    Frequency    Min 4X/week      PT Plan Current plan remains appropriate    Co-evaluation              AM-PAC PT "6 Clicks" Mobility   Outcome Measure  Help needed turning from your back to your side while in a flat bed without using bedrails?: None Help needed moving from lying on your back to sitting on the side of a flat bed without using bedrails?: None Help needed moving to and from a bed to a chair (including a wheelchair)?: None Help needed standing up from a chair using your arms (e.g.,  wheelchair or bedside chair)?: None Help needed to walk in hospital room?: None Help needed climbing 3-5 steps with a railing? : None 6 Click Score: 24    End of Session   Activity Tolerance: Patient tolerated treatment well Patient left: in chair;with call bell/phone within reach;with chair alarm set Nurse Communication: Mobility status PT Visit Diagnosis: Unsteadiness on feet (R26.81);Other symptoms and signs involving the nervous system (J88.325)     Time: 4982-6415 PT Time Calculation (min) (ACUTE ONLY): 19 min  Charges:  $Gait Training: 8-22 mins                     Arlyss Gandy, PT, DPT Acute Rehabilitation Pager: [phone]    Arlyss Gandy 11/26/2019, 9:47 AM

## 2019-11-26 NOTE — Progress Notes (Signed)
Patient has been discharged from the unit via wheelchair. Tele and IV has been disconnected. Pt has been given and reviewed the AVS. All belongings was sent with the patient. Husband is at bedside. Pt reports no pain and is sent in good spirits.

## 2019-11-26 NOTE — Discharge Summary (Signed)
Physician Discharge Summary  Hannah Fischer ZOX:[id number] DOB: August 21, 1932 DOA: 11/23/2019  PCP: Sigmund Hazel, MD  Admit date: 11/23/2019 Discharge date: 11/26/2019  Admitted From: Home Disposition: Home  Recommendations for Outpatient Follow-up:  1. Follow up with PCP in 1-2 weeks  Home Health: None, outpatient PT Equipment/Devices: None  Discharge Condition: Stable CODE STATUS: Full Diet recommendation: Low-fat diabetic diet  Brief/Interim Summary: This isa very pleasant35 year old female with a history of prior CVA without residual deficit, TIA, DM, PE, anemia, anxiety, hypertension, hyperlipidemia who presented to the ED on 10/19 with concern for strokelike symptoms of LUE paresthesias and weakness since 10 am on 10/18 as well as LLE heaviness andtongue numbness. States her symptoms began while she was laying in bed. States that she continues to have similar symptoms without much improvement. She was recently seen for dizziness by her neurologist, Dr.Yan, on 9/21 who continued the patient's plavix due to history of strokes seen on MRI and risk factors.In ED: patient afebrile, hypertensive on room air. Notable labs: Glucose 328, BUN 25, Cr 1.29 (baseline 0.9), otherwise with pyuria and rare bacteria on UA. CT head with chronic left parietooccipital infarct. ED PA discussed with neurology who recommended an MRI/MRA which were most notable for: small acute to subacute infarcts involving the right frontal lobe, left parietallobe and right occipital lobe. Neurology contacted by ED staff prior to admission.  Patient admitted as above with acute onset paresthesias and weakness found to have right frontal lobe and left parietal lobe and right occipital lobe infarcts per imaging.  She was not a candidate for TPA due to timing, neurology following recommending Plavix alone given imaging remarkable for amyloid angiopathy, initially recommending follow-up with cardiology for possible cessation  of Plavix and further discussion with outpatient physician for further work-up and evaluation per neurology recommendations.  Patient reports intolerance to simvastatin, when we discussed this she reports it was notably weakness, given her high risk for recurrent stroke given 86 and comorbid conditions and comorbid conditions we did recommend at least twice weekly statin, would recommend to advance this to at least every other day if not daily pending tolerance.  Remainder of her work-up including echo was unremarkable.  Patient otherwise stable and agreeable for discharge home with close follow-up with PCP, neurology and cardiology in the near future as scheduled.  Discharge Diagnoses:  Principal Problem:   Stroke [facility]) Active Problems:   HTN (hypertension)   Diabetes mellitus (HCC)   Dyslipidemia   AKI (acute kidney injury) (HCC)   Cerebral amyloid angiopathy (CODE)   Discharge Instructions  Discharge Instructions    Ambulatory referral to Neurology   Complete by: As directed    Follow up with Dr. Terrace Arabia at [facility] in 4 weeks.  Patient is Dr. Zannie Cove patient. Thanks.   Ambulatory referral to Occupational Therapy   Complete by: As directed    Ambulatory referral to Physical Therapy   Complete by: As directed    Ambulatory referral to Speech Therapy   Complete by: As directed    Diet - low sodium heart healthy   Complete by: As directed    Increase activity slowly   Complete by: As directed      Allergies as of 11/26/2019      Reactions   Codeine Anxiety   Sulfites Other (See Comments)   Abdominal pain   Zocor [simvastatin] Other (See Comments)   Muscle weakness      Medication List    STOP taking these medications   ALPRAZolam 0.25  MG tablet Commonly known as: XANAX   cephALEXin 500 MG capsule Commonly known as: KEFLEX     TAKE these medications   clopidogrel 75 MG tablet Commonly known as: PLAVIX Take 1 tablet (75 mg total) by mouth daily with breakfast.   DULoxetine 30 MG capsule Commonly  known as: CYMBALTA TAKE 1 CAPSULE(30 MG) BY MOUTH DAILY What changed: See the new instructions.   Fish Oil 1000 MG Caps Take 1 capsule by mouth 2 (two) times daily.   levothyroxine 100 MCG tablet Commonly known as: SYNTHROID Take 1 tablet (100 mcg total) by mouth daily.   losartan 100 MG tablet Commonly known as: COZAAR Take 100 mg by mouth daily.   methocarbamol 500 MG tablet Commonly known as: Robaxin Take 1 tablet (500 mg total) by mouth every 8 (eight) hours as needed for muscle spasms.   pioglitazone 45 MG tablet Commonly known as: ACTOS Take 45 mg by mouth daily.   propranolol 40 MG tablet Commonly known as: INDERAL Take 40 mg by mouth daily.   rosuvastatin 5 MG tablet Commonly known as: Crestor Take 1 tablet (5 mg total) by mouth 2 (two) times a week for 30 doses. Start taking on: November 29, 2019   traZODone 50 MG tablet Commonly known as: DESYREL TAKE 1 TABLET BY MOUTH AT BEDTIME   VITAMIN B-12 PO Take 1 tablet by mouth daily.   Vitamin D3 50 MCG (2000 UT) Tabs Take 2,000 Units by mouth daily.       Follow-up Information    [facility].   Specialty: Rehabilitation Why: The outpatient rehab will contact you for the first appointment. Contact information: [address] [id number] [place] [address] [phone]       Levert Feinstein, MD. Schedule an appointment as soon as possible for a visit in 4 week(s).   Specialty: Neurology Contact information: [address] [place] [address] [phone]              Allergies  Allergen Reactions  . Codeine Anxiety  . Sulfites Other (See Comments)    Abdominal pain  . Zocor [Simvastatin] Other (See Comments)    Muscle weakness    Consultations:  Neurology   Procedures/Studies: CT Head Wo Contrast  Result Date: 11/23/2019 CLINICAL DATA:  Left-sided facial droop. EXAM: CT HEAD WITHOUT CONTRAST  TECHNIQUE: Contiguous axial images were obtained from the base of the skull through the vertex without intravenous contrast. COMPARISON:  November 15, 2011 FINDINGS: Brain: There is mild cerebral atrophy with widening of the extra-axial spaces and ventricular dilatation. There are areas of decreased attenuation within the white matter tracts of the supratentorial brain, consistent with microvascular disease changes. A small area of cortical encephalomalacia, with adjacent chronic white matter low attenuation, is seen within the left parietooccipital region. Vascular: No hyperdense vessel or unexpected calcification. Skull: Normal. Negative for fracture or focal lesion. Sinuses/Orbits: No acute finding. Other: None. IMPRESSION: 1. Generalized cerebral atrophy. 2. Small, chronic left parietooccipital infarct. 3. No acute intracranial abnormality. Electronically Signed   By: Aram Candela M.D.   On: 11/23/2019 23:24   MR ANGIO HEAD WO CONTRAST  Result Date: 11/24/2019 CLINICAL DATA:  Left facial droop EXAM: MRI HEAD WITHOUT CONTRAST MRA HEAD WITHOUT CONTRAST MRA NECK WITHOUT AND WITH CONTRAST TECHNIQUE: Multiplanar, multiecho pulse sequences of the brain and surrounding structures were obtained without intravenous contrast. Angiographic images of the Circle of Willis were obtained using MRA technique without intravenous  contrast. Angiographic images of the neck were obtained using MRA technique without and with intravenous contrast. Carotid stenosis measurements (when applicable) are obtained utilizing NASCET criteria, using the distal internal carotid diameter as the denominator. CONTRAST:  42mL GADAVIST GADOBUTROL 1 MMOL/ML IV SOLN COMPARISON:  MRI brain 07/20/2019 FINDINGS: MRI HEAD Brain: There is a small focus diffusion hyperintensity and ADC isointensity involving the right precentral gyrus hand motor region. Additional small focus of reduced diffusion in the parasagittal inferior left parietal lobe.  Possible additional small focus of reduced diffusion in the right occipital lobe. Chronic left parietooccipital infarct again seen with associated chronic blood products. Right anterior temporal encephalomalacia is also again identified. Additional patchy and confluent areas of T2 hyperintensity in the supratentorial white matter nonspecific but probably reflect stable chronic microvascular ischemic changes. Prominence of the ventricles and sulci reflects stable volume loss. There is evidence of prior subarachnoid hemorrhage on susceptibility weighted imaging. Numerous small foci of susceptibility are present particularly involving the posterior cerebrum likely reflecting chronic microhemorrhages. There is no intracranial mass or mass effect. Vascular: Major vessel flow voids at the skull base are preserved. Skull and upper cervical spine: Normal marrow signal is preserved. Sinuses/Orbits: Mild mucosal thickening. Bilateral lens replacements. Other: Sella is unremarkable.  Mastoid air cells are clear. MRA HEAD Intracranial internal carotid arteries are patent with atherosclerotic irregularity left greater than right. Anterior cerebral arteries are patent. Right A1 ACA is dominant. 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 Middle cerebral arteries are patent. Intracranial left vertebral artery is patent. The right vertebral artery is patent on postcontrast MRA neck. May terminate as a PICA. Basilar artery is patent. Posterior cerebral arteries are patent with atherosclerotic irregularity there is no anteriorly directed aneurysm left posteriorly from the anterior genu posterior. MRA NECK Common, internal, and external carotid arteries are patent. There is no hemodynamically significant stenosis at the ICA origins. Extracranial vertebral arteries are patent. Left vertebral artery is dominant. Possible stenosis at the right vertebral origin, which is not well evaluated due to artifact. IMPRESSION: Small acute to  subacute infarcts involving right frontal lobe (precentral gyrus), left parietal lobe, and right occipital lobe. Stable chronic findings including chronic infarcts, chronic microvascular ischemic changes, and chronic microhemorrhages and subarachnoid blood products in a pattern suggesting amyloid angiopathy. No large vessel occlusion. No hemodynamically significant stenosis at the ICA origins. Electronically Signed   By: Guadlupe Spanish M.D.   On: 11/24/2019 07:55   MR ANGIO NECK W WO CONTRAST  Result Date: 11/24/2019 CLINICAL DATA:  Left facial droop EXAM: MRI HEAD WITHOUT CONTRAST MRA HEAD WITHOUT CONTRAST MRA NECK WITHOUT AND WITH CONTRAST TECHNIQUE: Multiplanar, multiecho pulse sequences of the brain and surrounding structures were obtained without intravenous contrast. Angiographic images of the Circle of Willis were obtained using MRA technique without intravenous contrast. Angiographic images of the neck were obtained using MRA technique without and with intravenous contrast. Carotid stenosis measurements (when applicable) are obtained utilizing NASCET criteria, using the distal internal carotid diameter as the denominator. CONTRAST:  49mL GADAVIST GADOBUTROL 1 MMOL/ML IV SOLN COMPARISON:  MRI brain 07/20/2019 FINDINGS: MRI HEAD Brain: There is a small focus diffusion hyperintensity and ADC isointensity involving the right precentral gyrus hand motor region. Additional small focus of reduced diffusion in the parasagittal inferior left parietal lobe. Possible additional small focus of reduced diffusion in the right occipital lobe. Chronic left parietooccipital infarct again seen with associated chronic blood products. Right anterior temporal encephalomalacia is also again identified. Additional patchy and confluent  areas of T2 hyperintensity in the supratentorial white matter nonspecific but probably reflect stable chronic microvascular ischemic changes. Prominence of the ventricles and sulci reflects  stable volume loss. There is evidence of prior subarachnoid hemorrhage on susceptibility weighted imaging. Numerous small foci of susceptibility are present particularly involving the posterior cerebrum likely reflecting chronic microhemorrhages. There is no intracranial mass or mass effect. Vascular: Major vessel flow voids at the skull base are preserved. Skull and upper cervical spine: Normal marrow signal is preserved. Sinuses/Orbits: Mild mucosal thickening. Bilateral lens replacements. Other: Sella is unremarkable.  Mastoid air cells are clear. MRA HEAD Intracranial internal carotid arteries are patent with atherosclerotic irregularity left greater than right. Anterior cerebral arteries are patent. Right A1 ACA is dominant. 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 Middle cerebral arteries are patent. Intracranial left vertebral artery is patent. The right vertebral artery is patent on postcontrast MRA neck. May terminate as a PICA. Basilar artery is patent. Posterior cerebral arteries are patent with atherosclerotic irregularity there is no anteriorly directed aneurysm left posteriorly from the anterior genu posterior. MRA NECK Common, internal, and external carotid arteries are patent. There is no hemodynamically significant stenosis at the ICA origins. Extracranial vertebral arteries are patent. Left vertebral artery is dominant. Possible stenosis at the right vertebral origin, which is not well evaluated due to artifact. IMPRESSION: Small acute to subacute infarcts involving right frontal lobe (precentral gyrus), left parietal lobe, and right occipital lobe. Stable chronic findings including chronic infarcts, chronic microvascular ischemic changes, and chronic microhemorrhages and subarachnoid blood products in a pattern suggesting amyloid angiopathy. No large vessel occlusion. No hemodynamically significant stenosis at the ICA origins. Electronically Signed   By: Guadlupe Spanish M.D.   On: 11/24/2019  07:55   MR BRAIN WO CONTRAST  Result Date: 11/24/2019 CLINICAL DATA:  Left facial droop EXAM: MRI HEAD WITHOUT CONTRAST MRA HEAD WITHOUT CONTRAST MRA NECK WITHOUT AND WITH CONTRAST TECHNIQUE: Multiplanar, multiecho pulse sequences of the brain and surrounding structures were obtained without intravenous contrast. Angiographic images of the Circle of Willis were obtained using MRA technique without intravenous contrast. Angiographic images of the neck were obtained using MRA technique without and with intravenous contrast. Carotid stenosis measurements (when applicable) are obtained utilizing NASCET criteria, using the distal internal carotid diameter as the denominator. CONTRAST:  8mL GADAVIST GADOBUTROL 1 MMOL/ML IV SOLN COMPARISON:  MRI brain 07/20/2019 FINDINGS: MRI HEAD Brain: There is a small focus diffusion hyperintensity and ADC isointensity involving the right precentral gyrus hand motor region. Additional small focus of reduced diffusion in the parasagittal inferior left parietal lobe. Possible additional small focus of reduced diffusion in the right occipital lobe. Chronic left parietooccipital infarct again seen with associated chronic blood products. Right anterior temporal encephalomalacia is also again identified. Additional patchy and confluent areas of T2 hyperintensity in the supratentorial white matter nonspecific but probably reflect stable chronic microvascular ischemic changes. Prominence of the ventricles and sulci reflects stable volume loss. There is evidence of prior subarachnoid hemorrhage on susceptibility weighted imaging. Numerous small foci of susceptibility are present particularly involving the posterior cerebrum likely reflecting chronic microhemorrhages. There is no intracranial mass or mass effect. Vascular: Major vessel flow voids at the skull base are preserved. Skull and upper cervical spine: Normal marrow signal is preserved. Sinuses/Orbits: Mild mucosal thickening.  Bilateral lens replacements. Other: Sella is unremarkable.  Mastoid air cells are clear. MRA HEAD Intracranial internal carotid arteries are patent with atherosclerotic irregularity left greater than right. Anterior cerebral arteries are  patent. Right A1 ACA is dominant. 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 Middle cerebral arteries are patent. Intracranial left vertebral artery is patent. The right vertebral artery is patent on postcontrast MRA neck. May terminate as a PICA. Basilar artery is patent. Posterior cerebral arteries are patent with atherosclerotic irregularity there is no anteriorly directed aneurysm left posteriorly from the anterior genu posterior. MRA NECK Common, internal, and external carotid arteries are patent. There is no hemodynamically significant stenosis at the ICA origins. Extracranial vertebral arteries are patent. Left vertebral artery is dominant. Possible stenosis at the right vertebral origin, which is not well evaluated due to artifact. IMPRESSION: Small acute to subacute infarcts involving right frontal lobe (precentral gyrus), left parietal lobe, and right occipital lobe. Stable chronic findings including chronic infarcts, chronic microvascular ischemic changes, and chronic microhemorrhages and subarachnoid blood products in a pattern suggesting amyloid angiopathy. No large vessel occlusion. No hemodynamically significant stenosis at the ICA origins. Electronically Signed   By: Guadlupe Spanish M.D.   On: 11/24/2019 07:55   ECHOCARDIOGRAM COMPLETE  Result Date: 11/24/2019    ECHOCARDIOGRAM REPORT   Patient Name:   Hannah Fischer Date of Exam: 11/24/2019 Medical Rec #:  [id number]       Height:       63.0 in Accession #:    [id number]      Weight:       177.0 lb Date of Birth:  January 28, 1933       BSA:          1.836 m Patient Age:    86 years        BP:           171/85 mmHg Patient Gender: F               HR:           88 bpm. Exam Location:  Inpatient Procedure: 2D Echo  Indications:    Stroke I163.9  History:        Patient has prior history of Echocardiogram examinations, most                 recent 06/01/2019. TIA; Risk Factors:Hypertension, Dyslipidemia                 and Diabetes.  Sonographer:    Thurman Coyer RDCS (AE) Referring Phys: [id number] JARED E SEGAL IMPRESSIONS  1. Left ventricular ejection fraction, by estimation, is 60 to 65%. The left ventricle has normal function. The left ventricle has no regional wall motion abnormalities. There is mild concentric left ventricular hypertrophy. Left ventricular diastolic parameters are consistent with Grade II diastolic dysfunction (pseudonormalization). Elevated left ventricular end-diastolic pressure.  2. Right ventricular systolic function is normal. The right ventricular size is normal.  3. Left atrial size was moderately dilated.  4. The mitral valve is normal in structure. Trivial mitral valve regurgitation. Moderate to severe mitral annular calcification.  5. The aortic valve is tricuspid. There is mild calcification of the aortic valve. There is mild thickening of the aortic valve. Aortic valve regurgitation is not visualized. Mild aortic valve stenosis.  6. The inferior vena cava is normal in size with greater than 50% respiratory variability, suggesting right atrial pressure of 3 mmHg. Comparison(s): No significant change from prior study. Conclusion(s)/Recommendation(s): No intracardiac source of embolism detected on this transthoracic study. A transesophageal echocardiogram is recommended to exclude cardiac source of embolism if clinically indicated. FINDINGS  Left Ventricle: Left ventricular ejection fraction, by estimation,  is 60 to 65%. The left ventricle has normal function. The left ventricle has no regional wall motion abnormalities. The left ventricular internal cavity size was normal in size. There is  mild concentric left ventricular hypertrophy. Left ventricular diastolic parameters are consistent with  Grade II diastolic dysfunction (pseudonormalization). Elevated left ventricular end-diastolic pressure. Right Ventricle: The right ventricular size is normal. No increase in right ventricular wall thickness. Right ventricular systolic function is normal. Left Atrium: Left atrial size was moderately dilated. Right Atrium: Right atrial size was normal in size. Pericardium: Trivial pericardial effusion is present. Presence of pericardial fat pad. Mitral Valve: The mitral valve is normal in structure. There is mild thickening of the mitral valve leaflet(s). Moderate to severe mitral annular calcification. Trivial mitral valve regurgitation. Tricuspid Valve: The tricuspid valve is normal in structure. Tricuspid valve regurgitation is trivial. Aortic Valve: The aortic valve is tricuspid. There is mild calcification of the aortic valve. There is mild thickening of the aortic valve. Aortic valve regurgitation is not visualized. Mild aortic stenosis is present. Aortic valve mean gradient measures  10.0 mmHg. Aortic valve peak gradient measures 18.4 mmHg. Aortic valve area, by VTI measures 1.92 cm. Pulmonic Valve: The pulmonic valve was not well visualized. Pulmonic valve regurgitation is not visualized. Aorta: The aortic root is normal in size and structure. Venous: The inferior vena cava is normal in size with greater than 50% respiratory variability, suggesting right atrial pressure of 3 mmHg. IAS/Shunts: The atrial septum is grossly normal.  LEFT VENTRICLE PLAX 2D LVIDd:         3.70 cm  Diastology LVIDs:         2.50 cm  LV e' medial:    3.97 cm/s LV PW:         1.30 cm  LV E/e' medial:  29.2 LV IVS:        1.30 cm  LV e' lateral:   4.30 cm/s LVOT diam:     2.00 cm  LV E/e' lateral: 27.0 LV SV:         86 LV SV Index:   47 LVOT Area:     3.14 cm  RIGHT VENTRICLE RV S prime:     16.40 cm/s TAPSE (M-mode): 1.8 cm LEFT ATRIUM             Index       RIGHT ATRIUM          Index LA diam:        3.70 cm 2.02 cm/m  RA Area:      9.56 cm LA Vol (A2C):   68.3 ml 37.20 ml/m RA Volume:   14.80 ml 8.06 ml/m LA Vol (A4C):   62.0 ml 33.77 ml/m LA Biplane Vol: 69.2 ml 37.69 ml/m  AORTIC VALVE AV Area (Vmax):    2.11 cm AV Area (Vmean):   1.73 cm AV Area (VTI):     1.92 cm AV Vmax:           214.75 cm/s AV Vmean:          151.250 cm/s AV VTI:            0.450 m AV Peak Grad:      18.4 mmHg AV Mean Grad:      10.0 mmHg LVOT Vmax:         144.00 cm/s LVOT Vmean:        83.500 cm/s LVOT VTI:          0.275 m  LVOT/AV VTI ratio: 0.61  AORTA Ao Root diam: 3.30 cm MITRAL VALVE MV Area (PHT): 2.99 cm     SHUNTS MV Decel Time: 254 msec     Systemic VTI:  0.28 m MV E velocity: 116.00 cm/s  Systemic Diam: 2.00 cm MV A velocity: 144.00 cm/s MV E/A ratio:  0.81 Jodelle Red MD Electronically signed by Jodelle Red MD Signature Date/Time: 11/24/2019/3:03:21 PM    Final      Subjective: No acute issues or events overnight, excited for discharge home otherwise denies nausea, vomiting, diarrhea, constipation, headache, fevers, chills.   Discharge Exam: Vitals:   11/26/19 1019 11/26/19 1023  BP: 139/80   Pulse:  88  Resp:    Temp:    SpO2:     Vitals:   11/26/19 0303 11/26/19 0911 11/26/19 1019 11/26/19 1023  BP: 138/60 (!) 155/72 139/80   Pulse: 61 69  88  Resp: 20 16    Temp: 98 F (36.7 C) 98 F (36.7 C)    TempSrc: Oral Oral    SpO2: 98% 98%    Weight:      Height:        General: Pt is alert, awake, not in acute distress Cardiovascular: RRR, S1/S2 +, no rubs, no gallops Respiratory: CTA bilaterally, no wheezing, no rhonchi Abdominal: Soft, NT, ND, bowel sounds + Extremities: no edema, no cyanosis    The results of significant diagnostics from this hospitalization (including imaging, microbiology, ancillary and laboratory) are listed below for reference.     Microbiology: Recent Results (from the past 240 hour(s))  Respiratory Panel by RT PCR (Flu A&B, Covid) - Nasopharyngeal Swab      Status: None   Collection Time: 11/24/19  8:31 AM   Specimen: Nasopharyngeal Swab  Result Value Ref Range Status   SARS Coronavirus 2 by RT PCR NEGATIVE NEGATIVE Final    Comment: (NOTE) SARS-CoV-2 target nucleic acids are NOT DETECTED.  The SARS-CoV-2 RNA is generally detectable in upper respiratoy specimens during the acute phase of infection. The lowest concentration of SARS-CoV-2 viral copies this assay can detect is 131 copies/mL. A negative result does not preclude SARS-Cov-2 infection and should not be used as the sole basis for treatment or other patient management decisions. A negative result may occur with  improper specimen collection/handling, submission of specimen other than nasopharyngeal swab, presence of viral mutation(s) within the areas targeted by this assay, and inadequate number of viral copies (<131 copies/mL). A negative result must be combined with clinical observations, patient history, and epidemiological information. The expected result is Negative.  Fact Sheet for Patients:  https://www.moore.com/  Fact Sheet for Healthcare Providers:  https://www.young.biz/  This test is no t yet approved or cleared by the [place] FDA and  has been authorized for detection and/or diagnosis of SARS-CoV-2 by FDA under an Emergency Use Authorization (EUA). This EUA will remain  in effect (meaning this test can be used) for the duration of the COVID-19 declaration under Section 564(b)(1) of the Act, 21 U.S.C. section 360bbb-3(b)(1), unless the authorization is terminated or revoked sooner.     Influenza A by PCR NEGATIVE NEGATIVE Final   Influenza B by PCR NEGATIVE NEGATIVE Final    Comment: (NOTE) The Xpert Xpress SARS-CoV-2/FLU/RSV assay is intended as an aid in  the diagnosis of influenza from Nasopharyngeal swab specimens and  should not be used as a sole basis for treatment. Nasal washings and  aspirates are  unacceptable for Xpert Xpress SARS-CoV-2/FLU/RSV  testing.  Fact Sheet for Patients: https://www.moore.com/  Fact Sheet for Healthcare Providers: https://www.young.biz/  This test is not yet approved or cleared by the [place] FDA and  has been authorized for detection and/or diagnosis of SARS-CoV-2 by  FDA under an Emergency Use Authorization (EUA). This EUA will remain  in effect (meaning this test can be used) for the duration of the  Covid-19 declaration under Section 564(b)(1) of the Act, 21  U.S.C. section 360bbb-3(b)(1), unless the authorization is  terminated or revoked. Performed at [facility], [address], [place], [place] [address]      Labs: BNP (last 3 results) No results for input(s): BNP in the last 8760 hours. Basic Metabolic Panel: Recent Labs  Lab 11/23/19 2304 11/23/19 2329 11/24/19 1200 11/26/19 0136  NA 137 140  --  141  K 4.2 4.0  --  3.6  CL 100 99  --  104  CO2 27  --   --  28  GLUCOSE 328* 326*  --  144*  BUN 25* 25*  --  14  CREATININE 1.29* 1.30* 1.09* 1.27*  CALCIUM 8.9  --   --  9.2   Liver Function Tests: Recent Labs  Lab 11/23/19 2304  AST 21  ALT 9  ALKPHOS 73  BILITOT 0.9  PROT 6.8  ALBUMIN 3.7   No results for input(s): LIPASE, AMYLASE in the last 168 hours. No results for input(s): AMMONIA in the last 168 hours. CBC: Recent Labs  Lab 11/23/19 2304 11/23/19 2329 11/26/19 0136  WBC 4.0  --  4.0  NEUTROABS 2.2  --   --   HGB 12.6 12.9 12.5  HCT 38.8 38.0 39.0  MCV 99.2  --  99.7  PLT 219  --  221   Cardiac Enzymes: No results for input(s): CKTOTAL, CKMB, CKMBINDEX, TROPONINI in the last 168 hours. BNP: Invalid input(s): POCBNP CBG: Recent Labs  Lab 11/25/19 1243 11/25/19 1727 11/25/19 2127 11/26/19 0627 11/26/19 1111  GLUCAP 141* 112* 219* 121* 203*   D-Dimer No results for input(s): DDIMER in the last 72 hours. Hgb A1c Recent Labs     11/23/19 2304  HGBA1C 7.5*   Lipid Profile Recent Labs    11/24/19 1400  CHOL 212*  HDL 58  LDLCALC 128*  TRIG 130  CHOLHDL 3.7   Thyroid function studies No results for input(s): TSH, T4TOTAL, T3FREE, THYROIDAB in the last 72 hours.  Invalid input(s): FREET3 Anemia work up No results for input(s): VITAMINB12, FOLATE, FERRITIN, TIBC, IRON, RETICCTPCT in the last 72 hours. Urinalysis    Component Value Date/Time   COLORURINE YELLOW 11/24/2019 0200   APPEARANCEUR HAZY (A) 11/24/2019 0200   LABSPEC 1.014 11/24/2019 0200   PHURINE 5.0 11/24/2019 0200   GLUCOSEU >=500 (A) 11/24/2019 0200   HGBUR NEGATIVE 11/24/2019 0200   BILIRUBINUR NEGATIVE 11/24/2019 0200   KETONESUR NEGATIVE 11/24/2019 0200   PROTEINUR NEGATIVE 11/24/2019 0200   UROBILINOGEN 0.2 11/22/2013 0945   NITRITE NEGATIVE 11/24/2019 0200   LEUKOCYTESUR MODERATE (A) 11/24/2019 0200   Sepsis Labs Invalid input(s): PROCALCITONIN,  WBC,  LACTICIDVEN Microbiology Recent Results (from the past 240 hour(s))  Respiratory Panel by RT PCR (Flu A&B, Covid) - Nasopharyngeal Swab     Status: None   Collection Time: 11/24/19  8:31 AM   Specimen: Nasopharyngeal Swab  Result Value Ref Range Status   SARS Coronavirus 2 by RT PCR NEGATIVE NEGATIVE Final    Comment: (NOTE) SARS-CoV-2 target nucleic acids are NOT DETECTED.  The SARS-CoV-2 RNA is generally detectable in upper respiratoy specimens during the acute phase of infection. The lowest concentration of SARS-CoV-2 viral copies this assay can detect is 131 copies/mL. A negative result does not preclude SARS-Cov-2 infection and should not be used as the sole basis for treatment or other patient management decisions. A negative result may occur with  improper specimen collection/handling, submission of specimen other than nasopharyngeal swab, presence of viral mutation(s) within the areas targeted by this assay, and inadequate number of viral copies (<131 copies/mL). A  negative result must be combined with clinical observations, patient history, and epidemiological information. The expected result is Negative.  Fact Sheet for Patients:  https://www.moore.com/  Fact Sheet for Healthcare Providers:  https://www.young.biz/  This test is no t yet approved or cleared by the [place] FDA and  has been authorized for detection and/or diagnosis of SARS-CoV-2 by FDA under an Emergency Use Authorization (EUA). This EUA will remain  in effect (meaning this test can be used) for the duration of the COVID-19 declaration under Section 564(b)(1) of the Act, 21 U.S.C. section 360bbb-3(b)(1), unless the authorization is terminated or revoked sooner.     Influenza A by PCR NEGATIVE NEGATIVE Final   Influenza B by PCR NEGATIVE NEGATIVE Final    Comment: (NOTE) The Xpert Xpress SARS-CoV-2/FLU/RSV assay is intended as an aid in  the diagnosis of influenza from Nasopharyngeal swab specimens and  should not be used as a sole basis for treatment. Nasal washings and  aspirates are unacceptable for Xpert Xpress SARS-CoV-2/FLU/RSV  testing.  Fact Sheet for Patients: https://www.moore.com/  Fact Sheet for Healthcare Providers: https://www.young.biz/  This test is not yet approved or cleared by the [place] FDA and  has been authorized for detection and/or diagnosis of SARS-CoV-2 by  FDA under an Emergency Use Authorization (EUA). This EUA will remain  in effect (meaning this test can be used) for the duration of the  Covid-19 declaration under Section 564(b)(1) of the Act, 21  U.S.C. section 360bbb-3(b)(1), unless the authorization is  terminated or revoked. Performed at [facility], [address] [place], [place] [address]      Time coordinating discharge: Over 30 minutes  SIGNED:   Azucena Fallen, DO [facility] 11/26/2019, 12:29  PM Pager   If 7PM-7AM, please contact night-coverage [email or web address]

## 2019-11-26 NOTE — TOC Transition Note (Signed)
Transition of Care [facility]) - CM/SW Discharge Note   Patient Details  Name: Hannah Fischer MRN: [id number] Date of Birth: [birth year]  Transition of Care [facility]) CM/SW Contact:  Kermit Balo, RN Phone Number: 11/26/2019, 10:25 AM   Clinical Narrative:    Pt is discharging home with outpatient therapy and her spouse. Information for Neuro rehab on her AVS. Pt has supervision at home and transportation to home.   Final next level of care: OP Rehab Barriers to Discharge: No Barriers Identified   Patient Goals and CMS Choice     Choice offered to / list presented to : Patient, Spouse  Discharge Placement                       Discharge Plan and Services   Discharge Planning Services: CM Consult                                 Social Determinants of Health (SDOH) Interventions     Readmission Risk Interventions No flowsheet data found.

## 2019-12-06 DIAGNOSIS — I10 Essential (primary) hypertension: Secondary | ICD-10-CM | POA: Diagnosis not present

## 2019-12-06 DIAGNOSIS — I6930 Unspecified sequelae of cerebral infarction: Secondary | ICD-10-CM | POA: Diagnosis not present

## 2019-12-06 DIAGNOSIS — Z6833 Body mass index (BMI) 33.0-33.9, adult: Secondary | ICD-10-CM | POA: Diagnosis not present

## 2020-01-05 ENCOUNTER — Ambulatory Visit: Payer: Medicare Other | Admitting: Occupational Therapy

## 2020-01-05 ENCOUNTER — Ambulatory Visit: Payer: Medicare Other | Attending: Internal Medicine

## 2020-01-05 ENCOUNTER — Other Ambulatory Visit: Payer: Self-pay

## 2020-01-05 ENCOUNTER — Encounter: Payer: Self-pay | Admitting: Physical Therapy

## 2020-01-05 ENCOUNTER — Ambulatory Visit: Payer: Medicare Other | Admitting: Physical Therapy

## 2020-01-05 VITALS — BP 162/69 | HR 44

## 2020-01-05 DIAGNOSIS — I69318 Other symptoms and signs involving cognitive functions following cerebral infarction: Secondary | ICD-10-CM | POA: Diagnosis not present

## 2020-01-05 DIAGNOSIS — R2689 Other abnormalities of gait and mobility: Secondary | ICD-10-CM | POA: Insufficient documentation

## 2020-01-05 DIAGNOSIS — R41841 Cognitive communication deficit: Secondary | ICD-10-CM

## 2020-01-05 DIAGNOSIS — M6281 Muscle weakness (generalized): Secondary | ICD-10-CM | POA: Diagnosis not present

## 2020-01-05 DIAGNOSIS — R278 Other lack of coordination: Secondary | ICD-10-CM | POA: Diagnosis not present

## 2020-01-05 DIAGNOSIS — R42 Dizziness and giddiness: Secondary | ICD-10-CM | POA: Diagnosis not present

## 2020-01-05 DIAGNOSIS — R2681 Unsteadiness on feet: Secondary | ICD-10-CM | POA: Diagnosis not present

## 2020-01-05 NOTE — Therapy (Addendum)
[facility] [address] [place], [place], [address] Phone: [phone]   Fax:  [phone]  Physical Therapy Evaluation  Patient Details  Name: Hannah Fischer G Parrella MRN: [id number] Date of Birth: 11/07/1932 Referring Provider (PT): Azucena FallenLancaster, William C, MD (will be followed by Dr. Terrace ArabiaYan)   Encounter Date: 01/05/2020   PT End of Session - 01/05/20 1645    Visit Number 1    Number of Visits 13    Date for PT Re-Evaluation 04/04/20   written for 6 week POC   Authorization Type BCBS Medicare    PT Start Time 1017    PT Stop Time 1102    PT Time Calculation (min) 45 min    Equipment Utilized During Treatment Gait belt    Activity Tolerance Patient tolerated treatment well    Behavior During Therapy [facility] 2WFL for tasks assessed/performed           Past Medical History:  Diagnosis Date  . Abnormal nuclear stress test    2005, normal cath Dr. Elease HashimotoNahser, normal stress test 2012 Dr. Eldridge DaceVaranasi  . Anemia    chronic  . Anxiety   . Arthritis    djd  . Arthritis of shoulder region, left, degenerative 12/03/2013  . Blood transfusion 1979   for anemia  . Chest pain    eval[facility]; had a stress test 2012  . Chronic pain syndrome 04/29/2019  . Chronic right-sided low back pain without sciatica 04/12/2019  . Complication of anesthesia    unable to breathe lying  flat on back  . Diabetes mellitus    type 2  niddm x 25 yrs  . Diabetes mellitus (HCC) 11/15/2011  . Diverticular disease   . DJD (degenerative joint disease)    lumbar and cervical  . GERD (gastroesophageal reflux disease)   . Hyperlipemia   . Hypertension    on medication  . Hypothyroidism    takes levoxyl  . Irritable bowel syndrome   . 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 Neuromuscular disorder (HCC)   . Neuropathy in diabetes (HCC)   . Nonspecific abnormal electrocardiogram (ECG) (EKG) 10/15/2013  . Other and unspecified angina pectoris     tests came back NOT heart related  . Pulmonary embolism (HCC) 06-2010   bilateral  . Pyuria 11/15/2011  . Seizures (HCC)    once, age 86  . TIA (transient ischemic attack) 11/15/2011    Past Surgical History:  Procedure Laterality Date  . BACK SURGERY    . BLEPHAROPLASTY  2012   rt eye  . CARDIAC CATHETERIZATION     had one 10/2013  . CHOLECYSTECTOMY    . EYE SURGERY  2007   cataract ext/ iol rt eye  . JOINT REPLACEMENT  2001,2005   both knees  . LEFT HEART CATHETERIZATION WITH CORONARY ANGIOGRAM N/A 10/18/2013   Procedure: LEFT HEART CATHETERIZATION WITH CORONARY ANGIOGRAM;  Surgeon: Corky CraftsJayadeep S Varanasi, MD;  Location[facility];  Service: Cardiovascular;  Laterality: N/A;  . LUMBAR DISC SURGERY  1990  . REVERSE SHOULDER ARTHROPLASTY Left 12/03/2013   Procedure: LEFT SHOULDER REVERSE ARTHROPLASTY;  Surgeon: Verlee RossettiSteven R Norris, MD;  [facility];  Service: Orthopedics;  Laterality: Left;  . SHOULDER HEMI-ARTHROPLASTY  12/25/2010   Procedure: SHOULDER HEMI-ARTHROPLASTY;  Surgeon: Cammy CopaGregory Scott Dean;  [facility];  Service: Orthopedics;  Laterality: Right;    Vitals:   01/05/20 1041  BP: (!) 162/69  Pulse: (!) 44  Subjective Assessment - 01/05/20 1020    Subjective Presented to the ED on 10/19 with concern for strokelike symptoms of LUE paresthesias and weakness as well as LLE heaviness and tongue numbness.   MRI + small acute to subacute infarcts involving R frontal, L parietal and R occipital lobes. Discharged home on 11/26/19. Using the [facility] for going outside, uses no AD in the house. Reports head feels swimmyheaded all the time (feels like her head is flying around) and the L hand doesn't work like it should. Reports walking and balance feels pretty good. No falls since leaving the hospital.    Patient is accompained by: Family member   husband   Pertinent History prior CVA without residual deficit, TIA, DM, PE, anemia, anxiety, hypertension, hyperlipidemia     Diagnostic tests MRI + small acute to subacute infarcts involving R frontal, L parietal and R occipital lobes.    Patient Stated Goals wants her L hand to work better    Currently in Pain? No/denies              [facility] PT Assessment - 01/05/20 1024      Assessment   Medical Diagnosis CVA    Referring Provider (PT) Azucena Fallen, MD   will be followed by Dr. Terrace Arabia   Onset Date/Surgical Date 11/23/19    Hand Dominance Right    Prior Therapy previous PT for feeling "swimmyheaded" and for B knee replacements and B shoulders      Precautions   Precautions Fall      Balance Screen   Has the patient fallen in the past 6 months No    Has the patient had a decrease in activity level because of a fear of falling?  No    Is the patient reluctant to leave their home because of a fear of falling?  No      Home [employer] information centre manager residence    Living Arrangements Spouse/significant other;Other (Comment)   and great granddaughter   Type of Home House    Home Access Stairs to enter    Entrance Stairs-Number of Steps 3    Entrance Stairs-Rails Right    Home Layout One level    Home Equipment [place] - single point;Walker - standard;Shower seat   upright walker, has to step in a tub   Additional Comments reports still cooking, cleaning, has shower chair but does not use it      Prior Function   Level of Independence Independent with community mobility with device    Leisure bowling 1 or 2 times a week       Observation/Other Assessments   Focus on Therapeutic Outcomes (FOTO)  49      Sensation   Light Touch Appears Intact    Hot/Cold --   per pt report     Coordination   Gross Motor Movements are Fluid and Coordinated Yes      ROM / Strength   AROM / PROM / Strength Strength      Strength   Strength Assessment Site Hip;Knee;Ankle    Right/Left Hip Right;Left    Right Hip Flexion 3+/5    Left Hip Flexion 3+/5    Right/Left Knee Right;Left    Right Knee  Flexion 5/5    Right Knee Extension 5/5    Left Knee Flexion 4+/5    Left Knee Extension 5/5    Right/Left Ankle Right;Left    Right Ankle Dorsiflexion 4+/5  Left Ankle Dorsiflexion 4+/5      Transfers   Transfers Sit to Stand;Stand to Sit    Sit to Stand 5: Supervision;Without upper extremity assist;From chair/3-in-1    Five time sit to stand comments  19.97 seconds with wide BOS    Stand to Sit 5: Supervision;Without upper extremity assist;To chair/3-in-1    Comments pt reporting incr swimmy headed after performing      Ambulation/Gait   Ambulation/Gait Yes    Ambulation/Gait Assistance 5: Supervision    Ambulation Distance (Feet) --   clinic distances   Assistive device Straight cane    Gait Pattern Step-through pattern;Wide base of support    Ambulation Surface Indoor;Level    Gait velocity 21.12 seconds = 1.55 ft/sec      Standardized Balance Assessment   Standardized Balance Assessment Berg Balance Test;Timed Up and Go Test      Berg Balance Test   Sit to Stand Able to stand without using hands and stabilize independently    Standing Unsupported Able to stand safely 2 minutes    Sitting with Back Unsupported but Feet Supported on Floor or Stool Able to sit safely and securely 2 minutes    Stand to Sit Sits safely with minimal use of hands    Transfers Able to transfer safely, minor use of hands    Standing Unsupported with Eyes Closed Able to stand 10 seconds safely    Standing Unsupported with Feet Together Able to place feet together independently and stand for 1 minute with supervision    From Standing, Reach Forward with Outstretched Arm Can reach forward >12 cm safely (5")    From Standing Position, Pick up Object from Floor Able to pick up shoe safely and easily    From Standing Position, Turn to Look Behind Over each Shoulder Looks behind from both sides and weight shifts well    incr swimmyheaded 5/10   Turn 360 Degrees Able to turn 360 degrees safely one side  only in 4 seconds or less   3.97 seconds to L, 5.03 to R, incr swimmyheaded 5/10   Standing Unsupported, Alternately Place Feet on Step/Stool Able to stand independently and safely and complete 8 steps in 20 seconds    Standing Unsupported, One Foot in Front Able to plae foot ahead of the other independently and hold 30 seconds    Standing on One Leg Tries to lift leg/unable to hold 3 seconds but remains standing independently    Total Score 49    Berg comment: 49/56      Timed Up and Go Test   Normal TUG (seconds) 16.03   with SPC                     Objective measurements completed on examination: See above findings.               PT Education - 01/05/20 1643    Education Details POC, results of outcome measures.    Person(s) Educated Patient;Spouse    Methods Explanation    Comprehension Verbalized understanding            PT Short Term Goals - 01/05/20 1656      PT SHORT TERM GOAL #1   Title Pt will undergo further vestibular assessment with LTG to be written as appropriate. ALL STGS DUE 01/26/20    Time 3    Period Weeks    Status New    Target Date 01/26/20  PT SHORT TERM GOAL #2   Title Pt will undergo DGI for further dynamic balance testing with LTG written as appropriate.    Time 3    Period Weeks    Status New      PT SHORT TERM GOAL #3   Title Pt will improve gait speed with SPC to at least 1.8 ft/sec in order to demo decr fall risk.    Baseline 1.55 ft/sec with SPC    Time 3    Period Weeks    Status New      PT SHORT TERM GOAL #4   Title Pt will decr 5x sit <> stand to 17 seconds or less in order to demo decr fall risk..    Baseline 19.97 seconds    Time 3    Period Weeks    Status New                PT Long Term Goals - 01/05/20 1658      PT LONG TERM GOAL #1   Title Pt will be independent with final HEP in order to build upon functional gains made in therapy. ALL LTGS DUE 02/16/20    Time 6    Period Weeks     Status New    Target Date 02/16/20      PT LONG TERM GOAL #2   Title DGI goal to be written as appropriate to decr fall risk.    Time 6    Period Weeks    Status New      PT LONG TERM GOAL #3   Title Pt will decr TUG time with SPC or with no AD to 13.5 seconds or less in order to demo decr fall risk.    Time 6    Period Weeks    Status New      PT LONG TERM GOAL #4   Title Vestibular goal to be written as appropriate after formal vestibular assessment.    Time 6    Period Weeks    Status New      PT LONG TERM GOAL #5   Title Pt will improve D/C FOTO score to a 58 in order to improve functional outcomes.    Baseline FOTO 49    Time 6    Period Weeks    Status New               Plan - 01/05/20 1649    Clinical Impression Statement Patient is a 86 year old female referred to Neuro OPPT for s/p R CVA. Pt's PMH is significant for: prior CVA without residual deficit, TIA, DM, PE, anemia, anxiety, hypertension, hyperlipidemia, B knee replacements, B shoulder replacements. The following deficits were present during the exam:  decr BLE strength, impaired balance, gait abnormalities, incr dizziness/swimmyheadedness with turns during BERG. Based on 5x sit <> stand and TUG, pt is at an incr risk for falls. Based on BERG, pt is at a moderate fall risk.  Pt reports she feels that her balance/walking is at her baseline (however measures indicate incr risk for falls) and that she has an incr swimmyheaded feeling (has a hx of this in the past and went to PT for it, but reports it is worse after her CVA). Pt would benefit from skilled PT to address these impairments and functional limitations to maximize functional mobility independence to decr pt's fall risk    Personal Factors and Comorbidities Comorbidity 3+    Comorbidities prior CVA without residual  deficit, TIA, DM, PE, anemia, anxiety, hypertension, hyperlipidemia, B knee replacements, B shoulder replacements    Examination-Activity  Limitations Stairs;Locomotion Level    Examination-Participation Restrictions Community Activity    Stability/Clinical Decision Making Evolving/Moderate complexity    Clinical Decision Making Moderate    Rehab Potential Good    PT Frequency 2x / week    PT Duration 6 weeks    PT Treatment/Interventions ADLs/Self Care Home Management;Canalith Repostioning;DME Instruction;Gait training;Stair training;Functional mobility training;Therapeutic activities;Therapeutic exercise;Neuromuscular re-education;Balance training;Patient/family education;Vestibular    PT Next Visit Plan initial HEP for balance/BLE strengthening, perform DGI. will need further vestibular assessment.    Consulted and Agree with Plan of Care Patient;Family member/caregiver    Family Member Consulted pt's husband           Patient will benefit from skilled therapeutic intervention in order to improve the following deficits and impairments:  Abnormal gait, Decreased balance, Decreased strength, Dizziness, Decreased activity tolerance, Difficulty walking  Visit Diagnosis: Unsteadiness on feet  Muscle weakness (generalized)  Other abnormalities of gait and mobility  Dizziness and giddiness     Problem List Patient Active Problem List   Diagnosis Date Noted  . Cerebral amyloid angiopathy (CODE)   . Stroke (HCC) 11/24/2019  . AKI (acute kidney injury) (HCC) 11/24/2019  . Spinal stenosis of lumbar region with neurogenic claudication 10/26/2019  . Abnormality of gait 08/02/2019  . Cerebrovascular accident (CVA) (HCC) 07/22/2019  . Dizziness 06/15/2019  . Brain aneurysm 06/15/2019  . Chronic pain syndrome 04/29/2019  . Chronic right-sided low back pain without sciatica 04/12/2019  . Arthritis of shoulder region, left, degenerative 12/03/2013  . Nonspecific abnormal electrocardiogram (ECG) (EKG) 10/15/2013  . Other and unspecified angina pectoris 10/15/2013  . TIA (transient ischemic attack) 11/15/2011  . HTN  (hypertension) 11/15/2011  . Diabetes mellitus (HCC) 11/15/2011  . Hypothyroidism 11/15/2011  . Dyslipidemia 11/15/2011  . GERD (gastroesophageal reflux disease) 11/15/2011  . Pyuria 11/15/2011    Drake Leach, PT, DPT  01/05/2020, 5:01 PM  [facility] [address] [place], [place], [address] Phone: [phone]   Fax:  [phone]  Name: PERCY COMP MRN: [id number] Date of Birth: November 27, 1932

## 2020-01-05 NOTE — Therapy (Signed)
[facility] [address] [place], [place], [address] Phone: [phone]   Fax:  [phone]  Speech Language Pathology Evaluation  Patient Details  Name: Hannah Fischer MRN: [id number] Date of Birth: [birth year] Referring Provider (SLP): Augustine Radar., MD (referring) Sigmund Hazel, MD (doc)   Encounter Date: 01/05/2020   End of Session - 01/05/20 2326    Visit Number 1    Number of Visits 17    Date for SLP Re-Evaluation 04/04/20    SLP Start Time 1150    SLP Stop Time  1232    SLP Time Calculation (min) 42 min    Activity Tolerance Patient tolerated treatment well           Past Medical History:  Diagnosis Date  . Abnormal nuclear stress test    2005, normal cath Dr. Elease Hashimoto, normal stress test 2012 Dr. Eldridge Dace  . Anemia    chronic  . Anxiety   . Arthritis    djd  . Arthritis of shoulder region, left, degenerative 12/03/2013  . Blood transfusion 1979   for anemia  . Chest pain    evaluated @ [facility] cardiology; had a stress test 2012  . Chronic pain syndrome 04/29/2019  . Chronic right-sided low back pain without sciatica 04/12/2019  . Complication of anesthesia    unable to breathe lying  flat on back  . Diabetes mellitus    type 2  niddm x 25 yrs  . Diabetes mellitus (HCC) 11/15/2011  . Diverticular disease   . DJD (degenerative joint disease)    lumbar and cervical  . GERD (gastroesophageal reflux disease)   . Hyperlipemia   . Hypertension    on medication  . Hypothyroidism    takes levoxyl  . Irritable bowel syndrome   . 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 Neuromuscular disorder (HCC)   . Neuropathy in diabetes (HCC)   . Nonspecific abnormal electrocardiogram (ECG) (EKG) 10/15/2013  . Other and unspecified angina pectoris    tests came back NOT heart related  . Pulmonary embolism (HCC) 06-2010   bilateral  . Pyuria 11/15/2011  . Seizures (HCC)    once, age 86  . TIA  (transient ischemic attack) 11/15/2011    Past Surgical History:  Procedure Laterality Date  . BACK SURGERY    . BLEPHAROPLASTY  2012   rt eye  . CARDIAC CATHETERIZATION     had one 10/2013  . CHOLECYSTECTOMY    . EYE SURGERY  2007   cataract ext/ iol rt eye  . JOINT REPLACEMENT  2001,2005   both knees  . LEFT HEART CATHETERIZATION WITH CORONARY ANGIOGRAM N/A 10/18/2013   Procedure: LEFT HEART CATHETERIZATION WITH CORONARY ANGIOGRAM;  Surgeon: Corky Crafts, MD;  [facility] C[facility] LAB;  Service: Cardiovascular;  Laterality: N/A;  . LUMBAR DISC SURGERY  1990  . REVERSE SHOULDER ARTHROPLASTY Left 12/03/2013   Procedure: LEFT SHOULDER REVERSE ARTHROPLASTY;  Surgeon: Verlee Rossetti, MD;  Location[facility] OR;  Service: Orthopedics;  Laterality: Left;  . SHOULDER HEMI-ARTHROPLASTY  12/25/2010   Procedure: SHOULDER HEMI-ARTHROPLASTY;  Surgeon: Cammy Copa;  Location: [facility] OR;  Service: Orthopedics;  Laterality: Right;    There were no vitals filed for this visit.       SLP Evaluation [facility] - 01/05/20 1102      SLP Visit Information   SLP Received On 01/05/20    Referring Provider (SLP) Augustine Radar., MD (referring) Sigmund Hazel,  MD (doc)    Onset Date 11-22-19    Medical Diagnosis CVA      Subjective   Subjective Pt and husband agree pt is at baseline.      General Information   HPI Pt with MRI in June 2021 indicating chronic lt parietal occipital ischemic infarctions, with numerous chronic cerebral microhemmorhages. Foci of sulcal hemosiderin deposits were observed in rt frontal and lt parietal regions - suggesting possible amyloid angiopathy. Pt presented to ED on 11-23-19 with CVA-like symptoms of LUE parasthesias adn weakness since previous morning, LLE heaviness and tongue numbness. PMH of prior CVA mentioned prior without residual deficit, TIA, DM, PE, anemia, anxiety, HTN, hyperlipidemia. SLE on 11-25-19 on acute care revealed deficits in memory and specifically in  working memory - demonstrated by difficulty with recall of verbal directions.       Balance Screen   Has the patient fallen in the past 6 months No      Prior Functional Status   Cognitive/Linguistic Baseline Within functional limits    Type of Home House     Lives With Spouse;Family    Available Support Family    Education graduated [employer]    Vocation Retired      IT consultant   Overall Cognitive Status Impaired/Different from baseline    Area of Impairment Attention;Following commands;Memory;Awareness   processing?   Attention Comments impulsive at times during eval, beginning tasks prior to end of instructions    Memory Comments recalled 8/18 items on story retell, then 5/6 correct with yes/no's (possibly due to impulsivity). Pt req'd repeat of the time to put on the clock., repeated approx 20% of items with verbal fluency subtest    Following Command Comments SLP had to repeat once the instructions on 2 subtests    Awareness Intellectual    Awareness Comments Intellectual awareness vs. pt personality, limited/no emergent awareness with trail making    Behaviors Poor frustration tolerance   pt told SLP this is different than prior to current CVAs     Auditory Comprehension   Overall Auditory Comprehension Appears within functional limits for tasks assessed      Verbal Expression   Overall Verbal Expression Appears within functional limits for tasks assessed      Oral Motor/Sensory Function   Overall Oral Motor/Sensory Function Appears within functional limits for tasks assessed      Motor Speech   Overall Motor Speech Appears within functional limits for tasks assessed                           SLP Education - 01/05/20 2325    Education Details eval results, possible goals    Person(s) Educated Patient;Spouse    Methods Explanation    Comprehension Verbalized understanding            SLP Short Term Goals - 01/05/20 2334      SLP SHORT TERM GOAL #1    Title pt will demonstrate compensations for decr'd comprehension of written or auditory information x 2 sessions (reqeuest repeats, write notes,etc)    Time 4    Period Weeks   or 9 total sessions, for all STGs   Status New      SLP SHORT TERM GOAL #2   Title pt will demonstrate understanding of temporal orientation (in order to track appointments) with modified independence x3 sessions    Time 4    Period Weeks    Status New  SLP SHORT TERM GOAL #3   Title pt will organize functional items such as grocery list, errands, to-do list (etc) with extra time x2 sessions    Time 4    Period Weeks    Status New      SLP SHORT TERM GOAL #4   Title pt will complete cogntive linguistic testing    Time 2    Period Weeks    Status New      SLP SHORT TERM GOAL #5   Title pt will demonstrate selective attention for 7 minutes in min noisy environment to complete a simple-mod complex linguistic task    Time 4    Period Weeks    Status New            SLP Long Term Goals - 01/05/20 2340      SLP LONG TERM GOAL #1   Title pt will demonstrate WFL alternating attention for 7 minutes to complete a simple-mod complex linguistic task    Time 8    Period Weeks   or 17 sessions, for all LTGs   Status New      SLP LONG TERM GOAL #2   Title pt will demonstrate [facility] organization skills in min-mod complex linguistic task in 3 sessions    Time 8    Period Weeks    Status New      SLP LONG TERM GOAL #3   Title pt will demo emergent awareness in therapy tasks 100% with min nonverbal cues in 3 sessions    Time 8    Period Weeks    Status New            Plan - 01/05/20 2326    Clinical Impression Statement Carney BernJean presents today with likely min-mod cognitive communication deficits, however, the extent pt deficit differs from her baseline is unknown at this time as pt and husband both agreed pt was at baseline and pt has long standing hx of neurological insult including amyloid angiopathy.  Today she demonstrated decr'd attention, awareness, organization, and memory.    Speech Therapy Frequency 2x / week    Duration 8 weeks   or 17 total visits   Treatment/Interventions Environmental controls;Compensatory techniques;Functional tasks;SLP instruction and feedback;Cognitive reorganization;Internal/external aids;Patient/family education    Potential to Achieve Goals Fair    Potential Considerations Previous level of function;Cooperation/participation level    Consulted and Agree with Plan of Care Patient           Patient will benefit from skilled therapeutic intervention in order to improve the following deficits and impairments:   Cognitive communication deficit - Plan: SLP plan of care cert/re-cert    Problem List Patient Active Problem List   Diagnosis Date Noted  . Cerebral amyloid angiopathy (CODE)   . Stroke (HCC) 11/24/2019  . AKI (acute kidney injury) (HCC) 11/24/2019  . Spinal stenosis of lumbar region with neurogenic claudication 10/26/2019  . Abnormality of gait 08/02/2019  . Cerebrovascular accident (CVA) (HCC) 07/22/2019  . Dizziness 06/15/2019  . Brain aneurysm 06/15/2019  . Chronic pain syndrome 04/29/2019  . Chronic right-sided low back pain without sciatica 04/12/2019  . Arthritis of shoulder region, left, degenerative 12/03/2013  . Nonspecific abnormal electrocardiogram (ECG) (EKG) 10/15/2013  . Other and unspecified angina pectoris 10/15/2013  . TIA (transient ischemic attack) 11/15/2011  . HTN (hypertension) 11/15/2011  . Diabetes mellitus (HCC) 11/15/2011  . Hypothyroidism 11/15/2011  . Dyslipidemia 11/15/2011  . GERD (gastroesophageal reflux disease) 11/15/2011  . Pyuria 11/15/2011  [facility] ,MS, CCC-SLP  01/05/2020, 11:47 PM  [facility] [address] [place], [place], [address] Phone: [phone]   Fax:  [phone]  Name: JANIENE AARONS MRN: [id number] Date of  Birth: April 02, 1932

## 2020-01-05 NOTE — Therapy (Signed)
[facility] [address] [place], [place], [address] Phone: [phone]   Fax:  [phone]  Occupational Therapy Evaluation  Patient Details  Name: Hannah Fischer MRN: [id number] Date of Birth: 02-Aug-1932 No data recorded  Encounter Date: 01/05/2020   OT End of Session - 01/05/20 1212    Visit Number 1    Number of Visits 9    Date for OT Re-Evaluation 02/05/20    Authorization Type BC/BS MCR    Progress Note Due on Visit 10    OT Start Time 1100    OT Stop Time 1135    OT Time Calculation (min) 35 min    Activity Tolerance Patient tolerated treatment well    Behavior During Therapy [facility] for tasks assessed/performed           Past Medical History:  Diagnosis Date  . Abnormal nuclear stress test    2005, normal cath Dr. Elease Hashimoto, normal stress test 2012 Dr. Eldridge Dace  . Anemia    chronic  . Anxiety   . Arthritis    djd  . Arthritis of shoulder region, left, degenerative 12/03/2013  . Blood transfusion 1979   for anemia  . Chest pain    evaluated @ [facility] cardiology; had a stress test 2012  . Chronic pain syndrome 04/29/2019  . Chronic right-sided low back pain without sciatica 04/12/2019  . Complication of anesthesia    unable to breathe lying  flat on back  . Diabetes mellitus    type 2  niddm x 25 yrs  . Diabetes mellitus (HCC) 11/15/2011  . Diverticular disease   . DJD (degenerative joint disease)    lumbar and cervical  . GERD (gastroesophageal reflux disease)   . Hyperlipemia   . Hypertension    on medication  . Hypothyroidism    takes levoxyl  . Irritable bowel syndrome   . 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 Neuromuscular disorder (HCC)   . Neuropathy in diabetes (HCC)   . Nonspecific abnormal electrocardiogram (ECG) (EKG) 10/15/2013  . Other and unspecified angina pectoris    tests came back NOT heart related  . Pulmonary embolism (HCC) 06-2010   bilateral  . Pyuria  11/15/2011  . Seizures (HCC)    once, age 9  . TIA (transient ischemic attack) 11/15/2011    Past Surgical History:  Procedure Laterality Date  . BACK SURGERY    . BLEPHAROPLASTY  2012   rt eye  . CARDIAC CATHETERIZATION     had one 10/2013  . CHOLECYSTECTOMY    . EYE SURGERY  2007   cataract ext/ iol rt eye  . JOINT REPLACEMENT  2001,2005   both knees  . LEFT HEART CATHETERIZATION WITH CORONARY ANGIOGRAM N/A 10/18/2013   Procedure: LEFT HEART CATHETERIZATION WITH CORONARY ANGIOGRAM;  Surgeon: Corky Crafts, MD;  Location: [facility] CATH LAB;  Service: Cardiovascular;  Laterality: N/A;  . LUMBAR DISC SURGERY  1990  . REVERSE SHOULDER ARTHROPLASTY Left 12/03/2013   Procedure: LEFT SHOULDER REVERSE ARTHROPLASTY;  Surgeon: Verlee Rossetti, MD;  Location: [facility] OR;  Service: Orthopedics;  Laterality: Left;  . SHOULDER HEMI-ARTHROPLASTY  12/25/2010   Procedure: SHOULDER HEMI-ARTHROPLASTY;  Surgeon: Cammy Copa;  Location: [facility] OR;  Service: Orthopedics;  Laterality: Right;    There were no vitals filed for this visit.   Subjective Assessment - 01/05/20 1107    Patient is accompanied by: Family member   HUSBAND   Pertinent  History CVA w/ Lt hemiplegia 11/23/19. PMH: previous CVA w/ no residual deficits, DM, PE, HTN, HLD, anxiety. P has walked w/ cane for about a year when outside    Limitations fall risk    Currently in Pain? No/denies             [facility] OT Assessment - 01/05/20 1110      Assessment   Medical Diagnosis CVA    Onset Date/Surgical Date 11/23/19    Hand Dominance Right    Prior Therapy previous PT for feeling "swimmyheaded" and for B knee replacements and B shoulders      Precautions   Precautions Fall      Home  Environment   Bathroom Shower/Tub Tub/Shower unit;Curtain    Home Equipment Shower seat;Cane - single point    Additional Comments Pt lives in 1 level home w/ 3 steps to enter    Lives With Spouse   and great granddaughter (45 y.o. )     Prior  Function   Level of Independence Independent    Leisure bowling 1 or 2 times a week       ADL   Eating/Feeding Independent    Grooming Independent    Product manager Independent    Lower Body Bathing Independent    Upper Body Dressing Independent    Lower Body Dressing Modified independent   slip on shoes   Lobbyist - Solicitor -  Database administrator Independent    ADL comments Pt reports she really never stopped performing ADLS/IADLS from stroke      IADL   Shopping --   husband usually goes together   Artist Does personal laundry completely;Performs light daily tasks such as dishwashing, bed making   uses dishwasher   Meal Prep Plans, prepares and serves adequate meals independently   Husband always assist - did together   Training and development officer own vehicle    Medication Management Is responsible for taking medication in correct dosages at correct time    Financial Management --   husband has always done     Mobility   Mobility Status Comments uses cane when outside/community (premorbid status)       Written Expression   Dominant Hand Right      Vision - History   Baseline Vision Wears glasses all the time   but only uses for reading currently   Additional Comments denies changes from stroke      Cognition   Memory --   Pt reports slightly decreased since stroke Difficulty following directions for 9 hole peg test below     Observation/Other Assessments   Observations OA both hands      Sensation   Light Touch Appears Intact   for UE's (reports numbness around mouth)      Coordination   9 Hole Peg Test Right;Left    Right 9 Hole Peg Test 24.59 sec    Left 9 Hole Peg Test 57.63 sec      Edema   Edema none in UE's      ROM / Strength   AROM / PROM / Strength AROM;Strength      AROM   Overall AROM Comments BUE AROM WNL's      Strength   Overall  Strength Comments BUE MMT grossly 5/5      Hand Function   Right Hand Grip (lbs) 30 lbs  Left Hand Grip (lbs) 20 lbs                                OT Long Term Goals - 01/05/20 1217      OT LONG TERM GOAL #1   Title Independent with coordination HEP for Lt hand    Time 4    Period Weeks    Status New      OT LONG TERM GOAL #2   Title Pt to report less drops from Lt hand throughout day    Time 4    Period Weeks    Status New      OT LONG TERM GOAL #3   Title Pt to improve coordination as evidenced by performing 9 hole peg test in 45 sec or less    Baseline 57.63 sec    Time 4    Period Weeks    Status New      OT LONG TERM GOAL #4   Title Pt to verbalize understanding with memory strategies and cognitive tips prn    Time 4    Period Weeks    Status New      OT LONG TERM GOAL #5   Title Pt to perform simple physical task w/ environmental scanning for dual tasking    Time 4    Period Weeks    Status New                 Plan - 01/05/20 1213    Clinical Impression Statement 86 year old female with a history of prior CVA without residual deficit, TIA, DM, PE, anemia, anxiety, hypertension, hyperlipidemia who presented to the ED on 10/19 with concern for strokelike symptoms of LUE paresthesias and weakness since 10 am on 10/18 as well as LLE heaviness and tongue numbness. MRI/MRA notable for: small acute to subacute infarcts involving the right frontal lobe, left parietal lobe and right occipital lobe. Pt now presents to OPOT s/p CVA w/ deficits in Lt hand coordination and memory deficits. Pt would benefit from O.T. to address these deficits    OT Occupational Profile and History Problem Focused Assessment - Including review of records relating to presenting problem    Occupational performance deficits (Please refer to evaluation for details): IADL's;Leisure    Body Structure / Function / Physical Skills UE functional use;IADL;FMC;Coordination      Cognitive Skills Memory;Problem Solve;Safety Awareness    Rehab Potential Good    Clinical Decision Making Limited treatment options, no task modification necessary    Comorbidities Affecting Occupational Performance: May have comorbidities impacting occupational performance    Modification or Assistance to Complete Evaluation  No modification of tasks or assist necessary to complete eval    OT Frequency 2x / week    OT Duration 4 weeks   plus eval (however anticipate only 2-3 weeks needed)   OT Treatment/Interventions Therapeutic activities;Therapeutic exercise;Cognitive remediation/compensation;Coping strategies training;Functional Mobility Training;Visual/perceptual remediation/compensation;Patient/family education;Neuromuscular education    Plan coordination HEP, memory strategies    Consulted and Agree with Plan of Care Patient;Family member/caregiver    Family Member Consulted husband           Patient will benefit from skilled therapeutic intervention in order to improve the following deficits and impairments:   Body Structure / Function / Physical Skills: UE functional use, IADL, FMC, Coordination Cognitive Skills: Memory, Problem Solve, Safety Awareness     Visit Diagnosis: Other lack of  coordination  Other symptoms and signs involving cognitive functions following cerebral infarction    Problem List Patient Active Problem List   Diagnosis Date Noted  . Cerebral amyloid angiopathy (CODE)   . Stroke (HCC) 11/24/2019  . AKI (acute kidney injury) (HCC) 11/24/2019  . Spinal stenosis of lumbar region with neurogenic claudication 10/26/2019  . Abnormality of gait 08/02/2019  . Cerebrovascular accident (CVA) (HCC) 07/22/2019  . Dizziness 06/15/2019  . Brain aneurysm 06/15/2019  . Chronic pain syndrome 04/29/2019  . Chronic right-sided low back pain without sciatica 04/12/2019  . Arthritis of shoulder region, left, degenerative 12/03/2013  . Nonspecific abnormal  electrocardiogram (ECG) (EKG) 10/15/2013  . Other and unspecified angina pectoris 10/15/2013  . TIA (transient ischemic attack) 11/15/2011  . HTN (hypertension) 11/15/2011  . Diabetes mellitus (HCC) 11/15/2011  . Hypothyroidism 11/15/2011  . Dyslipidemia 11/15/2011  . GERD (gastroesophageal reflux disease) 11/15/2011  . Pyuria 11/15/2011    Kelli Churn, OTR/L 01/05/2020, 12:19 PM  [facility] [address] [place], [place], [address] Phone: [phone]   Fax:  [phone]  Name: Hannah Fischer MRN: [id number] Date of Birth: 03/14/1932

## 2020-01-06 NOTE — Addendum Note (Signed)
Addended by: Drake Leach on: 01/06/2020 09:28 AM   Modules accepted: Orders

## 2020-01-12 ENCOUNTER — Ambulatory Visit: Payer: Medicare Other | Admitting: Occupational Therapy

## 2020-01-12 ENCOUNTER — Other Ambulatory Visit: Payer: Self-pay

## 2020-01-12 ENCOUNTER — Encounter: Payer: Self-pay | Admitting: Physical Therapy

## 2020-01-12 ENCOUNTER — Ambulatory Visit: Payer: Medicare Other | Admitting: Physical Therapy

## 2020-01-12 ENCOUNTER — Encounter: Payer: Self-pay | Admitting: Occupational Therapy

## 2020-01-12 DIAGNOSIS — R2681 Unsteadiness on feet: Secondary | ICD-10-CM

## 2020-01-12 DIAGNOSIS — R42 Dizziness and giddiness: Secondary | ICD-10-CM

## 2020-01-12 DIAGNOSIS — I69318 Other symptoms and signs involving cognitive functions following cerebral infarction: Secondary | ICD-10-CM | POA: Diagnosis not present

## 2020-01-12 DIAGNOSIS — M6281 Muscle weakness (generalized): Secondary | ICD-10-CM

## 2020-01-12 DIAGNOSIS — R2689 Other abnormalities of gait and mobility: Secondary | ICD-10-CM | POA: Diagnosis not present

## 2020-01-12 DIAGNOSIS — R278 Other lack of coordination: Secondary | ICD-10-CM

## 2020-01-12 DIAGNOSIS — R41841 Cognitive communication deficit: Secondary | ICD-10-CM | POA: Diagnosis not present

## 2020-01-12 NOTE — Patient Instructions (Signed)
  Coordination Activities  Perform the following activities for 5-10 minutes 1-2 times per day with left hand(s).   Flip cards 1 at a time as fast as you can.  Deal cards with your thumb (Hold deck in hand and push card off top with thumb).  Rotate card in hand (clockwise and counter-clockwise).  Shuffle cards.  Pick up coins and place in container or coin bank.  Pick up coins one at a time until you get 5-10 in your hand, then move coins from palm to fingertips to stack one at a time.

## 2020-01-12 NOTE — Therapy (Signed)
[facility] [address] [place], [place], [address] Phone: [phone]   Fax:  [phone]  Physical Therapy Treatment  Patient Details  Name: Hannah Fischer MRN: [id number] Date of Birth: 02-Oct-1932 Referring Provider (PT): Azucena Fallen, MD (will be followed by Dr. Terrace Arabia)   Encounter Date: 01/12/2020   PT End of Session - 01/12/20 1315    Visit Number 2    Number of Visits 13    Date for PT Re-Evaluation 04/04/20   written for 6 week POC   Authorization Type BCBS Medicare    PT Start Time 1231    PT Stop Time 1313    PT Time Calculation (min) 42 min    Equipment Utilized During Treatment Gait belt    Activity Tolerance Patient tolerated treatment well   limited by dizziness at times   Behavior During Therapy [facility] for tasks assessed/performed           Past Medical History:  Diagnosis Date  . Abnormal nuclear stress test    2005, normal cath Dr. Elease Hashimoto, normal stress test 2012 Dr. Eldridge Dace  . Anemia    chronic  . Anxiety   . Arthritis    djd  . Arthritis of shoulder region, left, degenerative 12/03/2013  . Blood transfusion 1979   for anemia  . Chest pain    evaluated @ [facility] cardiology; had a stress test 2012  . Chronic pain syndrome 04/29/2019  . Chronic right-sided low back pain without sciatica 04/12/2019  . Complication of anesthesia    unable to breathe lying  flat on back  . Diabetes mellitus    type 2  niddm x 25 yrs  . Diabetes mellitus (HCC) 11/15/2011  . Diverticular disease   . DJD (degenerative joint disease)    lumbar and cervical  . GERD (gastroesophageal reflux disease)   . Hyperlipemia   . Hypertension    on medication  . Hypothyroidism    takes levoxyl  . Irritable bowel syndrome   . 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 Neuromuscular disorder (HCC)   . Neuropathy in diabetes (HCC)   . Nonspecific abnormal electrocardiogram (ECG) (EKG) 10/15/2013  . Other  and unspecified angina pectoris    tests came back NOT heart related  . Pulmonary embolism (HCC) 06-2010   bilateral  . Pyuria 11/15/2011  . Seizures (HCC)    once, age 55  . TIA (transient ischemic attack) 11/15/2011    Past Surgical History:  Procedure Laterality Date  . BACK SURGERY    . BLEPHAROPLASTY  2012   rt eye  . CARDIAC CATHETERIZATION     had one 10/2013  . CHOLECYSTECTOMY    . EYE SURGERY  2007   cataract ext/ iol rt eye  . JOINT REPLACEMENT  2001,2005   both knees  . LEFT HEART CATHETERIZATION WITH CORONARY ANGIOGRAM N/A 10/18/2013   Procedure: LEFT HEART CATHETERIZATION WITH CORONARY ANGIOGRAM;  Surgeon: Corky Crafts, MD;  Location: [facility] CATH LAB;  Service: Cardiovascular;  Laterality: N/A;  . LUMBAR DISC SURGERY  1990  . REVERSE SHOULDER ARTHROPLASTY Left 12/03/2013   Procedure: LEFT SHOULDER REVERSE ARTHROPLASTY;  Surgeon: Verlee Rossetti, MD;  [facility]: [facility] OR;  Service: Orthopedics;  Laterality: Left;  . SHOULDER HEMI-ARTHROPLASTY  12/25/2010   Procedure: SHOULDER HEMI-ARTHROPLASTY;  Surgeon: Cammy Copa;  Location: [facility] OR;  Service: Orthopedics;  Laterality: Right;    There were no vitals filed for this  visit.   Subjective Assessment - 01/12/20 1233    Subjective No changes since she was last here. Doing well.    Patient is accompained by: Family member   husband   Pertinent History prior CVA without residual deficit, TIA, DM, PE, anemia, anxiety, hypertension, hyperlipidemia    Diagnostic tests MRI + small acute to subacute infarcts involving R frontal, L parietal and R occipital lobes.    Patient Stated Goals wants her L hand to work better    Currently in Pain? No/denies              [facility] PT Assessment - 01/12/20 1234      Standardized Balance Assessment   Standardized Balance Assessment Dynamic Gait Index      Dynamic Gait Index   Level Surface Mild Impairment    Change in Gait Speed Mild Impairment    Gait with Horizontal Head  Turns Mild Impairment    Gait with Vertical Head Turns Mild Impairment    Gait and Pivot Turn Mild Impairment    Step Over Obstacle Mild Impairment    Step Around Obstacles Normal    Steps Moderate Impairment    Total Score 16    DGI comment: 16/24   no AD, with exception of stairs with [facility]                 Access Code: [email or web address] URL: [email or web address] Date: 01/12/2020 Prepared by: Sherlie Ban  Initiated HEP - see MedBridge for more details   Exercises Standing Tandem Balance with Counter Support - 1 x daily - 5 x weekly - 3 sets - 20 hold Standing Marching - 2 x daily - 5 x weekly - 3 reps Standing Single Leg Stance with Counter Support - 2 x daily - 5 x weekly - 3 sets - 10 hold Standing Balance with Eyes Closed on Foam - 2 x daily - 5 x weekly - 3 sets - 30 hold - with feet hip width distance              Balance Exercises - 01/12/20 0001      Balance Exercises: Standing   SLS with Vectors Solid surface;Limitations    SLS with Vectors Limitations alternating step taps: to 6" step with no UE support for SLS x10 reps B and then to 2nd step with BUE support x10 reps B             PT Education - 01/12/20 1315    Education Details initial HEP, results of balance test.    Person(s) Educated Patient;Spouse    Methods Explanation;Demonstration;Handout    Comprehension Verbalized understanding;Returned demonstration            PT Short Term Goals - 01/12/20 1724      PT SHORT TERM GOAL #1   Title Pt will undergo further vestibular assessment with LTG to be written as appropriate. ALL STGS DUE 01/26/20    Time 3    Period Weeks    Status New    Target Date 01/26/20      PT SHORT TERM GOAL #2   Title Pt will undergo DGI for further dynamic balance testing with LTG written as appropriate.    Baseline 16/24 on 01/12/20    Time 3    Period Weeks    Status Achieved      PT SHORT TERM GOAL #3   Title Pt will improve gait speed with  SPC to at least 1.8 ft/sec in order to  demo decr fall risk.    Baseline 1.55 ft/sec with SPC    Time 3    Period Weeks    Status New      PT SHORT TERM GOAL #4   Title Pt will decr 5x sit <> stand to 17 seconds or less in order to demo decr fall risk..    Baseline 19.97 seconds    Time 3    Period Weeks    Status New             PT Long Term Goals - 01/12/20 1724      PT LONG TERM GOAL #1   Title Pt will be independent with final HEP in order to build upon functional gains made in therapy. ALL LTGS DUE 02/16/20    Time 6    Period Weeks    Status New      PT LONG TERM GOAL #2   Title Pt will improve DGI to at least a 19/24 in order to demo decr fall risk.    Baseline 16/24    Time 6    Period Weeks    Status Revised      PT LONG TERM GOAL #3   Title Pt will decr TUG time with SPC or with no AD to 13.5 seconds or less in order to demo decr fall risk.    Time 6    Period Weeks    Status New      PT LONG TERM GOAL #4   Title Vestibular goal to be written as appropriate after formal vestibular assessment.    Time 6    Period Weeks    Status New      PT LONG TERM GOAL #5   Title Pt will improve D/C FOTO score to a 58 in order to improve functional outcomes.    Baseline FOTO 49    Time 6    Period Weeks    Status New                 Plan - 01/12/20 1727    Clinical Impression Statement Performed the DGI today with pt scoring a 16/24, indicating an incr risk for falls. Pt reporting feeling incr feelings of dizziness with turns and gait with head motions. Pt reports she always feels a 7/10 swimmyheaded/dizziness feeling, but has been worse since after her CVA. Pt to benefit from further vestibular assessment. Worked on initiating HEP today for home with focus on narrow BOS balance, SLS, and balance with vision removed. pt tolerated session well. Rest breaks taken as appropriate.    Personal Factors and Comorbidities Comorbidity 3+    Comorbidities prior CVA  without residual deficit, TIA, DM, PE, anemia, anxiety, hypertension, hyperlipidemia, B knee replacements, B shoulder replacements    Examination-Activity Limitations Stairs;Locomotion Level    Examination-Participation Restrictions Community Activity    Stability/Clinical Decision Making Evolving/Moderate complexity    Rehab Potential Good    PT Frequency 2x / week    PT Duration 6 weeks    PT Treatment/Interventions ADLs/Self Care Home Management;Canalith Repostioning;DME Instruction;Gait training;Stair training;Functional mobility training;Therapeutic activities;Therapeutic exercise;Neuromuscular re-education;Balance training;Patient/family education;Vestibular    PT Next Visit Plan perform vestibular assessment. pt with incr dizziness with turns and head motions. balance with eyes closed, SLS.    Consulted and Agree with Plan of Care Patient;Family member/caregiver    Family Member Consulted pt's husband           Patient will benefit from skilled therapeutic intervention in  order to improve the following deficits and impairments:  Abnormal gait, Decreased balance, Decreased strength, Dizziness, Decreased activity tolerance, Difficulty walking  Visit Diagnosis: Other lack of coordination  Other symptoms and signs involving cognitive functions following cerebral infarction  Unsteadiness on feet  Muscle weakness (generalized)  Dizziness and giddiness     Problem List Patient Active Problem List   Diagnosis Date Noted  . Cerebral amyloid angiopathy (CODE)   . Stroke (HCC) 11/24/2019  . AKI (acute kidney injury) (HCC) 11/24/2019  . Spinal stenosis of lumbar region with neurogenic claudication 10/26/2019  . Abnormality of gait 08/02/2019  . Cerebrovascular accident (CVA) (HCC) 07/22/2019  . Dizziness 06/15/2019  . Brain aneurysm 06/15/2019  . Chronic pain syndrome 04/29/2019  . Chronic right-sided low back pain without sciatica 04/12/2019  . Arthritis of shoulder region,  left, degenerative 12/03/2013  . Nonspecific abnormal electrocardiogram (ECG) (EKG) 10/15/2013  . Other and unspecified angina pectoris 10/15/2013  . TIA (transient ischemic attack) 11/15/2011  . HTN (hypertension) 11/15/2011  . Diabetes mellitus (HCC) 11/15/2011  . Hypothyroidism 11/15/2011  . Dyslipidemia 11/15/2011  . GERD (gastroesophageal reflux disease) 11/15/2011  . Pyuria 11/15/2011    Drake Leachhloe N Shyonna Carlin, PT, DPT  01/12/2020, 5:29 PM  [facility] [address] [place], [place], [address] Phone: [phone]   Fax:  [phone]  Name: Marquette OldJean G Shifflett MRN: [id number] Date of Birth: 11/16/1932

## 2020-01-12 NOTE — Therapy (Signed)
[facility] [address] [place], [place], [address] Phone: [phone]   Fax:  [phone]  Occupational Therapy Treatment  Patient Details  Name: Hannah Fischer MRN: [id number] Date of Birth: 1932-04-08 No data recorded  Encounter Date: 01/12/2020   OT End of Session - 01/12/20 1226    Visit Number 2    Number of Visits 9    Date for OT Re-Evaluation 02/05/20    Authorization Type BC/BS MCR    Progress Note Due on Visit 10    OT Start Time 1145    OT Stop Time 1230    OT Time Calculation (min) 45 min    Activity Tolerance Patient tolerated treatment well    Behavior During Therapy [facility] for tasks assessed/performed           Past Medical History:  Diagnosis Date  . Abnormal nuclear stress test    2005, normal cath Dr. Elease Hashimoto, normal stress test 2012 Dr. Eldridge Dace  . Anemia    chronic  . Anxiety   . Arthritis    djd  . Arthritis of shoulder region, left, degenerative 12/03/2013  . Blood transfusion 1979   for anemia  . Chest pain    evaluated @ [facility] cardiology; had a stress test 2012  . Chronic pain syndrome 04/29/2019  . Chronic right-sided low back pain without sciatica 04/12/2019  . Complication of anesthesia    unable to breathe lying  flat on back  . Diabetes mellitus    type 2  niddm x 25 yrs  . Diabetes mellitus (HCC) 11/15/2011  . Diverticular disease   . DJD (degenerative joint disease)    lumbar and cervical  . GERD (gastroesophageal reflux disease)   . Hyperlipemia   . Hypertension    on medication  . Hypothyroidism    takes levoxyl  . Irritable bowel syndrome   . 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 Neuromuscular disorder (HCC)   . Neuropathy in diabetes (HCC)   . Nonspecific abnormal electrocardiogram (ECG) (EKG) 10/15/2013  . Other and unspecified angina pectoris    tests came back NOT heart related  . Pulmonary embolism (HCC) 06-2010   bilateral  . Pyuria  11/15/2011  . Seizures (HCC)    once, age 18  . TIA (transient ischemic attack) 11/15/2011    Past Surgical History:  Procedure Laterality Date  . BACK SURGERY    . BLEPHAROPLASTY  2012   rt eye  . CARDIAC CATHETERIZATION     had one 10/2013  . CHOLECYSTECTOMY    . EYE SURGERY  2007   cataract ext/ iol rt eye  . JOINT REPLACEMENT  2001,2005   both knees  . LEFT HEART CATHETERIZATION WITH CORONARY ANGIOGRAM N/A 10/18/2013   Procedure: LEFT HEART CATHETERIZATION WITH CORONARY ANGIOGRAM;  Surgeon: Corky Crafts, MD;  Location: [facility] CATH LAB;  Service: Cardiovascular;  Laterality: N/A;  . LUMBAR DISC SURGERY  1990  . REVERSE SHOULDER ARTHROPLASTY Left 12/03/2013   Procedure: LEFT SHOULDER REVERSE ARTHROPLASTY;  Surgeon: Verlee Rossetti, MD;  Location: [facility] OR;  Service: Orthopedics;  Laterality: Left;  . SHOULDER HEMI-ARTHROPLASTY  12/25/2010   Procedure: SHOULDER HEMI-ARTHROPLASTY;  Surgeon: Cammy Copa;  Location: [facility] OR;  Service: Orthopedics;  Laterality: Right;    There were no vitals filed for this visit.   Subjective Assessment - 01/12/20 1151    Subjective  About the same - regarding dropping items in left hand  Patient is accompanied by: Family member    Pertinent History CVA w/ Lt hemiplegia 11/23/19. PMH: previous CVA w/ no residual deficits, DM, PE, HTN, HLD, anxiety. P has walked w/ cane for about a year when outside    Limitations fall risk    Currently in Pain? Yes    Pain Score 7     Pain Location Finger (Comment which one)    Pain Orientation Left    Pain Descriptors / Indicators Sore    Pain Type Acute pain    Pain Onset 1 to 4 weeks ago    Pain Frequency Constant    Aggravating Factors  spasm in left thumb    Pain Relieving Factors unsure                        OT Treatments/Exercises (OP) - 01/12/20 0001      ADLs   ADL Comments Reviewed OT goals with patient and husband.        Exercises   Exercises Hand      Hand Exercises    Other Hand Exercises Set up fine motor HEP.  See patient instructions.  Patient needed cueing frequently for proper technique, and to reduce speed and increase accuracy.  Patient indicates it is not likely that she will do HEP at home.  I am not one for doing exercise.  Patient encouraged to consider hand activity (vs exercise) daily to help reduce dropping.      Other Hand Exercises Grooved pegboard - patient initially frustrated, but with increased time did well.  Mild discomfort along thumb, radial aspect of hand with effort.        Visual/Perceptual Exercises   Other Exercises Spot it game to address visual scannin, attention to visual detail, and focused attention in minimally dostratcing environment.  Patient did well with this task.        Modalities   Modalities Fluidotherapy      LUE Fluidotherapy   Number Minutes Fluidotherapy 10 Minutes    LUE Fluidotherapy Location Hand;Wrist;Forearm    Comments For pain relief after exercise                  OT Education - 01/12/20 1226    Education Details Initiated HEP - Coordination LUE    Person(s) Educated Patient;Spouse    Methods Explanation;Demonstration;Tactile cues;Verbal cues;Handout    Comprehension Verbalized understanding;Returned demonstration;Verbal cues required;Need further instruction               OT Long Term Goals - 01/12/20 1154      OT LONG TERM GOAL #1   Title Independent with coordination HEP for Lt hand    Time 4    Period Weeks    Status On-going      OT LONG TERM GOAL #2   Title Pt to report less drops from Lt hand throughout day    Time 4    Period Weeks    Status On-going      OT LONG TERM GOAL #3   Title Pt to improve coordination as evidenced by performing 9 hole peg test in 45 sec or less    Baseline 57.63 sec    Time 4    Period Weeks    Status On-going      OT LONG TERM GOAL #4   Title Pt to verbalize understanding with memory strategies and cognitive tips prn    Time 4     Period Weeks  Status On-going      OT LONG TERM GOAL #5   Title Pt to perform simple physical task w/ environmental scanning for dual tasking    Time 4    Period Weeks    Status On-going                 Plan - 01/12/20 1228    Clinical Impression Statement Patient in agreement with OT goals, although not eager to perform HEP.    OT Occupational Profile and History Problem Focused Assessment - Including review of records relating to presenting problem    Occupational performance deficits (Please refer to evaluation for details): IADL's;Leisure    Body Structure / Function / Physical Skills UE functional use;IADL;FMC;Coordination    Cognitive Skills Memory;Problem Solve;Safety Awareness    Rehab Potential Good    Clinical Decision Making Limited treatment options, no task modification necessary    Comorbidities Affecting Occupational Performance: May have comorbidities impacting occupational performance    Modification or Assistance to Complete Evaluation  No modification of tasks or assist necessary to complete eval    OT Frequency 2x / week    OT Duration 4 weeks   plus eval (however anticipate only 2-3 weeks needed)   OT Treatment/Interventions Therapeutic activities;Therapeutic exercise;Cognitive remediation/compensation;Coping strategies training;Functional Mobility Training;Visual/perceptual remediation/compensation;Patient/family education;Neuromuscular education    Plan REVIEW coordination HEP, memory strategies    Consulted and Agree with Plan of Care Patient;Family member/caregiver    Family Member Consulted husband           Patient will benefit from skilled therapeutic intervention in order to improve the following deficits and impairments:   Body Structure / Function / Physical Skills: UE functional use, IADL, FMC, Coordination Cognitive Skills: Memory, Problem Solve, Safety Awareness     Visit Diagnosis: Other lack of coordination  Other symptoms and  signs involving cognitive functions following cerebral infarction  Unsteadiness on feet  Muscle weakness (generalized)    Problem List Patient Active Problem List   Diagnosis Date Noted  . Cerebral amyloid angiopathy (CODE)   . Stroke (HCC) 11/24/2019  . AKI (acute kidney injury) (HCC) 11/24/2019  . Spinal stenosis of lumbar region with neurogenic claudication 10/26/2019  . Abnormality of gait 08/02/2019  . Cerebrovascular accident (CVA) (HCC) 07/22/2019  . Dizziness 06/15/2019  . Brain aneurysm 06/15/2019  . Chronic pain syndrome 04/29/2019  . Chronic right-sided low back pain without sciatica 04/12/2019  . Arthritis of shoulder region, left, degenerative 12/03/2013  . Nonspecific abnormal electrocardiogram (ECG) (EKG) 10/15/2013  . Other and unspecified angina pectoris 10/15/2013  . TIA (transient ischemic attack) 11/15/2011  . HTN (hypertension) 11/15/2011  . Diabetes mellitus (HCC) 11/15/2011  . Hypothyroidism 11/15/2011  . Dyslipidemia 11/15/2011  . GERD (gastroesophageal reflux disease) 11/15/2011  . Pyuria 11/15/2011    Collier Salina, OTR/L 01/12/2020, 12:29 PM  [facility] [address] [place], [place], [address] Phone: [phone]   Fax:  [phone]  Name: Hannah Fischer MRN: [id number] Date of Birth: May 16, 1932

## 2020-01-12 NOTE — Patient Instructions (Signed)
Access Code: [email or web address] URL: [email or web address] Date: 01/12/2020 Prepared by: Sherlie Ban  Exercises Standing Tandem Balance with Counter Support - 1 x daily - 5 x weekly - 3 sets - 20 hold Standing Marching - 2 x daily - 5 x weekly - 3 reps Standing Single Leg Stance with Counter Support - 2 x daily - 5 x weekly - 3 sets - 10 hold Standing Balance with Eyes Closed on Foam - 2 x daily - 5 x weekly - 3 sets - 30 hold

## 2020-01-19 ENCOUNTER — Emergency Department (HOSPITAL_COMMUNITY): Payer: Medicare Other

## 2020-01-19 ENCOUNTER — Emergency Department (HOSPITAL_BASED_OUTPATIENT_CLINIC_OR_DEPARTMENT_OTHER): Payer: Medicare Other

## 2020-01-19 ENCOUNTER — Emergency Department (HOSPITAL_COMMUNITY)
Admission: EM | Admit: 2020-01-19 | Discharge: 2020-01-19 | Disposition: A | Discharge status: Home / Self Care | Payer: Medicare Other | Attending: Emergency Medicine | Admitting: Emergency Medicine

## 2020-01-19 ENCOUNTER — Other Ambulatory Visit: Payer: Self-pay

## 2020-01-19 ENCOUNTER — Ambulatory Visit: Payer: Medicare Other | Admitting: Neurology

## 2020-01-19 ENCOUNTER — Encounter (HOSPITAL_COMMUNITY): Payer: Self-pay

## 2020-01-19 ENCOUNTER — Encounter: Payer: Self-pay | Admitting: Neurology

## 2020-01-19 ENCOUNTER — Ambulatory Visit: Payer: Medicare Other

## 2020-01-19 VITALS — BP 192/82 | HR 67 | Ht 63.0 in | Wt 176.5 lb

## 2020-01-19 DIAGNOSIS — R0602 Shortness of breath: Secondary | ICD-10-CM | POA: Diagnosis not present

## 2020-01-19 DIAGNOSIS — I10 Essential (primary) hypertension: Secondary | ICD-10-CM | POA: Insufficient documentation

## 2020-01-19 DIAGNOSIS — Z8673 Personal history of transient ischemic attack (TIA), and cerebral infarction without residual deficits: Secondary | ICD-10-CM | POA: Insufficient documentation

## 2020-01-19 DIAGNOSIS — R519 Headache, unspecified: Secondary | ICD-10-CM | POA: Diagnosis not present

## 2020-01-19 DIAGNOSIS — E119 Type 2 diabetes mellitus without complications: Secondary | ICD-10-CM | POA: Diagnosis not present

## 2020-01-19 DIAGNOSIS — Z7984 Long term (current) use of oral hypoglycemic drugs: Secondary | ICD-10-CM | POA: Diagnosis not present

## 2020-01-19 DIAGNOSIS — R5383 Other fatigue: Secondary | ICD-10-CM | POA: Diagnosis not present

## 2020-01-19 DIAGNOSIS — Z86711 Personal history of pulmonary embolism: Secondary | ICD-10-CM

## 2020-01-19 DIAGNOSIS — R269 Unspecified abnormalities of gait and mobility: Secondary | ICD-10-CM

## 2020-01-19 DIAGNOSIS — Z7902 Long term (current) use of antithrombotics/antiplatelets: Secondary | ICD-10-CM | POA: Insufficient documentation

## 2020-01-19 DIAGNOSIS — R0789 Other chest pain: Secondary | ICD-10-CM | POA: Insufficient documentation

## 2020-01-19 DIAGNOSIS — E039 Hypothyroidism, unspecified: Secondary | ICD-10-CM | POA: Diagnosis not present

## 2020-01-19 DIAGNOSIS — J9811 Atelectasis: Secondary | ICD-10-CM | POA: Diagnosis not present

## 2020-01-19 DIAGNOSIS — I639 Cerebral infarction, unspecified: Secondary | ICD-10-CM | POA: Diagnosis not present

## 2020-01-19 DIAGNOSIS — Z79899 Other long term (current) drug therapy: Secondary | ICD-10-CM | POA: Insufficient documentation

## 2020-01-19 DIAGNOSIS — M48062 Spinal stenosis, lumbar region with neurogenic claudication: Secondary | ICD-10-CM

## 2020-01-19 DIAGNOSIS — R079 Chest pain, unspecified: Secondary | ICD-10-CM | POA: Diagnosis not present

## 2020-01-19 LAB — BASIC METABOLIC PANEL
Anion gap: 11 (ref 5–15)
BUN: 16 mg/dL (ref 8–23)
CO2: 26 mmol/L (ref 22–32)
Calcium: 9.3 mg/dL (ref 8.9–10.3)
Chloride: 103 mmol/L (ref 98–111)
Creatinine, Ser: 1.08 mg/dL — ABNORMAL HIGH (ref 0.44–1.00)
GFR, Estimated: 50 mL/min — ABNORMAL LOW (ref >60)
Glucose, Bld: 211 mg/dL — ABNORMAL HIGH (ref 70–99)
Potassium: 3.6 mmol/L (ref 3.5–5.1)
Sodium: 140 mmol/L (ref 135–145)

## 2020-01-19 LAB — CBC
HCT: 39 % (ref 36.0–46.0)
Hemoglobin: 12.2 g/dL (ref 12.0–15.0)
MCH: 31.6 pg (ref 26.0–34.0)
MCHC: 31.3 g/dL (ref 30.0–36.0)
MCV: 101 fL — ABNORMAL HIGH (ref 80.0–100.0)
Platelets: 193 10*3/uL (ref 150–400)
RBC: 3.86 MIL/uL — ABNORMAL LOW (ref 3.87–5.11)
RDW: 13.1 % (ref 11.5–15.5)
WBC: 5 10*3/uL (ref 4.0–10.5)
nRBC: 0 % (ref 0.0–0.2)

## 2020-01-19 LAB — TROPONIN I (HIGH SENSITIVITY)
Troponin I (High Sensitivity): 9 ng/L (ref <18)
Troponin I (High Sensitivity): 8 ng/L (ref <18)

## 2020-01-19 LAB — D-DIMER, QUANTITATIVE: D-Dimer, Quant: 1.09 ug/mL-FEU — ABNORMAL HIGH (ref 0.00–0.50)

## 2020-01-19 MED ORDER — IOHEXOL 300 MG/ML  SOLN
69.0000 mL | Freq: Once | INTRAMUSCULAR | Status: AC | PRN
  Administered 2020-01-19: 69 mL via INTRAVENOUS

## 2020-01-19 NOTE — Progress Notes (Signed)
Lower extremity venous RT study completed.  Preliminary results relayed to [place], [place].   See CV Proc for preliminary results report.   Hortence Rosenthal, RDMS

## 2020-01-19 NOTE — Progress Notes (Signed)
PATIENT: Hannah Fischer DOB: Feb 18, 1932  Chief Complaint  Patient presents with  . Cerebrovascular Accident    Reports having another stroke in 11/2019. She is here, with her husband, to follow up from this event. She has continued to take Plavix 66m daily. She is going to outpatient PT. She is still having some left-sided weakness.      HISTORICAL  Hannah THORNBERRYis a 86year old female, seen in request by her primary care doctor MKathyrn Lassfor evaluation of dizziness, initial evaluation was on Jun 15, 2019,  I have reviewed and summarized the referring note from the referring physician.  She has past medical history of hypothyroidism, on supplement, diabetes, hypertension, she had a history of bilateral shoulder and knee replacement, ambulate with mild difficulty, using cane for longer distance, she enjoys bowling with her husband on a weekly basis  Since October 2020, she began to notice intermittent dizziness, she is a poor historian, she describes dizziness woozy headedness when getting up from seated down position, which has been present frequently, almost daily basis, in addition, she complains of transient spinning sensation when lying down from sitting position.  Today she has significant orthostatic blood pressure changes, blood pressure lying down 207/73, heart rate of 58, sitting up 204/84, heart rate of 49, standing up 158/89, heart rate of 55,  She has intermittent bilateral feet paresthesia, complains of low back pain,  I personally reviewed MRI of lumbar in June 2018: Degenerative changes of lumbar spine, with severe central canal stenosis at L4-5, marked with narrowing in the subarticular recess, worse on the right, marked convex right scoliosis  MRI of the brain in 2013, generalized atrophy, supratentorium small vessel disease  MRA of the brain, 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  Laboratory evaluation in 2021, CBC showed hemoglobin of 12, CMP showed glucose of 120, creatinine of 0.94,  UPDATE Sept 21 2021: She is accompanied by her husband at today's clinical visit, her symptoms overall has much improved, she no longer have significant positional related dizziness, no vertigo  Today's blood pressure has improved as well, sitting down 137/75, 52; standing up 138/69, heart rate of 58  She was seen by Dr. SMarlou Porchin May 2021 for evaluation of shortness of breath with exertion, atypical chest pain, echocardiogram showed normal ejection fraction,  No significant abnormality on perfusion study  She denies a history of stroke, I personally reviewed MRA of neck with without contrast in June 2021, no significant bilateral internal carotid artery stenosis, left vertebral artery is dominant, right vertebral artery hypoplasia, several focus of atherosclerotic irregularity,  MRI of the brain showed chronic left parieto-occipital ischemic infarction, this is new since previous scan in 2013, numerous chronic cerebral microhemorrhage, also foci of sulcal hemosiderin depolarization in the right frontal left parietal region, she is now on Plavix 75 mg daily  Laboratory evaluations in May 2021, mild elevated A1c 6.9, otherwise normal or negative TSH, CPK, RPR, B12, ANA, ESR, C-reactive protein, vitamin D level  She did have a history of chronic low back pain, over the years, has developed gradual onset bilateral ankle weakness, mild unsteady gait  I personally reviewed MRI lumbar in December 2015, some progression of degenerative disease at L4-5 where there is moderately severe central canal stenosis and marked narrowing in the subarticular recesses, worse on the right, due to a broad-based central  protrusion and bulky ligamentum flavum thickening. Advanced facet degenerative disease at this level results in 0.5  cm anterolisthesis.  No marked change in mild to moderate central canal and bilateral foraminal narrowing L3-4.  UPDATE Jan 19 2020: She is accompanied by her husband at today's clinical visit, she was admitted to the hospital on November 23, 2019, for acute onset left upper extremity, mild numbness, left hand numbness, weakness,  I personally reviewed MRI of the brain, evidence of small acute stroke involving right frontal lobe, left parietal, and right occipital lobe. There was also evidence of chronic stable infarction at the left parietal occipital, with associated chronic blood product, right anterior temporal encephalomalacia.  Supratentorial and periventricular small vessel disease, prominent atrophy  MRA of brain and neck showed intracranial atherosclerotic disease,  There was no significant bilateral internal carotid artery stenosis, possible stenosis at right vertebral origin,  Echocardiogram October 20 showed ejection fraction of 60 to 65%,  She was discharged home with Plavix 75 mg daily  Today she complains of difficulty breathing, chest pressure since this morning, I worry about the possibility of cardiac etiology, suggested emergency room visit, also offered to call ambulance, patient and husband decided to go to urgent care, direction was provided.  REVIEW OF SYSTEMS: Full 14 system review of systems performed and notable only for as above All other review of systems were negative.  ALLERGIES: Allergies  Allergen Reactions  . Codeine Anxiety  . Sulfites Other (See Comments)    Abdominal pain  . Zocor [Simvastatin] Other (See Comments)    Muscle weakness    HOME MEDICATIONS: Current Outpatient Medications  Medication Sig Dispense Refill  . Cholecalciferol (VITAMIN D3) 2000 UNITS TABS Take 2,000 Units by mouth daily.     . clopidogrel (PLAVIX) 75 MG tablet Take 1 tablet (75 mg total) by mouth daily with breakfast. 30 tablet 1  . Cyanocobalamin (VITAMIN B-12 PO)  Take 1 tablet by mouth daily.    . DULoxetine (CYMBALTA) 30 MG capsule TAKE 1 CAPSULE(30 MG) BY MOUTH DAILY (Patient taking differently: Take 30 mg by mouth daily.) 30 capsule 2  . levothyroxine (SYNTHROID, LEVOTHROID) 100 MCG tablet Take 1 tablet (100 mcg total) by mouth daily. 30 tablet 1  . losartan (COZAAR) 100 MG tablet Take 100 mg by mouth daily.    . methocarbamol (ROBAXIN) 500 MG tablet Take 1 tablet (500 mg total) by mouth every 8 (eight) hours as needed for muscle spasms. 90 tablet 1  . Omega-3 Fatty Acids (FISH OIL) 1000 MG CAPS Take 1 capsule by mouth 2 (two) times daily.     . pioglitazone (ACTOS) 45 MG tablet Take 45 mg by mouth daily.    . propranolol (INDERAL) 40 MG tablet Take 40 mg by mouth daily.   1  . rosuvastatin (CRESTOR) 5 MG tablet Take 1 tablet (5 mg total) by mouth 2 (two) times a week for 30 doses. 30 tablet 0  . traZODone (DESYREL) 50 MG tablet TAKE 1 TABLET BY MOUTH AT BEDTIME (Patient taking differently: Take 50 mg by mouth at bedtime.) 90 tablet 0   No current facility-administered medications for this visit.    PAST MEDICAL HISTORY: Past Medical History:  Diagnosis Date  . Abnormal nuclear stress test    2005, normal cath Dr. Acie Fredrickson, normal stress test 2012 Dr. Irish Lack  . Anemia    chronic  . Anxiety   . Arthritis    djd  . Arthritis of shoulder region, left, degenerative 12/03/2013  .  Blood transfusion 1979   for anemia  . Chest pain    evaluated @ [facility] cardiology; had a stress test 2012  . Chronic pain syndrome 04/29/2019  . Chronic right-sided low back pain without sciatica 04/12/2019  . Complication of anesthesia    unable to breathe lying  flat on back  . Diabetes mellitus    type 2  niddm x 25 yrs  . Diabetes mellitus ([place]) 11/15/2011  . Diverticular disease   . DJD (degenerative joint disease)    lumbar and cervical  . GERD (gastroesophageal reflux disease)   . Hyperlipemia   . Hypertension    on medication  . Hypothyroidism    takes  levoxyl  . Irritable bowel syndrome   . 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 Neuromuscular disorder ([place])   . Neuropathy in diabetes ([place])   . Nonspecific abnormal electrocardiogram (ECG) (EKG) 10/15/2013  . Other and unspecified angina pectoris    tests came back NOT heart related  . Pulmonary embolism ([place]) 06-2010   bilateral  . Pyuria 11/15/2011  . Seizures ([place])    once, age 31  . TIA (transient ischemic attack) 11/15/2011    PAST SURGICAL HISTORY: Past Surgical History:  Procedure Laterality Date  . BACK SURGERY    . BLEPHAROPLASTY  2012   rt eye  . CARDIAC CATHETERIZATION     had one 10/2013  . CHOLECYSTECTOMY    . EYE SURGERY  2007   cataract ext/ iol rt eye  . JOINT REPLACEMENT  2001,2005   both knees  . LEFT HEART CATHETERIZATION WITH CORONARY ANGIOGRAM N/A 10/18/2013   Procedure: LEFT HEART CATHETERIZATION WITH CORONARY ANGIOGRAM;  Surgeon: Jettie Booze, MD;  Location: [facility] CATH LAB;  Service: Cardiovascular;  Laterality: N/A;  . LUMBAR [place]  . REVERSE SHOULDER ARTHROPLASTY Left 12/03/2013   Procedure: LEFT SHOULDER REVERSE ARTHROPLASTY;  Surgeon: Augustin Schooling, MD;  Location: [place];  Service: Orthopedics;  Laterality: Left;  . SHOULDER HEMI-ARTHROPLASTY  12/25/2010   Procedure: SHOULDER HEMI-ARTHROPLASTY;  Surgeon: Meredith Pel;  Location: [place];  Service: Orthopedics;  Laterality: Right;    FAMILY HISTORY: Family History  Problem Relation Age of Onset  . Diabetes Mother   . Heart disease Mother   . Heart disease Sister   . Heart disease Brother     SOCIAL HISTORY: Social History   Socioeconomic History  . Marital status: Married    Spouse name: Not on file  . Number of children: Not on file  . Years of education: Not on file  . Highest education level: Not on file  Occupational History  . Not on file  Tobacco Use  . Smoking status: Never Smoker  . Smokeless tobacco: Never Used  Substance and Sexual Activity   . Alcohol use: No  . Drug use: No  . Sexual activity: Never    Birth control/protection: Post-menopausal  Other Topics Concern  . Not on file  Social History Narrative  . Not on file   Social Determinants of Health   Financial Resource Strain: Not on file  Food Insecurity: Not on file  Transportation Needs: Not on file  Physical Activity: Not on file  Stress: Not on file  Social Connections: Not on file  Intimate Partner Violence: Not on file     PHYSICAL EXAM   Vitals:   01/19/20 0738  BP: (!) 192/82  Pulse: 67  Weight: 176 lb 8 oz (80.1  kg)  Height: 5' 3"  (1.6 m)   Not recorded    PHYSICAL EXAMNIATION:  Gen: NAD, conversant, well nourised, well groomed                     Cardiovascular: Regular rate rhythm, no peripheral edema, warm, nontender. Eyes: Conjunctivae clear without exudates or hemorrhage Neck: Supple, no carotid bruits. Pulmonary: Clear to auscultation bilaterally   NEUROLOGICAL EXAM:  MENTAL STATUS: Speech/cognition: Awake alert oriented to history taking and care of conversation  CRANIAL NERVES: CN II: Visual fields are full to confrontation. Pupils are round equal and briskly reactive to light. CN III, IV, VI: extraocular movement are normal. No ptosis. CN V: Facial sensation is intact to light touch CN VII: Face is symmetric with normal eye closure  CN VIII: Hearing is normal to causal conversation. CN IX, X: Phonation is normal. CN XI: Head turning and shoulder shrug are intact  MOTOR: She has mild bilateral ankle dorsiflexion weakness, left worse than right  REFLEXES: Reflexes are hypoactive and symmetric at the biceps, triceps, knees, and ankles. Plantar responses are flexor.  SENSORY: Intact to light touch, pinprick and vibratory sensation are intact in fingers and toes.  COORDINATION: There is no trunk or limb dysmetria noted.  GAIT/STANCE: She needs push-up to get up from seated position, mildly antalgic, difficulty clear  floor bilaterally Romberg is absent.   DIAGNOSTIC DATA (LABS, IMAGING, TESTING) - I reviewed patient records, labs, notes, testing and imaging myself where available.   ASSESSMENT AND PLAN  Hannah Fischer is a 87 y.o. female   Stroke  She has vascular risk factor of hypertension, hyperlipidemia,  Continue Plavix 75 mg daily,  Gradual onset gait abnormality,  Exam showed bilateral distal ankle weakness, left worse than right,  MRI of lumbar spine in 2018 showed moderate severe central canal stenosis L4-5, marked narrowing in the subarticular recess, worse on the right side   She does complains of gradual worsening gait abnormality, urinary urgency, occasionally incontinence, but denies significant low back pain, does not want to consider further evaluation at this point  New onset chest pressure, difficulty breathing,  Worried about the possibility of cardiac etiology, I have suggested her to go to emergency room, versus call ambulance,  Patient's and her husband decided to go to urgent care, direction was provided  Marcial Pacas, M.D. Ph.D.  [facility] [address] [place], [place] [address] Ph: [phone] Fax: [phone]  CC: [name], MD

## 2020-01-19 NOTE — Discharge Instructions (Addendum)
You were evaluated in the emergency department today for your chest pain or shortness of breath.  Your physical exam was very reassuring as were your laboratory studies, chest x-ray, EKG, and ultrasound of your legs.  There is no sign of blood clot in your legs

## 2020-01-19 NOTE — ED Triage Notes (Signed)
Pt reports sudden onset of chest tightness and sob while sitting in her doctors office. Pt was at a follow up visit from a prior stroke she had. Resp e.u, nad noted.

## 2020-01-19 NOTE — ED Notes (Signed)
Got patient in a gown on the monitor patient is resting with call bell in reach got patient a warm blanket

## 2020-01-19 NOTE — ED Provider Notes (Signed)
Care assumed from [facility], PA-C, at shift change, please see their notes for full documentation of patient's complaint/HPI. Briefly, pt here with chest tightness and SOB. Results so far show clear CXR, negative DVT study, and negative troponin x 2. Awaiting CTA to rule out PE; pt has history of same however is unable to be anticoagulated due to history of amyloidosis. Plan is to dispo accordingy. If negative pt can be discharged home with cardiology follow up.    Physical Exam  BP 129/80   Pulse 84   Temp 98.4 F (36.9 C) (Oral)   Resp (!) 23   SpO2 100%   Physical Exam Vitals and nursing note reviewed.  Constitutional:      Appearance: She is not ill-appearing.  HENT:     Head: Normocephalic and atraumatic.  Eyes:     Conjunctiva/sclera: Conjunctivae normal.  Cardiovascular:     Rate and Rhythm: Normal rate and regular rhythm.  Pulmonary:     Effort: Pulmonary effort is normal.     Breath sounds: Normal breath sounds.  Skin:    General: Skin is warm and dry.     Coloration: Skin is not jaundiced.  Neurological:     Mental Status: She is alert.     ED Course/Procedures     Procedures  Results for orders placed or performed during the hospital encounter of 01/19/20  Basic metabolic panel  Result Value Ref Range   Sodium 140 135 - 145 mmol/L   Potassium 3.6 3.5 - 5.1 mmol/L   Chloride 103 98 - 111 mmol/L   CO2 26 22 - 32 mmol/L   Glucose, Bld 211 (H) 70 - 99 mg/dL   BUN 16 8 - 23 mg/dL   Creatinine, Ser 4.23 (H) 0.44 - 1.00 mg/dL   Calcium 9.3 8.9 - 53.6 mg/dL   GFR, Estimated 50 (L) >60 mL/min   Anion gap 11 5 - 15  CBC  Result Value Ref Range   WBC 5.0 4.0 - 10.5 K/uL   RBC 3.86 (L) 3.87 - 5.11 MIL/uL   Hemoglobin 12.2 12.0 - 15.0 g/dL   HCT 14.4 31.5 - 40.0 %   MCV 101.0 (H) 80.0 - 100.0 fL   MCH 31.6 26.0 - 34.0 pg   MCHC 31.3 30.0 - 36.0 g/dL   RDW 86.7 61.9 - 50.9 %   Platelets 193 150 - 400 K/uL   nRBC 0.0 0.0 - 0.2 %  D-dimer, quantitative  (not at [facility])  Result Value Ref Range   D-Dimer, Quant 1.09 (H) 0.00 - 0.50 ug/mL-FEU  Troponin I (High Sensitivity)  Result Value Ref Range   Troponin I (High Sensitivity) 9 <18 ng/L  Troponin I (High Sensitivity)  Result Value Ref Range   Troponin I (High Sensitivity) 8 <18 ng/L   DG Chest 2 View  Result Date: 01/19/2020 CLINICAL DATA:  Chest pain, sudden onset of chest tightness and shortness of breath while sitting and doctor's office EXAM: CHEST - 2 VIEW COMPARISON:  03/15/2019 FINDINGS: Normal heart size, mediastinal contours, and pulmonary vascularity. Atherosclerotic calcification aorta. Eventration of anterior RIGHT diaphragm and central peribronchial thickening again seen. No acute infiltrate, pleural effusion, or pneumothorax. Bones demineralized with BILATERAL shoulder prostheses noted. IMPRESSION: Chronic bronchitic changes without infiltrate. Aortic Atherosclerosis (ICD10-I70.0). Electronically Signed   By: Ulyses Southward M.D.   On: 01/19/2020 08:59   CT Angio Chest PE W/Cm &/Or Wo Cm  Result Date: 01/19/2020 CLINICAL DATA:  PE suspected, high prob chest pain. EXAM:  CT ANGIOGRAPHY CHEST WITH CONTRAST TECHNIQUE: Multidetector CT imaging of the chest was performed using the standard protocol during bolus administration of intravenous contrast. Multiplanar CT image reconstructions and MIPs were obtained to evaluate the vascular anatomy. CONTRAST:  26mL OMNIPAQUE IOHEXOL 300 MG/ML  SOLN COMPARISON:  Radiograph earlier today.  Chest CT a 03/15/2019 FINDINGS: Cardiovascular: There are no filling defects within the pulmonary arteries to suggest pulmonary embolus. Chronic fatigue a shin of the right main pulmonary artery at 3.6 cm. Heart is normal in size. Mitral annulus calcifications. Aortic atherosclerosis without aneurysm or evidence of dissection. There are coronary artery calcifications. No pericardial effusion. Mediastinum/Nodes: No enlarged mediastinal or hilar lymph nodes. No axillary  adenopathy. No suspicious thyroid nodule. Improved esophageal wall thickening from prior. Lungs/Pleura: No acute or focal airspace disease. Mild eventration of right hemidiaphragm. Chronic atelectasis the lower most right lung base. No pleural fluid or findings of pulmonary edema. There is mild central bronchial thickening. Upper Abdomen: Cholecystectomy. Dilatation of the right renal pelvis is improved from prior exam. Nonobstructing stone in the lower right kidney. Colonic diverticulosis without acute inflammation. Musculoskeletal: Multilevel degenerative change throughout the spine. Bilateral shoulder arthroplasties. Review of the MIP images confirms the above findings. IMPRESSION: 1. No pulmonary embolus. 2. Chronically enlarged right pulmonary artery. 3. Mild central bronchial thickening, can be seen with bronchitis or reactive airways disease. Chronic atelectasis in the lower most right lung base. 4. Coronary artery calcifications. Aortic Atherosclerosis (ICD10-I70.0). Electronically Signed   By: Narda Rutherford M.D.   On: 01/19/2020 19:19   VAS [place] LOWER EXTREMITY VENOUS (DVT) (ONLY [facility] & [facility] 7a-7p)  Result Date: 01/19/2020  Lower Venous DVT Study Indications: Sudden onset shortness of breath, pain behind RT knee, history of PE.  Comparison Study: No prior studies available. Performing Technologist: Hanadi Rosenthal RDMS  Examination Guidelines: A complete evaluation includes B-mode imaging, spectral Doppler, color Doppler, and power Doppler as needed of all accessible portions of each vessel. Bilateral testing is considered an integral part of a complete examination. Limited examinations for reoccurring indications may be performed as noted. The reflux portion of the exam is performed with the patient in reverse Trendelenburg.  +---------+---------------+---------+-----------+----------+--------------+ RIGHT    CompressibilityPhasicitySpontaneityPropertiesThrombus Aging  +---------+---------------+---------+-----------+----------+--------------+ CFV      Full           Yes      Yes                                 +---------+---------------+---------+-----------+----------+--------------+ SFJ      Full                                                        +---------+---------------+---------+-----------+----------+--------------+ FV Prox  Full                                                        +---------+---------------+---------+-----------+----------+--------------+ FV Mid   Full                                                        +---------+---------------+---------+-----------+----------+--------------+  FV DistalFull                                                        +---------+---------------+---------+-----------+----------+--------------+ PFV      Full                                                        +---------+---------------+---------+-----------+----------+--------------+ POP      Full           Yes      Yes                                 +---------+---------------+---------+-----------+----------+--------------+ PTV      Full                                                        +---------+---------------+---------+-----------+----------+--------------+ PERO     Full                                                        +---------+---------------+---------+-----------+----------+--------------+   +----+---------------+---------+-----------+----------+--------------+ LEFTCompressibilityPhasicitySpontaneityPropertiesThrombus Aging +----+---------------+---------+-----------+----------+--------------+ CFV Full           Yes      Yes                                 +----+---------------+---------+-----------+----------+--------------+     Summary: RIGHT: - There is no evidence of deep vein thrombosis in the lower extremity.  - A cystic structure is found in the popliteal fossa.   LEFT: - No evidence of common femoral vein obstruction.  *See table(s) above for measurements and observations.    Preliminary     MDM  CT scan negative for PE. On reevaluation pt resting comfortably and states she is ready to go home. Will discharge at this time with close cardiology follow up. Pt is in agreement with plan and stable for discharge.   This note was prepared using Dragon voice recognition software and may include unintentional dictation errors due to the inherent limitations of voice recognition software.      Tanda Rockers, PA-C 01/19/20 1929    Gerhard Munch, MD 01/19/20 2227

## 2020-01-19 NOTE — ED Notes (Signed)
Walked patient to the bathroom patient did well 

## 2020-01-19 NOTE — ED Provider Notes (Signed)
[facility] EMERGENCY DEPARTMENT Provider Note   CSN: [id number] Arrival date & time: 01/19/20  5885     History Chief Complaint  Patient presents with  . Chest Pain  . Shortness of Breath    Hannah Fischer is a 86 y.o. female with history of CVA, HTN, DM, presents with concern for central chest pressure, tightness, and shortness of breath that started this morning while she was sitting in a chair at her neurologist office.  She states she is never experienced anything like this before.  She was sent to the emergency department by her neurologist for evaluation of possible cardiac event.  At the time of my initial evaluation patient she is resting comfortably in her hospital bed, states that her chest pressure has nearly completely resolved and she is no longer feeling short of breath.  She denies nausea, vomiting.  She does endorse some headache but denies blurry vision, double vision, dizziness, lightheadedness, fall.   Patient endorses history of pulmonary embolism in the past, history of TIA and CVA, now on Plavix.  Patient follows with neurology for episodes of dizziness, as well as cardiology. I have personally reviewed this patient's medical records.  She has history of TIA,Cerebral amyloid angiopathy, CVA, diabetes, PE, anemia, anxiety, hypertension, hyperlipidemia.  Echocardiogram 11/24/2019 revealed left ventricular hypertrophy with LVEF 60 to 65%, as well as mild thickening of the aortic valve/aortic stenosis.  HPI  HPI: A 86 year old patient with a history of CVA, treated diabetes, hypertension, hypercholesterolemia and obesity presents for evaluation of chest pain. Initial onset of pain was less than one hour ago. The patient's chest pain is described as heaviness/pressure/tightness and is not worse with exertion. The patient's chest pain is middle- or left-sided, is not well-localized, is not sharp and does not radiate to the arms/jaw/neck. The patient does  not complain of nausea and denies diaphoresis. The patient has no history of peripheral artery disease, has not smoked in the past 90 days and has no relevant family history of coronary artery disease (first degree relative at less than age 28).   Past Medical History:  Diagnosis Date  . Abnormal nuclear stress test    2005, normal cath Dr. Elease Hashimoto, normal stress test 2012 Dr. Eldridge Dace  . Anemia    chronic  . Anxiety   . Arthritis    djd  . Arthritis of shoulder region, left, degenerative 12/03/2013  . Blood transfusion 1979   for anemia  . Chest pain    evaluated @ [facility] cardiology; had a stress test 2012  . Chronic pain syndrome 04/29/2019  . Chronic right-sided low back pain without sciatica 04/12/2019  . Complication of anesthesia    unable to breathe lying  flat on back  . Diabetes mellitus    type 2  niddm x 25 yrs  . Diabetes mellitus (HCC) 11/15/2011  . Diverticular disease   . DJD (degenerative joint disease)    lumbar and cervical  . GERD (gastroesophageal reflux disease)   . Hyperlipemia   . Hypertension    on medication  . Hypothyroidism    takes levoxyl  . Irritable bowel syndrome   . 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 Neuromuscular disorder (HCC)   . Neuropathy in diabetes (HCC)   . Nonspecific abnormal electrocardiogram (ECG) (EKG) 10/15/2013  . Other and unspecified angina pectoris    tests came back NOT heart related  . Pulmonary embolism (HCC) 06-2010   bilateral  .  Pyuria 11/15/2011  . Seizures (HCC)    once, age 13  . TIA (transient ischemic attack) 11/15/2011    Patient Active Problem List   Diagnosis Date Noted  . Gait abnormality 01/19/2020  . Chest pressure 01/19/2020  . Cerebral amyloid angiopathy (CODE)   . Stroke (HCC) 11/24/2019  . AKI (acute kidney injury) (HCC) 11/24/2019  . Spinal stenosis of lumbar region with neurogenic claudication 10/26/2019  . Abnormality of gait 08/02/2019  . Cerebrovascular accident (CVA) (HCC)  07/22/2019  . Dizziness 06/15/2019  . Brain aneurysm 06/15/2019  . Chronic pain syndrome 04/29/2019  . Chronic right-sided low back pain without sciatica 04/12/2019  . Arthritis of shoulder region, left, degenerative 12/03/2013  . Nonspecific abnormal electrocardiogram (ECG) (EKG) 10/15/2013  . Other and unspecified angina pectoris 10/15/2013  . TIA (transient ischemic attack) 11/15/2011  . HTN (hypertension) 11/15/2011  . Diabetes mellitus (HCC) 11/15/2011  . Hypothyroidism 11/15/2011  . Dyslipidemia 11/15/2011  . GERD (gastroesophageal reflux disease) 11/15/2011  . Pyuria 11/15/2011    Past Surgical History:  Procedure Laterality Date  . BACK SURGERY    . BLEPHAROPLASTY  2012   rt eye  . CARDIAC CATHETERIZATION     had one 10/2013  . CHOLECYSTECTOMY    . EYE SURGERY  2007   cataract ext/ iol rt eye  . JOINT REPLACEMENT  2001,2005   both knees  . LEFT HEART CATHETERIZATION WITH CORONARY ANGIOGRAM N/A 10/18/2013   Procedure: LEFT HEART CATHETERIZATION WITH CORONARY ANGIOGRAM;  Surgeon: Corky Crafts, MD;  Location: [facility] CATH LAB;  Service: Cardiovascular;  Laterality: N/A;  . LUMBAR DISC SURGERY  1990  . REVERSE SHOULDER ARTHROPLASTY Left 12/03/2013   Procedure: LEFT SHOULDER REVERSE ARTHROPLASTY;  Surgeon: Verlee Rossetti, MD;  Location: [facility] OR;  Service: Orthopedics;  Laterality: Left;  . SHOULDER HEMI-ARTHROPLASTY  12/25/2010   Procedure: SHOULDER HEMI-ARTHROPLASTY;  Surgeon: Cammy Copa;  Location: [facility] OR;  Service: Orthopedics;  Laterality: Right;     OB History   No obstetric history on file.     Family History  Problem Relation Age of Onset  . Diabetes Mother   . Heart disease Mother   . Heart disease Sister   . Heart disease Brother     Social History   Tobacco Use  . Smoking status: Never Smoker  . Smokeless tobacco: Never Used  Substance Use Topics  . Alcohol use: No  . Drug use: No    Home Medications Prior to Admission medications    Medication Sig Start Date End Date Taking? Authorizing Provider  Cholecalciferol (VITAMIN D3) 2000 UNITS TABS Take 2,000 Units by mouth daily.     [provider]  clopidogrel (PLAVIX) 75 MG tablet Take 1 tablet (75 mg total) by mouth daily with breakfast. 11/17/11   Vassie Loll, MD  Cyanocobalamin (VITAMIN B-12 PO) Take 1 tablet by mouth daily.    [provider]  DULoxetine (CYMBALTA) 30 MG capsule TAKE 1 CAPSULE(30 MG) BY MOUTH DAILY Patient taking differently: Take 30 mg by mouth daily. 08/25/19   Marcello Fennel, MD  levothyroxine (SYNTHROID, LEVOTHROID) 100 MCG tablet Take 1 tablet (100 mcg total) by mouth daily. 11/17/11   Vassie Loll, MD  losartan (COZAAR) 100 MG tablet Take 100 mg by mouth daily. 04/29/19   [provider]  methocarbamol (ROBAXIN) 500 MG tablet Take 1 tablet (500 mg total) by mouth every 8 (eight) hours as needed for muscle spasms. 04/13/19   Marcello Fennel, MD  Omega-3 Fatty Acids (FISH OIL) 1000 MG CAPS Take 1 capsule by mouth 2 (two) times daily.     [provider]  pioglitazone (ACTOS) 45 MG tablet Take 45 mg by mouth daily.    [provider]  propranolol (INDERAL) 40 MG tablet Take 40 mg by mouth daily.  04/13/17   [provider]  rosuvastatin (CRESTOR) 5 MG tablet Take 1 tablet (5 mg total) by mouth 2 (two) times a week for 30 doses. 11/29/19 03/10/20  Azucena Fallen, MD  traZODone (DESYREL) 50 MG tablet TAKE 1 TABLET BY MOUTH AT BEDTIME Patient taking differently: Take 50 mg by mouth at bedtime. 07/01/17   Marcello Fennel, MD    Allergies    Codeine, Sulfites, and Zocor [simvastatin]  Review of Systems   Review of Systems  Constitutional: Negative for activity change, appetite change, chills, fatigue and fever.  HENT: Negative.   Eyes: Negative.   Respiratory: Positive for chest tightness and shortness of breath. Negative for wheezing.   Cardiovascular: Positive for chest pain. Negative  for palpitations and leg swelling.  Gastrointestinal: Negative for abdominal pain, diarrhea, nausea and vomiting.  Endocrine: Negative.   Genitourinary: Negative for dysuria, frequency and urgency.  Musculoskeletal: Negative.   Skin: Negative.   Allergic/Immunologic: Positive for immunocompromised state.       DMt2  Neurological: Positive for headaches. Negative for dizziness, syncope, facial asymmetry, speech difficulty, weakness and light-headedness.  Hematological: Bruises/bleeds easily.       On Plavix  Psychiatric/Behavioral: Negative.     Physical Exam Updated Vital Signs BP 129/80   Pulse 84   Temp 98.4 F (36.9 C) (Oral)   Resp (!) 23   SpO2 100%   Physical Exam Vitals and nursing note reviewed.  HENT:     Head: Normocephalic and atraumatic.     Nose: Nose normal.     Mouth/Throat:     Mouth: Mucous membranes are moist.     Pharynx: Oropharynx is clear. Uvula midline. No oropharyngeal exudate or posterior oropharyngeal erythema.     Tonsils: No tonsillar exudate.  Eyes:     General:        Right eye: No discharge.        Left eye: No discharge.     Extraocular Movements: Extraocular movements intact.     Conjunctiva/sclera: Conjunctivae normal.     Pupils: Pupils are equal, round, and reactive to light.  Neck:     Vascular: No hepatojugular reflux.     Trachea: Trachea and phonation normal.  Cardiovascular:     Rate and Rhythm: Normal rate and regular rhythm.     Pulses:          Radial pulses are 2+ on the right side and 2+ on the left side.       Dorsalis pedis pulses are 1+ on the right side and 1+ on the left side.     Heart sounds: Murmur heard.   Systolic murmur is present with a grade of 2/6.   Pulmonary:     Effort: Pulmonary effort is normal. No tachypnea or respiratory distress.     Breath sounds: Normal breath sounds. No wheezing or rales.  Chest:     Chest wall: No lacerations, deformity, swelling, tenderness, crepitus or edema.     Abdominal:     General: Bowel sounds are normal. There is no distension.     Palpations: Abdomen is soft.     Tenderness: There is no abdominal tenderness.  Musculoskeletal:        General: No deformity.     Cervical back: Neck supple.     Right lower leg: Tenderness present. No edema.     Left lower leg: No edema.  Lymphadenopathy:     Cervical: No cervical adenopathy.  Skin:    General: Skin is warm and dry.     Capillary Refill: Capillary refill takes less than 2 seconds.  Neurological:     General: No focal deficit present.     Mental Status: She is alert and oriented to person, place, and time. Mental status is at baseline.  Psychiatric:        Mood and Affect: Mood normal.     ED Results / Procedures / Treatments   Labs (all labs ordered are listed, but only abnormal results are displayed) Labs Reviewed  BASIC METABOLIC PANEL - Abnormal; Notable for the following components:      Result Value   Glucose, Bld 211 (*)    Creatinine, Ser 1.08 (*)    GFR, Estimated 50 (*)    All other components within normal limits  CBC - Abnormal; Notable for the following components:   RBC 3.86 (*)    MCV 101.0 (*)    All other components within normal limits  D-DIMER, QUANTITATIVE (NOT AT [facility]) - Abnormal; Notable for the following components:   D-Dimer, Quant 1.09 (*)    All other components within normal limits  TROPONIN I (HIGH SENSITIVITY)  TROPONIN I (HIGH SENSITIVITY)    EKG EKG Interpretation  Date/Time:  Wednesday January 19 2020 08:41:39 EST Ventricular Rate:  61 PR Interval:    QRS Duration: 102 QT Interval:  438 QTC Calculation: 440 R Axis:   -24 Text Interpretation: Sinus rhythm with marked sinus arrhythmia Minimal voltage criteria for LVH, may be normal variant ( Cornell product ) Anteroseptal infarct , age undetermined Abnormal ECG No acute changes No significant change since last tracing no clear AV block Confirmed by Derwood Kaplan [phone]) on  01/19/2020 4:17:26 PM   Radiology DG Chest 2 View  Result Date: 01/19/2020 CLINICAL DATA:  Chest pain, sudden onset of chest tightness and shortness of breath while sitting and doctor's office EXAM: CHEST - 2 VIEW COMPARISON:  03/15/2019 FINDINGS: Normal heart size, mediastinal contours, and pulmonary vascularity. Atherosclerotic calcification aorta. Eventration of anterior RIGHT diaphragm and central peribronchial thickening again seen. No acute infiltrate, pleural effusion, or pneumothorax. Bones demineralized with BILATERAL shoulder prostheses noted. IMPRESSION: Chronic bronchitic changes without infiltrate. Aortic Atherosclerosis (ICD10-I70.0). Electronically Signed   By: Ulyses Southward M.D.   On: 01/19/2020 08:59   VAS [place] LOWER EXTREMITY VENOUS (DVT) (ONLY [facility] & [facility] 7a-7p)  Result Date: 01/19/2020  Lower Venous DVT Study Indications: Sudden onset shortness of breath, pain behind RT knee, history of PE.  Comparison Study: No prior studies available. Performing Technologist: Yianna Rosenthal RDMS  Examination Guidelines: A complete evaluation includes B-mode imaging, spectral Doppler, color Doppler, and power Doppler as needed of all accessible portions of each vessel. Bilateral testing is considered an integral part of a complete examination. Limited examinations for reoccurring indications may be performed as noted. The reflux portion of the exam is performed with the patient in reverse Trendelenburg.  +---------+---------------+---------+-----------+----------+--------------+ RIGHT    CompressibilityPhasicitySpontaneityPropertiesThrombus Aging +---------+---------------+---------+-----------+----------+--------------+ CFV      Full           Yes      Yes                                 +---------+---------------+---------+-----------+----------+--------------+  SFJ      Full                                                         +---------+---------------+---------+-----------+----------+--------------+ FV Prox  Full                                                        +---------+---------------+---------+-----------+----------+--------------+ FV Mid   Full                                                        +---------+---------------+---------+-----------+----------+--------------+ FV DistalFull                                                        +---------+---------------+---------+-----------+----------+--------------+ PFV      Full                                                        +---------+---------------+---------+-----------+----------+--------------+ POP      Full           Yes      Yes                                 +---------+---------------+---------+-----------+----------+--------------+ PTV      Full                                                        +---------+---------------+---------+-----------+----------+--------------+ PERO     Full                                                        +---------+---------------+---------+-----------+----------+--------------+   +----+---------------+---------+-----------+----------+--------------+ LEFTCompressibilityPhasicitySpontaneityPropertiesThrombus Aging +----+---------------+---------+-----------+----------+--------------+ CFV Full           Yes      Yes                                 +----+---------------+---------+-----------+----------+--------------+     Summary: RIGHT: - There is no evidence of deep vein thrombosis in the lower extremity.  - A cystic structure is found in the popliteal fossa.  LEFT: - No evidence of common femoral vein obstruction.  *See table(s) above for measurements and observations.    Preliminary  Procedures Procedures (including critical care time)  Medications Ordered in ED Medications - No data to display  ED Course  I have reviewed the triage vital signs  and the nursing notes.  Pertinent labs & imaging results that were available during my care of the patient were reviewed by me and considered in my medical decision making (see chart for details).    MDM Rules/Calculators/A&P HEAR Score: 58                       86 year old patient with history of CVA, PE, hypertension, cerebral amyloid angiopathy who presents with concern for sudden onset central chest pressure and shortness of breath this morning while she was under neurology office.  Presented to the emergency department for evaluation of possible cardiac etiology.  Patient is not on any anticoagulation, due to cerebral amyloid angiopathy.  She is on Plavix.  Differential diagnosis for this patient's chest tightness and shortness of breath includes but is not limited to ACS, pulmonary embolism, pneumonia, arrhythmia, pulmonary edema, anemia, pleuritis.   Patient hypertensive on intake to 196/91.  Vital signs otherwise normal.  Patient is not tachycardic.  At the time of my initial examination of the patient she is resting comfortably in her hospital bed.  She reports that her central chest pressure has nearly completely resolved, she is no longer feeling short of breath.  Cardiac exam significant for systolic murmur, with regular rate and rhythm.  Pulmonary exam is benign.  Abdominal exam normal.  Tenderness palpation of the right calf/ posterior knee. Will proceed with DVT study.  Laboratory studies performed in triage as well as chest x-ray and EKG.  CBC unremarkable, BMP with mildly elevated creatinine to 1.08, at patient baseline.  Initial troponin negative, 9.  Delta troponin negative, 8.  Chest x-ray significant for chronic bronchitic changes, aortic atherosclerosis.  EKG with sinus rhythm with marked sinus arrhythmia, no significant change since last tracing.  No STEMI.  We will proceed with venous Doppler of the right lower extremity given calf tenderness palpation in context of  patient with history of coagulopathy.  Case discussed with attending physician who recommends deferring CT angiogram at this time proceeding with D-dimer.  Lower extremity venous Doppler negative for DVT bilaterally, there is a cystic structure in the right popliteal fossa.  Age-adjusted D-dimer remains elevated, 1.09.  Will proceed with CT angiogram of the chest for PE study at this time.  CTA pending at time of shift change.  Patient signed out to oncoming ED provider, Tanda Rockers, PA-C.  Physical exam and laboratory studies were discussed with oncoming provider, as well as disposition plan pending CTA results. I appreciate her collaboration care of this patient.  Recommend close cardiology follow-up outpatient.  Final Clinical Impression(s) / ED Diagnoses Final diagnoses:  Shortness of breath    Rx / DC Orders ED Discharge Orders    None       Sherrilee Gilles 01/19/20 1854    Derwood Kaplan, MD 01/20/20 1103

## 2020-01-21 ENCOUNTER — Other Ambulatory Visit: Payer: Self-pay

## 2020-01-21 ENCOUNTER — Encounter: Payer: Self-pay | Admitting: Cardiology

## 2020-01-21 ENCOUNTER — Ambulatory Visit: Payer: Medicare Other | Admitting: Cardiology

## 2020-01-21 VITALS — BP 124/60 | HR 60 | Ht 63.0 in | Wt 173.0 lb

## 2020-01-21 DIAGNOSIS — I2584 Coronary atherosclerosis due to calcified coronary lesion: Secondary | ICD-10-CM

## 2020-01-21 DIAGNOSIS — I2510 Atherosclerotic heart disease of native coronary artery without angina pectoris: Secondary | ICD-10-CM

## 2020-01-21 DIAGNOSIS — I700 Atherosclerosis of aorta: Secondary | ICD-10-CM

## 2020-01-21 DIAGNOSIS — G459 Transient cerebral ischemic attack, unspecified: Secondary | ICD-10-CM

## 2020-01-21 NOTE — Progress Notes (Signed)
Cardiology Office Note:    Date:  01/21/2020   ID:  Hannah Fischer, DOB February 03, 1933, MRN [id number]  PCP:  [name], MD  [facility] Cardiologist:  Candee Furbish, MD  [facility] Electrophysiologist:  None   Referring MD: [name], MD     History of Present Illness:    Hannah Fischer is a 86 y.o. female here for the follow-up of chest pain shortness of breath.  Nuclear stress test 2012: Low risk no ischemia Cardiac catheterization 2005: Normal per Dr. Acie Fredrickson Echo 2018-EF 60 to 40% with diastolic dysfunction and mild mitral regurgitation Stress test again was normal in 2021 Echo 2021 also as below.  Normal EF.  Covid January 2021.  Had sensation of anxiety hard to breathe with exertion.  Chest CT lung fields appeared clear.  Coronary artery calcification noted as well as MAC.  Pulmonary artery mildly dilated.  Aortic atherosclerosis.  Overall she states that she is doing well.  No complaints of her.  Her husband, also a patient of mine, did ask about chest pain and shortness of breath.  We discussed possibilities of musculoskeletal as well as deconditioning.  Reassuring cardiac work-up.  I am glad that she is taking her Crestor.  She is not having any muscle weakness.    Past Medical History:  Diagnosis Date  . Abnormal nuclear stress test    2005, normal cath Dr. Acie Fredrickson, normal stress test 2012 Dr. Irish Lack  . Anemia    chronic  . Anxiety   . Arthritis    djd  . Arthritis of shoulder region, left, degenerative 12/03/2013  . Blood transfusion 1979   for anemia  . Chest pain    evaluated @ [facility] cardiology; had a stress test 2012  . Chronic pain syndrome 04/29/2019  . Chronic right-sided low back pain without sciatica 04/12/2019  . Complication of anesthesia    unable to breathe lying  flat on back  . Diabetes mellitus    type 2  niddm x 25 yrs  . Diabetes mellitus ([place]) 11/15/2011  . Diverticular disease   . DJD (degenerative joint disease)    lumbar  and cervical  . GERD (gastroesophageal reflux disease)   . Hyperlipemia   . Hypertension    on medication  . Hypothyroidism    takes levoxyl  . Irritable bowel syndrome   . 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 Neuromuscular disorder ([place])   . Neuropathy in diabetes ([place])   . Nonspecific abnormal electrocardiogram (ECG) (EKG) 10/15/2013  . Other and unspecified angina pectoris    tests came back NOT heart related  . Pulmonary embolism ([place]) 06-2010   bilateral  . Pyuria 11/15/2011  . Seizures ([place])    once, age 20  . TIA (transient ischemic attack) 11/15/2011    Past Surgical History:  Procedure Laterality Date  . BACK SURGERY    . BLEPHAROPLASTY  2012   rt eye  . CARDIAC CATHETERIZATION     had one 10/2013  . CHOLECYSTECTOMY    . EYE SURGERY  2007   cataract ext/ iol rt eye  . JOINT REPLACEMENT  2001,2005   both knees  . LEFT HEART CATHETERIZATION WITH CORONARY ANGIOGRAM N/A 10/18/2013   Procedure: LEFT HEART CATHETERIZATION WITH CORONARY ANGIOGRAM;  Surgeon: Jettie Booze, MD;  Location: [facility] CATH LAB;  Service: Cardiovascular;  Laterality: N/A;  . LUMBAR [place]  . REVERSE SHOULDER ARTHROPLASTY Left 12/03/2013   Procedure:  LEFT SHOULDER REVERSE ARTHROPLASTY;  Surgeon: Augustin Schooling, MD;  Location: [place];  Service: Orthopedics;  Laterality: Left;  . SHOULDER HEMI-ARTHROPLASTY  12/25/2010   Procedure: SHOULDER HEMI-ARTHROPLASTY;  Surgeon: Meredith Pel;  Location: [place];  Service: Orthopedics;  Laterality: Right;    Current Medications: Current Meds  Medication Sig  . amLODipine (NORVASC) 5 MG tablet Take 5 mg by mouth every morning.  . Cholecalciferol (VITAMIN D3) 2000 UNITS TABS Take 2,000 Units by mouth daily.   . clopidogrel (PLAVIX) 75 MG tablet Take 1 tablet (75 mg total) by mouth daily with breakfast.  . Cyanocobalamin (VITAMIN B-12 PO) Take 1 tablet by mouth daily.  . DULoxetine (CYMBALTA) 30 MG capsule TAKE 1 CAPSULE(30 MG) BY  MOUTH DAILY  . levothyroxine (SYNTHROID, LEVOTHROID) 100 MCG tablet Take 1 tablet (100 mcg total) by mouth daily.  Marland Kitchen losartan (COZAAR) 100 MG tablet Take 100 mg by mouth daily.  . methocarbamol (ROBAXIN) 500 MG tablet Take 1 tablet (500 mg total) by mouth every 8 (eight) hours as needed for muscle spasms.  . Omega-3 Fatty Acids (FISH OIL) 1000 MG CAPS Take 1 capsule by mouth 2 (two) times daily.   . pioglitazone (ACTOS) 45 MG tablet Take 45 mg by mouth daily.  . propranolol (INDERAL) 40 MG tablet Take 40 mg by mouth daily.   . rosuvastatin (CRESTOR) 5 MG tablet Take 1 tablet (5 mg total) by mouth 2 (two) times a week for 30 doses.  . traZODone (DESYREL) 50 MG tablet TAKE 1 TABLET BY MOUTH AT BEDTIME     Allergies:   Codeine, Sulfites, and Zocor [simvastatin]   Social History   Socioeconomic History  . Marital status: Married    Spouse name: Not on file  . Number of children: Not on file  . Years of education: Not on file  . Highest education level: Not on file  Occupational History  . Not on file  Tobacco Use  . Smoking status: Never Smoker  . Smokeless tobacco: Never Used  Substance and Sexual Activity  . Alcohol use: No  . Drug use: No  . Sexual activity: Never    Birth control/protection: Post-menopausal  Other Topics Concern  . Not on file  Social History Narrative  . Not on file   Social Determinants of Health   Financial Resource Strain: Not on file  Food Insecurity: Not on file  Transportation Needs: Not on file  Physical Activity: Not on file  Stress: Not on file  Social Connections: Not on file     Family History: The patient's family history includes Diabetes in her mother; Heart disease in her brother, mother, and sister.  ROS:   Please see the history of present illness.     All other systems reviewed and are negative.  EKGs/Labs/Other Studies Reviewed:    The following studies were reviewed today:  ECHO 11/24/19:  1. Left ventricular ejection  fraction, by estimation, is 60 to 65%. The  left ventricle has normal function. The left ventricle has no regional  wall motion abnormalities. There is mild concentric left ventricular  hypertrophy. Left ventricular diastolic  parameters are consistent with Grade II diastolic dysfunction  (pseudonormalization). Elevated left ventricular end-diastolic pressure.  2. Right ventricular systolic function is normal. The right ventricular  size is normal.  3. Left atrial size was moderately dilated.  4. The mitral valve is normal in structure. Trivial mitral valve  regurgitation. Moderate to severe mitral annular calcification.  5. The aortic  valve is tricuspid. There is mild calcification of the  aortic valve. There is mild thickening of the aortic valve. Aortic valve  regurgitation is not visualized. Mild aortic valve stenosis.  6. The inferior vena cava is normal in size with greater than 50%  respiratory variability, suggesting right atrial pressure of 3 mmHg.   Comparison(s): No significant change from prior study.   Nuclear stress test 06/01/2019:  The left ventricular ejection fraction is hyperdynamic (>65%).  Nuclear stress EF: 68%.  There was no ST segment deviation noted during stress.  The study is normal.  This is a low risk study.  No ischemia.  Reassuring stress test  EKG:  EKG is  ordered today.  Sinus rhythm with sinus arrhythmia.  Recent Labs: 06/15/2019: TSH 2.760 11/23/2019: ALT 9 01/19/2020: BUN 16; Creatinine, Ser 1.08; Hemoglobin 12.2; Platelets 193; Potassium 3.6; Sodium 140  Recent Lipid Panel    Component Value Date/Time   CHOL 212 (H) 11/24/2019 1400   TRIG 130 11/24/2019 1400   HDL 58 11/24/2019 1400   CHOLHDL 3.7 11/24/2019 1400   VLDL 26 11/24/2019 1400   LDLCALC 128 (H) 11/24/2019 1400     Risk Assessment/Calculations:       Physical Exam:    VS:  BP 124/60 (BP Location: Left Arm, Patient Position: Sitting, Cuff Size: Normal)    Pulse 60   Ht _0  (1.6 m)   Wt 173 lb (78.5 kg)   SpO2 97%   BMI 30.65 kg/m     Wt Readings from Last 3 Encounters:  01/21/20 173 lb (78.5 kg)  01/19/20 176 lb 8 oz (80.1 kg)  11/23/19 177 lb (80.3 kg)     GEN:  Well nourished, well developed in no acute distress HEENT: Normal NECK: No JVD; No carotid bruits LYMPHATICS: No lymphadenopathy CARDIAC: RRR, 2/6 SM, rubs, gallops RESPIRATORY:  Clear to auscultation without rales, wheezing or rhonchi  ABDOMEN: Soft, non-tender, non-distended MUSCULOSKELETAL:  No edema; No deformity  SKIN: Warm and dry NEUROLOGIC:  Alert and oriented x 3 PSYCHIATRIC:  Normal affect   ASSESSMENT:    1. Coronary artery calcification   2. TIA (transient ischemic attack)   3. Aortic atherosclerosis ([place])    PLAN:    In order of problems listed above:  Aortic atherosclerosis/coronary artery calcification -Encourage statin use. -Has had difficulty in the past with these medications.  Hesitant. -Currently on Plavix 75 a day.  Atypical chest pain and shortness of breath -Echocardiogram and stress test from 2021 were reassuring.  Described other possibilities for chest pain such as musculoskeletal.  Some shortness of breath could certainly be from deconditioning.  Encouraged movement, being careful not to fall.  Mild aortic stenosis -Murmur heard on exam.  Should not be of any significant clinical consequence at this time.  Continue to monitor clinically.  Diabetes with hypertension -Dr. Sabra Heck has been monitoring closely.  Hyperlipidemia -Doing very well with Crestor twice a week.  No myalgias.  Previously had some issues with simvastatin.  Continuing to monitor.    Medication Adjustments/Labs and Tests Ordered: Current medicines are reviewed at length with the patient today.  Concerns regarding medicines are outlined above.  No orders of the defined types were placed in this encounter.  No orders of the defined types were placed in this  encounter.   Patient Instructions  Medication Instructions:  The current medical regimen is effective;  continue present plan and medications.  *If you need a refill on your cardiac medications before  your next appointment, please call your pharmacy*  Follow-Up: At [facility], you and your health needs are our priority.  As part of our continuing mission to provide you with exceptional heart care, we have created designated Provider Care Teams.  These Care Teams include your primary Cardiologist (physician) and Advanced Practice Providers (APPs -  Physician Assistants and Nurse Practitioners) who all work together to provide you with the care you need, when you need it.  We recommend signing up for the patient portal called "MyChart".  Sign up information is provided on this After Visit Summary.  MyChart is used to connect with patients for Virtual Visits (Telemedicine).  Patients are able to view lab/test results, encounter notes, upcoming appointments, etc.  Non-urgent messages can be sent to your provider as well.   To learn more about what you can do with MyChart, go to [email or web address].    Your next appointment:   12 month(s)  The format for your next appointment:   In Person  Provider:   Candee Furbish, MD   Thank you for choosing [facility]!!         Signed, Candee Furbish, MD  01/21/2020 9:27 AM    [place]

## 2020-01-21 NOTE — Patient Instructions (Signed)
Medication Instructions:  The current medical regimen is effective;  continue present plan and medications.  *If you need a refill on your cardiac medications before your next appointment, please call your pharmacy*  Follow-Up: At [facility], you and your health needs are our priority.  As part of our continuing mission to provide you with exceptional heart care, we have created designated Provider Care Teams.  These Care Teams include your primary Cardiologist (physician) and Advanced Practice Providers (APPs -  Physician Assistants and Nurse Practitioners) who all work together to provide you with the care you need, when you need it.  We recommend signing up for the patient portal called "MyChart".  Sign up information is provided on this After Visit Summary.  MyChart is used to connect with patients for Virtual Visits (Telemedicine).  Patients are able to view lab/test results, encounter notes, upcoming appointments, etc.  Non-urgent messages can be sent to your provider as well.   To learn more about what you can do with MyChart, go to [email or web address].    Your next appointment:   12 month(s)  The format for your next appointment:   In Person  Provider:   Mark Skains, MD   Thank you for choosing [facility]!!      

## 2020-01-25 ENCOUNTER — Ambulatory Visit: Payer: Medicare Other | Admitting: Occupational Therapy

## 2020-01-25 ENCOUNTER — Ambulatory Visit: Payer: Medicare Other

## 2020-01-25 DIAGNOSIS — F419 Anxiety disorder, unspecified: Secondary | ICD-10-CM | POA: Diagnosis not present

## 2020-02-01 ENCOUNTER — Encounter: Payer: Medicare Other | Admitting: Physical Medicine & Rehabilitation

## 2020-02-02 DIAGNOSIS — F419 Anxiety disorder, unspecified: Secondary | ICD-10-CM | POA: Diagnosis not present

## 2020-02-02 DIAGNOSIS — Z03818 Encounter for observation for suspected exposure to other biological agents ruled out: Secondary | ICD-10-CM | POA: Diagnosis not present

## 2020-02-02 DIAGNOSIS — R0989 Other specified symptoms and signs involving the circulatory and respiratory systems: Secondary | ICD-10-CM | POA: Diagnosis not present

## 2020-02-02 DIAGNOSIS — Z20822 Contact with and (suspected) exposure to covid-19: Secondary | ICD-10-CM | POA: Diagnosis not present

## 2020-02-03 ENCOUNTER — Other Ambulatory Visit: Payer: Self-pay

## 2020-02-03 ENCOUNTER — Ambulatory Visit: Payer: Medicare Other | Admitting: Occupational Therapy

## 2020-02-03 ENCOUNTER — Ambulatory Visit: Payer: Medicare Other

## 2020-02-03 VITALS — HR 60

## 2020-02-03 DIAGNOSIS — R278 Other lack of coordination: Secondary | ICD-10-CM

## 2020-02-03 DIAGNOSIS — R41841 Cognitive communication deficit: Secondary | ICD-10-CM | POA: Diagnosis not present

## 2020-02-03 DIAGNOSIS — M6281 Muscle weakness (generalized): Secondary | ICD-10-CM

## 2020-02-03 DIAGNOSIS — R2681 Unsteadiness on feet: Secondary | ICD-10-CM | POA: Diagnosis not present

## 2020-02-03 DIAGNOSIS — R2689 Other abnormalities of gait and mobility: Secondary | ICD-10-CM | POA: Diagnosis not present

## 2020-02-03 DIAGNOSIS — R42 Dizziness and giddiness: Secondary | ICD-10-CM | POA: Diagnosis not present

## 2020-02-03 DIAGNOSIS — I69318 Other symptoms and signs involving cognitive functions following cerebral infarction: Secondary | ICD-10-CM

## 2020-02-03 NOTE — Therapy (Signed)
[facility] [address] [place], [place], [address] Phone: [phone]   Fax:  [phone]  Physical Therapy Treatment  Patient Details  Name: Hannah Fischer MRN: [id number] Date of Birth: 1932-11-04 Referring Provider (PT): Azucena Fallen, MD (will be followed by Dr. Terrace Arabia)   Encounter Date: 02/03/2020   PT End of Session - 02/03/20 1235    Visit Number 3    Number of Visits 13    Date for PT Re-Evaluation 04/04/20   written for 6 week POC   Authorization Type BCBS Medicare    PT Start Time 1230    PT Stop Time 1310   pt requesting to finish early due to fatigue   PT Time Calculation (min) 40 min    Equipment Utilized During Treatment Gait belt    Activity Tolerance Patient tolerated treatment well   limited by dizziness at times   Behavior During Therapy [employer] for tasks assessed/performed           Past Medical History:  Diagnosis Date  . Abnormal nuclear stress test    2005, normal cath Dr. Elease Hashimoto, normal stress test 2012 Dr. Eldridge Dace  . Anemia    chronic  . Anxiety   . Arthritis    djd  . Arthritis of shoulder region, left, degenerative 12/03/2013  . Blood transfusion 1979   for anemia  . Chest pain    evaluated @ [facility] cardiology; had a stress test 2012  . Chronic pain syndrome 04/29/2019  . Chronic right-sided low back pain without sciatica 04/12/2019  . Complication of anesthesia    unable to breathe lying  flat on back  . Diabetes mellitus    type 2  niddm x 25 yrs  . Diabetes mellitus (HCC) 11/15/2011  . Diverticular disease   . DJD (degenerative joint disease)    lumbar and cervical  . GERD (gastroesophageal reflux disease)   . Hyperlipemia   . Hypertension    on medication  . Hypothyroidism    takes levoxyl  . Irritable bowel syndrome   . 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 Neuromuscular disorder (HCC)   . Neuropathy in diabetes (HCC)   . Nonspecific abnormal  electrocardiogram (ECG) (EKG) 10/15/2013  . Other and unspecified angina pectoris    tests came back NOT heart related  . Pulmonary embolism (HCC) 06-2010   bilateral  . Pyuria 11/15/2011  . Seizures (HCC)    once, age 86  . TIA (transient ischemic attack) 11/15/2011    Past Surgical History:  Procedure Laterality Date  . BACK SURGERY    . BLEPHAROPLASTY  2012   rt eye  . CARDIAC CATHETERIZATION     had one 10/2013  . CHOLECYSTECTOMY    . EYE SURGERY  2007   cataract ext/ iol rt eye  . JOINT REPLACEMENT  2001,2005   both knees  . LEFT HEART CATHETERIZATION WITH CORONARY ANGIOGRAM N/A 10/18/2013   Procedure: LEFT HEART CATHETERIZATION WITH CORONARY ANGIOGRAM;  Surgeon: Corky Crafts, MD;  Location: [facility] CATH LAB;  Service: Cardiovascular;  Laterality: N/A;  . LUMBAR DISC SURGERY  1990  . REVERSE SHOULDER ARTHROPLASTY Left 12/03/2013   Procedure: LEFT SHOULDER REVERSE ARTHROPLASTY;  Surgeon: Verlee Rossetti, MD;  Location: [facility] OR;  Service: Orthopedics;  Laterality: Left;  . SHOULDER HEMI-ARTHROPLASTY  12/25/2010   Procedure: SHOULDER HEMI-ARTHROPLASTY;  Surgeon: Cammy Copa;  Location: [facility] OR;  Service: Orthopedics;  Laterality: Right;  Vitals:   02/03/20 1327  Pulse: 60  SpO2: 98%     Subjective Assessment - 02/03/20 1232    Subjective Patient reports that has missed therapy due to chest pain/tightness, still having some episodes. No falls. No episodes of dizziness recently.    Patient is accompained by: Family member   husband   Pertinent History prior CVA without residual deficit, TIA, DM, PE, anemia, anxiety, hypertension, hyperlipidemia    Diagnostic tests MRI + small acute to subacute infarcts involving R frontal, L parietal and R occipital lobes.    Patient Stated Goals wants her L hand to work better    Currently in Pain? No/denies    Pain Onset 1 to 4 weeks ago                [facility] Adult PT Treatment/Exercise - 02/03/20 0001      Transfers    Transfers Sit to Stand;Stand to Sit    Sit to Stand 5: Supervision;Without upper extremity assist;From chair/3-in-1    Five time sit to stand comments  16.5 secs with wide BOS, decreased control with descent.    Stand to Sit 5: Supervision;Without upper extremity assist;To chair/3-in-1      Ambulation/Gait   Ambulation/Gait Yes    Ambulation/Gait Assistance 5: Supervision    Ambulation/Gait Assistance Details throughout therapy gym with activities    Ambulation Distance (Feet) --   clinic distances   Assistive device None    Gait Pattern Step-through pattern;Wide base of support    Ambulation Surface Level;Indoor    Gait velocity 14.72 secs = 2.2 ft/sec      High Level Balance   High Level Balance Activities Negotiating over obstacles    High Level Balance Comments With use of SPC, completed ambulation with stepping over hurdles (4 hurdles) x 2 laps down and back. Intermittent CGA required.      Exercises   Exercises Knee/Hip      Knee/Hip Exercises: Standing   Forward Step Up Both;1 set;15 reps;Hand Hold: 2;Step Height: 4";Limitations    Forward Step Up Limitations completed x 15 reps alternating, increased fatigue at end of completion. Single UE support required.               Balance Exercises - 02/03/20 0001      Balance Exercises: Standing   Standing Eyes Opened Wide (BOA);Head turns;Foam/compliant surface;2 reps;Limitations    Standing Eyes Opened Limitations completed standing on airex with horizontal/vertical head turns 2 x 10 reps each.    Standing Eyes Closed Wide (BOA);Narrow base of support (BOS);Foam/compliant surface;3 reps;30 secs;Limitations    Standing Eyes Closed Limitations completed initially with wide BOS compelted EC x 30 seconds, progress to more narrow BOS 2 x 30 seconds. icnreased sway with vision removed.    Tandem Stance Eyes open;2 reps;30 secs;Limitations    Tandem Stance Time completed alternating stance 2 x 30 seconds each, incrased challenge  with left foot positioned posteriorly    SLS with Vectors Foam/compliant surface;Intermittent upper extremity assist;Limitations    SLS with Vectors Limitations standing on airex completed alternating toe taps to cone x 15 reps each, last 5 reps bilaterally compelted with CGA from PT and no UE support from // bars.    Stepping Strategy Anterior;Posterior;Foam/compliant surface;Limitations    Stepping Strategy Limitations on blue balance beam completed anterior/posterior stepping strategy x 10 reps each direction. Single UE support required due to difficulty completing without UE support.    Tandem Gait Forward;Intermittent upper extremity support;3 reps;Limitations  Tandem Gait Limitations completed tandem gait forwards x 3 laps down and back.               PT Short Term Goals - 02/03/20 1320      PT SHORT TERM GOAL #1   Title Pt will undergo further vestibular assessment with LTG to be written as appropriate. ALL STGS DUE 01/26/20    Baseline per patient reports dizziness seems to be more imbalance    Time 3    Period Weeks    Status Deferred    Target Date 01/26/20      PT SHORT TERM GOAL #2   Title Pt will undergo DGI for further dynamic balance testing with LTG written as appropriate.    Baseline 16/24 on 01/12/20    Time 3    Period Weeks    Status Achieved      PT SHORT TERM GOAL #3   Title Pt will improve gait speed with SPC to at least 1.8 ft/sec in order to demo decr fall risk.    Baseline 1.55 ft/sec with SPC; 2.2 ft/sec    Time 3    Period Weeks    Status Achieved      PT SHORT TERM GOAL #4   Title Pt will decr 5x sit <> stand to 17 seconds or less in order to demo decr fall risk..    Baseline 19.97 seconds; 16.5 secs    Time 3    Period Weeks    Status Achieved             PT Long Term Goals - 01/12/20 1724      PT LONG TERM GOAL #1   Title Pt will be independent with final HEP in order to build upon functional gains made in therapy. ALL LTGS DUE  02/16/20    Time 6    Period Weeks    Status New      PT LONG TERM GOAL #2   Title Pt will improve DGI to at least a 19/24 in order to demo decr fall risk.    Baseline 16/24    Time 6    Period Weeks    Status Revised      PT LONG TERM GOAL #3   Title Pt will decr TUG time with SPC or with no AD to 13.5 seconds or less in order to demo decr fall risk.    Time 6    Period Weeks    Status New      PT LONG TERM GOAL #4   Title Vestibular goal to be written as appropriate after formal vestibular assessment.    Time 6    Period Weeks    Status New      PT LONG TERM GOAL #5   Title Pt will improve D/C FOTO score to a 58 in order to improve functional outcomes.    Baseline FOTO 49    Time 6    Period Weeks    Status New                 Plan - 02/03/20 1322    Clinical Impression Statement Today's skilled PT session included assessment of patient's progress toward all STGs. Patient able to meet STG #2-4 today. Rest of session spent completing balance exercises working toward complaint surface and promoting SLS. Patient require frequent intermittent rest breaks due to fatigue.    Personal Factors and Comorbidities Comorbidity 3+    Comorbidities prior CVA without  residual deficit, TIA, DM, PE, anemia, anxiety, hypertension, hyperlipidemia, B knee replacements, B shoulder replacements    Examination-Activity Limitations Stairs;Locomotion Level    Examination-Participation Restrictions Community Activity    Stability/Clinical Decision Making Evolving/Moderate complexity    Rehab Potential Good    PT Frequency 2x / week    PT Duration 6 weeks    PT Treatment/Interventions ADLs/Self Care Home Management;Canalith Repostioning;DME Instruction;Gait training;Stair training;Functional mobility training;Therapeutic activities;Therapeutic exercise;Neuromuscular re-education;Balance training;Patient/family education;Vestibular    PT Next Visit Plan dizziness seems to be imbalance  related. Continue balance with eyes closed, SLS.    Consulted and Agree with Plan of Care Patient;Family member/caregiver    Family Member Consulted pt's husband           Patient will benefit from skilled therapeutic intervention in order to improve the following deficits and impairments:  Abnormal gait,Decreased balance,Decreased strength,Dizziness,Decreased activity tolerance,Difficulty walking  Visit Diagnosis: Other abnormalities of gait and mobility  Unsteadiness on feet  Muscle weakness (generalized)     Problem List Patient Active Problem List   Diagnosis Date Noted  . Gait abnormality 01/19/2020  . Chest pressure 01/19/2020  . Cerebral amyloid angiopathy (CODE)   . Stroke (HCC) 11/24/2019  . AKI (acute kidney injury) (HCC) 11/24/2019  . Spinal stenosis of lumbar region with neurogenic claudication 10/26/2019  . Abnormality of gait 08/02/2019  . Cerebrovascular accident (CVA) (HCC) 07/22/2019  . Dizziness 06/15/2019  . Brain aneurysm 06/15/2019  . Chronic pain syndrome 04/29/2019  . Chronic right-sided low back pain without sciatica 04/12/2019  . Arthritis of shoulder region, left, degenerative 12/03/2013  . Nonspecific abnormal electrocardiogram (ECG) (EKG) 10/15/2013  . Other and unspecified angina pectoris 10/15/2013  . TIA (transient ischemic attack) 11/15/2011  . HTN (hypertension) 11/15/2011  . Diabetes mellitus (HCC) 11/15/2011  . Hypothyroidism 11/15/2011  . Dyslipidemia 11/15/2011  . GERD (gastroesophageal reflux disease) 11/15/2011  . Pyuria 11/15/2011    Tempie Donning, PT, DPT 02/03/2020, 1:27 PM  [facility] [address] [place], [place], [address] Phone: [phone]   Fax:  [phone]  Name: AMEIRA ALESSANDRINI MRN: [id number] Date of Birth: [birth year]

## 2020-02-03 NOTE — Therapy (Signed)
[facility] [address] [place], [place], [address] Phone: [phone]   Fax:  [phone]  Occupational Therapy Treatment  Patient Details  Name: Hannah Fischer MRN: [id number] Date of Birth: [birth year] No data recorded  Encounter Date: 02/03/2020   OT End of Session - 02/03/20 1224    Visit Number 3    Number of Visits 9    Date for OT Re-Evaluation 02/05/20    Authorization Type BC/BS MCR    Progress Note Due on Visit 10    OT Start Time 1145    OT Stop Time 1230    OT Time Calculation (min) 45 min    Activity Tolerance Patient tolerated treatment well    Behavior During Therapy [facility] for tasks assessed/performed           Past Medical History:  Diagnosis Date  . Abnormal nuclear stress test    2005, normal cath Dr. Elease Hashimoto, normal stress test 2012 Dr. Eldridge Dace  . Anemia    chronic  . Anxiety   . Arthritis    djd  . Arthritis of shoulder region, left, degenerative 12/03/2013  . Blood transfusion 1979   for anemia  . Chest pain    evaluated @ [facility] cardiology; had a stress test 2012  . Chronic pain syndrome 04/29/2019  . Chronic right-sided low back pain without sciatica 04/12/2019  . Complication of anesthesia    unable to breathe lying  flat on back  . Diabetes mellitus    type 2  niddm x 25 yrs  . Diabetes mellitus (HCC) 11/15/2011  . Diverticular disease   . DJD (degenerative joint disease)    lumbar and cervical  . GERD (gastroesophageal reflux disease)   . Hyperlipemia   . Hypertension    on medication  . Hypothyroidism    takes levoxyl  . Irritable bowel syndrome   . 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 Neuromuscular disorder (HCC)   . Neuropathy in diabetes (HCC)   . Nonspecific abnormal electrocardiogram (ECG) (EKG) 10/15/2013  . Other and unspecified angina pectoris    tests came back NOT heart related  . Pulmonary embolism (HCC) 06-2010   bilateral  . Pyuria  11/15/2011  . Seizures (HCC)    once, age 36  . TIA (transient ischemic attack) 11/15/2011    Past Surgical History:  Procedure Laterality Date  . BACK SURGERY    . BLEPHAROPLASTY  2012   rt eye  . CARDIAC CATHETERIZATION     had one 10/2013  . CHOLECYSTECTOMY    . EYE SURGERY  2007   cataract ext/ iol rt eye  . JOINT REPLACEMENT  2001,2005   both knees  . LEFT HEART CATHETERIZATION WITH CORONARY ANGIOGRAM N/A 10/18/2013   Procedure: LEFT HEART CATHETERIZATION WITH CORONARY ANGIOGRAM;  Surgeon: Corky Crafts, MD;  Location: [facility] CATH LAB;  Service: Cardiovascular;  Laterality: N/A;  . LUMBAR DISC SURGERY  1990  . REVERSE SHOULDER ARTHROPLASTY Left 12/03/2013   Procedure: LEFT SHOULDER REVERSE ARTHROPLASTY;  Surgeon: Verlee Rossetti, MD;  Location: [facility] OR;  Service: Orthopedics;  Laterality: Left;  . SHOULDER HEMI-ARTHROPLASTY  12/25/2010   Procedure: SHOULDER HEMI-ARTHROPLASTY;  Surgeon: Cammy Copa;  Location: [facility] OR;  Service: Orthopedics;  Laterality: Right;    There were no vitals filed for this visit.   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 PMH: previous CVA w/ no residual deficits, DM, PE, HTN, HLD, anxiety. P has walked w/ cane for about a year when outside    Limitations fall risk    Currently in Pain? No/denies           Reviewed coordination HEP - pt practiced each ex with min cues.  Issued memory strategies and reviewed.  Practiced buttoning/unbuttoning small buttons of shirt on tabletop.  Copying small peg design using Lt hand with self corrected errors.                      OT Education - 02/03/20 1207    Education Details Memory strategies    Person(s) Educated Patient;Spouse    Methods Explanation;Handout;Verbal cues    Comprehension  Verbalized understanding               OT Long Term Goals - 02/03/20 1225      OT LONG TERM GOAL #1   Title Independent with coordination HEP for Lt hand    Time 4    Period Weeks    Status Achieved      OT LONG TERM GOAL #2   Title Pt to report less drops from Lt hand throughout day    Time 4    Period Weeks    Status On-going      OT LONG TERM GOAL #3   Title Pt to improve coordination as evidenced by performing 9 hole peg test in 45 sec or less    Baseline 57.63 sec    Time 4    Period Weeks    Status On-going      OT LONG TERM GOAL #4   Title Pt to verbalize understanding with memory strategies and cognitive tips prn    Time 4    Period Weeks    Status Achieved   ISSUED 02/03/20     OT LONG TERM GOAL #5   Title Pt to perform simple physical task w/ environmental scanning for dual tasking    Time 4    Period Weeks    Status On-going                 Plan - 02/03/20 1226    Clinical Impression Statement Pt reports slightly less drops from Lt hand. Pt reports difficulty with buttons and peeling potatoes    OT Occupational Profile and History Problem Focused Assessment - Including review of records relating to presenting problem    Occupational performance deficits (Please refer to evaluation for details): IADL's;Leisure    Body Structure / Function / Physical Skills UE functional use;IADL;FMC;Coordination    Cognitive Skills Memory;Problem Solve;Safety Awareness    Rehab Potential Good    Clinical Decision Making Limited treatment options, no task modification necessary    Comorbidities Affecting Occupational Performance: May have comorbidities impacting occupational performance    Modification or Assistance to Complete Evaluation  No modification of tasks or assist necessary to complete eval    OT Frequency 2x / week    OT Duration 4 weeks   plus eval (however anticipate only 2-3 weeks needed)   OT Treatment/Interventions Therapeutic  activities;Therapeutic exercise;Cognitive remediation/compensation;Coping strategies training;Functional Mobility Training;Visual/perceptual remediation/compensation;Patient/family education;Neuromuscular education    Plan continue progress towards remaining LTG's    Consulted and Agree with Plan of Care Patient;Family member/caregiver  Family Member Consulted husband           Patient will benefit from skilled therapeutic intervention in order to improve the following deficits and impairments:   Body Structure / Function / Physical Skills: UE functional use,IADL,FMC,Coordination Cognitive Skills: Memory,Problem Solve,Safety Awareness     Visit Diagnosis: Other lack of coordination  Other symptoms and signs involving cognitive functions following cerebral infarction    Problem List Patient Active Problem List   Diagnosis Date Noted  . Gait abnormality 01/19/2020  . Chest pressure 01/19/2020  . Cerebral amyloid angiopathy (CODE)   . Stroke (HCC) 11/24/2019  . AKI (acute kidney injury) (HCC) 11/24/2019  . Spinal stenosis of lumbar region with neurogenic claudication 10/26/2019  . Abnormality of gait 08/02/2019  . Cerebrovascular accident (CVA) (HCC) 07/22/2019  . Dizziness 06/15/2019  . Brain aneurysm 06/15/2019  . Chronic pain syndrome 04/29/2019  . Chronic right-sided low back pain without sciatica 04/12/2019  . Arthritis of shoulder region, left, degenerative 12/03/2013  . Nonspecific abnormal electrocardiogram (ECG) (EKG) 10/15/2013  . Other and unspecified angina pectoris 10/15/2013  . TIA (transient ischemic attack) 11/15/2011  . HTN (hypertension) 11/15/2011  . Diabetes mellitus (HCC) 11/15/2011  . Hypothyroidism 11/15/2011  . Dyslipidemia 11/15/2011  . GERD (gastroesophageal reflux disease) 11/15/2011  . Pyuria 11/15/2011    Kelli Churn, OTR/L 02/03/2020, 12:28 PM  [facility] [address]  [address] [place], [place], [address] Phone: [phone]   Fax:  [phone]  Name: SIMI BRIEL MRN: [id number] Date of Birth: 02-12-1932

## 2020-02-03 NOTE — Patient Instructions (Signed)

## 2020-02-08 ENCOUNTER — Ambulatory Visit: Payer: Medicare Other | Admitting: Occupational Therapy

## 2020-02-08 ENCOUNTER — Ambulatory Visit: Payer: Medicare Other

## 2020-02-08 ENCOUNTER — Other Ambulatory Visit: Payer: Self-pay

## 2020-02-08 ENCOUNTER — Ambulatory Visit: Payer: Medicare Other | Attending: Internal Medicine | Admitting: Physical Therapy

## 2020-02-08 ENCOUNTER — Encounter: Payer: Self-pay | Admitting: Physical Therapy

## 2020-02-08 DIAGNOSIS — R2681 Unsteadiness on feet: Secondary | ICD-10-CM

## 2020-02-08 DIAGNOSIS — R2689 Other abnormalities of gait and mobility: Secondary | ICD-10-CM | POA: Insufficient documentation

## 2020-02-08 DIAGNOSIS — R41841 Cognitive communication deficit: Secondary | ICD-10-CM | POA: Diagnosis not present

## 2020-02-08 DIAGNOSIS — M6281 Muscle weakness (generalized): Secondary | ICD-10-CM | POA: Diagnosis not present

## 2020-02-08 DIAGNOSIS — Z6831 Body mass index (BMI) 31.0-31.9, adult: Secondary | ICD-10-CM | POA: Diagnosis not present

## 2020-02-08 DIAGNOSIS — R278 Other lack of coordination: Secondary | ICD-10-CM | POA: Diagnosis not present

## 2020-02-08 DIAGNOSIS — I69318 Other symptoms and signs involving cognitive functions following cerebral infarction: Secondary | ICD-10-CM | POA: Insufficient documentation

## 2020-02-08 DIAGNOSIS — F5101 Primary insomnia: Secondary | ICD-10-CM | POA: Diagnosis not present

## 2020-02-08 DIAGNOSIS — E039 Hypothyroidism, unspecified: Secondary | ICD-10-CM | POA: Diagnosis not present

## 2020-02-08 DIAGNOSIS — R269 Unspecified abnormalities of gait and mobility: Secondary | ICD-10-CM | POA: Insufficient documentation

## 2020-02-08 DIAGNOSIS — E1140 Type 2 diabetes mellitus with diabetic neuropathy, unspecified: Secondary | ICD-10-CM | POA: Diagnosis not present

## 2020-02-08 NOTE — Therapy (Signed)
[facility] [address] [place], [place], [address] Phone: [phone]   Fax:  [phone]  Physical Therapy Treatment  Patient Details  Name: Hannah Fischer MRN: [id number] Date of Birth: 12-30-1932 Referring Provider (PT): Little Ishikawa, MD (will be followed by Dr. Krista Blue)   Encounter Date: 02/08/2020   PT End of Session - 02/08/20 1143    Visit Number 4    Number of Visits 13    Date for PT Re-Evaluation 04/04/20   written for 6 week POC   Authorization Type BCBS Medicare    PT Start Time 1102    PT Stop Time 1141    PT Time Calculation (min) 39 min    Equipment Utilized During Treatment Gait belt    Activity Tolerance Patient tolerated treatment well   seated rest breaks throughout   Behavior During Therapy WFL for tasks assessed/performed           Past Medical History:  Diagnosis Date  . Abnormal nuclear stress test    2005, normal cath Dr. Acie Fredrickson, normal stress test 2012 Dr. Irish Lack  . Anemia    chronic  . Anxiety   . Arthritis    djd  . Arthritis of shoulder region, left, degenerative 12/03/2013  . Blood transfusion 1979   for anemia  . Chest pain    evaluated @ [facility] cardiology; had a stress test 2012  . Chronic pain syndrome 04/29/2019  . Chronic right-sided low back pain without sciatica 04/12/2019  . Complication of anesthesia    unable to breathe lying  flat on back  . Diabetes mellitus    type 2  niddm x 25 yrs  . Diabetes mellitus ([place]) 11/15/2011  . Diverticular disease   . DJD (degenerative joint disease)    lumbar and cervical  . GERD (gastroesophageal reflux disease)   . Hyperlipemia   . Hypertension    on medication  . Hypothyroidism    takes levoxyl  . Irritable bowel syndrome   . 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 Neuromuscular disorder ([place])   . Neuropathy in diabetes ([place])   . Nonspecific abnormal electrocardiogram (ECG) (EKG) 10/15/2013  . Other and  unspecified angina pectoris    tests came back NOT heart related  . Pulmonary embolism ([place]) 06-2010   bilateral  . Pyuria 11/15/2011  . Seizures ([place])    once, age 61  . TIA (transient ischemic attack) 11/15/2011    Past Surgical History:  Procedure Laterality Date  . BACK SURGERY    . BLEPHAROPLASTY  2012   rt eye  . CARDIAC CATHETERIZATION     had one 10/2013  . CHOLECYSTECTOMY    . EYE SURGERY  2007   cataract ext/ iol rt eye  . JOINT REPLACEMENT  2001,2005   both knees  . LEFT HEART CATHETERIZATION WITH CORONARY ANGIOGRAM N/A 10/18/2013   Procedure: LEFT HEART CATHETERIZATION WITH CORONARY ANGIOGRAM;  Surgeon: Jettie Booze, MD;  Location[facility] CATH LAB;  Service: Cardiovascular;  Laterality: N/A;  . LUMBAR [place]  . REVERSE SHOULDER ARTHROPLASTY Left 12/03/2013   Procedure: LEFT SHOULDER REVERSE ARTHROPLASTY;  Surgeon: Augustin Schooling, MD;  Location: [place];  Service: Orthopedics;  Laterality: Left;  . SHOULDER HEMI-ARTHROPLASTY  12/25/2010   Procedure: SHOULDER HEMI-ARTHROPLASTY;  Surgeon: Meredith Pel;  Location: [place];  Service: Orthopedics;  Laterality: Right;    There were no vitals filed for this visit.  Subjective Assessment - 02/08/20 1104    Subjective No falls, no other changes since she has been here. Has done the exercises at home a little bit.    Patient is accompained by: Family member   husband   Pertinent History prior CVA without residual deficit, TIA, DM, PE, anemia, anxiety, hypertension, hyperlipidemia    Diagnostic tests MRI + small acute to subacute infarcts involving R frontal, L parietal and R occipital lobes.    Patient Stated Goals wants her L hand to work better    Currently in Pain? No/denies    Pain Onset 1 to 4 weeks ago                                  Balance Exercises - 02/08/20 0001      Balance Exercises: Standing   Standing Eyes Opened Head turns;Foam/compliant surface;Narrow base of  support (BOS)    Standing Eyes Opened Limitations x5 reps and x10 reps head turns, x5 reps and x10 reps head nods - intermittent touch to walls for balance    Standing Eyes Closed Wide (BOA);Narrow base of support (BOS);Foam/compliant surface    Standing Eyes Closed Limitations more narrow BOS eyes closed 3 x 30 seconds, with wide BOS eyes closed x10 reps head turns, x10 reps head nods    Stepping Strategy Lateral;Foam/compliant surface;10 reps;Posterior    Stepping Strategy Limitations on blue air ex, x10 reps B lateral, needing UE support at countertop due to decr foot clearance, on blue mat alternating posterior stepping strategy without UE support x10 reps    Partial Tandem Stance Eyes open;2 reps;30 secs;Other (comment)   on blue air ex in corner, incr difficulty with LLE posteriorly   Retro Gait Foam/compliant surface;3 reps;Limitations    Retro Gait Limitations on blue mat next to countertop, down and back 3 reps, cues for incr step length B, min guard for balance    Sidestepping Foam/compliant support;2 reps;Limitations   on blue foam beam, UE support as needed, cues for tall posture   Other Standing Exercises on blue air ex: x12 reps heel toe raises with fingertip support    Other Standing Exercises Comments on blue foam beam: wide BOS weight shifting pushing off through toes x10 reps B               PT Short Term Goals - 02/03/20 1320      PT SHORT TERM GOAL #1   Title Pt will undergo further vestibular assessment with LTG to be written as appropriate. ALL STGS DUE 01/26/20    Baseline per patient reports dizziness seems to be more imbalance    Time 3    Period Weeks    Status Deferred    Target Date 01/26/20      PT SHORT TERM GOAL #2   Title Pt will undergo DGI for further dynamic balance testing with LTG written as appropriate.    Baseline 16/24 on 01/12/20    Time 3    Period Weeks    Status Achieved      PT SHORT TERM GOAL #3   Title Pt will improve gait speed with  SPC to at least 1.8 ft/sec in order to demo decr fall risk.    Baseline 1.55 ft/sec with SPC; 2.2 ft/sec    Time 3    Period Weeks    Status Achieved      PT SHORT TERM GOAL #4  Title Pt will decr 5x sit <> stand to 17 seconds or less in order to demo decr fall risk..    Baseline 19.97 seconds; 16.5 secs    Time 3    Period Weeks    Status Achieved             PT Long Term Goals - 01/12/20 1724      PT LONG TERM GOAL #1   Title Pt will be independent with final HEP in order to build upon functional gains made in therapy. ALL LTGS DUE 02/16/20    Time 6    Period Weeks    Status New      PT LONG TERM GOAL #2   Title Pt will improve DGI to at least a 19/24 in order to demo decr fall risk.    Baseline 16/24    Time 6    Period Weeks    Status Revised      PT LONG TERM GOAL #3   Title Pt will decr TUG time with SPC or with no AD to 13.5 seconds or less in order to demo decr fall risk.    Time 6    Period Weeks    Status New      PT LONG TERM GOAL #4   Title Vestibular goal to be written as appropriate after formal vestibular assessment.    Time 6    Period Weeks    Status New      PT LONG TERM GOAL #5   Title Pt will improve D/C FOTO score to a 58 in order to improve functional outcomes.    Baseline FOTO 49    Time 6    Period Weeks    Status New                 Plan - 02/08/20 1253    Clinical Impression Statement Today's skilled session continued to focus on balance strategies on compliant surfaces, needing intermittent UE support. Pt needing intermittent seated rest breaks due to fatigue. Will continue to progress towards LTGs.    Personal Factors and Comorbidities Comorbidity 3+    Comorbidities prior CVA without residual deficit, TIA, DM, PE, anemia, anxiety, hypertension, hyperlipidemia, B knee replacements, B shoulder replacements    Examination-Activity Limitations Stairs;Locomotion Level    Examination-Participation Restrictions Community  Activity    Stability/Clinical Decision Making Evolving/Moderate complexity    Rehab Potential Good    PT Frequency 2x / week    PT Duration 6 weeks    PT Treatment/Interventions ADLs/Self Care Home Management;Canalith Repostioning;DME Instruction;Gait training;Stair training;Functional mobility training;Therapeutic activities;Therapeutic exercise;Neuromuscular re-education;Balance training;Patient/family education;Vestibular    PT Next Visit Plan dizziness seems to be imbalance related. Continue balance with eyes closed, SLS.    Consulted and Agree with Plan of Care Patient;Family member/caregiver    Family Member Consulted pt's husband           Patient will benefit from skilled therapeutic intervention in order to improve the following deficits and impairments:  Abnormal gait,Decreased balance,Decreased strength,Dizziness,Decreased activity tolerance,Difficulty walking  Visit Diagnosis: Other lack of coordination  Other symptoms and signs involving cognitive functions following cerebral infarction  Other abnormalities of gait and mobility  Unsteadiness on feet  Muscle weakness (generalized)     Problem List Patient Active Problem List   Diagnosis Date Noted  . Gait abnormality 01/19/2020  . Chest pressure 01/19/2020  . Cerebral amyloid angiopathy (CODE)   . Stroke (HCC) 11/24/2019  . AKI (acute kidney injury) (  HCC) 11/24/2019  . Spinal stenosis of lumbar region with neurogenic claudication 10/26/2019  . Abnormality of gait 08/02/2019  . Cerebrovascular accident (CVA) (HCC) 07/22/2019  . Dizziness 06/15/2019  . Brain aneurysm 06/15/2019  . Chronic pain syndrome 04/29/2019  . Chronic right-sided low back pain without sciatica 04/12/2019  . Arthritis of shoulder region, left, degenerative 12/03/2013  . Nonspecific abnormal electrocardiogram (ECG) (EKG) 10/15/2013  . Other and unspecified angina pectoris 10/15/2013  . TIA (transient ischemic attack) 11/15/2011  . HTN  (hypertension) 11/15/2011  . Diabetes mellitus (HCC) 11/15/2011  . Hypothyroidism 11/15/2011  . Dyslipidemia 11/15/2011  . GERD (gastroesophageal reflux disease) 11/15/2011  . Pyuria 11/15/2011    Catalina Gravel, DPT  02/08/2020, 1:05 PM  [facility] [address] [place], [place], [address] Phone: [phone]   Fax:  [phone]  Name: Hannah Fischer MRN: [id number] Date of Birth: [birth year]

## 2020-02-08 NOTE — Therapy (Signed)
[facility] [address] [place], [place], [address] Phone: [phone]   Fax:  [phone]  Speech Language Pathology Treatment  Patient Details  Name: Hannah Fischer MRN: [id number] Date of Birth: Jun 30, 1932 Referring Provider (SLP): Augustine Radar., MD (referring) Sigmund Hazel, MD (doc)   Encounter Date: 02/08/2020   End of Session - 02/08/20 1358    Visit Number 2    Number of Visits 17    Date for SLP Re-Evaluation 04/04/20    SLP Start Time 1031    SLP Stop Time  1100    SLP Time Calculation (min) 29 min    Activity Tolerance Patient tolerated treatment well           Past Medical History:  Diagnosis Date  . Abnormal nuclear stress test    2005, normal cath Dr. Elease Hashimoto, normal stress test 2012 Dr. Eldridge Dace  . Anemia    chronic  . Anxiety   . Arthritis    djd  . Arthritis of shoulder region, left, degenerative 12/03/2013  . Blood transfusion 1979   for anemia  . Chest pain    evaluated @ [facility] cardiology; had a stress test 2012  . Chronic pain syndrome 04/29/2019  . Chronic right-sided low back pain without sciatica 04/12/2019  . Complication of anesthesia    unable to breathe lying  flat on back  . Diabetes mellitus    type 2  niddm x 25 yrs  . Diabetes mellitus (HCC) 11/15/2011  . Diverticular disease   . DJD (degenerative joint disease)    lumbar and cervical  . GERD (gastroesophageal reflux disease)   . Hyperlipemia   . Hypertension    on medication  . Hypothyroidism    takes levoxyl  . Irritable bowel syndrome   . 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 Neuromuscular disorder (HCC)   . Neuropathy in diabetes (HCC)   . Nonspecific abnormal electrocardiogram (ECG) (EKG) 10/15/2013  . Other and unspecified angina pectoris    tests came back NOT heart related  . Pulmonary embolism (HCC) 06-2010   bilateral  . Pyuria 11/15/2011  . Seizures (HCC)    once, age 14  . TIA (transient  ischemic attack) 11/15/2011    Past Surgical History:  Procedure Laterality Date  . BACK SURGERY    . BLEPHAROPLASTY  2012   rt eye  . CARDIAC CATHETERIZATION     had one 10/2013  . CHOLECYSTECTOMY    . EYE SURGERY  2007   cataract ext/ iol rt eye  . JOINT REPLACEMENT  2001,2005   both knees  . LEFT HEART CATHETERIZATION WITH CORONARY ANGIOGRAM N/A 10/18/2013   Procedure: LEFT HEART CATHETERIZATION WITH CORONARY ANGIOGRAM;  Surgeon: Corky Crafts, MD;  Location: [facility] CATH LAB;  Service: Cardiovascular;  Laterality: N/A;  . LUMBAR DISC SURGERY  1990  . REVERSE SHOULDER ARTHROPLASTY Left 12/03/2013   Procedure: LEFT SHOULDER REVERSE ARTHROPLASTY;  Surgeon: Verlee Rossetti, MD;  Location: [facility] OR;  Service: Orthopedics;  Laterality: Left;  . SHOULDER HEMI-ARTHROPLASTY  12/25/2010   Procedure: SHOULDER HEMI-ARTHROPLASTY;  Surgeon: Cammy Copa;  Location: [facility] OR;  Service: Orthopedics;  Laterality: Right;    There were no vitals filed for this visit.   Subjective Assessment - 02/08/20 1044    Subjective Pt reports no changes since Dec 8th, last visit - skills are at baseline. Arrived 15 minutes late - husband said took 20 minutes to get ice  off of windshield.    Patient is accompained by: Family member   Hannah Fischer -husband   Currently in Pain? No/denies                 ADULT SLP TREATMENT - 02/08/20 1045      General Information   Behavior/Cognition Alert;Cooperative;Pleasant mood      Treatment Provided   Treatment provided Cognitive-Linquistic      Cognitive-Linquistic Treatment   Treatment focused on Cognition    Skilled Treatment Arrived 15 minutes late. SLP provided pt with Self-Administered Gerocognitive Eval (SAGE). Pt abandoned task about figuring quarters in $8.75 and change from $5 for $1.95. She stated she could have figured this out prior to CVA in October. Pt crossed out her workspace on the piece of paper and tossed the pen down saying, "I can't do it.  I'm not going to do it." SLP told pt she could move on if she desired. Additionally, pt noted to have difficulty with alternating attention during the SAGE, taking 10 seconds to find her place after SLP asked her a question during the demographic information portion of the assessment. SLP asked pt with 5 minutes left in session to stop working on the SAGE to ascertain if the quarters task would have been as diffiuclt prior to her hospitalization in October - she stated it would have been easier than it was today. SLP ?s pt's awareness of deficits vs. motivation for change. SLP told pt the organziation and problem solving needed to do the quarters task was a good example of something we oculd work on in [place] to get pt doing more of what she was doing prior to October 2021.      Assessment / Recommendations / Plan   Plan Continue with current plan of care      Progression Toward Goals   Progression toward goals Progressing toward goals   ?awareness of deficits?           SLP Education - 02/08/20 1358    Education Details possible goals    Person(s) Educated Patient;Spouse    Methods Explanation    Comprehension Verbalized understanding            SLP Short Term Goals - 02/08/20 1401      SLP SHORT TERM GOAL #1   Title pt will demonstrate compensations for decr'd comprehension of written or auditory information x 2 sessions (reqeuest repeats, write notes,etc)    Time 4    Period Weeks   or 9 total sessions, for all STGs   Status On-going      SLP SHORT TERM GOAL #2   Title pt will demonstrate understanding of temporal orientation (in order to track appointments) with modified independence x3 sessions    Time 4    Period Weeks    Status On-going      SLP SHORT TERM GOAL #3   Title pt will organize functional items such as grocery list, errands, to-do list (etc) with extra time x2 sessions    Time 4    Period Weeks    Status On-going      SLP SHORT TERM GOAL #4   Title pt will  complete cogntive linguistic testing    Time 2    Period Weeks    Status On-going      SLP SHORT TERM GOAL #5   Title pt will demonstrate selective attention for 7 minutes in min noisy environment to complete a simple-mod complex linguistic task  Time 4    Period Weeks    Status On-going            SLP Long Term Goals - 02/08/20 1401      SLP LONG TERM GOAL #1   Title pt will demonstrate WFL alternating attention for 7 minutes to complete a simple-mod complex linguistic task    Time 8    Period Weeks   or 17 sessions, for all LTGs   Status On-going      SLP LONG TERM GOAL #2   Title pt will demonstrate [facility] organization skills in min-mod complex linguistic task in 3 sessions    Time 8    Period Weeks    Status On-going      SLP LONG TERM GOAL #3   Title pt will demo emergent awareness in therapy tasks 100% with min nonverbal cues in 3 sessions    Time 8    Period Weeks    Status On-going            Plan - 02/08/20 1359    Clinical Impression Statement Hannah Fischer presents today with likely min-mod cognitive communication deficits. However, again the pt denied deficits Today she again demonstrated decr'd attention, awareness, organization, and memory using the SAGE. Pt admitted task/s on this assessment would have been easier prior to her OCtober 2021 hospitalization and SLP told pt she would work with these things in ST. SLP believes pt, if she is willing, would benefit from skilled ST to target cognitive communication deficits.    Speech Therapy Frequency 2x / week    Duration 8 weeks   or 17 total visits   Treatment/Interventions Environmental controls;Compensatory techniques;Functional tasks;SLP instruction and feedback;Cognitive reorganization;Internal/external aids;Patient/family education    Potential to Achieve Goals Fair    Potential Considerations Previous level of function;Cooperation/participation level    Consulted and Agree with Plan of Care Patient            Patient will benefit from skilled therapeutic intervention in order to improve the following deficits and impairments:   Cognitive communication deficit    Problem List Patient Active Problem List   Diagnosis Date Noted  . Gait abnormality 01/19/2020  . Chest pressure 01/19/2020  . Cerebral amyloid angiopathy (CODE)   . Stroke (HCC) 11/24/2019  . AKI (acute kidney injury) (HCC) 11/24/2019  . Spinal stenosis of lumbar region with neurogenic claudication 10/26/2019  . Abnormality of gait 08/02/2019  . Cerebrovascular accident (CVA) (HCC) 07/22/2019  . Dizziness 06/15/2019  . Brain aneurysm 06/15/2019  . Chronic pain syndrome 04/29/2019  . Chronic right-sided low back pain without sciatica 04/12/2019  . Arthritis of shoulder region, left, degenerative 12/03/2013  . Nonspecific abnormal electrocardiogram (ECG) (EKG) 10/15/2013  . Other and unspecified angina pectoris 10/15/2013  . TIA (transient ischemic attack) 11/15/2011  . HTN (hypertension) 11/15/2011  . Diabetes mellitus (HCC) 11/15/2011  . Hypothyroidism 11/15/2011  . Dyslipidemia 11/15/2011  . GERD (gastroesophageal reflux disease) 11/15/2011  . Pyuria 11/15/2011    Hannah Fischer ,MS, CCC-SLP  02/08/2020, 2:02 PM  [facility] [address] [place], [place], [address] Phone: [phone]   Fax:  [phone]   Name: Hannah Fischer MRN: [id number] Date of Birth: August 01, 1932

## 2020-02-10 ENCOUNTER — Ambulatory Visit: Payer: Medicare Other

## 2020-02-10 ENCOUNTER — Ambulatory Visit: Payer: Medicare Other | Admitting: Occupational Therapy

## 2020-02-15 ENCOUNTER — Other Ambulatory Visit: Payer: Self-pay

## 2020-02-15 ENCOUNTER — Ambulatory Visit: Payer: Medicare Other | Admitting: Occupational Therapy

## 2020-02-15 ENCOUNTER — Encounter: Payer: Self-pay | Admitting: Physical Therapy

## 2020-02-15 ENCOUNTER — Ambulatory Visit: Payer: Medicare Other | Admitting: Physical Therapy

## 2020-02-15 ENCOUNTER — Ambulatory Visit: Payer: Medicare Other

## 2020-02-15 VITALS — BP 151/73 | HR 45

## 2020-02-15 DIAGNOSIS — I69318 Other symptoms and signs involving cognitive functions following cerebral infarction: Secondary | ICD-10-CM

## 2020-02-15 DIAGNOSIS — R269 Unspecified abnormalities of gait and mobility: Secondary | ICD-10-CM | POA: Diagnosis not present

## 2020-02-15 DIAGNOSIS — R41841 Cognitive communication deficit: Secondary | ICD-10-CM | POA: Diagnosis not present

## 2020-02-15 DIAGNOSIS — R2689 Other abnormalities of gait and mobility: Secondary | ICD-10-CM | POA: Diagnosis not present

## 2020-02-15 DIAGNOSIS — R278 Other lack of coordination: Secondary | ICD-10-CM

## 2020-02-15 DIAGNOSIS — R2681 Unsteadiness on feet: Secondary | ICD-10-CM

## 2020-02-15 DIAGNOSIS — M6281 Muscle weakness (generalized): Secondary | ICD-10-CM | POA: Diagnosis not present

## 2020-02-15 NOTE — Therapy (Signed)
[facility] [address] [place], [place], [address] Phone: [phone]   Fax:  [phone]  Occupational Therapy Treatment  Patient Details  Name: Hannah Fischer MRN: [id number] Date of Birth: [birth year] No data recorded  Encounter Date: 02/15/2020   OT End of Session - 02/15/20 1120    Visit Number 4    Number of Visits 9    Date for OT Re-Evaluation 02/05/20    Authorization Type BC/BS MCR    Progress Note Due on Visit 10    OT Start Time 1105    OT Stop Time 1145    OT Time Calculation (min) 40 min    Activity Tolerance Patient tolerated treatment well    Behavior During Therapy [facility] for tasks assessed/performed           Past Medical History:  Diagnosis Date  . Abnormal nuclear stress test    2005, normal cath Dr. Elease Hashimoto, normal stress test 2012 Dr. Eldridge Dace  . Anemia    chronic  . Anxiety   . Arthritis    djd  . Arthritis of shoulder region, left, degenerative 12/03/2013  . Blood transfusion 1979   for anemia  . Chest pain    evaluated @ [facility] cardiology; had a stress test 2012  . Chronic pain syndrome 04/29/2019  . Chronic right-sided low back pain without sciatica 04/12/2019  . Complication of anesthesia    unable to breathe lying  flat on back  . Diabetes mellitus    type 2  niddm x 25 yrs  . Diabetes mellitus (HCC) 11/15/2011  . Diverticular disease   . DJD (degenerative joint disease)    lumbar and cervical  . GERD (gastroesophageal reflux disease)   . Hyperlipemia   . Hypertension    on medication  . Hypothyroidism    takes levoxyl  . Irritable bowel syndrome   . 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 Neuromuscular disorder (HCC)   . Neuropathy in diabetes (HCC)   . Nonspecific abnormal electrocardiogram (ECG) (EKG) 10/15/2013  . Other and unspecified angina pectoris    tests came back NOT heart related  . Pulmonary embolism (HCC) 06-2010   bilateral  . Pyuria  11/15/2011  . Seizures (HCC)    once, age 79  . TIA (transient ischemic attack) 11/15/2011    Past Surgical History:  Procedure Laterality Date  . BACK SURGERY    . BLEPHAROPLASTY  2012   rt eye  . CARDIAC CATHETERIZATION     had one 10/2013  . CHOLECYSTECTOMY    . EYE SURGERY  2007   cataract ext/ iol rt eye  . JOINT REPLACEMENT  2001,2005   both knees  . LEFT HEART CATHETERIZATION WITH CORONARY ANGIOGRAM N/A 10/18/2013   Procedure: LEFT HEART CATHETERIZATION WITH CORONARY ANGIOGRAM;  Surgeon: Corky Crafts, MD;  Location: [facility] CATH LAB;  Service: Cardiovascular;  Laterality: N/A;  . LUMBAR DISC SURGERY  1990  . REVERSE SHOULDER ARTHROPLASTY Left 12/03/2013   Procedure: LEFT SHOULDER REVERSE ARTHROPLASTY;  Surgeon: Verlee Rossetti, MD;  Location: [facility] OR;  Service: Orthopedics;  Laterality: Left;  . SHOULDER HEMI-ARTHROPLASTY  12/25/2010   Procedure: SHOULDER HEMI-ARTHROPLASTY;  Surgeon: Cammy Copa;  Location: [facility] OR;  Service: Orthopedics;  Laterality: Right;    There were no vitals filed for this visit.   Subjective Assessment - 02/15/20 1026    Subjective  My Lt hand stays sore    Patient is  accompanied by: Family member    Pertinent History CVA w/ Lt hemiplegia 11/23/19. PMH: previous CVA w/ no residual deficits, DM, PE, HTN, HLD, anxiety. P has walked w/ cane for about a year when outside    Limitations fall risk    Currently in Pain? No/denies           Pt placing beads on pegs Lt hand for coordination then removing 5 at a time for in hand manipulation and placing in color designated container. Pt carrying cup of water Lt hand while performing environmental scanning finding 10/13 items on first pass with no spills. Pt missed 2 upper Rt and 1 eye level on Lt. Pt only found 1/3 remaining items on 2nd pass without spills from cup.   Asked pt about any difficulties at home - she reports she is cooking and doing everything at home but has difficulty peeling potatoes  (pt uses regular knife) - recommended larger handled peeler or ergonomic knife.  Pt demo carrying pot 2/3 full of water to stove and back and pouring carefully without spills/difficulty.                           OT Long Term Goals - 02/03/20 1225      OT LONG TERM GOAL #1   Title Independent with coordination HEP for Lt hand    Time 4    Period Weeks    Status Achieved      OT LONG TERM GOAL #2   Title Pt to report less drops from Lt hand throughout day    Time 4    Period Weeks    Status On-going      OT LONG TERM GOAL #3   Title Pt to improve coordination as evidenced by performing 9 hole peg test in 45 sec or less    Baseline 57.63 sec    Time 4    Period Weeks    Status On-going      OT LONG TERM GOAL #4   Title Pt to verbalize understanding with memory strategies and cognitive tips prn    Time 4    Period Weeks    Status Achieved   ISSUED 02/03/20     OT LONG TERM GOAL #5   Title Pt to perform simple physical task w/ environmental scanning for dual tasking    Time 4    Period Weeks    Status On-going                 Plan - 02/15/20 1121    Clinical Impression Statement Pt progressing towards remaining goals. Pt reports not much change Lt hand but question her carryover of recommended HEPs. Pt is doing everything at PLOF per report but has difficulty peeling potatoes    OT Occupational Profile and History Problem Focused Assessment - Including review of records relating to presenting problem    Occupational performance deficits (Please refer to evaluation for details): IADL's;Leisure    Body Structure / Function / Physical Skills UE functional use;IADL;FMC;Coordination    Cognitive Skills Memory;Problem Solve;Safety Awareness    Rehab Potential Good    Clinical Decision Making Limited treatment options, no task modification necessary    Comorbidities Affecting Occupational Performance: May have comorbidities impacting occupational  performance    Modification or Assistance to Complete Evaluation  No modification of tasks or assist necessary to complete eval    OT Frequency 2x / week  OT Duration 4 weeks   plus eval (however anticipate only 2-3 weeks needed)   OT Treatment/Interventions Therapeutic activities;Therapeutic exercise;Cognitive remediation/compensation;Coping strategies training;Functional Mobility Training;Visual/perceptual remediation/compensation;Patient/family education;Neuromuscular education    Plan continue progress towards remaining LTG's - possible d/c next visit    Consulted and Agree with Plan of Care Patient;Family member/caregiver    Family Member Consulted husband           Patient will benefit from skilled therapeutic intervention in order to improve the following deficits and impairments:   Body Structure / Function / Physical Skills: UE functional use,IADL,FMC,Coordination Cognitive Skills: Memory,Problem Solve,Safety Awareness     Visit Diagnosis: Other lack of coordination  Other symptoms and signs involving cognitive functions following cerebral infarction    Problem List Patient Active Problem List   Diagnosis Date Noted  . Gait abnormality 01/19/2020  . Chest pressure 01/19/2020  . Cerebral amyloid angiopathy (CODE)   . Stroke (HCC) 11/24/2019  . AKI (acute kidney injury) (HCC) 11/24/2019  . Spinal stenosis of lumbar region with neurogenic claudication 10/26/2019  . Abnormality of gait 08/02/2019  . Cerebrovascular accident (CVA) (HCC) 07/22/2019  . Dizziness 06/15/2019  . Brain aneurysm 06/15/2019  . Chronic pain syndrome 04/29/2019  . Chronic right-sided low back pain without sciatica 04/12/2019  . Arthritis of shoulder region, left, degenerative 12/03/2013  . Nonspecific abnormal electrocardiogram (ECG) (EKG) 10/15/2013  . Other and unspecified angina pectoris 10/15/2013  . TIA (transient ischemic attack) 11/15/2011  . HTN (hypertension) 11/15/2011  . Diabetes  mellitus (HCC) 11/15/2011  . Hypothyroidism 11/15/2011  . Dyslipidemia 11/15/2011  . GERD (gastroesophageal reflux disease) 11/15/2011  . Pyuria 11/15/2011    Kelli Churn, OTR/L 02/15/2020, 11:22 AM  [facility] [address] [place], [place], [address] Phone: [phone]   Fax:  [phone]  Name: Hannah Fischer MRN: [id number] Date of Birth: 1932/12/18

## 2020-02-15 NOTE — Therapy (Signed)
[facility] [address] [place], [place], [address] Phone: [phone]   Fax:  [phone]  Physical Therapy Treatment  Patient Details  Name: Hannah Fischer MRN: [id number] Date of Birth: 05/19/1932 Referring Provider (PT): Azucena FallenLancaster, William C, MD (will be followed by Dr. Terrace ArabiaYan)   Encounter Date: 02/15/2020   PT End of Session - 02/15/20 1151    Visit Number 5    Number of Visits 13    Date for PT Re-Evaluation 04/04/20   written for 6 week POC   Authorization Type BCBS Medicare    PT Start Time 1103    PT Stop Time 1143    PT Time Calculation (min) 40 min    Equipment Utilized During Treatment Gait belt    Activity Tolerance Patient tolerated treatment well   seated rest breaks throughout   Behavior During Therapy WFL for tasks assessed/performed           Past Medical History:  Diagnosis Date  . Abnormal nuclear stress test    2005, normal cath Dr. Elease HashimotoNahser, normal stress test 2012 Dr. Eldridge DaceVaranasi  . Anemia    chronic  . Anxiety   . Arthritis    djd  . Arthritis of shoulder region, left, degenerative 12/03/2013  . Blood transfusion 1979   for anemia  . Chest pain    [facility]; had a stress test 2012  . Chronic pain syndrome 04/29/2019  . Chronic right-sided low back pain without sciatica 04/12/2019  . Complication of anesthesia    unable to breathe lying  flat on back  . Diabetes mellitus    type 2  niddm x 25 yrs  . Diabetes mellitus (HCC) 11/15/2011  . Diverticular disease   . DJD (degenerative joint disease)    lumbar and cervical  . GERD (gastroesophageal reflux disease)   . Hyperlipemia   . Hypertension    on medication  . Hypothyroidism    takes levoxyl  . Irritable bowel syndrome   . 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 Neuromuscular disorder (HCC)   . Neuropathy in diabetes (HCC)   . Nonspecific abnormal electrocardiogram (ECG) (EKG) 10/15/2013  . Other  and unspecified angina pectoris    tests came back NOT heart related  . Pulmonary embolism (HCC) 06-2010   bilateral  . Pyuria 11/15/2011  . Seizures (HCC)    once, age 89  . TIA (transient ischemic attack) 11/15/2011    Past Surgical History:  Procedure Laterality Date  . BACK SURGERY    . BLEPHAROPLASTY  2012   rt eye  . CARDIAC CATHETERIZATION     had one 10/2013  . CHOLECYSTECTOMY    . EYE SURGERY  2007   cataract ext/ iol rt eye  . JOINT REPLACEMENT  2001,2005   both knees  . LEFT HEART CATHETERIZATION WITH CORONARY ANGIOGRAM N/A 10/18/2013   Procedure: LEFT HEART CATHETERIZATION WITH CORONARY ANGIOGRAM;  Surgeon: Corky CraftsJayadeep S Varanasi, MD;  [facility];  Service: Cardiovascular;  Laterality: N/A;  . LUMBAR DISC SURGERY  1990  . REVERSE SHOULDER ARTHROPLASTY Left 12/03/2013   Procedure: LEFT SHOULDER REVERSE ARTHROPLASTY;  Surgeon: Verlee RossettiSteven R Norris, MD;  [facility];  Service: Orthopedics;  Laterality: Left;  . SHOULDER HEMI-ARTHROPLASTY  12/25/2010   Procedure: SHOULDER HEMI-ARTHROPLASTY;  Surgeon: Cammy CopaGregory Scott Dean;  [facility];  Service: Orthopedics;  Laterality: Right;    Vitals:   02/15/20 1139  BP: Marland Kitchen(!)  151/73  Pulse: (!) 45     Subjective Assessment - 02/15/20 1105    Subjective No falls, no changes. Reports no issues with her balance.    Patient is accompained by: Family member   husband   Pertinent History prior CVA without residual deficit, TIA, DM, PE, anemia, anxiety, hypertension, hyperlipidemia    Diagnostic tests MRI + small acute to subacute infarcts involving R frontal, L parietal and R occipital lobes.    Patient Stated Goals wants her L hand to work better    Currently in Pain? No/denies    Pain Onset 1 to 4 weeks ago                                  Balance Exercises - 02/15/20 0001      Balance Exercises: Standing   Standing Eyes Closed Wide (BOA);Narrow base of support (BOS);Head turns;Foam/compliant  surface    Standing Eyes Closed Limitations feet hip width eyes closed 2 x 30 seconds, narrow BOS eyes closed 2 x 30 seconds, with wide BOS head turns 2 x 10 reps, head nods 2 x 10 reps - intermittent tap to walls for balance    SLS with Vectors Solid surface    SLS with Vectors Limitations attempted alternating SLS taps to cones without UE support, pt able to perform x2 reps B before reporting feeling dizzy and needing to sit down    Stepping Strategy Anterior;Posterior;Limitations;10 reps;Foam/compliant surface    Stepping Strategy Limitations x10 reps each direction on blue balance beam    Rockerboard Anterior/posterior;EO;Limitations    Rockerboard Limitations weight shifting A/P with intermittent UE support x10 reps, multi directional reaching towards therapists hand x10 reps, then performing grabbing bean bag from therapist in multi directional and cross body reaching and tossing into crate x15 reps total, min guard/min A for balance    Marching Foam/compliant surface;Intermittent upper extremity assist;Limitations    Marching Limitations standing on blue air ex x10 reps, cues for slowed and controlled    Sit to Stand Without upper extremity support;Foam/compliant surface;Limitations    Sit to Stand Limitations 2 x 5 reps on blue foam balance beam, needing UE support during 5th rep               PT Short Term Goals - 02/03/20 1320      PT SHORT TERM GOAL #1   Title Pt will undergo further vestibular assessment with LTG to be written as appropriate. ALL STGS DUE 01/26/20    Baseline per patient reports dizziness seems to be more imbalance    Time 3    Period Weeks    Status Deferred    Target Date 01/26/20      PT SHORT TERM GOAL #2   Title Pt will undergo DGI for further dynamic balance testing with LTG written as appropriate.    Baseline 16/24 on 01/12/20    Time 3    Period Weeks    Status Achieved      PT SHORT TERM GOAL #3   Title Pt will improve gait speed with SPC to  at least 1.8 ft/sec in order to demo decr fall risk.    Baseline 1.55 ft/sec with SPC; 2.2 ft/sec    Time 3    Period Weeks    Status Achieved      PT SHORT TERM GOAL #4   Title Pt will decr 5x sit <> stand to  17 seconds or less in order to demo decr fall risk..    Baseline 19.97 seconds; 16.5 secs    Time 3    Period Weeks    Status Achieved             PT Long Term Goals - 02/15/20 1154      PT LONG TERM GOAL #1   Title Pt will be independent with final HEP in order to build upon functional gains made in therapy. ALL LTGS DUE 03/03/20    Time 6    Period Weeks    Status New      PT LONG TERM GOAL #2   Title Pt will improve DGI to at least a 19/24 in order to demo decr fall risk.    Baseline 16/24    Time 6    Period Weeks    Status Revised      PT LONG TERM GOAL #3   Title Pt will decr TUG time with SPC or with no AD to 13.5 seconds or less in order to demo decr fall risk.    Time 6    Period Weeks    Status New      PT LONG TERM GOAL #4   Title Vestibular goal to be written as appropriate after formal vestibular assessment.    Time 6    Period Weeks    Status New      PT LONG TERM GOAL #5   Title Pt will improve D/C FOTO score to a 58 in order to improve functional outcomes.    Baseline FOTO 49    Time 6    Period Weeks    Status New                 Plan - 02/15/20 1152    Clinical Impression Statement Today's skilled session focused on balance strategies on compliant surfaces - pt most challenged by eyes closed with head motions and stepping strategies on foam. When performing SLS taps to cones at end of session pt reporting feeling dizzy after 2 reps and needing to sit down. Assessed pt's BP (see vitals) - WFL for therapy, pt's HR was bradycardic (which pt reports is normal for her). Pt feeling better with less dizziness at end of session before ambulating out. Will continue to progress towards LTGs. Revised LTG date due to pt missing a couple weeks  of appts.    Personal Factors and Comorbidities Comorbidity 3+    Comorbidities prior CVA without residual deficit, TIA, DM, PE, anemia, anxiety, hypertension, hyperlipidemia, B knee replacements, B shoulder replacements    Examination-Activity Limitations Stairs;Locomotion Level    Examination-Participation Restrictions Community Activity    Stability/Clinical Decision Making Evolving/Moderate complexity    Rehab Potential Good    PT Frequency 2x / week    PT Duration 6 weeks    PT Treatment/Interventions ADLs/Self Care Home Management;Canalith Repostioning;DME Instruction;Gait training;Stair training;Functional mobility training;Therapeutic activities;Therapeutic exercise;Neuromuscular re-education;Balance training;Patient/family education;Vestibular    PT Next Visit Plan dizziness seems to be imbalance related. Continue balance with eyes closed, SLS. functional BLE strengthening.    Consulted and Agree with Plan of Care Patient;Family member/caregiver    Family Member Consulted pt's husband           Patient will benefit from skilled therapeutic intervention in order to improve the following deficits and impairments:  Abnormal gait,Decreased balance,Decreased strength,Dizziness,Decreased activity tolerance,Difficulty walking  Visit Diagnosis: Other lack of coordination  Other symptoms and signs involving cognitive functions  following cerebral infarction  Other abnormalities of gait and mobility  Unsteadiness on feet  Muscle weakness (generalized)     Problem List Patient Active Problem List   Diagnosis Date Noted  . Gait abnormality 01/19/2020  . Chest pressure 01/19/2020  . Cerebral amyloid angiopathy (CODE)   . Stroke (HCC) 11/24/2019  . AKI (acute kidney injury) (HCC) 11/24/2019  . Spinal stenosis of lumbar region with neurogenic claudication 10/26/2019  . Abnormality of gait 08/02/2019  . Cerebrovascular accident (CVA) (HCC) 07/22/2019  . Dizziness 06/15/2019  .  Brain aneurysm 06/15/2019  . Chronic pain syndrome 04/29/2019  . Chronic right-sided low back pain without sciatica 04/12/2019  . Arthritis of shoulder region, left, degenerative 12/03/2013  . Nonspecific abnormal electrocardiogram (ECG) (EKG) 10/15/2013  . Other and unspecified angina pectoris 10/15/2013  . TIA (transient ischemic attack) 11/15/2011  . HTN (hypertension) 11/15/2011  . Diabetes mellitus (HCC) 11/15/2011  . Hypothyroidism 11/15/2011  . Dyslipidemia 11/15/2011  . GERD (gastroesophageal reflux disease) 11/15/2011  . Pyuria 11/15/2011    Drake Leach, PT, DPT  02/15/2020, 11:55 AM  [facility] [address] [place], [place], [address] Phone: [phone]   Fax:  [phone]  Name: Hannah Fischer MRN: [id number] Date of Birth: [birth year]

## 2020-02-16 DIAGNOSIS — Z1389 Encounter for screening for other disorder: Secondary | ICD-10-CM | POA: Diagnosis not present

## 2020-02-16 DIAGNOSIS — Z0000 Encounter for general adult medical examination without abnormal findings: Secondary | ICD-10-CM | POA: Diagnosis not present

## 2020-02-17 ENCOUNTER — Other Ambulatory Visit: Payer: Self-pay

## 2020-02-17 ENCOUNTER — Encounter: Payer: Self-pay | Admitting: Occupational Therapy

## 2020-02-17 ENCOUNTER — Ambulatory Visit: Payer: Medicare Other

## 2020-02-17 ENCOUNTER — Ambulatory Visit: Payer: Medicare Other | Admitting: Occupational Therapy

## 2020-02-17 DIAGNOSIS — R269 Unspecified abnormalities of gait and mobility: Secondary | ICD-10-CM

## 2020-02-17 DIAGNOSIS — R278 Other lack of coordination: Secondary | ICD-10-CM | POA: Diagnosis not present

## 2020-02-17 DIAGNOSIS — R41841 Cognitive communication deficit: Secondary | ICD-10-CM | POA: Diagnosis not present

## 2020-02-17 DIAGNOSIS — M6281 Muscle weakness (generalized): Secondary | ICD-10-CM | POA: Diagnosis not present

## 2020-02-17 DIAGNOSIS — I69318 Other symptoms and signs involving cognitive functions following cerebral infarction: Secondary | ICD-10-CM

## 2020-02-17 DIAGNOSIS — R2681 Unsteadiness on feet: Secondary | ICD-10-CM

## 2020-02-17 DIAGNOSIS — R2689 Other abnormalities of gait and mobility: Secondary | ICD-10-CM | POA: Diagnosis not present

## 2020-02-17 NOTE — Therapy (Signed)
[facility] [address] [place], [place], [address] Phone: [phone]   Fax:  [phone]  Occupational Therapy Treatment  Patient Details  Name: Hannah Fischer MRN: [id number] Date of Birth: Oct 06, 1932 No data recorded  Encounter Date: 02/17/2020   OT End of Session - 02/17/20 1543    Visit Number 5    Number of Visits 9    Authorization Type BC/BS MCR    Progress Note Due on Visit 10    OT Start Time 1525    OT Stop Time 1540    OT Time Calculation (min) 15 min    Activity Tolerance Patient tolerated treatment well    Behavior During Therapy [facility] for tasks assessed/performed           Past Medical History:  Diagnosis Date  . Abnormal nuclear stress test    2005, normal cath Dr. Acie Fredrickson, normal stress test 2012 Dr. Irish Lack  . Anemia    chronic  . Anxiety   . Arthritis    djd  . Arthritis of shoulder region, left, degenerative 12/03/2013  . Blood transfusion 1979   for anemia  . Chest pain    evaluated @ [facility] cardiology; had a stress test 2012  . Chronic pain syndrome 04/29/2019  . Chronic right-sided low back pain without sciatica 04/12/2019  . Complication of anesthesia    unable to breathe lying  flat on back  . Diabetes mellitus    type 2  niddm x 25 yrs  . Diabetes mellitus ([place]) 11/15/2011  . Diverticular disease   . DJD (degenerative joint disease)    lumbar and cervical  . GERD (gastroesophageal reflux disease)   . Hyperlipemia   . Hypertension    on medication  . Hypothyroidism    takes levoxyl  . Irritable bowel syndrome   . 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 Neuromuscular disorder ([place])   . Neuropathy in diabetes ([place])   . Nonspecific abnormal electrocardiogram (ECG) (EKG) 10/15/2013  . Other and unspecified angina pectoris    tests came back NOT heart related  . Pulmonary embolism ([place]) 06-2010   bilateral  . Pyuria 11/15/2011  . Seizures ([place])    once, age 87   . TIA (transient ischemic attack) 11/15/2011    Past Surgical History:  Procedure Laterality Date  . BACK SURGERY    . BLEPHAROPLASTY  2012   rt eye  . CARDIAC CATHETERIZATION     had one 10/2013  . CHOLECYSTECTOMY    . EYE SURGERY  2007   cataract ext/ iol rt eye  . JOINT REPLACEMENT  2001,2005   both knees  . LEFT HEART CATHETERIZATION WITH CORONARY ANGIOGRAM N/A 10/18/2013   Procedure: LEFT HEART CATHETERIZATION WITH CORONARY ANGIOGRAM;  Surgeon: Jettie Booze, MD;  Location[facility] C[facility] LAB;  Service: Cardiovascular;  Laterality: N/A;  . LUMBAR [place]  . REVERSE SHOULDER ARTHROPLASTY Left 12/03/2013   Procedure: LEFT SHOULDER REVERSE ARTHROPLASTY;  Surgeon: Augustin Schooling, MD;  Location: [facility];  Service: Orthopedics;  Laterality: Left;  . SHOULDER HEMI-ARTHROPLASTY  12/25/2010   Procedure: SHOULDER HEMI-ARTHROPLASTY;  Surgeon: Meredith Pel;  Location: [place] [facility];  Service: Orthopedics;  Laterality: Right;    There were no vitals filed for this visit.   Subjective Assessment - 02/17/20 1524    Subjective  It hurts when I move it - just the thumb    Patient is accompanied by: Family  member    Pertinent History CVA w/ Lt hemiplegia 11/23/19. PMH: previous CVA w/ no residual deficits, DM, PE, HTN, HLD, anxiety. P has walked w/ cane for about a year when outside    Limitations fall risk    Currently in Pain? No/denies    Pain Score 0-No pain                        OT Treatments/Exercises (OP) - 02/17/20 1539      ADLs   ADL Comments Reviewed remaining OT goals.  Patient did well with environmental scanning while walking without cane in busy clinic environment.  Patient moves very quickly - historically, and would be better served to slow down, but this is not her preferred pattern.  Goal check and patient has met all of her OT goals and is agreeable to OT discharge.  Patient and husband excited to be finished with therapy.                   OT Education - 02/17/20 1543    Education Details goal achievement    Person(s) Educated Patient;Spouse    Methods Explanation    Comprehension Verbalized understanding               OT Long Term Goals - 02/17/20 1527      OT LONG TERM GOAL #1   Title Independent with coordination HEP for Lt hand    Time 4    Period Weeks    Status Achieved      OT LONG TERM GOAL #2   Title Pt to report less drops from Lt hand throughout day    Time 4    Period Weeks    Status Achieved      OT LONG TERM GOAL #3   Title Pt to improve coordination as evidenced by performing 9 hole peg test in 45 sec or less    Baseline 57.63 sec initially, then 43.16 02/17/20    Time 4    Period Weeks    Status Achieved      OT LONG TERM GOAL #4   Title Pt to verbalize understanding with memory strategies and cognitive tips prn    Time 4    Period Weeks    Status Achieved      OT LONG TERM GOAL #5   Title Pt to perform simple physical task w/ environmental scanning for dual tasking    Time 4    Period Weeks    Status Achieved                 Plan - 02/17/20 1544    Clinical Impression Statement Patient has met OT goals and is excited for discharge    OT Occupational Profile and History Problem Focused Assessment - Including review of records relating to presenting problem    Occupational performance deficits (Please refer to evaluation for details): IADL's;Leisure    Body Structure / Function / Physical Skills UE functional use;IADL;FMC;Coordination    Cognitive Skills Memory;Problem Solve;Safety Awareness    Rehab Potential Good    Clinical Decision Making Limited treatment options, no task modification necessary    Comorbidities Affecting Occupational Performance: May have comorbidities impacting occupational performance    Modification or Assistance to Complete Evaluation  No modification of tasks or assist necessary to complete eval    OT Frequency 2x / week    OT  Duration 4 weeks    OT Treatment/Interventions  Therapeutic activities;Therapeutic exercise;Cognitive remediation/compensation;Coping strategies training;Functional Mobility Training;Visual/perceptual remediation/compensation;Patient/family education;Neuromuscular education    Plan discharge    Consulted and Agree with Plan of Care Patient;Family member/caregiver    Family Member Consulted husband           Patient will benefit from skilled therapeutic intervention in order to improve the following deficits and impairments:   Body Structure / Function / Physical Skills: UE functional use,IADL,FMC,Coordination Cognitive Skills: Memory,Problem Solve,Safety Awareness     Visit Diagnosis: Other lack of coordination  Other symptoms and signs involving cognitive functions following cerebral infarction  Unsteadiness on feet  Muscle weakness (generalized)    Problem List Patient Active Problem List   Diagnosis Date Noted  . Gait abnormality 01/19/2020  . Chest pressure 01/19/2020  . Cerebral amyloid angiopathy (CODE)   . Stroke ([place]) 11/24/2019  . AKI (acute kidney injury) ([place]) 11/24/2019  . Spinal stenosis of lumbar region with neurogenic claudication 10/26/2019  . Abnormality of gait 08/02/2019  . Cerebrovascular accident (CVA) ([place]) 07/22/2019  . Dizziness 06/15/2019  . Brain aneurysm 06/15/2019  . Chronic pain syndrome 04/29/2019  . Chronic right-sided low back pain without sciatica 04/12/2019  . Arthritis of shoulder region, left, degenerative 12/03/2013  . Nonspecific abnormal electrocardiogram (ECG) (EKG) 10/15/2013  . Other and unspecified angina pectoris 10/15/2013  . TIA (transient ischemic attack) 11/15/2011  . HTN (hypertension) 11/15/2011  . Diabetes mellitus ([place]) 11/15/2011  . Hypothyroidism 11/15/2011  . Dyslipidemia 11/15/2011  . GERD (gastroesophageal reflux disease) 11/15/2011  . Pyuria 11/15/2011  OCCUPATIONAL THERAPY DISCHARGE SUMMARY  Visits from  Start of Care: 5  Current functional level related to goals / functional outcomes: Improved coordination and functional use of LUE   Remaining deficits: Moves rapidly   Education / Equipment: HEP Plan: Patient agrees to discharge.  Patient goals were met. Patient is being discharged due to meeting the stated rehab goals.  ?????      Mariah Milling, OTR/L 02/17/2020, 3:45 PM  [facility] [address] [place], [place], [address] Phone: [phone]   Fax:  [phone]  Name: SELETA HOVLAND MRN: [id number] Date of Birth: January 20, 1933

## 2020-02-17 NOTE — Therapy (Signed)
[facility] [address] [place], [place], [address] Phone: [phone]   Fax:  [phone]  Physical Therapy Treatment  Patient Details  Name: Hannah Fischer MRN: [id number] Date of Birth: [birth year] Referring Provider (PT): Azucena Fallen, MD (will be followed by Dr. Terrace Arabia)   Encounter Date: 02/17/2020   PT End of Session - 02/17/20 1433    Visit Number 6    Number of Visits 13    Date for PT Re-Evaluation 04/04/20   written for 6 week POC   Authorization Type BCBS Medicare    PT Start Time 1430    PT Stop Time 1520    PT Time Calculation (min) 50 min    Equipment Utilized During Treatment Gait belt    Activity Tolerance Patient tolerated treatment well;Patient limited by fatigue   seated rest breaks throughout   Behavior During Therapy WFL for tasks assessed/performed           Past Medical History:  Diagnosis Date  . Abnormal nuclear stress test    2005, normal cath Dr. Elease Hashimoto, normal stress test 2012 Dr. Eldridge Dace  . Anemia    chronic  . Anxiety   . Arthritis    djd  . Arthritis of shoulder region, left, degenerative 12/03/2013  . Blood transfusion 1979   for anemia  . Chest pain    evaluated @ [facility] cardiology; had a stress test 2012  . Chronic pain syndrome 04/29/2019  . Chronic right-sided low back pain without sciatica 04/12/2019  . Complication of anesthesia    unable to breathe lying  flat on back  . Diabetes mellitus    type 2  niddm x 25 yrs  . Diabetes mellitus (HCC) 11/15/2011  . Diverticular disease   . DJD (degenerative joint disease)    lumbar and cervical  . GERD (gastroesophageal reflux disease)   . Hyperlipemia   . Hypertension    on medication  . Hypothyroidism    takes levoxyl  . Irritable bowel syndrome   . 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 Neuromuscular disorder (HCC)   . Neuropathy in diabetes (HCC)   . Nonspecific abnormal electrocardiogram (ECG)  (EKG) 10/15/2013  . Other and unspecified angina pectoris    tests came back NOT heart related  . Pulmonary embolism (HCC) 06-2010   bilateral  . Pyuria 11/15/2011  . Seizures (HCC)    once, age 27  . TIA (transient ischemic attack) 11/15/2011    Past Surgical History:  Procedure Laterality Date  . BACK SURGERY    . BLEPHAROPLASTY  2012   rt eye  . CARDIAC CATHETERIZATION     had one 10/2013  . CHOLECYSTECTOMY    . EYE SURGERY  2007   cataract ext/ iol rt eye  . JOINT REPLACEMENT  2001,2005   both knees  . LEFT HEART CATHETERIZATION WITH CORONARY ANGIOGRAM N/A 10/18/2013   Procedure: LEFT HEART CATHETERIZATION WITH CORONARY ANGIOGRAM;  Surgeon: Corky Crafts, MD;  Location: [facility] CATH LAB;  Service: Cardiovascular;  Laterality: N/A;  . LUMBAR DISC SURGERY  1990  . REVERSE SHOULDER ARTHROPLASTY Left 12/03/2013   Procedure: LEFT SHOULDER REVERSE ARTHROPLASTY;  Surgeon: Verlee Rossetti, MD;  Location: [facility] OR;  Service: Orthopedics;  Laterality: Left;  . SHOULDER HEMI-ARTHROPLASTY  12/25/2010   Procedure: SHOULDER HEMI-ARTHROPLASTY;  Surgeon: Cammy Copa;  Location: [facility] OR;  Service: Orthopedics;  Laterality: Right;    There were no vitals filed  for this visit.   Subjective Assessment - 02/17/20 1519    Subjective No falls, no changes. Reports no issues with her balance.  Has benn walking inhome w/o using cane    Patient is accompained by: Family member    Pertinent History prior CVA without residual deficit, TIA, DM, PE, anemia, anxiety, hypertension, hyperlipidemia    Diagnostic tests MRI + small acute to subacute infarcts involving R frontal, L parietal and R occipital lobes.    Patient Stated Goals wants her L hand to work better    Currently in Pain? No/denies    Multiple Pain Sites No                             [facility] Adult PT Treatment/Exercise - 02/17/20 0001      Transfers   Transfers Sit to Stand;Stand to Sit    Sit to Stand 5:  Supervision;Without upper extremity assist;From chair/3-in-1    Sit to Stand Details Verbal cues for technique    Sit to Stand Details (indicate cue type and reason) able to coplete with arms crossed      Ambulation/Gait   Ambulation/Gait Yes    Ambulation/Gait Assistance 4: Min guard    Ambulation/Gait Assistance Details 1315ft x2 w/o cane using CGA/MinA of therapist, able to stay within gray tiles on track wo VCs    Assistive device None    Gait Pattern Step-through pattern;Wide base of support    Ambulation Surface Level      Knee/Hip Exercises: Standing   Forward Step Up Both;1 set;Step Height: 4";Limitations;10 reps               Balance Exercises - 02/17/20 0001      Balance Exercises: Standing   Standing Eyes Opened Head turns;Foam/compliant surface;Narrow base of support (BOS)    Standing Eyes Opened Limitations x2 reps and x10 reps head turns, x2 reps and x10 reps head nods    Standing Eyes Closed Wide (BOA);Narrow base of support (BOS);Head turns;Foam/compliant surface    Standing Eyes Closed Limitations feet hip width eyes closed 2 x 30 seconds, narrow BOS eyes closed 2 x 30 seconds, with wide BOS head turns 2 x 10 reps, head nods 2 x 10 reps - intermittent tap to walls for balance    Rockerboard Anterior/posterior;EO;Limitations    Rockerboard Limitations ball toss for 2' with 2nd PT needed to S for safety    Marching Foam/compliant surface;Intermittent upper extremity assist;Limitations    Marching Limitations 10 reps alternating on airex with HHA of PT w/o LOB observed    Sit to Stand Without upper extremity support;Foam/compliant surface;Limitations    Sit to Stand Limitations 2 x 5 reps on airex with HHA of PT to determine stability and need of UE support               PT Short Term Goals - 02/03/20 1320      PT SHORT TERM GOAL #1   Title Pt will undergo further vestibular assessment with LTG to be written as appropriate. ALL STGS DUE 01/26/20    Baseline  per patient reports dizziness seems to be more imbalance    Time 3    Period Weeks    Status Deferred    Target Date 01/26/20      PT SHORT TERM GOAL #2   Title Pt will undergo DGI for further dynamic balance testing with LTG written as appropriate.    Baseline  16/24 on 01/12/20    Time 3    Period Weeks    Status Achieved      PT SHORT TERM GOAL #3   Title Pt will improve gait speed with SPC to at least 1.8 ft/sec in order to demo decr fall risk.    Baseline 1.55 ft/sec with SPC; 2.2 ft/sec    Time 3    Period Weeks    Status Achieved      PT SHORT TERM GOAL #4   Title Pt will decr 5x sit <> stand to 17 seconds or less in order to demo decr fall risk..    Baseline 19.97 seconds; 16.5 secs    Time 3    Period Weeks    Status Achieved             PT Long Term Goals - 02/15/20 1154      PT LONG TERM GOAL #1   Title Pt will be independent with final HEP in order to build upon functional gains made in therapy. ALL LTGS DUE 03/03/20    Time 6    Period Weeks    Status New      PT LONG TERM GOAL #2   Title Pt will improve DGI to at least a 19/24 in order to demo decr fall risk.    Baseline 16/24    Time 6    Period Weeks    Status Revised      PT LONG TERM GOAL #3   Title Pt will decr TUG time with SPC or with no AD to 13.5 seconds or less in order to demo decr fall risk.    Time 6    Period Weeks    Status New      PT LONG TERM GOAL #4   Title Vestibular goal to be written as appropriate after formal vestibular assessment.    Time 6    Period Weeks    Status New      PT LONG TERM GOAL #5   Title Pt will improve D/C FOTO score to a 58 in order to improve functional outcomes.    Baseline FOTO 49    Time 6    Period Weeks    Status New                 Plan - 02/17/20 1533    Clinical Impression Statement Patient reports she is doing well at home and denies any LOB, falls or feelings of dizziness, she reports she does not use her cane in home and feels  she has regained baseline w/o deficits.  She demos appropriate safety awareness and performs balance activities using UEs as needed for support.  She requires encouragement to remain engaged in therapy and may be close to goal achievement    Personal Factors and Comorbidities Comorbidity 3+    Comorbidities prior CVA without residual deficit, TIA, DM, PE, anemia, anxiety, hypertension, hyperlipidemia, B knee replacements, B shoulder replacements    Examination-Activity Limitations Stairs;Locomotion Level    Examination-Participation Restrictions Community Activity    Stability/Clinical Decision Making Evolving/Moderate complexity    Rehab Potential Good    PT Frequency 2x / week    PT Duration 6 weeks    PT Treatment/Interventions ADLs/Self Care Home Management;Canalith Repostioning;DME Instruction;Gait training;Stair training;Functional mobility training;Therapeutic activities;Therapeutic exercise;Neuromuscular re-education;Balance training;Patient/family education;Vestibular    PT Next Visit Plan consider DC from PT based on patients level of progress in clinic    Consulted and Agree with  Plan of Care Patient;Family member/caregiver    Family Member Consulted pt's husband           Patient will benefit from skilled therapeutic intervention in order to improve the following deficits and impairments:  Abnormal gait,Decreased balance,Decreased strength,Dizziness,Decreased activity tolerance,Difficulty walking  Visit Diagnosis: Abnormality of gait     Problem List Patient Active Problem List   Diagnosis Date Noted  . Gait abnormality 01/19/2020  . Chest pressure 01/19/2020  . Cerebral amyloid angiopathy (CODE)   . Stroke (HCC) 11/24/2019  . AKI (acute kidney injury) (HCC) 11/24/2019  . Spinal stenosis of lumbar region with neurogenic claudication 10/26/2019  . Abnormality of gait 08/02/2019  . Cerebrovascular accident (CVA) (HCC) 07/22/2019  . Dizziness 06/15/2019  . Brain  aneurysm 06/15/2019  . Chronic pain syndrome 04/29/2019  . Chronic right-sided low back pain without sciatica 04/12/2019  . Arthritis of shoulder region, left, degenerative 12/03/2013  . Nonspecific abnormal electrocardiogram (ECG) (EKG) 10/15/2013  . Other and unspecified angina pectoris 10/15/2013  . TIA (transient ischemic attack) 11/15/2011  . HTN (hypertension) 11/15/2011  . Diabetes mellitus (HCC) 11/15/2011  . Hypothyroidism 11/15/2011  . Dyslipidemia 11/15/2011  . GERD (gastroesophageal reflux disease) 11/15/2011  . Pyuria 11/15/2011    Hildred Laser PT 02/17/2020, 4:34 PM  [facility] [address] [place], [place], [address] Phone: [phone]   Fax:  [phone]  Name: Hannah Fischer MRN: [id number] Date of Birth: 10/13/1932

## 2020-02-18 ENCOUNTER — Other Ambulatory Visit: Payer: Self-pay | Admitting: Family Medicine

## 2020-02-18 DIAGNOSIS — Z1231 Encounter for screening mammogram for malignant neoplasm of breast: Secondary | ICD-10-CM

## 2020-02-22 ENCOUNTER — Ambulatory Visit: Payer: Medicare Other | Admitting: Physical Therapy

## 2020-02-22 ENCOUNTER — Ambulatory Visit: Payer: Medicare Other

## 2020-02-22 ENCOUNTER — Encounter: Payer: Self-pay | Admitting: Physical Therapy

## 2020-02-22 ENCOUNTER — Ambulatory Visit: Payer: Medicare Other | Admitting: Occupational Therapy

## 2020-02-22 NOTE — Therapy (Signed)
[facility] [address] [place], [place], [address] Phone: [phone]   Fax:  [phone]  Patient Details  Name: Hannah Fischer MRN: [id number] Date of Birth: 08/21/1932 Referring Provider:  No ref. provider found  Encounter Date: 02/22/2020     PHYSICAL THERAPY DISCHARGE SUMMARY    Visits from Start of Care: 6  Pt called to cancel all future visits. Pt feels that she is back to her baseline.   Current functional level related to goals / functional outcomes: See most recent tx note - pt has met all STGs. Unable to assess LTGs.    Remaining deficits: Impaired high level balance, gait abnormalities    Education / Equipment: HEP   Plan: Patient agrees to discharge.  Patient goals were partially met. Patient is being discharged due to the patient's request.  ?????       Arliss Journey, PT, DPT  02/22/2020, 12:49 PM  [facility] [address] [place], [place], [address] Phone: [phone]   Fax:  [phone]

## 2020-02-23 NOTE — Therapy (Signed)
[facility] [address] [place], [place], [address] Phone: [phone]   Fax:  [phone]  Patient Details  Name: Hannah Fischer MRN: [id number] Date of Birth: 1932-05-28 Referring Provider:  [name], MD  Encounter Date: 02/23/2020   SPEECH THERAPY DISCHARGE SUMMARY  Visits from Start of Care: 2  Current functional level related to goals / functional outcomes: Pt only came to one therapy session after initial eval. She and husband both stated pt's skills were at baseline however pt  Appeared to this SLP to have some cognitive communication deficits. After further testing on 02-08-20, the patient stated that tasks she did on 02-08-19 would have been easier for her prior to October 2021 (date of CVA). Pt then called to cancel remaining appointments. Goals/plan were as follows:    SLP Short Term Goals - 02/08/20 1401                SLP SHORT TERM GOAL #1     Title pt will demonstrate compensations for decr'd comprehension of written or auditory information x 2 sessions (reqeuest repeats, write notes,etc)      Time 4      Period Weeks   or 9 total sessions, for all STGs     Status On-going            SLP SHORT TERM GOAL #2     Title pt will demonstrate understanding of temporal orientation (in order to track appointments) with modified independence x3 sessions      Time 4      Period Weeks      Status On-going            SLP SHORT TERM GOAL #3     Title pt will organize functional items such as grocery list, errands, to-do list (etc) with extra time x2 sessions      Time 4      Period Weeks      Status On-going            SLP SHORT TERM GOAL #4     Title pt will complete cogntive linguistic testing      Time 2      Period Weeks      Status On-going            SLP SHORT TERM GOAL #5     Title pt will demonstrate selective attention for 7 minutes in min noisy environment to complete a simple-mod complex  linguistic task      Time 4      Period Weeks      Status On-going                       SLP Long Term Goals - 02/08/20 1401                SLP LONG TERM GOAL #1     Title pt will demonstrate WFL alternating attention for 7 minutes to complete a simple-mod complex linguistic task      Time 8      Period Weeks   or 17 sessions, for all LTGs     Status On-going            SLP LONG TERM GOAL #2     Title pt will demonstrate [facility] organization skills in min-mod complex linguistic task in 3 sessions      Time 8      Period Weeks  Status On-going            SLP LONG TERM GOAL #3     Title pt will demo emergent awareness in therapy tasks 100% with min nonverbal cues in 3 sessions      Time 8      Period Weeks      Status On-going                       Plan - 02/08/20 1359          Clinical Impression Statement [name] presents today with likely min-mod cognitive communication deficits. However, again the pt denied deficits Today she again demonstrated decr'd attention, awareness, organization, and memory using the SAGE. Pt admitted task/s on this assessment would have been easier prior to her OCtober 2021 hospitalization and SLP told pt she would work with these things in [place] believes pt, if she is willing, would benefit from skilled ST to target cognitive communication deficits.           Remaining deficits: Cognitive-communication deficits.    Education: Deficit areas and possible goals.   Plan: Patient agrees to discharge.  Patient goals were not met. Patient is being discharged due to pt request.         [facility] ,[place], [place]  02/23/2020, 8:39 AM  [facility] [address] [place], [place], [address] Phone: [phone]   Fax:  [phone]

## 2020-02-24 ENCOUNTER — Ambulatory Visit: Payer: Medicare Other | Admitting: Occupational Therapy

## 2020-02-24 ENCOUNTER — Ambulatory Visit: Payer: Medicare Other

## 2020-02-29 ENCOUNTER — Ambulatory Visit: Payer: Medicare Other | Admitting: Physical Therapy

## 2020-02-29 ENCOUNTER — Ambulatory Visit: Payer: Medicare Other

## 2020-03-02 ENCOUNTER — Ambulatory Visit: Payer: Medicare Other

## 2020-03-02 ENCOUNTER — Ambulatory Visit: Payer: Medicare Other | Admitting: Physical Therapy

## 2020-03-06 DIAGNOSIS — H2513 Age-related nuclear cataract, bilateral: Secondary | ICD-10-CM | POA: Diagnosis not present

## 2020-03-06 DIAGNOSIS — H5212 Myopia, left eye: Secondary | ICD-10-CM | POA: Diagnosis not present

## 2020-03-06 DIAGNOSIS — Z961 Presence of intraocular lens: Secondary | ICD-10-CM | POA: Diagnosis not present

## 2020-03-06 DIAGNOSIS — Z9849 Cataract extraction status, unspecified eye: Secondary | ICD-10-CM | POA: Diagnosis not present

## 2020-03-07 ENCOUNTER — Ambulatory Visit: Payer: Medicare Other

## 2020-03-14 DIAGNOSIS — H18521 Epithelial (juvenile) corneal dystrophy, right eye: Secondary | ICD-10-CM | POA: Diagnosis not present

## 2020-03-18 ENCOUNTER — Emergency Department (HOSPITAL_COMMUNITY): Payer: Medicare Other

## 2020-03-18 ENCOUNTER — Encounter (HOSPITAL_COMMUNITY): Payer: Self-pay | Admitting: Emergency Medicine

## 2020-03-18 ENCOUNTER — Other Ambulatory Visit: Payer: Self-pay

## 2020-03-18 ENCOUNTER — Inpatient Hospital Stay (HOSPITAL_COMMUNITY)
Admission: EM | Admit: 2020-03-18 | Discharge: 2020-05-01 | DRG: 064 | Disposition: A | Discharge status: Skilled Nursing Facility | Payer: Medicare Other | Attending: Internal Medicine | Admitting: Internal Medicine

## 2020-03-18 DIAGNOSIS — G9340 Encephalopathy, unspecified: Secondary | ICD-10-CM | POA: Diagnosis present

## 2020-03-18 DIAGNOSIS — Z882 Allergy status to sulfonamides status: Secondary | ICD-10-CM

## 2020-03-18 DIAGNOSIS — I6602 Occlusion and stenosis of left middle cerebral artery: Secondary | ICD-10-CM | POA: Diagnosis not present

## 2020-03-18 DIAGNOSIS — Z7189 Other specified counseling: Secondary | ICD-10-CM | POA: Diagnosis not present

## 2020-03-18 DIAGNOSIS — R6889 Other general symptoms and signs: Secondary | ICD-10-CM | POA: Diagnosis not present

## 2020-03-18 DIAGNOSIS — G8194 Hemiplegia, unspecified affecting left nondominant side: Secondary | ICD-10-CM | POA: Diagnosis not present

## 2020-03-18 DIAGNOSIS — R252 Cramp and spasm: Secondary | ICD-10-CM | POA: Diagnosis present

## 2020-03-18 DIAGNOSIS — Z431 Encounter for attention to gastrostomy: Secondary | ICD-10-CM | POA: Diagnosis not present

## 2020-03-18 DIAGNOSIS — I6523 Occlusion and stenosis of bilateral carotid arteries: Secondary | ICD-10-CM | POA: Diagnosis not present

## 2020-03-18 DIAGNOSIS — Z936 Other artificial openings of urinary tract status: Secondary | ICD-10-CM | POA: Diagnosis not present

## 2020-03-18 DIAGNOSIS — R4701 Aphasia: Secondary | ICD-10-CM | POA: Diagnosis present

## 2020-03-18 DIAGNOSIS — N183 Chronic kidney disease, stage 3 unspecified: Secondary | ICD-10-CM | POA: Diagnosis not present

## 2020-03-18 DIAGNOSIS — E854 Organ-limited amyloidosis: Secondary | ICD-10-CM | POA: Diagnosis not present

## 2020-03-18 DIAGNOSIS — Z8616 Personal history of COVID-19: Secondary | ICD-10-CM | POA: Diagnosis not present

## 2020-03-18 DIAGNOSIS — I700 Atherosclerosis of aorta: Secondary | ICD-10-CM | POA: Diagnosis not present

## 2020-03-18 DIAGNOSIS — Z4659 Encounter for fitting and adjustment of other gastrointestinal appliance and device: Secondary | ICD-10-CM

## 2020-03-18 DIAGNOSIS — Z743 Need for continuous supervision: Secondary | ICD-10-CM | POA: Diagnosis not present

## 2020-03-18 DIAGNOSIS — E1149 Type 2 diabetes mellitus with other diabetic neurological complication: Secondary | ICD-10-CM | POA: Diagnosis not present

## 2020-03-18 DIAGNOSIS — I6389 Other cerebral infarction: Principal | ICD-10-CM | POA: Diagnosis present

## 2020-03-18 DIAGNOSIS — G894 Chronic pain syndrome: Secondary | ICD-10-CM | POA: Diagnosis present

## 2020-03-18 DIAGNOSIS — J9811 Atelectasis: Secondary | ICD-10-CM | POA: Diagnosis not present

## 2020-03-18 DIAGNOSIS — M4186 Other forms of scoliosis, lumbar region: Secondary | ICD-10-CM | POA: Diagnosis not present

## 2020-03-18 DIAGNOSIS — N19 Unspecified kidney failure: Secondary | ICD-10-CM

## 2020-03-18 DIAGNOSIS — Z0189 Encounter for other specified special examinations: Secondary | ICD-10-CM

## 2020-03-18 DIAGNOSIS — R7401 Elevation of levels of liver transaminase levels: Secondary | ICD-10-CM | POA: Diagnosis not present

## 2020-03-18 DIAGNOSIS — M7989 Other specified soft tissue disorders: Secondary | ICD-10-CM

## 2020-03-18 DIAGNOSIS — R404 Transient alteration of awareness: Secondary | ICD-10-CM | POA: Diagnosis not present

## 2020-03-18 DIAGNOSIS — D72819 Decreased white blood cell count, unspecified: Secondary | ICD-10-CM | POA: Diagnosis present

## 2020-03-18 DIAGNOSIS — R338 Other retention of urine: Secondary | ICD-10-CM | POA: Diagnosis not present

## 2020-03-18 DIAGNOSIS — Z8673 Personal history of transient ischemic attack (TIA), and cerebral infarction without residual deficits: Secondary | ICD-10-CM

## 2020-03-18 DIAGNOSIS — R0682 Tachypnea, not elsewhere classified: Secondary | ICD-10-CM

## 2020-03-18 DIAGNOSIS — Z781 Physical restraint status: Secondary | ICD-10-CM

## 2020-03-18 DIAGNOSIS — E1140 Type 2 diabetes mellitus with diabetic neuropathy, unspecified: Secondary | ICD-10-CM | POA: Diagnosis present

## 2020-03-18 DIAGNOSIS — R29818 Other symptoms and signs involving the nervous system: Secondary | ICD-10-CM | POA: Diagnosis not present

## 2020-03-18 DIAGNOSIS — I129 Hypertensive chronic kidney disease with stage 1 through stage 4 chronic kidney disease, or unspecified chronic kidney disease: Secondary | ICD-10-CM | POA: Diagnosis not present

## 2020-03-18 DIAGNOSIS — R29810 Facial weakness: Secondary | ICD-10-CM | POA: Diagnosis not present

## 2020-03-18 DIAGNOSIS — R509 Fever, unspecified: Secondary | ICD-10-CM

## 2020-03-18 DIAGNOSIS — Z96642 Presence of left artificial hip joint: Secondary | ICD-10-CM | POA: Diagnosis not present

## 2020-03-18 DIAGNOSIS — Z794 Long term (current) use of insulin: Secondary | ICD-10-CM

## 2020-03-18 DIAGNOSIS — I5032 Chronic diastolic (congestive) heart failure: Secondary | ICD-10-CM | POA: Diagnosis not present

## 2020-03-18 DIAGNOSIS — G08 Intracranial and intraspinal phlebitis and thrombophlebitis: Secondary | ICD-10-CM | POA: Diagnosis not present

## 2020-03-18 DIAGNOSIS — N132 Hydronephrosis with renal and ureteral calculous obstruction: Secondary | ICD-10-CM | POA: Diagnosis not present

## 2020-03-18 DIAGNOSIS — N135 Crossing vessel and stricture of ureter without hydronephrosis: Secondary | ICD-10-CM | POA: Diagnosis present

## 2020-03-18 DIAGNOSIS — E039 Hypothyroidism, unspecified: Secondary | ICD-10-CM | POA: Diagnosis present

## 2020-03-18 DIAGNOSIS — I82612 Acute embolism and thrombosis of superficial veins of left upper extremity: Secondary | ICD-10-CM | POA: Diagnosis not present

## 2020-03-18 DIAGNOSIS — Z96653 Presence of artificial knee joint, bilateral: Secondary | ICD-10-CM | POA: Diagnosis present

## 2020-03-18 DIAGNOSIS — I6782 Cerebral ischemia: Secondary | ICD-10-CM | POA: Diagnosis not present

## 2020-03-18 DIAGNOSIS — B955 Unspecified streptococcus as the cause of diseases classified elsewhere: Secondary | ICD-10-CM | POA: Diagnosis not present

## 2020-03-18 DIAGNOSIS — Z66 Do not resuscitate: Secondary | ICD-10-CM | POA: Diagnosis not present

## 2020-03-18 DIAGNOSIS — G928 Other toxic encephalopathy: Secondary | ICD-10-CM | POA: Diagnosis present

## 2020-03-18 DIAGNOSIS — Z452 Encounter for adjustment and management of vascular access device: Secondary | ICD-10-CM | POA: Diagnosis not present

## 2020-03-18 DIAGNOSIS — I6340 Cerebral infarction due to embolism of unspecified cerebral artery: Secondary | ICD-10-CM | POA: Diagnosis not present

## 2020-03-18 DIAGNOSIS — I69354 Hemiplegia and hemiparesis following cerebral infarction affecting left non-dominant side: Secondary | ICD-10-CM | POA: Diagnosis not present

## 2020-03-18 DIAGNOSIS — E1122 Type 2 diabetes mellitus with diabetic chronic kidney disease: Secondary | ICD-10-CM | POA: Diagnosis present

## 2020-03-18 DIAGNOSIS — I63212 Cerebral infarction due to unspecified occlusion or stenosis of left vertebral arteries: Secondary | ICD-10-CM | POA: Diagnosis not present

## 2020-03-18 DIAGNOSIS — R739 Hyperglycemia, unspecified: Secondary | ICD-10-CM | POA: Diagnosis not present

## 2020-03-18 DIAGNOSIS — Z885 Allergy status to narcotic agent status: Secondary | ICD-10-CM

## 2020-03-18 DIAGNOSIS — M25512 Pain in left shoulder: Secondary | ICD-10-CM | POA: Diagnosis not present

## 2020-03-18 DIAGNOSIS — I6381 Other cerebral infarction due to occlusion or stenosis of small artery: Secondary | ICD-10-CM | POA: Diagnosis present

## 2020-03-18 DIAGNOSIS — M79602 Pain in left arm: Secondary | ICD-10-CM | POA: Diagnosis not present

## 2020-03-18 DIAGNOSIS — B9562 Methicillin resistant Staphylococcus aureus infection as the cause of diseases classified elsewhere: Secondary | ICD-10-CM | POA: Diagnosis not present

## 2020-03-18 DIAGNOSIS — R5381 Other malaise: Secondary | ICD-10-CM | POA: Diagnosis not present

## 2020-03-18 DIAGNOSIS — B3749 Other urogenital candidiasis: Secondary | ICD-10-CM | POA: Diagnosis not present

## 2020-03-18 DIAGNOSIS — N179 Acute kidney failure, unspecified: Secondary | ICD-10-CM | POA: Diagnosis not present

## 2020-03-18 DIAGNOSIS — K219 Gastro-esophageal reflux disease without esophagitis: Secondary | ICD-10-CM | POA: Diagnosis present

## 2020-03-18 DIAGNOSIS — R0902 Hypoxemia: Secondary | ICD-10-CM | POA: Diagnosis not present

## 2020-03-18 DIAGNOSIS — R29730 NIHSS score 30: Secondary | ICD-10-CM | POA: Diagnosis present

## 2020-03-18 DIAGNOSIS — E875 Hyperkalemia: Secondary | ICD-10-CM | POA: Diagnosis not present

## 2020-03-18 DIAGNOSIS — N1330 Unspecified hydronephrosis: Secondary | ICD-10-CM | POA: Diagnosis present

## 2020-03-18 DIAGNOSIS — K5730 Diverticulosis of large intestine without perforation or abscess without bleeding: Secondary | ICD-10-CM | POA: Diagnosis not present

## 2020-03-18 DIAGNOSIS — I680 Cerebral amyloid angiopathy: Secondary | ICD-10-CM | POA: Diagnosis present

## 2020-03-18 DIAGNOSIS — E119 Type 2 diabetes mellitus without complications: Secondary | ICD-10-CM

## 2020-03-18 DIAGNOSIS — Z96611 Presence of right artificial shoulder joint: Secondary | ICD-10-CM | POA: Diagnosis present

## 2020-03-18 DIAGNOSIS — N3289 Other specified disorders of bladder: Secondary | ICD-10-CM | POA: Diagnosis not present

## 2020-03-18 DIAGNOSIS — R2990 Unspecified symptoms and signs involving the nervous system: Secondary | ICD-10-CM

## 2020-03-18 DIAGNOSIS — I63333 Cerebral infarction due to thrombosis of bilateral posterior cerebral arteries: Secondary | ICD-10-CM

## 2020-03-18 DIAGNOSIS — N390 Urinary tract infection, site not specified: Secondary | ICD-10-CM | POA: Diagnosis not present

## 2020-03-18 DIAGNOSIS — R4182 Altered mental status, unspecified: Secondary | ICD-10-CM

## 2020-03-18 DIAGNOSIS — R4702 Dysphasia: Secondary | ICD-10-CM | POA: Diagnosis present

## 2020-03-18 DIAGNOSIS — Z20822 Contact with and (suspected) exposure to covid-19: Secondary | ICD-10-CM | POA: Diagnosis not present

## 2020-03-18 DIAGNOSIS — Z7902 Long term (current) use of antithrombotics/antiplatelets: Secondary | ICD-10-CM

## 2020-03-18 DIAGNOSIS — N1832 Chronic kidney disease, stage 3b: Secondary | ICD-10-CM | POA: Diagnosis present

## 2020-03-18 DIAGNOSIS — I6501 Occlusion and stenosis of right vertebral artery: Secondary | ICD-10-CM | POA: Diagnosis not present

## 2020-03-18 DIAGNOSIS — R7989 Other specified abnormal findings of blood chemistry: Secondary | ICD-10-CM | POA: Diagnosis not present

## 2020-03-18 DIAGNOSIS — Z6841 Body Mass Index (BMI) 40.0 and over, adult: Secondary | ICD-10-CM

## 2020-03-18 DIAGNOSIS — Y848 Other medical procedures as the cause of abnormal reaction of the patient, or of later complication, without mention of misadventure at the time of the procedure: Secondary | ICD-10-CM | POA: Diagnosis not present

## 2020-03-18 DIAGNOSIS — Z86711 Personal history of pulmonary embolism: Secondary | ICD-10-CM

## 2020-03-18 DIAGNOSIS — K117 Disturbances of salivary secretion: Secondary | ICD-10-CM | POA: Diagnosis not present

## 2020-03-18 DIAGNOSIS — Z961 Presence of intraocular lens: Secondary | ICD-10-CM | POA: Diagnosis present

## 2020-03-18 DIAGNOSIS — R52 Pain, unspecified: Secondary | ICD-10-CM

## 2020-03-18 DIAGNOSIS — R279 Unspecified lack of coordination: Secondary | ICD-10-CM | POA: Diagnosis not present

## 2020-03-18 DIAGNOSIS — E860 Dehydration: Secondary | ICD-10-CM | POA: Diagnosis present

## 2020-03-18 DIAGNOSIS — I69391 Dysphagia following cerebral infarction: Secondary | ICD-10-CM | POA: Diagnosis not present

## 2020-03-18 DIAGNOSIS — Z79890 Hormone replacement therapy (postmenopausal): Secondary | ICD-10-CM

## 2020-03-18 DIAGNOSIS — Z79899 Other long term (current) drug therapy: Secondary | ICD-10-CM

## 2020-03-18 DIAGNOSIS — M79632 Pain in left forearm: Secondary | ICD-10-CM | POA: Diagnosis not present

## 2020-03-18 DIAGNOSIS — R54 Age-related physical debility: Secondary | ICD-10-CM | POA: Diagnosis present

## 2020-03-18 DIAGNOSIS — Z833 Family history of diabetes mellitus: Secondary | ICD-10-CM

## 2020-03-18 DIAGNOSIS — Z515 Encounter for palliative care: Secondary | ICD-10-CM | POA: Diagnosis not present

## 2020-03-18 DIAGNOSIS — I6322 Cerebral infarction due to unspecified occlusion or stenosis of basilar arteries: Secondary | ICD-10-CM | POA: Diagnosis not present

## 2020-03-18 DIAGNOSIS — T8089XA Other complications following infusion, transfusion and therapeutic injection, initial encounter: Secondary | ICD-10-CM | POA: Diagnosis not present

## 2020-03-18 DIAGNOSIS — I639 Cerebral infarction, unspecified: Secondary | ICD-10-CM | POA: Diagnosis not present

## 2020-03-18 DIAGNOSIS — I130 Hypertensive heart and chronic kidney disease with heart failure and stage 1 through stage 4 chronic kidney disease, or unspecified chronic kidney disease: Secondary | ICD-10-CM | POA: Diagnosis present

## 2020-03-18 DIAGNOSIS — B951 Streptococcus, group B, as the cause of diseases classified elsewhere: Secondary | ICD-10-CM | POA: Diagnosis present

## 2020-03-18 DIAGNOSIS — R531 Weakness: Secondary | ICD-10-CM | POA: Diagnosis not present

## 2020-03-18 DIAGNOSIS — E1151 Type 2 diabetes mellitus with diabetic peripheral angiopathy without gangrene: Secondary | ICD-10-CM | POA: Diagnosis present

## 2020-03-18 DIAGNOSIS — I10 Essential (primary) hypertension: Secondary | ICD-10-CM | POA: Diagnosis not present

## 2020-03-18 DIAGNOSIS — Z8249 Family history of ischemic heart disease and other diseases of the circulatory system: Secondary | ICD-10-CM

## 2020-03-18 DIAGNOSIS — R4189 Other symptoms and signs involving cognitive functions and awareness: Secondary | ICD-10-CM

## 2020-03-18 DIAGNOSIS — N1831 Chronic kidney disease, stage 3a: Secondary | ICD-10-CM | POA: Diagnosis not present

## 2020-03-18 DIAGNOSIS — N130 Hydronephrosis with ureteropelvic junction obstruction: Secondary | ICD-10-CM | POA: Diagnosis not present

## 2020-03-18 DIAGNOSIS — E1129 Type 2 diabetes mellitus with other diabetic kidney complication: Secondary | ICD-10-CM | POA: Diagnosis not present

## 2020-03-18 DIAGNOSIS — R4020 Unspecified coma: Secondary | ICD-10-CM | POA: Diagnosis not present

## 2020-03-18 DIAGNOSIS — T83092A Other mechanical complication of nephrostomy catheter, initial encounter: Secondary | ICD-10-CM | POA: Diagnosis not present

## 2020-03-18 DIAGNOSIS — T83022A Displacement of nephrostomy catheter, initial encounter: Secondary | ICD-10-CM | POA: Diagnosis not present

## 2020-03-18 DIAGNOSIS — Z96612 Presence of left artificial shoulder joint: Secondary | ICD-10-CM | POA: Diagnosis present

## 2020-03-18 DIAGNOSIS — R471 Dysarthria and anarthria: Secondary | ICD-10-CM | POA: Diagnosis present

## 2020-03-18 DIAGNOSIS — I517 Cardiomegaly: Secondary | ICD-10-CM | POA: Diagnosis not present

## 2020-03-18 DIAGNOSIS — R748 Abnormal levels of other serum enzymes: Secondary | ICD-10-CM | POA: Diagnosis not present

## 2020-03-18 DIAGNOSIS — Z9049 Acquired absence of other specified parts of digestive tract: Secondary | ICD-10-CM

## 2020-03-18 DIAGNOSIS — R400 Somnolence: Secondary | ICD-10-CM | POA: Diagnosis not present

## 2020-03-18 DIAGNOSIS — Z4682 Encounter for fitting and adjustment of non-vascular catheter: Secondary | ICD-10-CM | POA: Diagnosis not present

## 2020-03-18 DIAGNOSIS — Z9841 Cataract extraction status, right eye: Secondary | ICD-10-CM

## 2020-03-18 DIAGNOSIS — E1165 Type 2 diabetes mellitus with hyperglycemia: Secondary | ICD-10-CM | POA: Diagnosis present

## 2020-03-18 DIAGNOSIS — I6349 Cerebral infarction due to embolism of other cerebral artery: Secondary | ICD-10-CM | POA: Diagnosis not present

## 2020-03-18 DIAGNOSIS — N99522 Malfunction of other external stoma of urinary tract: Secondary | ICD-10-CM | POA: Diagnosis not present

## 2020-03-18 DIAGNOSIS — E785 Hyperlipidemia, unspecified: Secondary | ICD-10-CM | POA: Diagnosis present

## 2020-03-18 DIAGNOSIS — R1310 Dysphagia, unspecified: Secondary | ICD-10-CM | POA: Diagnosis not present

## 2020-03-18 DIAGNOSIS — Z888 Allergy status to other drugs, medicaments and biological substances status: Secondary | ICD-10-CM

## 2020-03-18 DIAGNOSIS — G9389 Other specified disorders of brain: Secondary | ICD-10-CM | POA: Diagnosis not present

## 2020-03-18 DIAGNOSIS — N200 Calculus of kidney: Secondary | ICD-10-CM | POA: Diagnosis not present

## 2020-03-18 DIAGNOSIS — E0781 Sick-euthyroid syndrome: Secondary | ICD-10-CM | POA: Diagnosis present

## 2020-03-18 LAB — URINALYSIS, ROUTINE W REFLEX MICROSCOPIC
Bilirubin Urine: NEGATIVE
Glucose, UA: NEGATIVE mg/dL
Ketones, ur: NEGATIVE mg/dL
Nitrite: NEGATIVE
Protein, ur: NEGATIVE mg/dL
Specific Gravity, Urine: 1.01 (ref 1.005–1.030)
pH: 5.5 (ref 5.0–8.0)

## 2020-03-18 LAB — TSH: TSH: 6.651 u[IU]/mL — ABNORMAL HIGH (ref 0.350–4.500)

## 2020-03-18 LAB — HEMOGLOBIN A1C
Hgb A1c MFr Bld: 7.6 % — ABNORMAL HIGH (ref 4.8–5.6)
Mean Plasma Glucose: 171.42 mg/dL

## 2020-03-18 LAB — COMPREHENSIVE METABOLIC PANEL
ALT: 11 U/L (ref 0–44)
AST: 20 U/L (ref 15–41)
Albumin: 2.9 g/dL — ABNORMAL LOW (ref 3.5–5.0)
Alkaline Phosphatase: 56 U/L (ref 38–126)
Anion gap: 11 (ref 5–15)
BUN: 24 mg/dL — ABNORMAL HIGH (ref 8–23)
CO2: 27 mmol/L (ref 22–32)
Calcium: 8.8 mg/dL — ABNORMAL LOW (ref 8.9–10.3)
Chloride: 101 mmol/L (ref 98–111)
Creatinine, Ser: 1.1 mg/dL — ABNORMAL HIGH (ref 0.44–1.00)
GFR, Estimated: 49 mL/min — ABNORMAL LOW (ref >60)
Glucose, Bld: 176 mg/dL — ABNORMAL HIGH (ref 70–99)
Potassium: 4.4 mmol/L (ref 3.5–5.1)
Sodium: 139 mmol/L (ref 135–145)
Total Bilirubin: 0.6 mg/dL (ref 0.3–1.2)
Total Protein: 5.5 g/dL — ABNORMAL LOW (ref 6.5–8.1)

## 2020-03-18 LAB — URINALYSIS, MICROSCOPIC (REFLEX)
Bacteria, UA: NONE SEEN
WBC, UA: >50 WBC/hpf (ref 0–5)

## 2020-03-18 LAB — I-STAT ARTERIAL BLOOD GAS, ED
Acid-Base Excess: 5 mmol/L — ABNORMAL HIGH (ref 0.0–2.0)
Bicarbonate: 32.6 mmol/L — ABNORMAL HIGH (ref 20.0–28.0)
Calcium, Ion: 1.29 mmol/L (ref 1.15–1.40)
HCT: 38 % (ref 36.0–46.0)
Hemoglobin: 12.9 g/dL (ref 12.0–15.0)
O2 Saturation: 96 %
Potassium: 4.5 mmol/L (ref 3.5–5.1)
Sodium: 138 mmol/L (ref 135–145)
TCO2: 34 mmol/L — ABNORMAL HIGH (ref 22–32)
pCO2 arterial: 61.4 mmHg — ABNORMAL HIGH (ref 32.0–48.0)
pH, Arterial: 7.333 — ABNORMAL LOW (ref 7.350–7.450)
pO2, Arterial: 92 mmHg (ref 83.0–108.0)

## 2020-03-18 LAB — DIFFERENTIAL
Abs Immature Granulocytes: 0.01 10*3/uL (ref 0.00–0.07)
Basophils Absolute: 0.1 10*3/uL (ref 0.0–0.1)
Basophils Relative: 1 %
Eosinophils Absolute: 0.1 10*3/uL (ref 0.0–0.5)
Eosinophils Relative: 3 %
Immature Granulocytes: 0 %
Lymphocytes Relative: 34 %
Lymphs Abs: 1.3 10*3/uL (ref 0.7–4.0)
Monocytes Absolute: 0.4 10*3/uL (ref 0.1–1.0)
Monocytes Relative: 10 %
Neutro Abs: 1.9 10*3/uL (ref 1.7–7.7)
Neutrophils Relative %: 52 %

## 2020-03-18 LAB — TROPONIN I (HIGH SENSITIVITY)
Troponin I (High Sensitivity): 50 ng/L — ABNORMAL HIGH (ref <18)
Troponin I (High Sensitivity): 193 ng/L (ref <18)

## 2020-03-18 LAB — GLUCOSE, CAPILLARY
Glucose-Capillary: 141 mg/dL — ABNORMAL HIGH (ref 70–99)
Glucose-Capillary: 141 mg/dL — ABNORMAL HIGH (ref 70–99)

## 2020-03-18 LAB — I-STAT CHEM 8, ED
BUN: 26 mg/dL — ABNORMAL HIGH (ref 8–23)
Calcium, Ion: 1.14 mmol/L — ABNORMAL LOW (ref 1.15–1.40)
Chloride: 101 mmol/L (ref 98–111)
Creatinine, Ser: 1 mg/dL (ref 0.44–1.00)
Glucose, Bld: 172 mg/dL — ABNORMAL HIGH (ref 70–99)
HCT: 36 % (ref 36.0–46.0)
Hemoglobin: 12.2 g/dL (ref 12.0–15.0)
Potassium: 4.2 mmol/L (ref 3.5–5.1)
Sodium: 139 mmol/L (ref 135–145)
TCO2: 27 mmol/L (ref 22–32)

## 2020-03-18 LAB — RESP PANEL BY RT-PCR (FLU A&B, COVID) ARPGX2
Influenza A by PCR: NEGATIVE
Influenza B by PCR: NEGATIVE
SARS Coronavirus 2 by RT PCR: NEGATIVE

## 2020-03-18 LAB — RAPID URINE DRUG SCREEN, HOSP PERFORMED
Amphetamines: NOT DETECTED
Barbiturates: NOT DETECTED
Benzodiazepines: NOT DETECTED
Cocaine: NOT DETECTED
Opiates: NOT DETECTED
Tetrahydrocannabinol: NOT DETECTED

## 2020-03-18 LAB — CBC
HCT: 37.7 % (ref 36.0–46.0)
Hemoglobin: 11.8 g/dL — ABNORMAL LOW (ref 12.0–15.0)
MCH: 31.9 pg (ref 26.0–34.0)
MCHC: 31.3 g/dL (ref 30.0–36.0)
MCV: 101.9 fL — ABNORMAL HIGH (ref 80.0–100.0)
Platelets: 182 10*3/uL (ref 150–400)
RBC: 3.7 MIL/uL — ABNORMAL LOW (ref 3.87–5.11)
RDW: 13.2 % (ref 11.5–15.5)
WBC: 3.8 10*3/uL — ABNORMAL LOW (ref 4.0–10.5)
nRBC: 0 % (ref 0.0–0.2)

## 2020-03-18 LAB — CBG MONITORING, ED: Glucose-Capillary: 156 mg/dL — ABNORMAL HIGH (ref 70–99)

## 2020-03-18 LAB — POCT I-STAT 7, (LYTES, BLD GAS, ICA,H+H)
Acid-Base Excess: 4 mmol/L — ABNORMAL HIGH (ref 0.0–2.0)
Bicarbonate: 28.9 mmol/L — ABNORMAL HIGH (ref 20.0–28.0)
Calcium, Ion: 1.25 mmol/L (ref 1.15–1.40)
HCT: 36 % (ref 36.0–46.0)
Hemoglobin: 12.2 g/dL (ref 12.0–15.0)
O2 Saturation: 97 %
Patient temperature: 96.4
Potassium: 3.9 mmol/L (ref 3.5–5.1)
Sodium: 139 mmol/L (ref 135–145)
TCO2: 30 mmol/L (ref 22–32)
pCO2 arterial: 42.5 mmHg (ref 32.0–48.0)
pH, Arterial: 7.436 (ref 7.350–7.450)
pO2, Arterial: 86 mmHg (ref 83.0–108.0)

## 2020-03-18 LAB — LACTIC ACID, PLASMA: Lactic Acid, Venous: 1.1 mmol/L (ref 0.5–1.9)

## 2020-03-18 LAB — APTT: aPTT: 26 seconds (ref 24–36)

## 2020-03-18 LAB — PHOSPHORUS: Phosphorus: 3.6 mg/dL (ref 2.5–4.6)

## 2020-03-18 LAB — MAGNESIUM: Magnesium: 1.7 mg/dL (ref 1.7–2.4)

## 2020-03-18 LAB — PROCALCITONIN: Procalcitonin: <0.1 ng/mL

## 2020-03-18 LAB — PROTIME-INR
INR: 1 (ref 0.8–1.2)
Prothrombin Time: 12.5 seconds (ref 11.4–15.2)

## 2020-03-18 LAB — AMMONIA: Ammonia: 24 umol/L (ref 9–35)

## 2020-03-18 LAB — MRSA PCR SCREENING: MRSA by PCR: POSITIVE — AB

## 2020-03-18 MED ORDER — HEPARIN SODIUM (PORCINE) 5000 UNIT/ML IJ SOLN
5000.0000 [IU] | Freq: Three times a day (TID) | INTRAMUSCULAR | Status: DC
  Administered 2020-03-18 – 2020-03-27 (×26): 5000 [IU] via SUBCUTANEOUS
  Filled 2020-03-18 (×26): qty 1

## 2020-03-18 MED ORDER — NOREPINEPHRINE 4 MG/250ML-% IV SOLN: 2.0000 ug/min | INTRAVENOUS | Status: DC

## 2020-03-18 MED ORDER — MUPIROCIN 2 % EX OINT
1.0000 "application " | TOPICAL_OINTMENT | Freq: Two times a day (BID) | CUTANEOUS | Status: AC
  Administered 2020-03-18 – 2020-03-23 (×10): 1 via NASAL
  Filled 2020-03-18 (×2): qty 22

## 2020-03-18 MED ORDER — SODIUM CHLORIDE 0.9 % IV SOLN
250.0000 mL | INTRAVENOUS | Status: DC
  Administered 2020-03-18 – 2020-04-22 (×4): 250 mL via INTRAVENOUS

## 2020-03-18 MED ORDER — SODIUM CHLORIDE 0.9% FLUSH
3.0000 mL | Freq: Once | INTRAVENOUS | Status: AC
Start: 2020-03-18 — End: 2020-04-18
  Administered 2020-04-18: 3 mL via INTRAVENOUS

## 2020-03-18 MED ORDER — HYDRALAZINE HCL 20 MG/ML IJ SOLN
10.0000 mg | INTRAMUSCULAR | Status: DC | PRN
Start: 2020-03-18 — End: 2020-03-18
  Administered 2020-03-18: 20 mg via INTRAVENOUS
  Filled 2020-03-18: qty 1

## 2020-03-18 MED ORDER — LABETALOL HCL 5 MG/ML IV SOLN: 20.0000 mg | INTRAVENOUS | Status: DC | PRN

## 2020-03-18 MED ORDER — SODIUM CHLORIDE 0.9 % IV SOLN
2.0000 g | INTRAVENOUS | Status: DC
  Administered 2020-03-18: 2 g via INTRAVENOUS
  Filled 2020-03-18: qty 0.1
  Filled 2020-03-18: qty 20

## 2020-03-18 MED ORDER — POLYETHYLENE GLYCOL 3350 17 G PO PACK: 17.0000 g | PACK | Freq: Every day | ORAL | Status: DC | PRN

## 2020-03-18 MED ORDER — MAGNESIUM SULFATE 2 GM/50ML IV SOLN
2.0000 g | Freq: Once | INTRAVENOUS | Status: AC
  Administered 2020-03-18: 2 g via INTRAVENOUS
  Filled 2020-03-18: qty 50

## 2020-03-18 MED ORDER — LACTATED RINGERS IV BOLUS
1000.0000 mL | Freq: Once | INTRAVENOUS | Status: AC
  Administered 2020-03-18: 1000 mL via INTRAVENOUS

## 2020-03-18 MED ORDER — DOCUSATE SODIUM 100 MG PO CAPS: 100.0000 mg | ORAL_CAPSULE | Freq: Two times a day (BID) | ORAL | Status: DC | PRN

## 2020-03-18 MED ORDER — EPINEPHRINE HCL 5 MG/250ML IV SOLN IN NS: 0.5000 ug/min | INTRAVENOUS | Status: DC

## 2020-03-18 MED ORDER — INSULIN ASPART 100 UNIT/ML ~~LOC~~ SOLN
0.0000 [IU] | SUBCUTANEOUS | Status: DC
  Administered 2020-03-18 – 2020-03-19 (×4): 1 [IU] via SUBCUTANEOUS
  Administered 2020-03-19 (×2): 2 [IU] via SUBCUTANEOUS
  Administered 2020-03-20 (×2): 1 [IU] via SUBCUTANEOUS
  Administered 2020-03-20: 2 [IU] via SUBCUTANEOUS
  Administered 2020-03-20 (×2): 1 [IU] via SUBCUTANEOUS
  Administered 2020-03-21: 3 [IU] via SUBCUTANEOUS
  Administered 2020-03-21 – 2020-03-22 (×6): 2 [IU] via SUBCUTANEOUS
  Administered 2020-03-22: 3 [IU] via SUBCUTANEOUS
  Administered 2020-03-22 (×2): 2 [IU] via SUBCUTANEOUS
  Administered 2020-03-22 (×2): 3 [IU] via SUBCUTANEOUS
  Administered 2020-03-23 (×4): 2 [IU] via SUBCUTANEOUS
  Administered 2020-03-23: 3 [IU] via SUBCUTANEOUS
  Administered 2020-03-23 – 2020-03-24 (×2): 2 [IU] via SUBCUTANEOUS
  Administered 2020-03-24: 1 [IU] via SUBCUTANEOUS
  Administered 2020-03-24 (×4): 2 [IU] via SUBCUTANEOUS
  Administered 2020-03-25: 3 [IU] via SUBCUTANEOUS
  Administered 2020-03-25: 2 [IU] via SUBCUTANEOUS
  Administered 2020-03-25: 3 [IU] via SUBCUTANEOUS
  Administered 2020-03-25: 2 [IU] via SUBCUTANEOUS
  Administered 2020-03-25: 3 [IU] via SUBCUTANEOUS
  Administered 2020-03-25: 5 [IU] via SUBCUTANEOUS
  Administered 2020-03-26 (×2): 2 [IU] via SUBCUTANEOUS
  Administered 2020-03-26: 3 [IU] via SUBCUTANEOUS
  Administered 2020-03-26 (×2): 2 [IU] via SUBCUTANEOUS
  Administered 2020-03-27: 1 [IU] via SUBCUTANEOUS
  Administered 2020-03-27: 3 [IU] via SUBCUTANEOUS
  Administered 2020-03-27 (×2): 2 [IU] via SUBCUTANEOUS
  Administered 2020-03-27: 1 [IU] via SUBCUTANEOUS
  Administered 2020-03-27 – 2020-03-28 (×3): 2 [IU] via SUBCUTANEOUS
  Administered 2020-03-28: 1 [IU] via SUBCUTANEOUS
  Administered 2020-03-28 (×2): 2 [IU] via SUBCUTANEOUS
  Administered 2020-03-28: 1 [IU] via SUBCUTANEOUS
  Administered 2020-03-28 – 2020-03-29 (×3): 2 [IU] via SUBCUTANEOUS

## 2020-03-18 MED ORDER — CHLORHEXIDINE GLUCONATE CLOTH 2 % EX PADS
6.0000 | MEDICATED_PAD | Freq: Every day | CUTANEOUS | Status: DC
  Administered 2020-03-18 – 2020-05-01 (×42): 6 via TOPICAL

## 2020-03-18 MED ORDER — IOHEXOL 350 MG/ML SOLN
90.0000 mL | Freq: Once | INTRAVENOUS | Status: AC | PRN
  Administered 2020-03-18: 90 mL via INTRAVENOUS

## 2020-03-18 MED ORDER — SODIUM CHLORIDE 0.9 % IV SOLN
500.0000 mg | INTRAVENOUS | Status: DC
  Administered 2020-03-18: 500 mg via INTRAVENOUS
  Filled 2020-03-18 (×2): qty 500

## 2020-03-18 NOTE — H&P (Signed)
NAME:  Hannah Fischer, MRN:  [id number], DOB:  [birth year], LOS: 0 ADMISSION DATE:  03/18/2020, CONSULTATION DATE:  03/18/2020 REFERRING MD:  Dr. Stevie Kern, CHIEF COMPLAINT:  AMS  Brief History:  87 year old female presenting from home with acute altered mental status, stroke workup negative thus far.  Pending workup but no obvious etiology.  Given her AMS and sonorus respirations, PCCM consulted for ICU admit for concern for airway protection.   History of Present Illness:  HPI obtained from medical chart review and per husband at bedside as patient remains encephalopathic.   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   Husband reports patient was in her normal state of health yesterday, as they went bowling and out to dinner.  Went to bed around 1130 to midnight but today, he was unable to wake her up around noon.  Husband reports no new meds or recent complaints, illness, or urinary issues.  Reports her thyroid dose was recently changed but then changed back one week later.    In ER, was evaluated by stroke team on arrival. Noted to be afebrile, bradycardic ~50's and hypertensive 194/70, saturations 100% on 2L [place].  Labs noted for glucose 176, BUN 24, sCr around baseline 1.10, albumin 2.9, protein 5.5, WBC 3.8, Hgb 11.8, normal coags, UA not fully complete but neg for nitrates, WBC >50, CXR without acute findings- noted chronic elevation of right hemidiaphragm, EKG showing sinus bradycardia with prolonged QTc.  Thus far, imaging has been negative for LVO with CTH, CTA head/ neck and perfusion.  Her encephalopathy is ongoing, EDP concerned for ability for air way protection with her encephalopathy, ask PCCM to eval for intubation/ ICU admit.   Past  Medical History:  HTN, HLD, DM, GERD, hypothyroidism,  cerebral embolic strokes in 11/2019 with residual left hand weakness, DJD, anemia, COVID (02/2018 per husband), PE (2013), IBS, and UTI's on chronic nitrofurantoin, chronic pain  Significant Hospital Events:  2/12 Admit   Consults:  Neurology   Procedures:   Significant Diagnostic Tests:  2/12 [facility] >> No evidence of acute intracranial abnormality.  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  Redemonstrated chronic encephalomalacia within the anterior right temporal lobe.  Stable background mild-to-moderate cerebral atrophy and chronic small vessel ischemic disease.  2/12 CTA head/ neck/ perfusion >> CTA neck: 1. Significantly limited evaluation due to motion degradation and suboptimal contrast timing. 2. The common and internal carotid arteries are patent within the neck. Moderate/severe calcified plaque within the right carotid bifurcation and proximal ICA. Quantification of stenosis at this site is not possible due to the degree of motion degradation. Calcified plaque within the left carotid bifurcation and proximal ICA with estimated up to 60% stenosis of the proximal left ICA. 3. Streak and beam hardening artifact precludes adequate evaluation of the proximal vertebral arteries. Within this limitation, the vertebral arteries are patent within the neck. However, there is gradually diminishing enhancement within the mid to distal cervical right vertebral artery and the intracranial right vertebral artery now appears highly stenosed or occluded (however, this could be artifactual in the setting of suboptimal contrast timing).  CTA head: 1. Limited evaluation due to suboptimal contrast timing. 2. Paucity of enhancement within the non dominant intracranial right vertebral artery.  This vessel may now be highly stenosed or occluded (however, this could be artifactual in the setting of suboptimal contrast  timing). 3. Moderate/severe stenosis within a mid M2 left MCA branch vessel.  4. Elsewhere, no intracranial large vessel occlusion or proximal high-grade arterial stenosis is identified. 5. Prominent calcified plaque within the intracranial ICAs bilaterally with suspected moderate bilateral stenosis.  CT perfusion head: The perfusion software identifies no core infarct. The perfusion software identifies no critically hypoperfused parenchyma utilizing the Tmax>6 seconds threshold.  Micro Data:  2/12 SARS 2 >> 2/12 BCx2 >> 2/12 UC >>  Antimicrobials:  2/12 azithro >> 2/12 ceftriaxone >>  Interim History / Subjective:    Objective   Blood pressure (!) 191/93, pulse (!) 51, temperature 97.8 F (36.6 C), temperature source Oral, resp. rate 18, height 5\' 3"  (1.6 m), weight 78.9 kg, SpO2 100 %.       No intake or output data in the 24 hours ending 03/18/20 1707 Filed Weights   03/18/20 1400 03/18/20 1446  Weight: 78.9 kg 78.9 kg   Examination: General:  Elderly female lying in bed in NAD but sonorus  HEENT: MM pink/moist, pupils 3/sluggish, no drooling, neck supple Neuro: does not respond to verbal, will localize with RUE, moves LE spont, not as much movement in LUE noted, at times will mumble but does not open eyes CV: rr, SB PULM:  Sonorous, positional, nasal trumpet placed with improvement in sonrous resp but patient agitated and coughing, removed for concern of vomiting, afterwards, sat upright in bed, slightly more responsive and currently protecting her airway  GI: soft, NT, purwick Extremities: warm/dry, no LE edema  Skin: no rashes   Resolved Hospital Problem list    Assessment & Plan:   Acute encephalopathy, etiology unclear at this time Hx of prior 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  Does take trazodone  at home, but husband states is not new and no concern for over usage/ accidental OD on meds.  - imaging thus far negative for LVO,  MRI when airway more stable  - EEG r/o seizure (hx of seizure at age 29) - checking TSH, ammonia, UDS - seizure precautions - serial neuro exams - neurology following, appreciate input   HTN - labetalol prn for SBP > 180, may need cleviprex if ongoing - hold home amlodipine, plavix, losartan, propranolol, crestor while altered/ NPO   At risk for airway insufficiency given encephalopathy  - monitor airway in ICU, no significant O2 requirements - NPO - nasal trumpet placed but patient gagging, removed for risk of vomiting/ aspiration  - aspiration precautions - recheck ABG at 2000   Prolonged QTC - avoid QTc prolonging measures    Leukopenia - Possible sepsis, source unclear, afebrile - check PCT - follow cultures, added UC - empiric ceftriaxone/ azithro for now - trend WBC/ fever curve   DM - CBG and SSI sensitive   Hypothyroidism - checking TSH, hold home synthroid  Chronic pyuria - UC pending - on chronic nitrofurantoin   Best practice (evaluated daily)  Diet: NPO Pain/Anxiety/Delirium protocol (if indicated): n/a VAP protocol (if indicated): n/a DVT prophylaxis: heparin SQ/ SCDs GI prophylaxis: n/a Glucose control: SSI  Mobility: BR Disposition:admit to ICU   Goals of Care:  Last date of multidisciplinary goals of care discussion:2/12 Family and staff present: husband, Dr. 9, myself Summary of discussion: Full  code Follow up goals of care discussion due: 2/18 Code Status: full   Labs   CBC: Recent Labs  Lab 03/18/20 1417 03/18/20 1420  WBC  --  3.8*  NEUTROABS  --  1.9  HGB 12.2 11.8*  HCT 36.0 37.7  MCV  --  101.9*  PLT  --  182    Basic Metabolic Panel: Recent Labs  Lab 03/18/20 1417 03/18/20 1420  NA 139 139  K 4.2 4.4  CL 101 101  CO2  --  27  GLUCOSE 172* 176*  BUN 26* 24*  CREATININE 1.00  1.10*  CALCIUM  --  8.8*   GFR: Estimated Creatinine Clearance: 37.2 mL/min (A) (by C-G formula based on SCr of 1.1 mg/dL (H)). Recent Labs  Lab 03/18/20 1420 03/18/20 1615  WBC 3.8*  --   LATICACIDVEN  --  1.1    Liver Function Tests: Recent Labs  Lab 03/18/20 1420  AST 20  ALT 11  ALKPHOS 56  BILITOT 0.6  PROT 5.5*  ALBUMIN 2.9*   No results for input(s): LIPASE, AMYLASE in the last 168 hours. No results for input(s): AMMONIA in the last 168 hours.  ABG    Component Value Date/Time   TCO2 27 03/18/2020 1417     Coagulation Profile: Recent Labs  Lab 03/18/20 1420  INR 1.0    Cardiac Enzymes: No results for input(s): CKTOTAL, CKMB, CKMBINDEX, TROPONINI in the last 168 hours.  HbA1C: Hgb A1c MFr Bld  Date/Time Value Ref Range Status  11/23/2019 11:04 PM 7.5 (H) 4.8 - 5.6 % Final    Comment:    (NOTE)         Prediabetes: 5.7 - 6.4         Diabetes: >6.4         Glycemic control for adults with diabetes: <7.0   06/15/2019 04:23 PM 6.9 (H) 4.8 - 5.6 % Final    Comment:             Prediabetes: 5.7 - 6.4          Diabetes: >6.4          Glycemic control for adults with diabetes: <7.0     CBG: Recent Labs  Lab 03/18/20 1443  GLUCAP 156*    Review of Systems:   Unable to obtain from patient given encephalopathy   Past Medical History:  She,  has a past medical history of Abnormal nuclear stress test, Anemia, Anxiety, Arthritis, Arthritis of shoulder region, left, degenerative (12/03/2013), Blood transfusion (1979), Chest pain, Chronic pain syndrome (04/29/2019), Chronic right-sided low back pain without sciatica (04/12/2019), Complication of anesthesia, Diabetes mellitus, Diabetes mellitus (HCC) (11/15/2011), Diverticular disease, DJD (degenerative joint disease), GERD (gastroesophageal reflux disease), Hyperlipemia, Hypertension, Hypothyroidism, Irritable bowel syndrome, Kidney agenesis, Neuromuscular disorder (HCC), Neuropathy in diabetes (HCC),  Nonspecific abnormal electrocardiogram (ECG) (EKG) (10/15/2013), Other and unspecified angina pectoris, Pulmonary embolism (HCC) (06-2010), Pyuria (11/15/2011), Seizures (HCC), and TIA (transient ischemic attack) (11/15/2011).   Surgical History:   Past Surgical History:  Procedure Laterality Date  . BACK SURGERY    . BLEPHAROPLASTY  2012   rt eye  . CARDIAC CATHETERIZATION     had one 10/2013  . CHOLECYSTECTOMY    . EYE SURGERY  2007   cataract ext/ iol rt eye  . JOINT REPLACEMENT  2001,2005   both knees  . LEFT HEART CATHETERIZATION WITH CORONARY ANGIOGRAM N/A 10/18/2013   Procedure: LEFT HEART CATHETERIZATION WITH CORONARY ANGIOGRAM;  Surgeon: Corky CraftsJayadeep S Varanasi,  MD;  Location: [facility] CATH LAB;  Service: Cardiovascular;  Laterality: N/A;  . LUMBAR DISC SURGERY  1990  . REVERSE SHOULDER ARTHROPLASTY Left 12/03/2013   Procedure: LEFT SHOULDER REVERSE ARTHROPLASTY;  Surgeon: Verlee Rossetti, MD;  Location: [facility] OR;  Service: Orthopedics;  Laterality: Left;  . SHOULDER HEMI-ARTHROPLASTY  12/25/2010   Procedure: SHOULDER HEMI-ARTHROPLASTY;  Surgeon: Cammy Copa;  Location: [facility] OR;  Service: Orthopedics;  Laterality: Right;     Social History:   reports that she has never smoked. She has never used smokeless tobacco. She reports that she does not drink alcohol and does not use drugs.   Family History:  Her family history includes Diabetes in her mother; Heart disease in her brother, mother, and sister.   Allergies Allergies  Allergen Reactions  . Atorvastatin Other (See Comments)    "Aches"  . Ezetimibe-Simvastatin Other (See Comments)    "Aches"  . Lovastatin Other (See Comments)    Weakness  . Metformin Hcl Diarrhea  . Pravastatin Other (See Comments)    Weakness  . Codeine Anxiety and Other (See Comments)    Keeps the patient awake  . Simvastatin Nausea Only and Other (See Comments)    Muscle weakness and an stomach upset  . Sulfa Antibiotics Other (See Comments)     Gastric intolerance and caused stomach upset  . Sulfites Other (See Comments)    Abdominal pain     Home Medications  Prior to Admission medications   Medication Sig Start Date End Date Taking? Authorizing Provider  amLODipine (NORVASC) 5 MG tablet Take 5 mg by mouth every morning. 12/22/19  Yes [provider]  B Complex Vitamins (VITAMIN B COMPLEX) TABS Take 1 tablet by mouth daily after breakfast.   Yes [provider]  Cholecalciferol (VITAMIN D3) 2000 UNITS TABS Take 2,000 Units by mouth daily.    Yes [provider]  clopidogrel (PLAVIX) 75 MG tablet Take 1 tablet (75 mg total) by mouth daily with breakfast. 11/17/11  Yes Vassie Loll, MD  DULoxetine (CYMBALTA) 30 MG capsule TAKE 1 CAPSULE(30 MG) BY MOUTH DAILY Patient taking differently: Take 30 mg by mouth daily. 08/25/19  Yes Marcello Fennel, MD  levothyroxine (SYNTHROID) 112 MCG tablet Take 112 mcg by mouth daily before breakfast.   Yes [provider]  losartan (COZAAR) 100 MG tablet Take 100 mg by mouth daily. 04/29/19  Yes [provider]  nitrofurantoin (MACRODANTIN) 100 MG capsule Take 100 mg by mouth daily. 03/09/20  Yes [provider]  Omega-3 Fatty Acids (FISH OIL) 1000 MG CAPS Take 1,000 mg by mouth 2 (two) times daily.   Yes [provider]  pioglitazone (ACTOS) 45 MG tablet Take 45 mg by mouth daily.   Yes [provider]  propranolol (INDERAL) 40 MG tablet Take 40 mg by mouth daily.  04/13/17  Yes [provider]  traZODone (DESYREL) 50 MG tablet TAKE 1 TABLET BY MOUTH AT BEDTIME Patient taking differently: Take 50 mg by mouth at bedtime. 07/01/17  Yes Patel, Maryln Gottron, MD  levothyroxine (SYNTHROID, LEVOTHROID) 100 MCG tablet Take 1 tablet (100 mcg total) by mouth daily. Patient not taking: No sig reported 11/17/11   Vassie Loll, MD  methocarbamol (ROBAXIN) 500 MG tablet Take 1 tablet (500 mg total) by mouth every 8 (eight) hours as  needed for muscle spasms. Patient not taking: No sig reported 04/13/19   Marcello Fennel, MD  rosuvastatin (CRESTOR) 5 MG tablet Take 1 tablet (5 mg  total) by mouth 2 (two) times a week for 30 doses. 11/29/19 03/10/20  Azucena Fallen, MD     Critical care time: 60 mins       Posey Boyer, ACNP [facility] 03/18/2020, 6:15 PM

## 2020-03-18 NOTE — ED Triage Notes (Signed)
Pt BIB [facility] from home. Pt LSW midnight last night when she went to sleep. Pt's husband woke up this morning, pt was still sleeping. Husband checked on her and pt was unresponsive. Pt responsive to pain on arrival. bilat 18G. Hx of stroke with L sided deficits.

## 2020-03-18 NOTE — ED Notes (Signed)
Placed on EtCO2 Nasal Cannula

## 2020-03-18 NOTE — Progress Notes (Signed)
Nasal trumpet inserted in left nare per verbal order by PCCM. Respirations improved post.

## 2020-03-18 NOTE — Progress Notes (Signed)
Elink notified of critical troponin 193, +MRSA result. Rectal temp 96.45F, bair hugger placed on pt.

## 2020-03-18 NOTE — Progress Notes (Addendum)
eLink Physician-Brief Progress Note Patient Name: Hannah Fischer DOB: 04-03-1932 MRN: [id number]   Date of Service  03/18/2020  HPI/Events of Note  Patient admitted earlier today with encephalopathy. Had been hypertensive to 190s and borderline bradycardic (sinus brady) to 50 bpm. Received a dose of hydralazine for her BP and about 20 minutes later was found to be bradycardic to 35-45 bpm and SBP in 110s. EKG shows sinus bradycardia with occasional PACs. PR interval .  Patient has been unarousable since time of admission. ABG performed 2 hours ago showed mild hypercarbic respiratory failure. She is not a BiPAP candidate due to mental status.   eICU Interventions  Repeat ABG now to determine trajectory of hypercarbia and whether it might be contributing to her present clinical picture. If worsening, will likely require intubation.  Keep pacing pads on patient for now but no indication for pacing at present.   ADDENDUM: - ABG has actually spontaneously improved: 7.43/42/86/30/BE +4.0. - However, BP now 93/43 (MAP 58) but HR has actually improved to 58 bpm. - Give 1L LR bolus now. May also start levophed while bolus infuses if there is not a brisk improvement in BP with start of bolus (would have used epinephrine but no central access at this time - order will be changed by pharmacy). - Ground team notified about patient and will see patient shortly. - She does have a UA suggestive of UTI raising the possibility of a dense hypoactive delirium vs non-convulsive status epilepticus as potential etiologies for her mental status.  ADDENDUM 03/18/20 9:07 PM - Changed EEG order to STAT. - Mag level 1.7. Ordered 2g magnesium sulfate IV. - Trop 50 -> 193 over 5 hours. Presumably a small amount of demand-ischemia in setting of hypertension on arrival (194/70). EKG without acute ischemic changes. - Discontinued labetalol PRN order for now given the patient's brisk response to 20mg  dose administered  earlier. If she has recurrent hypertension, RN to notify MD who will evaluate and prescribe as appropriate at that time.  Intervention Category Major Interventions: Arrhythmia - evaluation and management;Respiratory failure - evaluation and management  03/18/2020, 7:24 PM

## 2020-03-18 NOTE — Consult Note (Signed)
Reason for Consult: Code stroke Referring Physician: Juventino Fischer Hannah Fischer is an 87 y.o. female.  HPI: Hannah Fischer is a 87 year old Caucasian lady with past medical history of hypertension, hyperlipidemia, by cerebral embolic strokes in October 2021 with residual mild left hand weakness, degenerative joint disease who was last seen normal at midnight her husband when she went to sleep.  This morning when the husband Hoka woke up she was still sleeping.  By noon when she did wake up husband tried to wake her up and she was unresponsive.  EMS was called.  Her CBG and blood pressure and vital signs were stable but she was unresponsive not holding her left arm well.  She was seen by me upon arrival his code stroke and was stuporous and barely arouse with sternal rub she was localized with right arm leg and left leg good movements but had mild weakness in the left arm.  She has taken for emergent CT scan of the head which showed no acute abnormality my changes are small vessel disease.  Emergent CT angiogram was obtained which was suboptimal due to timing of bolus contrast showed no obvious large vessel occlusion in the neck of the brain.  The terminal right vertebral artery had diminished flow.  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  CT perfusion scan showed no core infarct or perfusion deficit to salvage Last seen normal midnight 03/17/2020 IV TPA : No is outside the window Mechanical thrombectomy : No as no LVO  Past Medical History:  Diagnosis Date  . Abnormal nuclear stress test    2005, normal cath Dr. Elease Hashimoto, normal stress test 2012 Dr. Eldridge Dace  . Anemia    chronic  . Anxiety   . Arthritis    djd  . Arthritis of shoulder region, left, degenerative 12/03/2013  . Blood transfusion 1979   for anemia  . Chest pain    evaluated @ [facility] cardiology; had a stress test 2012  . Chronic pain syndrome 04/29/2019  . Chronic right-sided low back pain without sciatica  04/12/2019  . Complication of anesthesia    unable to breathe lying  flat on back  . Diabetes mellitus    type 2  niddm x 25 yrs  . Diabetes mellitus (HCC) 11/15/2011  . Diverticular disease   . DJD (degenerative joint disease)    lumbar and cervical  . GERD (gastroesophageal reflux disease)   . Hyperlipemia   . Hypertension    on medication  . Hypothyroidism    takes levoxyl  . Irritable bowel syndrome   . 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 Neuromuscular disorder (HCC)   . Neuropathy in diabetes (HCC)   . Nonspecific abnormal electrocardiogram (ECG) (EKG) 10/15/2013  . Other and unspecified angina pectoris    tests came back NOT heart related  . Pulmonary embolism (HCC) 06-2010   bilateral  . Pyuria 11/15/2011  . Seizures (HCC)    once, age 10  . TIA (transient ischemic attack) 11/15/2011   Review of systems unobtainable due to patient condition but as documented in history of present illness. Past Surgical History:  Procedure Laterality Date  . BACK SURGERY    . BLEPHAROPLASTY  2012   rt eye  . CARDIAC CATHETERIZATION     had one 10/2013  . CHOLECYSTECTOMY    . EYE SURGERY  2007   cataract ext/ iol rt eye  . JOINT REPLACEMENT  [id number]  both knees  . LEFT HEART CATHETERIZATION WITH CORONARY ANGIOGRAM N/A 10/18/2013   Procedure: LEFT HEART CATHETERIZATION WITH CORONARY ANGIOGRAM;  Surgeon: Corky Crafts, MD;  Location: [facility] CATH LAB;  Service: Cardiovascular;  Laterality: N/A;  . LUMBAR DISC SURGERY  1990  . REVERSE SHOULDER ARTHROPLASTY Left 12/03/2013   Procedure: LEFT SHOULDER REVERSE ARTHROPLASTY;  Surgeon: Verlee Rossetti, MD;  Location: [facility] OR;  Service: Orthopedics;  Laterality: Left;  . SHOULDER HEMI-ARTHROPLASTY  12/25/2010   Procedure: SHOULDER HEMI-ARTHROPLASTY;  Surgeon: Cammy Copa;  Location: [facility] OR;  Service: Orthopedics;  Laterality: Right;    Family History  Problem Relation Age of Onset  . Diabetes Mother   . Heart disease  Mother   . Heart disease Sister   . Heart disease Brother     Social History:  reports that she has never smoked. She has never used smokeless tobacco. She reports that she does not drink alcohol and does not use drugs.  Allergies:  Allergies  Allergen Reactions  . Atorvastatin Other (See Comments)    "Aches"  . Ezetimibe-Simvastatin Other (See Comments)    "Aches"  . Lovastatin Other (See Comments)    Weakness  . Metformin Hcl Diarrhea  . Pravastatin Other (See Comments)    Weakness  . Codeine Anxiety    Other reaction(s): keeps her awake  . Simvastatin Nausea Only and Other (See Comments)    Muscle weakness and an stomach upset  . Sulfa Antibiotics Other (See Comments)    Gastric intolerance Other reaction(s): stomach upset  . Sulfites Other (See Comments)    Abdominal pain    Medications: I have reviewed the patient's current medications.  Results for orders placed or performed during the hospital encounter of 03/18/20 (from the past 48 hour(s))  I-stat chem 8, ED     Status: Abnormal   Collection Time: 03/18/20  2:17 PM  Result Value Ref Range   Sodium 139 135 - 145 mmol/L   Potassium 4.2 3.5 - 5.1 mmol/L   Chloride 101 98 - 111 mmol/L   BUN 26 (H) 8 - 23 mg/dL   Creatinine, Ser 1.61 0.44 - 1.00 mg/dL   Glucose, Bld 096 (H) 70 - 99 mg/dL    Comment: Glucose reference range applies only to samples taken after fasting for at least 8 hours.   Calcium, Ion 1.14 (L) 1.15 - 1.40 mmol/L   TCO2 27 22 - 32 mmol/L   Hemoglobin 12.2 12.0 - 15.0 g/dL   HCT 04.5 40.9 - 81.1 %  Protime-INR     Status: None   Collection Time: 03/18/20  2:20 PM  Result Value Ref Range   Prothrombin Time 12.5 11.4 - 15.2 seconds   INR 1.0 0.8 - 1.2    Comment: (NOTE) INR goal varies based on device and disease states. Performed at [facility], [address], [place], [place] [address]   APTT     Status: None   Collection Time: 03/18/20  2:20 PM  Result Value Ref Range   aPTT 26  24 - 36 seconds    Comment: Performed at [facility], [address], [place], [place] [address]  CBC     Status: Abnormal   Collection Time: 03/18/20  2:20 PM  Result Value Ref Range   WBC 3.8 (L) 4.0 - 10.5 K/uL   RBC 3.70 (L) 3.87 - 5.11 MIL/uL   Hemoglobin 11.8 (L) 12.0 - 15.0 g/dL   HCT 13.0 86.5 -  46.0 %   MCV 101.9 (H) 80.0 - 100.0 fL   MCH 31.9 26.0 - 34.0 pg   MCHC 31.3 30.0 - 36.0 g/dL   RDW 16.1 09.6 - 04.5 %   Platelets 182 150 - 400 K/uL   nRBC 0.0 0.0 - 0.2 %    Comment: Performed at [facility], [address] [place], [place] [address]  Differential     Status: None   Collection Time: 03/18/20  2:20 PM  Result Value Ref Range   Neutrophils Relative % 52 %   Neutro Abs 1.9 1.7 - 7.7 K/uL   Lymphocytes Relative 34 %   Lymphs Abs 1.3 0.7 - 4.0 K/uL   Monocytes Relative 10 %   Monocytes Absolute 0.4 0.1 - 1.0 K/uL   Eosinophils Relative 3 %   Eosinophils Absolute 0.1 0.0 - 0.5 K/uL   Basophils Relative 1 %   Basophils Absolute 0.1 0.0 - 0.1 K/uL   Immature Granulocytes 0 %   Abs Immature Granulocytes 0.01 0.00 - 0.07 K/uL    Comment: Performed at [facility], [address], [place], [place] [address]  Comprehensive metabolic panel     Status: Abnormal   Collection Time: 03/18/20  2:20 PM  Result Value Ref Range   Sodium 139 135 - 145 mmol/L   Potassium 4.4 3.5 - 5.1 mmol/L   Chloride 101 98 - 111 mmol/L   CO2 27 22 - 32 mmol/L   Glucose, Bld 176 (H) 70 - 99 mg/dL    Comment: Glucose reference range applies only to samples taken after fasting for at least 8 hours.   BUN 24 (H) 8 - 23 mg/dL   Creatinine, Ser 8.29 (H) 0.44 - 1.00 mg/dL   Calcium 8.8 (L) 8.9 - 10.3 mg/dL   Total Protein 5.5 (L) 6.5 - 8.1 g/dL   Albumin 2.9 (L) 3.5 - 5.0 g/dL   AST 20 15 - 41 U/L   ALT 11 0 - 44 U/L   Alkaline Phosphatase 56 38 - 126 U/L   Total Bilirubin 0.6 0.3 - 1.2 mg/dL   GFR, Estimated 49 (L) >60 mL/min    Comment: (NOTE) Calculated using the  CKD-EPI Creatinine Equation (2021)    Anion gap 11 5 - 15    Comment: Performed at [facility], [address], [place], [place] [address]  CBG monitoring, ED     Status: Abnormal   Collection Time: 03/18/20  2:43 PM  Result Value Ref Range   Glucose-Capillary 156 (H) 70 - 99 mg/dL    Comment: Glucose reference range applies only to samples taken after fasting for at least 8 hours.    CT Code Stroke CTA Head W/WO contrast  Addendum Date: 03/18/2020   ADDENDUM REPORT: 03/18/2020 15:27 ADDENDUM: These results were called by telephone at the time of interpretation on 03/18/2020 at 3:15 pm to provider PRAMOD SETHI , who verbally acknowledged these results. Electronically Signed   By: Jackey Loge DO   On: 03/18/2020 15:27   Result Date: 03/18/2020 CLINICAL DATA:  Stroke, follow-up. EXAM: CT ANGIOGRAPHY HEAD AND NECK CT PERFUSION BRAIN TECHNIQUE: Multidetector CT imaging of the head and neck was performed using the standard protocol during bolus administration of intravenous contrast. Multiplanar CT image reconstructions and MIPs were obtained to evaluate the vascular anatomy. Carotid stenosis measurements (when applicable) are obtained utilizing NASCET criteria, using the distal internal carotid diameter as the denominator. Multiphase CT imaging of the brain  was performed following IV bolus contrast injection. Subsequent parametric perfusion maps were calculated using RAPID software. CONTRAST:  90mL OMNIPAQUE IOHEXOL 350 MG/ML SOLN COMPARISON:  Noncontrast head CT performed earlier today 03/18/2020. MRI/MRA head and MRA neck 11/24/2019. FINDINGS: CTA NECK FINDINGS Significantly limited evaluation due to poor contrast bolus timing and motion degradation. Aortic arch: Atherosclerotic plaque within the visualized aortic arch and proximal major branch vessels of the neck. Within described limitations, no definite hemodynamically significant stenosis of the innominate or proximal subclavian arteries is  identified. Right carotid system: CCA and ICA patent within the neck. Moderate to severe calcified plaque within the right carotid bifurcation and proximal ICA. A hemodynamically significant stenosis at this site cannot be excluded due to significant motion degradation at this level. Left carotid system: CCA and ICA patent within the neck. Moderate calcified plaque within the carotid bifurcation and proximal ICA. Apparent stenosis at the origin of the left ICA of up to 60%. Vertebral arteries: Streak and beam hardening artifact precludes evaluation of the proximal right vertebral artery. Within this limitation, the right vertebral artery appears patent within the neck. However, there is gradually diminishing enhancement within the mid to distal cervical right vertebral artery and the right vertebral artery appears highly stenosed or occluded intracranially. Streak and beam hardening artifact precludes adequate evaluation of the proximal left vertebral artery. Within this limitation, the left vertebral artery is patent within the neck. Skeleton: No acute bony abnormality or aggressive osseous lesion. Cervical spondylosis Other neck: No neck mass or cervical lymphadenopathy. Upper chest: No consolidation within the imaged lung apices. Review of the MIP images confirms the above findings CTA HEAD FINDINGS Significantly limited evaluation due to suboptimal contrast bolus timing. Anterior circulation: The intracranial internal carotid arteries are patent. Prominent calcified plaque within both vessels with suspected moderate stenosis bilaterally. The M1 middle cerebral arteries are patent. No M2 proximal branch occlusion is identified. Moderate/severe stenosis within a mid M2 left MCA branch vessel (series 13, image 27). As before, the A1 left ACA is poorly delineated, this may be secondary to developmentally small vessel size or high-grade stenosis. The anterior cerebral arteries are otherwise patent. No intracranial  aneurysm is identified. Posterior circulation: Paucity of enhancement of the intracranial right vertebral artery. This vessel may be highly stenosed or occluded. The intracranial left vertebral artery is patent. Mild atherosclerotic plaque within this vessel without stenosis. The basilar artery is patent. The posterior cerebral arteries are patent proximally without significant stenosis. Posterior communicating arteries are hypoplastic or absent bilaterally. Venous sinuses: Poorly assessed due to contrast timing. Anatomic variants: As described Review of the MIP images confirms the above findings CT Brain Perfusion Findings: ASPECTS: 10 CBF (<30%) Volume: 0mL Perfusion (Tmax>6.0s) volume: 0mL Mismatch Volume: 0mL Infarction Location:None identified These results were called by telephone at the time of interpretation on 03/18/2020 at 3:07 pm to provider PRAMOD SETHI , who verbally acknowledged these results. IMPRESSION: CTA neck: 1. Significantly limited evaluation due to motion degradation and suboptimal contrast timing. 2. The common and internal carotid arteries are patent within the neck. Moderate/severe calcified plaque within the right carotid bifurcation and proximal ICA. Quantification of stenosis at this site is not possible due to the degree of motion degradation. Calcified plaque within the left carotid bifurcation and proximal ICA with estimated up to 60% stenosis of the proximal left ICA. 3. Streak and beam hardening artifact precludes adequate evaluation of the proximal vertebral arteries. Within this limitation, the vertebral arteries are patent within the neck. However, there  is gradually diminishing enhancement within the mid to distal cervical right vertebral artery and the intracranial right vertebral artery now appears highly stenosed or occluded (however, this could be artifactual in the setting of suboptimal contrast timing). CTA head: 1. Limited evaluation due to suboptimal contrast timing. 2.  Paucity of enhancement within the non dominant intracranial right vertebral artery. This vessel may now be highly stenosed or occluded (however, this could be artifactual in the setting of suboptimal contrast timing). 3. Moderate/severe stenosis within a mid M2 left MCA branch vessel. 4. Elsewhere, no intracranial large vessel occlusion or proximal high-grade arterial stenosis is identified. 5. Prominent calcified plaque within the intracranial ICAs bilaterally with suspected moderate bilateral stenosis. CT perfusion head: The perfusion software identifies no core infarct. The perfusion software identifies no critically hypoperfused parenchyma utilizing the Tmax>6 seconds threshold. Electronically Signed: By: Jackey Loge DO On: 03/18/2020 15:08   CT Code Stroke CTA Neck W/WO contrast  Addendum Date: 03/18/2020   ADDENDUM REPORT: 03/18/2020 15:27 ADDENDUM: These results were called by telephone at the time of interpretation on 03/18/2020 at 3:15 pm to provider PRAMOD SETHI , who verbally acknowledged these results. Electronically Signed   By: Jackey Loge DO   On: 03/18/2020 15:27   Result Date: 03/18/2020 CLINICAL DATA:  Stroke, follow-up. EXAM: CT ANGIOGRAPHY HEAD AND NECK CT PERFUSION BRAIN TECHNIQUE: Multidetector CT imaging of the head and neck was performed using the standard protocol during bolus administration of intravenous contrast. Multiplanar CT image reconstructions and MIPs were obtained to evaluate the vascular anatomy. Carotid stenosis measurements (when applicable) are obtained utilizing NASCET criteria, using the distal internal carotid diameter as the denominator. Multiphase CT imaging of the brain was performed following IV bolus contrast injection. Subsequent parametric perfusion maps were calculated using RAPID software. CONTRAST:  90mL OMNIPAQUE IOHEXOL 350 MG/ML SOLN COMPARISON:  Noncontrast head CT performed earlier today 03/18/2020. MRI/MRA head and MRA neck 11/24/2019. FINDINGS: CTA  NECK FINDINGS Significantly limited evaluation due to poor contrast bolus timing and motion degradation. Aortic arch: Atherosclerotic plaque within the visualized aortic arch and proximal major branch vessels of the neck. Within described limitations, no definite hemodynamically significant stenosis of the innominate or proximal subclavian arteries is identified. Right carotid system: CCA and ICA patent within the neck. Moderate to severe calcified plaque within the right carotid bifurcation and proximal ICA. A hemodynamically significant stenosis at this site cannot be excluded due to significant motion degradation at this level. Left carotid system: CCA and ICA patent within the neck. Moderate calcified plaque within the carotid bifurcation and proximal ICA. Apparent stenosis at the origin of the left ICA of up to 60%. Vertebral arteries: Streak and beam hardening artifact precludes evaluation of the proximal right vertebral artery. Within this limitation, the right vertebral artery appears patent within the neck. However, there is gradually diminishing enhancement within the mid to distal cervical right vertebral artery and the right vertebral artery appears highly stenosed or occluded intracranially. Streak and beam hardening artifact precludes adequate evaluation of the proximal left vertebral artery. Within this limitation, the left vertebral artery is patent within the neck. Skeleton: No acute bony abnormality or aggressive osseous lesion. Cervical spondylosis Other neck: No neck mass or cervical lymphadenopathy. Upper chest: No consolidation within the imaged lung apices. Review of the MIP images confirms the above findings CTA HEAD FINDINGS Significantly limited evaluation due to suboptimal contrast bolus timing. Anterior circulation: The intracranial internal carotid arteries are patent. Prominent calcified plaque within both vessels with suspected  moderate stenosis bilaterally. The M1 middle cerebral  arteries are patent. No M2 proximal branch occlusion is identified. Moderate/severe stenosis within a mid M2 left MCA branch vessel (series 13, image 27). As before, the A1 left ACA is poorly delineated, this may be secondary to developmentally small vessel size or high-grade stenosis. The anterior cerebral arteries are otherwise patent. No intracranial aneurysm is identified. Posterior circulation: Paucity of enhancement of the intracranial right vertebral artery. This vessel may be highly stenosed or occluded. The intracranial left vertebral artery is patent. Mild atherosclerotic plaque within this vessel without stenosis. The basilar artery is patent. The posterior cerebral arteries are patent proximally without significant stenosis. Posterior communicating arteries are hypoplastic or absent bilaterally. Venous sinuses: Poorly assessed due to contrast timing. Anatomic variants: As described Review of the MIP images confirms the above findings CT Brain Perfusion Findings: ASPECTS: 10 CBF (<30%) Volume: 0mL Perfusion (Tmax>6.0s) volume: 0mL Mismatch Volume: 0mL Infarction Location:None identified These results were called by telephone at the time of interpretation on 03/18/2020 at 3:07 pm to provider PRAMOD SETHI , who verbally acknowledged these results. IMPRESSION: CTA neck: 1. Significantly limited evaluation due to motion degradation and suboptimal contrast timing. 2. The common and internal carotid arteries are patent within the neck. Moderate/severe calcified plaque within the right carotid bifurcation and proximal ICA. Quantification of stenosis at this site is not possible due to the degree of motion degradation. Calcified plaque within the left carotid bifurcation and proximal ICA with estimated up to 60% stenosis of the proximal left ICA. 3. Streak and beam hardening artifact precludes adequate evaluation of the proximal vertebral arteries. Within this limitation, the vertebral arteries are patent within  the neck. However, there is gradually diminishing enhancement within the mid to distal cervical right vertebral artery and the intracranial right vertebral artery now appears highly stenosed or occluded (however, this could be artifactual in the setting of suboptimal contrast timing). CTA head: 1. Limited evaluation due to suboptimal contrast timing. 2. Paucity of enhancement within the non dominant intracranial right vertebral artery. This vessel may now be highly stenosed or occluded (however, this could be artifactual in the setting of suboptimal contrast timing). 3. Moderate/severe stenosis within a mid M2 left MCA branch vessel. 4. Elsewhere, no intracranial large vessel occlusion or proximal high-grade arterial stenosis is identified. 5. Prominent calcified plaque within the intracranial ICAs bilaterally with suspected moderate bilateral stenosis. CT perfusion head: The perfusion software identifies no core infarct. The perfusion software identifies no critically hypoperfused parenchyma utilizing the Tmax>6 seconds threshold. Electronically Signed: By: Jackey Loge DO On: 03/18/2020 15:08   CT Code Stroke Cerebral Perfusion with contrast  Addendum Date: 03/18/2020   ADDENDUM REPORT: 03/18/2020 15:27 ADDENDUM: These results were called by telephone at the time of interpretation on 03/18/2020 at 3:15 pm to provider PRAMOD SETHI , who verbally acknowledged these results. Electronically Signed   By: Jackey Loge DO   On: 03/18/2020 15:27   Result Date: 03/18/2020 CLINICAL DATA:  Stroke, follow-up. EXAM: CT ANGIOGRAPHY HEAD AND NECK CT PERFUSION BRAIN TECHNIQUE: Multidetector CT imaging of the head and neck was performed using the standard protocol during bolus administration of intravenous contrast. Multiplanar CT image reconstructions and MIPs were obtained to evaluate the vascular anatomy. Carotid stenosis measurements (when applicable) are obtained utilizing NASCET criteria, using the distal internal  carotid diameter as the denominator. Multiphase CT imaging of the brain was performed following IV bolus contrast injection. Subsequent parametric perfusion maps were calculated using RAPID software. CONTRAST:  72mL OMNIPAQUE IOHEXOL 350 MG/ML SOLN COMPARISON:  Noncontrast head CT performed earlier today 03/18/2020. MRI/MRA head and MRA neck 11/24/2019. FINDINGS: CTA NECK FINDINGS Significantly limited evaluation due to poor contrast bolus timing and motion degradation. Aortic arch: Atherosclerotic plaque within the visualized aortic arch and proximal major branch vessels of the neck. Within described limitations, no definite hemodynamically significant stenosis of the innominate or proximal subclavian arteries is identified. Right carotid system: CCA and ICA patent within the neck. Moderate to severe calcified plaque within the right carotid bifurcation and proximal ICA. A hemodynamically significant stenosis at this site cannot be excluded due to significant motion degradation at this level. Left carotid system: CCA and ICA patent within the neck. Moderate calcified plaque within the carotid bifurcation and proximal ICA. Apparent stenosis at the origin of the left ICA of up to 60%. Vertebral arteries: Streak and beam hardening artifact precludes evaluation of the proximal right vertebral artery. Within this limitation, the right vertebral artery appears patent within the neck. However, there is gradually diminishing enhancement within the mid to distal cervical right vertebral artery and the right vertebral artery appears highly stenosed or occluded intracranially. Streak and beam hardening artifact precludes adequate evaluation of the proximal left vertebral artery. Within this limitation, the left vertebral artery is patent within the neck. Skeleton: No acute bony abnormality or aggressive osseous lesion. Cervical spondylosis Other neck: No neck mass or cervical lymphadenopathy. Upper chest: No consolidation  within the imaged lung apices. Review of the MIP images confirms the above findings CTA HEAD FINDINGS Significantly limited evaluation due to suboptimal contrast bolus timing. Anterior circulation: The intracranial internal carotid arteries are patent. Prominent calcified plaque within both vessels with suspected moderate stenosis bilaterally. The M1 middle cerebral arteries are patent. No M2 proximal branch occlusion is identified. Moderate/severe stenosis within a mid M2 left MCA branch vessel (series 13, image 27). As before, the A1 left ACA is poorly delineated, this may be secondary to developmentally small vessel size or high-grade stenosis. The anterior cerebral arteries are otherwise patent. No intracranial aneurysm is identified. Posterior circulation: Paucity of enhancement of the intracranial right vertebral artery. This vessel may be highly stenosed or occluded. The intracranial left vertebral artery is patent. Mild atherosclerotic plaque within this vessel without stenosis. The basilar artery is patent. The posterior cerebral arteries are patent proximally without significant stenosis. Posterior communicating arteries are hypoplastic or absent bilaterally. Venous sinuses: Poorly assessed due to contrast timing. Anatomic variants: As described Review of the MIP images confirms the above findings CT Brain Perfusion Findings: ASPECTS: 10 CBF (<30%) Volume: 61mL Perfusion (Tmax>6.0s) volume: 57mL Mismatch Volume: 21mL Infarction Location:None identified These results were called by telephone at the time of interpretation on 03/18/2020 at 3:07 pm to provider PRAMOD SETHI , who verbally acknowledged these results. IMPRESSION: CTA neck: 1. Significantly limited evaluation due to motion degradation and suboptimal contrast timing. 2. The common and internal carotid arteries are patent within the neck. Moderate/severe calcified plaque within the right carotid bifurcation and proximal ICA. Quantification of stenosis  at this site is not possible due to the degree of motion degradation. Calcified plaque within the left carotid bifurcation and proximal ICA with estimated up to 60% stenosis of the proximal left ICA. 3. Streak and beam hardening artifact precludes adequate evaluation of the proximal vertebral arteries. Within this limitation, the vertebral arteries are patent within the neck. However, there is gradually diminishing enhancement within the mid to distal cervical right vertebral artery and the intracranial right vertebral  artery now appears highly stenosed or occluded (however, this could be artifactual in the setting of suboptimal contrast timing). CTA head: 1. Limited evaluation due to suboptimal contrast timing. 2. Paucity of enhancement within the non dominant intracranial right vertebral artery. This vessel may now be highly stenosed or occluded (however, this could be artifactual in the setting of suboptimal contrast timing). 3. Moderate/severe stenosis within a mid M2 left MCA branch vessel. 4. Elsewhere, no intracranial large vessel occlusion or proximal high-grade arterial stenosis is identified. 5. Prominent calcified plaque within the intracranial ICAs bilaterally with suspected moderate bilateral stenosis. CT perfusion head: The perfusion software identifies no core infarct. The perfusion software identifies no critically hypoperfused parenchyma utilizing the Tmax>6 seconds threshold. Electronically Signed: By: Jackey Loge DO On: 03/18/2020 15:08   DG Chest Portable 1 View  Result Date: 03/18/2020 CLINICAL DATA:  Unresponsive. EXAM: PORTABLE CHEST 1 VIEW COMPARISON:  01/19/2020 FINDINGS: The cardiac silhouette, mediastinal and hilar contours are within normal limits given the AP projection supine position of the patient. 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 No acute pulmonary findings. The bony thorax is intact.  Bilateral shoulder prostheses are noted. IMPRESSION: No acute  cardiopulmonary findings. Electronically Signed   By: Rudie Meyer M.D.   On: 03/18/2020 14:49   CT HEAD CODE STROKE WO CONTRAST  Result Date: 03/18/2020 CLINICAL DATA:  Code stroke. Neuro deficit, acute, stroke suspected. EXAM: CT HEAD WITHOUT CONTRAST TECHNIQUE: Contiguous axial images were obtained from the base of the skull through the vertex without intravenous contrast. COMPARISON:  MRI/MRA head and MRA neck 11/24/2019. Head CT 11/23/2019. FINDINGS: Brain: Mild-to-moderate cerebral atrophy. 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 Redemonstrated chronic encephalomalacia within the anterior right temporal lobe. Background mild-to-moderate patchy and ill-defined hypoattenuation within the cerebral white matter is nonspecific, but compatible with chronic small vessel ischemic disease. There is no acute intracranial hemorrhage. No acute demarcated cortical infarct. No extra-axial fluid collection. No evidence of intracranial mass. No midline shift. Redemonstrated mega cisterna magna Vascular: No hyperdense vessel.  Atherosclerotic calcifications. Skull: Normal. Negative for fracture or focal lesion. Sinuses/Orbits: Visualized orbits show no acute finding. Trace ethmoid sinus mucosal thickening. ASPECTS ([place] Stroke Program Early CT Score) - Ganglionic level infarction (caudate, lentiform nuclei, internal capsule, insula, M1-M3 cortex): 7 - Supraganglionic infarction (M4-M6 cortex): 3 Total score (0-10 with 10 being normal): 10 These results were called by telephone at the time of interpretation on 03/18/2020 at 2:25 pm to provider [facility] , who verbally acknowledged these results. IMPRESSION: No evidence of acute intracranial abnormality. 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 Redemonstrated chronic encephalomalacia within the anterior right temporal lobe. Stable background mild-to-moderate cerebral atrophy and chronic small vessel  ischemic disease. Electronically Signed   By: Jackey Loge DO   On: 03/18/2020 14:25   MRI and MRAs 11/24/2019 : Small acute to subacute right frontal, left parietal and right occipital infarcts with several chronic infarcts and changes of small vessel disease and chronic microhemorrhages.  Possible amyloid angiopathy. MRAs shows no large vessel stenosis or occlusion  ROS Blood pressure (!) 172/72, pulse (!) 52, temperature 97.8 F (36.6 C), temperature source Oral, resp. rate 19, height 5\' 3"  (1.6 m), weight 78.9 kg, SpO2 100 %. Physical Exam Elderly Caucasian lady is not in distress but appears restless. . Afebrile. Head is nontraumatic. Neck is supple without bruit.    Cardiac exam no murmur or gallop. Lungs are clear to auscultation. Distal pulses are well felt. Neurological Exam:  Patient is stuporous unresponsive.  She responds to sternal rub with partially opening her eyes and mattering in few words but not able to speak sentences.  She moves right upper and both lower extremities purposefully there is movement against gravity in left upper extremity with the previous week.  I then primary physician both pupils are 3 to 4 mm sluggishly reactive.  Doll's eye movements are sluggish.  She does not blink to threat on either side.  Face appears symmetric.  Plantars are both upgoing.  Gait not tested  NIH stroke scale 30.   baseline modified Rankin scale 3  Assessment/Plan: 87 year old Caucasian lady with sudden onset of unresponsiveness and altered mental status with possibly some increased left arm weakness of unclear etiology.  CT angiogram and perfusion did not show significant acute ischemia.  Differential includes unwitnessed seizure with postictal state versus small infarct not visualized on current imaging.  Patient has presented outside the time window for TPA and is not a candidate for mechanical thrombectomy given no LVO found. Recommend admit to the medical service.  Check MRI scan of  the brain and EEG.  Check UA for infection and drug screen.  Long discussion with patient's husband over the phone and with Dr. Stevie Kern about evaluation and plan and answered questions.  Continue close neurological monitoring and wrist condition deteriorates and is unable to protect airway may need intubation and admission to ICU.  Stroke team will follow.  Kindly call for questions. This patient is critically ill and at significant risk of neurological worsening, death and care requires constant monitoring of vital signs, hemodynamics,respiratory and cardiac monitoring, extensive review of multiple databases, frequent neurological assessment, discussion with family, other specialists and medical decision making of high complexity.I have made any additions or clarifications directly to the above note.This critical care time does not reflect procedure time, or teaching time or supervisory time of PA/NP/Med Resident etc but could involve care discussion time.  I spent 50 minutes of neurocritical care time  in the care of  this patient.     Delia Heady 03/18/2020, 4:14 PM    Note: This document was prepared with digital dictation and possible smart phrase technology. Any transcriptional errors that result from this process are unintentional.

## 2020-03-18 NOTE — ED Provider Notes (Signed)
[facility] EMERGENCY DEPARTMENT Provider Note   CSN: [id number] Arrival date & time: 03/18/20  1404  An emergency department physician performed an initial assessment on this suspected stroke patient at 1408.  History Chief Complaint  Patient presents with  . Code Stroke    Hannah Fischer is a 87 y.o. female.  History limited due to altered mental status, acuity.  Comes from home as stroke alert.  Last known well at midnight when she went to bed.  When husband woke up this morning felt patient was just sleeping however he was unable to arouse her.  Has history of prior stroke with mild left-sided deficits.  HPI     Past Medical History:  Diagnosis Date  . Abnormal nuclear stress test    2005, normal cath Dr. Elease Hashimoto, normal stress test 2012 Dr. Eldridge Dace  . Anemia    chronic  . Anxiety   . Arthritis    djd  . Arthritis of shoulder region, left, degenerative 12/03/2013  . Blood transfusion 1979   for anemia  . Chest pain    evaluated @ [facility] cardiology; had a stress test 2012  . Chronic pain syndrome 04/29/2019  . Chronic right-sided low back pain without sciatica 04/12/2019  . Complication of anesthesia    unable to breathe lying  flat on back  . Diabetes mellitus    type 2  niddm x 25 yrs  . Diabetes mellitus (HCC) 11/15/2011  . Diverticular disease   . DJD (degenerative joint disease)    lumbar and cervical  . GERD (gastroesophageal reflux disease)   . Hyperlipemia   . Hypertension    on medication  . Hypothyroidism    takes levoxyl  . Irritable bowel syndrome   . 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 Neuromuscular disorder (HCC)   . Neuropathy in diabetes (HCC)   . Nonspecific abnormal electrocardiogram (ECG) (EKG) 10/15/2013  . Other and unspecified angina pectoris    tests came back NOT heart related  . Pulmonary embolism (HCC) 06-2010   bilateral  . Pyuria 11/15/2011  . Seizures (HCC)    once, age 41  . TIA (transient  ischemic attack) 11/15/2011    Patient Active Problem List   Diagnosis Date Noted  . Acute encephalopathy 03/18/2020  . Gait abnormality 01/19/2020  . Chest pressure 01/19/2020  . Cerebral amyloid angiopathy (CODE)   . Stroke (HCC) 11/24/2019  . AKI (acute kidney injury) (HCC) 11/24/2019  . Spinal stenosis of lumbar region with neurogenic claudication 10/26/2019  . Abnormality of gait 08/02/2019  . Cerebrovascular accident (CVA) (HCC) 07/22/2019  . Dizziness 06/15/2019  . Brain aneurysm 06/15/2019  . Chronic pain syndrome 04/29/2019  . Chronic right-sided low back pain without sciatica 04/12/2019  . Arthritis of shoulder region, left, degenerative 12/03/2013  . Nonspecific abnormal electrocardiogram (ECG) (EKG) 10/15/2013  . Other and unspecified angina pectoris 10/15/2013  . TIA (transient ischemic attack) 11/15/2011  . HTN (hypertension) 11/15/2011  . Diabetes mellitus (HCC) 11/15/2011  . Hypothyroidism 11/15/2011  . Dyslipidemia 11/15/2011  . GERD (gastroesophageal reflux disease) 11/15/2011  . Pyuria 11/15/2011    Past Surgical History:  Procedure Laterality Date  . BACK SURGERY    . BLEPHAROPLASTY  2012   rt eye  . CARDIAC CATHETERIZATION     had one 10/2013  . CHOLECYSTECTOMY    . EYE SURGERY  2007   cataract ext/ iol rt eye  . JOINT REPLACEMENT  [id number]  both knees  . LEFT HEART CATHETERIZATION WITH CORONARY ANGIOGRAM N/A 10/18/2013   Procedure: LEFT HEART CATHETERIZATION WITH CORONARY ANGIOGRAM;  Surgeon: Corky Crafts, MD;  Location: [facility] CATH [facility];  Service: Cardiovascular;  Laterality: N/A;  . LUMBAR DISC SURGERY  1990  . REVERSE SHOULDER ARTHROPLASTY Left 12/03/2013   Procedure: LEFT SHOULDER REVERSE ARTHROPLASTY;  Surgeon: Verlee Rossetti, MD;  Location: [facility] OR;  Service: Orthopedics;  Laterality: Left;  . SHOULDER HEMI-ARTHROPLASTY  12/25/2010   Procedure: SHOULDER HEMI-ARTHROPLASTY;  Surgeon: Cammy Copa;  Location: [facility] OR;  Service:  Orthopedics;  Laterality: Right;     OB History   No obstetric history on file.     Family History  Problem Relation Age of Onset  . Diabetes Mother   . Heart disease Mother   . Heart disease Sister   . Heart disease Brother     Social History   Tobacco Use  . Smoking status: Never Smoker  . Smokeless tobacco: Never Used  Substance Use Topics  . Alcohol use: No  . Drug use: No    Home Medications Prior to Admission medications   Medication Sig Start Date End Date Taking? Authorizing Provider  amLODipine (NORVASC) 5 MG tablet Take 5 mg by mouth every morning. 12/22/19  Yes [provider]  B Complex Vitamins (VITAMIN B COMPLEX) TABS Take 1 tablet by mouth daily after breakfast.   Yes [provider]  Cholecalciferol (VITAMIN D3) 2000 UNITS TABS Take 2,000 Units by mouth daily.    Yes [provider]  clopidogrel (PLAVIX) 75 MG tablet Take 1 tablet (75 mg total) by mouth daily with breakfast. 11/17/11  Yes Vassie Loll, MD  DULoxetine (CYMBALTA) 30 MG capsule TAKE 1 CAPSULE(30 MG) BY MOUTH DAILY Patient taking differently: Take 30 mg by mouth daily. 08/25/19  Yes Marcello Fennel, MD  levothyroxine (SYNTHROID) 112 MCG tablet Take 112 mcg by mouth daily before breakfast.   Yes [provider]  losartan (COZAAR) 100 MG tablet Take 100 mg by mouth daily. 04/29/19  Yes [provider]  nitrofurantoin (MACRODANTIN) 100 MG capsule Take 100 mg by mouth daily. 03/09/20  Yes [provider]  Omega-3 Fatty Acids (FISH OIL) 1000 MG CAPS Take 1,000 mg by mouth 2 (two) times daily.   Yes [provider]  pioglitazone (ACTOS) 45 MG tablet Take 45 mg by mouth daily.   Yes [provider]  propranolol (INDERAL) 40 MG tablet Take 40 mg by mouth daily.  04/13/17  Yes [provider]  traZODone (DESYREL) 50 MG tablet TAKE 1 TABLET BY MOUTH AT BEDTIME Patient taking differently: Take 50 mg by mouth at bedtime. 07/01/17   Yes Patel, Maryln Gottron, MD  levothyroxine (SYNTHROID, LEVOTHROID) 100 MCG tablet Take 1 tablet (100 mcg total) by mouth daily. Patient not taking: No sig reported 11/17/11   Vassie Loll, MD  methocarbamol (ROBAXIN) 500 MG tablet Take 1 tablet (500 mg total) by mouth every 8 (eight) hours as needed for muscle spasms. Patient not taking: No sig reported 04/13/19   Marcello Fennel, MD  rosuvastatin (CRESTOR) 5 MG tablet Take 1 tablet (5 mg total) by mouth 2 (two) times a week for 30 doses. 11/29/19 03/10/20  Azucena Fallen, MD    Allergies    Atorvastatin, Ezetimibe-simvastatin, Lovastatin, Metformin hcl, Pravastatin, Codeine, Simvastatin, Sulfa antibiotics, and Sulfites  Review of Systems   Review of Systems  Unable to perform ROS: Mental status change    Physical Exam  Updated Vital Signs BP (!) 191/93   Pulse (!) 51   Temp 97.8 F (36.6 C) (Oral)   Resp 18   Ht 5\' 3"  (1.6 m)   Wt 78.9 kg   SpO2 100%   BMI 30.81 kg/m   Physical Exam Vitals and nursing note reviewed.  Constitutional:      Comments: unresponsive  HENT:     Head: Normocephalic.     Comments: Gag reflex intact    Nose: Nose normal.     Mouth/Throat:     Mouth: Mucous membranes are moist.  Eyes:     Comments: Left pupil 4mm, right pupil is 2mm  Cardiovascular:     Rate and Rhythm: Normal rate.     Pulses: Normal pulses.  Musculoskeletal:        General: No deformity or signs of injury.     Cervical back: Normal range of motion.  Skin:    General: Skin is warm and dry.  Neurological:     Comments: Localizes pain equally in all four extremities, incomprehensible sound, does not open eyes     ED Results / Procedures / Treatments   Labs (all labs ordered are listed, but only abnormal results are displayed) Labs Reviewed  CBC - Abnormal; Notable for the following components:      Result Value   WBC 3.8 (*)    RBC 3.70 (*)    Hemoglobin 11.8 (*)    MCV 101.9 (*)    All other components within  normal limits  COMPREHENSIVE METABOLIC PANEL - Abnormal; Notable for the following components:   Glucose, Bld 176 (*)    BUN 24 (*)    Creatinine, Ser 1.10 (*)    Calcium 8.8 (*)    Total Protein 5.5 (*)    Albumin 2.9 (*)    GFR, Estimated 49 (*)    All other components within normal limits  URINALYSIS, ROUTINE W REFLEX MICROSCOPIC - Abnormal; Notable for the following components:   Hgb urine dipstick TRACE (*)    Leukocytes,Ua MODERATE (*)    All other components within normal limits  URINALYSIS, MICROSCOPIC (REFLEX) - Abnormal; Notable for the following components:   Non Squamous Epithelial PRESENT (*)    All other components within normal limits  I-STAT CHEM 8, ED - Abnormal; Notable for the following components:   BUN 26 (*)    Glucose, Bld 172 (*)    Calcium, Ion 1.14 (*)    All other components within normal limits  CBG MONITORING, ED - Abnormal; Notable for the following components:   Glucose-Capillary 156 (*)    All other components within normal limits  I-STAT ARTERIAL BLOOD GAS, ED - Abnormal; Notable for the following components:   pH, Arterial 7.333 (*)    pCO2 arterial 61.4 (*)    Bicarbonate 32.6 (*)    TCO2 34 (*)    Acid-Base Excess 5.0 (*)    All other components within normal limits  RESP PANEL BY RT-PCR (FLU A&B, COVID) ARPGX2  CULTURE, BLOOD (ROUTINE X 2)  CULTURE, BLOOD (ROUTINE X 2)  PROTIME-INR  APTT  DIFFERENTIAL  LACTIC ACID, PLASMA  AMMONIA  RAPID URINE DRUG SCREEN, HOSP PERFORMED  BLOOD GAS, VENOUS  URINALYSIS, ROUTINE W REFLEX MICROSCOPIC  BLOOD GAS, ARTERIAL  TSH  RAPID URINE DRUG SCREEN, HOSP PERFORMED  BLOOD GAS, ARTERIAL  MAGNESIUM  PHOSPHORUS  TROPONIN I (HIGH SENSITIVITY)  TROPONIN I (HIGH SENSITIVITY)    EKG EKG Interpretation  Date/Time:  Saturday March 18 2020 14:36:37 EST Ventricular Rate:  54 PR Interval:    QRS Duration: 109 QT Interval:  543 QTC Calculation: 515 R Axis:   -20 Text Interpretation: Sinus rhythm  Borderline left axis deviation Anteroseptal infarct, age indeterminate Prolonged QT interval Confirmed by Marianna Fuss ([id number]) on 03/18/2020 2:41:27 PM   Radiology CT Code Stroke CTA Head W/WO contrast  Addendum Date: 03/18/2020   ADDENDUM REPORT: 03/18/2020 15:27 ADDENDUM: These results were called by telephone at the time of interpretation on 03/18/2020 at 3:15 pm to provider PRAMOD SETHI , who verbally acknowledged these results. Electronically Signed   By: Jackey Loge DO   On: 03/18/2020 15:27   Result Date: 03/18/2020 CLINICAL DATA:  Stroke, follow-up. EXAM: CT ANGIOGRAPHY HEAD AND NECK CT PERFUSION BRAIN TECHNIQUE: Multidetector CT imaging of the head and neck was performed using the standard protocol during bolus administration of intravenous contrast. Multiplanar CT image reconstructions and MIPs were obtained to evaluate the vascular anatomy. Carotid stenosis measurements (when applicable) are obtained utilizing NASCET criteria, using the distal internal carotid diameter as the denominator. Multiphase CT imaging of the brain was performed following IV bolus contrast injection. Subsequent parametric perfusion maps were calculated using RAPID software. CONTRAST:  42mL OMNIPAQUE IOHEXOL 350 MG/ML SOLN COMPARISON:  Noncontrast head CT performed earlier today 03/18/2020. MRI/MRA head and MRA neck 11/24/2019. FINDINGS: CTA NECK FINDINGS Significantly limited evaluation due to poor contrast bolus timing and motion degradation. Aortic arch: Atherosclerotic plaque within the visualized aortic arch and proximal major branch vessels of the neck. Within described limitations, no definite hemodynamically significant stenosis of the innominate or proximal subclavian arteries is identified. Right carotid system: CCA and ICA patent within the neck. Moderate to severe calcified plaque within the right carotid bifurcation and proximal ICA. A hemodynamically significant stenosis at this site cannot be excluded due  to significant motion degradation at this level. Left carotid system: CCA and ICA patent within the neck. Moderate calcified plaque within the carotid bifurcation and proximal ICA. Apparent stenosis at the origin of the left ICA of up to 60%. Vertebral arteries: Streak and beam hardening artifact precludes evaluation of the proximal right vertebral artery. Within this limitation, the right vertebral artery appears patent within the neck. However, there is gradually diminishing enhancement within the mid to distal cervical right vertebral artery and the right vertebral artery appears highly stenosed or occluded intracranially. Streak and beam hardening artifact precludes adequate evaluation of the proximal left vertebral artery. Within this limitation, the left vertebral artery is patent within the neck. Skeleton: No acute bony abnormality or aggressive osseous lesion. Cervical spondylosis Other neck: No neck mass or cervical lymphadenopathy. Upper chest: No consolidation within the imaged lung apices. Review of the MIP images confirms the above findings CTA HEAD FINDINGS Significantly limited evaluation due to suboptimal contrast bolus timing. Anterior circulation: The intracranial internal carotid arteries are patent. Prominent calcified plaque within both vessels with suspected moderate stenosis bilaterally. The M1 middle cerebral arteries are patent. No M2 proximal branch occlusion is identified. Moderate/severe stenosis within a mid M2 left MCA branch vessel (series 13, image 27). As before, the A1 left ACA is poorly delineated, this may be secondary to developmentally small vessel size or high-grade stenosis. The anterior cerebral arteries are otherwise patent. No intracranial aneurysm is identified. Posterior circulation: Paucity of enhancement of the intracranial right vertebral artery. This vessel may be highly stenosed or occluded. The intracranial left vertebral artery is patent. Mild atherosclerotic  plaque within this vessel without stenosis.  The basilar artery is patent. The posterior cerebral arteries are patent proximally without significant stenosis. Posterior communicating arteries are hypoplastic or absent bilaterally. Venous sinuses: Poorly assessed due to contrast timing. Anatomic variants: As described Review of the MIP images confirms the above findings CT Brain Perfusion Findings: ASPECTS: 10 CBF (<30%) Volume: 0mL Perfusion (Tmax>6.0s) volume: 0mL Mismatch Volume: 0mL Infarction Location:None identified These results were called by telephone at the time of interpretation on 03/18/2020 at 3:07 pm to provider PRAMOD SETHI , who verbally acknowledged these results. IMPRESSION: CTA neck: 1. Significantly limited evaluation due to motion degradation and suboptimal contrast timing. 2. The common and internal carotid arteries are patent within the neck. Moderate/severe calcified plaque within the right carotid bifurcation and proximal ICA. Quantification of stenosis at this site is not possible due to the degree of motion degradation. Calcified plaque within the left carotid bifurcation and proximal ICA with estimated up to 60% stenosis of the proximal left ICA. 3. Streak and beam hardening artifact precludes adequate evaluation of the proximal vertebral arteries. Within this limitation, the vertebral arteries are patent within the neck. However, there is gradually diminishing enhancement within the mid to distal cervical right vertebral artery and the intracranial right vertebral artery now appears highly stenosed or occluded (however, this could be artifactual in the setting of suboptimal contrast timing). CTA head: 1. Limited evaluation due to suboptimal contrast timing. 2. Paucity of enhancement within the non dominant intracranial right vertebral artery. This vessel may now be highly stenosed or occluded (however, this could be artifactual in the setting of suboptimal contrast timing). 3.  Moderate/severe stenosis within a mid M2 left MCA branch vessel. 4. Elsewhere, no intracranial large vessel occlusion or proximal high-grade arterial stenosis is identified. 5. Prominent calcified plaque within the intracranial ICAs bilaterally with suspected moderate bilateral stenosis. CT perfusion head: The perfusion software identifies no core infarct. The perfusion software identifies no critically hypoperfused parenchyma utilizing the Tmax>6 seconds threshold. Electronically Signed: By: Jackey Loge DO On: 03/18/2020 15:08   CT Code Stroke CTA Neck W/WO contrast  Addendum Date: 03/18/2020   ADDENDUM REPORT: 03/18/2020 15:27 ADDENDUM: These results were called by telephone at the time of interpretation on 03/18/2020 at 3:15 pm to provider PRAMOD SETHI , who verbally acknowledged these results. Electronically Signed   By: Jackey Loge DO   On: 03/18/2020 15:27   Result Date: 03/18/2020 CLINICAL DATA:  Stroke, follow-up. EXAM: CT ANGIOGRAPHY HEAD AND NECK CT PERFUSION BRAIN TECHNIQUE: Multidetector CT imaging of the head and neck was performed using the standard protocol during bolus administration of intravenous contrast. Multiplanar CT image reconstructions and MIPs were obtained to evaluate the vascular anatomy. Carotid stenosis measurements (when applicable) are obtained utilizing NASCET criteria, using the distal internal carotid diameter as the denominator. Multiphase CT imaging of the brain was performed following IV bolus contrast injection. Subsequent parametric perfusion maps were calculated using RAPID software. CONTRAST:  90mL OMNIPAQUE IOHEXOL 350 MG/ML SOLN COMPARISON:  Noncontrast head CT performed earlier today 03/18/2020. MRI/MRA head and MRA neck 11/24/2019. FINDINGS: CTA NECK FINDINGS Significantly limited evaluation due to poor contrast bolus timing and motion degradation. Aortic arch: Atherosclerotic plaque within the visualized aortic arch and proximal major branch vessels of the neck.  Within described limitations, no definite hemodynamically significant stenosis of the innominate or proximal subclavian arteries is identified. Right carotid system: CCA and ICA patent within the neck. Moderate to severe calcified plaque within the right carotid bifurcation and proximal ICA. A hemodynamically significant stenosis  at this site cannot be excluded due to significant motion degradation at this level. Left carotid system: CCA and ICA patent within the neck. Moderate calcified plaque within the carotid bifurcation and proximal ICA. Apparent stenosis at the origin of the left ICA of up to 60%. Vertebral arteries: Streak and beam hardening artifact precludes evaluation of the proximal right vertebral artery. Within this limitation, the right vertebral artery appears patent within the neck. However, there is gradually diminishing enhancement within the mid to distal cervical right vertebral artery and the right vertebral artery appears highly stenosed or occluded intracranially. Streak and beam hardening artifact precludes adequate evaluation of the proximal left vertebral artery. Within this limitation, the left vertebral artery is patent within the neck. Skeleton: No acute bony abnormality or aggressive osseous lesion. Cervical spondylosis Other neck: No neck mass or cervical lymphadenopathy. Upper chest: No consolidation within the imaged lung apices. Review of the MIP images confirms the above findings CTA HEAD FINDINGS Significantly limited evaluation due to suboptimal contrast bolus timing. Anterior circulation: The intracranial internal carotid arteries are patent. Prominent calcified plaque within both vessels with suspected moderate stenosis bilaterally. The M1 middle cerebral arteries are patent. No M2 proximal branch occlusion is identified. Moderate/severe stenosis within a mid M2 left MCA branch vessel (series 13, image 27). As before, the A1 left ACA is poorly delineated, this may be secondary  to developmentally small vessel size or high-grade stenosis. The anterior cerebral arteries are otherwise patent. No intracranial aneurysm is identified. Posterior circulation: Paucity of enhancement of the intracranial right vertebral artery. This vessel may be highly stenosed or occluded. The intracranial left vertebral artery is patent. Mild atherosclerotic plaque within this vessel without stenosis. The basilar artery is patent. The posterior cerebral arteries are patent proximally without significant stenosis. Posterior communicating arteries are hypoplastic or absent bilaterally. Venous sinuses: Poorly assessed due to contrast timing. Anatomic variants: As described Review of the MIP images confirms the above findings CT Brain Perfusion Findings: ASPECTS: 10 CBF (<30%) Volume: 37mL Perfusion (Tmax>6.0s) volume: 78mL Mismatch Volume: 76mL Infarction Location:None identified These results were called by telephone at the time of interpretation on 03/18/2020 at 3:07 pm to provider PRAMOD SETHI , who verbally acknowledged these results. IMPRESSION: CTA neck: 1. Significantly limited evaluation due to motion degradation and suboptimal contrast timing. 2. The common and internal carotid arteries are patent within the neck. Moderate/severe calcified plaque within the right carotid bifurcation and proximal ICA. Quantification of stenosis at this site is not possible due to the degree of motion degradation. Calcified plaque within the left carotid bifurcation and proximal ICA with estimated up to 60% stenosis of the proximal left ICA. 3. Streak and beam hardening artifact precludes adequate evaluation of the proximal vertebral arteries. Within this limitation, the vertebral arteries are patent within the neck. However, there is gradually diminishing enhancement within the mid to distal cervical right vertebral artery and the intracranial right vertebral artery now appears highly stenosed or occluded (however, this could be  artifactual in the setting of suboptimal contrast timing). CTA head: 1. Limited evaluation due to suboptimal contrast timing. 2. Paucity of enhancement within the non dominant intracranial right vertebral artery. This vessel may now be highly stenosed or occluded (however, this could be artifactual in the setting of suboptimal contrast timing). 3. Moderate/severe stenosis within a mid M2 left MCA branch vessel. 4. Elsewhere, no intracranial large vessel occlusion or proximal high-grade arterial stenosis is identified. 5. Prominent calcified plaque within the intracranial ICAs bilaterally with suspected  moderate bilateral stenosis. CT perfusion head: The perfusion software identifies no core infarct. The perfusion software identifies no critically hypoperfused parenchyma utilizing the Tmax>6 seconds threshold. Electronically Signed: By: Jackey LogeKyle  Golden DO On: 03/18/2020 15:08   CT Code Stroke Cerebral Perfusion with contrast  Addendum Date: 03/18/2020   ADDENDUM REPORT: 03/18/2020 15:27 ADDENDUM: These results were called by telephone at the time of interpretation on 03/18/2020 at 3:15 pm to provider PRAMOD SETHI , who verbally acknowledged these results. Electronically Signed   By: Jackey LogeKyle  Golden DO   On: 03/18/2020 15:27   Result Date: 03/18/2020 CLINICAL DATA:  Stroke, follow-up. EXAM: CT ANGIOGRAPHY HEAD AND NECK CT PERFUSION BRAIN TECHNIQUE: Multidetector CT imaging of the head and neck was performed using the standard protocol during bolus administration of intravenous contrast. Multiplanar CT image reconstructions and MIPs were obtained to evaluate the vascular anatomy. Carotid stenosis measurements (when applicable) are obtained utilizing NASCET criteria, using the distal internal carotid diameter as the denominator. Multiphase CT imaging of the brain was performed following IV bolus contrast injection. Subsequent parametric perfusion maps were calculated using RAPID software. CONTRAST:  90mL OMNIPAQUE  IOHEXOL 350 MG/ML SOLN COMPARISON:  Noncontrast head CT performed earlier today 03/18/2020. MRI/MRA head and MRA neck 11/24/2019. FINDINGS: CTA NECK FINDINGS Significantly limited evaluation due to poor contrast bolus timing and motion degradation. Aortic arch: Atherosclerotic plaque within the visualized aortic arch and proximal major branch vessels of the neck. Within described limitations, no definite hemodynamically significant stenosis of the innominate or proximal subclavian arteries is identified. Right carotid system: CCA and ICA patent within the neck. Moderate to severe calcified plaque within the right carotid bifurcation and proximal ICA. A hemodynamically significant stenosis at this site cannot be excluded due to significant motion degradation at this level. Left carotid system: CCA and ICA patent within the neck. Moderate calcified plaque within the carotid bifurcation and proximal ICA. Apparent stenosis at the origin of the left ICA of up to 60%. Vertebral arteries: Streak and beam hardening artifact precludes evaluation of the proximal right vertebral artery. Within this limitation, the right vertebral artery appears patent within the neck. However, there is gradually diminishing enhancement within the mid to distal cervical right vertebral artery and the right vertebral artery appears highly stenosed or occluded intracranially. Streak and beam hardening artifact precludes adequate evaluation of the proximal left vertebral artery. Within this limitation, the left vertebral artery is patent within the neck. Skeleton: No acute bony abnormality or aggressive osseous lesion. Cervical spondylosis Other neck: No neck mass or cervical lymphadenopathy. Upper chest: No consolidation within the imaged lung apices. Review of the MIP images confirms the above findings CTA HEAD FINDINGS Significantly limited evaluation due to suboptimal contrast bolus timing. Anterior circulation: The intracranial internal  carotid arteries are patent. Prominent calcified plaque within both vessels with suspected moderate stenosis bilaterally. The M1 middle cerebral arteries are patent. No M2 proximal branch occlusion is identified. Moderate/severe stenosis within a mid M2 left MCA branch vessel (series 13, image 27). As before, the A1 left ACA is poorly delineated, this may be secondary to developmentally small vessel size or high-grade stenosis. The anterior cerebral arteries are otherwise patent. No intracranial aneurysm is identified. Posterior circulation: Paucity of enhancement of the intracranial right vertebral artery. This vessel may be highly stenosed or occluded. The intracranial left vertebral artery is patent. Mild atherosclerotic plaque within this vessel without stenosis. The basilar artery is patent. The posterior cerebral arteries are patent proximally without significant stenosis. Posterior communicating arteries  are hypoplastic or absent bilaterally. Venous sinuses: Poorly assessed due to contrast timing. Anatomic variants: As described Review of the MIP images confirms the above findings CT Brain Perfusion Findings: ASPECTS: 10 CBF (<30%) Volume: 0mL Perfusion (Tmax>6.0s) volume: 0mL Mismatch Volume: 0mL Infarction Location:None identified These results were called by telephone at the time of interpretation on 03/18/2020 at 3:07 pm to provider PRAMOD SETHI , who verbally acknowledged these results. IMPRESSION: CTA neck: 1. Significantly limited evaluation due to motion degradation and suboptimal contrast timing. 2. The common and internal carotid arteries are patent within the neck. Moderate/severe calcified plaque within the right carotid bifurcation and proximal ICA. Quantification of stenosis at this site is not possible due to the degree of motion degradation. Calcified plaque within the left carotid bifurcation and proximal ICA with estimated up to 60% stenosis of the proximal left ICA. 3. Streak and beam  hardening artifact precludes adequate evaluation of the proximal vertebral arteries. Within this limitation, the vertebral arteries are patent within the neck. However, there is gradually diminishing enhancement within the mid to distal cervical right vertebral artery and the intracranial right vertebral artery now appears highly stenosed or occluded (however, this could be artifactual in the setting of suboptimal contrast timing). CTA head: 1. Limited evaluation due to suboptimal contrast timing. 2. Paucity of enhancement within the non dominant intracranial right vertebral artery. This vessel may now be highly stenosed or occluded (however, this could be artifactual in the setting of suboptimal contrast timing). 3. Moderate/severe stenosis within a mid M2 left MCA branch vessel. 4. Elsewhere, no intracranial large vessel occlusion or proximal high-grade arterial stenosis is identified. 5. Prominent calcified plaque within the intracranial ICAs bilaterally with suspected moderate bilateral stenosis. CT perfusion head: The perfusion software identifies no core infarct. The perfusion software identifies no critically hypoperfused parenchyma utilizing the Tmax>6 seconds threshold. Electronically Signed: By: Jackey Loge DO On: 03/18/2020 15:08   DG Chest Portable 1 View  Result Date: 03/18/2020 CLINICAL DATA:  Unresponsive. EXAM: PORTABLE CHEST 1 VIEW COMPARISON:  01/19/2020 FINDINGS: The cardiac silhouette, mediastinal and hilar contours are within normal limits given the AP projection supine position of the patient. 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 No acute pulmonary findings. The bony thorax is intact.  Bilateral shoulder prostheses are noted. IMPRESSION: No acute cardiopulmonary findings. Electronically Signed   By: Rudie Meyer M.D.   On: 03/18/2020 14:49   CT HEAD CODE STROKE WO CONTRAST  Result Date: 03/18/2020 CLINICAL DATA:  Code stroke. Neuro deficit, acute, stroke suspected.  EXAM: CT HEAD WITHOUT CONTRAST TECHNIQUE: Contiguous axial images were obtained from the base of the skull through the vertex without intravenous contrast. COMPARISON:  MRI/MRA head and MRA neck 11/24/2019. Head CT 11/23/2019. FINDINGS: Brain: Mild-to-moderate cerebral atrophy. 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 Redemonstrated chronic encephalomalacia within the anterior right temporal lobe. Background mild-to-moderate patchy and ill-defined hypoattenuation within the cerebral white matter is nonspecific, but compatible with chronic small vessel ischemic disease. There is no acute intracranial hemorrhage. No acute demarcated cortical infarct. No extra-axial fluid collection. No evidence of intracranial mass. No midline shift. Redemonstrated mega cisterna magna Vascular: No hyperdense vessel.  Atherosclerotic calcifications. Skull: Normal. Negative for fracture or focal lesion. Sinuses/Orbits: Visualized orbits show no acute finding. Trace ethmoid sinus mucosal thickening. ASPECTS [facility] Stroke Program Early CT Score) - Ganglionic level infarction (caudate, lentiform nuclei, internal capsule, insula, M1-M3 cortex): 7 - Supraganglionic infarction (M4-M6 cortex): 3 Total score (0-10 with 10 being normal): 10 These  results were called by telephone at the time of interpretation on 03/18/2020 at 2:25 pm to provider [facility] , who verbally acknowledged these results. IMPRESSION: No evidence of acute intracranial abnormality. 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 Redemonstrated chronic encephalomalacia within the anterior right temporal lobe. Stable background mild-to-moderate cerebral atrophy and chronic small vessel ischemic disease. Electronically Signed   By: Jackey Loge DO   On: 03/18/2020 14:25    Procedures .Critical Care Performed by: Milagros Loll, MD Authorized by: Milagros Loll, MD   Critical care provider  statement:    Critical care time (minutes):  49   Critical care was necessary to treat or prevent imminent or life-threatening deterioration of the following conditions:  CNS failure or compromise   Critical care was time spent personally by me on the following activities:  Discussions with consultants, evaluation of patient's response to treatment, examination of patient, ordering and performing treatments and interventions, ordering and review of laboratory studies, ordering and review of radiographic studies, pulse oximetry, re-evaluation of patient's condition, obtaining history from patient or surrogate and review of old charts     Medications Ordered in ED Medications  sodium chloride flush (NS) 0.9 % injection 3 mL (3 mLs Intravenous Not Given 03/18/20 1439)  labetalol (NORMODYNE) injection 20 mg (has no administration in time range)  docusate sodium (COLACE) capsule 100 mg (has no administration in time range)  polyethylene glycol (MIRALAX / GLYCOLAX) packet 17 g (has no administration in time range)  heparin injection 5,000 Units (has no administration in time range)  iohexol (OMNIPAQUE) 350 MG/ML injection 90 mL (90 mLs Intravenous Contrast Given 03/18/20 1429)    ED Course  I have reviewed the triage vital signs and the nursing notes.  Pertinent labs & imaging results that were available during my care of the patient were reviewed by me and considered in my medical decision making (see chart for details).  Clinical Course as of 03/18/20 1734  Sat Mar 18, 2020  1649 D/w critical care - they will come evaluate patient [RD]    Clinical Course User Index [RD] Milagros Loll, MD   MDM Rules/Calculators/A&P                          87 y/o lady presents as a stroke alert after unresponsiveness, altered mental status.  On arrival to ER, patient was lethargic, decreased GCS but had intact gag reflex and appeared to be protecting airway.  Taken to CT scanner emergently with neuro.   CT without convincing evidence for acute ischemic stroke.  Neurology recommending EEG and MRI to further evaluate.  Commence broad work-up, her basic labs were grossly stable.  No clear etiology for her current mental status at this time.  We will consult the ICU team for close monitoring.  Extensive goals of care conversation with husband who states patient would be open to resuscitation if there is felt to be a reversible cause but if her condition was felt not to be reversible then would not want intubation or CPR.  As patient appears to be protecting airway and does not have impending respiratory failure, deferred intubation for now.  ICU team will accept for further work-up and management.    Final Clinical Impression(s) / ED Diagnoses Final diagnoses:  Altered mental status, unspecified altered mental status type  Stroke-like symptoms  Unresponsive    Rx / DC Orders ED Discharge Orders    None  Milagros Loll, MD 03/18/20 [phone]

## 2020-03-19 ENCOUNTER — Inpatient Hospital Stay (HOSPITAL_COMMUNITY): Payer: Medicare Other

## 2020-03-19 DIAGNOSIS — N179 Acute kidney failure, unspecified: Secondary | ICD-10-CM | POA: Diagnosis not present

## 2020-03-19 DIAGNOSIS — N1832 Chronic kidney disease, stage 3b: Secondary | ICD-10-CM

## 2020-03-19 DIAGNOSIS — R739 Hyperglycemia, unspecified: Secondary | ICD-10-CM | POA: Diagnosis not present

## 2020-03-19 DIAGNOSIS — G9340 Encephalopathy, unspecified: Secondary | ICD-10-CM | POA: Diagnosis not present

## 2020-03-19 DIAGNOSIS — I639 Cerebral infarction, unspecified: Secondary | ICD-10-CM | POA: Diagnosis not present

## 2020-03-19 LAB — CBC
HCT: 39.8 % (ref 36.0–46.0)
Hemoglobin: 12.8 g/dL (ref 12.0–15.0)
MCH: 31.8 pg (ref 26.0–34.0)
MCHC: 32.2 g/dL (ref 30.0–36.0)
MCV: 98.8 fL (ref 80.0–100.0)
Platelets: 213 10*3/uL (ref 150–400)
RBC: 4.03 MIL/uL (ref 3.87–5.11)
RDW: 13.3 % (ref 11.5–15.5)
WBC: 7.2 10*3/uL (ref 4.0–10.5)
nRBC: 0 % (ref 0.0–0.2)

## 2020-03-19 LAB — GLUCOSE, CAPILLARY
Glucose-Capillary: 176 mg/dL — ABNORMAL HIGH (ref 70–99)
Glucose-Capillary: 167 mg/dL — ABNORMAL HIGH (ref 70–99)
Glucose-Capillary: 113 mg/dL — ABNORMAL HIGH (ref 70–99)
Glucose-Capillary: 122 mg/dL — ABNORMAL HIGH (ref 70–99)
Glucose-Capillary: 124 mg/dL — ABNORMAL HIGH (ref 70–99)
Glucose-Capillary: 111 mg/dL — ABNORMAL HIGH (ref 70–99)

## 2020-03-19 LAB — LIPID PANEL
Cholesterol: 245 mg/dL — ABNORMAL HIGH (ref 0–200)
HDL: 58 mg/dL (ref >40)
LDL Cholesterol: 155 mg/dL — ABNORMAL HIGH (ref 0–99)
Total CHOL/HDL Ratio: 4.2 RATIO
Triglycerides: 162 mg/dL — ABNORMAL HIGH (ref <150)
VLDL: 32 mg/dL (ref 0–40)

## 2020-03-19 LAB — URINE CULTURE
Culture: 50000 — AB
Special Requests: NORMAL

## 2020-03-19 LAB — BASIC METABOLIC PANEL
Anion gap: 10 (ref 5–15)
BUN: 23 mg/dL (ref 8–23)
CO2: 26 mmol/L (ref 22–32)
Calcium: 9.1 mg/dL (ref 8.9–10.3)
Chloride: 104 mmol/L (ref 98–111)
Creatinine, Ser: 1.29 mg/dL — ABNORMAL HIGH (ref 0.44–1.00)
GFR, Estimated: 41 mL/min — ABNORMAL LOW (ref >60)
Glucose, Bld: 121 mg/dL — ABNORMAL HIGH (ref 70–99)
Potassium: 4.1 mmol/L (ref 3.5–5.1)
Sodium: 140 mmol/L (ref 135–145)

## 2020-03-19 LAB — PROCALCITONIN: Procalcitonin: <0.1 ng/mL

## 2020-03-19 MED ORDER — ORAL CARE MOUTH RINSE
15.0000 mL | Freq: Two times a day (BID) | OROMUCOSAL | Status: DC
  Administered 2020-03-19 – 2020-05-01 (×86): 15 mL via OROMUCOSAL

## 2020-03-19 MED ORDER — ASPIRIN 81 MG PO CHEW
81.0000 mg | CHEWABLE_TABLET | Freq: Every day | ORAL | Status: DC
  Filled 2020-03-19: qty 1

## 2020-03-19 MED ORDER — ASPIRIN 300 MG RE SUPP
300.0000 mg | Freq: Every day | RECTAL | Status: DC
  Administered 2020-03-19 – 2020-03-20 (×2): 300 mg via RECTAL
  Filled 2020-03-19 (×2): qty 1

## 2020-03-19 MED ORDER — CHLORHEXIDINE GLUCONATE 0.12 % MT SOLN
15.0000 mL | Freq: Two times a day (BID) | OROMUCOSAL | Status: DC
  Administered 2020-03-19 – 2020-05-01 (×87): 15 mL via OROMUCOSAL
  Filled 2020-03-19 (×82): qty 15

## 2020-03-19 MED FILL — Medication: Qty: 1 | Status: AC

## 2020-03-19 NOTE — Progress Notes (Signed)
Pt noted to have EKG pauses, bradycardia down to 20s, frequent PVC's/PAC's. Pt remains unresponsive except to pain. eLink provider called. Pads placed on pt. Pt also noted to be hypotensive. 1L LR bolus to be given, with initiation of levo gtt if needed to maintain MAP >65, SBP >90. Stat ABG to be drawn. Continue to monitor.

## 2020-03-19 NOTE — Progress Notes (Signed)
NAME:  Hannah Fischer, MRN:  [id number], DOB:  [birth year], LOS: 1 ADMISSION DATE:  03/18/2020, CONSULTATION DATE:  03/18/2020 REFERRING MD:  Dr. Stevie Kern, CHIEF COMPLAINT:  AMS  Brief History:  87 year old female presenting from home with acute altered mental status, unexplained at first, initial imaging negative for LVO.  PCCM admitted for concern of airway protection.  MRI showing bilateral thalamic strokes. Negative infectious workup thus far.   History of Present Illness:  HPI obtained from medical chart review and per husband at bedside as patient remains encephalopathic.   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   Husband reports patient was in her normal state of health yesterday, as they went bowling and out to dinner.  Went to bed around 1130 to midnight but today, he was unable to wake her up around noon.  Husband reports no new meds or recent complaints, illness, or urinary issues.  Reports her thyroid dose was recently changed but then changed back one week later.    In ER, was evaluated by stroke team on arrival. Noted to be afebrile, bradycardic ~50's and hypertensive 194/70, saturations 100% on 2L [place].  Labs noted for glucose 176, BUN 24, sCr around baseline 1.10, albumin 2.9, protein 5.5, WBC 3.8, Hgb 11.8, normal coags, UA not fully complete but neg for nitrates, WBC >50, CXR without acute findings- noted chronic elevation of right hemidiaphragm, EKG showing sinus bradycardia with prolonged QTc.  Thus far, imaging has been negative for LVO with CTH, CTA head/ neck and perfusion.  Her encephalopathy is ongoing, EDP concerned for ability for air way protection with her encephalopathy, ask PCCM to eval for intubation/ ICU admit.    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  Redemonstrated chronic encephalomalacia within the anterior right temporal lobe.  Stable background mild-to-moderate cerebral atrophy and chronic small vessel ischemic disease.  2/12 CTA head/ neck/ perfusion >> CTA neck: 1. Significantly limited evaluation due to motion degradation and suboptimal contrast timing. 2. The common and internal carotid arteries are patent within the neck. Moderate/severe calcified plaque within the right carotid bifurcation and proximal ICA. Quantification of stenosis at this site is not possible due to the degree of motion degradation. Calcified plaque within the left carotid bifurcation and proximal ICA with estimated up to 60% stenosis of the proximal left ICA. 3. Streak and beam hardening artifact precludes adequate evaluation of the proximal vertebral arteries. Within this limitation, the vertebral arteries are patent within the neck. However, there is gradually diminishing enhancement within the mid to distal cervical right vertebral artery and the intracranial right vertebral artery now appears highly stenosed or occluded (however, this could be artifactual in the setting of suboptimal contrast timing).  CTA head: 1. Limited evaluation due to suboptimal contrast timing. 2. Paucity of enhancement within the non dominant intracranial right vertebral artery. This vessel  may now be highly stenosed or occluded (however, this could be artifactual in the setting of suboptimal  contrast timing). 3. Moderate/severe stenosis within a mid M2 left MCA branch vessel.  4. Elsewhere, no intracranial large vessel occlusion or proximal high-grade arterial stenosis is identified. 5. Prominent calcified plaque within the intracranial ICAs bilaterally with suspected moderate bilateral stenosis.  CT perfusion head: The perfusion software identifies no core infarct. The perfusion software identifies no critically hypoperfused parenchyma utilizing the Tmax>6 seconds threshold.  2/13 MR brain >> 1. Punctate acute infarction in the left superior cerebellum adjacent to the superior cerebellar peduncle. Bilateral thalamic acute infarction extending back into the mid brain. This is consistent with artery of Percheron infarction. No hemorrhage or mass effect. 2. Old infarction at the left parietooccipital junction. 3. Chronic small-vessel ischemic changes elsewhere throughout the brain.  Micro Data:  2/12 SARS 2 >> neg 2/12 BCx2 >> 2/12 UC >>  Antimicrobials:  2/12 azithro >> 2/13 2/12 ceftriaxone >> 2/13  Interim History / Subjective:  tmax 99.3 Some bradycardia overnight, since resolved No change in mental status Husband at bedside  Objective   Blood pressure 127/64, pulse (!) 54, temperature 99.3 F (37.4 C), temperature source Axillary, resp. rate (!) 27, height 5\' 3"  (1.6 m), weight 80.2 kg, SpO2 100 %.        Intake/Output Summary (Last 24 hours) at 03/19/2020 1104 Last data filed at 03/19/2020 1000 Gross per 24 hour  Intake 1344.06 ml  Output 300 ml  Net 1044.06 ml   Filed Weights   03/18/20 1834 03/19/20 0030 03/19/20 0400  Weight: 78.5 kg 80.2 kg 80.2 kg   Examination: General:  Elderly female sitting upright in bed in NAD HEENT: MM pink/moist, squints when trying to assess pupils, right non reactive 2, left 4/sluggish- not new per RN, no drooling Neuro: with withdrawal in extremities to noxious stimuli but does not open eyes or follow commands CV: rr,  NSR, + murmur PULM:  Non labored, coarse anteriorly, diminished in bases GI: soft, NT, +bs, purwick  Extremities: warm/dry, no LE edema  Skin: no rashes  UOP 300 ml/ 24 hr Net +1L  Pending am labs   Resolved Hospital Problem list    Assessment & Plan:   Bilateral thalamic infarct extending back to the midbrain c/w Percheron stroke hx of prior 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  Per Neurology type of stroke can explain her mental status - Appreciate further Neurology recommendations, planning on starting stimulate to see if helps with mental status, but overall, prognosis is guarded - stroke workup per Neurology - EEG ordered - TSH 6.651, ammonia 24, UDS ordered  - seizure precautions - serial neuro exams - ASA per neurology  - SLP/ PT/ PT-cortrak ordered for meds/ nutrition    HTN - allow permissive HTN with stroke  - holding home amlodipine, plavix, losartan, propranolol, crestor while altered/ NPO   At risk for airway insufficiency given encephalopathy  - Remains full code at this time, continue to monitor - remains high intubation risk given continued depressed mental status  - NPO; cortrak ordered  - aspiration precautions  Prolonged QTC - avoid QTc prolonging measures   Leukopenia - remains afebrile  - PCT neg  - follow cultures - stop empiric ceftriaxone/ azithro  - trend WBC/ fever curve, monitor clinically   DM - CBG and SSI sensitive   Hypothyroidism -TSH 6.651, restart home synthroid after  cortrak placed   Chronic pyuria - UC pending - holding home chronic nitrofurantoin   Best practice (evaluated daily)  Diet: NPO Pain/Anxiety/Delirium protocol (if indicated): n/a VAP protocol (if indicated): n/a DVT prophylaxis: heparin SQ/ SCDs GI prophylaxis: n/a Glucose control: SSI  Mobility: BR Disposition: ICU   Goals of Care:  Last date of multidisciplinary goals  of care discussion:2/12 Family and staff present: husband, Dr. Chestine Spore, myself Summary of discussion: Full code Follow up goals of care discussion due: 2/18 Code Status: full   Husband at bedside. Wishes to continue short term full aggressive care to see if patient has any neurological improvement.  Would not want prolonged life support  Labs   CBC: Recent Labs  Lab 03/18/20 1417 03/18/20 1420 03/18/20 1719 03/18/20 1932  WBC  --  3.8*  --   --   NEUTROABS  --  1.9  --   --   HGB 12.2 11.8* 12.9 12.2  HCT 36.0 37.7 38.0 36.0  MCV  --  101.9*  --   --   PLT  --  182  --   --     Basic Metabolic Panel: Recent Labs  Lab 03/18/20 1417 03/18/20 1420 03/18/20 1719 03/18/20 1932 03/18/20 1936  NA 139 139 138 139  --   K 4.2 4.4 4.5 3.9  --   CL 101 101  --   --   --   CO2  --  27  --   --   --   GLUCOSE 172* 176*  --   --   --   BUN 26* 24*  --   --   --   CREATININE 1.00 1.10*  --   --   --   CALCIUM  --  8.8*  --   --   --   MG  --   --   --   --  1.7  PHOS  --   --   --   --  3.6   GFR: Estimated Creatinine Clearance: 37.5 mL/min (A) (by C-G formula based on SCr of 1.1 mg/dL (H)). Recent Labs  Lab 03/18/20 1420 03/18/20 1615 03/18/20 1936 03/19/20 0424  PROCALCITON  --   --  <0.10 <0.10  WBC 3.8*  --   --   --   LATICACIDVEN  --  1.1  --   --     Liver Function Tests: Recent Labs  Lab 03/18/20 1420  AST 20  ALT 11  ALKPHOS 56  BILITOT 0.6  PROT 5.5*  ALBUMIN 2.9*   No results for input(s): LIPASE, AMYLASE in the last 168 hours. Recent Labs  Lab 03/18/20 1615  AMMONIA 24    ABG    Component Value Date/Time   PHART 7.436 03/18/2020 1932   PCO2ART 42.5 03/18/2020 1932   PO2ART 86 03/18/2020 1932   HCO3 28.9 (H) 03/18/2020 1932   TCO2 30 03/18/2020 1932   O2SAT 97.0 03/18/2020 1932     Coagulation Profile: Recent Labs  Lab 03/18/20 1420  INR 1.0    Cardiac Enzymes: No results for input(s): CKTOTAL, CKMB, CKMBINDEX, TROPONINI in the  last 168 hours.  HbA1C: Hgb A1c MFr Bld  Date/Time Value Ref Range Status  03/18/2020 07:36 PM 7.6 (H) 4.8 - 5.6 % Final    Comment:    (NOTE) Pre diabetes:          5.7%-6.4%  Diabetes:              >6.4%  Glycemic control for   <7.0% adults with diabetes   11/23/2019 11:04 PM 7.5 (H) 4.8 - 5.6 % Final    Comment:    (NOTE)         Prediabetes: 5.7 - 6.4         Diabetes: >6.4         Glycemic control for adults with diabetes: <7.0     CBG: Recent Labs  Lab 03/18/20 1443 03/18/20 1922 03/18/20 2316 03/19/20 0315 03/19/20 0732  GLUCAP 156* 141* 141* 176* 167*            Posey BoyerBrooke Graclynn Vanantwerp, ACNP [facility] 03/19/2020, 11:04 AM

## 2020-03-19 NOTE — Procedures (Signed)
Patient Name: Hannah Fischer  MRN: [id number]  Epilepsy Attending: Charlsie Quest  Referring Physician/Provider: Dr Wallis Mart Date: 03/19/2020 Duration: 24.36 mins  Patient history: 87 year old Caucasian lady with sudden onset of unresponsiveness and altered mental status. EEG to evaluate for seizure  Level of alertness: lethargic  AEDs during EEG study: None  Technical aspects: This EEG study was done with scalp electrodes positioned according to the 10-20 International system of electrode placement. Electrical activity was acquired at a sampling rate of 500Hz  and reviewed with a high frequency filter of 70Hz  and a low frequency filter of 1Hz . EEG data were recorded continuously and digitally stored.   Description: No posterior dominant rhythm was seen. EEG showed continuous generalized 3 to 6 Hz theta-delta slowing.  Hyperventilation and photic stimulation were not performed.     ABNORMALITY -Continuous slow, generalized  IMPRESSION: This study is suggestive of moderate diffuse encephalopathy, nonspecific etiology. No seizures or epileptiform discharges were seen throughout the recording.  Teshawn Moan 

## 2020-03-19 NOTE — Progress Notes (Signed)
EEG complete - results pending 

## 2020-03-19 NOTE — Progress Notes (Signed)
eLink Physician-Brief Progress Note Patient Name: Hannah Fischer DOB: 1932-06-21 MRN: [id number]   Date of Service  03/19/2020  HPI/Events of Note  SBP 190-200. HR 90s.  MRI shows a small punctate area compatible with acute ischemic stroke in the left superior cerebellum (adjacent superior cerebellar peduncle) and bilateral thalamic acute infarction extending back into the mid-brain.   eICU Interventions  Allow permissive hypertension as high as SBP 220 mmHg and DBP 120 mmHg (patient has an acute ischemic stroke and is unfortunately not a candidate for thrombolytic therapy).     Intervention Category Intermediate Interventions: Hypertension - evaluation and management  Marveen Reeks Raghad Lorenz 03/19/2020, 3:10 AM

## 2020-03-19 NOTE — Progress Notes (Signed)
Stroke Team Progress Note  SUBJECTIVE Patient remains sleepy and hard to arouse and was transferred to the ICU for observation due to concerns about progressing in difficulty maintaining her airway.  She however has remained stable overnight and maintaining oxygen saturation but remains sleepy.  Vital signs are stable.  Neurological exam is unchanged.  MRI scan of the brain shows bilateral medial thalamic and punctate midbrain infarct likely secondary to artery of Percheron involvement.  EEG is pending   OBJECTIVE Most recent Vital Signs: Temp: 99.3 F (37.4 C) (02/13 0800) Temp Source: Axillary (02/13 0800) BP: 145/58 (02/13 1100) Respiratory Rate: (!) 28 O2 Saturdation: 100%  CBG (last 3)  Recent Labs    03/19/20 0315 03/19/20 0732 03/19/20 1124  GLUCAP 176* 167* 113*       Studies:  CT head no acute abnormalities MRI bilateral medial thalamic punctate left superior cerebellar infarct CTA suboptimal due to contrast timing but no LVO.  Diminutive flow in the right vertebral artery ECHO on 11/24/2019 ejection fraction 6065%.  No wall motion abnormalities. LDL 155 mg percent HbA1c 7.6 EEG pending  Physical Exam:     Frail elderly Caucasian lady is lying comfortably in bed.  Not in distress. . Afebrile. Head is nontraumatic. Neck is supple without bruit.    Cardiac exam no murmur or gallop. Lungs are clear to auscultation. Distal pulses are well felt. Neurological Exam :  Patient is sleepy.  She responds to sternal rub with partially opening her eyes and mattering in few words but not able to speak sentences.  She moves right upper and both lower extremities purposefully there is movement against gravity in left upper extremity but appears to have some weakness.  Slight disconjugate eyes at rest with left eye hypertropia both pupils are 3 to 4 mm sluggishly reactive.  Doll's eye movements are sluggish.  She does not blink to threat on either side.  Face appears symmetric.  Plantars  are both upgoing.  Gait not tested ASSESSMENT [name] is a 87 y.o. 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  Tiny left cerebellar punctate infarct may also be a concurrent lacune.  However she has had previous history of embolic as well as small vessel infarcts in October 21 hence strong suspicion for underlying paroxysmal A. fib.  Hospital day # 1  TREATMENT/PLAN Continue ongoing telemetry monitoring and stroke work-up.  Patient is likely going to remain quite sleepy for several days due to the location of her infarcts.  She may need feeding tube and she may not be able to swallow.  Continue rectal aspirin for now but may need anticoagulation if A. fib is found.  Had a long discussion with the patient husband at the bedside about his presentation, strokes, prognosis and answered questions.  He is quite realistic about his expectations states she would not want prolonged ventilatory support but would be okay with short-term intubation if necessary.  Recommend swallow eval and if she is unable to swallow likely will need core track tube for medications and nutrition.  Discussed with critical care team nurse practitioner.  I spent  30 minutes in total face-to-face time with the patient, more than 50% of which was spent in counseling and coordination of care, reviewing test  results, reviewing medication and discussing or reviewing the diagnosis of embolic stroke, the prognosis and treatment options.   Delia Heady, MD Medical Director [facility] Pager: [phone] 03/19/2020 12:08 PM

## 2020-03-19 NOTE — Progress Notes (Signed)
SLP Cancellation Note  Patient Details Name: Hannah Fischer MRN: [id number] DOB: July 27, 1932   Cancelled treatment:       Reason Eval/Treat Not Completed: Fatigue/lethargy limiting ability to participate (Pt unable to maintain alertness for long enough periods to participate in a swallow evaluation at this time. SLP will follow up on subsequent date unless contacted sooner due to change in status.)  Hannah Fischer I. Vear Clock, MS, CCC-SLP Acute Rehabilitation Services Office number [phone] Pager [phone]  Hannah Fischer 03/19/2020, 11:26 AM

## 2020-03-19 NOTE — Progress Notes (Addendum)
MRI of the brain completed-reveals bilateral thalamic infarct extended back to the midbrain-consistent with artery of Percheron stroke.  Bilateral thalamic stroke and midbrain involvement can explain altered mentation and depressed level of consciousness. Start patient on aspirin  Stroke team will continue to follow  -- Milon Dikes, MD Neurologist [facility] Pager: [phone]

## 2020-03-20 DIAGNOSIS — G9340 Encephalopathy, unspecified: Secondary | ICD-10-CM | POA: Diagnosis not present

## 2020-03-20 LAB — BASIC METABOLIC PANEL
Anion gap: 12 (ref 5–15)
BUN: 26 mg/dL — ABNORMAL HIGH (ref 8–23)
CO2: 25 mmol/L (ref 22–32)
Calcium: 8.8 mg/dL — ABNORMAL LOW (ref 8.9–10.3)
Chloride: 104 mmol/L (ref 98–111)
Creatinine, Ser: 1.72 mg/dL — ABNORMAL HIGH (ref 0.44–1.00)
GFR, Estimated: 29 mL/min — ABNORMAL LOW (ref >60)
Glucose, Bld: 144 mg/dL — ABNORMAL HIGH (ref 70–99)
Potassium: 4.2 mmol/L (ref 3.5–5.1)
Sodium: 141 mmol/L (ref 135–145)

## 2020-03-20 LAB — CBC
HCT: 41.5 % (ref 36.0–46.0)
Hemoglobin: 13.4 g/dL (ref 12.0–15.0)
MCH: 32.3 pg (ref 26.0–34.0)
MCHC: 32.3 g/dL (ref 30.0–36.0)
MCV: 100 fL (ref 80.0–100.0)
Platelets: 186 10*3/uL (ref 150–400)
RBC: 4.15 MIL/uL (ref 3.87–5.11)
RDW: 13.5 % (ref 11.5–15.5)
WBC: 6.2 10*3/uL (ref 4.0–10.5)
nRBC: 0 % (ref 0.0–0.2)

## 2020-03-20 LAB — PHOSPHORUS: Phosphorus: 3.9 mg/dL (ref 2.5–4.6)

## 2020-03-20 LAB — GLUCOSE, CAPILLARY
Glucose-Capillary: 152 mg/dL — ABNORMAL HIGH (ref 70–99)
Glucose-Capillary: 122 mg/dL — ABNORMAL HIGH (ref 70–99)
Glucose-Capillary: 127 mg/dL — ABNORMAL HIGH (ref 70–99)
Glucose-Capillary: 125 mg/dL — ABNORMAL HIGH (ref 70–99)
Glucose-Capillary: 144 mg/dL — ABNORMAL HIGH (ref 70–99)

## 2020-03-20 LAB — MAGNESIUM: Magnesium: 2.1 mg/dL (ref 1.7–2.4)

## 2020-03-20 MED ORDER — AMANTADINE HCL 50 MG/5ML PO SOLN
50.0000 mg | Freq: Two times a day (BID) | ORAL | Status: DC
  Administered 2020-03-20 – 2020-03-21 (×3): 50 mg
  Filled 2020-03-20 (×6): qty 5

## 2020-03-20 MED ORDER — PROSOURCE TF PO LIQD
45.0000 mL | Freq: Three times a day (TID) | ORAL | Status: DC
  Administered 2020-03-20 – 2020-05-01 (×122): 45 mL
  Filled 2020-03-20 (×123): qty 45

## 2020-03-20 MED ORDER — OSMOLITE 1.2 CAL PO LIQD
1000.0000 mL | ORAL | Status: DC
  Administered 2020-03-20 – 2020-04-07 (×14): 1000 mL
  Filled 2020-03-20 (×11): qty 1000

## 2020-03-20 MED ORDER — DEXTROSE-NACL 5-0.9 % IV SOLN: INTRAVENOUS | Status: AC

## 2020-03-20 NOTE — Progress Notes (Signed)
eLink Physician-Brief Progress Note Patient Name: Hannah Fischer DOB: [birth year] MRN: [id number]   Date of Service  03/20/2020  HPI/Events of Note  No UO since 7pm, Bladder scan done x 2 with zero results,   IV KVO    PIV's only Cr 1.3 earlier.   Admitted for CVA. On room air. Not on any pressors. On NS at 10 ml/hr.  No feeding. NPO.     eICU Interventions  Start dextrose saline at 50 ml/hr for few hrs. Swallow eval in AM. NPO.      Intervention Category Intermediate Interventions: Oliguria - evaluation and management  Ranee Gosselin 03/20/2020, 2:42 AM

## 2020-03-20 NOTE — Progress Notes (Signed)
Initial Nutrition Assessment  DOCUMENTATION CODES:   Not applicable  INTERVENTION:   Initiate tube feeding via Cortrak: Osmolite 1.2 at 45 ml/h (1080 ml per day) Prosource TF 45 ml TID  Provides 1416 kcal, 93 gm protein, 875 ml free water daily   NUTRITION DIAGNOSIS:   Inadequate oral intake related to inability to eat as evidenced by NPO status.  GOAL:   Patient will meet greater than or equal to 90% of their needs  MONITOR:   Diet advancement,TF tolerance  REASON FOR ASSESSMENT:   Consult Enteral/tube feeding initiation and management  ASSESSMENT:   Pt with PMH of HTN, HLD, Dm, GERD, COVID 02/2018, PE, IBS, cerebral embolic strokes 02/63 with residula L hand weakness now admitted with bilateral thalamic infarct extending back to the midbrain c/w Percheron stroke.   Pt discussed during ICU rounds and with RN.  Pt sleeping unable to arouse to complete SLP evaluation.    2/14 cortrak placed; tip gastric   Medications reviewed and include: SSI Labs reviewed: CBG's: 111-152    NUTRITION - FOCUSED PHYSICAL EXAM:  Flowsheet Row Most Recent Value  Orbital Region No depletion  Upper Arm Region No depletion  Thoracic and Lumbar Region No depletion  Buccal Region No depletion  Temple Region Mild depletion  Clavicle Bone Region Mild depletion  Clavicle and Acromion Bone Region Moderate depletion  Scapular Bone Region Unable to assess  Dorsal Hand Moderate depletion  Patellar Region No depletion  Anterior Thigh Region No depletion  Posterior Calf Region No depletion  Edema (RD Assessment) None  Hair Reviewed  Eyes Unable to assess  Mouth Unable to assess  Skin Reviewed  Nails Reviewed       Diet Order:   Diet Order            Diet NPO time specified  Diet effective now                 EDUCATION NEEDS:   No education needs have been identified at this time  Skin:  Skin Assessment: Reviewed RN Assessment  Last BM:  unknown  Height:   Ht  Readings from Last 1 Encounters:  03/18/20 5\' 3"  (1.6 m)    Weight:   Wt Readings from Last 1 Encounters:  03/19/20 80.2 kg    Ideal Body Weight:  52.2 kg  BMI:  Body mass index is 31.32 kg/m.  Estimated Nutritional Needs:   Kcal:  1400  Protein:  85-100 grams  Fluid:  > 1.5 L/day  03/21/20., RD, LDN, CNSC See AMiON for contact information

## 2020-03-20 NOTE — Progress Notes (Signed)
Stroke Team Progress Note  SUBJECTIVE Patient has remained sleepy and hard to arouse but is slightly more responsive when can follow occasional commands.  She remains hemodynamically stable and is maintaining her airway so far.  Serum creatinine is increased to 1.72 but she had urinary retention requiring several in and out caths last night.  Plan is to insert a Foley today.    Neurological exam is mostly unchanged.  EEG shows moderate generalized slowing in 3 to 6 Hz theta range.  No definite epileptiform activity noted.  No family at the bedside during rounds in a.m. OBJECTIVE Most recent Vital Signs: Temp: 97.9 F (36.6 C) (02/14 1200) Temp Source: Axillary (02/14 1200) BP: 149/73 (02/14 1200) Respiratory Rate: 19 O2 Saturdation: 99%  CBG (last 3)  Recent Labs    03/20/20 0321 03/20/20 0751 03/20/20 1133  GLUCAP 152* 122* 127*       Studies:  CT head no acute abnormalities MRI bilateral medial thalamic punctate left superior cerebellar infarct CTA suboptimal due to contrast timing but no LVO.  Diminutive flow in the right vertebral artery ECHO on 11/24/2019 ejection fraction 6065%.  No wall motion abnormalities. LDL 155 mg percent HbA1c 7.6 EEG pending  Physical Exam:     Frail elderly Caucasian lady is lying comfortably in bed.  Not in distress. . Afebrile. Head is nontraumatic. Neck is supple without bruit.    Cardiac exam no murmur or gallop. Lungs are clear to auscultation. Distal pulses are well felt. Neurological Exam :  Patient is sleepy.  She responds to sternal rub with partially opening her eyes and muttering in few words but not able to speak sentences.  She was able to stick out her tongue and move her extremities to command. She moves right upper and both lower extremities purposefully there is movement against gravity in left upper extremity but appears to have some weakness.  Slight dysconjugate gaze with both eyes deviated downwards at rest with left eye  hypertropia both pupils are 3 to 4 mm sluggishly reactive.  Corneal reflexes are brisk Doll's eye movements are sluggish.  She does not blink to threat on either side.  Face appears symmetric.  Plantars are both upgoing.  Gait not tested ASSESSMENT Hannah Fischer is a 87 y.o. 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  Tiny left cerebellar punctate infarct may also be a concurrent lacune.  However she has had previous history of embolic as well as small vessel infarcts in October 21 hence strong suspicion for underlying paroxysmal A. fib.  Hospital day # 2  TREATMENT/PLAN Patient neurological exam is minimally better.  Expect slow improvement over the next few days.  Patient is unlikely to be able to swallow it and she is more awake and agree with core track tube placement and starting nutrition.  We will try amantadine 100 mg twice daily to improve wakefulness.  No family available at the bedside for discussion during a.m. rounds discussed with [facility] critical care team nurse practitioner.  I spent  30 minutes in total face-to-face time with the patient, more than 50% of which was spent in counseling and coordination of care, reviewing test results, reviewing medication and discussing or reviewing the diagnosis of embolic stroke, the prognosis and  treatment options.   Delia Heady, MD Medical Director [facility] Pager: [phone] 03/20/2020 12:38 PM

## 2020-03-20 NOTE — Procedures (Signed)
Cortrak  Person Inserting Tube:  Durell Lofaso, RD Tube Type:  Cortrak - 43 inches Tube Location:  Left nare Initial Placement:  Stomach Secured by: Bridle Technique Used to Measure Tube Placement:  Documented cm marking at nare/ corner of mouth Cortrak Secured At:  60 cm   No x-ray is required. RN may begin using tube.   If the tube becomes dislodged please keep the tube and contact the Cortrak team at [email or web address] (password [email or web address]) for replacement.  If after hours and replacement cannot be delayed, place a NG tube and confirm placement with an abdominal x-ray.    Sumayya Muha RD, LDN Clinical Nutrition Pager listed in AMION   

## 2020-03-20 NOTE — Progress Notes (Signed)
Received pt from 4N, with closed eyes, no verbal response and not following  Simple command, with O2 via  not in distress, with NGT feeding, with foley catheter to bag  With yellow urine output, with DVD pump

## 2020-03-20 NOTE — Progress Notes (Signed)
SLP Cancellation Note  Patient Details Name: Hannah Fischer MRN: [id number] DOB: 1932-05-13   Cancelled treatment:        Pt moaned to painful stimuli, shook head, eyes closed. Not yet able to participate. RN states she is getting Cortrak today- will follow along.   Royce Macadamia 03/20/2020, 10:41 AM Breck Coons Lonell Face.Ed Nurse, children's [phone] Office [phone]

## 2020-03-20 NOTE — Progress Notes (Signed)
NAME:  Hannah Fischer, MRN:  [id number], DOB:  1932/02/26, LOS: 2 ADMISSION DATE:  03/18/2020, CONSULTATION DATE:  03/18/2020 REFERRING MD:  Dr. Stevie Kern, CHIEF COMPLAINT:  AMS  Brief History:  87 year old female presenting from home with acute altered mental status, unexplained at first, initial imaging negative for LVO.  PCCM admitted for concern of airway protection.  MRI showing bilateral thalamic strokes. Negative infectious workup thus far.   History of Present Illness:  HPI obtained from medical chart review and per husband at bedside as patient remains encephalopathic.   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   Husband reports patient was in her normal state of health yesterday, as they went bowling and out to dinner.  Went to bed around 1130 to midnight but today, he was unable to wake her up around noon.  Husband reports no new meds or recent complaints, illness, or urinary issues.  Reports her thyroid dose was recently changed but then changed back one week later.    In ER, was evaluated by stroke team on arrival. Noted to be afebrile, bradycardic ~50's and hypertensive 194/70, saturations 100% on 2L [place].  Labs noted for glucose 176, BUN 24, sCr around baseline 1.10, albumin 2.9, protein 5.5, WBC 3.8, Hgb 11.8, normal coags, UA not fully complete but neg for nitrates, WBC >50, CXR without acute findings- noted chronic elevation of right hemidiaphragm, EKG showing sinus bradycardia with prolonged QTc.  Thus far, imaging has been negative for LVO with CTH, CTA head/ neck and perfusion.  Her encephalopathy is ongoing, EDP concerned for ability for air way protection with her encephalopathy, ask PCCM to eval for intubation/ ICU admit.    Past Medical History:  HTN, HLD, DM, GERD, hypothyroidism,  cerebral embolic strokes in 11/2019 with residual left hand weakness, DJD, anemia, COVID (02/2018 per husband), PE (2013), IBS, and UTI's on chronic nitrofurantoin, chronic pain  Significant Hospital Events:  2/12 Admit   Consults:  Neurology   Procedures:   Significant Diagnostic Tests:  2/12 [facility] >> No evidence of acute intracranial abnormality.  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  Redemonstrated chronic encephalomalacia within the anterior right temporal lobe.  Stable background mild-to-moderate cerebral atrophy and chronic small vessel ischemic disease.  2/12 CTA head/ neck/ perfusion >> CTA neck: 1. Significantly limited evaluation due to motion degradation and suboptimal contrast timing. 2. The common and internal carotid arteries are patent within the neck. Moderate/severe calcified plaque within the right carotid bifurcation and proximal ICA. Quantification of stenosis at this site is not possible due to the degree of motion degradation. Calcified plaque within the left carotid bifurcation and proximal ICA with estimated up to 60% stenosis of the proximal left ICA. 3. Streak and beam hardening artifact precludes adequate evaluation of the proximal vertebral arteries. Within this limitation, the vertebral arteries are patent within the neck. However, there is gradually diminishing enhancement within the mid to distal cervical right vertebral artery and the intracranial right vertebral artery now appears highly stenosed or occluded (however, this could be artifactual in the setting of suboptimal contrast timing).  CTA head: 1. Limited evaluation due to suboptimal contrast timing. 2. Paucity of enhancement within the non dominant intracranial right vertebral artery. This vessel  may now be highly stenosed or occluded (however, this could be artifactual in the setting of suboptimal  contrast timing). 3. Moderate/severe stenosis within a mid M2 left MCA branch vessel.  4. Elsewhere, no intracranial large vessel occlusion or proximal high-grade arterial stenosis is identified. 5. Prominent calcified plaque within the intracranial ICAs bilaterally with suspected moderate bilateral stenosis.  CT perfusion head: The perfusion software identifies no core infarct. The perfusion software identifies no critically hypoperfused parenchyma utilizing the Tmax>6 seconds threshold.  2/13 MR brain >> 1. Punctate acute infarction in the left superior cerebellum adjacent to the superior cerebellar peduncle. Bilateral thalamic acute infarction extending back into the mid brain. This is consistent with artery of Percheron infarction. No hemorrhage or mass effect. 2. Old infarction at the left parietooccipital junction. 3. Chronic small-vessel ischemic changes elsewhere throughout the brain.  Micro Data:  2/12 SARS 2 >> neg 2/12 BCx2 >> 2/12 UC >> 50k GBS  Antimicrobials:  2/12 azithro >> 2/13 2/12 ceftriaxone >> 2/13  Interim History / Subjective:  Urinary retention overnight s/p several I/O sCr 1.29-> 1.72 Started on MIVF overnight Improved mental status this morning Remains afebrile, hemodynamically stable, and on room air   Objective   Blood pressure (!) 166/82, pulse (!) 54, temperature 97.7 F (36.5 C), temperature source Axillary, resp. rate (!) 23, height 5\' 3"  (1.6 m), weight 80.2 kg, SpO2 99 %.        Intake/Output Summary (Last 24 hours) at 03/20/2020 1037 Last data filed at 03/20/2020 1000 Gross per 24 hour  Intake 424.64 ml  Output 750 ml  Net -325.36 ml   Filed Weights   03/18/20 1834 03/19/20 0030 03/19/20 0400  Weight: 78.5 kg 80.2 kg 80.2 kg   Examination: General:  Elderly female lying in bed in NAD HEENT: MM pink/moist, pupils- 4/sluggish, left 3/non reactive Neuro: moans at times, grimaces more but will not open eyes or track eye movements,  follows intermittent commands- able to stick out tongue and wiggle toes but not grip hands, localizes with RUE, able to hold LUE to gravity, spont moves LE CV: rr, NSR, + murmur PULM:  Non labored, CTA, no oral secretions GI: soft, bs active, NT Extremities: warm/dry, no LE edema  Skin: no rashes   UOP 650 ml/ 24 hr -395 ml/ net +618 ml   Resolved Hospital Problem list    Assessment & Plan:   Bilateral thalamic infarct extending back to the midbrain c/w Percheron stroke hx of prior cerebral embolic strokes in 11/2019 with residual left hand weakness - this type of stroke affects sleep center of brain.  Overall prognosis is poor but she has made some improvement in neuro exam since yesterday.  Neurology planning to start stimulant today to help with wakefulness   - permissive hypertension up to 220  - serial neuro exams - ASA per neurology  - SLP/ PT/ PT ongoing, cortrak to be placed today for nutrition/ meds -cortrak ordered for meds/ nutrition  - stable for transfer today to PCU, no current ICU needs, airway has been stable.  Will transfer to PCU and TRH as of 2/15.  PCCM will be available as needed.   HTN - allow permissive HTN  - holding home amlodipine, plavix, losartan, propranolol, crestor    At risk for airway insufficiency given encephalopathy  - Remains full code at this time, continue to monitor, overall stable since admit - NPO; cortrak to be placed today  - aspiration precautions  Prolonged QTC - avoid QTc  prolonging measures  - telemonitoring  AKI - likely pre-renal given NPO +/- possible post-obstructive given urinary retention - will place foley - unclear if this time of stroke affects her ability to urinate - continue MIVF - Trend BMP / urinary output - Replace electrolytes as indicated - Avoid nephrotoxic agents, ensure adequate renal perfusion  Leukopenia- resolved  - remains afebrile, normal WBC, neg PCT - UC colonized - trend WBC/ fever curve,  monitor clinically   DM - CBG and SSI sensitive   Hypothyroidism -TSH 6.651, restart home synthroid after cortrak placed   Chronic pyuria - holding home chronic nitrofurantoin, ideally given her age, should not be continued on discharge   Best practice (evaluated daily)  Diet: NPO, start TF once cortrak placed  Pain/Anxiety/Delirium protocol (if indicated): n/a VAP protocol (if indicated): n/a DVT prophylaxis: heparin SQ/ SCDs GI prophylaxis: n/a Glucose control: SSI  Mobility: BR Disposition: ICU -> tx to PCU and TRH as of 2/15  Goals of Care:  Last date of multidisciplinary goals of care discussion:2/12 Family and staff present: husband, Dr. Neomia Glass, NP Summary of discussion: Full code, Wishes to continue short term full aggressive care to see if patient has any neurological improvement.  Would not want prolonged life support Follow up goals of care discussion due: 2/18 Code Status: full   Pending husband update 2/14.   Labs   CBC: Recent Labs  Lab 03/18/20 1420 03/18/20 1719 03/18/20 1932 03/19/20 1128 03/20/20 0504  WBC 3.8*  --   --  7.2 6.2  NEUTROABS 1.9  --   --   --   --   HGB 11.8* 12.9 12.2 12.8 13.4  HCT 37.7 38.0 36.0 39.8 41.5  MCV 101.9*  --   --  98.8 100.0  PLT 182  --   --  213 186    Basic Metabolic Panel: Recent Labs  Lab 03/18/20 1417 03/18/20 1420 03/18/20 1719 03/18/20 1932 03/18/20 1936 03/19/20 1128 03/20/20 0504  NA 139 139 138 139  --  140 141  K 4.2 4.4 4.5 3.9  --  4.1 4.2  CL 101 101  --   --   --  104 104  CO2  --  27  --   --   --  26 25  GLUCOSE 172* 176*  --   --   --  121* 144*  BUN 26* 24*  --   --   --  23 26*  CREATININE 1.00 1.10*  --   --   --  1.29* 1.72*  CALCIUM  --  8.8*  --   --   --  9.1 8.8*  MG  --   --   --   --  1.7  --   --   PHOS  --   --   --   --  3.6  --   --    GFR: Estimated Creatinine Clearance: 24 mL/min (A) (by C-G formula based on SCr of 1.72 mg/dL (H)). Recent Labs  Lab  03/18/20 1420 03/18/20 1615 03/18/20 1936 03/19/20 0424 03/19/20 1128 03/20/20 0504  PROCALCITON  --   --  <0.10 <0.10  --   --   WBC 3.8*  --   --   --  7.2 6.2  LATICACIDVEN  --  1.1  --   --   --   --     Liver Function Tests: Recent Labs  Lab 03/18/20 1420  AST 20  ALT 11  ALKPHOS 56  BILITOT 0.6  PROT 5.5*  ALBUMIN 2.9*   No results for input(s): LIPASE, AMYLASE in the last 168 hours. Recent Labs  Lab 03/18/20 1615  AMMONIA 24    ABG    Component Value Date/Time   PHART 7.436 03/18/2020 1932   PCO2ART 42.5 03/18/2020 1932   PO2ART 86 03/18/2020 1932   HCO3 28.9 (H) 03/18/2020 1932   TCO2 30 03/18/2020 1932   O2SAT 97.0 03/18/2020 1932     Coagulation Profile: Recent Labs  Lab 03/18/20 1420  INR 1.0    Cardiac Enzymes: No results for input(s): CKTOTAL, CKMB, CKMBINDEX, TROPONINI in the last 168 hours.  HbA1C: Hgb A1c MFr Bld  Date/Time Value Ref Range Status  03/18/2020 07:36 PM 7.6 (H) 4.8 - 5.6 % Final    Comment:    (NOTE) Pre diabetes:          5.7%-6.4%  Diabetes:              >6.4%  Glycemic control for   <7.0% adults with diabetes   11/23/2019 11:04 PM 7.5 (H) 4.8 - 5.6 % Final    Comment:    (NOTE)         Prediabetes: 5.7 - 6.4         Diabetes: >6.4         Glycemic control for adults with diabetes: <7.0     CBG: Recent Labs  Lab 03/19/20 1536 03/19/20 1943 03/19/20 2322 03/20/20 0321 03/20/20 0751  GLUCAP 122* 124* 111* 152* 122*           Posey BoyerBrooke Kedron Uno, ACNP [facility] 03/20/2020, 10:37 AM

## 2020-03-21 ENCOUNTER — Inpatient Hospital Stay (HOSPITAL_COMMUNITY): Payer: Medicare Other

## 2020-03-21 DIAGNOSIS — Z7189 Other specified counseling: Secondary | ICD-10-CM | POA: Diagnosis not present

## 2020-03-21 DIAGNOSIS — Z515 Encounter for palliative care: Secondary | ICD-10-CM | POA: Diagnosis not present

## 2020-03-21 DIAGNOSIS — Z66 Do not resuscitate: Secondary | ICD-10-CM | POA: Diagnosis not present

## 2020-03-21 DIAGNOSIS — R4189 Other symptoms and signs involving cognitive functions and awareness: Secondary | ICD-10-CM | POA: Diagnosis not present

## 2020-03-21 DIAGNOSIS — G9340 Encephalopathy, unspecified: Secondary | ICD-10-CM | POA: Diagnosis not present

## 2020-03-21 DIAGNOSIS — I639 Cerebral infarction, unspecified: Secondary | ICD-10-CM | POA: Diagnosis not present

## 2020-03-21 DIAGNOSIS — N179 Acute kidney failure, unspecified: Secondary | ICD-10-CM | POA: Diagnosis not present

## 2020-03-21 LAB — BASIC METABOLIC PANEL
Anion gap: 11 (ref 5–15)
BUN: 27 mg/dL — ABNORMAL HIGH (ref 8–23)
CO2: 25 mmol/L (ref 22–32)
Calcium: 8.9 mg/dL (ref 8.9–10.3)
Chloride: 105 mmol/L (ref 98–111)
Creatinine, Ser: 1.75 mg/dL — ABNORMAL HIGH (ref 0.44–1.00)
GFR, Estimated: 28 mL/min — ABNORMAL LOW (ref >60)
Glucose, Bld: 191 mg/dL — ABNORMAL HIGH (ref 70–99)
Potassium: 3.9 mmol/L (ref 3.5–5.1)
Sodium: 141 mmol/L (ref 135–145)

## 2020-03-21 LAB — CBC
HCT: 38.8 % (ref 36.0–46.0)
Hemoglobin: 13 g/dL (ref 12.0–15.0)
MCH: 33.2 pg (ref 26.0–34.0)
MCHC: 33.5 g/dL (ref 30.0–36.0)
MCV: 99 fL (ref 80.0–100.0)
Platelets: 187 10*3/uL (ref 150–400)
RBC: 3.92 MIL/uL (ref 3.87–5.11)
RDW: 13.5 % (ref 11.5–15.5)
WBC: 5.6 10*3/uL (ref 4.0–10.5)
nRBC: 0 % (ref 0.0–0.2)

## 2020-03-21 LAB — MAGNESIUM
Magnesium: 2 mg/dL (ref 1.7–2.4)
Magnesium: 2.1 mg/dL (ref 1.7–2.4)

## 2020-03-21 LAB — GLUCOSE, CAPILLARY
Glucose-Capillary: 155 mg/dL — ABNORMAL HIGH (ref 70–99)
Glucose-Capillary: 179 mg/dL — ABNORMAL HIGH (ref 70–99)
Glucose-Capillary: 187 mg/dL — ABNORMAL HIGH (ref 70–99)
Glucose-Capillary: 189 mg/dL — ABNORMAL HIGH (ref 70–99)
Glucose-Capillary: 190 mg/dL — ABNORMAL HIGH (ref 70–99)
Glucose-Capillary: 201 mg/dL — ABNORMAL HIGH (ref 70–99)

## 2020-03-21 LAB — PHOSPHORUS
Phosphorus: 3.9 mg/dL (ref 2.5–4.6)
Phosphorus: 3.9 mg/dL (ref 2.5–4.6)

## 2020-03-21 MED ORDER — POLYETHYLENE GLYCOL 3350 17 G PO PACK: 17.0000 g | PACK | Freq: Every day | ORAL | Status: DC | PRN

## 2020-03-21 MED ORDER — GADOBUTROL 1 MMOL/ML IV SOLN
8.0000 mL | Freq: Once | INTRAVENOUS | Status: AC | PRN
  Administered 2020-03-21: 8 mL via INTRAVENOUS

## 2020-03-21 MED ORDER — ROSUVASTATIN CALCIUM 5 MG PO TABS: 5.0000 mg | ORAL_TABLET | Freq: Every day | ORAL | Status: DC

## 2020-03-21 MED ORDER — DOCUSATE SODIUM 50 MG/5ML PO LIQD
100.0000 mg | Freq: Two times a day (BID) | ORAL | Status: DC | PRN
  Administered 2020-04-18 – 2020-04-21 (×2): 100 mg
  Filled 2020-03-21 (×2): qty 10

## 2020-03-21 MED ORDER — ASPIRIN 81 MG PO CHEW
81.0000 mg | CHEWABLE_TABLET | Freq: Every day | ORAL | Status: DC
  Administered 2020-03-21 – 2020-03-24 (×4): 81 mg
  Filled 2020-03-21 (×3): qty 1

## 2020-03-21 MED ORDER — ROSUVASTATIN CALCIUM 5 MG PO TABS
5.0000 mg | ORAL_TABLET | Freq: Every day | ORAL | Status: DC
  Administered 2020-03-22 – 2020-03-24 (×3): 5 mg
  Filled 2020-03-21 (×3): qty 1

## 2020-03-21 MED ORDER — ASPIRIN 300 MG RE SUPP
300.0000 mg | Freq: Every day | RECTAL | Status: DC
  Filled 2020-03-21: qty 1

## 2020-03-21 MED ORDER — LEVOTHYROXINE SODIUM 112 MCG PO TABS
112.0000 ug | ORAL_TABLET | Freq: Every day | ORAL | Status: DC
  Administered 2020-03-22 – 2020-05-01 (×39): 112 ug
  Filled 2020-03-21 (×40): qty 1

## 2020-03-21 MED ORDER — LEVOTHYROXINE SODIUM 112 MCG PO TABS: 112.0000 ug | ORAL_TABLET | Freq: Every day | ORAL | Status: DC

## 2020-03-21 MED ORDER — ROSUVASTATIN CALCIUM 5 MG PO TABS: 5.0000 mg | ORAL_TABLET | ORAL | Status: DC

## 2020-03-21 MED ORDER — AMANTADINE HCL 50 MG/5ML PO SOLN
100.0000 mg | Freq: Two times a day (BID) | ORAL | Status: DC
  Administered 2020-03-21 – 2020-03-25 (×8): 100 mg
  Filled 2020-03-21 (×10): qty 10

## 2020-03-21 NOTE — Evaluation (Signed)
Physical Therapy Evaluation Patient Details Name: Hannah Fischer MRN: [id number] DOB: October 27, 1932 Today's Date: 03/21/2020   History of Present Illness  Pt is an 87 y/o female admitted following being found unresponsive. Found to have bilateral medial thalamic and punctate left superior cerebellar infarcts. 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  Clinical Impression  Pt admitted secondary to problem above with deficits below. Pt keeping eyes closed throughout and with limited command following. Requiring total A for bed mobility and max A for sitting balance. Pt did have response to painful stimuli and spontaneous movement of BLE throughout session. Given current deficits feel pt will benefit from post acute rehab. Should pt continue to improve and increased participation, pt may benefit from CIR level therapies. Will continue to follow acutely and update recommendations as appropriate.     Follow Up Recommendations SNF;Supervision/Assistance - 24 hour (vs CIR if pt progresses well)    Equipment Recommendations  Wheelchair cushion (measurements PT);Wheelchair (measurements PT);Hospital bed (hoyer lift with pad)    Recommendations for Other Services       Precautions / Restrictions Precautions Precautions: Fall Precaution Comments: cortrak tube Restrictions Weight Bearing Restrictions: No      Mobility  Bed Mobility Overal bed mobility: Needs Assistance Bed Mobility: Supine to Sit;Sit to Supine     Supine to sit: Total assist Sit to supine: Total assist   General bed mobility comments: Pt requiring total A for trunk and LE assist for all bed mobility. Eyes closed throughout and did not follow commands to open eyes. Max A to maintain sitting balance.    Transfers                 General transfer comment: Unsafe to attempt  Ambulation/Gait                Stairs             Wheelchair Mobility    Modified Rankin (Stroke Patients Only) Modified Rankin (Stroke Patients Only) Pre-Morbid Rankin Score: No significant disability Modified Rankin: Severe disability     Balance Overall balance assessment: Needs assistance Sitting-balance support: No upper extremity supported;Feet supported Sitting balance-Leahy Scale: Zero Sitting balance - Comments: max A to maintain sitting balance                                     Pertinent Vitals/Pain Pain Assessment: Faces Faces Pain Scale: No hurt    Home Living Family/patient expects to be discharged to:: Private residence Living Arrangements: Spouse/significant other Available Help at Discharge: Family;Available 24 hours/day Type of Home: House Home Access: Stairs to enter Entrance Stairs-Rails: Right Entrance Stairs-Number of Steps: 2 Home Layout: One level Home Equipment: Cane - single point;Other (comment) (upright walker) Additional Comments: INformation from previous notes as pt unable to report    Prior Function           Comments: Per previous notes, pt was independent.     Hand Dominance        Extremity/Trunk Assessment   Upper Extremity Assessment Upper Extremity Assessment: Defer to OT evaluation (Per notes, L hand weakness at baseline)    Lower Extremity Assessment Lower Extremity Assessment: Generalized weakness;Difficult to assess due to impaired cognition (spontaneous movement noted bilaterally)    Cervical / Trunk Assessment Cervical / Trunk Assessment: Kyphotic  Communication   Communication:  Expressive difficulties  Cognition Arousal/Alertness: Lethargic Behavior During Therapy: Flat affect Overall Cognitive Status: Difficult to assess                                 General Comments: Pt keeping eyes closed throughout. Spontaneous movement of BUE and BLE. Did grimace to painful stimuli. ? command following as pt bent R knee when asked  X2. However, that seemed to be the only command that was followed. Eyes closed throughout.      General Comments General comments (skin integrity, edema, etc.): No family present    Exercises     Assessment/Plan    PT Assessment Patient needs continued PT services  PT Problem List Decreased strength;Decreased activity tolerance;Decreased balance;Decreased mobility;Decreased cognition;Decreased knowledge of use of DME;Decreased safety awareness;Decreased knowledge of precautions       PT Treatment Interventions DME instruction;Gait training;Functional mobility training;Therapeutic activities;Therapeutic exercise;Balance training;Patient/family education    PT Goals (Current goals can be found in the Care Plan section)  Acute Rehab PT Goals PT Goal Formulation: Patient unable to participate in goal setting Time For Goal Achievement: 04/04/20 Potential to Achieve Goals: Fair    Frequency Min 3X/week   Barriers to discharge        Co-evaluation               AM-PAC PT "6 Clicks" Mobility  Outcome Measure Help needed turning from your back to your side while in a flat bed without using bedrails?: Total Help needed moving from lying on your back to sitting on the side of a flat bed without using bedrails?: Total Help needed moving to and from a bed to a chair (including a wheelchair)?: Total Help needed standing up from a chair using your arms (e.g., wheelchair or bedside chair)?: Total Help needed to walk in hospital room?: Total Help needed climbing 3-5 steps with a railing? : Total 6 Click Score: 6    End of Session   Activity Tolerance: Patient limited by lethargy Patient left: in bed;with call bell/phone within reach;with bed alarm set;Other (comment) (nursing student present) Nurse Communication: Mobility status PT Visit Diagnosis: Other symptoms and signs involving the nervous system (R29.898);Difficulty in walking, not elsewhere classified (R26.2)    Time:  5102-5852 PT Time Calculation (min) (ACUTE ONLY): 13 min   Charges:   PT Evaluation $PT Eval Moderate Complexity: 1 Mod          Farley Ly, PT, DPT  Acute Rehabilitation Services  Pager: [phone] Office: [phone]   Lehman Prom 03/21/2020, 9:47 AM

## 2020-03-21 NOTE — Progress Notes (Signed)
SLP Cancellation Note  Patient Details Name: Hannah Fischer MRN: [id number] DOB: May 22, 1932   Cancelled treatment:       Reason Eval/Treat Not Completed: Fatigue/lethargy limiting ability to participate; mentation remains barrier for PO readiness. Will continue to monitor and follow for goals of care.    Ardyth Gal MA, CCC-SLP Acute Rehabilitation Services   03/21/2020, 12:58 PM

## 2020-03-21 NOTE — Progress Notes (Signed)
CCMD notified nurse that patient was showing ST elevation in two leads. MD notified, DR. Akula ordered an EKG

## 2020-03-21 NOTE — Progress Notes (Addendum)
PROGRESS NOTE    Hannah Fischer  WIO:[id number] DOB: Oct 03, 1932 DOA: 03/18/2020 PCP: Sigmund Hazel, MD    Chief Complaint  Patient presents with  . Code Stroke    Brief Narrative:  87 year old lady with prior history of hypertension, hyperlipidemia, type 2 diabetes, embolic strokes last in October 2021 with residual left hand weakness, had Covid infection in January 2020, pulmonary embolus, hypothyroidism, GERD, hyperlipidemia presents to ED for altered mental status. She came in from her was a code stroke, suspicious for LVO. CT was negative for LVO. PCCM admitted for airway protection. MRI of the brain shows bilateral thalamic strokes. From PCCM to [facility] on 2/15 2022. Patient continues to be encephalopathic, failure to follow commands and participate in physical therapy, overall poor prognosis. Therapy evaluations recommending SNF. In view of her age,  bilateral thalamic infarcts, poor progression, unable to participate with SLP, poor prognosis, palliative care consulted for goals of care.  Patient seen and examined She remains hemodynamically stable, maintaining her airway but minimally responsive to verbal cues opens eyes briefly to sternal rub. She has core track for nutrition and a Foley catheter.   Assessment & Plan:   Active Problems:   Acute encephalopathy  Bilateral thalamic infarcts extending back to the midbrain in the setting of prior cerebral embolic infarcts with residual left-sided weakness Poor prognosis Neurology on board recommending to start stimulants to see if she would improve.  Permissive hypertension.  Aspirin as per neurology.  Unable to participate with SLP.  She has a core track placed for nutrition and medications on 2/14 2022 Therapy evaluations recommending SNF.   Acute toxic metabolic encephalopathy probably secondary to bilateral thalamic infarcts in the setting of old infarcts, AKI It appears there is no change in her mental status when compared to  yesterday. She is requiring maximum assistance with PT and no participation with SLP. No family at bedside, opening eyes with sternal rub only. EEG shows moderate diffuse encephalopathy, nonspecific etiology. No seizures or epileptiform discharges were seen throughout the recording.   Diabetes mellitus type 2 with hyperglycemia CBG (last 3)  Recent Labs    03/21/20 0032 03/21/20 0434 03/21/20 0728  GLUCAP 201* 179* 155*   Continue with sliding scale insulin at this time.   Hypothyroidism Continue with Synthroid via core track   AKI probably secondary to dehydration from poor oral intake and also from urinary retention.  Foley catheter was placed Recheck renal parameters in the morning Continue with gentle hydration  History of chronic pyuria DC antibiotics   Hyperlipidemia New with Crestor   DVT prophylaxis: Heparin.  Code Status: (Full code) Family Communication: none at bedside.  Disposition:   Status is: Inpatient  Remains inpatient appropriate because:Ongoing diagnostic testing needed not appropriate for outpatient work up and Unsafe d/c plan   Dispo: The patient is from: Home              Anticipated d/c is to: SNF              Anticipated d/c date is: 2 days              Patient currently is not medically stable to d/c.   Difficult to place patient No       Consultants:   Neurology.   Palliative care.    Procedures:  2/12 CTH >> No evidence of acute intracranial abnormality.  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  Redemonstrated chronic encephalomalacia within the anterior right temporal lobe.  Stable background mild-to-moderate cerebral atrophy and chronic small vessel ischemic disease.  2/12 CTA head/ neck/ perfusion >> CTA neck: 1. Significantly limited evaluation due to motion degradation and suboptimal contrast timing. 2. The common and internal carotid arteries are patent within the neck.  Moderate/severe calcified plaque within the right carotid bifurcation and proximal ICA. Quantification of stenosis at this site is not possible due to the degree of motion degradation. Calcified plaque within the left carotid bifurcation and proximal ICA with estimated up to 60% stenosis of the proximal left ICA. 3. Streak and beam hardening artifact precludes adequate evaluation of the proximal vertebral arteries. Within this limitation, the vertebral arteries are patent within the neck. However, there is gradually diminishing enhancement within the mid to distal cervical right vertebral artery and the intracranial right vertebral artery now appears highly stenosed or occluded (however, this could be artifactual in the setting of suboptimal contrast timing).  CTA head: 1. Limited evaluation due to suboptimal contrast timing. 2. Paucity of enhancement within the non dominant intracranial right vertebral artery. This vessel may now be highly stenosed or occluded (however, this could be artifactual in the setting of suboptimal contrast timing). 3. Moderate/severe stenosis within a mid M2 left MCA branch vessel.  4. Elsewhere, no intracranial large vessel occlusion or proximal high-grade arterial stenosis is identified. 5. Prominent calcified plaque within the intracranial ICAs bilaterally with suspected moderate bilateral stenosis.  CT perfusion head: The perfusion software identifies no core infarct. The perfusion software identifies no critically hypoperfused parenchyma utilizing the Tmax>6 seconds threshold.  2/13 MR brain >> 1. Punctate acute infarction in the left superior cerebellum adjacent to the superior cerebellar peduncle. Bilateral thalamic acute infarction extending back into the mid brain. This is consistent with artery of Percheron infarction. No hemorrhage or mass effect. 2. Old infarction at the left parietooccipital junction. 3. Chronic small-vessel ischemic changes elsewhere  throughout the brain.   Antimicrobials: none.    Subjective: Minimally responsive to painful stimuli.  Objective: Vitals:   03/20/20 2241 03/21/20 0000 03/21/20 0321 03/21/20 0730  BP: (!) 173/82 (!) 156/71 (!) 166/85 (!) 159/60  Pulse: 93 89 83 80  Resp: Temp: 99.3 F (37.4 C) 99.3 F (37.4 C) 98.6 F (37 C) 98.7 F (37.1 C)  TempSrc: Oral Axillary Oral Axillary  SpO2: 95% 95% 98%   Weight:      Height:        Intake/Output Summary (Last 24 hours) at 03/21/2020 1104 Last data filed at 03/21/2020 0735 Gross per 24 hour  Intake 942.68 ml  Output 740 ml  Net 202.68 ml   Filed Weights   03/18/20 1834 03/19/20 0030 03/19/20 0400  Weight: 78.5 kg 80.2 kg 80.2 kg    Examination:  General exam: Elderly woman, with cortrak not in distress.  Respiratory system: Clear to auscultation. Respiratory effort normal. Cardiovascular system: S1 & S2 heard, RRR Gastrointestinal system: Abdomen is soft, bowel sounds wnl.  Central nervous system: Minimally responding to painful stimuli. Eyes closed all the time. Spontaneous moving lower extremities.  Extremities: no pedal edema.  Skin: No rashes seen.  Psychiatry: cannot be assessed.     Data Reviewed: I have personally reviewed following labs and imaging studies  CBC: Recent Labs  Lab 03/18/20 1420 03/18/20 1719 03/18/20 1932 03/19/20 1128 03/20/20 0504 03/21/20 0534  WBC 3.8*  --   --  7.2 6.2 5.6  NEUTROABS 1.9  --   --   --   --   --  HGB 11.8* 12.9 12.2 12.8 13.4 13.0  HCT 37.7 38.0 36.0 39.8 41.5 38.8  MCV 101.9*  --   --  98.8 100.0 99.0  PLT 182  --   --  213 186 187    Basic Metabolic Panel: Recent Labs  Lab 03/18/20 1417 03/18/20 1420 03/18/20 1719 03/18/20 1932 03/18/20 1936 03/19/20 1128 03/20/20 0504 03/20/20 1640 03/21/20 0534  NA 139 139 138 139  --  140 141  --  141  K 4.2 4.4 4.5 3.9  --  4.1 4.2  --  3.9  CL 101 101  --   --   --  104 104  --  105  CO2  --  27  --   --    --  26 25  --  25  GLUCOSE 172* 176*  --   --   --  121* 144*  --  191*  BUN 26* 24*  --   --   --  23 26*  --  27*  CREATININE 1.00 1.10*  --   --   --  1.29* 1.72*  --  1.75*  CALCIUM  --  8.8*  --   --   --  9.1 8.8*  --  8.9  MG  --   --   --   --  1.7  --   --  2.1 2.0  PHOS  --   --   --   --  3.6  --   --  3.9 3.9    GFR: Estimated Creatinine Clearance: 23.6 mL/min (A) (by C-G formula based on SCr of 1.75 mg/dL (H)).  Liver Function Tests: Recent Labs  Lab 03/18/20 1420  AST 20  ALT 11  ALKPHOS 56  BILITOT 0.6  PROT 5.5*  ALBUMIN 2.9*    CBG: Recent Labs  Lab 03/20/20 1523 03/20/20 1932 03/21/20 0032 03/21/20 0434 03/21/20 0728  GLUCAP 125* 144* 201* 179* 155*     Recent Results (from the past 240 hour(s))  Resp Panel by RT-PCR (Flu A&B, Covid) Nasopharyngeal Swab     Status: None   Collection Time: 03/18/20  3:10 PM   Specimen: Nasopharyngeal Swab; Nasopharyngeal(NP) swabs in vial transport medium  Result Value Ref Range Status   SARS Coronavirus 2 by RT PCR NEGATIVE NEGATIVE Final    Comment: (NOTE) SARS-CoV-2 target nucleic acids are NOT DETECTED.  The SARS-CoV-2 RNA is generally detectable in upper respiratory specimens during the acute phase of infection. The lowest concentration of SARS-CoV-2 viral copies this assay can detect is 138 copies/mL. A negative result does not preclude SARS-Cov-2 infection and should not be used as the sole basis for treatment or other patient management decisions. A negative result may occur with  improper specimen collection/handling, submission of specimen other than nasopharyngeal swab, presence of viral mutation(s) within the areas targeted by this assay, and inadequate number of viral copies(<138 copies/mL). A negative result must be combined with clinical observations, patient history, and epidemiological information. The expected result is Negative.  Fact Sheet for Patients:   BloggerCourse.com  Fact Sheet for Healthcare Providers:  SeriousBroker.it  This test is no t yet approved or cleared by the [place] FDA and  has been authorized for detection and/or diagnosis of SARS-CoV-2 by FDA under an Emergency Use Authorization (EUA). This EUA will remain  in effect (meaning this test can be used) for the duration of the COVID-19 declaration under Section 564(b)(1) of the Act, 21 U.S.C.section 360bbb-3(b)(1), unless the  authorization is terminated  or revoked sooner.       Influenza A by PCR NEGATIVE NEGATIVE Final   Influenza B by PCR NEGATIVE NEGATIVE Final    Comment: (NOTE) The Xpert Xpress SARS-CoV-2/FLU/RSV plus assay is intended as an aid in the diagnosis of influenza from Nasopharyngeal swab specimens and should not be used as a sole basis for treatment. Nasal washings and aspirates are unacceptable for Xpert Xpress SARS-CoV-2/FLU/RSV testing.  Fact Sheet for Patients: BloggerCourse.com  Fact Sheet for Healthcare Providers: SeriousBroker.it  This test is not yet approved or cleared by the [place] FDA and has been authorized for detection and/or diagnosis of SARS-CoV-2 by FDA under an Emergency Use Authorization (EUA). This EUA will remain in effect (meaning this test can be used) for the duration of the COVID-19 declaration under Section 564(b)(1) of the Act, 21 U.S.C. section 360bbb-3(b)(1), unless the authorization is terminated or revoked.  Performed at [facility], [address] [place], [place] [address]   Blood culture (routine x 2)     Status: None (Preliminary result)   Collection Time: 03/18/20  3:11 PM   Specimen: BLOOD  Result Value Ref Range Status   Specimen Description BLOOD SITE NOT SPECIFIED  Final   Special Requests   Final    BOTTLES DRAWN AEROBIC AND ANAEROBIC Blood Culture results may not be optimal  due to an inadequate volume of blood received in culture bottles   Culture   Final    NO GROWTH 2 DAYS Performed at [facility], [address] [place], [place] [address]    Report Status PENDING  Incomplete  Blood culture (routine x 2)     Status: None (Preliminary result)   Collection Time: 03/18/20  3:11 PM   Specimen: BLOOD  Result Value Ref Range Status   Specimen Description BLOOD SITE NOT SPECIFIED  Final   Special Requests   Final    BOTTLES DRAWN AEROBIC AND ANAEROBIC Blood Culture results may not be optimal due to an inadequate volume of blood received in culture bottles   Culture   Final    NO GROWTH 2 DAYS Performed at [facility], [address], [place], [place] [address]    Report Status PENDING  Incomplete  Culture, Urine     Status: Abnormal   Collection Time: 03/18/20  5:59 PM   Specimen: Urine, Catheterized  Result Value Ref Range Status   Specimen Description URINE, CATHETERIZED  Final   Special Requests Normal  Final   Culture (A)  Final    50,000 COLONIES/mL GROUP B STREP(S.AGALACTIAE)ISOLATED TESTING AGAINST S. AGALACTIAE NOT ROUTINELY PERFORMED DUE TO PREDICTABILITY OF AMP/PEN/VAN SUSCEPTIBILITY. Performed at [facility], [address], [place], [place] [address]    Report Status 03/19/2020 FINAL  Final  MRSA PCR Screening     Status: Abnormal   Collection Time: 03/18/20  6:35 PM   Specimen: Nasal Mucosa; Nasopharyngeal  Result Value Ref Range Status   MRSA by PCR POSITIVE (A) NEGATIVE Final    Comment:        The GeneXpert MRSA Assay (FDA approved for NASAL specimens only), is one component of a comprehensive MRSA colonization surveillance program. It is not intended to diagnose MRSA infection nor to guide or monitor treatment for MRSA infections. RESULT CALLED TO, READ BACK BY AND VERIFIED WITH: [facility] RN AT 2041 03/18/2020 MITCHELL,L Performed at [facility], [address], [place], [place] [address]  Radiology Studies: EEG adult  Result Date: 03/19/2020 Charlsie QuestYadav, Priyanka O, MD     03/19/2020  2:12 PM Patient Name: Marquette OldJean G Strout MRN: [id number] Epilepsy Attending: Charlsie QuestPriyanka O Yadav Referring Physician/Provider: Dr Wallis Martobert Stretch Date: 03/19/2020 Duration: 24.36 mins Patient history: 87 year old Caucasian lady with sudden onset of unresponsiveness and altered mental status. EEG to evaluate for seizure Level of alertness: lethargic AEDs during EEG study: None Technical aspects: This EEG study was done with scalp electrodes positioned according to the 10-20 International system of electrode placement. Electrical activity was acquired at a sampling rate of 500Hz  and reviewed with a high frequency filter of 70Hz  and a low frequency filter of 1Hz . EEG data were recorded continuously and digitally stored. Description: No posterior dominant rhythm was seen. EEG showed continuous generalized 3 to 6 Hz theta-delta slowing.  Hyperventilation and photic stimulation were not performed.   ABNORMALITY -Continuous slow, generalized IMPRESSION: This study is suggestive of moderate diffuse encephalopathy, nonspecific etiology. No seizures or epileptiform discharges were seen throughout the recording. Priyanka Annabelle Harman Yadav        Scheduled Meds: . amantadine  50 mg Per Tube BID  . aspirin  81 mg Oral Daily   Or  . aspirin  300 mg Rectal Daily  . chlorhexidine  15 mL Mouth Rinse BID  . Chlorhexidine Gluconate Cloth  6 each Topical Daily  . feeding supplement (PROSource TF)  45 mL Per Tube TID  . heparin  5,000 Units Subcutaneous Q8H  . insulin aspart  0-9 Units Subcutaneous Q4H  . mouth rinse  15 mL Mouth Rinse q12n4p  . mupirocin ointment  1 application Nasal BID  . sodium chloride flush  3 mL Intravenous Once   Continuous Infusions: . sodium chloride 10 mL/hr at 03/19/20 0500  . feeding supplement (OSMOLITE 1.2 CAL) 1,000 mL (03/20/20 1537)     LOS: 3 days        Kathlen ModyVijaya Amellia Panik, MD [facility]   To contact the attending provider between 7A-7P or the covering provider during after hours 7P-7A, please log into the web site [email or web address] and access using universal [place] password for that web site. If you do not have the password, please call the hospital operator.  03/21/2020, 11:04 AM

## 2020-03-21 NOTE — Consult Note (Addendum)
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  Palliative care was consulted to aid in goals of care conversations.  Clinical Assessment/Goals of Care:  *Please note that this is a verbal dictation therefore any spelling or grammatical errors are due to the "Dragon Medical One" system interpretation.  I have reviewed medical records including EPIC notes, labs and imaging, received report from bedside RN, assessed the patient who is unable to open her eyes though will follow some commands like "squeeze my hand".    I called [name] Racz to further discuss diagnosis prognosis, GOC, EOL wishes, disposition and options.   I introduced Palliative Medicine as specialized medical care for people living with serious illness. It focuses on providing relief from the symptoms and stress of a serious illness. The goal is to improve quality of life for both the patient and the family.  [name] shares with me that his wife is from [place], [place]. They have been married for over sixty-five years. They had two children, a daughter Babette Relic who lives in [place] and a son, Aurther Loft who lives in [place]. Tya use to work for [employer] and later retired from [employer]. Berniece is a religious woman and practices within the [employer].  Prior to hospitalization Jurnee was fully independent of all bADL's. [name] shares how hard this is for him being that she is noew full dependent and her future is unclear.  A detailed discussion was had today regarding  advanced directives - patient has never completed these though her husband, Bonnee Quin makes decisions for her.   Concepts specific to code status, artifical feeding and hydration, continued IV antibiotics and rehospitalization was had.  We discussed Meleah's present condition and how performing resuscitation could result in traumatic consequences with little to now long term benefit. He understood this and does agree that DNAR/DNI code status is appropriate for her being all that she has been through. We reviewed the MOST form and have agreed to complete this in person once he has had the time to review it more formally.   The difference between a aggressive medical intervention path  and a palliative comfort care path for this patient at this time was had. We reviewed the strokes that Pernell has already suffered and the hope for recovery but possibility of little improvement.We reviewed that improvements may take time and [name] would at this point like to wait to see if she may come around.  Discussed the importance of continued conversation with family and their  medical providers regarding overall plan of care and treatment options, ensuring decisions are within the context of the patients values and GOCs.  Provided "Hard Choices for [employer]" booklet.   Decision Maker: Emmalyn Hinson (Spouse) [phone]  SUMMARY OF RECOMMENDATIONS   DNAR/DNI  Time trial of treatments to see if improvements can be made - PMT will meet with husband on Thursday  Ongoing PMT support in the oncoming days  Code Status/Advance Care Planning: DNAR/DNI  Palliative Prophylaxis:   Oral Care  Additional Recommendations (Limitations, Scope, Preferences):  Continue current scope of care    Psycho-social/Spiritual:   Desire  for further Chaplaincy support: Yes  Additional Recommendations: Education on multiple strokes and long term outlook   Prognosis: Poor in the setting of bilateral medial thalamic  and punctate left superior cerebellar infarcts.   Discharge Planning: Discharge plan to be determined  Vitals:   03/21/20 0321 03/21/20 0730  BP: (!) 166/85 (!) 159/60  Pulse: 83 80  Resp: 18 18  Temp: 98.6 F (37 C) 98.7 F (37.1 C)  SpO2: 98%     Intake/Output Summary (Last 24 hours) at 03/21/2020 1326 Last data filed at 03/21/2020 1100 Gross per 24 hour  Intake 1067.54 ml  Output 390 ml  Net 677.54 ml   Last Weight  Most recent update: 03/19/2020  4:01 AM   Weight  80.2 kg (176 lb 12.9 oz)           Gen:  Elderly F in NAD HEENT:Coretrack in place, moist mucous membranes CV: Regular rate and rhythm  PULM: 1LPM [place] ABD: soft/nontender  EXT: No edema  Neuro:  Somnolent can follow basic commands though does not open eyes or speak  PPS: 10%   This conversation/these recommendations were discussed with patient primary care team, Dr. Blake Divine  Time In: 1230 Time Out: 1340 Total Time: 70 Greater than 50%  of this time was spent counseling and coordinating care related to the above assessment and plan.  Lamarr Lulas [place] Palliative Medicine Team Team Cell Phone: [phone] Please utilize secure chat with additional questions, if there is no response within 30 minutes please call the above phone number  Palliative Medicine Team providers are available by phone from 7am to 7pm daily and can be reached through the team cell phone.  Should this patient require assistance outside of these hours, please call the patient's attending physician.

## 2020-03-21 NOTE — Progress Notes (Signed)
Stroke Team Progress Note  SUBJECTIVE Patient has remains sleepy and hard to arouse and can but her only few nonsensical words but is localizing moving all 4 extremities against gravity but not to commands.  The nurse felt she squeeze the fingers and wiggle her toes for balance earlier today..  She remains hemodynamically stable and is maintaining her airway so far.  Serum creatinine is increased to 1.75 .husband and pastor at the bedside and updated them.  Plan to check MR venogram will evaluate the deep cerebral venous system.   OBJECTIVE Most recent Vital Signs: Temp: 98.7 F (37.1 C) (02/15 0730) Temp Source: Axillary (02/15 0730) BP: 159/60 (02/15 0730) Pulse Rate: 80 (02/15 0730) Respiratory Rate: 18 O2 Saturdation: 98%  CBG (last 3)  Recent Labs    03/21/20 0434 03/21/20 0728 03/21/20 1226  GLUCAP 179* 155* 187*       Studies:  CT head no acute abnormalities MRI bilateral medial thalamic punctate left superior cerebellar infarct CTA suboptimal due to contrast timing but no LVO.  Diminutive flow in the right vertebral artery ECHO on 11/24/2019 ejection fraction 6065%.  No wall motion abnormalities. LDL 155 mg percent HbA1c 7.6 EEG pending  Physical Exam:     Frail elderly Caucasian lady is lying comfortably in bed.  Not in distress. . Afebrile. Head is nontraumatic. Neck is supple without bruit.    Cardiac exam no murmur or gallop. Lungs are clear to auscultation. Distal pulses are well felt. Neurological Exam :  Patient is sleepy.  She responds to sternal rub with partially opening her eyes and muttering in few words but not able to speak sentences.  She was able to stick out her tongue and move her extremities to command. She moves right upper and both lower extremities purposefully there is movement against gravity in left upper extremity but appears to have some weakness.  Slight dysconjugate gaze with both eyes deviated downwards at rest with left eye hypertropia both  pupils are 3 to 4 mm sluggishly reactive.  Corneal reflexes are brisk Doll's eye movements are sluggish.  She does not blink to threat on either side.  Face appears symmetric.  Plantars are both upgoing.  Gait not tested ASSESSMENT [name] is a 87 y.o. 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  Tiny left cerebellar punctate infarct may also be a concurrent lacune.  However she has had previous history of embolic as well as small vessel infarcts in October 21 hence strong suspicion for underlying paroxysmal A. fib.  Hospital day # 3  TREATMENT/PLAN Patient neurological exam is minimally better.  Expect slow improvement over the next few days.  Patient is unlikely to be able to swallow it and she is more awake and agree with core track tube placement and starting nutrition.  Increase amantadine dose to 100 mg twice daily to improve wakefulness.  Check MR venogram of the brain to look at the deep cerebral veins to rule out deep cerebral venous sinus stenosis.  Discussed with patient's husband at the bedside along with the chaplain and answered questions.  Discussed with Dr. Blake Divine.  Greater than 50% time during the 35-minute visit was spent in counseling and coordination of care about her right thalamic infarct and decreases  forcefulness discussion of treatment and answering questions.  Delia Heady, MD Medical Director [facility] Stroke Center Pager: [phone] 03/21/2020 12:55 PM

## 2020-03-22 DIAGNOSIS — Z8673 Personal history of transient ischemic attack (TIA), and cerebral infarction without residual deficits: Secondary | ICD-10-CM

## 2020-03-22 DIAGNOSIS — Z515 Encounter for palliative care: Secondary | ICD-10-CM | POA: Diagnosis not present

## 2020-03-22 DIAGNOSIS — G9340 Encephalopathy, unspecified: Secondary | ICD-10-CM | POA: Diagnosis not present

## 2020-03-22 LAB — GLUCOSE, CAPILLARY
Glucose-Capillary: 166 mg/dL — ABNORMAL HIGH (ref 70–99)
Glucose-Capillary: 169 mg/dL — ABNORMAL HIGH (ref 70–99)
Glucose-Capillary: 174 mg/dL — ABNORMAL HIGH (ref 70–99)
Glucose-Capillary: 182 mg/dL — ABNORMAL HIGH (ref 70–99)
Glucose-Capillary: 204 mg/dL — ABNORMAL HIGH (ref 70–99)
Glucose-Capillary: 213 mg/dL — ABNORMAL HIGH (ref 70–99)
Glucose-Capillary: 223 mg/dL — ABNORMAL HIGH (ref 70–99)

## 2020-03-22 LAB — BASIC METABOLIC PANEL
Anion gap: 9 (ref 5–15)
BUN: 32 mg/dL — ABNORMAL HIGH (ref 8–23)
CO2: 27 mmol/L (ref 22–32)
Calcium: 8.9 mg/dL (ref 8.9–10.3)
Chloride: 107 mmol/L (ref 98–111)
Creatinine, Ser: 1.85 mg/dL — ABNORMAL HIGH (ref 0.44–1.00)
GFR, Estimated: 26 mL/min — ABNORMAL LOW (ref >60)
Glucose, Bld: 193 mg/dL — ABNORMAL HIGH (ref 70–99)
Potassium: 4.4 mmol/L (ref 3.5–5.1)
Sodium: 143 mmol/L (ref 135–145)

## 2020-03-22 LAB — MAGNESIUM: Magnesium: 2.2 mg/dL (ref 1.7–2.4)

## 2020-03-22 LAB — PHOSPHORUS: Phosphorus: 3.9 mg/dL (ref 2.5–4.6)

## 2020-03-22 MED ORDER — METHYLPHENIDATE HCL 5 MG PO TABS: 5.0000 mg | ORAL_TABLET | Freq: Two times a day (BID) | ORAL | Status: DC

## 2020-03-22 MED ORDER — METHYLPHENIDATE HCL 5 MG PO TABS
5.0000 mg | ORAL_TABLET | Freq: Two times a day (BID) | ORAL | Status: DC
  Administered 2020-03-23 – 2020-03-25 (×6): 5 mg
  Filled 2020-03-22 (×6): qty 1

## 2020-03-22 MED ORDER — FREE WATER
200.0000 mL | Freq: Four times a day (QID) | Status: DC
  Administered 2020-03-22 – 2020-03-23 (×3): 200 mL

## 2020-03-22 NOTE — Evaluation (Signed)
Clinical/Bedside Swallow Evaluation Patient Details  Name: Hannah Fischer MRN: [id number] Date of Birth: [birth year]  Today's Date: 03/22/2020 Time: SLP Start Time (ACUTE ONLY): 1200 SLP Stop Time (ACUTE ONLY): 1220 SLP Time Calculation (min) (ACUTE ONLY): 20 min  Past Medical History:  Past Medical History:  Diagnosis Date  . Abnormal nuclear stress test    2005, normal cath Dr. Elease Hashimoto, normal stress test 2012 Dr. Eldridge Dace  . Anemia    chronic  . Anxiety   . Arthritis    djd  . Arthritis of shoulder region, left, degenerative 12/03/2013  . Blood transfusion 1979   for anemia  . Chest pain    evaluated @ [facility] cardiology; had a stress test 2012  . Chronic pain syndrome 04/29/2019  . Chronic right-sided low back pain without sciatica 04/12/2019  . Complication of anesthesia    unable to breathe lying  flat on back  . Diabetes mellitus    type 2  niddm x 25 yrs  . Diabetes mellitus (HCC) 11/15/2011  . Diverticular disease   . DJD (degenerative joint disease)    lumbar and cervical  . GERD (gastroesophageal reflux disease)   . Hyperlipemia   . Hypertension    on medication  . Hypothyroidism    takes levoxyl  . Irritable bowel syndrome   . 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 Neuromuscular disorder (HCC)   . Neuropathy in diabetes (HCC)   . Nonspecific abnormal electrocardiogram (ECG) (EKG) 10/15/2013  . Other and unspecified angina pectoris    tests came back NOT heart related  . Pulmonary embolism (HCC) 06-2010   bilateral  . Pyuria 11/15/2011  . Seizures (HCC)    once, age 4  . TIA (transient ischemic attack) 11/15/2011   Past Surgical History:  Past Surgical History:  Procedure Laterality Date  . BACK SURGERY    . BLEPHAROPLASTY  2012   rt eye  . CARDIAC CATHETERIZATION     had one 10/2013  . CHOLECYSTECTOMY    . EYE SURGERY  2007   cataract ext/ iol rt eye  . JOINT REPLACEMENT  2001,2005   both knees  . LEFT HEART CATHETERIZATION WITH  CORONARY ANGIOGRAM N/A 10/18/2013   Procedure: LEFT HEART CATHETERIZATION WITH CORONARY ANGIOGRAM;  Surgeon: Corky Crafts, MD;  Location: [facility] CATH LAB;  Service: Cardiovascular;  Laterality: N/A;  . LUMBAR DISC SURGERY  1990  . REVERSE SHOULDER ARTHROPLASTY Left 12/03/2013   Procedure: LEFT SHOULDER REVERSE ARTHROPLASTY;  Surgeon: Verlee Rossetti, MD;  Location: [facility] OR;  Service: Orthopedics;  Laterality: Left;  . SHOULDER HEMI-ARTHROPLASTY  12/25/2010   Procedure: SHOULDER HEMI-ARTHROPLASTY;  Surgeon: Cammy Copa;  Location: [facility] OR;  Service: Orthopedics;  Laterality: Right;   HPI:  [name] is a 87 y.o. female with bilateral medial thalamic and punctate left superior cerebellar infarcts   Assessment / Plan / Recommendation Clinical Impression  Pt presents with eyes closed through session, but ability to respond to PO with hand over hand assist, tactile cues such as touching PO to lips and verbal cues to open mouth wider etc. Her oral manipulation of sips from a cup edge with honey thick liquids was quite appropriate despite decreased awareness. There was delayed coughing though pt did have a consistent swallow response. Suspect that if arousal were to improve significantly pts prognosis for ability to swallow would be quite good. Will f/u to provide opportunities to achieve adequate arousal and attention to  PO. SLP Visit Diagnosis: Dysphagia, oropharyngeal phase (R13.12)    Aspiration Risk  Moderate aspiration risk;Risk for inadequate nutrition/hydration    Diet Recommendation NPO        Other  Recommendations     Follow up Recommendations Skilled Nursing facility      Frequency and Duration min 2x/week  2 weeks       Prognosis Prognosis for Safe Diet Advancement: Good Barriers to Reach Goals: Severity of deficits      Swallow Study   General HPI: [name] is a 87 y.o. female with bilateral medial thalamic and punctate left superior cerebellar  infarcts Type of Study: Bedside Swallow Evaluation Previous Swallow Assessment: none but husband reports some difficulty swallowing pills prior Diet Prior to this Study: NPO Temperature Spikes Noted: No Respiratory Status: Room air History of Recent Intubation: No Behavior/Cognition: Lethargic/Drowsy;Requires cueing Oral Cavity Assessment: Within Functional Limits Oral Care Completed by SLP: No Oral Cavity - Dentition: Edentulous;Dentures, not available Vision: Impaired for self-feeding Self-Feeding Abilities: Total assist Patient Positioning: Upright in bed Baseline Vocal Quality: Low vocal intensity Volitional Cough: Cognitively unable to elicit Volitional Swallow: Unable to elicit    Oral/Motor/Sensory Function Overall Oral Motor/Sensory Function: Within functional limits   Ice Chips Ice chips: Impaired Presentation: Spoon Oral Phase Impairments: Poor awareness of bolus   Thin Liquid Thin Liquid: Impaired Presentation: Straw Oral Phase Impairments: Poor awareness of bolus Pharyngeal  Phase Impairments: Cough - Delayed    Nectar Thick Nectar Thick Liquid: Not tested   Honey Thick Honey Thick Liquid: Impaired Presentation: Cup Oral Phase Impairments: Poor awareness of bolus Pharyngeal Phase Impairments: Cough - Delayed   Puree Puree: Impaired Presentation: Spoon Oral Phase Impairments: Poor awareness of bolus   Solid     Solid: Not tested     Harlon Ditty, MA CCC-SLP  Acute Rehabilitation Services Pager [phone] Office [phone]  Claudine Mouton 03/22/2020,1:40 PM

## 2020-03-22 NOTE — Progress Notes (Signed)
Stroke Team Progress Note  SUBJECTIVE Patient   remains sleepy but can be aroused with tactile and verbal stimuli but is unable to follow commands or carry on a conversation just makes guttural sounds.  She localizes moves all 4 extremities well against gravity.  Vital signs are stable except low-grade temperature 99.1.  BMP today shows increasing creatinine 1.85.  Admission and phosphorus are normal.  He is on amantadine 100 mg twice daily but has not been effective.  Will try Ritalin.  MR neurogram yesterday does not show any obstruction of the deep cerebral venous system shows a small nonocclusive thrombus in transverse sinuses which cannot explain thalamic infarcts. OBJECTIVE Most recent Vital Signs: Temp: 99.1 F (37.3 C) (02/16 1153) Temp Source: Oral (02/16 1153) BP: 131/74 (02/16 1153) Pulse Rate: 86 (02/16 1153) Respiratory Rate: 19 O2 Saturdation: 94%  CBG (last 3)  Recent Labs    03/22/20 0437 03/22/20 0800 03/22/20 1156  GLUCAP 213* 223* 169*       Studies:  CT head no acute abnormalities MRI bilateral medial thalamic punctate left superior cerebellar infarct CTA suboptimal due to contrast timing but no LVO.  Diminutive flow in the right vertebral artery ECHO on 11/24/2019 ejection fraction 6065%.  No wall motion abnormalities. LDL 155 mg percent HbA1c 7.6 EEG pending  Physical Exam:     Frail elderly Caucasian lady is lying comfortably in bed.  Not in distress. . Afebrile. Head is nontraumatic. Neck is supple without bruit.    Cardiac exam no murmur or gallop. Lungs are clear to auscultation. Distal pulses are well felt. Neurological Exam :  Patient remains sleepy.  She responds to sternal rub with partially opening her eyes and muttering in few words but not able to speak sentences.  She was able to stick out her tongue and move her extremities to command. She moves right upper and both lower extremities purposefully there is movement against gravity in left upper  extremity but appears to have some weakness.  Slight dysconjugate gaze with both eyes deviated downwards at rest with left eye hypertropia both pupils are 3 to 4 mm sluggishly reactive.  Corneal reflexes are brisk Doll's eye movements are sluggish.  She does not blink to threat on either side.  Face appears symmetric.  Plantars are both upgoing.  Gait not tested ASSESSMENT Ms. SHEYLI HORWITZ is a 87 y.o. 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  Tiny left cerebellar punctate infarct may also be a concurrent lacune.  However she has had previous history of embolic as well as small vessel infarcts in October 21 hence strong suspicion for underlying paroxysmal A. fib.  Hospital day # 4  TREATMENT/PLAN Patient neurological exam has not shown significant improvement despite amantadine 100 mg twice daily.  We will add Ritalin 5 mg as well.  MR venogram shows no evidence of deep cerebral venous thrombosis.  Patient prognosis appears poor and she will at high risk for developing aspiration pneumonia and other complications.  Family has agreed to DNR but would like to support her for a few more days.  Discussed with patient's husband at the bedside   and answered questions.  Discussed with Dr. Butler Denmark.  Greater than 50% time during the 25-minute visit was spent in  counseling and coordination of care about her right thalamic infarct and decreases forcefulness discussion of treatment and answering questions.  Delia Heady, MD Medical Director [facility] Stroke Center Pager: [phone] 03/22/2020 1:00 PM

## 2020-03-22 NOTE — Progress Notes (Signed)
Physical Therapy Treatment Patient Details Name: Hannah Fischer MRN: [id number] DOB: [birth year] Today's Date: 03/22/2020    History of Present Illness 87 y.o. female presenting code stroke. Imaging (+) for bilateral medial thalamic and punctate infarcts and L superior cerebellar infarcts. PMHx significant for multiple CVAs with most recent acute/subacute R frontal lobe and L parietal and occipital lobe infarcts ~3 months ago, residual L-sided hemiplegia with recent d/c from outpatient PT/OT with return to baseline level of function, TIA, DM, PE, anxiety, HTN, and HLD.    PT Comments    Pt continues to be limited by her level of arousal, not verbalizing any words or opening eyes throughout session. However, pt intermittently able to answer "yes/no" questions with head nods/shakes with good accuracy. Patient shakes head yes when asked if she was married, no when asked if she was a female, and yes when asked if her name was Hannah Fischer. Patient requiring Max A +2 to Total A +2 grossly for bed mobility and sit to stand transfers, and requires Mod A with brief periods of Min A for static sitting balance at EOB. Spontaneous movement noted in all 4 extremities but patient unable to move on command. Will continue to follow acutely. Current recommendations remain appropriate.    Follow Up Recommendations  SNF;Supervision/Assistance - 24 hour (vs CIR if pt progresses well)     Equipment Recommendations  Wheelchair cushion (measurements PT);Wheelchair (measurements PT);Hospital bed (hoyer lift with pad)    Recommendations for Other Services       Precautions / Restrictions Precautions Precautions: Fall Precaution Comments: cortrak, bilateral hand mitten restraints Restrictions Weight Bearing Restrictions: No    Mobility  Bed Mobility Overal bed mobility: Needs Assistance Bed Mobility: Supine to Sit;Sit to Supine     Supine to sit: Total assist;+2 for physical assistance Sit to supine: Total  assist;+2 for physical assistance   General bed mobility comments: Total A for all parts of bed mobility. Patient unable to assist despite max multimodal cues and palcement of L hand on bed rail.    Transfers Overall transfer level: Needs assistance Equipment used: 2 person hand held assist Transfers: Sit to/from Stand Sit to Stand: Max assist;+2 physical assistance         General transfer comment: Total A to initiate sit to stand but patient attempts to assist with maintaining static standing balance. Bilateral knee block and use of chuck pad initially. Patient unable to open eyes even with standing. Noted forward and R lateral lean. R head tilt.  Ambulation/Gait             General Gait Details: Not safe to attempt today.   Stairs             Wheelchair Mobility    Modified Rankin (Stroke Patients Only) Modified Rankin (Stroke Patients Only) Pre-Morbid Rankin Score: No significant disability Modified Rankin: Severe disability     Balance Overall balance assessment: Needs assistance Sitting-balance support: Feet supported;Single extremity supported;Bilateral upper extremity supported Sitting balance-Leahy Scale: Poor Sitting balance - Comments: Mod A grossly to maintain static sitting balance at EOB with brief periods of Min A, especially when leaning onto R elbow. Postural control: Right lateral lean (R head tilt) Standing balance support: Bilateral upper extremity supported;During functional activity Standing balance-Leahy Scale: Poor Standing balance comment: Requires Max A +2, chuck sling under buttocks, and bil knee block to maintain static standing balance.  Cognition Arousal/Alertness: Lethargic Behavior During Therapy: Flat affect Overall Cognitive Status: Difficult to assess                                 General Comments: Eyes remained closed throughout assessment despite sternal rub and  modifications to the environment to increase level of arousal. Patient able to answer yes/no questions accurately at times. Shakes head "no" when asked to wash her face.      Exercises      General Comments General comments (skin integrity, edema, etc.): Pt not following directions to kick legs or move them with cues but would move them sponatenously to noxious stimuli; SpO2 93-97% on RA during session      Pertinent Vitals/Pain Pain Assessment: Faces Faces Pain Scale: Hurts a little bit Pain Location: Generalized; with movement. Pain Descriptors / Indicators: Discomfort Pain Intervention(s): Limited activity within patient's tolerance;Monitored during session;Repositioned    Home Living Family/patient expects to be discharged to:: Private residence Living Arrangements: Spouse/significant other Available Help at Discharge: Family;Available 24 hours/day Type of Home: House Home Access: Stairs to enter Entrance Stairs-Rails: Right Home Layout: One level Home Equipment: Cane - single point;Other (comment) (upright walker) Additional Comments: INformation from previous notes as pt unable to report    Prior Function Level of Independence: Independent with assistive device(s)      Comments: Per chart review from OP OT/PT notes 02/2020.   PT Goals (current goals can now be found in the care plan section) Acute Rehab PT Goals Patient Stated Goal: Unable to state. PT Goal Formulation: Patient unable to participate in goal setting Time For Goal Achievement: 04/04/20 Potential to Achieve Goals: Fair Progress towards PT goals: Progressing toward goals (slowly)    Frequency    Min 3X/week      PT Plan Current plan remains appropriate    Co-evaluation PT/OT/SLP Co-Evaluation/Treatment: Yes Reason for Co-Treatment: Complexity of the patient's impairments (multi-system involvement);Necessary to address cognition/behavior during functional activity;For patient/therapist safety;To  address functional/ADL transfers PT goals addressed during session: Mobility/safety with mobility;Balance        AM-PAC PT "6 Clicks" Mobility   Outcome Measure  Help needed turning from your back to your side while in a flat bed without using bedrails?: Total Help needed moving from lying on your back to sitting on the side of a flat bed without using bedrails?: Total Help needed moving to and from a bed to a chair (including a wheelchair)?: Total Help needed standing up from a chair using your arms (e.g., wheelchair or bedside chair)?: Total Help needed to walk in hospital room?: Total Help needed climbing 3-5 steps with a railing? : Total 6 Click Score: 6    End of Session Equipment Utilized During Treatment: Gait belt Activity Tolerance: Patient limited by lethargy Patient left: in bed;with call bell/phone within reach;with bed alarm set;with SCD's reapplied;with restraints reapplied Nurse Communication: Mobility status PT Visit Diagnosis: Other symptoms and signs involving the nervous system (R29.898);Difficulty in walking, not elsewhere classified (R26.2);Unsteadiness on feet (R26.81);Muscle weakness (generalized) (M62.81)     Time: 6378-5885 PT Time Calculation (min) (ACUTE ONLY): 29 min  Charges:  $Therapeutic Activity: 8-22 mins                     Raymond Gurney, PT, DPT Acute Rehabilitation Services  Pager: [phone] Office: [phone]    Jewel Baize 03/22/2020, 12:49 PM

## 2020-03-22 NOTE — Evaluation (Signed)
Occupational Therapy Evaluation Patient Details Name: Hannah Fischer MRN: [id number] DOB: [birth year] Today's Date: 03/22/2020    History of Present Illness 87 y.o. female presenting code stroke. Imaging (+) for bilateral medial thalamic and punctate infarcts and L superior cerebellar infarcts. PMHx significant for multiple CVAs with most recent acute/subacute R frontal lobe and L parietal and occipital lobe infarcts ~3 months ago, residual L-sided hemiplegia with recent d/c from outpatient PT/OT with return to baseline level of function, TIA, DM, PE, anxiety, HTN, and HLD.   Clinical Impression   PTA patient was living with her spouse in a private residence and was ambulating with use of SPC. Per recent outpatient OT note, patient had achieved all goals and was functioning at baseline as of 02/24/20. Patient with decreased level of arousal throughout OT assessment with inability to confirm PLOF and home set-up. No family present at bedside. Patient intermittently able to answer "yes/no" questions with head nods/shakes with good accuracy. Patient shakes head yes when asked if she was married, no when asked if she was a female, and yes when asked if her name was Hannah Fischer. Patient currently functioning below baseline requiring Max A +2 to Total A +2 grossly for ADLs and sit to stand transfers, and requires Mod A with brief periods of Min A for static sitting balance at EOB. Spontaneous movement noted in all 4 extremities but patient unable to move on command. Patient would benefit from continued acute OT services to maximize level of arousal and increase participation with bed mobility and ADLs. Given patient CLOF and deficits listed below, recommendation for SNF rehab.     Follow Up Recommendations  SNF;Supervision/Assistance - 24 hour    Equipment Recommendations  Other (comment) (TBD)    Recommendations for Other Services       Precautions / Restrictions Precautions Precautions: Fall Precaution  Comments: cortrak, bilateral hand mitten restraints Restrictions Weight Bearing Restrictions: No      Mobility Bed Mobility Overal bed mobility: Needs Assistance Bed Mobility: Supine to Sit;Sit to Supine     Supine to sit: Total assist;+2 for physical assistance Sit to supine: Total assist;+2 for physical assistance   General bed mobility comments: Total A for all parts of bed mobility. Patient unable to assist despite max multimodal cues and palcement of L hand on bed rail.    Transfers Overall transfer level: Needs assistance Equipment used: 2 person hand held assist Transfers: Sit to/from Stand Sit to Stand: Max assist;+2 physical assistance         General transfer comment: Total A to initiate sit to stand but patient attempts to assist with maintaining static standing balance. Bilateral knee block and use of chuck pad initially. Patient unable to open eyes even with standing. Noted forward and R lateral lean. R head tilt.    Balance Overall balance assessment: Needs assistance Sitting-balance support: No upper extremity supported;Feet supported Sitting balance-Leahy Scale: Poor Sitting balance - Comments: Mod A grossly to maintain static sitting balance at EOB with brief periods of Min A. Postural control: Right lateral lean (R head tilt) Standing balance support: Bilateral upper extremity supported;During functional activity Standing balance-Leahy Scale: Poor Standing balance comment: Requires Max A +2 to maintain static standing balance.                           ADL either performed or assessed with clinical judgement   ADL Overall ADL's : Needs assistance/impaired Eating/Feeding: NPO   Grooming: Total  assistance;Sitting Grooming Details (indicate cue type and reason): Unable 2/2 level of arousal. Does not grip washcloth when prompted.             Lower Body Dressing: Total assistance;Bed level                 General ADL Comments:  Patient limited by decreased level of arousal requirng Total A grossly for bed mobiltiy and observed ADLs.     Vision   Additional Comments: Unable to assess 2/2 decreased level of arousal.     Perception     Praxis      Pertinent Vitals/Pain Pain Assessment: Faces Faces Pain Scale: Hurts a little bit Pain Location: Generalized; with movement. Pain Descriptors / Indicators: Discomfort Pain Intervention(s): Monitored during session     Hand Dominance Right   Extremity/Trunk Assessment Upper Extremity Assessment Upper Extremity Assessment: RUE deficits/detail;LUE deficits/detail RUE Deficits / Details: Spontanous movement noted. Does not move RUE upon command. Withdrawl from painful stimuli. RUE Sensation:  (Withdrawl from painful stimuli.) RUE Coordination:  (Difficult to assess 2/2 level of arousal.) LUE Deficits / Details: Spontanous movement noted. Does not move RUE upon command. Withdrawl from painful stimuli. LUE Sensation:  (Withdrawl from painful stimuli.) LUE Coordination:  (Difficult to assess 2/2 level of arousal.)   Lower Extremity Assessment Lower Extremity Assessment: Defer to PT evaluation   Cervical / Trunk Assessment Cervical / Trunk Assessment: Kyphotic   Communication Communication Communication: Other (comment) (Unable to assess 2/2 decreased level of arousal.)   Cognition Arousal/Alertness: Lethargic Behavior During Therapy: Flat affect Overall Cognitive Status: Difficult to assess                                 General Comments: Eyes remained closed throughout assessment despite sternal rub and modifications to the environment to increase level of arousal. Patient able to answer yes/no questions accurately at times. Shakes head "no" when asked to wash hwer face.   General Comments  No family present at bedside. 1L O2 via [place] upon entry. SpO2 94-98% on RA. O2 remained off at conclusion of session. RN notified.    Exercises      Shoulder Instructions      Home Living Family/patient expects to be discharged to:: Private residence Living Arrangements: Spouse/significant other Available Help at Discharge: Family;Available 24 hours/day Type of Home: House Home Access: Stairs to enter [employer] of Steps: 2 Entrance Stairs-Rails: Right Home Layout: One level     Bathroom Shower/Tub: Tub/shower unit;Curtain   Firefighter: Standard     Home Equipment: Cane - single point;Other (comment) (upright walker)   Additional Comments: INformation from previous notes as pt unable to report      Prior Functioning/Environment Level of Independence: Independent with assistive device(s)        Comments: Per chart review from OP OT/PT notes 02/2020.        OT Problem List: Decreased strength;Decreased range of motion;Decreased activity tolerance;Impaired balance (sitting and/or standing);Decreased cognition      OT Treatment/Interventions: Self-care/ADL training;Therapeutic exercise;Neuromuscular education;Energy conservation;DME and/or AE instruction;Therapeutic activities;Cognitive remediation/compensation;Visual/perceptual remediation/compensation;Patient/family education;Balance training    OT Goals(Current goals can be found in the care plan section) Acute Rehab OT Goals Patient Stated Goal: Unable to state. OT Goal Formulation: Patient unable to participate in goal setting Time For Goal Achievement: 04/05/20 Potential to Achieve Goals: Good ADL Goals Additional ADL Goal #1: Patient will keep eyes open for 50%  of OT treatment session demonstrating increased alertness and level of arousal. Additional ADL Goal #2: Patient will maintain static sitting balance at EOB with no more than Min A in prep for seated ADLs. Additional ADL Goal #3: Patient will attend to 1 grooming task >1-3 min with Mod cues and hand over hand assist.  OT Frequency: Min 2X/week   Barriers to D/C:             Co-evaluation              AM-PAC OT "6 Clicks" Daily Activity     Outcome Measure Help from another person eating meals?: Total (NPO) Help from another person taking care of personal grooming?: Total Help from another person toileting, which includes using toliet, bedpan, or urinal?: Total Help from another person bathing (including washing, rinsing, drying)?: Total Help from another person to put on and taking off regular upper body clothing?: Total Help from another person to put on and taking off regular lower body clothing?: Total 6 Click Score: 6   End of Session Equipment Utilized During Treatment: Gait belt Nurse Communication: Mobility status;Other (comment) (Patient titrated to RA.)  Activity Tolerance: Patient limited by lethargy Patient left: in bed;with call bell/phone within reach;with bed alarm set;with restraints reapplied  OT Visit Diagnosis: Unsteadiness on feet (R26.81);Other abnormalities of gait and mobility (R26.89);Muscle weakness (generalized) (M62.81);Hemiplegia and hemiparesis Hemiplegia - caused by: Cerebral infarction                Time: 1006-1035 OT Time Calculation (min): 29 min Charges:  OT General Charges $OT Visit: 1 Visit OT Evaluation $OT Eval Moderate Complexity: 1 Mod  Kilie Rund H. OTR/L Supplemental OT, Department of rehab services [phone]  Evann Koelzer R H. 03/22/2020, 11:50 AM

## 2020-03-22 NOTE — Care Management Important Message (Signed)
Important Message  Patient Details  Name: Hannah Fischer MRN: [id number] Date of Birth: December 18, 1932   Medicare Important Message Given:  Yes     Bodhi Stenglein Stefan Church 03/22/2020, 3:41 PM

## 2020-03-22 NOTE — Evaluation (Signed)
Speech Language Pathology Evaluation Patient Details Name: AVAIAH STEMPEL MRN: [id number] DOB: 19-Nov-1932 Today's Date: 03/22/2020 Time: 9030-0923 SLP Time Calculation (min) (ACUTE ONLY): 20 min  Problem List:  Patient Active Problem List   Diagnosis Date Noted  . Acute encephalopathy 03/18/2020  . Gait abnormality 01/19/2020  . Chest pressure 01/19/2020  . Cerebral amyloid angiopathy (CODE)   . Stroke (HCC) 11/24/2019  . AKI (acute kidney injury) (HCC) 11/24/2019  . Spinal stenosis of lumbar region with neurogenic claudication 10/26/2019  . Abnormality of gait 08/02/2019  . Cerebrovascular accident (CVA) (HCC) 07/22/2019  . Dizziness 06/15/2019  . Brain aneurysm 06/15/2019  . Chronic pain syndrome 04/29/2019  . Chronic right-sided low back pain without sciatica 04/12/2019  . Arthritis of shoulder region, left, degenerative 12/03/2013  . Nonspecific abnormal electrocardiogram (ECG) (EKG) 10/15/2013  . Other and unspecified angina pectoris 10/15/2013  . TIA (transient ischemic attack) 11/15/2011  . HTN (hypertension) 11/15/2011  . Diabetes mellitus (HCC) 11/15/2011  . Hypothyroidism 11/15/2011  . Dyslipidemia 11/15/2011  . GERD (gastroesophageal reflux disease) 11/15/2011  . Pyuria 11/15/2011   Past Medical History:  Past Medical History:  Diagnosis Date  . Abnormal nuclear stress test    2005, normal cath Dr. Elease Hashimoto, normal stress test 2012 Dr. Eldridge Dace  . Anemia    chronic  . Anxiety   . Arthritis    djd  . Arthritis of shoulder region, left, degenerative 12/03/2013  . Blood transfusion 1979   for anemia  . Chest pain    evaluated @ [facility] cardiology; had a stress test 2012  . Chronic pain syndrome 04/29/2019  . Chronic right-sided low back pain without sciatica 04/12/2019  . Complication of anesthesia    unable to breathe lying  flat on back  . Diabetes mellitus    type 2  niddm x 25 yrs  . Diabetes mellitus (HCC) 11/15/2011  . Diverticular disease   . DJD  (degenerative joint disease)    lumbar and cervical  . GERD (gastroesophageal reflux disease)   . Hyperlipemia   . Hypertension    on medication  . Hypothyroidism    takes levoxyl  . Irritable bowel syndrome   . 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 Neuromuscular disorder (HCC)   . Neuropathy in diabetes (HCC)   . Nonspecific abnormal electrocardiogram (ECG) (EKG) 10/15/2013  . Other and unspecified angina pectoris    tests came back NOT heart related  . Pulmonary embolism (HCC) 06-2010   bilateral  . Pyuria 11/15/2011  . Seizures (HCC)    once, age 55  . TIA (transient ischemic attack) 11/15/2011   Past Surgical History:  Past Surgical History:  Procedure Laterality Date  . BACK SURGERY    . BLEPHAROPLASTY  2012   rt eye  . CARDIAC CATHETERIZATION     had one 10/2013  . CHOLECYSTECTOMY    . EYE SURGERY  2007   cataract ext/ iol rt eye  . JOINT REPLACEMENT  2001,2005   both knees  . LEFT HEART CATHETERIZATION WITH CORONARY ANGIOGRAM N/A 10/18/2013   Procedure: LEFT HEART CATHETERIZATION WITH CORONARY ANGIOGRAM;  Surgeon: Corky Crafts, MD;  Location: [facility] CATH LAB;  Service: Cardiovascular;  Laterality: N/A;  . LUMBAR DISC SURGERY  1990  . REVERSE SHOULDER ARTHROPLASTY Left 12/03/2013   Procedure: LEFT SHOULDER REVERSE ARTHROPLASTY;  Surgeon: Verlee Rossetti, MD;  Location: [facility] OR;  Service: Orthopedics;  Laterality: Left;  . SHOULDER HEMI-ARTHROPLASTY  12/25/2010  Procedure: SHOULDER HEMI-ARTHROPLASTY;  Surgeon: Cammy Copa;  Location: [facility] OR;  Service: Orthopedics;  Laterality: Right;   HPI:  Ms. LEASA KINCANNON is a 87 y.o. female with bilateral medial thalamic and punctate left superior cerebellar infarcts   Assessment / Plan / Recommendation Clinical Impression  Pt demonstrates impaired arousal, but with max intercation was able to demonstrate some cognitive linguitic ability. Pt able to follow single verbal commands x5 including "squeeze my  hand", "open your eyes" (attempted but unsuccessful), "look over here" "open your mouth." She was able to name her husband x3 with overt dysarthria, Responded verbally y/n several times in session. Her eyes remianed firmly closed and were difficult to open with total assist, but she did divert gaze to left to locate husband with a verbal cue. Though her arousal is impeding her cognition, she does show comprehension and verbal expression ability. Recommend f/u at SNF level will continue to target cognition acutely.    SLP Assessment  SLP Recommendation/Assessment: Patient needs continued Speech Lanaguage Pathology Services SLP Visit Diagnosis: Dysphagia, oropharyngeal phase (R13.12)    Follow Up Recommendations  Skilled Nursing facility    Frequency and Duration min 2x/week  2 weeks      SLP Evaluation Cognition  Overall Cognitive Status: Impaired/Different from baseline Arousal/Alertness: Lethargic Orientation Level: Oriented to person Attention: Focused Focused Attention: Impaired Focused Attention Impairment: Verbal basic;Functional basic       Comprehension  Auditory Comprehension Overall Auditory Comprehension: Impaired Yes/No Questions: Within Functional Limits Commands: Within Functional Limits Conversation: Simple    Expression Verbal Expression Overall Verbal Expression: Impaired Automatic Speech: Name Level of Generative/Spontaneous Verbalization: Word Repetition: No impairment Naming: Not tested Pragmatics: Unable to assess Interfering Components: Attention;Speech intelligibility Written Expression Dominant Hand: Right   Oral / Motor  Oral Motor/Sensory Function Overall Oral Motor/Sensory Function:  (appears WNL, but dysarthric) Motor Speech Overall Motor Speech: Impaired Respiration: Within functional limits Phonation: Low vocal intensity Articulation: Impaired Level of Impairment: Word Intelligibility: Intelligibility reduced Word: 50-74%  accurate Phrase: Not tested Sentence: Not tested Conversation: Not tested   GO                    Lutricia Widjaja, Riley Nearing 03/22/2020, 1:52 PM

## 2020-03-22 NOTE — Progress Notes (Addendum)
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  At that she had gone to bed last night and in the morning he had trouble waking her up. In the ED she was noted to be responsive to pain.  A code stroke was called.  She was outside the window for TPA.  An emergent CT did not show any acute abnormality.  A CTA was performed and did not reveal any obvious stenosis. An MRI of the brain was performed on 2/13:Punctate acute infarction in the left superior cerebellumadjacent to the superior cerebellar peduncle. Bilateral thalamic acute infarction extending back into the mid brain. This is consistent with artery of [place]on infarction.  Subjective: Continues to be nonresponsive and mainly just moans when you try to talk to her.    Assessment & Plan:   Principal Problem:   Acute encephalopathy secondary to bilateral thalamic infarcts -Prior history of embolic infarcts -The patient currently has an NG tube and remains quite lethargic-she is able to moan when used try to speak with her -Neurology and the stroke team have seen her -Started on amantadine yesterday and has been started on Ritalin today -Due to her lethargy, she has a core track and is receiving Osmolite -Palliative care has been consulted and the family would like to continue aggressive care but does not want CPR or shocks and thus she is a DNR  Active Problems: Grade 2 diastolic heart failure-chronic -2D echo during this admission reveals grade 2 diastolic dysfunction  CKD 3A- AKI due to dehydration -Baseline creatinine appears to be 1.0-1.3 -Creatinine is steadily rising and is now 1.85 along with a rise in BUN from  23-32 -will start free water per tube and watch I & O closely to prevent heart failure exacerbation    Diabetes mellitus 2 -Continue on insulin sliding scale every 4 hours    Component Value Date/Time   HGBA1C 7.6 (H) 03/18/2020 1936      Hypothyroidism -Continue Synthroid per tube    Time spent in minutes: 35 DVT prophylaxis: Heparin Code Status: DO NOT RESUSCITATE Family Communication: Husband Level of Care: Level of care: Progressive Disposition Plan:  Status is: Inpatient  Remains inpatient appropriate because:IV treatments appropriate due to intensity of illness or inability to take PO   Dispo: The patient is from: Home              Anticipated d/c is to: To be determined               Anticipated d/c date is: > 3 days              Patient currently is not medically stable to d/c.   Difficult to place patient No      Consultants:   Neurology Procedures:    Antimicrobials:  Anti-infectives (From admission, onward)   Start     Dose/Rate Route Frequency Ordered Stop   03/18/20 1900  azithromycin (ZITHROMAX) 500 mg in sodium chloride 0.9 % 250 mL IVPB  Status:  Discontinued        500 mg 250 mL/hr over 60 Minutes Intravenous Every 24 hours 03/18/20 1830 03/19/20 1106   03/18/20 1900  cefTRIAXone (ROCEPHIN) 2 g in sodium chloride  0.9 % 100 mL IVPB  Status:  Discontinued        2 g 200 mL/hr over 30 Minutes Intravenous Every 24 hours 03/18/20 1830 03/19/20 1106       Objective: Vitals:   03/22/20 0800 03/22/20 1153 03/22/20 1210 03/22/20 1528  BP: (!) 133/55 131/74  (!) 143/65  Pulse: 81 86  79  Resp:  (!) 22 19   Temp: 98.7 F (37.1 C) 99.1 F (37.3 C)  99.5 F (37.5 C)  TempSrc:  Oral  Oral  SpO2: 97% 94%  96%  Weight:      Height:        Intake/Output Summary (Last 24 hours) at 03/22/2020 1645 Last data filed at 03/22/2020 1603 Gross per 24 hour  Intake 1552.25 ml  Output 300 ml  Net 1252.25 ml   Filed Weights   03/18/20 1834 03/19/20  0030 03/19/20 0400  Weight: 78.5 kg 80.2 kg 80.2 kg    Examination: General exam: Appears comfortable  HEENT: PERRLA, no sclera icterus or thrush-core track is present in the left nares Respiratory system: Clear to auscultation. Respiratory effort normal. Cardiovascular system: S1 & S2 heard, RRR.  2/6 systolic murmur heard in the left upper sternal border Gastrointestinal system: Abdomen soft, non-tender, nondistended. Normal bowel sounds. Central nervous system: Lethargic-will not follow commands  Extremities: No cyanosis, clubbing or edema Skin: No rashes or ulcers  Data Reviewed: I have personally reviewed following labs and imaging studies  CBC: Recent Labs  Lab 03/18/20 1420 03/18/20 1719 03/18/20 1932 03/19/20 1128 03/20/20 0504 03/21/20 0534  WBC 3.8*  --   --  7.2 6.2 5.6  NEUTROABS 1.9  --   --   --   --   --   HGB 11.8* 12.9 12.2 12.8 13.4 13.0  HCT 37.7 38.0 36.0 39.8 41.5 38.8  MCV 101.9*  --   --  98.8 100.0 99.0  PLT 182  --   --  213 186 187   Basic Metabolic Panel: Recent Labs  Lab 03/18/20 1420 03/18/20 1719 03/18/20 1932 03/18/20 1936 03/19/20 1128 03/20/20 0504 03/20/20 1640 03/21/20 0534 03/21/20 1701 03/22/20 0406  NA 139   < > 139  --  140 141  --  141  --  143  K 4.4   < > 3.9  --  4.1 4.2  --  3.9  --  4.4  CL 101  --   --   --  104 104  --  105  --  107  CO2 27  --   --   --  26 25  --  25  --  27  GLUCOSE 176*  --   --   --  121* 144*  --  191*  --  193*  BUN 24*  --   --   --  23 26*  --  27*  --  32*  CREATININE 1.10*  --   --   --  1.29* 1.72*  --  1.75*  --  1.85*  CALCIUM 8.8*  --   --   --  9.1 8.8*  --  8.9  --  8.9  MG  --   --   --  1.7  --   --  2.1 2.0 2.1 2.2  PHOS  --   --   --  3.6  --   --  3.9 3.9 3.9 3.9   < > = values in this interval not displayed.  GFR: Estimated Creatinine Clearance: 22.3 mL/min (A) (by C-G formula based on SCr of 1.85 mg/dL (H)). Liver Function Tests: Recent Labs  Lab 03/18/20 1420  AST  20  ALT 11  ALKPHOS 56  BILITOT 0.6  PROT 5.5*  ALBUMIN 2.9*   No results for input(s): LIPASE, AMYLASE in the last 168 hours. Recent Labs  Lab 03/18/20 1615  AMMONIA 24   Coagulation Profile: Recent Labs  Lab 03/18/20 1420  INR 1.0   Cardiac Enzymes: No results for input(s): CKTOTAL, CKMB, CKMBINDEX, TROPONINI in the last 168 hours. BNP (last 3 results) No results for input(s): PROBNP in the last 8760 hours. HbA1C: No results for input(s): HGBA1C in the last 72 hours. CBG: Recent Labs  Lab 03/22/20 0007 03/22/20 0437 03/22/20 0800 03/22/20 1156 03/22/20 1530  GLUCAP 182* 213* 223* 169* 204*   Lipid Profile: No results for input(s): CHOL, HDL, LDLCALC, TRIG, CHOLHDL, LDLDIRECT in the last 72 hours. Thyroid Function Tests: No results for input(s): TSH, T4TOTAL, FREET4, T3FREE, THYROIDAB in the last 72 hours. Anemia Panel: No results for input(s): VITAMINB12, FOLATE, FERRITIN, TIBC, IRON, RETICCTPCT in the last 72 hours. Urine analysis:    Component Value Date/Time   COLORURINE YELLOW 03/18/2020 1647   APPEARANCEUR CLEAR 03/18/2020 1647   LABSPEC 1.010 03/18/2020 1647   PHURINE 5.5 03/18/2020 1647   GLUCOSEU NEGATIVE 03/18/2020 1647   HGBUR TRACE (A) 03/18/2020 1647   BILIRUBINUR NEGATIVE 03/18/2020 1647   KETONESUR NEGATIVE 03/18/2020 1647   PROTEINUR NEGATIVE 03/18/2020 1647   UROBILINOGEN 0.2 11/22/2013 0945   NITRITE NEGATIVE 03/18/2020 1647   LEUKOCYTESUR MODERATE (A) 03/18/2020 1647   Sepsis Labs: @LABRCNTIP (procalcitonin:4,lacticidven:4) ) Recent Results (from the past 240 hour(s))  Resp Panel by RT-PCR (Flu A&B, Covid) Nasopharyngeal Swab     Status: None   Collection Time: 03/18/20  3:10 PM   Specimen: Nasopharyngeal Swab; Nasopharyngeal(NP) swabs in vial transport medium  Result Value Ref Range Status   SARS Coronavirus 2 by RT PCR NEGATIVE NEGATIVE Final    Comment: (NOTE) SARS-CoV-2 target nucleic acids are NOT DETECTED.  The SARS-CoV-2  RNA is generally detectable in upper respiratory specimens during the acute phase of infection. The lowest concentration of SARS-CoV-2 viral copies this assay can detect is 138 copies/mL. A negative result does not preclude SARS-Cov-2 infection and should not be used as the sole basis for treatment or other patient management decisions. A negative result may occur with  improper specimen collection/handling, submission of specimen other than nasopharyngeal swab, presence of viral mutation(s) within the areas targeted by this assay, and inadequate number of viral copies(<138 copies/mL). A negative result must be combined with clinical observations, patient history, and epidemiological information. The expected result is Negative.  Fact Sheet for Patients:  BloggerCourse.comhttps://www.fda.gov/media/152166/download  Fact Sheet for Healthcare Providers:  SeriousBroker.ithttps://www.fda.gov/media/152162/download  This test is no t yet approved or cleared by the [place] FDA and  has been authorized for detection and/or diagnosis of SARS-CoV-2 by FDA under an Emergency Use Authorization (EUA). This EUA will remain  in effect (meaning this test can be used) for the duration of the COVID-19 declaration under Section 564(b)(1) of the Act, 21 U.S.C.section 360bbb-3(b)(1), unless the authorization is terminated  or revoked sooner.       Influenza A by PCR NEGATIVE NEGATIVE Final   Influenza B by PCR NEGATIVE NEGATIVE Final    Comment: (NOTE) The Xpert Xpress SARS-CoV-2/FLU/RSV plus assay is intended as an aid in the diagnosis of influenza from Nasopharyngeal swab specimens  and should not be used as a sole basis for treatment. Nasal washings and aspirates are unacceptable for Xpert Xpress SARS-CoV-2/FLU/RSV testing.  Fact Sheet for Patients: BloggerCourse.com  Fact Sheet for Healthcare Providers: SeriousBroker.it  This test is not yet approved or cleared by the  [place] FDA and has been authorized for detection and/or diagnosis of SARS-CoV-2 by FDA under an Emergency Use Authorization (EUA). This EUA will remain in effect (meaning this test can be used) for the duration of the COVID-19 declaration under Section 564(b)(1) of the Act, 21 U.S.C. section 360bbb-3(b)(1), unless the authorization is terminated or revoked.  Performed at [facility], [address], [place], [place] [address]   Blood culture (routine x 2)     Status: None (Preliminary result)   Collection Time: 03/18/20  3:11 PM   Specimen: BLOOD  Result Value Ref Range Status   Specimen Description BLOOD SITE NOT SPECIFIED  Final   Special Requests   Final    BOTTLES DRAWN AEROBIC AND ANAEROBIC Blood Culture results may not be optimal due to an inadequate volume of blood received in culture bottles   Culture   Final    NO GROWTH 4 DAYS Performed at [facility], [address], [place], [place] [address]    Report Status PENDING  Incomplete  Blood culture (routine x 2)     Status: None (Preliminary result)   Collection Time: 03/18/20  3:11 PM   Specimen: BLOOD  Result Value Ref Range Status   Specimen Description BLOOD SITE NOT SPECIFIED  Final   Special Requests   Final    BOTTLES DRAWN AEROBIC AND ANAEROBIC Blood Culture results may not be optimal due to an inadequate volume of blood received in culture bottles   Culture   Final    NO GROWTH 4 DAYS Performed at [facility], [address], [place], [place] [address]    Report Status PENDING  Incomplete  Culture, Urine     Status: Abnormal   Collection Time: 03/18/20  5:59 PM   Specimen: Urine, Catheterized  Result Value Ref Range Status   Specimen Description URINE, CATHETERIZED  Final   Special Requests Normal  Final   Culture (A)  Final    50,000 COLONIES/mL GROUP B STREP(S.AGALACTIAE)ISOLATED TESTING AGAINST S. AGALACTIAE NOT ROUTINELY PERFORMED DUE TO PREDICTABILITY OF AMP/PEN/VAN  SUSCEPTIBILITY. Performed at [facility], [address], [place], [place] [address]    Report Status 03/19/2020 FINAL  Final  MRSA PCR Screening     Status: Abnormal   Collection Time: 03/18/20  6:35 PM   Specimen: Nasal Mucosa; Nasopharyngeal  Result Value Ref Range Status   MRSA by PCR POSITIVE (A) NEGATIVE Final    Comment:        The GeneXpert MRSA Assay (FDA approved for NASAL specimens only), is one component of a comprehensive MRSA colonization surveillance program. It is not intended to diagnose MRSA infection nor to guide or monitor treatment for MRSA infections. RESULT CALLED TO, READ BACK BY AND VERIFIED WITH: [facility] RN AT 2041 03/18/2020 MITCHELL,L Performed at [facility], [address], [place], [place] [address]          Radiology Studies: MR MRV HEAD W WO CONTRAST  Result Date: 03/21/2020 CLINICAL DATA:  Dural venous sinus thrombosis. EXAM: MR VENOGRAM HEAD WITHOUT AND WITH CONTRAST TECHNIQUE: Angiographic images of the intracranial venous structures were obtained using MRV technique without and with intravenous contrast. CONTRAST:  46mL GADAVIST GADOBUTROL 1 MMOL/ML IV SOLN COMPARISON:  None. FINDINGS: The left transverse sinus is mildly hypoplastic. Nonocclusive filling defect in the bilateral transverse sigmoid junction (series 600, image 91 and 242), more pronounced on the right. Small round filling defect within the left transverse sinus corresponds to inaccurate noise granulation. (Series 600, image 200). Superior sagittal sinus, internal cerebral veins, vein of Galen, straight sinus and jugular bulbs are patent without evidence of thrombus or stenosis. IMPRESSION: Nonocclusive filling defect in the bilateral transverse sigmoid junction, may represent nonocclusive thrombus. Electronically Signed   By: Baldemar Lenis M.D.   On: 03/21/2020 13:25      Scheduled Meds: . amantadine  100 mg Per Tube BID  . aspirin  81 mg Per Tube  Daily   Or  . aspirin  300 mg Rectal Daily  . chlorhexidine  15 mL Mouth Rinse BID  . Chlorhexidine Gluconate Cloth  6 each Topical Daily  . feeding supplement (PROSource TF)  45 mL Per Tube TID  . heparin  5,000 Units Subcutaneous Q8H  . insulin aspart  0-9 Units Subcutaneous Q4H  . levothyroxine  112 mcg Per Tube QAC breakfast  . mouth rinse  15 mL Mouth Rinse q12n4p  . methylphenidate  5 mg Oral BID WC  . mupirocin ointment  1 application Nasal BID  . rosuvastatin  5 mg Per Tube Daily  . sodium chloride flush  3 mL Intravenous Once   Continuous Infusions: . sodium chloride 10 mL/hr at 03/19/20 0500  . feeding supplement (OSMOLITE 1.2 CAL) 1,000 mL (03/22/20 1339)     LOS: 4 days      Calvert Cantor, MD [facility] Pager: [email or web address] 03/22/2020, 4:45 PM

## 2020-03-23 DIAGNOSIS — G9340 Encephalopathy, unspecified: Secondary | ICD-10-CM | POA: Diagnosis not present

## 2020-03-23 DIAGNOSIS — Z515 Encounter for palliative care: Secondary | ICD-10-CM | POA: Diagnosis not present

## 2020-03-23 LAB — CULTURE, BLOOD (ROUTINE X 2)
Culture: NO GROWTH
Culture: NO GROWTH

## 2020-03-23 LAB — BASIC METABOLIC PANEL
Anion gap: 13 (ref 5–15)
BUN: 39 mg/dL — ABNORMAL HIGH (ref 8–23)
CO2: 23 mmol/L (ref 22–32)
Calcium: 9.1 mg/dL (ref 8.9–10.3)
Chloride: 106 mmol/L (ref 98–111)
Creatinine, Ser: 2.04 mg/dL — ABNORMAL HIGH (ref 0.44–1.00)
GFR, Estimated: 23 mL/min — ABNORMAL LOW (ref >60)
Glucose, Bld: 194 mg/dL — ABNORMAL HIGH (ref 70–99)
Potassium: 4.9 mmol/L (ref 3.5–5.1)
Sodium: 142 mmol/L (ref 135–145)

## 2020-03-23 LAB — GLUCOSE, CAPILLARY
Glucose-Capillary: 153 mg/dL — ABNORMAL HIGH (ref 70–99)
Glucose-Capillary: 160 mg/dL — ABNORMAL HIGH (ref 70–99)
Glucose-Capillary: 161 mg/dL — ABNORMAL HIGH (ref 70–99)
Glucose-Capillary: 183 mg/dL — ABNORMAL HIGH (ref 70–99)
Glucose-Capillary: 186 mg/dL — ABNORMAL HIGH (ref 70–99)
Glucose-Capillary: 187 mg/dL — ABNORMAL HIGH (ref 70–99)
Glucose-Capillary: 206 mg/dL — ABNORMAL HIGH (ref 70–99)

## 2020-03-23 MED ORDER — FREE WATER
200.0000 mL | Status: DC
  Administered 2020-03-23 – 2020-03-28 (×31): 200 mL

## 2020-03-23 MED ORDER — SODIUM CHLORIDE 0.9 % IV BOLUS
500.0000 mL | Freq: Once | INTRAVENOUS | Status: AC
  Administered 2020-03-23: 500 mL via INTRAVENOUS

## 2020-03-23 NOTE — Plan of Care (Signed)

## 2020-03-23 NOTE — Progress Notes (Signed)
   Palliative Medicine Inpatient Follow Up Note   I spoke to patients husband, [name] this morning. We plan to meet tomorrow around 10:30AM to discuss patient present health state and the plan moving forward.  No Charge ______________________________________________________________________________________ Lamarr Lulas [facility] Palliative Medicine Team Team Cell Phone: [phone] Please utilize secure chat with additional questions, if there is no response within 30 minutes please call the above phone number  Palliative Medicine Team providers are available by phone from 7am to 7pm daily and can be reached through the team cell phone.  Should this patient require assistance outside of these hours, please call the patient's attending physician.

## 2020-03-23 NOTE — Progress Notes (Signed)
Physical Therapy Treatment Patient Details Name: Hannah Fischer MRN: [id number] DOB: Jun 05, 1932 Today's Date: 03/23/2020    History of Present Illness 87 y.o. female presenting code stroke. Imaging (+) for bilateral medial thalamic and punctate infarcts and L superior cerebellar infarcts. PMHx significant for multiple CVAs with most recent acute/subacute R frontal lobe and L parietal and occipital lobe infarcts ~3 months ago, residual L-sided hemiplegia with recent d/c from outpatient PT/OT with return to baseline level of function, TIA, DM, PE, anxiety, HTN, and HLD.    PT Comments    Pt tolerates treatment well with improved command following this session despite maintaining eyes closed. Pt continues to require significant physical assistance for all functional mobility tasks but initiates pulling into sitting through railing and PT UE. Pt will benefit from continued acute PT POC to improve mobility quality and to progress to more out of bed activity.   Follow Up Recommendations  SNF;Supervision/Assistance - 24 hour     Equipment Recommendations  Wheelchair cushion (measurements PT);Wheelchair (measurements PT);Hospital bed (mechanical lift)    Recommendations for Other Services       Precautions / Restrictions Precautions Precautions: Fall Precaution Comments: cortrak, bilateral hand mitten restraints Restrictions Weight Bearing Restrictions: No    Mobility  Bed Mobility Overal bed mobility: Needs Assistance Bed Mobility: Supine to Sit;Sit to Supine     Supine to sit: Max assist Sit to supine: Total assist        Transfers Overall transfer level: Needs assistance Equipment used: 1 person hand held assist Transfers: Sit to/from Stand Sit to Stand: Max assist            Ambulation/Gait                 Stairs             Wheelchair Mobility    Modified Rankin (Stroke Patients Only) Modified Rankin (Stroke Patients Only) Pre-Morbid Rankin  Score: No significant disability Modified Rankin: Severe disability     Balance Overall balance assessment: Needs assistance Sitting-balance support: Single extremity supported;Bilateral upper extremity supported;Feet supported Sitting balance-Leahy Scale: Poor Sitting balance - Comments: reliant on PT support or lean onto elbow Postural control: Right lateral lean Standing balance support: Bilateral upper extremity supported Standing balance-Leahy Scale: Poor Standing balance comment: maxA                            Cognition Arousal/Alertness: Lethargic (initially lethargic, consistently responds to commands with arousal despite eyes remaining closed most of session) Behavior During Therapy: Flat affect Overall Cognitive Status: Impaired/Different from baseline Area of Impairment: Attention;Following commands;Safety/judgement;Awareness;Problem solving                   Current Attention Level: Focused   Following Commands: Follows one step commands with increased time     Problem Solving: Slow processing General Comments: eyes closed throughout session unless cued to open eyes. Pt follows one step commands with all extremities with increased time      Exercises      General Comments General comments (skin integrity, edema, etc.): VSS on RA, pt incontinent of stool during session      Pertinent Vitals/Pain Pain Assessment: Faces Faces Pain Scale: Hurts a little bit Pain Location: back Pain Descriptors / Indicators: Grimacing Pain Intervention(s): Monitored during session    Home Living  Prior Function            PT Goals (current goals can now be found in the care plan section) Acute Rehab PT Goals Patient Stated Goal: Unable to state. Progress towards PT goals: Progressing toward goals (very slowly)    Frequency    Min 3X/week      PT Plan Current plan remains appropriate    Co-evaluation               AM-PAC PT "6 Clicks" Mobility   Outcome Measure  Help needed turning from your back to your side while in a flat bed without using bedrails?: A Lot Help needed moving from lying on your back to sitting on the side of a flat bed without using bedrails?: A Lot Help needed moving to and from a bed to a chair (including a wheelchair)?: Total Help needed standing up from a chair using your arms (e.g., wheelchair or bedside chair)?: A Lot Help needed to walk in hospital room?: Total Help needed climbing 3-5 steps with a railing? : Total 6 Click Score: 9    End of Session   Activity Tolerance: Patient tolerated treatment well Patient left: in bed;with call bell/phone within reach;with bed alarm set Nurse Communication: Mobility status;Need for lift equipment PT Visit Diagnosis: Other symptoms and signs involving the nervous system (R29.898);Difficulty in walking, not elsewhere classified (R26.2);Unsteadiness on feet (R26.81);Muscle weakness (generalized) (M62.81)     Time: 1007-1219 PT Time Calculation (min) (ACUTE ONLY): 16 min  Charges:  $Therapeutic Activity: 8-22 mins                     Arlyss Gandy, PT, DPT Acute Rehabilitation Pager: [phone]    Arlyss Gandy 03/23/2020, 5:47 PM

## 2020-03-23 NOTE — Progress Notes (Signed)
  Speech Language Pathology Treatment: Dysphagia  Patient Details Name: Hannah Fischer MRN: [id number] DOB: Apr 27, 1932 Today's Date: 03/23/2020 Time: 1100-1120 SLP Time Calculation (min) (ACUTE ONLY): 20 min  Assessment / Plan / Recommendation Clinical Impression  Mentation remains barrier for PO readiness. Pt with eyes closed during interaction with SLP but was able to respond verbally some to questions and multimodal cues. Pt repositioned fully upright, spouse at bedside. Assessed with ice chips, puree, and honey thick liquids via cup. Pt with delay oral transit of bolus, suspected delay in swallow initiation and mild oral residuals. Palpable swallows were noted. Delayed cough noted x1 however vocal quality remained clear. Continue PO trials with SLP. Anticipate if mentation/arousal improves pt will be able to tolerate PO diet.    HPI HPI: Hannah Fischer is a 87 y.o. female with bilateral medial thalamic and punctate left superior cerebellar infarcts      SLP Plan  Continue with current plan of care       Recommendations  Diet recommendations: NPO Medication Administration: Via alternative means                Oral Care Recommendations: Oral care QID Follow up Recommendations: Skilled Nursing facility SLP Visit Diagnosis: Dysphagia, oropharyngeal phase (R13.12) Plan: Continue with current plan of care       GO                Ardyth Gal MA, CCC-SLP Acute Rehabilitation Services   03/23/2020, 11:25 AM

## 2020-03-23 NOTE — Progress Notes (Signed)
Stroke Team Progress Note  SUBJECTIVE Patient neuro status is unchanged and yet   remains sleepy but can be aroused with tactile and verbal stimuli but is unable to follow commands or carry on a conversation just makes guttural sounds.  She localizes moves all 4 extremities well against gravity.  Vital signs are stable .  BMP today shows increasing creatinine 2.04.   She is on amantadine 100 mg twice daily and ritalin 5 mg twice daily but has not been effective.  Husband is at the bedside.  Palliative care team has met with the husband and have a meeting planned for tomorrow to discuss goals of care further OBJECTIVE Most recent Vital Signs: Temp: 98.5 F (36.9 C) (02/17 1550) Temp Source: Axillary (02/17 1550) BP: 148/69 (02/17 1601) Pulse Rate: 84 (02/17 1550) Respiratory Rate: 18 O2 Saturdation: 97%  CBG (last 3)  Recent Labs    03/23/20 0745 03/23/20 1134 03/23/20 1537  GLUCAP 187* 186* 206*       Studies:  CT head no acute abnormalities MRI bilateral medial thalamic punctate left superior cerebellar infarct CTA suboptimal due to contrast timing but no LVO.  Diminutive flow in the right vertebral artery ECHO on 11/24/2019 ejection fraction 6065%.  No wall motion abnormalities. LDL 155 mg percent HbA1c 7.6 EEG pending  Physical Exam:     Frail elderly Caucasian lady is lying comfortably in bed.  Not in distress. . Afebrile. Head is nontraumatic. Neck is supple without bruit.    Cardiac exam no murmur or gallop. Lungs are clear to auscultation. Distal pulses are well felt. Neurological Exam :  Patient remains sleepy.  She responds to sternal rub with partially opening her eyes and muttering in few words but not able to speak sentences.  She was able to stick out her tongue and move her extremities to command. She moves right upper and both lower extremities purposefully there is movement against gravity in left upper extremity but appears to have some weakness.  Slight  dysconjugate gaze with both eyes deviated downwards at rest with left eye hypertropia both pupils are 3 to 4 mm sluggishly reactive.  Corneal reflexes are brisk Doll's eye movements are sluggish.  She does not blink to threat on either side.  Face appears symmetric.  Plantars are both upgoing.  Gait not tested ASSESSMENT [name] is a 87 y.o. 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  Tiny left cerebellar punctate infarct may also be a concurrent lacune.  However she has had previous history of embolic as well as small vessel infarcts in October 21 hence strong suspicion for underlying paroxysmal A. fib.  Hospital day # 5  TREATMENT/PLAN Patient neurological exam has not shown significant improvement despite amantadine 100 mg and Ritalin 5 mg twice daily.    Patient prognosis appears poor and she will at high risk for developing aspiration pneumonia and other complications.  Family has agreed to DNR but would like to support her for a few more days.  Discussed with patient's husband at the bedside   and answered questions.  Discussed with Dr. Wynelle Cleveland.  Greater than 50% time during the 25-minute visit was spent in counseling and coordination of care about her right thalamic infarct and decreases forcefulness discussion  of treatment and answering questions.  Antony Contras, MD Medical Director [facility] Pager: [phone] 03/23/2020 4:52 PM

## 2020-03-23 NOTE — Plan of Care (Signed)

## 2020-03-23 NOTE — Progress Notes (Signed)
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  At that she had gone to bed last night and in the morning he had trouble waking her up. In the ED she was noted to be responsive to pain.  A code stroke was called.  She was outside the window for TPA.  An emergent CT did not show any acute abnormality.  A CTA was performed and did not reveal any obvious stenosis. An MRI of the brain was performed on 2/13:Punctate acute infarction in the left superior cerebellumadjacent to the superior cerebellar peduncle. Bilateral thalamic acute infarction extending back into the mid brain. This is consistent with artery of Percheron infarction.  Subjective: Very minimally responsive today.     Assessment & Plan:   Principal Problem:   Acute encephalopathy secondary to bilateral thalamic infarcts -Prior history of embolic infarcts -The patient currently has an NG tube and remains quite lethargic-she is able to moan when used try to speak with her -Neurology and the stroke team have seen her -Started on amantadine and Ritalin to help improve her lethargy- she remains quite lethargic today -Due to her lethargy, she has a cor track and is receiving Osmolite -Palliative care has been consulted and the family would like to continue aggressive care but does not want CPR or shocks and thus she is a DNR  Active Problems: Grade 2 diastolic heart failure-chronic -2D echo during this admission reveals grade 2 diastolic dysfunction  CKD 3A- AKI due to dehydration -Baseline creatinine appears to be 1.0-1.3 -Creatinine is steadily rising and is now 1.85 along with a rise in BUN from 23-32 -started free water per  tube and watching I & O closely to prevent heart failure exacerbation - as Cr has risen today and urine output was only 600 cc yesterday, I have added a 500 cc fluid bolus today-    Diabetes mellitus 2 -Continue on insulin sliding scale every 4 hours while she is on tube feeds Component Value Date/Time   HGBA1C 7.6 (H) 03/18/2020 1936      Hypothyroidism -Continue Synthroid per tube    Time spent in minutes: 35 DVT prophylaxis: Heparin Code Status: DO NOT RESUSCITATE Family Communication: Husband Level of Care: Level of care: Progressive Disposition Plan:  Status is: Inpatient  Remains inpatient appropriate because:IV treatments appropriate due to intensity of illness or inability to take PO   Dispo: The patient is from: Home              Anticipated d/c is to: To be determined               Anticipated d/c date is: > 3 days              Patient currently is not medically stable to d/c.   Difficult to place patient No   Consultants:   Neurology Procedures:   Cor trac Antimicrobials:  Anti-infectives (From admission, onward)   Start     Dose/Rate Route Frequency Ordered Stop   03/18/20 1900  azithromycin (ZITHROMAX) 500 mg in sodium chloride 0.9 % 250 mL IVPB  Status:  Discontinued        500 mg 250 mL/hr over 60 Minutes Intravenous Every 24  hours 03/18/20 1830 03/19/20 1106   03/18/20 1900  cefTRIAXone (ROCEPHIN) 2 g in sodium chloride 0.9 % 100 mL IVPB  Status:  Discontinued        2 g 200 mL/hr over 30 Minutes Intravenous Every 24 hours 03/18/20 1830 03/19/20 1106       Objective: Vitals:   03/22/20 1957 03/22/20 2353 03/23/20 0349 03/23/20 0819  BP: (!) 142/65 (!) 144/77 (!) 156/63 138/66  Pulse: 85 88 81 82  Resp: 18 18 18 20   Temp: 99.4 F (37.4 C) 99.5 F (37.5 C) 98.7 F (37.1 C) 98.7 F (37.1 C)  TempSrc: Oral Oral Oral Oral  SpO2: 95% 94% 96% 95%  Weight:   79.3 kg   Height:        Intake/Output Summary (Last 24 hours) at 03/23/2020  1111 Last data filed at 03/23/2020 1030 Gross per 24 hour  Intake 1537.25 ml  Output 950 ml  Net 587.25 ml   Filed Weights   03/19/20 0030 03/19/20 0400 03/23/20 0349  Weight: 80.2 kg 80.2 kg 79.3 kg    Examination: General exam: Appears comfortable  HEENT: PERRLA,  no sclera icterus or thrush- NG tube present Respiratory system: Clear to auscultation. Respiratory effort normal. Cardiovascular system: S1 & S2 heard, regular rate and rhythm Gastrointestinal system: Abdomen soft, non-tender, nondistended. Normal bowel sounds   Central nervous system: lethargic   Extremities: No cyanosis, clubbing or edema Skin: No rashes or ulcers Psychiatry:  cannot assess  Data Reviewed: I have personally reviewed following labs and imaging studies  CBC: Recent Labs  Lab 03/18/20 1420 03/18/20 1719 03/18/20 1932 03/19/20 1128 03/20/20 0504 03/21/20 0534  WBC 3.8*  --   --  7.2 6.2 5.6  NEUTROABS 1.9  --   --   --   --   --   HGB 11.8* 12.9 12.2 12.8 13.4 13.0  HCT 37.7 38.0 36.0 39.8 41.5 38.8  MCV 101.9*  --   --  98.8 100.0 99.0  PLT 182  --   --  213 186 187   Basic Metabolic Panel: Recent Labs  Lab 03/18/20 1936 03/19/20 1128 03/20/20 0504 03/20/20 1640 03/21/20 0534 03/21/20 1701 03/22/20 0406 03/23/20 0333  NA  --  140 141  --  141  --  143 142  K  --  4.1 4.2  --  3.9  --  4.4 4.9  CL  --  104 104  --  105  --  107 106  CO2  --  26 25  --  25  --  27 23  GLUCOSE  --  121* 144*  --  191*  --  193* 194*  BUN  --  23 26*  --  27*  --  32* 39*  CREATININE  --  1.29* 1.72*  --  1.75*  --  1.85* 2.04*  CALCIUM  --  9.1 8.8*  --  8.9  --  8.9 9.1  MG 1.7  --   --  2.1 2.0 2.1 2.2  --   PHOS 3.6  --   --  3.9 3.9 3.9 3.9  --    GFR: Estimated Creatinine Clearance: 20.1 mL/min (A) (by C-G formula based on SCr of 2.04 mg/dL (H)). Liver Function Tests: Recent Labs  Lab 03/18/20 1420  AST 20  ALT 11  ALKPHOS 56  BILITOT 0.6  PROT 5.5*  ALBUMIN 2.9*   No results  for input(s): LIPASE, AMYLASE in the last 168 hours. Recent  Labs  Lab 03/18/20 1615  AMMONIA 24   Coagulation Profile: Recent Labs  Lab 03/18/20 1420  INR 1.0   Cardiac Enzymes: No results for input(s): CKTOTAL, CKMB, CKMBINDEX, TROPONINI in the last 168 hours. BNP (last 3 results) No results for input(s): PROBNP in the last 8760 hours. HbA1C: No results for input(s): HGBA1C in the last 72 hours. CBG: Recent Labs  Lab 03/22/20 1530 03/22/20 1958 03/22/20 2352 03/23/20 0349 03/23/20 0745  GLUCAP 204* 174* 166* 183* 187*   Lipid Profile: No results for input(s): CHOL, HDL, LDLCALC, TRIG, CHOLHDL, LDLDIRECT in the last 72 hours. Thyroid Function Tests: No results for input(s): TSH, T4TOTAL, FREET4, T3FREE, THYROIDAB in the last 72 hours. Anemia Panel: No results for input(s): VITAMINB12, FOLATE, FERRITIN, TIBC, IRON, RETICCTPCT in the last 72 hours. Urine analysis:    Component Value Date/Time   COLORURINE YELLOW 03/18/2020 1647   APPEARANCEUR CLEAR 03/18/2020 1647   LABSPEC 1.010 03/18/2020 1647   PHURINE 5.5 03/18/2020 1647   GLUCOSEU NEGATIVE 03/18/2020 1647   HGBUR TRACE (A) 03/18/2020 1647   BILIRUBINUR NEGATIVE 03/18/2020 1647   KETONESUR NEGATIVE 03/18/2020 1647   PROTEINUR NEGATIVE 03/18/2020 1647   UROBILINOGEN 0.2 11/22/2013 0945   NITRITE NEGATIVE 03/18/2020 1647   LEUKOCYTESUR MODERATE (A) 03/18/2020 1647   Sepsis Labs: (procalcitonin:4,lacticidven:4) ) Recent Results (from the past 240 hour(s))  Resp Panel by RT-PCR (Flu A&B, Covid) Nasopharyngeal Swab     Status: None   Collection Time: 03/18/20  3:10 PM   Specimen: Nasopharyngeal Swab; Nasopharyngeal(NP) swabs in vial transport medium  Result Value Ref Range Status   SARS Coronavirus 2 by RT PCR NEGATIVE NEGATIVE Final    Comment: (NOTE) SARS-CoV-2 target nucleic acids are NOT DETECTED.  The SARS-CoV-2 RNA is generally detectable in upper respiratory specimens during the acute phase  of infection. The lowest concentration of SARS-CoV-2 viral copies this assay can detect is 138 copies/mL. A negative result does not preclude SARS-Cov-2 infection and should not be used as the sole basis for treatment or other patient management decisions. A negative result may occur with  improper specimen collection/handling, submission of specimen other than nasopharyngeal swab, presence of viral mutation(s) within the areas targeted by this assay, and inadequate number of viral copies(<138 copies/mL). A negative result must be combined with clinical observations, patient history, and epidemiological information. The expected result is Negative.  Fact Sheet for Patients:  BloggerCourse.com  Fact Sheet for Healthcare Providers:  SeriousBroker.it  This test is no t yet approved or cleared by the [place] FDA and  has been authorized for detection and/or diagnosis of SARS-CoV-2 by FDA under an Emergency Use Authorization (EUA). This EUA will remain  in effect (meaning this test can be used) for the duration of the COVID-19 declaration under Section 564(b)(1) of the Act, 21 U.S.C.section 360bbb-3(b)(1), unless the authorization is terminated  or revoked sooner.       Influenza A by PCR NEGATIVE NEGATIVE Final   Influenza B by PCR NEGATIVE NEGATIVE Final    Comment: (NOTE) The Xpert Xpress SARS-CoV-2/FLU/RSV plus assay is intended as an aid in the diagnosis of influenza from Nasopharyngeal swab specimens and should not be used as a sole basis for treatment. Nasal washings and aspirates are unacceptable for Xpert Xpress SARS-CoV-2/FLU/RSV testing.  Fact Sheet for Patients: BloggerCourse.com  Fact Sheet for Healthcare Providers: SeriousBroker.it  This test is not yet approved or cleared by the [place] FDA and has been authorized for detection and/or diagnosis of  SARS-CoV-2 by FDA under an Emergency Use Authorization (EUA). This EUA will remain in effect (meaning this test can be used) for the duration of the COVID-19 declaration under Section 564(b)(1) of the Act, 21 U.S.C. section 360bbb-3(b)(1), unless the authorization is terminated or revoked.  Performed at [facility], [address] [place], [place] [address]   Blood culture (routine x 2)     Status: None   Collection Time: 03/18/20  3:11 PM   Specimen: BLOOD  Result Value Ref Range Status   Specimen Description BLOOD SITE NOT SPECIFIED  Final   Special Requests   Final    BOTTLES DRAWN AEROBIC AND ANAEROBIC Blood Culture results may not be optimal due to an inadequate volume of blood received in culture bottles   Culture   Final    NO GROWTH 5 DAYS Performed at [facility], [address], [place], [place] [address]    Report Status 03/23/2020 FINAL  Final  Blood culture (routine x 2)     Status: None   Collection Time: 03/18/20  3:11 PM   Specimen: BLOOD  Result Value Ref Range Status   Specimen Description BLOOD SITE NOT SPECIFIED  Final   Special Requests   Final    BOTTLES DRAWN AEROBIC AND ANAEROBIC Blood Culture results may not be optimal due to an inadequate volume of blood received in culture bottles   Culture   Final    NO GROWTH 5 DAYS Performed at [facility], [address] [place], [place] [address]    Report Status 03/23/2020 FINAL  Final  Culture, Urine     Status: Abnormal   Collection Time: 03/18/20  5:59 PM   Specimen: Urine, Catheterized  Result Value Ref Range Status   Specimen Description URINE, CATHETERIZED  Final   Special Requests Normal  Final   Culture (A)  Final    50,000 COLONIES/mL GROUP B STREP(S.AGALACTIAE)ISOLATED TESTING AGAINST S. AGALACTIAE NOT ROUTINELY PERFORMED DUE TO PREDICTABILITY OF AMP/PEN/VAN SUSCEPTIBILITY. Performed at [facility], [address], [place], [place] [address]    Report Status  03/19/2020 FINAL  Final  MRSA PCR Screening     Status: Abnormal   Collection Time: 03/18/20  6:35 PM   Specimen: Nasal Mucosa; Nasopharyngeal  Result Value Ref Range Status   MRSA by PCR POSITIVE (A) NEGATIVE Final    Comment:        The GeneXpert MRSA Assay (FDA approved for NASAL specimens only), is one component of a comprehensive MRSA colonization surveillance program. It is not intended to diagnose MRSA infection nor to guide or monitor treatment for MRSA infections. RESULT CALLED TO, READ BACK BY AND VERIFIED WITH: [facility] RN AT 2041 03/18/2020 MITCHELL,L Performed at [facility], [address], [place], [place] [address]          Radiology Studies: MR MRV HEAD W WO CONTRAST  Result Date: 03/21/2020 CLINICAL DATA:  Dural venous sinus thrombosis. EXAM: MR VENOGRAM HEAD WITHOUT AND WITH CONTRAST TECHNIQUE: Angiographic images of the intracranial venous structures were obtained using MRV technique without and with intravenous contrast. CONTRAST:  66mL GADAVIST GADOBUTROL 1 MMOL/ML IV SOLN COMPARISON:  None. FINDINGS: The left transverse sinus is mildly hypoplastic. Nonocclusive filling defect in the bilateral transverse sigmoid junction (series 600, image 91 and 242), more pronounced on the right. Small round filling defect within the left transverse sinus corresponds to inaccurate noise granulation. (Series 600, image 200). Superior sagittal sinus, internal  cerebral veins, vein of Galen, straight sinus and jugular bulbs are patent without evidence of thrombus or stenosis. IMPRESSION: Nonocclusive filling defect in the bilateral transverse sigmoid junction, may represent nonocclusive thrombus. Electronically Signed   By: Baldemar Lenis M.D.   On: 03/21/2020 13:25      Scheduled Meds: . amantadine  100 mg Per Tube BID  . aspirin  81 mg Per Tube Daily   Or  . aspirin  300 mg Rectal Daily  . chlorhexidine  15 mL Mouth Rinse BID  . Chlorhexidine Gluconate  Cloth  6 each Topical Daily  . feeding supplement (PROSource TF)  45 mL Per Tube TID  . free water  200 mL Per Tube Q4H  . heparin  5,000 Units Subcutaneous Q8H  . insulin aspart  0-9 Units Subcutaneous Q4H  . levothyroxine  112 mcg Per Tube QAC breakfast  . mouth rinse  15 mL Mouth Rinse q12n4p  . methylphenidate  5 mg Per Tube BID WC  . rosuvastatin  5 mg Per Tube Daily  . sodium chloride flush  3 mL Intravenous Once   Continuous Infusions: . sodium chloride 10 mL/hr at 03/19/20 0500  . feeding supplement (OSMOLITE 1.2 CAL) 1,000 mL (03/22/20 1339)     LOS: 5 days      Calvert Cantor, MD [facility] Pager: [email or web address] 03/23/2020, 11:11 AM

## 2020-03-24 DIAGNOSIS — Z66 Do not resuscitate: Secondary | ICD-10-CM | POA: Diagnosis not present

## 2020-03-24 DIAGNOSIS — Z7189 Other specified counseling: Secondary | ICD-10-CM | POA: Diagnosis not present

## 2020-03-24 DIAGNOSIS — Z515 Encounter for palliative care: Secondary | ICD-10-CM | POA: Diagnosis not present

## 2020-03-24 DIAGNOSIS — G9340 Encephalopathy, unspecified: Secondary | ICD-10-CM | POA: Diagnosis not present

## 2020-03-24 LAB — BASIC METABOLIC PANEL
Anion gap: 12 (ref 5–15)
BUN: 40 mg/dL — ABNORMAL HIGH (ref 8–23)
CO2: 24 mmol/L (ref 22–32)
Calcium: 9 mg/dL (ref 8.9–10.3)
Chloride: 101 mmol/L (ref 98–111)
Creatinine, Ser: 2.05 mg/dL — ABNORMAL HIGH (ref 0.44–1.00)
GFR, Estimated: 23 mL/min — ABNORMAL LOW (ref >60)
Glucose, Bld: 186 mg/dL — ABNORMAL HIGH (ref 70–99)
Potassium: 5 mmol/L (ref 3.5–5.1)
Sodium: 137 mmol/L (ref 135–145)

## 2020-03-24 LAB — GLUCOSE, CAPILLARY
Glucose-Capillary: 185 mg/dL — ABNORMAL HIGH (ref 70–99)
Glucose-Capillary: 183 mg/dL — ABNORMAL HIGH (ref 70–99)
Glucose-Capillary: 163 mg/dL — ABNORMAL HIGH (ref 70–99)
Glucose-Capillary: 146 mg/dL — ABNORMAL HIGH (ref 70–99)
Glucose-Capillary: 190 mg/dL — ABNORMAL HIGH (ref 70–99)
Glucose-Capillary: 159 mg/dL — ABNORMAL HIGH (ref 70–99)

## 2020-03-24 MED ORDER — ROSUVASTATIN CALCIUM 5 MG PO TABS
10.0000 mg | ORAL_TABLET | Freq: Every day | ORAL | Status: DC
  Administered 2020-03-25: 10 mg
  Filled 2020-03-24: qty 2

## 2020-03-24 MED ORDER — CLOPIDOGREL BISULFATE 75 MG PO TABS
75.0000 mg | ORAL_TABLET | Freq: Every day | ORAL | Status: DC
  Filled 2020-03-24: qty 1

## 2020-03-24 NOTE — Progress Notes (Signed)
Palliative Medicine Inpatient Follow Up Note  Reason for consult:  Goals of Care "elderly lady with multiple strokes, minimally responsive, goals of care."  HPI:  Per intake H&P --> 86 year old lady with prior history of hypertension, hyperlipidemia, type 2 diabetes, embolic strokes last in October 2021 with residual left hand weakness, had Covid infection in January 2020, pulmonary embolus, hypothyroidism, GERD, hyperlipidemia presents to ED for altered mental status.  Palliative care was consulted to aid in goals of care conversations.  Today's Discussion (03/24/2020):  *Please note that this is a verbal dictation therefore any spelling or grammatical errors are due to the "[employer]" system interpretation.  Chart reviewed.   I met with Hannah Fischer at bedside this morning. He shares that Hannah Fischer was more alert yesterday - she was able to open her eyes and did mumble some words to him. We reviewed the her prognosis remains guarded. We focused a lot of our conversation on whether or not Hannah Fischer would be able to safely swallow again. I shared that speech therapy continue to see and evaluate her for this reason. We discussed if she were unable to swallow if she would ever desire a long term G-Tube which Hannah Fischer shared he would have to think about and discuss with his son and daughter. I asked him if we may all set up a meeting as I am concerned about her long term trajectory as it stands.   We reviewed either continuing the aggressive measures we are presently providing or transitioning our focus to comfort. Hannah Fischer would like to arrange a meeting time and ask for his son and daughter so come into the hospital so that we can all meet in person. I shared this is often a good idea as it would also give his children an idea in terms of where her health stands right now.  We reviewed and completed a MOST form as below:  Cardiopulmonary Resuscitation: Do Not Attempt Resuscitation (DNR/No CPR)   Medical Interventions: Limited Additional Interventions: Use medical treatment, IV fluids and cardiac monitoring as indicated, DO NOT USE intubation or mechanical ventilation. May consider use of less invasive airway support such as BiPAP or CPAP. Also provide comfort measures. Transfer to the hospital if indicated. Avoid intensive care.   Antibiotics: Determine use of limitation of antibiotics when infection occurs  IV Fluids: IV fluids for a defined trial period  Feeding Tube: Feeding tube for a defined trial period   Questions and concerns addressed   Objective Assessment: Vital Signs Vitals:   03/24/20 0436 03/24/20 0801  BP: (!) 161/83 (!) 159/70  Pulse: 70 71  Resp:    Temp: 98 F (36.7 C) 97.7 F (36.5 C)  SpO2: 100% 97%    Intake/Output Summary (Last 24 hours) at 03/24/2020 1114 Last data filed at 03/24/2020 1610 Gross per 24 hour  Intake 1414.38 ml  Output 1655 ml  Net -240.62 ml   Last Weight  Most recent update: 03/23/2020  4:11 AM   Weight  79.3 kg (174 lb 13.2 oz)           Gen:  Elderly F in NAD HEENT:Coretrack in place, moist mucous membranes CV: Regular rate and rhythm  PULM: RA ABD: soft/nontender  EXT: No edema  Neuro:  Somnolent can follow basic commands though does not open eyes or speak  SUMMARY OF RECOMMENDATIONS DNAR/DNI  MOST Completed, paper copy placed onto the chart electric copy can be found in [facility]  DNR Form Completed, paper copy placed onto  the chart electric copy can be found in [employer]  Continue ongoing treatments to see if improvements can be made   Will coordinate a family meeting  Ongoing PMT support  Time Spent: 35 Greater than 50% of the time was spent in counseling and coordination of care ______________________________________________________________________________________ [place] Team Team Cell Phone: [phone] Please utilize secure chat with additional questions, if  there is no response within 30 minutes please call the above phone number  Palliative Medicine Team providers are available by phone from 7am to 7pm daily and can be reached through the team cell phone.  Should this patient require assistance outside of these hours, please call the patient's attending physician.

## 2020-03-24 NOTE — Progress Notes (Signed)
  Speech Language Pathology Treatment: Dysphagia  Patient Details Name: Hannah Fischer MRN: [id number] DOB: 01/16/1933 Today's Date: 03/24/2020 Time: 3762-8315 SLP Time Calculation (min) (ACUTE ONLY): 25 min  Assessment / Plan / Recommendation Clinical Impression  Despite Max multimodal cues and encouragement from SLP, pt was not arousable enough for PO trials. Pt responded to a few yes/no questions and shook her head when indicating that she did not want oral care to be provided. Due to pt's lethargic state, reduced oral opening, and reduced bolus awareness, she was only able to consume trace amounts of PO trials of thin and honey thick liquids, which resulted in immediate but intermittent coughs. Recommend that pt remain NPO until more alert and SLP will continue to f/u to assess oropharyngeal swallow.    HPI HPI: Hannah Fischer is a 87 y.o. female with bilateral medial thalamic and punctate left superior cerebellar infarcts. PMHx significant for multiple CVAs with most recent acute/subacute R frontal lobe and L parietal and occipital lobe infarcts ~3 months ago, residual L-sided hemiplegia, hypothyroidism, GERD, HTN, TIA, DM, PE, anxiety, HTN, and HLD.      SLP Plan  Continue with current plan of care       Recommendations  Diet recommendations: NPO Medication Administration: Via alternative means                Oral Care Recommendations: Oral care QID Follow up Recommendations: Skilled Nursing facility SLP Visit Diagnosis: Dysphagia, oropharyngeal phase (R13.12) Plan: Continue with current plan of care       GO                Zettie Cooley., SLP Student 03/24/2020, 12:56 PM

## 2020-03-24 NOTE — Plan of Care (Signed)
  Problem: Nutrition: Goal: Adequate nutrition will be maintained Outcome: Progressing   

## 2020-03-24 NOTE — Progress Notes (Signed)
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  At that she had gone to bed last night and in the morning he had trouble waking her up. In the ED she was noted to be responsive to pain.  A code stroke was called.  She was outside the window for TPA.  An emergent CT did not show any acute abnormality.  A CTA was performed and did not reveal any obvious stenosis. An MRI of the brain was performed on 2/13:Punctate acute infarction in the left superior cerebellumadjacent to the superior cerebellar peduncle. Bilateral thalamic acute infarction extending back into the mid brain. This is consistent with artery of Percheron infarction.  Subjective: More alert today.  No complaints.    Assessment & Plan:   Principal Problem:   Acute encephalopathy secondary to bilateral thalamic infarcts -Prior history of embolic infarcts -The patient currently has an NG tube and remains quite lethargic-she is able to moan when used try to speak with her -Neurology and the stroke team have seen her -Started on amantadine and Ritalin to help improve her lethargy- she remains quite lethargic today -Due to her lethargy, she has a cor track and is receiving Osmolite -Palliative care has been consulted and the family would like to continue aggressive care but does not want CPR or shocks and thus she is a DNR -As of today, she is more alert-we will wait on another speech eval to see if we can initiate a diet for her -I appreciate palliative care following and speaking with family about the goals of care  Active Problems: Grade 2 diastolic heart failure-chronic -2D echo during this admission reveals grade 2  diastolic dysfunction  CKD 3A- AKI due to dehydration -Baseline creatinine appears to be 1.0-1.3 -Creatinine is steadily rising and is now 1.85 along with a rise in BUN from 23-32 -started free water per tube and watching I & O closely to prevent heart failure exacerbation -Creatinine noted to rise from 1.85-2.05 along with a rise in BUN -IV fluid bolus given yesterday and free water via tube feeds increased his had more urine output over the past 24 hours-continue to follow creatinine levels without any change for now    Diabetes mellitus 2 -Continue on insulin sliding scale every 4 hours while she is on tube feeds Component Value Date/Time   HGBA1C 7.6 (H) 03/18/2020 1936      Hypothyroidism -Continue Synthroid per tube    Time spent in minutes: 35 DVT prophylaxis: Heparin Code Status: DO NOT RESUSCITATE Family Communication: Husband Level of Care: Level of care: Progressive Disposition Plan:  Status is: Inpatient  Remains inpatient appropriate because:IV treatments appropriate due to intensity of illness or inability to take PO   Dispo: The patient is from: Home              Anticipated d/c is to: To be determined               Anticipated d/c date is: > 3 days              Patient currently is not medically stable to d/c.   Difficult to place patient No   Consultants:   Neurology Procedures:  Cor trac Antimicrobials:  Anti-infectives (From admission, onward)   Start     Dose/Rate Route Frequency Ordered Stop   03/18/20 1900  azithromycin (ZITHROMAX) 500 mg in sodium chloride 0.9 % 250 mL IVPB  Status:  Discontinued        500 mg 250 mL/hr over 60 Minutes Intravenous Every 24 hours 03/18/20 1830 03/19/20 1106   03/18/20 1900  cefTRIAXone (ROCEPHIN) 2 g in sodium chloride 0.9 % 100 mL IVPB  Status:  Discontinued        2 g 200 mL/hr over 30 Minutes Intravenous Every 24 hours 03/18/20 1830 03/19/20 1106       Objective: Vitals:   03/23/20 2029 03/23/20  2353 03/24/20 0436 03/24/20 0801  BP: (!) 174/71 (!) 158/91 (!) 161/83 (!) 159/70  Pulse: 73 71 70 71  Resp: 18 16    Temp: 98.6 F (37 C) 98.3 F (36.8 C) 98 F (36.7 C) 97.7 F (36.5 C)  TempSrc: Oral Oral Oral Oral  SpO2: 98%  100% 97%  Weight:      Height:        Intake/Output Summary (Last 24 hours) at 03/24/2020 1101 Last data filed at 03/24/2020 [id number] Gross per 24 hour  Intake 1414.38 ml  Output 1655 ml  Net -240.62 ml   Filed Weights   03/19/20 0030 03/19/20 0400 03/23/20 0349  Weight: 80.2 kg 80.2 kg 79.3 kg    Examination: General exam: Appears comfortable -awakens now when she is called  -tries to respond to questions and smiles HEENT: PERRLA, oral mucosa moist, no sclera icterus or thrush-cor trac is present Respiratory system: Clear to auscultation. Respiratory effort normal. Cardiovascular system: S1 & S2 heard, regular rate and rhythm Gastrointestinal system: Abdomen soft, non-tender, nondistended. Normal bowel sounds   Central nervous system:  Does not seem oriented follow commands.  Moves her hands and her feet on command.   Extremities: No cyanosis, clubbing or edema Skin: No rashes or ulcers Psychiatry:  Mood & affect appropriate.   Data Reviewed: I have personally reviewed following labs and imaging studies  CBC: Recent Labs  Lab 03/18/20 1420 03/18/20 1719 03/18/20 1932 03/19/20 1128 03/20/20 0504 03/21/20 0534  WBC 3.8*  --   --  7.2 6.2 5.6  NEUTROABS 1.9  --   --   --   --   --   HGB 11.8* 12.9 12.2 12.8 13.4 13.0  HCT 37.7 38.0 36.0 39.8 41.5 38.8  MCV 101.9*  --   --  98.8 100.0 99.0  PLT 182  --   --  213 186 187   Basic Metabolic Panel: Recent Labs  Lab 03/18/20 1936 03/19/20 1128 03/20/20 0504 03/20/20 1640 03/21/20 0534 03/21/20 1701 03/22/20 0406 03/23/20 0333 03/24/20 0310  NA  --    < > 141  --  141  --  143 142 137  K  --    < > 4.2  --  3.9  --  4.4 4.9 5.0  CL  --    < > 104  --  105  --  107 106 101  CO2  --    <  > 25  --  25  --  27 23 24   GLUCOSE  --    < > 144*  --  191*  --  193* 194* 186*  BUN  --    < > 26*  --  27*  --  32* 39* 40*  CREATININE  --    < >  1.72*  --  1.75*  --  1.85* 2.04* 2.05*  CALCIUM  --    < > 8.8*  --  8.9  --  8.9 9.1 9.0  MG 1.7  --   --  2.1 2.0 2.1 2.2  --   --   PHOS 3.6  --   --  3.9 3.9 3.9 3.9  --   --    < > = values in this interval not displayed.   GFR: Estimated Creatinine Clearance: 20 mL/min (A) (by C-G formula based on SCr of 2.05 mg/dL (H)). Liver Function Tests: Recent Labs  Lab 03/18/20 1420  AST 20  ALT 11  ALKPHOS 56  BILITOT 0.6  PROT 5.5*  ALBUMIN 2.9*   No results for input(s): LIPASE, AMYLASE in the last 168 hours. Recent Labs  Lab 03/18/20 1615  AMMONIA 24   Coagulation Profile: Recent Labs  Lab 03/18/20 1420  INR 1.0   Cardiac Enzymes: No results for input(s): CKTOTAL, CKMB, CKMBINDEX, TROPONINI in the last 168 hours. BNP (last 3 results) No results for input(s): PROBNP in the last 8760 hours. HbA1C: No results for input(s): HGBA1C in the last 72 hours. CBG: Recent Labs  Lab 03/23/20 1957 03/23/20 2102 03/23/20 2352 03/24/20 0430 03/24/20 0801  GLUCAP 160* 153* 161* 185* 183*   Lipid Profile: No results for input(s): CHOL, HDL, LDLCALC, TRIG, CHOLHDL, LDLDIRECT in the last 72 hours. Thyroid Function Tests: No results for input(s): TSH, T4TOTAL, FREET4, T3FREE, THYROIDAB in the last 72 hours. Anemia Panel: No results for input(s): VITAMINB12, FOLATE, FERRITIN, TIBC, IRON, RETICCTPCT in the last 72 hours. Urine analysis:    Component Value Date/Time   COLORURINE YELLOW 03/18/2020 1647   APPEARANCEUR CLEAR 03/18/2020 1647   LABSPEC 1.010 03/18/2020 1647   PHURINE 5.5 03/18/2020 1647   GLUCOSEU NEGATIVE 03/18/2020 1647   HGBUR TRACE (A) 03/18/2020 1647   BILIRUBINUR NEGATIVE 03/18/2020 1647   KETONESUR NEGATIVE 03/18/2020 1647   PROTEINUR NEGATIVE 03/18/2020 1647   UROBILINOGEN 0.2 11/22/2013 0945   NITRITE  NEGATIVE 03/18/2020 1647   LEUKOCYTESUR MODERATE (A) 03/18/2020 1647   Sepsis Labs: @LABRCNTIP (procalcitonin:4,lacticidven:4) ) Recent Results (from the past 240 hour(s))  Resp Panel by RT-PCR (Flu A&B, Covid) Nasopharyngeal Swab     Status: None   Collection Time: 03/18/20  3:10 PM   Specimen: Nasopharyngeal Swab; Nasopharyngeal(NP) swabs in vial transport medium  Result Value Ref Range Status   SARS Coronavirus 2 by RT PCR NEGATIVE NEGATIVE Final    Comment: (NOTE) SARS-CoV-2 target nucleic acids are NOT DETECTED.  The SARS-CoV-2 RNA is generally detectable in upper respiratory specimens during the acute phase of infection. The lowest concentration of SARS-CoV-2 viral copies this assay can detect is 138 copies/mL. A negative result does not preclude SARS-Cov-2 infection and should not be used as the sole basis for treatment or other patient management decisions. A negative result may occur with  improper specimen collection/handling, submission of specimen other than nasopharyngeal swab, presence of viral mutation(s) within the areas targeted by this assay, and inadequate number of viral copies(<138 copies/mL). A negative result must be combined with clinical observations, patient history, and epidemiological information. The expected result is Negative.  Fact Sheet for Patients:  05/16/20  Fact Sheet for Healthcare Providers:  BloggerCourse.com  This test is no t yet approved or cleared by the [employer] FDA and  has been authorized for detection and/or diagnosis of SARS-CoV-2 by FDA under an Emergency Use Authorization (EUA). This EUA will remain  in effect (meaning this test can be used) for the duration of the COVID-19 declaration under Section 564(b)(1) of the Act, 21 U.S.C.section 360bbb-3(b)(1), unless the authorization is terminated  or revoked sooner.       Influenza A by PCR NEGATIVE NEGATIVE Final    Influenza B by PCR NEGATIVE NEGATIVE Final    Comment: (NOTE) The Xpert Xpress SARS-CoV-2/FLU/RSV plus assay is intended as an aid in the diagnosis of influenza from Nasopharyngeal swab specimens and should not be used as a sole basis for treatment. Nasal washings and aspirates are unacceptable for Xpert Xpress SARS-CoV-2/FLU/RSV testing.  Fact Sheet for Patients: BloggerCourse.com  Fact Sheet for Healthcare Providers: SeriousBroker.it  This test is not yet approved or cleared by the [place] FDA and has been authorized for detection and/or diagnosis of SARS-CoV-2 by FDA under an Emergency Use Authorization (EUA). This EUA will remain in effect (meaning this test can be used) for the duration of the COVID-19 declaration under Section 564(b)(1) of the Act, 21 U.S.C. section 360bbb-3(b)(1), unless the authorization is terminated or revoked.  Performed at [facility], [address], [place], [place] [address]   Blood culture (routine x 2)     Status: None   Collection Time: 03/18/20  3:11 PM   Specimen: BLOOD  Result Value Ref Range Status   Specimen Description BLOOD SITE NOT SPECIFIED  Final   Special Requests   Final    BOTTLES DRAWN AEROBIC AND ANAEROBIC Blood Culture results may not be optimal due to an inadequate volume of blood received in culture bottles   Culture   Final    NO GROWTH 5 DAYS Performed at [facility], [address] [place], [place] [address]    Report Status 03/23/2020 FINAL  Final  Blood culture (routine x 2)     Status: None   Collection Time: 03/18/20  3:11 PM   Specimen: BLOOD  Result Value Ref Range Status   Specimen Description BLOOD SITE NOT SPECIFIED  Final   Special Requests   Final    BOTTLES DRAWN AEROBIC AND ANAEROBIC Blood Culture results may not be optimal due to an inadequate volume of blood received in culture bottles   Culture   Final    NO GROWTH 5 DAYS Performed  at [facility], [address], [place], [place] [address]    Report Status 03/23/2020 FINAL  Final  Culture, Urine     Status: Abnormal   Collection Time: 03/18/20  5:59 PM   Specimen: Urine, Catheterized  Result Value Ref Range Status   Specimen Description URINE, CATHETERIZED  Final   Special Requests Normal  Final   Culture (A)  Final    50,000 COLONIES/mL GROUP B STREP(S.AGALACTIAE)ISOLATED TESTING AGAINST S. AGALACTIAE NOT ROUTINELY PERFORMED DUE TO PREDICTABILITY OF AMP/PEN/VAN SUSCEPTIBILITY. Performed at [facility], [address], [place], [place] [address]    Report Status 03/19/2020 FINAL  Final  MRSA PCR Screening     Status: Abnormal   Collection Time: 03/18/20  6:35 PM   Specimen: Nasal Mucosa; Nasopharyngeal  Result Value Ref Range Status   MRSA by PCR POSITIVE (A) NEGATIVE Final    Comment:        The GeneXpert MRSA Assay (FDA approved for NASAL specimens only), is one component of a comprehensive MRSA colonization surveillance program. It is not intended to diagnose MRSA infection nor to guide or monitor treatment for MRSA infections. RESULT CALLED TO, READ BACK  BY AND VERIFIED WITH: [facility] RN AT 2041 03/18/2020 MITCHELL,L Performed at [facility], [address], [place], [place] [address]          Radiology Studies: No results found.    Scheduled Meds: . amantadine  100 mg Per Tube BID  . aspirin  81 mg Per Tube Daily   Or  . aspirin  300 mg Rectal Daily  . chlorhexidine  15 mL Mouth Rinse BID  . Chlorhexidine Gluconate Cloth  6 each Topical Daily  . feeding supplement (PROSource TF)  45 mL Per Tube TID  . free water  200 mL Per Tube Q4H  . heparin  5,000 Units Subcutaneous Q8H  . insulin aspart  0-9 Units Subcutaneous Q4H  . levothyroxine  112 mcg Per Tube QAC breakfast  . mouth rinse  15 mL Mouth Rinse q12n4p  . methylphenidate  5 mg Per Tube BID WC  . rosuvastatin  5 mg Per Tube Daily  . sodium chloride flush  3  mL Intravenous Once   Continuous Infusions: . sodium chloride 10 mL/hr at 03/24/20 0305  . feeding supplement (OSMOLITE 1.2 CAL) 45 mL/hr at 03/23/20 1900     LOS: 6 days      Calvert Cantor, MD [facility] Pager: [email or web address] 03/24/2020, 11:01 AM

## 2020-03-24 NOTE — Progress Notes (Signed)
Stroke Team Progress Note  SUBJECTIVE Patient neuro status is slightly improving today.  She opens her eyes partially.  She spoke a few words to the nurse.  She follows occasional midline commands for me today.   Vital signs are stable .  BMP today shows stable creatinine 2.04.   She is on amantadine 100 mg twice daily and ritalin 5 mg twice daily but has not been effective.  Husband is at the bedside.  Speech therapy plans to see the patient later today for swallow eval.   OBJECTIVE Most recent Vital Signs: Temp: 98.7 F (37.1 C) (02/18 1300) Temp Source: Axillary (02/18 1300) BP: 140/81 (02/18 1300) Pulse Rate: 62 (02/18 1300) Respiratory Rate: 16 O2 Saturdation: 100%  CBG (last 3)  Recent Labs    03/24/20 0430 03/24/20 0801 03/24/20 1309  GLUCAP 185* 183* 163*       Studies:  CT head no acute abnormalities MRI bilateral medial thalamic punctate left superior cerebellar infarct CTA suboptimal due to contrast timing but no LVO.  Diminutive flow in the right vertebral artery ECHO on 11/24/2019 ejection fraction 6065%.  No wall motion abnormalities. LDL 155 mg percent HbA1c 7.6 EEG pending  Physical Exam:     Frail elderly Caucasian lady is lying comfortably in bed.  Not in distress. . Afebrile. Head is nontraumatic. Neck is supple without bruit.    Cardiac exam no murmur or gallop. Lungs are clear to auscultation. Distal pulses are well felt. Neurological Exam :  Patient remains sleepy.  But now open eyes partially She responds to sternal rub with partially opening her eyes and muttering in few words but not able to speak sentences.  She was able to stick out her tongue and move her extremities to command. She moves right upper and both lower extremities purposefully there is movement against gravity in left upper extremity but appears to have some weakness.  Slight dysconjugate gaze with both eyes deviated downwards at rest with left eye hypertropia both pupils are 3 to 4 mm  sluggishly reactive.  Corneal reflexes are brisk Doll's eye movements are sluggish.  She does not blink to threat on either side.  Face appears symmetric.  Plantars are both upgoing.  Gait not tested ASSESSMENT [name] is a 87 y.o. 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  Tiny left cerebellar punctate infarct may also be a concurrent lacune.  However she has had previous history of embolic as well as small vessel infarcts in October 21 hence strong suspicion for underlying paroxysmal A. fib.  Hospital day # 6  TREATMENT/PLAN Patient neurological exam is now showing slight improvement.  Continue amantadine 100 mg and Ritalin 5 mg twice daily.    Recommend swallow eval by speech therapy today.  Discussed with patient's husband at the bedside   and answered questions.  Discussed with Dr. Butler Denmark.  Greater than 50% time during the 25-minute visit was spent in counseling and coordination of care about her right thalamic infarct and decreases forcefulness discussion of treatment and answering questions.  Delia Heady, MD Medical Director [facility] Pager: [phone] 03/24/2020 1:36 PM

## 2020-03-25 ENCOUNTER — Inpatient Hospital Stay (HOSPITAL_COMMUNITY): Payer: Medicare Other

## 2020-03-25 DIAGNOSIS — Z8673 Personal history of transient ischemic attack (TIA), and cerebral infarction without residual deficits: Secondary | ICD-10-CM

## 2020-03-25 DIAGNOSIS — Z66 Do not resuscitate: Secondary | ICD-10-CM

## 2020-03-25 DIAGNOSIS — Z7189 Other specified counseling: Secondary | ICD-10-CM | POA: Diagnosis not present

## 2020-03-25 DIAGNOSIS — Z515 Encounter for palliative care: Secondary | ICD-10-CM | POA: Diagnosis not present

## 2020-03-25 DIAGNOSIS — G9340 Encephalopathy, unspecified: Secondary | ICD-10-CM | POA: Diagnosis not present

## 2020-03-25 LAB — GLUCOSE, CAPILLARY
Glucose-Capillary: 153 mg/dL — ABNORMAL HIGH (ref 70–99)
Glucose-Capillary: 218 mg/dL — ABNORMAL HIGH (ref 70–99)
Glucose-Capillary: 219 mg/dL — ABNORMAL HIGH (ref 70–99)
Glucose-Capillary: 234 mg/dL — ABNORMAL HIGH (ref 70–99)
Glucose-Capillary: 245 mg/dL — ABNORMAL HIGH (ref 70–99)
Glucose-Capillary: 259 mg/dL — ABNORMAL HIGH (ref 70–99)

## 2020-03-25 LAB — CBC
HCT: 34.8 % — ABNORMAL LOW (ref 36.0–46.0)
Hemoglobin: 11.2 g/dL — ABNORMAL LOW (ref 12.0–15.0)
MCH: 31.8 pg (ref 26.0–34.0)
MCHC: 32.2 g/dL (ref 30.0–36.0)
MCV: 98.9 fL (ref 80.0–100.0)
Platelets: 208 10*3/uL (ref 150–400)
RBC: 3.52 MIL/uL — ABNORMAL LOW (ref 3.87–5.11)
RDW: 13.1 % (ref 11.5–15.5)
WBC: 6.5 10*3/uL (ref 4.0–10.5)
nRBC: 0 % (ref 0.0–0.2)

## 2020-03-25 LAB — URINALYSIS, ROUTINE W REFLEX MICROSCOPIC
Bilirubin Urine: NEGATIVE
Glucose, UA: 50 mg/dL — AB
Ketones, ur: NEGATIVE mg/dL
Nitrite: NEGATIVE
Protein, ur: NEGATIVE mg/dL
RBC / HPF: >50 RBC/hpf — ABNORMAL HIGH (ref 0–5)
Specific Gravity, Urine: 1.01 (ref 1.005–1.030)
pH: 5 (ref 5.0–8.0)

## 2020-03-25 LAB — BASIC METABOLIC PANEL
Anion gap: 10 (ref 5–15)
BUN: 43 mg/dL — ABNORMAL HIGH (ref 8–23)
CO2: 24 mmol/L (ref 22–32)
Calcium: 9.7 mg/dL (ref 8.9–10.3)
Chloride: 102 mmol/L (ref 98–111)
Creatinine, Ser: 2.15 mg/dL — ABNORMAL HIGH (ref 0.44–1.00)
GFR, Estimated: 22 mL/min — ABNORMAL LOW (ref >60)
Glucose, Bld: 297 mg/dL — ABNORMAL HIGH (ref 70–99)
Potassium: 4.8 mmol/L (ref 3.5–5.1)
Sodium: 136 mmol/L (ref 135–145)

## 2020-03-25 MED ORDER — AMLODIPINE BESYLATE 5 MG PO TABS
5.0000 mg | ORAL_TABLET | Freq: Every day | ORAL | Status: DC
  Administered 2020-03-25 – 2020-03-26 (×2): 5 mg via ORAL
  Filled 2020-03-25 (×2): qty 1

## 2020-03-25 MED ORDER — AMANTADINE HCL 50 MG/5ML PO SOLN
50.0000 mg | Freq: Every day | ORAL | Status: DC
  Administered 2020-03-27 – 2020-04-03 (×8): 50 mg
  Filled 2020-03-25 (×10): qty 5

## 2020-03-25 MED ORDER — CLOPIDOGREL BISULFATE 75 MG PO TABS
75.0000 mg | ORAL_TABLET | Freq: Every day | ORAL | Status: DC
  Administered 2020-03-25 – 2020-04-10 (×17): 75 mg
  Filled 2020-03-25 (×17): qty 1

## 2020-03-25 NOTE — Progress Notes (Signed)
Pt confused and attempting to pull out all tubes. Nothing ordered IV except NS at 10-20 ml/hr. Spoke to the primary RN who states she will contact the attending MD for possible order to d/c IVF and not require IV access.

## 2020-03-25 NOTE — Consult Note (Signed)
I have been asked to see the patient by Dr. Calvert Cantor, for evaluation and management of right hydronephrosis.  History of present illness: 87 year old female who was admitted to the hospital after suffering from a stroke.  She has been mostly nonparticipatory throughout this hospitalization likely secondary to the metabolic encephalopathy.  Thus she had no real complaints.  However, it was noted during her hospitalization that her kidney function was worsening despite IV resuscitation.  Thus a renal ultrasound was ordered.  This demonstrated marked dilation of the right kidney with a documented to stone at the UPJ.  History limited but the patient denies any pain.  She is aphasic and unable to make any words.  She appears pleasant and is mumbling with a smile.  Review of systems: A 12 point comprehensive review of systems was obtained and is negative unless otherwise stated in the history of present illness.  Patient Active Problem List   Diagnosis Date Noted  . History of embolic stroke 03/22/2020  . Acute encephalopathy 03/18/2020  . Gait abnormality 01/19/2020  . Chest pressure 01/19/2020  . Cerebral amyloid angiopathy (CODE)   . Stroke (HCC) 11/24/2019  . AKI (acute kidney injury) (HCC) 11/24/2019  . Spinal stenosis of lumbar region with neurogenic claudication 10/26/2019  . Abnormality of gait 08/02/2019  . Cerebrovascular accident (CVA) (HCC) 07/22/2019  . Dizziness 06/15/2019  . Brain aneurysm 06/15/2019  . Chronic pain syndrome 04/29/2019  . Chronic right-sided low back pain without sciatica 04/12/2019  . Arthritis of shoulder region, left, degenerative 12/03/2013  . Nonspecific abnormal electrocardiogram (ECG) (EKG) 10/15/2013  . Other and unspecified angina pectoris 10/15/2013  . TIA (transient ischemic attack) 11/15/2011  . HTN (hypertension) 11/15/2011  . Diabetes mellitus (HCC) 11/15/2011  . Hypothyroidism 11/15/2011  . Dyslipidemia 11/15/2011  . GERD  (gastroesophageal reflux disease) 11/15/2011  . Pyuria 11/15/2011    No current facility-administered medications on file prior to encounter.   Current Outpatient Medications on File Prior to Encounter  Medication Sig Dispense Refill  . amLODipine (NORVASC) 5 MG tablet Take 5 mg by mouth every morning.    . B Complex Vitamins (VITAMIN B COMPLEX) TABS Take 1 tablet by mouth daily after breakfast.    . Cholecalciferol (VITAMIN D3) 2000 UNITS TABS Take 2,000 Units by mouth daily.     . clopidogrel (PLAVIX) 75 MG tablet Take 1 tablet (75 mg total) by mouth daily with breakfast. 30 tablet 1  . DULoxetine (CYMBALTA) 30 MG capsule TAKE 1 CAPSULE(30 MG) BY MOUTH DAILY (Patient taking differently: Take 30 mg by mouth daily.) 30 capsule 2  . levothyroxine (SYNTHROID) 112 MCG tablet Take 112 mcg by mouth daily before breakfast.    . losartan (COZAAR) 100 MG tablet Take 100 mg by mouth daily.    . nitrofurantoin (MACRODANTIN) 100 MG capsule Take 100 mg by mouth daily.    . Omega-3 Fatty Acids (FISH OIL) 1000 MG CAPS Take 1,000 mg by mouth 2 (two) times daily.    . pioglitazone (ACTOS) 45 MG tablet Take 45 mg by mouth daily.    . propranolol (INDERAL) 40 MG tablet Take 40 mg by mouth daily.   1  . traZODone (DESYREL) 50 MG tablet TAKE 1 TABLET BY MOUTH AT BEDTIME (Patient taking differently: Take 50 mg by mouth at bedtime.) 90 tablet 0  . levothyroxine (SYNTHROID, LEVOTHROID) 100 MCG tablet Take 1 tablet (100 mcg total) by mouth daily. (Patient not taking: No sig reported) 30 tablet 1  . methocarbamol (ROBAXIN)  500 MG tablet Take 1 tablet (500 mg total) by mouth every 8 (eight) hours as needed for muscle spasms. (Patient not taking: No sig reported) 90 tablet 1  . rosuvastatin (CRESTOR) 5 MG tablet Take 1 tablet (5 mg total) by mouth 2 (two) times a week for 30 doses. 30 tablet 0    Past Medical History:  Diagnosis Date  . Abnormal nuclear stress test    2005, normal cath Dr. Elease Hashimoto, normal stress  test 2012 Dr. Eldridge Dace  . Anemia    chronic  . Anxiety   . Arthritis    djd  . Arthritis of shoulder region, left, degenerative 12/03/2013  . Blood transfusion 1979   for anemia  . Chest pain    evaluated @ [facility] cardiology; had a stress test 2012  . Chronic pain syndrome 04/29/2019  . Chronic right-sided low back pain without sciatica 04/12/2019  . Complication of anesthesia    unable to breathe lying  flat on back  . Diabetes mellitus    type 2  niddm x 25 yrs  . Diabetes mellitus (HCC) 11/15/2011  . Diverticular disease   . DJD (degenerative joint disease)    lumbar and cervical  . GERD (gastroesophageal reflux disease)   . Hyperlipemia   . Hypertension    on medication  . Hypothyroidism    takes levoxyl  . Irritable bowel syndrome   . 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 Neuromuscular disorder (HCC)   . Neuropathy in diabetes (HCC)   . Nonspecific abnormal electrocardiogram (ECG) (EKG) 10/15/2013  . Other and unspecified angina pectoris    tests came back NOT heart related  . Pulmonary embolism (HCC) 06-2010   bilateral  . Pyuria 11/15/2011  . Seizures (HCC)    once, age 51  . TIA (transient ischemic attack) 11/15/2011    Past Surgical History:  Procedure Laterality Date  . BACK SURGERY    . BLEPHAROPLASTY  2012   rt eye  . CARDIAC CATHETERIZATION     had one 10/2013  . CHOLECYSTECTOMY    . EYE SURGERY  2007   cataract ext/ iol rt eye  . JOINT REPLACEMENT  2001,2005   both knees  . LEFT HEART CATHETERIZATION WITH CORONARY ANGIOGRAM N/A 10/18/2013   Procedure: LEFT HEART CATHETERIZATION WITH CORONARY ANGIOGRAM;  Surgeon: Corky Crafts, MD;  Location: [facility] CATH LAB;  Service: Cardiovascular;  Laterality: N/A;  . LUMBAR DISC SURGERY  1990  . REVERSE SHOULDER ARTHROPLASTY Left 12/03/2013   Procedure: LEFT SHOULDER REVERSE ARTHROPLASTY;  Surgeon: Verlee Rossetti, MD;  Location: [facility] OR;  Service: Orthopedics;  Laterality: Left;  . SHOULDER  HEMI-ARTHROPLASTY  12/25/2010   Procedure: SHOULDER HEMI-ARTHROPLASTY;  Surgeon: Cammy Copa;  Location: [facility] OR;  Service: Orthopedics;  Laterality: Right;    Social History   Tobacco Use  . Smoking status: Never Smoker  . Smokeless tobacco: Never Used  Substance Use Topics  . Alcohol use: No  . Drug use: No    Family History  Problem Relation Age of Onset  . Diabetes Mother   . Heart disease Mother   . Heart disease Sister   . Heart disease Brother     PE: Vitals:   03/25/20 0339 03/25/20 0500 03/25/20 0816 03/25/20 1237  BP: (!) 177/95  (!) 190/88 (!) 182/94  Pulse: (!) 107  86 97  Resp: 19  20 20   Temp: 98.4 F (36.9 C)  98.5 F (36.9 C) 99  F (37.2 C)  TempSrc: Oral  Oral Oral  SpO2: 98%  100% 100%  Weight:  104.6 kg    Height:       Patient appears to be in no acute distress  patient is alert but disoriented Atraumatic normocephalic head No cervical or supraclavicular lymphadenopathy appreciated No increased work of breathing, no audible wheezes/rhonchi Regular sinus rhythm/rate Abdomen is soft, nontender, nondistended, no CVA or suprapubic tenderness Lower extremities are symmetric without appreciable edema Grossly neurologically intact No identifiable skin lesions  Recent Labs    03/25/20 0648  WBC 6.5  HGB 11.2*  HCT 34.8*   Recent Labs    03/23/20 0333 03/24/20 0310 03/25/20 0648  NA 142 137 136  K 4.9 5.0 4.8  CL 106 101 102  CO2 23 24 24   GLUCOSE 194* 186* 297*  BUN 39* 40* 43*  CREATININE 2.04* 2.05* 2.15*  CALCIUM 9.1 9.0 9.7   No results for input(s): LABPT, INR in the last 72 hours. No results for input(s): LABURIN in the last 72 hours. Results for orders placed or performed during the hospital encounter of 03/18/20  Resp Panel by RT-PCR (Flu A&B, Covid) Nasopharyngeal Swab     Status: None   Collection Time: 03/18/20  3:10 PM   Specimen: Nasopharyngeal Swab; Nasopharyngeal(NP) swabs in vial transport medium  Result Value  Ref Range Status   SARS Coronavirus 2 by RT PCR NEGATIVE NEGATIVE Final    Comment: (NOTE) SARS-CoV-2 target nucleic acids are NOT DETECTED.  The SARS-CoV-2 RNA is generally detectable in upper respiratory specimens during the acute phase of infection. The lowest concentration of SARS-CoV-2 viral copies this assay can detect is 138 copies/mL. A negative result does not preclude SARS-Cov-2 infection and should not be used as the sole basis for treatment or other patient management decisions. A negative result may occur with  improper specimen collection/handling, submission of specimen other than nasopharyngeal swab, presence of viral mutation(s) within the areas targeted by this assay, and inadequate number of viral copies(<138 copies/mL). A negative result must be combined with clinical observations, patient history, and epidemiological information. The expected result is Negative.  Fact Sheet for Patients:  05/16/20  Fact Sheet for Healthcare Providers:  BloggerCourse.com  This test is no t yet approved or cleared by the [employer].[place] FDA and  has been authorized for detection and/or diagnosis of SARS-CoV-2 by FDA under an Emergency Use Authorization (EUA). This EUA will remain  in effect (meaning this test can be used) for the duration of the COVID-19 declaration under Section 564(b)(1) of the Act, 21 U.S.C.section 360bbb-3(b)(1), unless the authorization is terminated  or revoked sooner.       Influenza A by PCR NEGATIVE NEGATIVE Final   Influenza B by PCR NEGATIVE NEGATIVE Final    Comment: (NOTE) The Xpert Xpress SARS-CoV-2/FLU/RSV plus assay is intended as an aid in the diagnosis of influenza from Nasopharyngeal swab specimens and should not be used as a sole basis for treatment. Nasal washings and aspirates are unacceptable for Xpert Xpress SARS-CoV-2/FLU/RSV testing.  Fact Sheet for  Patients: [place]  Fact Sheet for Healthcare Providers: BloggerCourse.com  This test is not yet approved or cleared by the [employer]ious[employer]ker.[place] FDA and has been authorized for detection and/or diagnosis of SARS-CoV-2 by FDA under an Emergency Use Authorization (EUA). This EUA will remain in effect (meaning this test can be used) for the duration of the COVID-19 declaration under Section 564(b)(1) of the Act, 21 U.S.C. section 360bbb-3(b)(1), unless the  authorization is terminated or revoked.  Performed at [facility], [address] [place], [place] [address]   Blood culture (routine x 2)     Status: None   Collection Time: 03/18/20  3:11 PM   Specimen: BLOOD  Result Value Ref Range Status   Specimen Description BLOOD SITE NOT SPECIFIED  Final   Special Requests   Final    BOTTLES DRAWN AEROBIC AND ANAEROBIC Blood Culture results may not be optimal due to an inadequate volume of blood received in culture bottles   Culture   Final    NO GROWTH 5 DAYS Performed at [facility], [address] [place], [place] [address]    Report Status 03/23/2020 FINAL  Final  Blood culture (routine x 2)     Status: None   Collection Time: 03/18/20  3:11 PM   Specimen: BLOOD  Result Value Ref Range Status   Specimen Description BLOOD SITE NOT SPECIFIED  Final   Special Requests   Final    BOTTLES DRAWN AEROBIC AND ANAEROBIC Blood Culture results may not be optimal due to an inadequate volume of blood received in culture bottles   Culture   Final    NO GROWTH 5 DAYS Performed at [facility], [address] [place], [place] [address]    Report Status 03/23/2020 FINAL  Final  Culture, Urine     Status: Abnormal   Collection Time: 03/18/20  5:59 PM   Specimen: Urine, Catheterized  Result Value Ref Range Status   Specimen Description URINE, CATHETERIZED  Final   Special Requests Normal  Final   Culture (A)  Final     50,000 COLONIES/mL GROUP B STREP(S.AGALACTIAE)ISOLATED TESTING AGAINST S. AGALACTIAE NOT ROUTINELY PERFORMED DUE TO PREDICTABILITY OF AMP/PEN/VAN SUSCEPTIBILITY. Performed at [facility], [address] [place], [place] [address]    Report Status 03/19/2020 FINAL  Final  MRSA PCR Screening     Status: Abnormal   Collection Time: 03/18/20  6:35 PM   Specimen: Nasal Mucosa; Nasopharyngeal  Result Value Ref Range Status   MRSA by PCR POSITIVE (A) NEGATIVE Final    Comment:        The GeneXpert MRSA Assay (FDA approved for NASAL specimens only), is one component of a comprehensive MRSA colonization surveillance program. It is not intended to diagnose MRSA infection nor to guide or monitor treatment for MRSA infections. RESULT CALLED TO, READ BACK BY AND VERIFIED WITH: [facility],K RN AT 2041 03/18/2020 MITCHELL,L Performed at [facility], [address] [place], [place] [address]     Imaging: I reviewed the patient's ultrasound which was obtained today demonstrating a severe dilation or hydronephrosis of the right kidney with calcification at the UVJ.  I also compared this to a CT angiogram that was obtained in December that demonstrates no hydronephrosis of the right kidney, although less marked.  There is also stone in the lower pole of her kidney which is about 6 or 7 mm in size.  Imp: Right hydro seen on ultrasound today appears to be slightly worse then what was seen on CT scan in December.  Her left kidney is normal appearing although maybe slight atrophic.  Recommendations: Recommend stone protocol Ab/Pelv CT to compare with previous CT and ensure that shes not obstructed.  Fortunately shes not having pain.  Will follow.  Crist FatBenjamin W Veldon Wager

## 2020-03-25 NOTE — Consult Note (Signed)
Nephrology Consult   Requesting provider: Calvert Cantor Service requesting consult: Hospitalist Reason for consult: AKI on CKD 3 yea   Assessment/Recommendations: Hannah Fischer is a/an 87 y.o. female with a past medical history HTN, DM 2, CVA, hypothyroidism who present w/ stroke now complicated by AKI  AKI on CKD 3a: BL 1-1.2. Slowly progressive Crt rise now 2.1. Good UOP. Did receive contrast on 2/12 but unlikely having an effect this far out.  Urology feels the obstruction might be chronic but given the severity and the lack of other explanation for creatinine rise this is most likely reason for her AKI -Appears euvolemic for now; could give fluids as needed -Appreciate urology assistance -Follow-up CT which was ordered -Likely recommend intervention to relieve obstruction given there is not another obvious cause for her creatinine rise; may need nephrostomy tube -Continue monitor creatinine daily -Monitor ins and outs -Repeat urinalysis today  Hypertension: Blood pressure very elevated at this time.  Start amlodipine 5 mg daily  DM2: Management per primary team  Hypothyroidism: On supplementation  Encephalopathy/CVA: Minimal interaction.  Management per primary team  Recommendations conveyed to primary service.    Darnell Level [facility] 03/25/2020 2:04 PM   _____________________________________________________________________________________ CC: AKI  History of Present Illness: Hannah Fischer is a/an 87 y.o. female with a past medical history of HTN, HLD, DM 2, CVA who presents with CVA now consulted for AKI.  Patient is altered so history was obtained per chart review.  Patient originally presented on 2/12 with lethargy.  It was noted by her spouse that he was unable to wake her up in the morning.  She was taken to the emergency department where she was only responsive to pain.  An MRI demonstrated acute infarct concerning for stroke likely  embolic.  The patient was admitted for further management and evaluation.  Patient continues to be lethargic and has not had significant improvement in her overall clinical status.  Neurology and stroke team have been following with her.  Palliative care has been involved.  She is DNR at this time.  It appears her baseline creatinine is around 1-1.2 indicating mild chronic kidney disease.  Creatinine was last at baseline on 2/12 but since that time her creatinine is risen steadily.  She did receive contrast at that time.  She is continued to have good urine output.  Renal ultrasound demonstrated moderate to severe right hydronephrosis with a 1.2 cm calculus in the region of the renal pelvis.  Medications:  Current Facility-Administered Medications  Medication Dose Route Frequency Provider Last Rate Last Admin  . 0.9 %  sodium chloride infusion  250 mL Intravenous Continuous Stretch, Marveen Reeks, MD 10 mL/hr at 03/24/20 0305 Restarted at 03/24/20 0305  . amantadine (SYMMETREL) 50 MG/5ML solution 100 mg  100 mg Per Tube BID Micki Riley, MD   100 mg at 03/25/20 0841  . amLODipine (NORVASC) tablet 5 mg  5 mg Oral Daily Darnell Level, MD      . chlorhexidine (PERIDEX) 0.12 % solution 15 mL  15 mL Mouth Rinse BID Karie Fetch P, DO   15 mL at 03/25/20 0842  . Chlorhexidine Gluconate Cloth 2 % PADS 6 each  6 each Topical Daily Steffanie Dunn, DO   6 each at 03/25/20 [phone]  . clopidogrel (PLAVIX) tablet 75 mg  75 mg Per Tube Q breakfast Leander Rams, RPH   75 mg at 03/25/20 1055  . docusate (COLACE) 50 MG/5ML liquid 100  mg  100 mg Per Tube BID PRN Kathlen Mody, MD      . feeding supplement (OSMOLITE 1.2 CAL) liquid 1,000 mL  1,000 mL Per Tube Continuous Olalere, Adewale A, MD 45 mL/hr at 03/23/20 1900 Infusion Verify at 03/23/20 1900  . feeding supplement (PROSource TF) liquid 45 mL  45 mL Per Tube TID Olalere, Adewale A, MD   45 mL at 03/25/20 0841  . free water 200 mL  200 mL Per Tube Q4H Rizwan,  Saima, MD   200 mL at 03/25/20 1255  . heparin injection 5,000 Units  5,000 Units Subcutaneous Q8H Selmer Dominion B, NP   5,000 Units at 03/25/20 0603  . insulin aspart (novoLOG) injection 0-9 Units  0-9 Units Subcutaneous Q4H Selmer Dominion B, NP   3 Units at 03/25/20 1255  . levothyroxine (SYNTHROID) tablet 112 mcg  112 mcg Per Tube QAC breakfast Kathlen Mody, MD   112 mcg at 03/25/20 0608  . MEDLINE mouth rinse  15 mL Mouth Rinse q12n4p Karie Fetch P, DO   15 mL at 03/25/20 1256  . methylphenidate (RITALIN) tablet 5 mg  5 mg Per Tube BID WC Calvert Cantor, MD   5 mg at 03/25/20 1255  . polyethylene glycol (MIRALAX / GLYCOLAX) packet 17 g  17 g Per Tube Daily PRN Kathlen Mody, MD      . rosuvastatin (CRESTOR) tablet 10 mg  10 mg Per Tube Daily Marvel Plan, MD   10 mg at 03/25/20 0841  . sodium chloride flush (NS) 0.9 % injection 3 mL  3 mL Intravenous Once Milagros Loll, MD         ALLERGIES Atorvastatin, Ezetimibe-simvastatin, Lovastatin, Metformin hcl, Pravastatin, Codeine, Simvastatin, Sulfa antibiotics, and Sulfites  MEDICAL HISTORY Past Medical History:  Diagnosis Date  . Abnormal nuclear stress test    2005, normal cath Dr. Elease Hashimoto, normal stress test 2012 Dr. Eldridge Dace  . Anemia    chronic  . Anxiety   . Arthritis    djd  . Arthritis of shoulder region, left, degenerative 12/03/2013  . Blood transfusion 1979   for anemia  . Chest pain    evaluated @ [facility] cardiology; had a stress test 2012  . Chronic pain syndrome 04/29/2019  . Chronic right-sided low back pain without sciatica 04/12/2019  . Complication of anesthesia    unable to breathe lying  flat on back  . Diabetes mellitus    type 2  niddm x 25 yrs  . Diabetes mellitus (HCC) 11/15/2011  . Diverticular disease   . DJD (degenerative joint disease)    lumbar and cervical  . GERD (gastroesophageal reflux disease)   . Hyperlipemia   . Hypertension    on medication  . Hypothyroidism    takes levoxyl  .  Irritable bowel syndrome   . 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 Neuromuscular disorder (HCC)   . Neuropathy in diabetes (HCC)   . Nonspecific abnormal electrocardiogram (ECG) (EKG) 10/15/2013  . Other and unspecified angina pectoris    tests came back NOT heart related  . Pulmonary embolism (HCC) 06-2010   bilateral  . Pyuria 11/15/2011  . Seizures (HCC)    once, age 23  . TIA (transient ischemic attack) 11/15/2011     SOCIAL HISTORY Social History   Socioeconomic History  . Marital status: Married    Spouse name: Not on file  . Number of children: Not on file  . Years of education: Not  on file  . Highest education level: Not on file  Occupational History  . Not on file  Tobacco Use  . Smoking status: Never Smoker  . Smokeless tobacco: Never Used  Substance and Sexual Activity  . Alcohol use: No  . Drug use: No  . Sexual activity: Never    Birth control/protection: Post-menopausal  Other Topics Concern  . Not on file  Social History Narrative  . Not on file   Social Determinants of Health   Financial Resource Strain: Not on file  Food Insecurity: Not on file  Transportation Needs: Not on file  Physical Activity: Not on file  Stress: Not on file  Social Connections: Not on file  Intimate Partner Violence: Not on file     FAMILY HISTORY Family History  Problem Relation Age of Onset  . Diabetes Mother   . Heart disease Mother   . Heart disease Sister   . Heart disease Brother       Review of Systems: Unable to obtain due to the patient's altered mental status  Physical Exam: Vitals:   03/25/20 0816 03/25/20 1237  BP: (!) 190/88 (!) 182/94  Pulse: 86 97  Resp: 20 20  Temp: 98.5 F (36.9 C) 99 F (37.2 C)  SpO2: 100% 100%   No intake/output data recorded.  Intake/Output Summary (Last 24 hours) at 03/25/2020 1404 Last data filed at 03/25/2020 0550 Gross per 24 hour  Intake 329.97 ml  Output 1000 ml  Net -670.03 ml   General:  ill-appearing, lying in bed, agitated HEENT: anicteric sclera, oropharynx clear without lesions CV: Normal rate, 2 out of 6 systolic murmur present, no lower extremity edema Lungs: clear to auscultation bilaterally, normal work of breathing Abd: soft, non-tender, non-distended Skin: no visible lesions or rashes Psych: Altered, unable to answer questions Musculoskeletal: no obvious deformities Neuro: Unable to perform exam due to the patient's altered mental status and inability to follow commands, moves all 4 extremities spontaneously  Test Results Reviewed Lab Results  Component Value Date   NA 136 03/25/2020   K 4.8 03/25/2020   CL 102 03/25/2020   CO2 24 03/25/2020   BUN 43 (H) 03/25/2020   CREATININE 2.15 (H) 03/25/2020   GFR 70.52 10/15/2013   CALCIUM 9.7 03/25/2020   ALBUMIN 2.9 (L) 03/18/2020   PHOS 3.9 03/22/2020     I have reviewed all relevant outside healthcare records related to the patient's current hospitalization

## 2020-03-25 NOTE — Progress Notes (Addendum)
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  At that she had gone to bed last night and in the morning he had trouble waking her up. In the ED she was noted to be responsive to pain.  A code stroke was called.  She was outside the window for TPA.  An emergent CT did not show any acute abnormality.  A CTA was performed and did not reveal any obvious stenosis. An MRI of the brain was performed on 2/13:Punctate acute infarction in the left superior cerebellumadjacent to the superior cerebellar peduncle. Bilateral thalamic acute infarction extending back into the mid brain. This is consistent with artery of Percheron infarction.  Subjective: No complaints. She is becoming more alert every day and has been combative overnight.     Assessment & Plan:   Principal Problem:   Acute encephalopathy secondary to bilateral thalamic infarcts -Prior history of embolic infarcts -The patient currently has an NG tube and remains quite lethargic-she is able to moan when used try to speak with her -Neurology and the stroke team have seen her -Started on amantadine and Ritalin to help improve her lethargy- she remains quite lethargic today -Due to her lethargy, she has a cor track and is receiving Osmolite -Palliative care has been consulted and the family would like to continue aggressive care but does not want CPR or shocks and thus she is a DNR -As she is now alert, we will wait for SLP to let us know if we can initiate a diet for her -I appreciate palliative care following and speaking with family about the goals of care  Active Problems: Acute metabolic encephalopathy - waking up from her brain  injury and agitated- will ask for a sitter she is in soft wrist restrains for now  CKD 3A- AKI due to dehydration -Baseline creatinine appears to be 1.0-1.3 -Creatinine is steadily rising and is now 1.85 along with a rise in BUN from 23-32 -started free water per tube and watching I & O closely to prevent heart failure exacerbation -Creatinine noted to be steadily rising- obtain renal ultrasound today- see above - have consulted nephrology today   Hydronephrosis - renal ultrasound reveals moderate to severe hydronephrosis on the right with possible UP junction stone- have paged Dr Marlou Porch , urology  Grade 2 diastolic heart failure-chronic -2D echo during this admission reveals grade 2 diastolic dysfunction     Diabetes mellitus 2 -Continue on insulin sliding scale every 4 hours while she is on tube feeds Component Value Date/Time   HGBA1C 7.6 (H) 03/18/2020 1936      Hypothyroidism -Continue Synthroid per tube    Time spent in minutes: 35 DVT prophylaxis: Heparin Code Status: DO NOT RESUSCITATE Family Communication: Husband Level of Care: Level of care: Progressive Disposition Plan:  Status is: Inpatient  Remains inpatient appropriate because:IV treatments appropriate due to intensity of illness or inability to take PO   Dispo: The patient is from: Home              Anticipated d/c is to: To be determined               Anticipated d/c date is: > 3 days  Patient currently is not medically stable to d/c.   Difficult to place patient No   Consultants:   Neurology Procedures:   Cor trac Antimicrobials:  Anti-infectives (From admission, onward)   Start     Dose/Rate Route Frequency Ordered Stop   03/18/20 1900  azithromycin (ZITHROMAX) 500 mg in sodium chloride 0.9 % 250 mL IVPB  Status:  Discontinued        500 mg 250 mL/hr over 60 Minutes Intravenous Every 24 hours 03/18/20 1830 03/19/20 1106   03/18/20 1900  cefTRIAXone (ROCEPHIN) 2 g in sodium  chloride 0.9 % 100 mL IVPB  Status:  Discontinued        2 g 200 mL/hr over 30 Minutes Intravenous Every 24 hours 03/18/20 1830 03/19/20 1106       Objective: Vitals:   03/24/20 2319 03/25/20 0339 03/25/20 0500 03/25/20 0816  BP: (!) 165/94 (!) 177/95  (!) 190/88  Pulse: 95 (!) 107  86  Resp: Temp: 99 F (37.2 C) 98.4 F (36.9 C)  98.5 F (36.9 C)  TempSrc: Oral Oral  Oral  SpO2: 92% 98%  100%  Weight:   104.6 kg   Height:        Intake/Output Summary (Last 24 hours) at 03/25/2020 1020 Last data filed at 03/25/2020 0550 Gross per 24 hour  Intake 525.21 ml  Output 1000 ml  Net -474.79 ml   Filed Weights   03/19/20 0400 03/23/20 0349 03/25/20 0500  Weight: 80.2 kg 79.3 kg 104.6 kg    Examination: General exam: Appears comfortable  HEENT: PERRLA, oral mucosa moist, no sclera icterus or thrush Respiratory system: Clear to auscultation. Respiratory effort normal. Cor trac present Cardiovascular system: S1 & S2 heard, regular rate and rhythm Gastrointestinal system: Abdomen soft, non-tender, nondistended. Normal bowel sounds   Central nervous system: Alert - follows some command Extremities: No cyanosis, clubbing or edema Skin: No rashes or ulcers Psychiatry:  cannot assess  Data Reviewed: I have personally reviewed following labs and imaging studies  CBC: Recent Labs  Lab 03/18/20 1420 03/18/20 1719 03/18/20 1932 03/19/20 1128 03/20/20 0504 03/21/20 0534 03/25/20 0648  WBC 3.8*  --   --  7.2 6.2 5.6 6.5  NEUTROABS 1.9  --   --   --   --   --   --   HGB 11.8*   < > 12.2 12.8 13.4 13.0 11.2*  HCT 37.7   < > 36.0 39.8 41.5 38.8 34.8*  MCV 101.9*  --   --  98.8 100.0 99.0 98.9  PLT 182  --   --  213 186 187 208   < > = values in this interval not displayed.   Basic Metabolic Panel: Recent Labs  Lab 03/18/20 1936 03/19/20 1128 03/20/20 1640 03/21/20 0534 03/21/20 1701 03/22/20 0406 03/23/20 0333 03/24/20 0310 03/25/20 0648  NA  --    < >   --  141  --  143 142 137 136  K  --    < >  --  3.9  --  4.4 4.9 5.0 4.8  CL  --    < >  --  105  --  107 106 101 102  CO2  --    < >  --  25  --  GLUCOSE  --    < >  --  191*  --  193* 194* 186* 297*  BUN  --    < >  --  27*  --  32* 39* 40* 43*  CREATININE  --    < >  --  1.75*  --  1.85* 2.04* 2.05* 2.15*  CALCIUM  --    < >  --  8.9  --  8.9 9.1 9.0 9.7  MG 1.7  --  2.1 2.0 2.1 2.2  --   --   --   PHOS 3.6  --  3.9 3.9 3.9 3.9  --   --   --    < > = values in this interval not displayed.   GFR: Estimated Creatinine Clearance: 22.1 mL/min (A) (by C-G formula based on SCr of 2.15 mg/dL (H)). Liver Function Tests: Recent Labs  Lab 03/18/20 1420  AST 20  ALT 11  ALKPHOS 56  BILITOT 0.6  PROT 5.5*  ALBUMIN 2.9*   No results for input(s): LIPASE, AMYLASE in the last 168 hours. Recent Labs  Lab 03/18/20 1615  AMMONIA 24   Coagulation Profile: Recent Labs  Lab 03/18/20 1420  INR 1.0   Cardiac Enzymes: No results for input(s): CKTOTAL, CKMB, CKMBINDEX, TROPONINI in the last 168 hours. BNP (last 3 results) No results for input(s): PROBNP in the last 8760 hours. HbA1C: No results for input(s): HGBA1C in the last 72 hours. CBG: Recent Labs  Lab 03/24/20 1643 03/24/20 2131 03/24/20 2322 03/25/20 0337 03/25/20 0810  GLUCAP 146* 190* 159* 218* 259*   Lipid Profile: No results for input(s): CHOL, HDL, LDLCALC, TRIG, CHOLHDL, LDLDIRECT in the last 72 hours. Thyroid Function Tests: No results for input(s): TSH, T4TOTAL, FREET4, T3FREE, THYROIDAB in the last 72 hours. Anemia Panel: No results for input(s): VITAMINB12, FOLATE, FERRITIN, TIBC, IRON, RETICCTPCT in the last 72 hours. Urine analysis:    Component Value Date/Time   COLORURINE YELLOW 03/18/2020 1647   APPEARANCEUR CLEAR 03/18/2020 1647   LABSPEC 1.010 03/18/2020 1647   PHURINE 5.5 03/18/2020 1647   GLUCOSEU NEGATIVE 03/18/2020 1647   HGBUR TRACE (A) 03/18/2020 1647   BILIRUBINUR NEGATIVE  03/18/2020 1647   KETONESUR NEGATIVE 03/18/2020 1647   PROTEINUR NEGATIVE 03/18/2020 1647   UROBILINOGEN 0.2 11/22/2013 0945   NITRITE NEGATIVE 03/18/2020 1647   LEUKOCYTESUR MODERATE (A) 03/18/2020 1647   Sepsis Labs: @LABRCNTIP (procalcitonin:4,lacticidven:4) ) Recent Results (from the past 240 hour(s))  Resp Panel by RT-PCR (Flu A&B, Covid) Nasopharyngeal Swab     Status: None   Collection Time: 03/18/20  3:10 PM   Specimen: Nasopharyngeal Swab; Nasopharyngeal(NP) swabs in vial transport medium  Result Value Ref Range Status   SARS Coronavirus 2 by RT PCR NEGATIVE NEGATIVE Final    Comment: (NOTE) SARS-CoV-2 target nucleic acids are NOT DETECTED.  The SARS-CoV-2 RNA is generally detectable in upper respiratory specimens during the acute phase of infection. The lowest concentration of SARS-CoV-2 viral copies this assay can detect is 138 copies/mL. A negative result does not preclude SARS-Cov-2 infection and should not be used as the sole basis for treatment or other patient management decisions. A negative result may occur with  improper specimen collection/handling, submission of specimen other than nasopharyngeal swab, presence of viral mutation(s) within the areas targeted by this assay, and inadequate number of viral copies(<138 copies/mL). A negative result must be combined with clinical observations, patient history, and epidemiological information. The expected result is Negative.  Fact Sheet for Patients:  05/16/20  Fact Sheet for Healthcare Providers:  BloggerCourse.com  This test is no t yet approved or cleared by the SeriousBroker.it and  has been authorized for  detection and/or diagnosis of SARS-CoV-2 by FDA under an Emergency Use Authorization (EUA). This EUA will remain  in effect (meaning this test can be used) for the duration of the COVID-19 declaration under Section 564(b)(1) of the Act,  21 U.S.C.section 360bbb-3(b)(1), unless the authorization is terminated  or revoked sooner.       Influenza A by PCR NEGATIVE NEGATIVE Final   Influenza B by PCR NEGATIVE NEGATIVE Final    Comment: (NOTE) The Xpert Xpress SARS-CoV-2/FLU/RSV plus assay is intended as an aid in the diagnosis of influenza from Nasopharyngeal swab specimens and should not be used as a sole basis for treatment. Nasal washings and aspirates are unacceptable for Xpert Xpress SARS-CoV-2/FLU/RSV testing.  Fact Sheet for Patients: BloggerCourse.comhttps://www.fda.gov/media/152166/download  Fact Sheet for Healthcare Providers: SeriousBroker.ithttps://www.fda.gov/media/152162/download  This test is not yet approved or cleared by the [place] FDA and has been authorized for detection and/or diagnosis of SARS-CoV-2 by FDA under an Emergency Use Authorization (EUA). This EUA will remain in effect (meaning this test can be used) for the duration of the COVID-19 declaration under Section 564(b)(1) of the Act, 21 U.S.C. section 360bbb-3(b)(1), unless the authorization is terminated or revoked.  Performed at [facility], [address] [place], [place] [address]   Blood culture (routine x 2)     Status: None   Collection Time: 03/18/20  3:11 PM   Specimen: BLOOD  Result Value Ref Range Status   Specimen Description BLOOD SITE NOT SPECIFIED  Final   Special Requests   Final    BOTTLES DRAWN AEROBIC AND ANAEROBIC Blood Culture results may not be optimal due to an inadequate volume of blood received in culture bottles   Culture   Final    NO GROWTH 5 DAYS Performed at [facility], [address] [place], [place] [address]    Report Status 03/23/2020 FINAL  Final  Blood culture (routine x 2)     Status: None   Collection Time: 03/18/20  3:11 PM   Specimen: BLOOD  Result Value Ref Range Status   Specimen Description BLOOD SITE NOT SPECIFIED  Final   Special Requests   Final    BOTTLES DRAWN AEROBIC AND ANAEROBIC Blood  Culture results may not be optimal due to an inadequate volume of blood received in culture bottles   Culture   Final    NO GROWTH 5 DAYS Performed at [facility], [address] [place], [place] [address]    Report Status 03/23/2020 FINAL  Final  Culture, Urine     Status: Abnormal   Collection Time: 03/18/20  5:59 PM   Specimen: Urine, Catheterized  Result Value Ref Range Status   Specimen Description URINE, CATHETERIZED  Final   Special Requests Normal  Final   Culture (A)  Final    50,000 COLONIES/mL GROUP B STREP(S.AGALACTIAE)ISOLATED TESTING AGAINST S. AGALACTIAE NOT ROUTINELY PERFORMED DUE TO PREDICTABILITY OF AMP/PEN/VAN SUSCEPTIBILITY. Performed at [facility], [address] [place], [place] [address]    Report Status 03/19/2020 FINAL  Final  MRSA PCR Screening     Status: Abnormal   Collection Time: 03/18/20  6:35 PM   Specimen: Nasal Mucosa; Nasopharyngeal  Result Value Ref Range Status   MRSA by PCR POSITIVE (A) NEGATIVE Final    Comment:        The GeneXpert MRSA Assay (FDA approved for NASAL specimens only), is one component of a comprehensive MRSA colonization surveillance program. It is not intended  to diagnose MRSA infection nor to guide or monitor treatment for MRSA infections. RESULT CALLED TO, READ BACK BY AND VERIFIED WITH: [facility] RN AT 2041 03/18/2020 MITCHELL,L Performed at [facility], [address], [place], [place] [address]          Radiology Studies: No results found.    Scheduled Meds: . amantadine  100 mg Per Tube BID  . chlorhexidine  15 mL Mouth Rinse BID  . Chlorhexidine Gluconate Cloth  6 each Topical Daily  . clopidogrel  75 mg Per Tube Q breakfast  . feeding supplement (PROSource TF)  45 mL Per Tube TID  . free water  200 mL Per Tube Q4H  . heparin  5,000 Units Subcutaneous Q8H  . insulin aspart  0-9 Units Subcutaneous Q4H  . levothyroxine  112 mcg Per Tube QAC breakfast  . mouth rinse  15 mL Mouth  Rinse q12n4p  . methylphenidate  5 mg Per Tube BID WC  . rosuvastatin  10 mg Per Tube Daily  . sodium chloride flush  3 mL Intravenous Once   Continuous Infusions: . sodium chloride 10 mL/hr at 03/24/20 0305  . feeding supplement (OSMOLITE 1.2 CAL) 45 mL/hr at 03/23/20 1900     LOS: 7 days      Calvert Cantor, MD [facility] Pager: [email or web address] 03/25/2020, 10:20 AM

## 2020-03-25 NOTE — Progress Notes (Addendum)
STROKE TEAM PROGRESS NOTE   INTERVAL HISTORY No acute events overnight.  Patient alert and  in mitten restraint pulling at [facility]. Husband at bedside gently redirecting her away from feeding tube.  She is able to state her name today. She speaks in incomprehensible sentences spontaneously. Does not follow commands reliably. Much more attentive to husband than examiner.   Vitals:   03/25/20 0500 03/25/20 0816 03/25/20 1237 03/25/20 1237  BP:  (!) 190/88 (!) 182/94 (!) 182/94  Pulse:  86 97 97  Resp:  20 20   Temp:  98.5 F (36.9 C) 99 F (37.2 C) 99 F (37.2 C)  TempSrc:  Oral Oral Oral  SpO2:  100% 100% 100%  Weight: 104.6 kg     Height:       CBC:  Recent Labs  Lab 03/21/20 0534 03/25/20 0648  WBC 5.6 6.5  HGB 13.0 11.2*  HCT 38.8 34.8*  MCV 99.0 98.9  PLT 187 208   Basic Metabolic Panel:  Recent Labs  Lab 03/21/20 1701 03/22/20 0406 03/23/20 0333 03/24/20 0310 03/25/20 0648  NA  --  143   < > 137 136  K  --  4.4   < > 5.0 4.8  CL  --  107   < > 101 102  CO2  --  27   < > 24 24  GLUCOSE  --  193*   < > 186* 297*  BUN  --  32*   < > 40* 43*  CREATININE  --  1.85*   < > 2.05* 2.15*  CALCIUM  --  8.9   < > 9.0 9.7  MG 2.1 2.2  --   --   --   PHOS 3.9 3.9  --   --   --    < > = values in this interval not displayed.   Lipid Panel:  Recent Labs  Lab 03/19/20 0424  CHOL 245*  TRIG 162*  HDL 58  CHOLHDL 4.2  VLDL 32  LDLCALC 409*   HgbA1c:  Recent Labs  Lab 03/18/20 1936  HGBA1C 7.6*   Urine Drug Screen:  Recent Labs  Lab 03/18/20 1557  LABOPIA NONE DETECTED  COCAINSCRNUR NONE DETECTED  LABBENZ NONE DETECTED  AMPHETMU NONE DETECTED  THCU NONE DETECTED  LABBARB NONE DETECTED    Alcohol Level No results for input(s): ETH in the last 168 hours.  IMAGING past 24 hours US RENAL  Result Date: 03/25/2020 CLINICAL DATA:  87 year old female with renal failure. EXAM: RENAL / URINARY TRACT ULTRASOUND COMPLETE COMPARISON:  04/02/2019 CT FINDINGS:  Right Kidney: Renal measurements: 9.9 x 5.7 x 5.2 cm = volume: 150 mL. Moderate to severe RIGHT hydronephrosis is identified. A 1.2 cm calculus in the region of the renal pelvis/possible UPJ noted. Renal cortical thinning is noted. Left Kidney: Not visualized Bladder: Ureteral jets are not noted.  Otherwise appears normal. Other: None. IMPRESSION: Moderate to severe RIGHT hydronephrosis with a 1.2 cm calculus in the region of the renal pelvis/possible UPJ. Consider further evaluation with CT to evaluate site of obstruction. LEFT kidney not visualized. These results will be called to the ordering clinician or representative by the Radiologist Assistant, and communication documented in the PACS or [employer]. Electronically Signed   By: Harmon Pier M.D.   On: 03/25/2020 11:45   PHYSICAL EXAM Physical Exam:     Frail elderly female lying in bed alert and mildly agitated and attempting to pull out feeding tube.  Head is  nontraumatic.  Resp: Without extra work of breathing  Neurological Exam  Severely dysarthric responses to questions. Able to state name and husband's name more clearly.  Face appears symmetric.     Tongue midline Hearing intact to voice She moves right upper and both lower extremities purposefully but does not follow commands for formal testing.  LUE at least antigravity.   Gait not tested   ASSESSMENT/PLAN [name] is a 87 y.o. female with cerebral amyloid angiopathy, brain aneurysm, embolic stroke 2021, DM2, PVD, PE, HTN obesity who presented after sudden onset of unresponsiveness and altered mental status with possibly some increased left arm weakness of unclear etiology.    Bilateral medial thalamic and punctate left superior cerebellar infarcts possibly from embolism to top of the basilar though involvement of artery of Percheron which may supply both medial thalamus due to congenital variant and technically is a small vessel disease could explain this  presentation.  Tiny left cerebellar punctate infarct may also be a concurrent lacune.  However she has had previous history of embolic as well as small vessel infarcts in October 21 hence strong suspicion for underlying paroxysmal A. Fib.   Studies: CT head no acute abnormalities MRI bilateral medial thalamic punctate left superior cerebellar infarct CTA suboptimal due to contrast timing but no LVO.  Diminutive flow in the right vertebral artery ECHO on 11/24/2019 ejection fraction 60-65%.  No wall motion abnormalities. LDL 155 mg percent HbA1c 7.6 EEG showed no seizures   LDL 155  HgbA1c 7.6  VTE prophylaxis is recommended    Diet   Diet NPO time specified   Dysphagia with cortrak tube placed for feeding and medications  On plavix prior to admission.    Hypertension  Hypertensive to 190 systolic this morning and consistently for past 2 days  . Permissive hypertension (OK if < 220/120) but gradually normalize in 5-7 days . Long-term BP goal normotensive  Hyperlipidemia  Home meds:  Crestor 5mg    LDL 155, goal < 70  Crestor increased to 10mg  upon admission, will hold in setting of AKI per discussion with pharmacist. Resume when appropriate.   Continue statin at discharge  Diabetes type II Uncontrolled  HgbA1c 7.6, goal < 7.0  CBGs Recent Labs    03/25/20 0337 03/25/20 0810 03/25/20 1218  GLUCAP 218* 259* 234*      SSI   Somnolence/Encephalopathy Multifactorial. Could be related to thalamic infarcts. In light of worsening renal function and heart failure precaution will reduce Amantadine to 50mg  daily. DC Ritalin due to agitation, severe hypertension.  Discussed plan with 3W pharmacist.  Other Stroke Risk Factors  Advanced Age >/= 62   Hx stroke/TIA  Grade 2 diastolic dysfunction   Other Active Problems  AKI: Baseline Ctn 1.0-1.3  Renal ultrasound reveals moderate to severe Hydronephrosis on the right with possible UP junction stone- Urology  consulted   Acute metabolic encephalopathy - waking up from her brain injury and agitated- trying to arrange sitter for agitation she is in soft wrist restrains for now  Grade 2 diastolic heart failure-chronic -2D echo during this admission reveals grade 2 diastolic dysfunction Hospital day # 7   Neurology stroke with sign off. Please do not hesitate to reach out with questions.  Delila A Bailey-Modzik, NP-C  To contact Stroke Continuity provider, please refer to 03/27/20. After hours, contact General Neurology   Hospital day # 7   03/25/2020 3:13 PM    ATTENDING NOTE: I reviewed above note and agree with  the assessment and plan. Pt was seen and examined.   87 year old female with history of diabetes, hypertension, hyperlipidemia, PE admitted for altered mental status and left arm weakness.  Patient had a stroke 11/2019 with left sided weakness.  MRI showed old left PCA infarct, acute right frontal and occipital infarct, as well as cerebral microbleeds consistent with CAA pattern.  MRA head and neck negative.  EF 60 to 65%.  LDL 128 and A1c 7.5.  Given CAA, patient not a anticoagulant candidate, no embolic work-up performed.  Patient discharged with Plavix and pravastatin 40.  Recommend no escalation of antithrombotic regimen given CAA.  On this admission, CTA head neck showed left M2 severe stenosis, bilateral siphon and bulb atherosclerosis with moderate stenosis.  MRI showed bilateral thalamus, medial midbrain infarcts as well as 1 punctate left SCA infarct.  Infarct pattern consistent with stroke in artery of percheron which more likely small vessel disease.  However patient does have a punctate left SCA infarct, concerning for embolic source.  However, patient had CAA, not a anticoagulant candidate.  MRV also concerning for bilateral transverse/sigmoid sinus junction CSVT, however, again patient not candidate for anticoagulation.  EF 60 to 65% in 11/2019.  LDL 155, A1c  7.6.  On rounding today, husband at bedside.  He stated that patient was eyes open this morning which confirmed by RN, but still not follow commands.  However, on my exam, patient eyes closed, not open with voice or pain stimulation, with forced eye opening, patient activity resisted opening.  Eyes mid position, spontaneous rolling motion, pupil equal size, however not able to check visual field or corneal reflexes.  No significant facial droop, no significant movement of bilateral upper extremity with pain stimulation, however, husband and RN reported moving equally bilateral spontaneously this morning while awake.  Bilateral lower extremity proximal 0/5, however moving toes and ankles spontaneously and symmetrically. Sensation, coordination and gait not tested.  Patient had urinary retention, status post Foley catheter.  However CT abdomen pelvis continue to show a large bladder despite Foley catheter with severe right and mild left hydronephrosis.  Creatinine also trending up, likely due to post renal obstruction.  She was amantadine and Ritalin for arousal.  Given elevated creatinine, discussed with pharmacy, will continue amantadine at 50 twice daily but DC Ritalin.  Also holding Crestor.  She was on aspirin, now changed to home Plavix.  Continue on discharge, no escalation of antithrombotic regimen given CAA.   Neurology will sign off. Please call with questions. Pt will follow up with stroke clinic Dr. Pearlean Brownie at [facility] in about 4 weeks after discharge. Thanks for the consult.   Marvel Plan, MD PhD Stroke Neurology 03/25/2020 5:53 PM

## 2020-03-26 DIAGNOSIS — G9340 Encephalopathy, unspecified: Secondary | ICD-10-CM | POA: Diagnosis not present

## 2020-03-26 DIAGNOSIS — Z515 Encounter for palliative care: Secondary | ICD-10-CM | POA: Diagnosis not present

## 2020-03-26 DIAGNOSIS — Z7189 Other specified counseling: Secondary | ICD-10-CM | POA: Diagnosis not present

## 2020-03-26 DIAGNOSIS — Z66 Do not resuscitate: Secondary | ICD-10-CM | POA: Diagnosis not present

## 2020-03-26 LAB — RENAL FUNCTION PANEL
Albumin: 2.5 g/dL — ABNORMAL LOW (ref 3.5–5.0)
Anion gap: 11 (ref 5–15)
BUN: 55 mg/dL — ABNORMAL HIGH (ref 8–23)
CO2: 27 mmol/L (ref 22–32)
Calcium: 9.2 mg/dL (ref 8.9–10.3)
Chloride: 97 mmol/L — ABNORMAL LOW (ref 98–111)
Creatinine, Ser: 2.67 mg/dL — ABNORMAL HIGH (ref 0.44–1.00)
GFR, Estimated: 17 mL/min — ABNORMAL LOW (ref >60)
Glucose, Bld: 187 mg/dL — ABNORMAL HIGH (ref 70–99)
Phosphorus: 5.7 mg/dL — ABNORMAL HIGH (ref 2.5–4.6)
Potassium: 5.2 mmol/L — ABNORMAL HIGH (ref 3.5–5.1)
Sodium: 135 mmol/L (ref 135–145)

## 2020-03-26 LAB — GLUCOSE, CAPILLARY
Glucose-Capillary: 195 mg/dL — ABNORMAL HIGH (ref 70–99)
Glucose-Capillary: 187 mg/dL — ABNORMAL HIGH (ref 70–99)
Glucose-Capillary: 202 mg/dL — ABNORMAL HIGH (ref 70–99)
Glucose-Capillary: 156 mg/dL — ABNORMAL HIGH (ref 70–99)

## 2020-03-26 MED ORDER — SODIUM CHLORIDE 0.9 % IV SOLN
1.0000 g | INTRAVENOUS | Status: AC
  Administered 2020-03-26 – 2020-03-30 (×5): 1 g via INTRAVENOUS
  Filled 2020-03-26 (×5): qty 10

## 2020-03-26 MED ORDER — SODIUM ZIRCONIUM CYCLOSILICATE 10 G PO PACK
10.0000 g | PACK | Freq: Once | ORAL | Status: DC
  Filled 2020-03-26: qty 1

## 2020-03-26 MED ORDER — SODIUM ZIRCONIUM CYCLOSILICATE 10 G PO PACK
10.0000 g | PACK | Freq: Once | ORAL | Status: AC
  Administered 2020-03-26: 10 g

## 2020-03-26 MED ORDER — AMLODIPINE BESYLATE 5 MG PO TABS
5.0000 mg | ORAL_TABLET | Freq: Every day | ORAL | Status: DC
  Administered 2020-03-27 – 2020-05-01 (×36): 5 mg
  Filled 2020-03-26 (×37): qty 1

## 2020-03-26 NOTE — Progress Notes (Signed)
  Speech Language Pathology Treatment: Dysphagia  Patient Details Name: NANCEY KREITZ MRN: [id number] DOB: August 12, 1932 Today's Date: 03/26/2020 Time: 3875-6433 SLP Time Calculation (min) (ACUTE ONLY): 10 min  Assessment / Plan / Recommendation Clinical Impression  Pt keeps her eyes closed but is more verbally interactive with SLP today. She responses primarily at the word level, and her speech remains dysarthric, but she is more consistently responding to questions. She is also follow commands to open her mouth wider to accept boluses, although she consistently needs these cues with each bolus presented. Honey thick liquids via spoon elicit strong, immediate coughing that is also fairly prolonged, but no coughing is observed with bites of puree after what appears to be prolonged bolus formation and/or transit. Given her mentation I still would not start a PO diet, but at least could consider meds crushed in puree if she were to lose access via cortrak. SLP will continue to follow with potential to initiate a diet pending further improvements in mentation and more persistent levels of alertness.   HPI HPI: Ms. LAKEA MITTELMAN is a 87 y.o. female with bilateral medial thalamic and punctate left superior cerebellar infarcts. PMHx significant for multiple CVAs with most recent acute/subacute R frontal lobe and L parietal and occipital lobe infarcts ~3 months ago, residual L-sided hemiplegia, hypothyroidism, GERD, HTN, TIA, DM, PE, anxiety, HTN, and HLD.      SLP Plan  Continue with current plan of care       Recommendations  Diet recommendations: NPO Medication Administration: Via alternative means (could consider crushed in puree if pt were to lose access via cortrak)                Oral Care Recommendations: Oral care QID Follow up Recommendations: Skilled Nursing facility SLP Visit Diagnosis: Dysphagia, oropharyngeal phase (R13.12) Plan: Continue with current plan of care        GO                Mahala Menghini., M.A. CCC-SLP Acute Rehabilitation Services Pager [phone] Office [phone]  03/26/2020, 2:03 PM

## 2020-03-26 NOTE — Progress Notes (Addendum)
Nephrology Follow-Up Consult note   Assessment/Recommendations: Hannah Fischer is a/an 87 y.o. female with a past medical history significant for HTN, DM 2, CVA, hypothyroidism who present w/ stroke now complicated by AKI  AKI on CKD 3a: BL 1-1.2. Slowly progressive Crt rise now 2.1. Good UOP. Did receive contrast on 2/12 but unlikely having an effect this far out.    Continues to have worsening creatinine with CT scan concerning for severe hydronephrosis.  This is the cause of her AKI.  Also concern for possible infection based on urinalysis.  Will ask IR to help with nephrostomy tube -Consult IR for nephrostomy tube placement -Appears euvolemic for now; could give fluids as needed -Appreciate urology assistance -Continue monitor creatinine daily -Monitor ins and outs -Distended bladder on CT scan despite Foley catheter being in place, suggest flushing or replacing  Urinary tract infection.  Associated with obstruction as above.  Start ceftriaxone and follow-up urine culture.  Intervention for obstruction as above  Hypertension: Continue amlodipine 5 mg daily  DM2: Management per primary team  Hypothyroidism: On supplementation  Hyperkalemia: Likely associated with obstruction and AKI.  Lokelma x1 today.  Encephalopathy/CVA: Minimal interaction.  Management per primary team  ADDENDUM: Discussed with IR and Urology. Unclear what is causing the obstruction (no reason to have papillary necrosis that I can think of). Would like to repeat RUS in the morning to re-assess hydronephrosis. Also gives time to reassess her Crt. Will consider unilateral vs bilateral neph tube based on RUS results. Primary team to update family. NPO at Midnight. Appreciate multidisciplinary help.   Recommendations conveyed to primary service.    Darnell Level [facility] 03/26/2020 10:57 AM  ___________________________________________________________  CC: AKI on CKD  Interval  History/Subjective: Patient resting today without complaints.  Difficult to communicate.  Urine output slightly less today.  Creatinine rising.  CT scan with severe hydronephrosis.  Urinalysis with concerns for infection.   Medications:  Current Facility-Administered Medications  Medication Dose Route Frequency Provider Last Rate Last Admin  . 0.9 %  sodium chloride infusion  250 mL Intravenous Continuous Stretch, Marveen Reeks, MD 10 mL/hr at 03/24/20 0305 Restarted at 03/24/20 0305  . [START ON 03/27/2020] amantadine (SYMMETREL) 50 MG/5ML solution 50 mg  50 mg Per Tube Daily Bailey-Modzik, Delila A, NP      . [START ON 03/27/2020] amLODipine (NORVASC) tablet 5 mg  5 mg Per Tube Daily Etter Sjogren, MD      . cefTRIAXone (ROCEPHIN) 1 g in sodium chloride 0.9 % 100 mL IVPB  1 g Intravenous Q24H Darnell Level, MD 200 mL/hr at 03/26/20 0946 1 g at 03/26/20 0946  . chlorhexidine (PERIDEX) 0.12 % solution 15 mL  15 mL Mouth Rinse BID Karie Fetch P, DO   15 mL at 03/26/20 0835  . Chlorhexidine Gluconate Cloth 2 % PADS 6 each  6 each Topical Daily Steffanie Dunn, DO   6 each at 03/26/20 [phone]  . clopidogrel (PLAVIX) tablet 75 mg  75 mg Per Tube Q breakfast Leander Rams, RPH   75 mg at 03/26/20 2683  . docusate (COLACE) 50 MG/5ML liquid 100 mg  100 mg Per Tube BID PRN Kathlen Mody, MD      . feeding supplement (OSMOLITE 1.2 CAL) liquid 1,000 mL  1,000 mL Per Tube Continuous Olalere, Adewale A, MD 45 mL/hr at 03/26/20 0011 Infusion Verify at 03/26/20 0011  . feeding supplement (PROSource TF) liquid 45 mL  45 mL Per Tube TID  Virl Diamond A, MD   45 mL at 03/26/20 0940  . free water 200 mL  200 mL Per Tube Q4H Rizwan, Saima, MD   200 mL at 03/26/20 0836  . heparin injection 5,000 Units  5,000 Units Subcutaneous Q8H Selmer Dominion B, NP   5,000 Units at 03/26/20 7517  . insulin aspart (novoLOG) injection 0-9 Units  0-9 Units Subcutaneous Q4H Selmer Dominion B, NP   2 Units at 03/26/20 [phone]  . levothyroxine  (SYNTHROID) tablet 112 mcg  112 mcg Per Tube QAC breakfast Kathlen Mody, MD   112 mcg at 03/26/20 4944  . MEDLINE mouth rinse  15 mL Mouth Rinse q12n4p Karie Fetch P, DO   15 mL at 03/25/20 1700  . polyethylene glycol (MIRALAX / GLYCOLAX) packet 17 g  17 g Per Tube Daily PRN Kathlen Mody, MD      . sodium chloride flush (NS) 0.9 % injection 3 mL  3 mL Intravenous Once Milagros Loll, MD      . sodium zirconium cyclosilicate (LOKELMA) packet 10 g  10 g Oral Once Darnell Level, MD          Review of Systems: 10 systems reviewed and negative except per interval history/subjective  Physical Exam: Vitals:   03/26/20 0416 03/26/20 0817  BP: (!) 147/61 136/65  Pulse: 82 80  Resp: 18 18  Temp: 99.2 F (37.3 C) 98.6 F (37 C)  SpO2: 98% 100%   No intake/output data recorded.  Intake/Output Summary (Last 24 hours) at 03/26/2020 1057 Last data filed at 03/26/2020 0011 Gross per 24 hour  Intake 3984.76 ml  Output 1150 ml  Net 2834.76 ml   General: ill-appearing, lying in bed, resting HEENT: anicteric sclera, oropharynx clear without lesions CV: Normal rate, systolic murmur present Lungs: Bilateral chest rise, no increased work of breathing Abd: soft, non-tender, non-distended Skin: no visible lesions or rashes   Test Results I personally reviewed new and old clinical labs and radiology tests Lab Results  Component Value Date   NA 135 03/26/2020   K 5.2 (H) 03/26/2020   CL 97 (L) 03/26/2020   CO2 27 03/26/2020   BUN 55 (H) 03/26/2020   CREATININE 2.67 (H) 03/26/2020   GFR 70.52 10/15/2013   CALCIUM 9.2 03/26/2020   ALBUMIN 2.5 (L) 03/26/2020   PHOS 5.7 (H) 03/26/2020

## 2020-03-26 NOTE — Progress Notes (Addendum)
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  At that she had gone to bed last night and in the morning he had trouble waking her up. In the ED she was noted to be responsive to pain.  A code stroke was called.  She was outside the window for TPA.  An emergent CT did not show any acute abnormality.  A CTA was performed and did not reveal any obvious stenosis. An MRI of the brain was performed on 2/13:Punctate acute infarction in the left superior cerebellumadjacent to the superior cerebellar peduncle. Bilateral thalamic acute infarction extending back into the mid brain. This is consistent with artery of [place] infarction.  Subjective: She is no different from yesterday.   Assessment & Plan:   Principal Problem:   Acute encephalopathy secondary to bilateral thalamic infarcts -Prior history of embolic infarcts -The patient currently has an NG tube  -Neurology and the stroke team have seen her -Started on amantadine and Ritalin to help improve her lethargy- she is now more easy to awaken and does follow commands and try to communicate but continues to sleep most of the day and prefers to keep eyes closed -Due to her lethargy, she has a cor track and is receiving Osmolite -Palliative care has been consulted and the family would like to continue aggressive care but does not want CPR or shocks and thus she is a DNR -I appreciate palliative care following and speaking with family about the goals of care  Active Problems: Acute metabolic encephalopathy - waking up from her brain injury and agitated- will ask for a sitter - she is in soft wrist restrains for now  CKD 3A- AKI due to  dehydration -Baseline creatinine appears to be 1.0-1.3 -started free water per tube and watching I & O closely to prevent heart failure exacerbation - BUN Cr continue to rise despite adequate hydration - 2/19>- obtained renal ultrasound which is showed right sided hydronephrosis and nephrolithiasis with a UPJ stone - have consulted nephrology and urology- plans are for a repeat renal ultrasound and possible nephrostomy   Grade 2 diastolic heart failure-chronic -2D echo during this admission reveals grade 2 diastolic dysfunction - following fluid status closely     Diabetes mellitus 2 -Continue on insulin sliding scale every 4 hours while she is on tube feeds Component Value Date/Time   HGBA1C 7.6 (H) 03/18/2020 1936      Hypothyroidism -Continue Synthroid per tube    Time spent in minutes: 35 DVT prophylaxis: Heparin Code Status: DO NOT RESUSCITATE Family Communication: Husband- I have tried to reach him to update him today- Have left a message.  Level of Care: Level of care: Progressive Disposition Plan:  Status is: Inpatient  Remains inpatient appropriate because:IV treatments appropriate due to intensity of illness or inability to take PO   Dispo: The patient is from: Home              Anticipated d/c is to: To be determined               Anticipated d/c date is: > 3 days              Patient currently is not medically stable  to d/c.   Difficult to place patient No   Consultants:   Neurology  Urology  Nephrology Procedures:   Cor trac Antimicrobials:  Anti-infectives (From admission, onward)   Start     Dose/Rate Route Frequency Ordered Stop   03/26/20 0730  cefTRIAXone (ROCEPHIN) 1 g in sodium chloride 0.9 % 100 mL IVPB        1 g 200 mL/hr over 30 Minutes Intravenous Every 24 hours 03/26/20 0644 03/31/20 0729   03/18/20 1900  azithromycin (ZITHROMAX) 500 mg in sodium chloride 0.9 % 250 mL IVPB  Status:  Discontinued        500 mg 250 mL/hr over 60  Minutes Intravenous Every 24 hours 03/18/20 1830 03/19/20 1106   03/18/20 1900  cefTRIAXone (ROCEPHIN) 2 g in sodium chloride 0.9 % 100 mL IVPB  Status:  Discontinued        2 g 200 mL/hr over 30 Minutes Intravenous Every 24 hours 03/18/20 1830 03/19/20 1106       Objective: Vitals:   03/25/20 2036 03/25/20 2342 03/26/20 0416 03/26/20 0817  BP: 132/60 (!) 138/55 (!) 147/61 136/65  Pulse: 73 72 82 80  Resp: Temp: 98.1 F (36.7 C) 99.3 F (37.4 C) 99.2 F (37.3 C) 98.6 F (37 C)  TempSrc: Oral Oral Oral Oral  SpO2: 99% 98% 98% 100%  Weight:      Height:        Intake/Output Summary (Last 24 hours) at 03/26/2020 1229 Last data filed at 03/26/2020 0011 Gross per 24 hour  Intake 3984.76 ml  Output 1150 ml  Net 2834.76 ml   Filed Weights   03/19/20 0400 03/23/20 0349 03/25/20 0500  Weight: 80.2 kg 79.3 kg 104.6 kg    Examination: General exam: Appears comfortable  HEENT: PERRL, oral mucosa dry, no sclera icterus or thrush- cor trac present Respiratory system: Clear to auscultation. Respiratory effort normal. Cardiovascular system: S1 & S2 heard, regular rate and rhythm Gastrointestinal system: Abdomen soft, non-tender, nondistended. Normal bowel sounds   Central nervous system: quite sleepy- awakens and smiles but does not keep eyes open. Not following commands today Extremities: No cyanosis, clubbing or edema Skin: No rashes or ulcers Psychiatry:  difficult to assess  Data Reviewed: I have personally reviewed following labs and imaging studies  CBC: Recent Labs  Lab 03/20/20 0504 03/21/20 0534 03/25/20 0648  WBC 6.2 5.6 6.5  HGB 13.4 13.0 11.2*  HCT 41.5 38.8 34.8*  MCV 100.0 99.0 98.9  PLT 186 187 208   Basic Metabolic Panel: Recent Labs  Lab 03/20/20 1640 03/21/20 0534 03/21/20 1701 03/22/20 0406 03/23/20 0333 03/24/20 0310 03/25/20 0648 03/26/20 0726  NA  --  141  --  143 142 137 136 135  K  --  3.9  --  4.4 4.9 5.0 4.8 5.2*  CL  --   105  --  107 106 101 102 97*  CO2  --  25  --  GLUCOSE  --  191*  --  193* 194* 186* 297* 187*  BUN  --  27*  --  32* 39* 40* 43* 55*  CREATININE  --  1.75*  --  1.85* 2.04* 2.05* 2.15* 2.67*  CALCIUM  --  8.9  --  8.9 9.1 9.0 9.7 9.2  MG 2.1 2.0 2.1 2.2  --   --   --   --   PHOS 3.9 3.9 3.9 3.9  --   --   --  5.7*   GFR: Estimated Creatinine Clearance: 17.8 mL/min (A) (by C-G formula based on SCr of 2.67 mg/dL (H)). Liver Function Tests: Recent Labs  Lab 03/26/20 0726  ALBUMIN 2.5*   No results for input(s): LIPASE, AMYLASE in the last 168 hours. No results for input(s): AMMONIA in the last 168 hours. Coagulation Profile: No results for input(s): INR, PROTIME in the last 168 hours. Cardiac Enzymes: No results for input(s): CKTOTAL, CKMB, CKMBINDEX, TROPONINI in the last 168 hours. BNP (last 3 results) No results for input(s): PROBNP in the last 8760 hours. HbA1C: No results for input(s): HGBA1C in the last 72 hours. CBG: Recent Labs  Lab 03/25/20 1609 03/25/20 2034 03/25/20 2341 03/26/20 0414 03/26/20 0842  GLUCAP 245* 219* 153* 195* 187*   Lipid Profile: No results for input(s): CHOL, HDL, LDLCALC, TRIG, CHOLHDL, LDLDIRECT in the last 72 hours. Thyroid Function Tests: No results for input(s): TSH, T4TOTAL, FREET4, T3FREE, THYROIDAB in the last 72 hours. Anemia Panel: No results for input(s): VITAMINB12, FOLATE, FERRITIN, TIBC, IRON, RETICCTPCT in the last 72 hours. Urine analysis:    Component Value Date/Time   COLORURINE YELLOW 03/25/2020 1614   APPEARANCEUR CLOUDY (A) 03/25/2020 1614   LABSPEC 1.010 03/25/2020 1614   PHURINE 5.0 03/25/2020 1614   GLUCOSEU 50 (A) 03/25/2020 1614   HGBUR SMALL (A) 03/25/2020 1614   BILIRUBINUR NEGATIVE 03/25/2020 1614   KETONESUR NEGATIVE 03/25/2020 1614   PROTEINUR NEGATIVE 03/25/2020 1614   UROBILINOGEN 0.2 11/22/2013 0945   NITRITE NEGATIVE 03/25/2020 1614   LEUKOCYTESUR LARGE (A) 03/25/2020 1614    Sepsis Labs: @LABRCNTIP (procalcitonin:4,lacticidven:4) ) Recent Results (from the past 240 hour(s))  Resp Panel by RT-PCR (Flu A&B, Covid) Nasopharyngeal Swab     Status: None   Collection Time: 03/18/20  3:10 PM   Specimen: Nasopharyngeal Swab; Nasopharyngeal(NP) swabs in vial transport medium  Result Value Ref Range Status   SARS Coronavirus 2 by RT PCR NEGATIVE NEGATIVE Final    Comment: (NOTE) SARS-CoV-2 target nucleic acids are NOT DETECTED.  The SARS-CoV-2 RNA is generally detectable in upper respiratory specimens during the acute phase of infection. The lowest concentration of SARS-CoV-2 viral copies this assay can detect is 138 copies/mL. A negative result does not preclude SARS-Cov-2 infection and should not be used as the sole basis for treatment or other patient management decisions. A negative result may occur with  improper specimen collection/handling, submission of specimen other than nasopharyngeal swab, presence of viral mutation(s) within the areas targeted by this assay, and inadequate number of viral copies(<138 copies/mL). A negative result must be combined with clinical observations, patient history, and epidemiological information. The expected result is Negative.  Fact Sheet for Patients:  05/16/20  Fact Sheet for Healthcare Providers:  BloggerCourse.com  This test is no t yet approved or cleared by the [place] FDA and  has been authorized for detection and/or diagnosis of SARS-CoV-2 by FDA under an Emergency Use Authorization (EUA). This EUA will remain  in effect (meaning this test can be used) for the duration of the COVID-19 declaration under Section 564(b)(1) of the Act, 21 U.S.C.section 360bbb-3(b)(1), unless the authorization is terminated  or revoked sooner.       Influenza A by PCR NEGATIVE NEGATIVE Final   Influenza B by PCR NEGATIVE NEGATIVE Final    Comment: (NOTE) The  Xpert Xpress SARS-CoV-2/FLU/RSV plus assay is intended as an aid in the diagnosis of influenza from Nasopharyngeal swab specimens and should not be used as a sole basis for treatment.  Nasal washings and aspirates are unacceptable for Xpert Xpress SARS-CoV-2/FLU/RSV testing.  Fact Sheet for Patients: BloggerCourse.comhttps://www.fda.gov/media/152166/download  Fact Sheet for Healthcare Providers: SeriousBroker.ithttps://www.fda.gov/media/152162/download  This test is not yet approved or cleared by the [place] FDA and has been authorized for detection and/or diagnosis of SARS-CoV-2 by FDA under an Emergency Use Authorization (EUA). This EUA will remain in effect (meaning this test can be used) for the duration of the COVID-19 declaration under Section 564(b)(1) of the Act, 21 U.S.C. section 360bbb-3(b)(1), unless the authorization is terminated or revoked.  Performed at [facility], [address] [place], [place] [address]   Blood culture (routine x 2)     Status: None   Collection Time: 03/18/20  3:11 PM   Specimen: BLOOD  Result Value Ref Range Status   Specimen Description BLOOD SITE NOT SPECIFIED  Final   Special Requests   Final    BOTTLES DRAWN AEROBIC AND ANAEROBIC Blood Culture results may not be optimal due to an inadequate volume of blood received in culture bottles   Culture   Final    NO GROWTH 5 DAYS Performed at [facility], [address] [place], [place] [address]    Report Status 03/23/2020 FINAL  Final  Blood culture (routine x 2)     Status: None   Collection Time: 03/18/20  3:11 PM   Specimen: BLOOD  Result Value Ref Range Status   Specimen Description BLOOD SITE NOT SPECIFIED  Final   Special Requests   Final    BOTTLES DRAWN AEROBIC AND ANAEROBIC Blood Culture results may not be optimal due to an inadequate volume of blood received in culture bottles   Culture   Final    NO GROWTH 5 DAYS Performed at [facility], [address] [place], [place] [address]     Report Status 03/23/2020 FINAL  Final  Culture, Urine     Status: Abnormal   Collection Time: 03/18/20  5:59 PM   Specimen: Urine, Catheterized  Result Value Ref Range Status   Specimen Description URINE, CATHETERIZED  Final   Special Requests Normal  Final   Culture (A)  Final    50,000 COLONIES/mL GROUP B STREP(S.AGALACTIAE)ISOLATED TESTING AGAINST S. AGALACTIAE NOT ROUTINELY PERFORMED DUE TO PREDICTABILITY OF AMP/PEN/VAN SUSCEPTIBILITY. Performed at [facility], [address] [place], [place] [address]    Report Status 03/19/2020 FINAL  Final  MRSA PCR Screening     Status: Abnormal   Collection Time: 03/18/20  6:35 PM   Specimen: Nasal Mucosa; Nasopharyngeal  Result Value Ref Range Status   MRSA by PCR POSITIVE (A) NEGATIVE Final    Comment:        The GeneXpert MRSA Assay (FDA approved for NASAL specimens only), is one component of a comprehensive MRSA colonization surveillance program. It is not intended to diagnose MRSA infection nor to guide or monitor treatment for MRSA infections. RESULT CALLED TO, READ BACK BY AND VERIFIED WITH: [facility],K RN AT 2041 03/18/2020 MITCHELL,L Performed at [facility], [address] [place], [place] [address]          Radiology Studies: US RENAL  Result Date: 03/25/2020 CLINICAL DATA:  87 year old female with renal failure. EXAM: RENAL / URINARY TRACT ULTRASOUND COMPLETE COMPARISON:  04/02/2019 CT FINDINGS: Right Kidney: Renal measurements: 9.9 x 5.7 x 5.2 cm = volume: 150 mL. Moderate to severe RIGHT hydronephrosis is identified. A 1.2 cm calculus in the region of the renal pelvis/possible  UPJ noted. Renal cortical thinning is noted. Left Kidney: Not visualized Bladder: Ureteral jets are not noted.  Otherwise appears normal. Other: None. IMPRESSION: Moderate to severe RIGHT hydronephrosis with a 1.2 cm calculus in the region of the renal pelvis/possible UPJ. Consider further evaluation with CT to evaluate site of  obstruction. LEFT kidney not visualized. These results will be called to the ordering clinician or representative by the Radiologist Assistant, and communication documented in the PACS or [employer]. Electronically Signed   By: Harmon Pier M.D.   On: 03/25/2020 11:45   CT CHEST ABDOMEN PELVIS WO CONTRAST  Result Date: 03/25/2020 CLINICAL DATA:  Weakness.  Acute kidney injury.  Hydronephrosis. EXAM: CT CHEST, ABDOMEN AND PELVIS WITHOUT CONTRAST TECHNIQUE: Multidetector CT imaging of the chest, abdomen and pelvis was performed following the standard protocol without IV contrast. COMPARISON:  January 19, 2020 FINDINGS: CT CHEST FINDINGS Cardiovascular: The heart size is unremarkable. There are coronary artery calcifications. There are atherosclerotic changes of the thoracic aorta without evidence for an aneurysm. There is a trace pericardial effusion. Mediastinum/Nodes: -- No mediastinal lymphadenopathy. -- No hilar lymphadenopathy. -- No axillary lymphadenopathy. -- No supraclavicular lymphadenopathy. -- Normal thyroid gland where visualized. -the enteric feeding tube terminates near the GE junction. Repositioning is recommended. Lungs/Pleura: There is scarring versus atelectasis at the lung bases. The trachea is unremarkable. There is no pneumothorax or large pleural effusion. Musculoskeletal: No chest wall abnormality. No bony spinal canal stenosis. CT ABDOMEN PELVIS FINDINGS Hepatobiliary: The liver is normal. Status post cholecystectomy.There is no biliary ductal dilation. Pancreas: Normal contours without ductal dilatation. No peripancreatic fluid collection. Spleen: Unremarkable. Adrenals/Urinary Tract: --Adrenal glands: Unremarkable. --Right kidney/ureter: There is severe right-sided hydronephrosis which is new since January 19, 2020. There is a nonobstructing stone in the renal pelvis. The level of obstruction appears to be at the right UPJ. --Left kidney/ureter: There is mild left-sided  hydroureteronephrosis without definite evidence for an obstructing stone. There is a punctate nonobstructing stone in the lower pole the left kidney. --Urinary bladder: The urinary bladder is significantly distended despite Foley catheter placement. Stomach/Bowel: --Stomach/Duodenum: No hiatal hernia or other gastric abnormality. Normal duodenal course and caliber. --Small bowel: Unremarkable. --Colon: Rectosigmoid diverticulosis without acute inflammation. --Appendix: Normal. Vascular/Lymphatic: Atherosclerotic calcification is present within the non-aneurysmal abdominal aorta, without hemodynamically significant stenosis. --No retroperitoneal lymphadenopathy. --No mesenteric lymphadenopathy. --No pelvic or inguinal lymphadenopathy. Reproductive: Unremarkable Other: There is a small volume of free fluid the patient's pelvis. The abdominal wall is normal. Musculoskeletal. Moderate degenerative changes are noted throughout the visualized thoracolumbar spine. There is diffuse osteopenia. No definite acute displaced fracture. IMPRESSION: 1. Severe right-sided hydronephrosis, new since January 19, 2020. 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 There is no obstructing stone visualized on this study. 2. Mild left-sided hydroureteronephrosis without definite evidence for an obstructing stone. 3. The urinary bladder is significantly distended despite Foley catheter placement. Query catheter malfunction. 4. Enteric feeding tube terminates near the GE junction. Repositioning is recommended. 5. Rectosigmoid diverticulosis without acute inflammation. 6. Small volume of free fluid the patient's pelvis. Aortic Atherosclerosis (ICD10-I70.0). Electronically Signed   By: Katherine Mantle M.D.   On: 03/25/2020 15:53      Scheduled Meds: . [START ON 03/27/2020] amantadine  50 mg Per Tube Daily  . [START ON 03/27/2020] amLODipine  5 mg Per Tube Daily  . chlorhexidine  15 mL Mouth Rinse BID  . Chlorhexidine  Gluconate Cloth  6 each Topical Daily  . clopidogrel  75  mg Per Tube Q breakfast  . feeding supplement (PROSource TF)  45 mL Per Tube TID  . free water  200 mL Per Tube Q4H  . heparin  5,000 Units Subcutaneous Q8H  . insulin aspart  0-9 Units Subcutaneous Q4H  . levothyroxine  112 mcg Per Tube QAC breakfast  . mouth rinse  15 mL Mouth Rinse q12n4p  . sodium chloride flush  3 mL Intravenous Once  . sodium zirconium cyclosilicate  10 g Oral Once   Continuous Infusions: . sodium chloride 10 mL/hr at 03/24/20 0305  . cefTRIAXone (ROCEPHIN)  IV 1 g (03/26/20 0946)  . feeding supplement (OSMOLITE 1.2 CAL) 45 mL/hr at 03/26/20 0011     LOS: 8 days      Calvert Cantor, MD [facility] Pager: [email or web address] 03/26/2020, 12:29 PM

## 2020-03-26 NOTE — Plan of Care (Signed)

## 2020-03-26 NOTE — Consult Note (Signed)
Urology Inpatient Progress Report  Unresponsive [R41.89] Acute encephalopathy [G93.40] Stroke-like symptoms [R29.90] Altered mental status, unspecified altered mental status type [R41.82]   Intv/Subj: CT scan obtained yesterday afternoon which demonstrated bilateral hydronephrosis.  The right side was significantly worse than the left, and there is no dilation of the ureter down towards the bladder.  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  Alternatively the patient may have developed bilateral hydronephrosis which appears worse on the right because of her extrarenal pelvis.  In addition, the CT scan showed a very distended bladder, despite having a Foley catheter.  The patient's kidney function has worsened.  The patient does not appear to be in any pain, no meaningful interaction or engagement was possible with the patient due to her mental status which appears unchanged from yesterday.  Principal Problem:   Acute encephalopathy Active Problems:   Diabetes mellitus (HCC)   Hypothyroidism   History of embolic stroke  Current Facility-Administered Medications  Medication Dose Route Frequency Provider Last Rate Last Admin  . 0.9 %  sodium chloride infusion  250 mL Intravenous Continuous Stretch, Marveen Reeks, MD 10 mL/hr at 03/24/20 0305 Restarted at 03/24/20 0305  . [START ON 03/27/2020] amantadine (SYMMETREL) 50 MG/5ML solution 50 mg  50 mg Per Tube Daily Bailey-Modzik, Delila A, NP      . [START ON 03/27/2020] amLODipine (NORVASC) tablet 5 mg  5 mg Per Tube Daily Etter Sjogren, MD      . cefTRIAXone (ROCEPHIN) 1 g in sodium chloride 0.9 % 100 mL IVPB  1 g Intravenous Q24H Darnell Level, MD 200 mL/hr at 03/26/20 0946 1 g at 03/26/20 0946  . chlorhexidine (PERIDEX) 0.12 % solution 15 mL  15 mL Mouth Rinse BID Karie Fetch P, DO   15 mL at 03/26/20 0835  . Chlorhexidine Gluconate Cloth 2 % PADS 6 each  6 each Topical Daily Steffanie Dunn, DO   6  each at 03/26/20 [phone]  . clopidogrel (PLAVIX) tablet 75 mg  75 mg Per Tube Q breakfast Leander Rams, RPH   75 mg at 03/26/20 6063  . docusate (COLACE) 50 MG/5ML liquid 100 mg  100 mg Per Tube BID PRN Kathlen Mody, MD      . feeding supplement (OSMOLITE 1.2 CAL) liquid 1,000 mL  1,000 mL Per Tube Continuous Olalere, Adewale A, MD 45 mL/hr at 03/26/20 0011 Infusion Verify at 03/26/20 0011  . feeding supplement (PROSource TF) liquid 45 mL  45 mL Per Tube TID Olalere, Adewale A, MD   45 mL at 03/26/20 0940  . free water 200 mL  200 mL Per Tube Q4H Rizwan, Saima, MD   200 mL at 03/26/20 0836  . heparin injection 5,000 Units  5,000 Units Subcutaneous Q8H Selmer Dominion B, NP   5,000 Units at 03/26/20 0160  . insulin aspart (novoLOG) injection 0-9 Units  0-9 Units Subcutaneous Q4H Selmer Dominion B, NP   2 Units at 03/26/20 [phone]  . levothyroxine (SYNTHROID) tablet 112 mcg  112 mcg Per Tube QAC breakfast Kathlen Mody, MD   112 mcg at 03/26/20 2355  . MEDLINE mouth rinse  15 mL Mouth Rinse q12n4p Karie Fetch P, DO   15 mL at 03/25/20 1700  . polyethylene glycol (MIRALAX / GLYCOLAX) packet 17 g  17 g Per Tube Daily PRN Kathlen Mody, MD      . sodium chloride flush (NS) 0.9 % injection 3 mL  3 mL Intravenous Once  Milagros Loll, MD      . sodium zirconium cyclosilicate [facility]) packet 10 g  10 g Oral Once Darnell Level, MD         Objective: Vital: Vitals:   03/25/20 2036 03/25/20 2342 03/26/20 0416 03/26/20 0817  BP: 132/60 (!) 138/55 (!) 147/61 136/65  Pulse: 73 72 82 80  Resp: Temp: 98.1 F (36.7 C) 99.3 F (37.4 C) 99.2 F (37.3 C) 98.6 F (37 C)  TempSrc: Oral Oral Oral Oral  SpO2: 99% 98% 98% 100%  Weight:      Height:       I/Os: I/O last 3 completed shifts: In: 3984.8 [I.V.:114.8; NG/GT:3870] Out: 1600 [Urine:1600]  Physical Exam:  General: Patient is in no apparent distress The patient is mumbling unintelligibly, feeding tube is in place, the patient is  in mitts Lungs: Normal respiratory effort, chest expands symmetrically. GI: The patient does not wince or complain with abdominal palpation. Foley: Is draining clear urine, but with manipulation I was able to drain approximately 200 cc of additional urine with the end of that being very sediment this and purulent Ext: lower extremities symmetric  Lab Results: Recent Labs    03/25/20 0648  WBC 6.5  HGB 11.2*  HCT 34.8*   Recent Labs    03/24/20 0310 03/25/20 0648 03/26/20 0726  NA 137 136 135  K 5.0 4.8 5.2*  CL 101 102 97*  CO2 GLUCOSE 186* 297* 187*  BUN 40* 43* 55*  CREATININE 2.05* 2.15* 2.67*  CALCIUM 9.0 9.7 9.2   No results for input(s): LABPT, INR in the last 72 hours. No results for input(s): LABURIN in the last 72 hours. Results for orders placed or performed during the hospital encounter of 03/18/20  Resp Panel by RT-PCR (Flu A&B, Covid) Nasopharyngeal Swab     Status: None   Collection Time: 03/18/20  3:10 PM   Specimen: Nasopharyngeal Swab; Nasopharyngeal(NP) swabs in vial transport medium  Result Value Ref Range Status   SARS Coronavirus 2 by RT PCR NEGATIVE NEGATIVE Final    Comment: (NOTE) SARS-CoV-2 target nucleic acids are NOT DETECTED.  The SARS-CoV-2 RNA is generally detectable in upper respiratory specimens during the acute phase of infection. The lowest concentration of SARS-CoV-2 viral copies this assay can detect is 138 copies/mL. A negative result does not preclude SARS-Cov-2 infection and should not be used as the sole basis for treatment or other patient management decisions. A negative result may occur with  improper specimen collection/handling, submission of specimen other than nasopharyngeal swab, presence of viral mutation(s) within the areas targeted by this assay, and inadequate number of viral copies(<138 copies/mL). A negative result must be combined with clinical observations, patient history, and  epidemiological information. The expected result is Negative.  Fact Sheet for Patients:  BloggerCourse.com  Fact Sheet for Healthcare Providers:  SeriousBroker.it  This test is no t yet approved or cleared by the [place] FDA and  has been authorized for detection and/or diagnosis of SARS-CoV-2 by FDA under an Emergency Use Authorization (EUA). This EUA will remain  in effect (meaning this test can be used) for the duration of the COVID-19 declaration under Section 564(b)(1) of the Act, 21 U.S.C.section 360bbb-3(b)(1), unless the authorization is terminated  or revoked sooner.       Influenza A by PCR NEGATIVE NEGATIVE Final   Influenza B by PCR NEGATIVE NEGATIVE Final    Comment: (NOTE) The Xpert  Xpress SARS-CoV-2/FLU/RSV plus assay is intended as an aid in the diagnosis of influenza from Nasopharyngeal swab specimens and should not be used as a sole basis for treatment. Nasal washings and aspirates are unacceptable for Xpert Xpress SARS-CoV-2/FLU/RSV testing.  Fact Sheet for Patients: BloggerCourse.comhttps://www.fda.gov/media/152166/download  Fact Sheet for Healthcare Providers: SeriousBroker.ithttps://www.fda.gov/media/152162/download  This test is not yet approved or cleared by the [place] FDA and has been authorized for detection and/or diagnosis of SARS-CoV-2 by FDA under an Emergency Use Authorization (EUA). This EUA will remain in effect (meaning this test can be used) for the duration of the COVID-19 declaration under Section 564(b)(1) of the Act, 21 U.S.C. section 360bbb-3(b)(1), unless the authorization is terminated or revoked.  Performed at [facility], [address] [place], [place] [address]   Blood culture (routine x 2)     Status: None   Collection Time: 03/18/20  3:11 PM   Specimen: BLOOD  Result Value Ref Range Status   Specimen Description BLOOD SITE NOT SPECIFIED  Final   Special Requests   Final    BOTTLES  DRAWN AEROBIC AND ANAEROBIC Blood Culture results may not be optimal due to an inadequate volume of blood received in culture bottles   Culture   Final    NO GROWTH 5 DAYS Performed at [facility], [address] [place], [place] [address]    Report Status 03/23/2020 FINAL  Final  Blood culture (routine x 2)     Status: None   Collection Time: 03/18/20  3:11 PM   Specimen: BLOOD  Result Value Ref Range Status   Specimen Description BLOOD SITE NOT SPECIFIED  Final   Special Requests   Final    BOTTLES DRAWN AEROBIC AND ANAEROBIC Blood Culture results may not be optimal due to an inadequate volume of blood received in culture bottles   Culture   Final    NO GROWTH 5 DAYS Performed at [facility], [address] [place], [place] [address]    Report Status 03/23/2020 FINAL  Final  Culture, Urine     Status: Abnormal   Collection Time: 03/18/20  5:59 PM   Specimen: Urine, Catheterized  Result Value Ref Range Status   Specimen Description URINE, CATHETERIZED  Final   Special Requests Normal  Final   Culture (A)  Final    50,000 COLONIES/mL GROUP B STREP(S.AGALACTIAE)ISOLATED TESTING AGAINST S. AGALACTIAE NOT ROUTINELY PERFORMED DUE TO PREDICTABILITY OF AMP/PEN/VAN SUSCEPTIBILITY. Performed at [facility], [address] [place], [place] [address]    Report Status 03/19/2020 FINAL  Final  MRSA PCR Screening     Status: Abnormal   Collection Time: 03/18/20  6:35 PM   Specimen: Nasal Mucosa; Nasopharyngeal  Result Value Ref Range Status   MRSA by PCR POSITIVE (A) NEGATIVE Final    Comment:        The GeneXpert MRSA Assay (FDA approved for NASAL specimens only), is one component of a comprehensive MRSA colonization surveillance program. It is not intended to diagnose MRSA infection nor to guide or monitor treatment for MRSA infections. RESULT CALLED TO, READ BACK BY AND VERIFIED WITH: [facility]LL[facility],K RN AT 2041 03/18/2020 MITCHELL,L Performed at [facility], [address] [place], [place] [address]     Studies/Results: US RENAL  Result Date: 03/25/2020 CLINICAL DATA:  87 year old female with renal failure. EXAM: RENAL / URINARY TRACT ULTRASOUND COMPLETE COMPARISON:  04/02/2019 CT FINDINGS: Right Kidney: Renal measurements: 9.9 x 5.7 x  5.2 cm = volume: 150 mL. Moderate to severe RIGHT hydronephrosis is identified. A 1.2 cm calculus in the region of the renal pelvis/possible UPJ noted. Renal cortical thinning is noted. Left Kidney: Not visualized Bladder: Ureteral jets are not noted.  Otherwise appears normal. Other: None. IMPRESSION: Moderate to severe RIGHT hydronephrosis with a 1.2 cm calculus in the region of the renal pelvis/possible UPJ. Consider further evaluation with CT to evaluate site of obstruction. LEFT kidney not visualized. These results will be called to the ordering clinician or representative by the Radiologist Assistant, and communication documented in the PACS or [employer]. Electronically Signed   By: Harmon Pier M.D.   On: 03/25/2020 11:45   CT CHEST ABDOMEN PELVIS WO CONTRAST  Result Date: 03/25/2020 CLINICAL DATA:  Weakness.  Acute kidney injury.  Hydronephrosis. EXAM: CT CHEST, ABDOMEN AND PELVIS WITHOUT CONTRAST TECHNIQUE: Multidetector CT imaging of the chest, abdomen and pelvis was performed following the standard protocol without IV contrast. COMPARISON:  January 19, 2020 FINDINGS: CT CHEST FINDINGS Cardiovascular: The heart size is unremarkable. There are coronary artery calcifications. There are atherosclerotic changes of the thoracic aorta without evidence for an aneurysm. There is a trace pericardial effusion. Mediastinum/Nodes: -- No mediastinal lymphadenopathy. -- No hilar lymphadenopathy. -- No axillary lymphadenopathy. -- No supraclavicular lymphadenopathy. -- Normal thyroid gland where visualized. -the enteric feeding tube terminates near the GE junction. Repositioning is recommended. Lungs/Pleura: There  is scarring versus atelectasis at the lung bases. The trachea is unremarkable. There is no pneumothorax or large pleural effusion. Musculoskeletal: No chest wall abnormality. No bony spinal canal stenosis. CT ABDOMEN PELVIS FINDINGS Hepatobiliary: The liver is normal. Status post cholecystectomy.There is no biliary ductal dilation. Pancreas: Normal contours without ductal dilatation. No peripancreatic fluid collection. Spleen: Unremarkable. Adrenals/Urinary Tract: --Adrenal glands: Unremarkable. --Right kidney/ureter: There is severe right-sided hydronephrosis which is new since January 19, 2020. There is a nonobstructing stone in the renal pelvis. The level of obstruction appears to be at the right UPJ. --Left kidney/ureter: There is mild left-sided hydroureteronephrosis without definite evidence for an obstructing stone. There is a punctate nonobstructing stone in the lower pole the left kidney. --Urinary bladder: The urinary bladder is significantly distended despite Foley catheter placement. Stomach/Bowel: --Stomach/Duodenum: No hiatal hernia or other gastric abnormality. Normal duodenal course and caliber. --Small bowel: Unremarkable. --Colon: Rectosigmoid diverticulosis without acute inflammation. --Appendix: Normal. Vascular/Lymphatic: Atherosclerotic calcification is present within the non-aneurysmal abdominal aorta, without hemodynamically significant stenosis. --No retroperitoneal lymphadenopathy. --No mesenteric lymphadenopathy. --No pelvic or inguinal lymphadenopathy. Reproductive: Unremarkable Other: There is a small volume of free fluid the patient's pelvis. The abdominal wall is normal. Musculoskeletal. Moderate degenerative changes are noted throughout the visualized thoracolumbar spine. There is diffuse osteopenia. No definite acute displaced fracture. IMPRESSION: 1. Severe right-sided hydronephrosis, new since January 19, 2020. 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 There is no  obstructing stone visualized on this study. 2. Mild left-sided hydroureteronephrosis without definite evidence for an obstructing stone. 3. The urinary bladder is significantly distended despite Foley catheter placement. Query catheter malfunction. 4. Enteric feeding tube terminates near the GE junction. Repositioning is recommended. 5. Rectosigmoid diverticulosis without acute inflammation. 6. Small volume of free fluid the patient's pelvis. Aortic Atherosclerosis (ICD10-I70.0). Electronically Signed   By: Katherine Mantle M.D.   On: 03/25/2020 15:53    Assessment: I reviewed the patient's CT scan and discussed it with both interventional radiology and nephrology.  On the CT scan she has mild left-sided hydronephrosis and moderate  right-sided hydronephrosis.  Her bladder is also very distended on the CT scan despite the Foley catheter.  I am not sure why she has bilateral hydronephrosis now, as there is no evidence of obstruction on her CT scan.  Perhaps her Foley catheter is not been draining well and a lot of the urine is backed up creating the dilation of the collecting system.  It may just appear worse on the right because of her extrarenal pelvis on that side.  Alternatively she may have developed obstruction of for unclear reasons.  Either way her kidney function has not improved.  There does not appear to be any medical reason for her to otherwise be developing worsening renal function.  Plan: I agree with repeating the ultrasound in the morning.  Hopefully this will demonstrate improved hydronephrosis.  Additionally, it will allow [place] more time to repeat her creatinine and see if she is starting to improve.  Although nephrostomy tubes may be the appropriate next step in terms of preventing her renal function from worsening, I think it is worth having a discussion with the family to see if this is ultimately what she would like to do based on previous wishes.  May be worth involving palliative care  to ascertain goals of care with this patient who does not seem to be progressing from neurologic perspective after her stroke.   Berniece Salines, MD Urology 03/26/2020, 12:56 PM

## 2020-03-26 NOTE — Progress Notes (Addendum)
Daily Progress Note   Patient Name: Hannah Fischer       Date: 03/26/2020 DOB: [birth year]  Age: 87 y.o. MRN#: [id number] Attending Physician: Calvert Cantor, MD Primary Care Physician: Sigmund Hazel, MD Admit Date: 03/18/2020  Reason for Consultation/Follow-up: Establishing goals of care  Subjective: Patient is resting in bed with eyes closed, feeding tube and wrist restraints in place. Husband, children, and their spouses are at bedside. Patient does not respond to them. Stepped into meeting room.   We discussed her diagnoses, prognosis, GOC, EOL wishes disposition and options.  Created space and opportunity for patient  to explore thoughts and feelings regarding current medical information.   A detailed discussion was had today regarding advanced directives.  Concepts specific to code status, artifical feeding and hydration, IV antibiotics and rehospitalization were discussed.  The difference between an aggressive medical intervention path and a comfort care path was discussed.  Values and goals of care important to patient and family were attempted to be elicited.  Discussed limitations of medical interventions to prolong quality of life in some situations and discussed the concept of human mortality.  He states he does not want her to suffer. We discuss concerns about her current and future QOL as prior to hospitalization she was playing corn hole and bowling. He confirms DNR. He states he would not want to place her on a ventilator; he would not want dialysis though he would be amenable to nephrostomy tubes. He likely would not want a feeding tube. He remains hopeful she will get better, as does other family members present. Getting better means "not being bedridden and having to have people do  things for you." He states they value their independence and want to live, not exist. He would like a little more time to see how she does as she was more awake over the past few days. Questions answered.    Length of Stay: 8  Current Medications: Scheduled Meds:  . [START ON 03/27/2020] amantadine  50 mg Per Tube Daily  . [START ON 03/27/2020] amLODipine  5 mg Per Tube Daily  . chlorhexidine  15 mL Mouth Rinse BID  . Chlorhexidine Gluconate Cloth  6 each Topical Daily  . clopidogrel  75 mg Per Tube Q breakfast  . feeding supplement (PROSource TF)  45 mL Per  Tube TID  . free water  200 mL Per Tube Q4H  . heparin  5,000 Units Subcutaneous Q8H  . insulin aspart  0-9 Units Subcutaneous Q4H  . levothyroxine  112 mcg Per Tube QAC breakfast  . mouth rinse  15 mL Mouth Rinse q12n4p  . sodium chloride flush  3 mL Intravenous Once    Continuous Infusions: . sodium chloride 10 mL/hr at 03/24/20 0305  . cefTRIAXone (ROCEPHIN)  IV 1 g (03/26/20 0946)  . feeding supplement (OSMOLITE 1.2 CAL) 45 mL/hr at 03/26/20 0011    PRN Meds: docusate, polyethylene glycol  Physical Exam Constitutional:      Comments: Eyes closed.  Pulmonary:     Effort: Pulmonary effort is normal.             Vital Signs: BP 136/65 (BP Location: Right Arm)   Pulse 80   Temp 98.6 F (37 C) (Oral)   Resp 18   Ht 5\' 3"  (1.6 m)   Wt 104.6 kg   SpO2 100%   BMI 40.85 kg/m  SpO2: SpO2: 100 % O2 Device: O2 Device: Room Air O2 Flow Rate: O2 Flow Rate (L/min): 1 L/min  Intake/output summary:   Intake/Output Summary (Last 24 hours) at 03/26/2020 1606 Last data filed at 03/26/2020 1349 Gross per 24 hour  Intake 3984.76 ml  Output 2750 ml  Net 1234.76 ml   LBM: Last BM Date: 04/22/20 Baseline Weight: Weight: 78.9 kg Most recent weight: Weight: 104.6 kg        Patient Active Problem List   Diagnosis Date Noted  . History of embolic stroke 03/22/2020  . Acute encephalopathy 03/18/2020  . Gait abnormality  01/19/2020  . Chest pressure 01/19/2020  . Cerebral amyloid angiopathy (CODE)   . Stroke (HCC) 11/24/2019  . AKI (acute kidney injury) (HCC) 11/24/2019  . Spinal stenosis of lumbar region with neurogenic claudication 10/26/2019  . Abnormality of gait 08/02/2019  . Cerebrovascular accident (CVA) (HCC) 07/22/2019  . Dizziness 06/15/2019  . Brain aneurysm 06/15/2019  . Chronic pain syndrome 04/29/2019  . Chronic right-sided low back pain without sciatica 04/12/2019  . Arthritis of shoulder region, left, degenerative 12/03/2013  . Nonspecific abnormal electrocardiogram (ECG) (EKG) 10/15/2013  . Other and unspecified angina pectoris 10/15/2013  . TIA (transient ischemic attack) 11/15/2011  . HTN (hypertension) 11/15/2011  . Diabetes mellitus (HCC) 11/15/2011  . Hypothyroidism 11/15/2011  . Dyslipidemia 11/15/2011  . GERD (gastroesophageal reflux disease) 11/15/2011  . Pyuria 11/15/2011    Palliative Care Assessment & Plan    Recommendations/Plan:  Husband would like more time to see how she does.   DNR/DNI. No dialsysis, likely no PEG.   Code Status:    Code Status Orders  (From admission, onward)         Start     Ordered   03/21/20 1325  Do not attempt resuscitation (DNR)  Continuous       Question Answer Comment  In the event of cardiac or respiratory ARREST Do not call a "code blue"   In the event of cardiac or respiratory ARREST Do not perform Intubation, CPR, defibrillation or ACLS   In the event of cardiac or respiratory ARREST Use medication by any route, position, wound care, and other measures to relive pain and suffering. May use oxygen, suction and manual treatment of airway obstruction as needed for comfort.      03/21/20 1324        Code Status History    Date Active  Date Inactive Code Status Order ID Comments User Context   03/18/2020 1733 03/21/2020 1324 Full Code [id number]  Norton Blizzard, NP ED   11/24/2019 1015 11/26/2019 1913 Full Code [id number]   Jae Dire, MD ED   12/03/2013 1543 12/04/2013 1739 Full Code [id number]  Verlee Rossetti, MD Inpatient   10/18/2013 1041 10/18/2013 1831 Full Code [id number]  Corky Crafts, MD Inpatient   11/15/2011 2057 11/17/2011 1547 Full Code [id number]  Laveda Norman, MD ED   12/25/2010 1720 12/27/2010 1639 Full Code [id number]  Rosanne Sack, RN Inpatient   Advance Care Planning Activity       Prognosis:  Poor   Care plan was discussed with attending team by epic chat.  Thank you for allowing the Palliative Medicine Team to assist in the care of this patient.   Time In: 2:30 Time Out: 3:30 Total Time 60 min Prolonged Time Billed no      Greater than 50%  of this time was spent counseling and coordinating care related to the above assessment and plan.  Morton Stall, NP  Please contact Palliative Medicine Team phone at [phone] for questions and concerns.

## 2020-03-26 NOTE — Plan of Care (Signed)
  Problem: Health Behavior/Discharge Planning: Goal: Ability to manage health-related needs will improve Outcome: Progressing   Problem: Health Behavior/Discharge Planning: Goal: Ability to manage health-related needs will improve Outcome: Progressing   Problem: Education: Goal: Knowledge of General Education information will improve Description: Including pain rating scale, medication(s)/side effects and non-pharmacologic comfort measures Outcome: Progressing

## 2020-03-27 ENCOUNTER — Inpatient Hospital Stay (HOSPITAL_COMMUNITY): Payer: Medicare Other

## 2020-03-27 DIAGNOSIS — Z515 Encounter for palliative care: Secondary | ICD-10-CM | POA: Diagnosis not present

## 2020-03-27 DIAGNOSIS — N179 Acute kidney failure, unspecified: Secondary | ICD-10-CM | POA: Diagnosis not present

## 2020-03-27 DIAGNOSIS — R338 Other retention of urine: Secondary | ICD-10-CM | POA: Diagnosis not present

## 2020-03-27 DIAGNOSIS — Z7189 Other specified counseling: Secondary | ICD-10-CM | POA: Diagnosis not present

## 2020-03-27 HISTORY — PX: IR US GUIDANCE: IMG2393

## 2020-03-27 HISTORY — PX: IR NEPHROSTOMY PLACEMENT RIGHT: IMG6064

## 2020-03-27 LAB — RENAL FUNCTION PANEL
Albumin: 2.5 g/dL — ABNORMAL LOW (ref 3.5–5.0)
Anion gap: 12 (ref 5–15)
BUN: 61 mg/dL — ABNORMAL HIGH (ref 8–23)
CO2: 26 mmol/L (ref 22–32)
Calcium: 9.2 mg/dL (ref 8.9–10.3)
Chloride: 100 mmol/L (ref 98–111)
Creatinine, Ser: 2.52 mg/dL — ABNORMAL HIGH (ref 0.44–1.00)
GFR, Estimated: 18 mL/min — ABNORMAL LOW (ref >60)
Glucose, Bld: 155 mg/dL — ABNORMAL HIGH (ref 70–99)
Phosphorus: 6.8 mg/dL — ABNORMAL HIGH (ref 2.5–4.6)
Potassium: 5.2 mmol/L — ABNORMAL HIGH (ref 3.5–5.1)
Sodium: 138 mmol/L (ref 135–145)

## 2020-03-27 LAB — GLUCOSE, CAPILLARY
Glucose-Capillary: 124 mg/dL — ABNORMAL HIGH (ref 70–99)
Glucose-Capillary: 148 mg/dL — ABNORMAL HIGH (ref 70–99)
Glucose-Capillary: 164 mg/dL — ABNORMAL HIGH (ref 70–99)
Glucose-Capillary: 166 mg/dL — ABNORMAL HIGH (ref 70–99)
Glucose-Capillary: 168 mg/dL — ABNORMAL HIGH (ref 70–99)
Glucose-Capillary: 214 mg/dL — ABNORMAL HIGH (ref 70–99)

## 2020-03-27 LAB — CBC
HCT: 35.9 % — ABNORMAL LOW (ref 36.0–46.0)
Hemoglobin: 11.7 g/dL — ABNORMAL LOW (ref 12.0–15.0)
MCH: 33 pg (ref 26.0–34.0)
MCHC: 32.6 g/dL (ref 30.0–36.0)
MCV: 101.1 fL — ABNORMAL HIGH (ref 80.0–100.0)
Platelets: 222 10*3/uL (ref 150–400)
RBC: 3.55 MIL/uL — ABNORMAL LOW (ref 3.87–5.11)
RDW: 13.2 % (ref 11.5–15.5)
WBC: 5.8 10*3/uL (ref 4.0–10.5)
nRBC: 0 % (ref 0.0–0.2)

## 2020-03-27 LAB — URINE CULTURE: Culture: >=100000 — AB

## 2020-03-27 LAB — PROTIME-INR
INR: 1 (ref 0.8–1.2)
Prothrombin Time: 12.8 seconds (ref 11.4–15.2)

## 2020-03-27 MED ORDER — SODIUM ZIRCONIUM CYCLOSILICATE 10 G PO PACK
10.0000 g | PACK | Freq: Once | ORAL | Status: AC
  Administered 2020-03-27: 10 g
  Filled 2020-03-27: qty 1

## 2020-03-27 MED ORDER — MIDAZOLAM HCL 2 MG/2ML IJ SOLN
INTRAMUSCULAR | Status: AC
  Filled 2020-03-27: qty 2

## 2020-03-27 MED ORDER — LIDOCAINE HCL 1 % IJ SOLN
INTRAMUSCULAR | Status: AC
  Filled 2020-03-27: qty 20

## 2020-03-27 MED ORDER — HEPARIN SODIUM (PORCINE) 5000 UNIT/ML IJ SOLN
5000.0000 [IU] | Freq: Three times a day (TID) | INTRAMUSCULAR | Status: AC
  Administered 2020-03-28 – 2020-04-16 (×60): 5000 [IU] via SUBCUTANEOUS
  Filled 2020-03-27 (×61): qty 1

## 2020-03-27 MED ORDER — MIDAZOLAM HCL 2 MG/2ML IJ SOLN
INTRAMUSCULAR | Status: AC | PRN
  Administered 2020-03-27 (×2): .25 mg via INTRAVENOUS
  Administered 2020-03-27: 0.5 mg via INTRAVENOUS

## 2020-03-27 MED ORDER — SODIUM CHLORIDE 0.9 % IV SOLN: INTRAVENOUS | Status: AC

## 2020-03-27 MED ORDER — IOHEXOL 300 MG/ML  SOLN
50.0000 mL | Freq: Once | INTRAMUSCULAR | Status: AC | PRN
  Administered 2020-03-27: 10 mL

## 2020-03-27 MED ORDER — IOHEXOL 300 MG/ML  SOLN: 50.0000 mL | Freq: Once | INTRAMUSCULAR | Status: DC | PRN

## 2020-03-27 MED ORDER — LIDOCAINE HCL (PF) 1 % IJ SOLN
INTRAMUSCULAR | Status: AC | PRN
  Administered 2020-03-27: 10 mL

## 2020-03-27 NOTE — Progress Notes (Addendum)
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  Palliative care was consulted to aid in goals of care conversations.  Today's Discussion (03/27/2020):  *Please note that this is a verbal dictation therefore any spelling or grammatical errors are due to the "[employer]" system interpretation.  Chart reviewed.   Hasset is not greatly responsive to me this morning. She remains to not be able to speak or open her eyes on command.   I met with [name] at bedside this morning.  I shared with him my concerns for Deklyn's overall recovery. I shared with him that in the past few days I have not noticed any tremendous improvements. I brought up my concerns about her long term nutritional support. I shared with him that even if a PEG Tube were placed it would not necessarily mean she would make a meaningful recovery and in many ways may delays her bodies natural processes. He was tearful in hearing this information. I said that it might be wise for [place] to coordinate another meeting midweek to see where we are at and decide on the next best steps.  I brought to [name]'s attention what comfort focused care would look like if that is the direction we decided to go later in the week. He shares that he cannot make this decision alone but he does agree that he would not want his wife to get additional needle sticks and or diagnostic tests if it were not going to result in a greater outcome.   Questions and concerns addressed   ____________________________________________________________ Addendum:  Dr. Wynelle Cleveland communicated with PMT via secure chat. Shared the plan for patient to get a nephrostomy tube today. We discussed allowing her a few more days thereafter to see if her cognition improves. Will ask to meet with [name] again on Friday as opposed to midweek.   Objective Assessment: Vital Signs Vitals:   03/27/20 0809 03/27/20 1100  BP: 134/70 (!) 146/73  Pulse: 78 83  Resp: 18 16  Temp: 98.5 F (36.9 C) 98.2 F (36.8 C)  SpO2: 100% 100%    Intake/Output Summary (Last 24 hours) at 03/27/2020 1223 Last data filed at 03/27/2020 8299 Gross per 24 hour  Intake 225 ml  Output 2800 ml  Net -2575 ml   Last Weight  Most recent update: 03/25/2020  6:32 AM   Weight  104.6 kg (230 lb 9.6 oz)           Gen:  Elderly F in NAD HEENT:Coretrack in place, moist mucous membranes CV: Regular rate and rhythm  PULM: RA ABD: soft/nontender  EXT: No edema  Neuro:  Somnolent can follow basic commands though does not open eyes or speak  SUMMARY OF RECOMMENDATIONS DNAR/DNI  MOST Completed, paper copy placed onto the chart electric copy can be found in [place]  DNR Form Completed, paper copy placed onto the chart electric copy can be found in [facility]  Continue present treatments to see if improvements can be made   Will need to coordinate another family meeting on Friday to determine next steps - Patients husband remains cautiously optimistic for improvements  Ongoing  PMT support  Time Spent: 35 Greater than 50% of the time was spent in counseling and coordination of care ______________________________________________________________________________________ [place] Team Team Cell Phone: [phone] Please utilize secure chat with additional questions, if there is no response within 30 minutes please call the above phone number  Palliative Medicine Team providers  are available by phone from 7am to 7pm daily and can be reached through the team cell phone.  Should this patient require assistance outside of these hours, please call the patient's attending physician.

## 2020-03-27 NOTE — Progress Notes (Signed)
PROGRESS NOTE    Hannah Fischer   WLS:[id number]  DOB: [birth year]  DOA: 03/18/2020 PCP: Sigmund Hazel, MD   Brief Narrative:  Hannah Fischer is an 87 year old lady with hypertension, hyperlipidemia, diabetes mellitus type 2, embolic CVAs with residual left hand weakness, hypothyroidism, GERD, PE who presents to the hospital for increased lethargy.  At that she had gone to bed last night and in the morning he had trouble waking her up. In the ED she was noted to be responsive to pain.  A code stroke was called.  She was outside the window for TPA.  An emergent CT did not show any acute abnormality.  A CTA was performed and did not reveal any obvious stenosis. An MRI of the brain was performed on 2/13:Punctate acute infarction in the left superior cerebellumadjacent to the superior cerebellar peduncle. Bilateral thalamic acute infarction extending back into the mid brain. This is consistent with artery of [place]on infarction.  Subjective: She is still not awakening fully. She has no complaints.   Assessment & Plan:   Principal Problem:   Acute encephalopathy secondary to bilateral thalamic infarcts -Prior history of embolic infarcts -The patient currently has an NG tube  -Neurology and the stroke team have seen her -Started on amantadine and Ritalin to help improve her lethargy- she is now more easy to awaken and does follow commands and try to communicate but continues to sleep most of the day and prefers to keep eyes closed -Due to her lethargy, she has a cor track and is receiving Osmolite -Palliative care has been consulted and the family would like to continue aggressive care but does not want CPR or shocks and thus she is a DNR -I appreciate palliative care following and speaking with family about the goals of care- The patient's husband will discuss further plans for PEG vs comfort care with his children. We will wait at least until Thursday or Friday to see if she will awaken  (after nephrostomy placed and renal function improves).  Active Problems: Acute metabolic encephalopathy - waking up from her brain injury and agitated- have asked for a sitter - sitter not available- she is in soft wrist restrains for now as she pulled out her IV.  CKD 3A- AKI due to dehydration -Baseline creatinine appears to be 1.0-1.3 -started free water per tube and watching I & O closely to prevent heart failure exacerbation - BUN Cr continue to rise despite adequate hydration - 2/19>-  renal ultrasound showed right sided hydronephrosis and nephrolithiasis with a UPJ stone - have consulted nephrology and urology- plans are for IR nephrostomy tube today   Grade 2 diastolic heart failure-chronic -2D echo during this admission reveals grade 2 diastolic dysfunction - following fluid status closely     Diabetes mellitus 2 -Continue on insulin sliding scale every 4 hours while she is on tube feeds Component Value Date/Time   HGBA1C 7.6 (H) 03/18/2020 1936      Hypothyroidism -Continue Synthroid per tube    Time spent in minutes: 35 DVT prophylaxis: Heparin Code Status: DO NOT RESUSCITATE Family Communication: Husband-  Level of Care: Level of care: Progressive Disposition Plan:  Status is: Inpatient  Remains inpatient appropriate because:IV treatments appropriate due to intensity of illness or inability to take PO   Dispo: The patient is from: Home              Anticipated d/c is to: To be determined  Anticipated d/c date is: > 3 days              Patient currently is not medically stable to d/c.   Difficult to place patient No   Consultants:   Neurology  Urology  Nephrology Procedures:   Cor trac Antimicrobials:  Anti-infectives (From admission, onward)   Start     Dose/Rate Route Frequency Ordered Stop   03/26/20 0730  cefTRIAXone (ROCEPHIN) 1 g in sodium chloride 0.9 % 100 mL IVPB        1 g 200 mL/hr over 30 Minutes Intravenous Every 24  hours 03/26/20 0644 03/31/20 0729   03/18/20 1900  azithromycin (ZITHROMAX) 500 mg in sodium chloride 0.9 % 250 mL IVPB  Status:  Discontinued        500 mg 250 mL/hr over 60 Minutes Intravenous Every 24 hours 03/18/20 1830 03/19/20 1106   03/18/20 1900  cefTRIAXone (ROCEPHIN) 2 g in sodium chloride 0.9 % 100 mL IVPB  Status:  Discontinued        2 g 200 mL/hr over 30 Minutes Intravenous Every 24 hours 03/18/20 1830 03/19/20 1106       Objective: Vitals:   03/27/20 0348 03/27/20 0700 03/27/20 0809 03/27/20 1100  BP: 139/68 123/90 134/70 (!) 146/73  Pulse: 80 90 78 83  Resp: 18 18 18 16   Temp: 97.6 F (36.4 C) 97.8 F (36.6 C) 98.5 F (36.9 C) 98.2 F (36.8 C)  TempSrc:  Oral Axillary Axillary  SpO2: 99% 100% 100% 100%  Weight:      Height:        Intake/Output Summary (Last 24 hours) at 03/27/2020 1235 Last data filed at 03/27/2020 0415 Gross per 24 hour  Intake 225 ml  Output 2800 ml  Net -2575 ml   Filed Weights   03/19/20 0400 03/23/20 0349 03/25/20 0500  Weight: 80.2 kg 79.3 kg 104.6 kg    Examination: General exam: Appears comfortable  HEENT: PERRLA, oral mucosa moist, no sclera icterus or thrush- cor trac present Respiratory system: Clear to auscultation. Respiratory effort normal. Cardiovascular system: S1 & S2 heard, regular rate and rhythm Gastrointestinal system: Abdomen soft, non-tender, nondistended. Normal bowel sounds   Central nervous system: does not awaken except for a moment Extremities: No cyanosis, clubbing or edema Skin: No rashes or ulcers Psychiatry:  cannot assess  Data Reviewed: I have personally reviewed following labs and imaging studies  CBC: Recent Labs  Lab 03/21/20 0534 03/25/20 0648 03/27/20 1009  WBC 5.6 6.5 5.8  HGB 13.0 11.2* 11.7*  HCT 38.8 34.8* 35.9*  MCV 99.0 98.9 101.1*  PLT 187 208 222   Basic Metabolic Panel: Recent Labs  Lab 03/20/20 1640 03/20/20 1640 03/21/20 0534 03/21/20 1701 03/22/20 0406  03/23/20 0333 03/24/20 0310 03/25/20 0648 03/26/20 0726 03/27/20 0225  NA  --    < > 141  --  143 142 137 136 135 138  K  --    < > 3.9  --  4.4 4.9 5.0 4.8 5.2* 5.2*  CL  --    < > 105  --  107 106 101 102 97* 100  CO2  --    < > 25  --  27 23 24 24 27 26   GLUCOSE  --    < > 191*  --  193* 194* 186* 297* 187* 155*  BUN  --    < > 27*  --  32* 39* 40* 43* 55* 61*  CREATININE  --    < >  1.75*  --  1.85* 2.04* 2.05* 2.15* 2.67* 2.52*  CALCIUM  --    < > 8.9  --  8.9 9.1 9.0 9.7 9.2 9.2  MG 2.1  --  2.0 2.1 2.2  --   --   --   --   --   PHOS 3.9  --  3.9 3.9 3.9  --   --   --  5.7* 6.8*   < > = values in this interval not displayed.   GFR: Estimated Creatinine Clearance: 18.9 mL/min (A) (by C-G formula based on SCr of 2.52 mg/dL (H)). Liver Function Tests: Recent Labs  Lab 03/26/20 0726 03/27/20 0225  ALBUMIN 2.5* 2.5*   No results for input(s): LIPASE, AMYLASE in the last 168 hours. No results for input(s): AMMONIA in the last 168 hours. Coagulation Profile: Recent Labs  Lab 03/27/20 1009  INR 1.0   Cardiac Enzymes: No results for input(s): CKTOTAL, CKMB, CKMBINDEX, TROPONINI in the last 168 hours. BNP (last 3 results) No results for input(s): PROBNP in the last 8760 hours. HbA1C: No results for input(s): HGBA1C in the last 72 hours. CBG: Recent Labs  Lab 03/26/20 1923 03/27/20 0011 03/27/20 0351 03/27/20 0812 03/27/20 1157  GLUCAP 156* 166* 124* 164* 148*   Lipid Profile: No results for input(s): CHOL, HDL, LDLCALC, TRIG, CHOLHDL, LDLDIRECT in the last 72 hours. Thyroid Function Tests: No results for input(s): TSH, T4TOTAL, FREET4, T3FREE, THYROIDAB in the last 72 hours. Anemia Panel: No results for input(s): VITAMINB12, FOLATE, FERRITIN, TIBC, IRON, RETICCTPCT in the last 72 hours. Urine analysis:    Component Value Date/Time   COLORURINE YELLOW 03/25/2020 1614   APPEARANCEUR CLOUDY (A) 03/25/2020 1614   LABSPEC 1.010 03/25/2020 1614   PHURINE 5.0  03/25/2020 1614   GLUCOSEU 50 (A) 03/25/2020 1614   HGBUR SMALL (A) 03/25/2020 1614   BILIRUBINUR NEGATIVE 03/25/2020 1614   KETONESUR NEGATIVE 03/25/2020 1614   PROTEINUR NEGATIVE 03/25/2020 1614   UROBILINOGEN 0.2 11/22/2013 0945   NITRITE NEGATIVE 03/25/2020 1614   LEUKOCYTESUR LARGE (A) 03/25/2020 1614   Sepsis Labs: (procalcitonin:4,lacticidven:4) ) Recent Results (from the past 240 hour(s))  Resp Panel by RT-PCR (Flu A&B, Covid) Nasopharyngeal Swab     Status: None   Collection Time: 03/18/20  3:10 PM   Specimen: Nasopharyngeal Swab; Nasopharyngeal(NP) swabs in vial transport medium  Result Value Ref Range Status   SARS Coronavirus 2 by RT PCR NEGATIVE NEGATIVE Final    Comment: (NOTE) SARS-CoV-2 target nucleic acids are NOT DETECTED.  The SARS-CoV-2 RNA is generally detectable in upper respiratory specimens during the acute phase of infection. The lowest concentration of SARS-CoV-2 viral copies this assay can detect is 138 copies/mL. A negative result does not preclude SARS-Cov-2 infection and should not be used as the sole basis for treatment or other patient management decisions. A negative result may occur with  improper specimen collection/handling, submission of specimen other than nasopharyngeal swab, presence of viral mutation(s) within the areas targeted by this assay, and inadequate number of viral copies(<138 copies/mL). A negative result must be combined with clinical observations, patient history, and epidemiological information. The expected result is Negative.  Fact Sheet for Patients:  BloggerCourse.com  Fact Sheet for Healthcare Providers:  SeriousBroker.it  This test is no t yet approved or cleared by the [place] FDA and  has been authorized for detection and/or diagnosis of SARS-CoV-2 by FDA under an Emergency Use Authorization (EUA). This EUA will remain  in effect (meaning this test  can be used) for the duration of the COVID-19 declaration under Section 564(b)(1) of the Act, 21 U.S.C.section 360bbb-3(b)(1), unless the authorization is terminated  or revoked sooner.       Influenza A by PCR NEGATIVE NEGATIVE Final   Influenza B by PCR NEGATIVE NEGATIVE Final    Comment: (NOTE) The Xpert Xpress SARS-CoV-2/FLU/RSV plus assay is intended as an aid in the diagnosis of influenza from Nasopharyngeal swab specimens and should not be used as a sole basis for treatment. Nasal washings and aspirates are unacceptable for Xpert Xpress SARS-CoV-2/FLU/RSV testing.  Fact Sheet for Patients: BloggerCourse.com  Fact Sheet for Healthcare Providers: SeriousBroker.it  This test is not yet approved or cleared by the [place] FDA and has been authorized for detection and/or diagnosis of SARS-CoV-2 by FDA under an Emergency Use Authorization (EUA). This EUA will remain in effect (meaning this test can be used) for the duration of the COVID-19 declaration under Section 564(b)(1) of the Act, 21 U.S.C. section 360bbb-3(b)(1), unless the authorization is terminated or revoked.  Performed at [facility], [address], [place], [place] [address]   Blood culture (routine x 2)     Status: None   Collection Time: 03/18/20  3:11 PM   Specimen: BLOOD  Result Value Ref Range Status   Specimen Description BLOOD SITE NOT SPECIFIED  Final   Special Requests   Final    BOTTLES DRAWN AEROBIC AND ANAEROBIC Blood Culture results may not be optimal due to an inadequate volume of blood received in culture bottles   Culture   Final    NO GROWTH 5 DAYS Performed at [facility], [address], [place], [place] [address]    Report Status 03/23/2020 FINAL  Final  Blood culture (routine x 2)     Status: None   Collection Time: 03/18/20  3:11 PM   Specimen: BLOOD  Result Value Ref Range Status   Specimen Description BLOOD SITE  NOT SPECIFIED  Final   Special Requests   Final    BOTTLES DRAWN AEROBIC AND ANAEROBIC Blood Culture results may not be optimal due to an inadequate volume of blood received in culture bottles   Culture   Final    NO GROWTH 5 DAYS Performed at [facility], [address] [place], [place] [address]    Report Status 03/23/2020 FINAL  Final  Culture, Urine     Status: Abnormal   Collection Time: 03/18/20  5:59 PM   Specimen: Urine, Catheterized  Result Value Ref Range Status   Specimen Description URINE, CATHETERIZED  Final   Special Requests Normal  Final   Culture (A)  Final    50,000 COLONIES/mL GROUP B STREP(S.AGALACTIAE)ISOLATED TESTING AGAINST S. AGALACTIAE NOT ROUTINELY PERFORMED DUE TO PREDICTABILITY OF AMP/PEN/VAN SUSCEPTIBILITY. Performed at [facility], [address] [place], [place] [address]    Report Status 03/19/2020 FINAL  Final  MRSA PCR Screening     Status: Abnormal   Collection Time: 03/18/20  6:35 PM   Specimen: Nasal Mucosa; Nasopharyngeal  Result Value Ref Range Status   MRSA by PCR POSITIVE (A) NEGATIVE Final    Comment:        The GeneXpert MRSA Assay (FDA approved for NASAL specimens only), is one component of a comprehensive MRSA colonization surveillance program. It is not intended to diagnose MRSA infection nor to guide or monitor treatment for MRSA infections. RESULT CALLED TO, READ BACK BY AND VERIFIED WITH: STELLA,K RN  AT 2041 03/18/2020 MITCHELL,L Performed at [facility], [address] [place], [place] [address]   Culture, Urine     Status: Abnormal   Collection Time: 03/26/20  6:44 AM   Specimen: Urine, Random  Result Value Ref Range Status   Specimen Description URINE, RANDOM  Final   Special Requests   Final    NONE Performed at [facility], [address] [place], [place] [address]    Culture >=100,000 COLONIES/mL YEAST (A)  Final   Report Status 03/27/2020 FINAL  Final         Radiology  Studies: US RENAL  Result Date: 03/27/2020 CLINICAL DATA:  Hydronephrosis, diabetes mellitus, hypertension, history of renal failure EXAM: RENAL / URINARY TRACT ULTRASOUND COMPLETE COMPARISON:  03/25/2020 renal ultrasound and CT exams FINDINGS: Right Kidney: Renal measurements: 10.5 x 5.3 x 6.2 cm = volume: 181 mL. Cortical thinning. Normal cortical echogenicity. Severe hydronephrosis again identified. No mass or definite calcification identified. Left Kidney: Renal measurements: 9.6 x 4.5 x 4.2 cm = volume: 94 mL. Cortical thinning. Upper normal cortical echogenicity. No mass, hydronephrosis, or shadowing calcification. Bladder: Decompressed by Foley catheter, unable to evaluate. Other: N/A IMPRESSION: Persistent severe RIGHT hydronephrosis. BILATERAL renal cortical thinning. Electronically Signed   By: Ulyses SouthwardMark  Boles M.D.   On: 03/27/2020 08:17   CT CHEST ABDOMEN PELVIS WO CONTRAST  Result Date: 03/25/2020 CLINICAL DATA:  Weakness.  Acute kidney injury.  Hydronephrosis. EXAM: CT CHEST, ABDOMEN AND PELVIS WITHOUT CONTRAST TECHNIQUE: Multidetector CT imaging of the chest, abdomen and pelvis was performed following the standard protocol without IV contrast. COMPARISON:  January 19, 2020 FINDINGS: CT CHEST FINDINGS Cardiovascular: The heart size is unremarkable. There are coronary artery calcifications. There are atherosclerotic changes of the thoracic aorta without evidence for an aneurysm. There is a trace pericardial effusion. Mediastinum/Nodes: -- No mediastinal lymphadenopathy. -- No hilar lymphadenopathy. -- No axillary lymphadenopathy. -- No supraclavicular lymphadenopathy. -- Normal thyroid gland where visualized. -the enteric feeding tube terminates near the GE junction. Repositioning is recommended. Lungs/Pleura: There is scarring versus atelectasis at the lung bases. The trachea is unremarkable. There is no pneumothorax or large pleural effusion. Musculoskeletal: No chest wall abnormality. No bony  spinal canal stenosis. CT ABDOMEN PELVIS FINDINGS Hepatobiliary: The liver is normal. Status post cholecystectomy.There is no biliary ductal dilation. Pancreas: Normal contours without ductal dilatation. No peripancreatic fluid collection. Spleen: Unremarkable. Adrenals/Urinary Tract: --Adrenal glands: Unremarkable. --Right kidney/ureter: There is severe right-sided hydronephrosis which is new since January 19, 2020. There is a nonobstructing stone in the renal pelvis. The level of obstruction appears to be at the right UPJ. --Left kidney/ureter: There is mild left-sided hydroureteronephrosis without definite evidence for an obstructing stone. There is a punctate nonobstructing stone in the lower pole the left kidney. --Urinary bladder: The urinary bladder is significantly distended despite Foley catheter placement. Stomach/Bowel: --Stomach/Duodenum: No hiatal hernia or other gastric abnormality. Normal duodenal course and caliber. --Small bowel: Unremarkable. --Colon: Rectosigmoid diverticulosis without acute inflammation. --Appendix: Normal. Vascular/Lymphatic: Atherosclerotic calcification is present within the non-aneurysmal abdominal aorta, without hemodynamically significant stenosis. --No retroperitoneal lymphadenopathy. --No mesenteric lymphadenopathy. --No pelvic or inguinal lymphadenopathy. Reproductive: Unremarkable Other: There is a small volume of free fluid the patient's pelvis. The abdominal wall is normal. Musculoskeletal. Moderate degenerative changes are noted throughout the visualized thoracolumbar spine. There is diffuse osteopenia. No definite acute displaced fracture. IMPRESSION: 1. Severe right-sided hydronephrosis, new since January 19, 2020. 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  There is no obstructing stone visualized on this study. 2. Mild left-sided hydroureteronephrosis without definite evidence for an obstructing stone. 3. The urinary bladder is significantly  distended despite Foley catheter placement. Query catheter malfunction. 4. Enteric feeding tube terminates near the GE junction. Repositioning is recommended. 5. Rectosigmoid diverticulosis without acute inflammation. 6. Small volume of free fluid the patient's pelvis. Aortic Atherosclerosis (ICD10-I70.0). Electronically Signed   By: Katherine Mantle M.D.   On: 03/25/2020 15:53      Scheduled Meds: . amantadine  50 mg Per Tube Daily  . amLODipine  5 mg Per Tube Daily  . chlorhexidine  15 mL Mouth Rinse BID  . Chlorhexidine Gluconate Cloth  6 each Topical Daily  . clopidogrel  75 mg Per Tube Q breakfast  . feeding supplement (PROSource TF)  45 mL Per Tube TID  . free water  200 mL Per Tube Q4H  . [START ON 03/28/2020] heparin  5,000 Units Subcutaneous Q8H  . insulin aspart  0-9 Units Subcutaneous Q4H  . levothyroxine  112 mcg Per Tube QAC breakfast  . mouth rinse  15 mL Mouth Rinse q12n4p  . sodium chloride flush  3 mL Intravenous Once   Continuous Infusions: . sodium chloride 10 mL/hr at 03/24/20 0305  . sodium chloride    . cefTRIAXone (ROCEPHIN)  IV 1 g (03/27/20 1139)  . feeding supplement (OSMOLITE 1.2 CAL) 1,000 mL (03/26/20 1811)     LOS: 9 days      Calvert Cantor, MD [facility] Pager: [email or web address] 03/27/2020, 12:35 PM

## 2020-03-27 NOTE — Consult Note (Signed)
The patient's labs demonstrate a slight improvement in creatinine, but the remaining electrolytes appear to be worsening.  If the ultrasound confirms persistent hydronephrosis, based on the family discussion, I think the patient should have bilateral nephrostomy tubes placed.

## 2020-03-27 NOTE — Procedures (Signed)
Interventional Radiology Procedure Note  Procedure: Right percutaneous nephrostomy tube placement  Findings: Please refer to procedural dictation for full description. Right inferior pole, 10.2 Fr nephrostomy.  UPJ obstruction on limited nephrostogram.  Placed to bag drainage.  Complications: None immediate  Estimated Blood Loss: < 5 mL  Recommendations: Keep to bag drainage. IR will follow.  Marliss Coots, MD

## 2020-03-27 NOTE — Progress Notes (Signed)
Nephrology Follow-Up note   Assessment/Recommendations: Hannah Fischer is a/an 87 y.o. female with a past medical history significant for HTN, DM 2, CVA, hypothyroidism who present w/ stroke now complicated by AKI  AKI on CKD 3a: BL 1-1.2. Slowly progressive Crt rise now 2.1. Good UOP. Did receive contrast on 2/12 but unlikely having an effect this far out.    Continues to have worsening creatinine with CT scan concerning for severe hydronephrosis felt to be cause of AKI.  Also concern for possible infection based on urinalysis. -Distended bladder on CT scan despite Foley catheter being in place - note bladder decompressed by foley on renal US 2/21.  Renal US on 2/21 with severe persistent hydro  ------ - IR has been consulted for nephrostomy tube placement - appreciate assistance  - Appreciate urology assistance   - gentle IV fluids NS at 75/hr x 12 hours   Urinary tract infection.  Associated with obstruction as above.  On ceftriaxone. urine culture with group B strep. Intervention for obstruction as above  Hypertension: controlled  DM2: Management per primary team  Hypothyroidism: supplementation per primary   Hyperkalemia: Likely associated with obstruction and AKI.  Repeat lokelma on 2/21  Encephalopathy/CVA: Minimal interaction which is consistent with prior charting.  Management per primary team  ___________________________________________________________  CC: AKI on CKD  Interval History/Subjective: Patient with 2.8 liters UOP over 2/20.  She provides no additional history  Review of systems:  Unable to obtain 2/2 AMS  Medications:  Current Facility-Administered Medications  Medication Dose Route Frequency Provider Last Rate Last Admin  . 0.9 %  sodium chloride infusion  250 mL Intravenous Continuous Stretch, Marveen Reeks, MD 10 mL/hr at 03/24/20 0305 Restarted at 03/24/20 0305  . amantadine (SYMMETREL) 50 MG/5ML solution 50 mg  50 mg Per Tube Daily Bailey-Modzik,  Delila A, NP      . amLODipine (NORVASC) tablet 5 mg  5 mg Per Tube Daily Etter Sjogren, MD      . cefTRIAXone (ROCEPHIN) 1 g in sodium chloride 0.9 % 100 mL IVPB  1 g Intravenous Q24H Darnell Level, MD 200 mL/hr at 03/26/20 0946 1 g at 03/26/20 0946  . chlorhexidine (PERIDEX) 0.12 % solution 15 mL  15 mL Mouth Rinse BID Steffanie Dunn, DO   15 mL at 03/26/20 2108  . Chlorhexidine Gluconate Cloth 2 % PADS 6 each  6 each Topical Daily Steffanie Dunn, DO   6 each at 03/26/20 [phone]  . clopidogrel (PLAVIX) tablet 75 mg  75 mg Per Tube Q breakfast Leander Rams, RPH   75 mg at 03/26/20 7425  . docusate (COLACE) 50 MG/5ML liquid 100 mg  100 mg Per Tube BID PRN Kathlen Mody, MD      . feeding supplement (OSMOLITE 1.2 CAL) liquid 1,000 mL  1,000 mL Per Tube Continuous Olalere, Adewale A, MD 45 mL/hr at 03/26/20 1811 1,000 mL at 03/26/20 1811  . feeding supplement (PROSource TF) liquid 45 mL  45 mL Per Tube TID Olalere, Adewale A, MD   45 mL at 03/26/20 2109  . free water 200 mL  200 mL Per Tube Q4H Calvert Cantor, MD   200 mL at 03/27/20 0031  . [START ON 03/28/2020] heparin injection 5,000 Units  5,000 Units Subcutaneous Q8H Ralene Muskrat, PA-C      . insulin aspart (novoLOG) injection 0-9 Units  0-9 Units Subcutaneous Q4H Selmer Dominion B, NP   1 Units at 03/27/20 0419  . levothyroxine (SYNTHROID)  tablet 112 mcg  112 mcg Per Tube QAC breakfast Kathlen Mody, MD   112 mcg at 03/26/20 7322  . MEDLINE mouth rinse  15 mL Mouth Rinse q12n4p Karie Fetch P, DO   15 mL at 03/26/20 1752  . polyethylene glycol (MIRALAX / GLYCOLAX) packet 17 g  17 g Per Tube Daily PRN Kathlen Mody, MD      . sodium chloride flush (NS) 0.9 % injection 3 mL  3 mL Intravenous Once Milagros Loll, MD         Physical Exam: Vitals:   03/27/20 0700 03/27/20 0809  BP: 123/90 134/70  Pulse: 90 78  Resp: 18 18  Temp: 97.8 F (36.6 C) 98.5 F (36.9 C)  SpO2: 100% 100%   No intake/output data recorded.  Intake/Output  Summary (Last 24 hours) at 03/27/2020 1028 Last data filed at 03/27/2020 0415 Gross per 24 hour  Intake 225 ml  Output 2800 ml  Net -2575 ml   General elderly female in bed in no acute distress HEENT normocephalic atraumatic extraocular movements intact sclera anicteric Neck supple trachea midline Lungs clear to auscultation bilaterally normal work of breathing at rest ; on 2 liters Heart S1S2 no rub Abdomen soft nontender nondistended Extremities no edema  Neuro - does not follow commands or open eyes, minimally interactive   Test Results I personally reviewed new and old clinical labs and radiology tests Lab Results  Component Value Date   NA 138 03/27/2020   K 5.2 (H) 03/27/2020   CL 100 03/27/2020   CO2 26 03/27/2020   BUN 61 (H) 03/27/2020   CREATININE 2.52 (H) 03/27/2020   GFR 70.52 10/15/2013   CALCIUM 9.2 03/27/2020   ALBUMIN 2.5 (L) 03/27/2020   PHOS 6.8 (H) 03/27/2020    Estanislado Emms, MD 03/27/2020 10:43 AM

## 2020-03-27 NOTE — Progress Notes (Signed)
Nutrition Follow-up  DOCUMENTATION CODES:   Not applicable  INTERVENTION:   Continue TF via Cortrak tube:  Osmolite 1.2 at 45 ml/h  Prosource TF 45 ml TID  Free water flushes 200 ml every 4 hours  Provides 1416 kcal, 93 gm protein, 2075 ml free water total daily  NUTRITION DIAGNOSIS:   Inadequate oral intake related to inability to eat as evidenced by NPO status.  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  2/19 ultrasound revealed hydronephrosis, R worse that L and chronic R UPJ obstruction. Plans for R percutaneous nephrostomy drain placement in IR.  Patient remains NPO. Cortrak in place. Receiving Osmolite 1.2 at 45 ml/h with Prosource TF 45 ml TID providing 1416 kcal, 93 gm protein, 875 ml free water daily. Free water flushes 200 ml every 4 hours. Tolerating well.  Palliative Care team following. Family is hoping patient will improve. Husband does not want ventilator, dialysis, or long term feeding tube.  Code status: DNR  Labs reviewed. K 5.2, Phos 6.8 CBG: [phone]  Medications reviewed and include Novolog.  Diet Order:   Diet Order            Diet NPO time specified  Diet effective midnight                 EDUCATION NEEDS:   No education needs have been identified at this time  Skin:  Skin Assessment: Reviewed RN Assessment  Last BM:  2/21 type 7  Height:   Ht Readings from Last 1 Encounters:  03/18/20 5' 3"  (1.6 m)    Weight:   Wt Readings from Last 1 Encounters:  03/25/20 104.6 kg    Ideal Body Weight:  52.2 kg  BMI:   Body mass index is 40.85 kg/m.  Estimated Nutritional Needs:   Kcal:  1400  Protein:  85-100 grams  Fluid:  > 1.5 L/day    Lucas Mallow, RD, LDN, CNSC Please refer to Amion for contact information.

## 2020-03-27 NOTE — Progress Notes (Signed)
Physical Therapy Treatment Patient Details Name: Hannah Fischer MRN: [id number] DOB: July 28, 1932 Today's Date: 03/27/2020    History of Present Illness 87 y.o. female presenting code stroke. Imaging (+) for bilateral medial thalamic and punctate infarcts and L superior cerebellar infarcts. PMHx significant for multiple CVAs with most recent acute/subacute R frontal lobe and L parietal and occipital lobe infarcts ~3 months ago, residual L-sided hemiplegia with recent d/c from outpatient PT/OT with return to baseline level of function, TIA, DM, PE, anxiety, HTN, and HLD.    PT Comments    Pt tolerates treatment well despite initially being very lethargic. Pt is able to transfer multiple times this session with significant assistance from PT due to LE weakness. Pt is unable to ambulate due to LE strength deficits, with bilateral knee buckling with attempts at stepping at the edge of bed. Pt will benefit from continue acute PT services and mobilization to improve arousal, balance, and strength. PT continues to recommend SNF placement at this time.   Follow Up Recommendations  SNF;Supervision/Assistance - 24 hour     Equipment Recommendations  Wheelchair cushion (measurements PT);Wheelchair (measurements PT);Hospital bed    Recommendations for Other Services       Precautions / Restrictions Precautions Precautions: Fall Precaution Comments: cortrak, bilateral wrist restraints and L mitten Restrictions Weight Bearing Restrictions: No    Mobility  Bed Mobility Overal bed mobility: Needs Assistance Bed Mobility: Supine to Sit;Sit to Supine     Supine to sit: Max assist;HOB elevated Sit to supine: Total assist        Transfers Overall transfer level: Needs assistance Equipment used: 1 person hand held assist Transfers: Sit to/from Stand Sit to Stand: Mod assist         General transfer comment: pt performs 4 sit to stands, requiring initiation of  PT  Ambulation/Gait Ambulation/Gait assistance: Total assist Gait Distance (Feet): 0 Feet Assistive device: None       General Gait Details: attempt to take step forward but knees buckle when in SLS   Stairs             Wheelchair Mobility    Modified Rankin (Stroke Patients Only) Modified Rankin (Stroke Patients Only) Pre-Morbid Rankin Score: No significant disability Modified Rankin: Severe disability     Balance Overall balance assessment: Needs assistance Sitting-balance support: Single extremity supported;Bilateral upper extremity supported;Feet supported Sitting balance-Leahy Scale: Poor Sitting balance - Comments: mod-maxA due to posterior or R lateral lean Postural control: Right lateral lean;Posterior lean Standing balance support: Bilateral upper extremity supported Standing balance-Leahy Scale: Poor Standing balance comment: mod-maxA                            Cognition Arousal/Alertness: Lethargic (does awaken with mobility and stimulation) Behavior During Therapy: Flat affect Overall Cognitive Status: Impaired/Different from baseline Area of Impairment: Attention;Memory;Following commands;Safety/judgement;Awareness;Problem solving                   Current Attention Level: Focused Memory: Decreased short-term memory Following Commands: Follows one step commands inconsistently;Follows one step commands with increased time Safety/Judgement: Decreased awareness of safety;Decreased awareness of deficits Awareness: Intellectual Problem Solving: Slow processing        Exercises      General Comments General comments (skin integrity, edema, etc.): VSS on RA      Pertinent Vitals/Pain Pain Assessment: Faces Faces Pain Scale: No hurt    Home Living  Prior Function            PT Goals (current goals can now be found in the care plan section) Acute Rehab PT Goals Patient Stated Goal: Unable to  state. Progress towards PT goals: Progressing toward goals (very slowly)    Frequency    Min 3X/week      PT Plan Current plan remains appropriate    Co-evaluation              AM-PAC PT "6 Clicks" Mobility   Outcome Measure  Help needed turning from your back to your side while in a flat bed without using bedrails?: A Lot Help needed moving from lying on your back to sitting on the side of a flat bed without using bedrails?: A Lot Help needed moving to and from a bed to a chair (including a wheelchair)?: A Lot Help needed standing up from a chair using your arms (e.g., wheelchair or bedside chair)?: A Lot Help needed to walk in hospital room?: Total Help needed climbing 3-5 steps with a railing? : Total 6 Click Score: 10    End of Session   Activity Tolerance: Patient tolerated treatment well Patient left: in bed;with call bell/phone within reach;with bed alarm set;with restraints reapplied Nurse Communication: Mobility status;Need for lift equipment PT Visit Diagnosis: Other symptoms and signs involving the nervous system (R29.898);Difficulty in walking, not elsewhere classified (R26.2);Unsteadiness on feet (R26.81);Muscle weakness (generalized) (M62.81)     Time: 6047-9987 PT Time Calculation (min) (ACUTE ONLY): 34 min  Charges:  $Therapeutic Activity: 23-37 mins                     Arlyss Gandy, PT, DPT Acute Rehabilitation Pager: [phone]    Arlyss Gandy 03/27/2020, 9:30 AM

## 2020-03-27 NOTE — Consult Note (Signed)
Chief Complaint: Patient was seen in consultation today for right percutaneous nephrostomy drain placement Chief Complaint  Patient presents with  . Code Stroke   at the request of Dr Marlou Porch   Supervising Physician: Marliss Coots  Patient Status: [facility] - In-pt  History of Present Illness: Hannah Fischer is a 87 y.o. female   HTN; HLDS; DM; CVAs Hypothyroid  Admitted acute encephalopathy - CVA  CKD 3/AKI- dehydration 2/19: US revealing Right worse than left hydronephrosis Chronic Rt UPJ obstruction  Urology note Dr Marlou Porch 2/20:  I reviewed the patient's CT scan and discussed it with both interventional radiology and nephrology.  On the CT scan she has mild left-sided hydronephrosis and moderate right-sided hydronephrosis.  Her bladder is also very distended on the CT scan despite the Foley catheter.  I am not sure why she has bilateral hydronephrosis now, as there is no evidence of obstruction on her CT scan.  Perhaps her Foley catheter is not been draining well and a lot of the urine is backed up creating the dilation of the collecting system.  It may just appear worse on the right because of her extrarenal pelvis on that side.  Alternatively she may have developed obstruction of for unclear reasons.  Either way her kidney function has not improved.  There does not appear to be any medical reason for her to otherwise be developing worsening renal function.  [place] today: IMPRESSION: Persistent severe RIGHT hydronephrosis. BILATERAL renal cortical thinning.  Cr now 2.52  Request for right percutaneous nephrostomy drain per Urology Dr Elby Showers has reviewed imaging and approves procedure   Past Medical History:  Diagnosis Date  . Abnormal nuclear stress test    2005, normal cath Dr. Elease Hashimoto, normal stress test 2012 Dr. Eldridge Dace  . Anemia    chronic  . Anxiety   . Arthritis    djd  . Arthritis of shoulder region, left, degenerative 12/03/2013  . Blood transfusion 1979    for anemia  . Chest pain    evaluated @ [facility] cardiology; had a stress test 2012  . Chronic pain syndrome 04/29/2019  . Chronic right-sided low back pain without sciatica 04/12/2019  . Complication of anesthesia    unable to breathe lying  flat on back  . Diabetes mellitus    type 2  niddm x 25 yrs  . Diabetes mellitus (HCC) 11/15/2011  . Diverticular disease   . DJD (degenerative joint disease)    lumbar and cervical  . GERD (gastroesophageal reflux disease)   . Hyperlipemia   . Hypertension    on medication  . Hypothyroidism    takes levoxyl  . Irritable bowel syndrome   . 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 Neuromuscular disorder (HCC)   . Neuropathy in diabetes (HCC)   . Nonspecific abnormal electrocardiogram (ECG) (EKG) 10/15/2013  . Other and unspecified angina pectoris    tests came back NOT heart related  . Pulmonary embolism (HCC) 06-2010   bilateral  . Pyuria 11/15/2011  . Seizures (HCC)    once, age 44  . TIA (transient ischemic attack) 11/15/2011    Past Surgical History:  Procedure Laterality Date  . BACK SURGERY    . BLEPHAROPLASTY  2012   rt eye  . CARDIAC CATHETERIZATION     had one 10/2013  . CHOLECYSTECTOMY    . EYE SURGERY  2007   cataract ext/ iol rt eye  . JOINT REPLACEMENT  2001,2005   both  knees  . LEFT HEART CATHETERIZATION WITH CORONARY ANGIOGRAM N/A 10/18/2013   Procedure: LEFT HEART CATHETERIZATION WITH CORONARY ANGIOGRAM;  Surgeon: Corky Crafts, MD;  Location: [facility] CATH LAB;  Service: Cardiovascular;  Laterality: N/A;  . LUMBAR DISC SURGERY  1990  . REVERSE SHOULDER ARTHROPLASTY Left 12/03/2013   Procedure: LEFT SHOULDER REVERSE ARTHROPLASTY;  Surgeon: Verlee Rossetti, MD;  Location: [facility] OR;  Service: Orthopedics;  Laterality: Left;  . SHOULDER HEMI-ARTHROPLASTY  12/25/2010   Procedure: SHOULDER HEMI-ARTHROPLASTY;  Surgeon: Cammy Copa;  Location: [facility] OR;  Service: Orthopedics;  Laterality: Right;     Allergies: Atorvastatin, Ezetimibe-simvastatin, Lovastatin, Metformin hcl, Pravastatin, Codeine, Simvastatin, Sulfa antibiotics, and Sulfites  Medications: Prior to Admission medications   Medication Sig Start Date End Date Taking? Authorizing Provider  amLODipine (NORVASC) 5 MG tablet Take 5 mg by mouth every morning. 12/22/19  Yes [provider]  B Complex Vitamins (VITAMIN B COMPLEX) TABS Take 1 tablet by mouth daily after breakfast.   Yes [provider]  Cholecalciferol (VITAMIN D3) 2000 UNITS TABS Take 2,000 Units by mouth daily.    Yes [provider]  clopidogrel (PLAVIX) 75 MG tablet Take 1 tablet (75 mg total) by mouth daily with breakfast. 11/17/11  Yes Vassie Loll, MD  DULoxetine (CYMBALTA) 30 MG capsule TAKE 1 CAPSULE(30 MG) BY MOUTH DAILY Patient taking differently: Take 30 mg by mouth daily. 08/25/19  Yes Marcello Fennel, MD  levothyroxine (SYNTHROID) 112 MCG tablet Take 112 mcg by mouth daily before breakfast.   Yes [provider]  losartan (COZAAR) 100 MG tablet Take 100 mg by mouth daily. 04/29/19  Yes [provider]  nitrofurantoin (MACRODANTIN) 100 MG capsule Take 100 mg by mouth daily. 03/09/20  Yes [provider]  Omega-3 Fatty Acids (FISH OIL) 1000 MG CAPS Take 1,000 mg by mouth 2 (two) times daily.   Yes [provider]  pioglitazone (ACTOS) 45 MG tablet Take 45 mg by mouth daily.   Yes [provider]  propranolol (INDERAL) 40 MG tablet Take 40 mg by mouth daily.  04/13/17  Yes [provider]  traZODone (DESYREL) 50 MG tablet TAKE 1 TABLET BY MOUTH AT BEDTIME Patient taking differently: Take 50 mg by mouth at bedtime. 07/01/17  Yes Patel, Maryln Gottron, MD  levothyroxine (SYNTHROID, LEVOTHROID) 100 MCG tablet Take 1 tablet (100 mcg total) by mouth daily. Patient not taking: No sig reported 11/17/11   Vassie Loll, MD  methocarbamol (ROBAXIN) 500 MG tablet Take 1 tablet (500 mg  total) by mouth every 8 (eight) hours as needed for muscle spasms. Patient not taking: No sig reported 04/13/19   Marcello Fennel, MD  rosuvastatin (CRESTOR) 5 MG tablet Take 1 tablet (5 mg total) by mouth 2 (two) times a week for 30 doses. 11/29/19 03/10/20  Azucena Fallen, MD     Family History  Problem Relation Age of Onset  . Diabetes Mother   . Heart disease Mother   . Heart disease Sister   . Heart disease Brother     Social History   Socioeconomic History  . Marital status: Married    Spouse name: Not on file  . Number of children: Not on file  . Years of education: Not on file  . Highest education level: Not on file  Occupational History  . Not on file  Tobacco Use  . Smoking status: Never Smoker  . Smokeless tobacco: Never Used  Substance and Sexual Activity  . Alcohol  use: No  . Drug use: No  . Sexual activity: Never    Birth control/protection: Post-menopausal  Other Topics Concern  . Not on file  Social History Narrative  . Not on file   Social Determinants of Health   Financial Resource Strain: Not on file  Food Insecurity: Not on file  Transportation Needs: Not on file  Physical Activity: Not on file  Stress: Not on file  Social Connections: Not on file    Review of Systems: A 12 point ROS discussed and pertinent positives are indicated in the HPI above.  All other systems are negative.  Review of Systems  Vital Signs: BP 134/70 (BP Location: Right Arm)   Pulse 78   Temp 98.5 F (36.9 C) (Axillary)   Resp 18   Ht 5\' 3"  (1.6 m)   Wt 230 lb 9.6 oz (104.6 kg)   SpO2 100%   BMI 40.85 kg/m   Physical Exam Vitals reviewed.  HENT:     Mouth/Throat:     Mouth: Mucous membranes are moist.  Cardiovascular:     Rate and Rhythm: Normal rate and regular rhythm.     Heart sounds: Normal heart sounds.  Pulmonary:     Breath sounds: Normal breath sounds.  Abdominal:     Palpations: Abdomen is soft.  Skin:    General: Skin is warm.   Neurological:     Comments: Sleepy; groggy does not follow all commands Can open eyes and mouth  Psychiatric:     Comments: Spoke to husband via phone He agrees to procedure Consent in IR     Imaging: CT Code Stroke CTA Head W/WO contrast  Addendum Date: 03/18/2020   ADDENDUM REPORT: 03/18/2020 15:27 ADDENDUM: These results were called by telephone at the time of interpretation on 03/18/2020 at 3:15 pm to provider PRAMOD SETHI , who verbally acknowledged these results. Electronically Signed   By: Jackey Loge DO   On: 03/18/2020 15:27   Result Date: 03/18/2020 CLINICAL DATA:  Stroke, follow-up. EXAM: CT ANGIOGRAPHY HEAD AND NECK CT PERFUSION BRAIN TECHNIQUE: Multidetector CT imaging of the head and neck was performed using the standard protocol during bolus administration of intravenous contrast. Multiplanar CT image reconstructions and MIPs were obtained to evaluate the vascular anatomy. Carotid stenosis measurements (when applicable) are obtained utilizing NASCET criteria, using the distal internal carotid diameter as the denominator. Multiphase CT imaging of the brain was performed following IV bolus contrast injection. Subsequent parametric perfusion maps were calculated using RAPID software. CONTRAST:  90mL OMNIPAQUE IOHEXOL 350 MG/ML SOLN COMPARISON:  Noncontrast head CT performed earlier today 03/18/2020. MRI/MRA head and MRA neck 11/24/2019. FINDINGS: CTA NECK FINDINGS Significantly limited evaluation due to poor contrast bolus timing and motion degradation. Aortic arch: Atherosclerotic plaque within the visualized aortic arch and proximal major branch vessels of the neck. Within described limitations, no definite hemodynamically significant stenosis of the innominate or proximal subclavian arteries is identified. Right carotid system: CCA and ICA patent within the neck. Moderate to severe calcified plaque within the right carotid bifurcation and proximal ICA. A hemodynamically significant  stenosis at this site cannot be excluded due to significant motion degradation at this level. Left carotid system: CCA and ICA patent within the neck. Moderate calcified plaque within the carotid bifurcation and proximal ICA. Apparent stenosis at the origin of the left ICA of up to 60%. Vertebral arteries: Streak and beam hardening artifact precludes evaluation of the proximal right vertebral artery. Within this limitation, the right vertebral artery  appears patent within the neck. However, there is gradually diminishing enhancement within the mid to distal cervical right vertebral artery and the right vertebral artery appears highly stenosed or occluded intracranially. Streak and beam hardening artifact precludes adequate evaluation of the proximal left vertebral artery. Within this limitation, the left vertebral artery is patent within the neck. Skeleton: No acute bony abnormality or aggressive osseous lesion. Cervical spondylosis Other neck: No neck mass or cervical lymphadenopathy. Upper chest: No consolidation within the imaged lung apices. Review of the MIP images confirms the above findings CTA HEAD FINDINGS Significantly limited evaluation due to suboptimal contrast bolus timing. Anterior circulation: The intracranial internal carotid arteries are patent. Prominent calcified plaque within both vessels with suspected moderate stenosis bilaterally. The M1 middle cerebral arteries are patent. No M2 proximal branch occlusion is identified. Moderate/severe stenosis within a mid M2 left MCA branch vessel (series 13, image 27). As before, the A1 left ACA is poorly delineated, this may be secondary to developmentally small vessel size or high-grade stenosis. The anterior cerebral arteries are otherwise patent. No intracranial aneurysm is identified. Posterior circulation: Paucity of enhancement of the intracranial right vertebral artery. This vessel may be highly stenosed or occluded. The intracranial left vertebral  artery is patent. Mild atherosclerotic plaque within this vessel without stenosis. The basilar artery is patent. The posterior cerebral arteries are patent proximally without significant stenosis. Posterior communicating arteries are hypoplastic or absent bilaterally. Venous sinuses: Poorly assessed due to contrast timing. Anatomic variants: As described Review of the MIP images confirms the above findings CT Brain Perfusion Findings: ASPECTS: 10 CBF (<30%) Volume: 0mL Perfusion (Tmax>6.0s) volume: 0mL Mismatch Volume: 0mL Infarction Location:None identified These results were called by telephone at the time of interpretation on 03/18/2020 at 3:07 pm to provider PRAMOD SETHI , who verbally acknowledged these results. IMPRESSION: CTA neck: 1. Significantly limited evaluation due to motion degradation and suboptimal contrast timing. 2. The common and internal carotid arteries are patent within the neck. Moderate/severe calcified plaque within the right carotid bifurcation and proximal ICA. Quantification of stenosis at this site is not possible due to the degree of motion degradation. Calcified plaque within the left carotid bifurcation and proximal ICA with estimated up to 60% stenosis of the proximal left ICA. 3. Streak and beam hardening artifact precludes adequate evaluation of the proximal vertebral arteries. Within this limitation, the vertebral arteries are patent within the neck. However, there is gradually diminishing enhancement within the mid to distal cervical right vertebral artery and the intracranial right vertebral artery now appears highly stenosed or occluded (however, this could be artifactual in the setting of suboptimal contrast timing). CTA head: 1. Limited evaluation due to suboptimal contrast timing. 2. Paucity of enhancement within the non dominant intracranial right vertebral artery. This vessel may now be highly stenosed or occluded (however, this could be artifactual in the setting of  suboptimal contrast timing). 3. Moderate/severe stenosis within a mid M2 left MCA branch vessel. 4. Elsewhere, no intracranial large vessel occlusion or proximal high-grade arterial stenosis is identified. 5. Prominent calcified plaque within the intracranial ICAs bilaterally with suspected moderate bilateral stenosis. CT perfusion head: The perfusion software identifies no core infarct. The perfusion software identifies no critically hypoperfused parenchyma utilizing the Tmax>6 seconds threshold. Electronically Signed: By: Jackey Loge DO On: 03/18/2020 15:08   CT Code Stroke CTA Neck W/WO contrast  Addendum Date: 03/18/2020   ADDENDUM REPORT: 03/18/2020 15:27 ADDENDUM: These results were called by telephone at the time of interpretation on 03/18/2020  at 3:15 pm to provider PRAMOD SETHI , who verbally acknowledged these results. Electronically Signed   By: Jackey Loge DO   On: 03/18/2020 15:27   Result Date: 03/18/2020 CLINICAL DATA:  Stroke, follow-up. EXAM: CT ANGIOGRAPHY HEAD AND NECK CT PERFUSION BRAIN TECHNIQUE: Multidetector CT imaging of the head and neck was performed using the standard protocol during bolus administration of intravenous contrast. Multiplanar CT image reconstructions and MIPs were obtained to evaluate the vascular anatomy. Carotid stenosis measurements (when applicable) are obtained utilizing NASCET criteria, using the distal internal carotid diameter as the denominator. Multiphase CT imaging of the brain was performed following IV bolus contrast injection. Subsequent parametric perfusion maps were calculated using RAPID software. CONTRAST:  90mL OMNIPAQUE IOHEXOL 350 MG/ML SOLN COMPARISON:  Noncontrast head CT performed earlier today 03/18/2020. MRI/MRA head and MRA neck 11/24/2019. FINDINGS: CTA NECK FINDINGS Significantly limited evaluation due to poor contrast bolus timing and motion degradation. Aortic arch: Atherosclerotic plaque within the visualized aortic arch and proximal  major branch vessels of the neck. Within described limitations, no definite hemodynamically significant stenosis of the innominate or proximal subclavian arteries is identified. Right carotid system: CCA and ICA patent within the neck. Moderate to severe calcified plaque within the right carotid bifurcation and proximal ICA. A hemodynamically significant stenosis at this site cannot be excluded due to significant motion degradation at this level. Left carotid system: CCA and ICA patent within the neck. Moderate calcified plaque within the carotid bifurcation and proximal ICA. Apparent stenosis at the origin of the left ICA of up to 60%. Vertebral arteries: Streak and beam hardening artifact precludes evaluation of the proximal right vertebral artery. Within this limitation, the right vertebral artery appears patent within the neck. However, there is gradually diminishing enhancement within the mid to distal cervical right vertebral artery and the right vertebral artery appears highly stenosed or occluded intracranially. Streak and beam hardening artifact precludes adequate evaluation of the proximal left vertebral artery. Within this limitation, the left vertebral artery is patent within the neck. Skeleton: No acute bony abnormality or aggressive osseous lesion. Cervical spondylosis Other neck: No neck mass or cervical lymphadenopathy. Upper chest: No consolidation within the imaged lung apices. Review of the MIP images confirms the above findings CTA HEAD FINDINGS Significantly limited evaluation due to suboptimal contrast bolus timing. Anterior circulation: The intracranial internal carotid arteries are patent. Prominent calcified plaque within both vessels with suspected moderate stenosis bilaterally. The M1 middle cerebral arteries are patent. No M2 proximal branch occlusion is identified. Moderate/severe stenosis within a mid M2 left MCA branch vessel (series 13, image 27). As before, the A1 left ACA is poorly  delineated, this may be secondary to developmentally small vessel size or high-grade stenosis. The anterior cerebral arteries are otherwise patent. No intracranial aneurysm is identified. Posterior circulation: Paucity of enhancement of the intracranial right vertebral artery. This vessel may be highly stenosed or occluded. The intracranial left vertebral artery is patent. Mild atherosclerotic plaque within this vessel without stenosis. The basilar artery is patent. The posterior cerebral arteries are patent proximally without significant stenosis. Posterior communicating arteries are hypoplastic or absent bilaterally. Venous sinuses: Poorly assessed due to contrast timing. Anatomic variants: As described Review of the MIP images confirms the above findings CT Brain Perfusion Findings: ASPECTS: 10 CBF (<30%) Volume: 0mL Perfusion (Tmax>6.0s) volume: 0mL Mismatch Volume: 0mL Infarction Location:None identified These results were called by telephone at the time of interpretation on 03/18/2020 at 3:07 pm to provider PRAMOD SETHI , who verbally  acknowledged these results. IMPRESSION: CTA neck: 1. Significantly limited evaluation due to motion degradation and suboptimal contrast timing. 2. The common and internal carotid arteries are patent within the neck. Moderate/severe calcified plaque within the right carotid bifurcation and proximal ICA. Quantification of stenosis at this site is not possible due to the degree of motion degradation. Calcified plaque within the left carotid bifurcation and proximal ICA with estimated up to 60% stenosis of the proximal left ICA. 3. Streak and beam hardening artifact precludes adequate evaluation of the proximal vertebral arteries. Within this limitation, the vertebral arteries are patent within the neck. However, there is gradually diminishing enhancement within the mid to distal cervical right vertebral artery and the intracranial right vertebral artery now appears highly stenosed or  occluded (however, this could be artifactual in the setting of suboptimal contrast timing). CTA head: 1. Limited evaluation due to suboptimal contrast timing. 2. Paucity of enhancement within the non dominant intracranial right vertebral artery. This vessel may now be highly stenosed or occluded (however, this could be artifactual in the setting of suboptimal contrast timing). 3. Moderate/severe stenosis within a mid M2 left MCA branch vessel. 4. Elsewhere, no intracranial large vessel occlusion or proximal high-grade arterial stenosis is identified. 5. Prominent calcified plaque within the intracranial ICAs bilaterally with suspected moderate bilateral stenosis. CT perfusion head: The perfusion software identifies no core infarct. The perfusion software identifies no critically hypoperfused parenchyma utilizing the Tmax>6 seconds threshold. Electronically Signed: By: Jackey LogeKyle  Golden DO On: 03/18/2020 15:08   MR BRAIN WO CONTRAST  Result Date: 03/19/2020 CLINICAL DATA:  Suspected stroke. EXAM: MRI HEAD WITHOUT CONTRAST TECHNIQUE: Multiplanar, multiecho pulse sequences of the brain and surrounding structures were obtained without intravenous contrast. COMPARISON:  CT studies done yesterday. FINDINGS: Brain: Diffusion imaging shows a punctate acute infarction in the left superior cerebellum adjacent to the superior cerebellar peduncle. There is bilateral thalamic acute infarction. This extends back into the mid brain in a symmetric fashion. No other acute infarction. This is consistent with artery Percheron infarction. Elsewhere, mega cisterna magna is noted. There chronic small-vessel ischemic changes of the hemispheric white matter. There is old infarction at the left parietooccipital junction. No evidence of hemorrhage, hydrocephalus or extra-axial collection. Vascular: Major vessels at the base of the brain show flow. Skull and upper cervical spine: Negative Sinuses/Orbits: Clear/normal Other: None IMPRESSION: 1.  Punctate acute infarction in the left superior cerebellum adjacent to the superior cerebellar peduncle. Bilateral thalamic acute infarction extending back into the mid brain. This is consistent with artery of Percheron infarction. No hemorrhage or mass effect. 2. Old infarction at the left parietooccipital junction. 3. Chronic small-vessel ischemic changes elsewhere throughout the brain. Electronically Signed   By: Paulina FusiMark  Shogry M.D.   On: 03/19/2020 02:43   US RENAL  Result Date: 03/27/2020 CLINICAL DATA:  Hydronephrosis, diabetes mellitus, hypertension, history of renal failure EXAM: RENAL / URINARY TRACT ULTRASOUND COMPLETE COMPARISON:  03/25/2020 renal ultrasound and CT exams FINDINGS: Right Kidney: Renal measurements: 10.5 x 5.3 x 6.2 cm = volume: 181 mL. Cortical thinning. Normal cortical echogenicity. Severe hydronephrosis again identified. No mass or definite calcification identified. Left Kidney: Renal measurements: 9.6 x 4.5 x 4.2 cm = volume: 94 mL. Cortical thinning. Upper normal cortical echogenicity. No mass, hydronephrosis, or shadowing calcification. Bladder: Decompressed by Foley catheter, unable to evaluate. Other: N/A IMPRESSION: Persistent severe RIGHT hydronephrosis. BILATERAL renal cortical thinning. Electronically Signed   By: Ulyses SouthwardMark  Boles M.D.   On: 03/27/2020 08:17   US RENAL  Result Date: 03/25/2020 CLINICAL DATA:  87 year old female with renal failure. EXAM: RENAL / URINARY TRACT ULTRASOUND COMPLETE COMPARISON:  04/02/2019 CT FINDINGS: Right Kidney: Renal measurements: 9.9 x 5.7 x 5.2 cm = volume: 150 mL. Moderate to severe RIGHT hydronephrosis is identified. A 1.2 cm calculus in the region of the renal pelvis/possible UPJ noted. Renal cortical thinning is noted. Left Kidney: Not visualized Bladder: Ureteral jets are not noted.  Otherwise appears normal. Other: None. IMPRESSION: Moderate to severe RIGHT hydronephrosis with a 1.2 cm calculus in the region of the renal pelvis/possible  UPJ. Consider further evaluation with CT to evaluate site of obstruction. LEFT kidney not visualized. These results will be called to the ordering clinician or representative by the Radiologist Assistant, and communication documented in the PACS or [employer]. Electronically Signed   By: Harmon Pier M.D.   On: 03/25/2020 11:45   CT Code Stroke Cerebral Perfusion with contrast  Addendum Date: 03/18/2020   ADDENDUM REPORT: 03/18/2020 15:27 ADDENDUM: These results were called by telephone at the time of interpretation on 03/18/2020 at 3:15 pm to provider PRAMOD SETHI , who verbally acknowledged these results. Electronically Signed   By: Jackey Loge DO   On: 03/18/2020 15:27   Result Date: 03/18/2020 CLINICAL DATA:  Stroke, follow-up. EXAM: CT ANGIOGRAPHY HEAD AND NECK CT PERFUSION BRAIN TECHNIQUE: Multidetector CT imaging of the head and neck was performed using the standard protocol during bolus administration of intravenous contrast. Multiplanar CT image reconstructions and MIPs were obtained to evaluate the vascular anatomy. Carotid stenosis measurements (when applicable) are obtained utilizing NASCET criteria, using the distal internal carotid diameter as the denominator. Multiphase CT imaging of the brain was performed following IV bolus contrast injection. Subsequent parametric perfusion maps were calculated using RAPID software. CONTRAST:  90mL OMNIPAQUE IOHEXOL 350 MG/ML SOLN COMPARISON:  Noncontrast head CT performed earlier today 03/18/2020. MRI/MRA head and MRA neck 11/24/2019. FINDINGS: CTA NECK FINDINGS Significantly limited evaluation due to poor contrast bolus timing and motion degradation. Aortic arch: Atherosclerotic plaque within the visualized aortic arch and proximal major branch vessels of the neck. Within described limitations, no definite hemodynamically significant stenosis of the innominate or proximal subclavian arteries is identified. Right carotid system: CCA and ICA patent  within the neck. Moderate to severe calcified plaque within the right carotid bifurcation and proximal ICA. A hemodynamically significant stenosis at this site cannot be excluded due to significant motion degradation at this level. Left carotid system: CCA and ICA patent within the neck. Moderate calcified plaque within the carotid bifurcation and proximal ICA. Apparent stenosis at the origin of the left ICA of up to 60%. Vertebral arteries: Streak and beam hardening artifact precludes evaluation of the proximal right vertebral artery. Within this limitation, the right vertebral artery appears patent within the neck. However, there is gradually diminishing enhancement within the mid to distal cervical right vertebral artery and the right vertebral artery appears highly stenosed or occluded intracranially. Streak and beam hardening artifact precludes adequate evaluation of the proximal left vertebral artery. Within this limitation, the left vertebral artery is patent within the neck. Skeleton: No acute bony abnormality or aggressive osseous lesion. Cervical spondylosis Other neck: No neck mass or cervical lymphadenopathy. Upper chest: No consolidation within the imaged lung apices. Review of the MIP images confirms the above findings CTA HEAD FINDINGS Significantly limited evaluation due to suboptimal contrast bolus timing. Anterior circulation: The intracranial internal carotid arteries are patent. Prominent calcified plaque within both vessels with suspected moderate stenosis bilaterally.  The M1 middle cerebral arteries are patent. No M2 proximal branch occlusion is identified. Moderate/severe stenosis within a mid M2 left MCA branch vessel (series 13, image 27). As before, the A1 left ACA is poorly delineated, this may be secondary to developmentally small vessel size or high-grade stenosis. The anterior cerebral arteries are otherwise patent. No intracranial aneurysm is identified. Posterior circulation: Paucity  of enhancement of the intracranial right vertebral artery. This vessel may be highly stenosed or occluded. The intracranial left vertebral artery is patent. Mild atherosclerotic plaque within this vessel without stenosis. The basilar artery is patent. The posterior cerebral arteries are patent proximally without significant stenosis. Posterior communicating arteries are hypoplastic or absent bilaterally. Venous sinuses: Poorly assessed due to contrast timing. Anatomic variants: As described Review of the MIP images confirms the above findings CT Brain Perfusion Findings: ASPECTS: 10 CBF (<30%) Volume: 23mL Perfusion (Tmax>6.0s) volume: 42mL Mismatch Volume: 59mL Infarction Location:None identified These results were called by telephone at the time of interpretation on 03/18/2020 at 3:07 pm to provider PRAMOD SETHI , who verbally acknowledged these results. IMPRESSION: CTA neck: 1. Significantly limited evaluation due to motion degradation and suboptimal contrast timing. 2. The common and internal carotid arteries are patent within the neck. Moderate/severe calcified plaque within the right carotid bifurcation and proximal ICA. Quantification of stenosis at this site is not possible due to the degree of motion degradation. Calcified plaque within the left carotid bifurcation and proximal ICA with estimated up to 60% stenosis of the proximal left ICA. 3. Streak and beam hardening artifact precludes adequate evaluation of the proximal vertebral arteries. Within this limitation, the vertebral arteries are patent within the neck. However, there is gradually diminishing enhancement within the mid to distal cervical right vertebral artery and the intracranial right vertebral artery now appears highly stenosed or occluded (however, this could be artifactual in the setting of suboptimal contrast timing). CTA head: 1. Limited evaluation due to suboptimal contrast timing. 2. Paucity of enhancement within the non dominant  intracranial right vertebral artery. This vessel may now be highly stenosed or occluded (however, this could be artifactual in the setting of suboptimal contrast timing). 3. Moderate/severe stenosis within a mid M2 left MCA branch vessel. 4. Elsewhere, no intracranial large vessel occlusion or proximal high-grade arterial stenosis is identified. 5. Prominent calcified plaque within the intracranial ICAs bilaterally with suspected moderate bilateral stenosis. CT perfusion head: The perfusion software identifies no core infarct. The perfusion software identifies no critically hypoperfused parenchyma utilizing the Tmax>6 seconds threshold. Electronically Signed: By: Jackey Loge DO On: 03/18/2020 15:08   DG Chest Portable 1 View  Result Date: 03/18/2020 CLINICAL DATA:  Unresponsive. EXAM: PORTABLE CHEST 1 VIEW COMPARISON:  01/19/2020 FINDINGS: The cardiac silhouette, mediastinal and hilar contours are within normal limits given the AP projection supine position of the patient. 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 No acute pulmonary findings. The bony thorax is intact.  Bilateral shoulder prostheses are noted. IMPRESSION: No acute cardiopulmonary findings. Electronically Signed   By: Rudie Meyer M.D.   On: 03/18/2020 14:49   EEG adult  Result Date: 03/19/2020 Charlsie Quest, MD     03/19/2020  2:12 PM Patient Name: Hannah Fischer MRN: [id number] Epilepsy Attending: Charlsie Quest Referring Physician/Provider: Dr Wallis Mart Date: 03/19/2020 Duration: 24.36 mins Patient history: 87 year old Caucasian lady with sudden onset of unresponsiveness and altered mental status. EEG to evaluate for seizure Level of alertness: lethargic AEDs during EEG study: None Technical aspects: This EEG  study was done with scalp electrodes positioned according to the 10-20 International system of electrode placement. Electrical activity was acquired at a sampling rate of 500Hz  and reviewed with a high  frequency filter of 70Hz  and a low frequency filter of 1Hz . EEG data were recorded continuously and digitally stored. Description: No posterior dominant rhythm was seen. EEG showed continuous generalized 3 to 6 Hz theta-delta slowing.  Hyperventilation and photic stimulation were not performed.   ABNORMALITY -Continuous slow, generalized IMPRESSION: This study is suggestive of moderate diffuse encephalopathy, nonspecific etiology. No seizures or epileptiform discharges were seen throughout the recording.   MR MRV HEAD W WO CONTRAST  Result Date: 03/21/2020 CLINICAL DATA:  Dural venous sinus thrombosis. EXAM: MR VENOGRAM HEAD WITHOUT AND WITH CONTRAST TECHNIQUE: Angiographic images of the intracranial venous structures were obtained using MRV technique without and with intravenous contrast. CONTRAST:  75mL GADAVIST GADOBUTROL 1 MMOL/ML IV SOLN COMPARISON:  None. FINDINGS: The left transverse sinus is mildly hypoplastic. Nonocclusive filling defect in the bilateral transverse sigmoid junction (series 600, image 91 and 242), more pronounced on the right. Small round filling defect within the left transverse sinus corresponds to inaccurate noise granulation. (Series 600, image 200). Superior sagittal sinus, internal cerebral veins, vein of Galen, straight sinus and jugular bulbs are patent without evidence of thrombus or stenosis. IMPRESSION: Nonocclusive filling defect in the bilateral transverse sigmoid junction, may represent nonocclusive thrombus. Electronically Signed   By: Charlsie Quest M.D.   On: 03/21/2020 13:25   CT HEAD CODE STROKE WO CONTRAST  Result Date: 03/18/2020 CLINICAL DATA:  Code stroke. Neuro deficit, acute, stroke suspected. EXAM: CT HEAD WITHOUT CONTRAST TECHNIQUE: Contiguous axial images were obtained from the base of the skull through the vertex without intravenous contrast. COMPARISON:  MRI/MRA head and MRA neck 11/24/2019. Head CT 11/23/2019. FINDINGS:  Brain: Mild-to-moderate cerebral atrophy. 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 Redemonstrated chronic encephalomalacia within the anterior right temporal lobe. Background mild-to-moderate patchy and ill-defined hypoattenuation within the cerebral white matter is nonspecific, but compatible with chronic small vessel ischemic disease. There is no acute intracranial hemorrhage. No acute demarcated cortical infarct. No extra-axial fluid collection. No evidence of intracranial mass. No midline shift. Redemonstrated mega cisterna magna Vascular: No hyperdense vessel.  Atherosclerotic calcifications. Skull: Normal. Negative for fracture or focal lesion. Sinuses/Orbits: Visualized orbits show no acute finding. Trace ethmoid sinus mucosal thickening. ASPECTS ([place] Stroke Program Early CT Score) - Ganglionic level infarction (caudate, lentiform nuclei, internal capsule, insula, M1-M3 cortex): 7 - Supraganglionic infarction (M4-M6 cortex): 3 Total score (0-10 with 10 being normal): 10 These results were called by telephone at the time of interpretation on 03/18/2020 at 2:25 pm to provider [facility] , who verbally acknowledged these results. IMPRESSION: No evidence of acute intracranial abnormality. 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 Redemonstrated chronic encephalomalacia within the anterior right temporal lobe. Stable background mild-to-moderate cerebral atrophy and chronic small vessel ischemic disease. Electronically Signed   By: 11/25/2019 DO   On: 03/18/2020 14:25   CT CHEST ABDOMEN PELVIS WO CONTRAST  Result Date: 03/25/2020 CLINICAL DATA:  Weakness.  Acute kidney injury.  Hydronephrosis. EXAM: CT CHEST, ABDOMEN AND PELVIS WITHOUT CONTRAST TECHNIQUE: Multidetector CT imaging of the chest, abdomen and pelvis was performed following the standard protocol without IV contrast. COMPARISON:  January 19, 2020 FINDINGS: CT CHEST FINDINGS  Cardiovascular: The heart size is unremarkable. There are coronary artery calcifications. There are atherosclerotic changes of the thoracic  aorta without evidence for an aneurysm. There is a trace pericardial effusion. Mediastinum/Nodes: -- No mediastinal lymphadenopathy. -- No hilar lymphadenopathy. -- No axillary lymphadenopathy. -- No supraclavicular lymphadenopathy. -- Normal thyroid gland where visualized. -the enteric feeding tube terminates near the GE junction. Repositioning is recommended. Lungs/Pleura: There is scarring versus atelectasis at the lung bases. The trachea is unremarkable. There is no pneumothorax or large pleural effusion. Musculoskeletal: No chest wall abnormality. No bony spinal canal stenosis. CT ABDOMEN PELVIS FINDINGS Hepatobiliary: The liver is normal. Status post cholecystectomy.There is no biliary ductal dilation. Pancreas: Normal contours without ductal dilatation. No peripancreatic fluid collection. Spleen: Unremarkable. Adrenals/Urinary Tract: --Adrenal glands: Unremarkable. --Right kidney/ureter: There is severe right-sided hydronephrosis which is new since January 19, 2020. There is a nonobstructing stone in the renal pelvis. The level of obstruction appears to be at the right UPJ. --Left kidney/ureter: There is mild left-sided hydroureteronephrosis without definite evidence for an obstructing stone. There is a punctate nonobstructing stone in the lower pole the left kidney. --Urinary bladder: The urinary bladder is significantly distended despite Foley catheter placement. Stomach/Bowel: --Stomach/Duodenum: No hiatal hernia or other gastric abnormality. Normal duodenal course and caliber. --Small bowel: Unremarkable. --Colon: Rectosigmoid diverticulosis without acute inflammation. --Appendix: Normal. Vascular/Lymphatic: Atherosclerotic calcification is present within the non-aneurysmal abdominal aorta, without hemodynamically significant stenosis. --No retroperitoneal  lymphadenopathy. --No mesenteric lymphadenopathy. --No pelvic or inguinal lymphadenopathy. Reproductive: Unremarkable Other: There is a small volume of free fluid the patient's pelvis. The abdominal wall is normal. Musculoskeletal. Moderate degenerative changes are noted throughout the visualized thoracolumbar spine. There is diffuse osteopenia. No definite acute displaced fracture. IMPRESSION: 1. Severe right-sided hydronephrosis, new since January 19, 2020. 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 There is no obstructing stone visualized on this study. 2. Mild left-sided hydroureteronephrosis without definite evidence for an obstructing stone. 3. The urinary bladder is significantly distended despite Foley catheter placement. Query catheter malfunction. 4. Enteric feeding tube terminates near the GE junction. Repositioning is recommended. 5. Rectosigmoid diverticulosis without acute inflammation. 6. Small volume of free fluid the patient's pelvis. Aortic Atherosclerosis (ICD10-I70.0). Electronically Signed   By: Katherine Mantle M.D.   On: 03/25/2020 15:53    Labs:  CBC: Recent Labs    03/19/20 1128 03/20/20 0504 03/21/20 0534 03/25/20 0648  WBC 7.2 6.2 5.6 6.5  HGB 12.8 13.4 13.0 11.2*  HCT 39.8 41.5 38.8 34.8*  PLT 213 186 187 208    COAGS: Recent Labs    11/23/19 2304 03/18/20 1420  INR 0.9 1.0  APTT 27 26    BMP: Recent Labs    03/24/20 0310 03/25/20 0648 03/26/20 0726 03/27/20 0225  NA 137 136 135 138  K 5.0 4.8 5.2* 5.2*  CL 101 102 97* 100  CO2 GLUCOSE 186* 297* 187* 155*  BUN 40* 43* 55* 61*  CALCIUM 9.0 9.7 9.2 9.2  CREATININE 2.05* 2.15* 2.67* 2.52*  GFRNONAA 23* 22* 17* 18*    LIVER FUNCTION TESTS: Recent Labs    11/23/19 2304 03/18/20 1420 03/26/20 0726 03/27/20 0225  BILITOT 0.9 0.6  --   --   AST 21 20  --   --   ALT 9 11  --   --   ALKPHOS 73 56  --   --   PROT 6.8 5.5*  --   --   ALBUMIN 3.7 2.9* 2.5* 2.5*     TUMOR MARKERS: No results for input(s): AFPTM, CEA, CA199, CHROMGRNA in the  last 8760 hours.  Assessment and Plan:  Rt hydronephrosis Scheduled for Rt percutaneous nephrostomy drain placement in IR  Risks and benefits of right PCN placement was discussed with the patient's husband [name] via phone including, but not limited to, infection, bleeding, significant bleeding causing loss or decrease in renal function or damage to adjacent structures.   All of the patient's questions were answered, patient is agreeable to proceed. Consent signed and in chart.  Thank you for this interesting consult.  I greatly enjoyed meeting Hannah Fischer and look forward to participating in their care.  A copy of this report was sent to the requesting provider on this date.  Electronically Signed: Robet Leu, PA-C 03/27/2020, 9:25 AM   I spent a total of 40 Minutes    in face to face in clinical consultation, greater than 50% of which was counseling/coordinating care for right PCN

## 2020-03-28 DIAGNOSIS — R338 Other retention of urine: Secondary | ICD-10-CM | POA: Diagnosis not present

## 2020-03-28 DIAGNOSIS — N179 Acute kidney failure, unspecified: Secondary | ICD-10-CM | POA: Diagnosis not present

## 2020-03-28 LAB — GLUCOSE, CAPILLARY
Glucose-Capillary: 135 mg/dL — ABNORMAL HIGH (ref 70–99)
Glucose-Capillary: 149 mg/dL — ABNORMAL HIGH (ref 70–99)
Glucose-Capillary: 156 mg/dL — ABNORMAL HIGH (ref 70–99)
Glucose-Capillary: 166 mg/dL — ABNORMAL HIGH (ref 70–99)
Glucose-Capillary: 181 mg/dL — ABNORMAL HIGH (ref 70–99)
Glucose-Capillary: 185 mg/dL — ABNORMAL HIGH (ref 70–99)
Glucose-Capillary: 193 mg/dL — ABNORMAL HIGH (ref 70–99)

## 2020-03-28 LAB — FOLATE: Folate: 16.1 ng/mL (ref >5.9)

## 2020-03-28 LAB — FERRITIN: Ferritin: 129 ng/mL (ref 11–307)

## 2020-03-28 LAB — IRON AND TIBC
Iron: 56 ug/dL (ref 28–170)
Saturation Ratios: 18 % (ref 10.4–31.8)
TIBC: 316 ug/dL (ref 250–450)
UIBC: 260 ug/dL

## 2020-03-28 LAB — RENAL FUNCTION PANEL
Albumin: 2.6 g/dL — ABNORMAL LOW (ref 3.5–5.0)
Anion gap: 14 (ref 5–15)
BUN: 51 mg/dL — ABNORMAL HIGH (ref 8–23)
CO2: 26 mmol/L (ref 22–32)
Calcium: 9.3 mg/dL (ref 8.9–10.3)
Chloride: 102 mmol/L (ref 98–111)
Creatinine, Ser: 1.77 mg/dL — ABNORMAL HIGH (ref 0.44–1.00)
GFR, Estimated: 28 mL/min — ABNORMAL LOW (ref >60)
Glucose, Bld: 178 mg/dL — ABNORMAL HIGH (ref 70–99)
Phosphorus: 5 mg/dL — ABNORMAL HIGH (ref 2.5–4.6)
Potassium: 4.5 mmol/L (ref 3.5–5.1)
Sodium: 142 mmol/L (ref 135–145)

## 2020-03-28 LAB — RETICULOCYTES
Immature Retic Fract: 26.3 % — ABNORMAL HIGH (ref 2.3–15.9)
RBC.: 3.59 MIL/uL — ABNORMAL LOW (ref 3.87–5.11)
Retic Count, Absolute: 78.3 10*3/uL (ref 19.0–186.0)
Retic Ct Pct: 2.2 % (ref 0.4–3.1)

## 2020-03-28 LAB — VITAMIN B12: Vitamin B-12: 266 pg/mL (ref 180–914)

## 2020-03-28 MED ORDER — FREE WATER
200.0000 mL | Freq: Four times a day (QID) | Status: DC
  Administered 2020-03-28 – 2020-04-08 (×36): 200 mL

## 2020-03-28 MED ORDER — SODIUM CHLORIDE 0.9 % IV SOLN: INTRAVENOUS | Status: DC

## 2020-03-28 NOTE — Progress Notes (Signed)
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  At that she had gone to bed last night and in the morning he had trouble waking her up. In the ED she was noted to be responsive to pain.  A code stroke was called.  She was outside the window for TPA.  An emergent CT did not show any acute abnormality.  A CTA was performed and did not reveal any obvious stenosis. An MRI of the brain was performed on 2/13:Punctate acute infarction in the left superior cerebellumadjacent to the superior cerebellar peduncle. Bilateral thalamic acute infarction extending back into the mid brain. This is consistent with artery of [place] infarction.  Subjective: More awake today. Agitated.    Assessment & Plan:   Principal Problem:   Acute encephalopathy secondary to bilateral thalamic infarcts -Prior history of embolic infarcts -The patient currently has an NG tube  -Neurology and the stroke team have seen her -Started on amantadine and Ritalin to help improve her lethargy- she is now more easy to awaken and does follow commands and try to communicate but continues to sleep most of the day and prefers to keep eyes closed -Due to her lethargy, she has a cor track and is receiving Osmolite -Palliative care has been consulted and the family would like to continue aggressive care but does not want CPR or shocks and thus she is a DNR -I appreciate palliative care following and speaking with family about the goals of care- The patient's husband will discuss further plans for PEG vs comfort care with his children. We will wait at least until Thursday or Friday to see if she will awaken (after nephrostomy placed and  renal function improves). - 2/22> more awake and slightly agitated- cont to follow mental status  Active Problems: Acute metabolic encephalopathy - waking up from her brain injury and agitated- have asked for a sitter - as sitter not available, she is in soft wrist restrains for now as she pulled out her IV.  CKD 3A- AKI due to dehydration -Baseline creatinine appears to be 1.0-1.3 -started free water per tube and watching I & O closely to prevent heart failure exacerbation - BUN & Cr continued to rise despite adequate hydration - 2/19>-  renal ultrasound showed right sided hydronephrosis and nephrolithiasis with a UPJ stone - have consulted nephrology and urology-  -  IR placed a right nephrostomy tube on 2/21- renal function now improving   Grade 2 diastolic heart failure-chronic -2D echo during this admission reveals grade 2 diastolic dysfunction - following fluid status closely     Diabetes mellitus 2 -Continue on insulin sliding scale every 4 hours while she is on tube feeds Component Value Date/Time   HGBA1C 7.6 (H) 03/18/2020 1936      Hypothyroidism -Continue Synthroid per tube    Time spent in minutes: 35 DVT prophylaxis: Heparin Code Status: DO NOT RESUSCITATE Family Communication: Husband-  Level of Care: Level of care: Progressive Disposition Plan:  Status is: Inpatient  Remains inpatient appropriate because:IV treatments appropriate due to intensity of illness or inability to take PO   Dispo: The patient is from: Home  Anticipated d/c is to: To be determined               Anticipated d/c date is: > 3 days              Patient currently is not medically stable to d/c.   Difficult to place patient No   Consultants:   Neurology  Urology  Nephrology Procedures:   Cor trac Antimicrobials:  Anti-infectives (From admission, onward)   Start     Dose/Rate Route Frequency Ordered Stop   03/26/20 0730  cefTRIAXone (ROCEPHIN) 1 g in sodium  chloride 0.9 % 100 mL IVPB        1 g 200 mL/hr over 30 Minutes Intravenous Every 24 hours 03/26/20 0644 03/31/20 0729   03/18/20 1900  azithromycin (ZITHROMAX) 500 mg in sodium chloride 0.9 % 250 mL IVPB  Status:  Discontinued        500 mg 250 mL/hr over 60 Minutes Intravenous Every 24 hours 03/18/20 1830 03/19/20 1106   03/18/20 1900  cefTRIAXone (ROCEPHIN) 2 g in sodium chloride 0.9 % 100 mL IVPB  Status:  Discontinued        2 g 200 mL/hr over 30 Minutes Intravenous Every 24 hours 03/18/20 1830 03/19/20 1106       Objective: Vitals:   03/28/20 0340 03/28/20 0500 03/28/20 0720 03/28/20 1201  BP: (!) 151/68  (!) 148/58 (!) 146/83  Pulse: 83  75 82  Resp: 18  18 18   Temp: 98.5 F (36.9 C)  98.3 F (36.8 C) 98.8 F (37.1 C)  TempSrc: Oral  Axillary Axillary  SpO2: 100%  97%   Weight:  108.2 kg    Height:        Intake/Output Summary (Last 24 hours) at 03/28/2020 1307 Last data filed at 03/28/2020 0900 Gross per 24 hour  Intake 0 ml  Output 2500 ml  Net -2500 ml   Filed Weights   03/23/20 0349 03/25/20 0500 03/28/20 0500  Weight: 79.3 kg 104.6 kg 108.2 kg    Examination: General exam: Appears comfortable  HEENT: PERRL - no sclera icterus or thrush- cor trac present Respiratory system: Clear to auscultation. Respiratory effort normal. Cardiovascular system: S1 & S2 heard, regular rate and rhythm Gastrointestinal system: Abdomen soft, non-tender, nondistended. Normal bowel sounds   Central nervous system: awakens when called- keeps eyes closed- not following commands- moves arms  Extremities: No cyanosis, clubbing or edema Skin: No rashes or ulcers Psychiatry:  unable to assess  Data Reviewed: I have personally reviewed following labs and imaging studies  CBC: Recent Labs  Lab 03/25/20 0648 03/27/20 1009  WBC 6.5 5.8  HGB 11.2* 11.7*  HCT 34.8* 35.9*  MCV 98.9 101.1*  PLT 208 222   Basic Metabolic Panel: Recent Labs  Lab 03/21/20 1701 03/22/20 0406  03/23/20 0333 03/24/20 0310 03/25/20 0648 03/26/20 0726 03/27/20 0225 03/28/20 0325  NA  --  143   < > 137 136 135 138 142  K  --  4.4   < > 5.0 4.8 5.2* 5.2* 4.5  CL  --  107   < > 101 102 97* 100 102  CO2  --  27   < > 24 24 27 26 26   GLUCOSE  --  193*   < > 186* 297* 187* 155* 178*  BUN  --  32*   < > 40* 43* 55* 61* 51*  CREATININE  --  1.85*   < > 2.05* 2.15* 2.67* 2.52* 1.77*  CALCIUM  --  8.9   < > 9.0 9.7 9.2 9.2 9.3  MG 2.1 2.2  --   --   --   --   --   --   PHOS 3.9 3.9  --   --   --  5.7* 6.8* 5.0*   < > = values in this interval not displayed.   GFR: Estimated Creatinine Clearance: 27.4 mL/min (A) (by C-G formula based on SCr of 1.77 mg/dL (H)). Liver Function Tests: Recent Labs  Lab 03/26/20 0726 03/27/20 0225 03/28/20 0325  ALBUMIN 2.5* 2.5* 2.6*   No results for input(s): LIPASE, AMYLASE in the last 168 hours. No results for input(s): AMMONIA in the last 168 hours. Coagulation Profile: Recent Labs  Lab 03/27/20 1009  INR 1.0   Cardiac Enzymes: No results for input(s): CKTOTAL, CKMB, CKMBINDEX, TROPONINI in the last 168 hours. BNP (last 3 results) No results for input(s): PROBNP in the last 8760 hours. HbA1C: No results for input(s): HGBA1C in the last 72 hours. CBG: Recent Labs  Lab 03/27/20 1954 03/28/20 0017 03/28/20 0341 03/28/20 0727 03/28/20 1159  GLUCAP 214* 135* 185* 149* 166*   Lipid Profile: No results for input(s): CHOL, HDL, LDLCALC, TRIG, CHOLHDL, LDLDIRECT in the last 72 hours. Thyroid Function Tests: No results for input(s): TSH, T4TOTAL, FREET4, T3FREE, THYROIDAB in the last 72 hours. Anemia Panel: Recent Labs    03/28/20 0900  VITAMINB12 266  FOLATE 16.1  FERRITIN 129  TIBC 316  IRON 56  RETICCTPCT 2.2   Urine analysis:    Component Value Date/Time   COLORURINE YELLOW 03/25/2020 1614   APPEARANCEUR CLOUDY (A) 03/25/2020 1614   LABSPEC 1.010 03/25/2020 1614   PHURINE 5.0 03/25/2020 1614   GLUCOSEU 50 (A)  03/25/2020 1614   HGBUR SMALL (A) 03/25/2020 1614   BILIRUBINUR NEGATIVE 03/25/2020 1614   KETONESUR NEGATIVE 03/25/2020 1614   PROTEINUR NEGATIVE 03/25/2020 1614   UROBILINOGEN 0.2 11/22/2013 0945   NITRITE NEGATIVE 03/25/2020 1614   LEUKOCYTESUR LARGE (A) 03/25/2020 1614   Sepsis Labs: (procalcitonin:4,lacticidven:4) ) Recent Results (from the past 240 hour(s))  Resp Panel by RT-PCR (Flu A&B, Covid) Nasopharyngeal Swab     Status: None   Collection Time: 03/18/20  3:10 PM   Specimen: Nasopharyngeal Swab; Nasopharyngeal(NP) swabs in vial transport medium  Result Value Ref Range Status   SARS Coronavirus 2 by RT PCR NEGATIVE NEGATIVE Final    Comment: (NOTE) SARS-CoV-2 target nucleic acids are NOT DETECTED.  The SARS-CoV-2 RNA is generally detectable in upper respiratory specimens during the acute phase of infection. The lowest concentration of SARS-CoV-2 viral copies this assay can detect is 138 copies/mL. A negative result does not preclude SARS-Cov-2 infection and should not be used as the sole basis for treatment or other patient management decisions. A negative result may occur with  improper specimen collection/handling, submission of specimen other than nasopharyngeal swab, presence of viral mutation(s) within the areas targeted by this assay, and inadequate number of viral copies(<138 copies/mL). A negative result must be combined with clinical observations, patient history, and epidemiological information. The expected result is Negative.  Fact Sheet for Patients:  BloggerCourse.com  Fact Sheet for Healthcare Providers:  SeriousBroker.it  This test is no t yet approved or cleared by the [place] FDA and  has been authorized for detection and/or diagnosis of SARS-CoV-2 by FDA under an Emergency Use Authorization (EUA). This EUA will remain  in effect (meaning this test can be used) for the duration of  the COVID-19 declaration under Section 564(b)(1) of the Act, 21 U.S.C.section 360bbb-3(b)(1), unless the authorization is terminated  or revoked sooner.       Influenza A by PCR NEGATIVE NEGATIVE Final   Influenza B by PCR NEGATIVE NEGATIVE Final    Comment: (NOTE) The Xpert Xpress SARS-CoV-2/FLU/RSV plus assay is intended as an aid in the diagnosis of influenza from Nasopharyngeal swab specimens and should not be used as a sole basis for treatment. Nasal washings and aspirates are unacceptable for Xpert Xpress SARS-CoV-2/FLU/RSV testing.  Fact Sheet for Patients: BloggerCourse.com  Fact Sheet for Healthcare Providers: SeriousBroker.it  This test is not yet approved or cleared by the [place] FDA and has been authorized for detection and/or diagnosis of SARS-CoV-2 by FDA under an Emergency Use Authorization (EUA). This EUA will remain in effect (meaning this test can be used) for the duration of the COVID-19 declaration under Section 564(b)(1) of the Act, 21 U.S.C. section 360bbb-3(b)(1), unless the authorization is terminated or revoked.  Performed at [facility], [address] [place], [place] [address]   Blood culture (routine x 2)     Status: None   Collection Time: 03/18/20  3:11 PM   Specimen: BLOOD  Result Value Ref Range Status   Specimen Description BLOOD SITE NOT SPECIFIED  Final   Special Requests   Final    BOTTLES DRAWN AEROBIC AND ANAEROBIC Blood Culture results may not be optimal due to an inadequate volume of blood received in culture bottles   Culture   Final    NO GROWTH 5 DAYS Performed at [facility], [address], [place], [place] [address]    Report Status 03/23/2020 FINAL  Final  Blood culture (routine x 2)     Status: None   Collection Time: 03/18/20  3:11 PM   Specimen: BLOOD  Result Value Ref Range Status   Specimen Description BLOOD SITE NOT SPECIFIED  Final   Special  Requests   Final    BOTTLES DRAWN AEROBIC AND ANAEROBIC Blood Culture results may not be optimal due to an inadequate volume of blood received in culture bottles   Culture   Final    NO GROWTH 5 DAYS Performed at [facility], [address], [place], [place] [address]    Report Status 03/23/2020 FINAL  Final  Culture, Urine     Status: Abnormal   Collection Time: 03/18/20  5:59 PM   Specimen: Urine, Catheterized  Result Value Ref Range Status   Specimen Description URINE, CATHETERIZED  Final   Special Requests Normal  Final   Culture (A)  Final    50,000 COLONIES/mL GROUP B STREP(S.AGALACTIAE)ISOLATED TESTING AGAINST S. AGALACTIAE NOT ROUTINELY PERFORMED DUE TO PREDICTABILITY OF AMP/PEN/VAN SUSCEPTIBILITY. Performed at [facility], [address] [place], [place] [address]    Report Status 03/19/2020 FINAL  Final  MRSA PCR Screening     Status: Abnormal   Collection Time: 03/18/20  6:35 PM   Specimen: Nasal Mucosa; Nasopharyngeal  Result Value Ref Range Status   MRSA by PCR POSITIVE (A) NEGATIVE Final    Comment:        The GeneXpert MRSA Assay (FDA approved for NASAL specimens only), is one component of a comprehensive MRSA colonization surveillance program. It is not intended to diagnose MRSA infection nor to guide or monitor treatment for MRSA infections. RESULT CALLED TO, READ BACK BY AND VERIFIED WITH: [facility] RN AT 2041 03/18/2020 MITCHELL,L Performed at [facility]  [facility], [address], [place], [place] [address]   Culture, Urine     Status: Abnormal   Collection Time: 03/26/20  6:44 AM   Specimen: Urine, Random  Result Value Ref Range Status   Specimen Description URINE, RANDOM  Final   Special Requests   Final    NONE Performed at [facility], [address], [place], [place] [address]    Culture >=100,000 COLONIES/mL YEAST (A)  Final   Report Status 2020/04/19 FINAL  Final         Radiology Studies: US RENAL  Result Date:  2020-04-19 CLINICAL DATA:  Hydronephrosis, diabetes mellitus, hypertension, history of renal failure EXAM: RENAL / URINARY TRACT ULTRASOUND COMPLETE COMPARISON:  03/25/2020 renal ultrasound and CT exams FINDINGS: Right Kidney: Renal measurements: 10.5 x 5.3 x 6.2 cm = volume: 181 mL. Cortical thinning. Normal cortical echogenicity. Severe hydronephrosis again identified. No mass or definite calcification identified. Left Kidney: Renal measurements: 9.6 x 4.5 x 4.2 cm = volume: 94 mL. Cortical thinning. Upper normal cortical echogenicity. No mass, hydronephrosis, or shadowing calcification. Bladder: Decompressed by Foley catheter, unable to evaluate. Other: N/A IMPRESSION: Persistent severe RIGHT hydronephrosis. BILATERAL renal cortical thinning. Electronically Signed   By: Ulyses Southward M.D.   On: 04/19/2020 08:17   IR US Guidance  Result Date: 04/19/20 INDICATION: 87 year old female with right hydronephrosis and azotemia. EXAM: 1. ULTRASOUND GUIDANCE FOR PUNCTURE OF THE RIGHT RENAL COLLECTING SYSTEM 2. RIGHT PERCUTANEOUS NEPHROSTOMY TUBE PLACEMENT. COMPARISON:  03/25/2020, 04-19-20 MEDICATIONS: None. ANESTHESIA/SEDATION: Moderate (conscious) sedation was employed during this procedure. A total of Versed 1 mg and Fentanyl 0 mcg was administered intravenously. Moderate Sedation Time: 17 minutes. The patient's level of consciousness and vital signs were monitored continuously by radiology nursing throughout the procedure under my direct supervision. CONTRAST:  10 mL mL Omnipaque 300-administered into the renal collecting system FLUOROSCOPY TIME:  One minutes 30 seconds, 9 mGy COMPLICATIONS: None immediate. PROCEDURE: The procedure, risks, benefits, and alternatives were explained to the patient. Questions regarding the procedure were encouraged and answered. The patient understands and consents to the procedure. A timeout was performed prior to the initiation of the procedure. The right flank region was prepped  and draped in the usual sterile fashion and a sterile drape was applied covering the operative field. A sterile gown and sterile gloves were used for the procedure. Local anesthesia was provided with 1% Lidocaine with epinephrine. Ultrasound was used to localize the right kidney. Under direct ultrasound guidance, a 20 gauge needle was advanced into the renal collecting system. An ultrasound image documentation was performed. Access within the collecting system was confirmed with the efflux of urine followed by limited contrast injection. Over a Nitrex wire, the tract was dilated with an Accustick stent. Next, under intermittent fluoroscopic guidance and over a short Amplatz wire, the track was dilated ultimately allowing placement of a 10.2-French percutaneous nephrostomy catheter which was advanced to the level of the renal pelvis where the coil was formed and locked. Contrast was injected and several spot fluoroscopic images were obtained in various obliquities. The catheter was secured at the skin with a Prolene retention suture and stat lock device and connected to a gravity bag was placed. Dressings were applied. The patient tolerated procedure well without immediate postprocedural complication. FINDINGS: Ultrasound scanning demonstrates a moderate to severely dilated right collecting system. There is an apparent ureteropelvic junction obstruction. Under a combination of ultrasound and fluoroscopic guidance, a posterior inferior calix was targeted allowing placement  of a 10.2 French percutaneous nephrostomy catheter with end coiled and locked within the renal pelvis. Contrast injection confirmed appropriate positioning. IMPRESSION: Successful ultrasound and fluoroscopic guided placement of a right sided 10.2 [place] PCN. Limited nephrostogram demonstrates severe hydronephrosis and ureteropelvic junction obstruction. PLAN: Return in 4 weeks for routine check and exchange pending interim definitive Urological  intervention. Marliss Cootsylan Suttle, MD [facility] Electronically Signed   By: Marliss Cootsylan  Suttle MD   On: 03/27/2020 14:46   IR NEPHROSTOMY PLACEMENT RIGHT  Result Date: 03/27/2020 INDICATION: 87 year old female with right hydronephrosis and azotemia. EXAM: 1. ULTRASOUND GUIDANCE FOR PUNCTURE OF THE RIGHT RENAL COLLECTING SYSTEM 2. RIGHT PERCUTANEOUS NEPHROSTOMY TUBE PLACEMENT. COMPARISON:  03/25/2020, 03/27/2020 MEDICATIONS: None. ANESTHESIA/SEDATION: Moderate (conscious) sedation was employed during this procedure. A total of Versed 1 mg and Fentanyl 0 mcg was administered intravenously. Moderate Sedation Time: 17 minutes. The patient's level of consciousness and vital signs were monitored continuously by radiology nursing throughout the procedure under my direct supervision. CONTRAST:  10 mL mL Omnipaque 300-administered into the renal collecting system FLUOROSCOPY TIME:  One minutes 30 seconds, 9 mGy COMPLICATIONS: None immediate. PROCEDURE: The procedure, risks, benefits, and alternatives were explained to the patient. Questions regarding the procedure were encouraged and answered. The patient understands and consents to the procedure. A timeout was performed prior to the initiation of the procedure. The right flank region was prepped and draped in the usual sterile fashion and a sterile drape was applied covering the operative field. A sterile gown and sterile gloves were used for the procedure. Local anesthesia was provided with 1% Lidocaine with epinephrine. Ultrasound was used to localize the right kidney. Under direct ultrasound guidance, a 20 gauge needle was advanced into the renal collecting system. An ultrasound image documentation was performed. Access within the collecting system was confirmed with the efflux of urine followed by limited contrast injection. Over a Nitrex wire, the tract was dilated with an Accustick stent. Next, under intermittent  fluoroscopic guidance and over a short Amplatz wire, the track was dilated ultimately allowing placement of a 10.2-French percutaneous nephrostomy catheter which was advanced to the level of the renal pelvis where the coil was formed and locked. Contrast was injected and several spot fluoroscopic images were obtained in various obliquities. The catheter was secured at the skin with a Prolene retention suture and stat lock device and connected to a gravity bag was placed. Dressings were applied. The patient tolerated procedure well without immediate postprocedural complication. FINDINGS: Ultrasound scanning demonstrates a moderate to severely dilated right collecting system. There is an apparent ureteropelvic junction obstruction. Under a combination of ultrasound and fluoroscopic guidance, a posterior inferior calix was targeted allowing placement of a 10.2 French percutaneous nephrostomy catheter with end coiled and locked within the renal pelvis. Contrast injection confirmed appropriate positioning. IMPRESSION: Successful ultrasound and fluoroscopic guided placement of a right sided 10.2 [place] PCN. Limited nephrostogram demonstrates severe hydronephrosis and ureteropelvic junction obstruction. PLAN: Return in 4 weeks for routine check and exchange pending interim definitive Urological intervention. Marliss Cootsylan Suttle, MD [facility] Electronically Signed   By: Marliss Cootsylan  Suttle MD   On: 03/27/2020 14:46      Scheduled Meds: . amantadine  50 mg Per Tube Daily  . amLODipine  5 mg Per Tube Daily  . chlorhexidine  15 mL Mouth Rinse BID  . Chlorhexidine Gluconate Cloth  6 each Topical Daily  . clopidogrel  75 mg Per Tube Q breakfast  .  feeding supplement (PROSource TF)  45 mL Per Tube TID  . free water  200 mL Per Tube Q4H  . heparin  5,000 Units Subcutaneous Q8H  . insulin aspart  0-9 Units Subcutaneous Q4H  . levothyroxine  112 mcg Per Tube QAC  breakfast  . mouth rinse  15 mL Mouth Rinse q12n4p  . sodium chloride flush  3 mL Intravenous Once   Continuous Infusions: . sodium chloride 10 mL/hr at 03/24/20 0305  . cefTRIAXone (ROCEPHIN)  IV 1 g (03/28/20 0754)  . feeding supplement (OSMOLITE 1.2 CAL) 1,000 mL (03/28/20 0615)     LOS: 10 days      Calvert Cantor, MD [facility] Pager: [email or web address] 03/28/2020, 1:07 PM

## 2020-03-28 NOTE — Progress Notes (Signed)
  Speech Language Pathology Treatment: Dysphagia  Patient Details Name: Hannah Fischer MRN: [id number] DOB: 1932/04/19 Today's Date: 03/28/2020 Time: 3220-2542 SLP Time Calculation (min) (ACUTE ONLY): 25 min  Assessment / Plan / Recommendation Clinical Impression  Coughs were noted intermittently throughout PO trials of honey thick liquids and purees. However, PO intake of all consistencies was limited due to pt's current mentation and oral deficits despite Max assist from SLP including full physical support to hold her head up. Pt benefited from Max multimodal cues to facilitate arousability for PO intake and also benefited from these cues for safety to initiate swallows due to oral holding of boluses. Given pt's current mentation, recommend that pt remain NPO and SLP will continue to follow up for PO trials.    HPI HPI: [name] is a 87 y.o. female with bilateral medial thalamic and punctate left superior cerebellar infarcts. PMHx significant for multiple CVAs with most recent acute/subacute R frontal lobe and L parietal and occipital lobe infarcts ~3 months ago, residual L-sided hemiplegia, hypothyroidism, GERD, HTN, TIA, DM, PE, anxiety, HTN, and HLD.      SLP Plan  Continue with current plan of care       Recommendations  Diet recommendations: NPO Medication Administration: Via alternative means                Oral Care Recommendations: Oral care QID Follow up Recommendations: Skilled Nursing facility SLP Visit Diagnosis: Dysphagia, oropharyngeal phase (R13.12) Plan: Continue with current plan of care       GO                Zettie Cooley., SLP Student 03/28/2020, 12:56 PM

## 2020-03-28 NOTE — Progress Notes (Signed)
Nephrology Follow-Up note   Assessment/Recommendations: Hannah Fischer is a/an 87 y.o. female with a past medical history significant for HTN, DM 2, CVA, hypothyroidism who present w/ stroke now complicated by AKI  AKI on CKD 3a: BL 1-1.2. Slowly progressive Crt rise now 2.1. Good UOP. Did receive contrast on 2/12 but unlikely having an effect this far out.    Continues to have worsening creatinine with CT scan concerning for severe hydronephrosis felt to be cause of AKI.  Also concern for possible infection based on urinalysis. -Distended bladder on CT scan despite Foley catheter being in place - note bladder decompressed by foley on renal US 2/21.  Renal US on 2/21 with severe persistent hydro  ------ - improving s/p nephrostomy tube placement.  Appreciate IR and urology  - repeat gentle IV fluids NS at 75/hr x 24 hours  Urinary tract infection.  Associated with obstruction as above.  On ceftriaxone. urine culture with group B strep. Intervention for obstruction as above  Hypertension: acceptable control  DM2: Management per primary team  Hypothyroidism: supplementation per primary   Hyperkalemia: Likely associated with obstruction and AKI. Resolved with improvement in renal function and s/p lokelma  Encephalopathy/CVA: Minimal interaction which is consistent with prior charting.  Management per primary team  Nephrology will sign off.  Renal function thankfully improving.  Her team has been discussing goals and palliative has been consulted.  Please do not hesitate to contact us with any questions.   ____________________________________________________  Interval History/Subjective:  She had right perc nephrostomy tube placed on 2/21 with IR. She had 2.5 liters UOP over 2/21.  She does not offer any additional hx  Review of systems:   Unable to obtain 2/2 AMS  Medications:  Current Facility-Administered Medications  Medication Dose Route Frequency Provider Last Rate Last  Admin  . 0.9 %  sodium chloride infusion  250 mL Intravenous Continuous Stretch, Marveen Reeks, MD 10 mL/hr at 03/24/20 0305 Restarted at 03/24/20 0305  . amantadine (SYMMETREL) 50 MG/5ML solution 50 mg  50 mg Per Tube Daily Bailey-Modzik, Delila A, NP   50 mg at 03/28/20 1025  . amLODipine (NORVASC) tablet 5 mg  5 mg Per Tube Daily Etter Sjogren, MD   5 mg at 03/28/20 1025  . cefTRIAXone (ROCEPHIN) 1 g in sodium chloride 0.9 % 100 mL IVPB  1 g Intravenous Q24H Darnell Level, MD 200 mL/hr at 03/28/20 0754 1 g at 03/28/20 0754  . chlorhexidine (PERIDEX) 0.12 % solution 15 mL  15 mL Mouth Rinse BID Karie Fetch P, DO   15 mL at 03/28/20 1025  . Chlorhexidine Gluconate Cloth 2 % PADS 6 each  6 each Topical Daily Steffanie Dunn, DO   6 each at 03/28/20 1025  . clopidogrel (PLAVIX) tablet 75 mg  75 mg Per Tube Q breakfast Leander Rams, RPH   75 mg at 03/28/20 1025  . docusate (COLACE) 50 MG/5ML liquid 100 mg  100 mg Per Tube BID PRN Kathlen Mody, MD      . feeding supplement (OSMOLITE 1.2 CAL) liquid 1,000 mL  1,000 mL Per Tube Continuous Olalere, Adewale A, MD 45 mL/hr at 03/28/20 0615 1,000 mL at 03/28/20 0615  . feeding supplement (PROSource TF) liquid 45 mL  45 mL Per Tube TID Olalere, Adewale A, MD   45 mL at 03/28/20 1025  . free water 200 mL  200 mL Per Tube Q6H Calvert Cantor, MD      . heparin injection  5,000 Units  5,000 Units Subcutaneous Q8H Ralene Muskrat, PA-C   5,000 Units at 03/28/20 8502  . insulin aspart (novoLOG) injection 0-9 Units  0-9 Units Subcutaneous Q4H Selmer Dominion B, NP   1 Units at 03/28/20 0754  . iohexol (OMNIPAQUE) 300 MG/ML solution 50 mL  50 mL Per Tube Once PRN Suttle, Thressa Sheller, MD      . levothyroxine (SYNTHROID) tablet 112 mcg  112 mcg Per Tube QAC breakfast Kathlen Mody, MD   112 mcg at 03/28/20 0615  . MEDLINE mouth rinse  15 mL Mouth Rinse q12n4p Karie Fetch P, DO   15 mL at 03/27/20 1601  . polyethylene glycol (MIRALAX / GLYCOLAX) packet 17 g  17 g Per Tube  Daily PRN Kathlen Mody, MD      . sodium chloride flush (NS) 0.9 % injection 3 mL  3 mL Intravenous Once Milagros Loll, MD         Physical Exam: Vitals:   03/28/20 0720 03/28/20 1201  BP: (!) 148/58 (!) 146/83  Pulse: 75 82  Resp: 18 18  Temp: 98.3 F (36.8 C) 98.8 F (37.1 C)  SpO2: 97%    No intake/output data recorded.  Intake/Output Summary (Last 24 hours) at 03/28/2020 1324 Last data filed at 03/28/2020 0900 Gross per 24 hour  Intake 0 ml  Output 2500 ml  Net -2500 ml   General elderly female in bed in no acute distress  HEENT normocephalic atraumatic extraocular movements intact sclera anicteric Neck supple trachea midline Lungs clear to auscultation bilaterally normal work of breathing at rest ; on 2 liters Heart S1S2 no rub Abdomen soft nontender nondistended Extremities no edema  Neuro - does not follow commands or open eyes, minimally interactive   Test Results I personally reviewed new and old clinical labs and radiology tests Lab Results  Component Value Date   NA 142 03/28/2020   K 4.5 03/28/2020   CL 102 03/28/2020   CO2 26 03/28/2020   BUN 51 (H) 03/28/2020   CREATININE 1.77 (H) 03/28/2020   GFR 70.52 10/15/2013   CALCIUM 9.3 03/28/2020   ALBUMIN 2.6 (L) 03/28/2020   PHOS 5.0 (H) 03/28/2020    Estanislado Emms, MD 03/28/2020 1:34 PM

## 2020-03-28 NOTE — Progress Notes (Signed)
Referring Physician(s): Dr Butler Denmark Dr Dub Amis  Supervising Physician: Simonne Come  [facility]  Chief Complaint:  Rt PCN placed in IR yesterday Hydronephrosis   Subjective:  Asleep-- difficult to arouse Rolled pt to see site with help of tech Site good OP blood tinged; over 1L collected Cr 1.77 today (2.5)   Allergies: Atorvastatin, Ezetimibe-simvastatin, Lovastatin, Metformin hcl, Pravastatin, Codeine, Simvastatin, Sulfa antibiotics, and Sulfites  Medications: Prior to Admission medications   Medication Sig Start Date End Date Taking? Authorizing Provider  amLODipine (NORVASC) 5 MG tablet Take 5 mg by mouth every morning. 12/22/19  Yes [provider]  B Complex Vitamins (VITAMIN B COMPLEX) TABS Take 1 tablet by mouth daily after breakfast.   Yes [provider]  Cholecalciferol (VITAMIN D3) 2000 UNITS TABS Take 2,000 Units by mouth daily.    Yes [provider]  clopidogrel (PLAVIX) 75 MG tablet Take 1 tablet (75 mg total) by mouth daily with breakfast. 11/17/11  Yes Vassie Loll, MD  DULoxetine (CYMBALTA) 30 MG capsule TAKE 1 CAPSULE(30 MG) BY MOUTH DAILY Patient taking differently: Take 30 mg by mouth daily. 08/25/19  Yes Marcello Fennel, MD  levothyroxine (SYNTHROID) 112 MCG tablet Take 112 mcg by mouth daily before breakfast.   Yes [provider]  losartan (COZAAR) 100 MG tablet Take 100 mg by mouth daily. 04/29/19  Yes [provider]  nitrofurantoin (MACRODANTIN) 100 MG capsule Take 100 mg by mouth daily. 03/09/20  Yes [provider]  Omega-3 Fatty Acids (FISH OIL) 1000 MG CAPS Take 1,000 mg by mouth 2 (two) times daily.   Yes [provider]  pioglitazone (ACTOS) 45 MG tablet Take 45 mg by mouth daily.   Yes [provider]  propranolol (INDERAL) 40 MG tablet Take 40 mg by mouth daily.  04/13/17  Yes [provider]  traZODone (DESYREL) 50 MG tablet TAKE 1 TABLET  BY MOUTH AT BEDTIME Patient taking differently: Take 50 mg by mouth at bedtime. 07/01/17  Yes Patel, Maryln Gottron, MD  levothyroxine (SYNTHROID, LEVOTHROID) 100 MCG tablet Take 1 tablet (100 mcg total) by mouth daily. Patient not taking: No sig reported 11/17/11   Vassie Loll, MD  methocarbamol (ROBAXIN) 500 MG tablet Take 1 tablet (500 mg total) by mouth every 8 (eight) hours as needed for muscle spasms. Patient not taking: No sig reported 04/13/19   Marcello Fennel, MD  rosuvastatin (CRESTOR) 5 MG tablet Take 1 tablet (5 mg total) by mouth 2 (two) times a week for 30 doses. 11/29/19 03/10/20  Azucena Fallen, MD     Vital Signs: BP (!) 148/58 (BP Location: Right Arm)   Pulse 75   Temp 98.3 F (36.8 C) (Axillary)   Resp 18   Ht 5\' 3"  (1.6 m)   Wt 238 lb 8.6 oz (108.2 kg)   SpO2 97%   BMI 42.26 kg/m   Physical Exam Skin:    General: Skin is warm.     Comments: Site of drain is clean and dry NT no sign of redness OP blood tinged-- over 1 L so far Cr trending down      Imaging: RENAL  Result Date: 03/27/2020 CLINICAL DATA:  Hydronephrosis, diabetes mellitus, hypertension, history of renal failure EXAM: RENAL / URINARY TRACT ULTRASOUND COMPLETE COMPARISON:  03/25/2020 renal ultrasound and CT exams FINDINGS: Right Kidney: Renal measurements: 10.5 x 5.3 x 6.2 cm = volume: 181 mL. Cortical thinning. Normal cortical echogenicity. Severe  hydronephrosis again identified. No mass or definite calcification identified. Left Kidney: Renal measurements: 9.6 x 4.5 x 4.2 cm = volume: 94 mL. Cortical thinning. Upper normal cortical echogenicity. No mass, hydronephrosis, or shadowing calcification. Bladder: Decompressed by Foley catheter, unable to evaluate. Other: N/A IMPRESSION: Persistent severe RIGHT hydronephrosis. BILATERAL renal cortical thinning. Electronically Signed   By: Ulyses Southward M.D.   On: 03/27/2020 08:17   US RENAL  Result Date: 03/25/2020 CLINICAL DATA:  88 year old  female with renal failure. EXAM: RENAL / URINARY TRACT ULTRASOUND COMPLETE COMPARISON:  04/02/2019 CT FINDINGS: Right Kidney: Renal measurements: 9.9 x 5.7 x 5.2 cm = volume: 150 mL. Moderate to severe RIGHT hydronephrosis is identified. A 1.2 cm calculus in the region of the renal pelvis/possible UPJ noted. Renal cortical thinning is noted. Left Kidney: Not visualized Bladder: Ureteral jets are not noted.  Otherwise appears normal. Other: None. IMPRESSION: Moderate to severe RIGHT hydronephrosis with a 1.2 cm calculus in the region of the renal pelvis/possible UPJ. Consider further evaluation with CT to evaluate site of obstruction. LEFT kidney not visualized. These results will be called to the ordering clinician or representative by the Radiologist Assistant, and communication documented in the PACS or [employer]. Electronically Signed   By: Harmon Pier M.D.   On: 03/25/2020 11:45   IR US Guidance  Result Date: 03/27/2020 INDICATION: 87 year old female with right hydronephrosis and azotemia. EXAM: 1. ULTRASOUND GUIDANCE FOR PUNCTURE OF THE RIGHT RENAL COLLECTING SYSTEM 2. RIGHT PERCUTANEOUS NEPHROSTOMY TUBE PLACEMENT. COMPARISON:  03/25/2020, 03/27/2020 MEDICATIONS: None. ANESTHESIA/SEDATION: Moderate (conscious) sedation was employed during this procedure. A total of Versed 1 mg and Fentanyl 0 mcg was administered intravenously. Moderate Sedation Time: 17 minutes. The patient's level of consciousness and vital signs were monitored continuously by radiology nursing throughout the procedure under my direct supervision. CONTRAST:  10 mL mL Omnipaque 300-administered into the renal collecting system FLUOROSCOPY TIME:  One minutes 30 seconds, 9 mGy COMPLICATIONS: None immediate. PROCEDURE: The procedure, risks, benefits, and alternatives were explained to the patient. Questions regarding the procedure were encouraged and answered. The patient understands and consents to the procedure. A timeout was  performed prior to the initiation of the procedure. The right flank region was prepped and draped in the usual sterile fashion and a sterile drape was applied covering the operative field. A sterile gown and sterile gloves were used for the procedure. Local anesthesia was provided with 1% Lidocaine with epinephrine. Ultrasound was used to localize the right kidney. Under direct ultrasound guidance, a 20 gauge needle was advanced into the renal collecting system. An ultrasound image documentation was performed. Access within the collecting system was confirmed with the efflux of urine followed by limited contrast injection. Over a Nitrex wire, the tract was dilated with an Accustick stent. Next, under intermittent fluoroscopic guidance and over a short Amplatz wire, the track was dilated ultimately allowing placement of a 10.2-French percutaneous nephrostomy catheter which was advanced to the level of the renal pelvis where the coil was formed and locked. Contrast was injected and several spot fluoroscopic images were obtained in various obliquities. The catheter was secured at the skin with a Prolene retention suture and stat lock device and connected to a gravity bag was placed. Dressings were applied. The patient tolerated procedure well without immediate postprocedural complication. FINDINGS: Ultrasound scanning demonstrates a moderate to severely dilated right collecting system. There is an apparent ureteropelvic junction obstruction. Under a combination of ultrasound and fluoroscopic guidance, a posterior inferior calix was targeted  allowing placement of a 10.2 French percutaneous nephrostomy catheter with end coiled and locked within the renal pelvis. Contrast injection confirmed appropriate positioning. IMPRESSION: Successful ultrasound and fluoroscopic guided placement of a right sided 10.2 [place] PCN. Limited nephrostogram demonstrates severe hydronephrosis and ureteropelvic junction obstruction. PLAN:  Return in 4 weeks for routine check and exchange pending interim definitive Urological intervention. Marliss Cootsylan Suttle, MD [facility] [facility] Electronically Signed   By: Marliss Cootsylan  Suttle MD   On: 03/27/2020 14:46   IR NEPHROSTOMY PLACEMENT RIGHT  Result Date: 03/27/2020 INDICATION: 87 year old female with right hydronephrosis and azotemia. EXAM: 1. ULTRASOUND GUIDANCE FOR PUNCTURE OF THE RIGHT RENAL COLLECTING SYSTEM 2. RIGHT PERCUTANEOUS NEPHROSTOMY TUBE PLACEMENT. COMPARISON:  03/25/2020, 03/27/2020 MEDICATIONS: None. ANESTHESIA/SEDATION: Moderate (conscious) sedation was employed during this procedure. A total of Versed 1 mg and Fentanyl 0 mcg was administered intravenously. Moderate Sedation Time: 17 minutes. The patient's level of consciousness and vital signs were monitored continuously by radiology nursing throughout the procedure under my direct supervision. CONTRAST:  10 mL mL Omnipaque 300-administered into the renal collecting system FLUOROSCOPY TIME:  One minutes 30 seconds, 9 mGy COMPLICATIONS: None immediate. PROCEDURE: The procedure, risks, benefits, and alternatives were explained to the patient. Questions regarding the procedure were encouraged and answered. The patient understands and consents to the procedure. A timeout was performed prior to the initiation of the procedure. The right flank region was prepped and draped in the usual sterile fashion and a sterile drape was applied covering the operative field. A sterile gown and sterile gloves were used for the procedure. Local anesthesia was provided with 1% Lidocaine with epinephrine. Ultrasound was used to localize the right kidney. Under direct ultrasound guidance, a 20 gauge needle was advanced into the renal collecting system. An ultrasound image documentation was performed. Access within the collecting system was confirmed with the efflux of urine followed by limited contrast injection. Over a  Nitrex wire, the tract was dilated with an Accustick stent. Next, under intermittent fluoroscopic guidance and over a short Amplatz wire, the track was dilated ultimately allowing placement of a 10.2-French percutaneous nephrostomy catheter which was advanced to the level of the renal pelvis where the coil was formed and locked. Contrast was injected and several spot fluoroscopic images were obtained in various obliquities. The catheter was secured at the skin with a Prolene retention suture and stat lock device and connected to a gravity bag was placed. Dressings were applied. The patient tolerated procedure well without immediate postprocedural complication. FINDINGS: Ultrasound scanning demonstrates a moderate to severely dilated right collecting system. There is an apparent ureteropelvic junction obstruction. Under a combination of ultrasound and fluoroscopic guidance, a posterior inferior calix was targeted allowing placement of a 10.2 French percutaneous nephrostomy catheter with end coiled and locked within the renal pelvis. Contrast injection confirmed appropriate positioning. IMPRESSION: Successful ultrasound and fluoroscopic guided placement of a right sided 10.2 [place] PCN. Limited nephrostogram demonstrates severe hydronephrosis and ureteropelvic junction obstruction. PLAN: Return in 4 weeks for routine check and exchange pending interim definitive Urological intervention. Marliss Cootsylan Suttle, MD [facility] Electronically Signed   By: Marliss Cootsylan  Suttle MD   On: 03/27/2020 14:46   CT CHEST ABDOMEN PELVIS WO CONTRAST  Result Date: 03/25/2020 CLINICAL DATA:  Weakness.  Acute kidney injury.  Hydronephrosis. EXAM: CT CHEST, ABDOMEN AND PELVIS WITHOUT CONTRAST TECHNIQUE: Multidetector CT imaging of the chest, abdomen and pelvis was performed following the standard protocol without IV contrast. COMPARISON:  January 19, 2020 FINDINGS: CT CHEST FINDINGS  Cardiovascular: The heart size is unremarkable. There are coronary artery calcifications. There are atherosclerotic changes of the thoracic aorta without evidence for an aneurysm. There is a trace pericardial effusion. Mediastinum/Nodes: -- No mediastinal lymphadenopathy. -- No hilar lymphadenopathy. -- No axillary lymphadenopathy. -- No supraclavicular lymphadenopathy. -- Normal thyroid gland where visualized. -the enteric feeding tube terminates near the GE junction. Repositioning is recommended. Lungs/Pleura: There is scarring versus atelectasis at the lung bases. The trachea is unremarkable. There is no pneumothorax or large pleural effusion. Musculoskeletal: No chest wall abnormality. No bony spinal canal stenosis. CT ABDOMEN PELVIS FINDINGS Hepatobiliary: The liver is normal. Status post cholecystectomy.There is no biliary ductal dilation. Pancreas: Normal contours without ductal dilatation. No peripancreatic fluid collection. Spleen: Unremarkable. Adrenals/Urinary Tract: --Adrenal glands: Unremarkable. --Right kidney/ureter: There is severe right-sided hydronephrosis which is new since January 19, 2020. There is a nonobstructing stone in the renal pelvis. The level of obstruction appears to be at the right UPJ. --Left kidney/ureter: There is mild left-sided hydroureteronephrosis without definite evidence for an obstructing stone. There is a punctate nonobstructing stone in the lower pole the left kidney. --Urinary bladder: The urinary bladder is significantly distended despite Foley catheter placement. Stomach/Bowel: --Stomach/Duodenum: No hiatal hernia or other gastric abnormality. Normal duodenal course and caliber. --Small bowel: Unremarkable. --Colon: Rectosigmoid diverticulosis without acute inflammation. --Appendix: Normal. Vascular/Lymphatic: Atherosclerotic calcification is present within the non-aneurysmal abdominal aorta, without hemodynamically significant stenosis. --No retroperitoneal  lymphadenopathy. --No mesenteric lymphadenopathy. --No pelvic or inguinal lymphadenopathy. Reproductive: Unremarkable Other: There is a small volume of free fluid the patient's pelvis. The abdominal wall is normal. Musculoskeletal. Moderate degenerative changes are noted throughout the visualized thoracolumbar spine. There is diffuse osteopenia. No definite acute displaced fracture. IMPRESSION: 1. Severe right-sided hydronephrosis, new since January 19, 2020. 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 There is no obstructing stone visualized on this study. 2. Mild left-sided hydroureteronephrosis without definite evidence for an obstructing stone. 3. The urinary bladder is significantly distended despite Foley catheter placement. Query catheter malfunction. 4. Enteric feeding tube terminates near the GE junction. Repositioning is recommended. 5. Rectosigmoid diverticulosis without acute inflammation. 6. Small volume of free fluid the patient's pelvis. Aortic Atherosclerosis (ICD10-I70.0). Electronically Signed   By: Katherine Mantle M.D.   On: 03/25/2020 15:53    Labs:  CBC: Recent Labs    03/20/20 0504 03/21/20 0534 03/25/20 0648 03/27/20 1009  WBC 6.2 5.6 6.5 5.8  HGB 13.4 13.0 11.2* 11.7*  HCT 41.5 38.8 34.8* 35.9*  PLT 186 187 208 222    COAGS: Recent Labs    11/23/19 2304 03/18/20 1420 03/27/20 1009  INR 0.9 1.0 1.0  APTT 27 26  --     BMP: Recent Labs    03/25/20 0648 03/26/20 0726 03/27/20 0225 03/28/20 0325  NA 136 135 138 142  K 4.8 5.2* 5.2* 4.5  CL 102 97* 100 102  CO2 24 27 26 26   GLUCOSE 297* 187* 155* 178*  BUN 43* 55* 61* 51*  CALCIUM 9.7 9.2 9.2 9.3  CREATININE 2.15* 2.67* 2.52* 1.77*  GFRNONAA 22* 17* 18* 28*    LIVER FUNCTION TESTS: Recent Labs    11/23/19 2304 03/18/20 1420 03/26/20 0726 03/27/20 0225 03/28/20 0325  BILITOT 0.9 0.6  --   --   --   AST 21 20  --   --   --   ALT 9 11  --   --   --  ALKPHOS 73 56  --   --   --    PROT 6.8 5.5*  --   --   --   ALBUMIN 3.7 2.9* 2.5* 2.5* 2.6*    Assessment and Plan:  R PCN intact OP good; blood tinged Plan per Dr Marlou Porch  Electronically Signed: Robet Leu, PA-C 03/28/2020, 9:51 AM   I spent a total of 15 Minutes at the the patient's bedside AND on the patient's hospital floor or unit, greater than 50% of which was counseling/coordinating care for R PCN

## 2020-03-29 DIAGNOSIS — G9340 Encephalopathy, unspecified: Secondary | ICD-10-CM | POA: Diagnosis not present

## 2020-03-29 DIAGNOSIS — N179 Acute kidney failure, unspecified: Secondary | ICD-10-CM | POA: Diagnosis not present

## 2020-03-29 DIAGNOSIS — R338 Other retention of urine: Secondary | ICD-10-CM | POA: Diagnosis not present

## 2020-03-29 DIAGNOSIS — Z8673 Personal history of transient ischemic attack (TIA), and cerebral infarction without residual deficits: Secondary | ICD-10-CM | POA: Diagnosis not present

## 2020-03-29 DIAGNOSIS — I680 Cerebral amyloid angiopathy: Secondary | ICD-10-CM

## 2020-03-29 LAB — RENAL FUNCTION PANEL
Albumin: 2.5 g/dL — ABNORMAL LOW (ref 3.5–5.0)
Anion gap: 10 (ref 5–15)
BUN: 36 mg/dL — ABNORMAL HIGH (ref 8–23)
CO2: 28 mmol/L (ref 22–32)
Calcium: 9.1 mg/dL (ref 8.9–10.3)
Chloride: 102 mmol/L (ref 98–111)
Creatinine, Ser: 1.24 mg/dL — ABNORMAL HIGH (ref 0.44–1.00)
GFR, Estimated: 43 mL/min — ABNORMAL LOW (ref >60)
Glucose, Bld: 197 mg/dL — ABNORMAL HIGH (ref 70–99)
Phosphorus: 4 mg/dL (ref 2.5–4.6)
Potassium: 4.8 mmol/L (ref 3.5–5.1)
Sodium: 140 mmol/L (ref 135–145)

## 2020-03-29 LAB — GLUCOSE, CAPILLARY
Glucose-Capillary: 195 mg/dL — ABNORMAL HIGH (ref 70–99)
Glucose-Capillary: 163 mg/dL — ABNORMAL HIGH (ref 70–99)

## 2020-03-29 MED ORDER — ZINC OXIDE 40 % EX OINT
TOPICAL_OINTMENT | Freq: Two times a day (BID) | CUTANEOUS | Status: DC
  Administered 2020-04-15 – 2020-04-23 (×3): 1 via TOPICAL
  Filled 2020-03-29 (×5): qty 57

## 2020-03-29 NOTE — Progress Notes (Signed)
Physical Therapy Treatment Patient Details Name: Hannah Fischer MRN: [id number] DOB: [birth year] Today's Date: 03/29/2020    History of Present Illness 87 y.o. female presenting code stroke. Imaging (+) for bilateral medial thalamic and punctate infarcts and L superior cerebellar infarcts. PMHx significant for multiple CVAs with most recent acute/subacute R frontal lobe and L parietal and occipital lobe infarcts ~3 months ago, residual L-sided hemiplegia with recent d/c from outpatient PT/OT with return to baseline level of function, TIA, DM, PE, anxiety, HTN, and HLD.    PT Comments    Pt tolerates treatment well, following commands more readily and responding more consistently to conversation. Pt continues to require significant physical assistance to perform all functional mobility but does provide activation of knee and hip extensors during standing attempts. Pt will benefit from continued aggressive mobilization and acute PT POC to improve mobility and reduce caregiver burden. PT recommends discharge to SNF.   Follow Up Recommendations  SNF;Supervision/Assistance - 24 hour     Equipment Recommendations  Wheelchair cushion (measurements PT);Wheelchair (measurements PT);Hospital bed (mechanical lift)    Recommendations for Other Services       Precautions / Restrictions Precautions Precautions: Fall Precaution Comments: cortrak, bilateral wrist restraints and mittens, nephrostomy drain. Restrictions Weight Bearing Restrictions: No    Mobility  Bed Mobility Overal bed mobility: Needs Assistance Bed Mobility: Supine to Sit;Sit to Supine     Supine to sit: Max assist;HOB elevated Sit to supine: Total assist   General bed mobility comments: Total A for all parts of bed mobility. Patient unable to assist despite max multimodal cues and palcement of L hand on bed rail.    Transfers Overall transfer level: Needs assistance Equipment used: 1 person hand held assist Transfers:  Sit to/from Stand Sit to Stand: Max assist         General transfer comment: PT provides L knee block and BUE support, pt is able to provide effort from LEs to extend knees and his, does extend cervical spine when verbally cued  Ambulation/Gait                 Stairs             Wheelchair Mobility    Modified Rankin (Stroke Patients Only) Modified Rankin (Stroke Patients Only) Pre-Morbid Rankin Score: No significant disability Modified Rankin: Severe disability     Balance Overall balance assessment: Needs assistance Sitting-balance support: Single extremity supported;Feet supported Sitting balance-Leahy Scale: Fair Sitting balance - Comments: minG with UE support of bed, often leaning onto R elbow Postural control: Right lateral lean Standing balance support: Bilateral upper extremity supported Standing balance-Leahy Scale: Poor Standing balance comment: maxA                            Cognition Arousal/Alertness: Awake/alert (pt often maintains eyes closed but does open to command and intermittently with mobility) Behavior During Therapy: Flat affect Overall Cognitive Status: Impaired/Different from baseline Area of Impairment: Memory;Following commands;Attention;Safety/judgement;Awareness;Problem solving                   Current Attention Level: Focused Memory: Decreased short-term memory;Decreased recall of precautions Following Commands: Follows one step commands with increased time Safety/Judgement: Decreased awareness of safety;Decreased awareness of deficits Awareness: Intellectual Problem Solving: Slow processing;Decreased initiation;Requires verbal cues;Requires tactile cues General Comments: Patient remains minimally responsive opening eyes briefly with transition to EOB. Lethargic throuhgout. Occasionally mumbles in response to sons questions.  Exercises      General Comments General comments (skin integrity, edema,  etc.): Son present and assisting during session. PT weans to room air with sats at 90% at end of mobility. PT returns pt to 2L [place] at end of session      Pertinent Vitals/Pain Pain Assessment: Faces Faces Pain Scale: Hurts a little bit Pain Location: generalized Pain Descriptors / Indicators: Grimacing Pain Intervention(s): Monitored during session    Home Living                      Prior Function            PT Goals (current goals can now be found in the care plan section) Acute Rehab PT Goals Patient Stated Goal: family goal to improve functional mobility Progress towards PT goals: Progressing toward goals (very slowly)    Frequency    Min 3X/week      PT Plan Current plan remains appropriate    Co-evaluation              AM-PAC PT "6 Clicks" Mobility   Outcome Measure  Help needed turning from your back to your side while in a flat bed without using bedrails?: A Lot Help needed moving from lying on your back to sitting on the side of a flat bed without using bedrails?: A Lot Help needed moving to and from a bed to a chair (including a wheelchair)?: A Lot Help needed standing up from a chair using your arms (e.g., wheelchair or bedside chair)?: Total Help needed to walk in hospital room?: Total Help needed climbing 3-5 steps with a railing? : Total 6 Click Score: 9    End of Session   Activity Tolerance: Patient tolerated treatment well Patient left: in bed;with call bell/phone within reach;with bed alarm set;with family/visitor present;with restraints reapplied;with SCD's reapplied Nurse Communication: Mobility status;Need for lift equipment PT Visit Diagnosis: Other symptoms and signs involving the nervous system (R29.898);Difficulty in walking, not elsewhere classified (R26.2);Unsteadiness on feet (R26.81);Muscle weakness (generalized) (M62.81)     Time: 6644-0347 PT Time Calculation (min) (ACUTE ONLY): 40 min  Charges:  $Therapeutic  Activity: 38-52 mins                     Arlyss Gandy, PT, DPT Acute Rehabilitation Pager: [phone]    Arlyss Gandy 03/29/2020, 5:07 PM

## 2020-03-29 NOTE — Progress Notes (Signed)
Occupational Therapy Treatment Patient Details Name: Hannah Fischer MRN: [id number] DOB: [birth year] Today's Date: 03/29/2020    History of present illness 87 y.o. female presenting code stroke. Imaging (+) for bilateral medial thalamic and punctate infarcts and L superior cerebellar infarcts. PMHx significant for multiple CVAs with most recent acute/subacute R frontal lobe and L parietal and occipital lobe infarcts ~3 months ago, residual L-sided hemiplegia with recent d/c from outpatient PT/OT with return to baseline level of function, TIA, DM, PE, anxiety, HTN, and HLD.   OT comments  Patient met lying supine in bed. Son present at bedside. Son attempting to arouse patient but to no avail. Education provided on speaking of fond memories between the patient and himself, playing her favorite music or turning on her favorite TV show. Patient son expressed verbal understanding. Patient requires assist for all parts of be mobility and sit to stand transfers and does not attempt to assist this date. Patient also does not follow 1-step commands consistently or attempt to assist with grooming tasks. Patient does attempt to communicate but speech in intelligible. Eyes remain closed throughout attempts to speak. Son present at bedside visibly saddened by his mothers condition. Given patient lack of progress toward goals, frequency decreased to 1x weekly and recommendation updated to [facility] vs. SNF pending patient progress. OT will continue to follow acutely.       Follow Up Recommendations  LTACH;Supervision/Assistance - 24 hour;SNF    Equipment Recommendations  Hospital bed    Recommendations for Other Services      Precautions / Restrictions Precautions Precautions: Fall Precaution Comments: cortrak, bilateral wrist restraints and mittens, nephrostomy drain. Restrictions Weight Bearing Restrictions: No       Mobility Bed Mobility Overal bed mobility: Needs Assistance Bed Mobility: Supine to  Sit;Sit to Supine     Supine to sit: Max assist;HOB elevated Sit to supine: Total assist   General bed mobility comments: Total A for all parts of bed mobility. Patient unable to assist despite max multimodal cues and palcement of L hand on bed rail.    Transfers Overall transfer level: Needs assistance Equipment used: 2 person hand held assist Transfers: Sit to/from Stand Sit to Stand: Total assist;+2 physical assistance;+2 safety/equipment         General transfer comment: Total A +2 for sit to stand from EOB this date. Eyes remain closed.    Balance Overall balance assessment: Needs assistance Sitting-balance support: Single extremity supported;Bilateral upper extremity supported;Feet supported Sitting balance-Leahy Scale: Poor Sitting balance - Comments: Patient able to maintain static sitting balance at EOB with Min A and brief periods of Min guard. Does not maintain position of head upright against gravity requiring external assist.   Standing balance support: Bilateral upper extremity supported Standing balance-Leahy Scale: Poor Standing balance comment: Total A +2 to maintain standing balance.                           ADL either performed or assessed with clinical judgement   ADL   Eating/Feeding: NPO (Cortrak)   Grooming: Total assistance;Sitting Grooming Details (indicate cue type and reason): Unable 2/2 level of arousal. Does not grip washcloth when prompted.             Lower Body Dressing: Total assistance;Bed level                 General ADL Comments: Patient limited by decreased level of arousal requirng Total A grossly for bed  mobiltiy and observed ADLs.     Vision       Perception     Praxis      Cognition Arousal/Alertness: Lethargic Behavior During Therapy: Flat affect Overall Cognitive Status: Impaired/Different from baseline Area of Impairment: Attention;Memory;Following commands;Safety/judgement;Awareness;Problem  solving                   Current Attention Level: Focused Memory: Decreased short-term memory Following Commands: Follows one step commands inconsistently;Follows one step commands with increased time Safety/Judgement: Decreased awareness of safety;Decreased awareness of deficits Awareness: Intellectual Problem Solving: Slow processing General Comments: Patient remains minimally responsive opening eyes briefly with transition to EOB. Lethargic throuhgout. Occasionally mumbles in response to sons questions.        Exercises     Shoulder Instructions       General Comments Son present at bedside.    Pertinent Vitals/ Pain       Pain Assessment: Faces Faces Pain Scale: No hurt Pain Intervention(s): Monitored during session  Home Living                                          Prior Functioning/Environment              Frequency  Min 1X/week (Frequency decreased to 1x weekly)        Progress Toward Goals  OT Goals(current goals can now be found in the care plan section)  Progress towards OT goals: Not progressing toward goals - comment (Patient remains lethargic with inability to consistently follow commands.)  Acute Rehab OT Goals Patient Stated Goal: Unable to state. OT Goal Formulation: With family Time For Goal Achievement: 04/05/20 Potential to Achieve Goals: Fair ADL Goals Additional ADL Goal #1: Patient will keep eyes open for 50% of OT treatment session demonstrating increased alertness and level of arousal. Additional ADL Goal #2: Patient will maintain static sitting balance at EOB with no more than Min A in prep for seated ADLs. Additional ADL Goal #3: Patient will attend to 1 grooming task >1-3 min with Mod cues and hand over hand assist.  Plan Discharge plan remains appropriate;Frequency needs to be updated    Co-evaluation                 AM-PAC OT "6 Clicks" Daily Activity     Outcome Measure   Help from  another person eating meals?: Total Help from another person taking care of personal grooming?: Total Help from another person toileting, which includes using toliet, bedpan, or urinal?: Total Help from another person bathing (including washing, rinsing, drying)?: Total Help from another person to put on and taking off regular upper body clothing?: Total Help from another person to put on and taking off regular lower body clothing?: Total 6 Click Score: 6    End of Session Equipment Utilized During Treatment: Gait belt  OT Visit Diagnosis: Unsteadiness on feet (R26.81);Other abnormalities of gait and mobility (R26.89);Muscle weakness (generalized) (M62.81);Hemiplegia and hemiparesis Hemiplegia - caused by: Cerebral infarction   Activity Tolerance Patient limited by lethargy   Patient Left in bed;with call bell/phone within reach;with bed alarm set;with restraints reapplied;with family/visitor present   Nurse Communication          Time: 3235-5732 OT Time Calculation (min): 33 min  Charges: OT General Charges $OT Visit: 1 Visit OT Treatments $Self Care/Home Management : 8-22 mins $Therapeutic Activity: 8-22  mins  Destanae H. OTR/L Supplemental OT, Department of rehab services [phone]   Destanae R H. 03/29/2020, 3:22 PM

## 2020-03-29 NOTE — Progress Notes (Signed)
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  At that she had gone to bed last night and in the morning he had trouble waking her up. In the ED she was noted to be responsive to pain.  A code stroke was called.  She was outside the window for TPA.  An emergent CT did not show any acute abnormality.  A CTA was performed and did not reveal any obvious stenosis. An MRI of the brain was performed on 2/13:Punctate acute infarction in the left superior cerebellumadjacent to the superior cerebellar peduncle. Bilateral thalamic acute infarction extending back into the mid brain. This is consistent with artery of [place] infarction.  Subjective: No change in mental status- she has trouble staying awake and does not answer questions.   Assessment & Plan:   Principal Problem:   Acute encephalopathy secondary to bilateral thalamic infarcts -Prior history of embolic infarcts -The patient currently has an NG tube  -Neurology and the stroke team have seen her -Started on amantadine and Ritalin to help improve her lethargy- she is now more easy to awaken and does follow commands and try to communicate but continues to sleep most of the day and prefers to keep eyes closed -Due to her lethargy, she has a cor track and is receiving Osmolite -Palliative care has been consulted and the family would like to continue aggressive care but does not want CPR or shocks and thus she is a DNR -I appreciate palliative care following and speaking with family about the goals of care- The patient's husband will discuss further plans for PEG vs comfort care with his children. We will wait at least until Thursday or  Friday to see if she will awaken (after nephrostomy placed and renal function improves). - 2/22> more awake and slightly agitated but not awake enough to adequately communicate or eat/drink  Active Problems: Acute metabolic encephalopathy - waking up from her brain injury and agitated- have asked for a sitter - as sitter not available, she is in soft wrist restrains for now as she pulled out her IV.  CKD 3A- AKI due to dehydration -Baseline creatinine appears to be 1.0-1.3 -started free water per tube and watching I & O closely to prevent heart failure exacerbation - BUN & Cr continued to rise despite adequate hydration - 2/19>-  renal ultrasound showed right sided hydronephrosis and nephrolithiasis with a UPJ stone - have consulted nephrology and urology-  -  IR placed a right nephrostomy tube on 2/21- renal function now improving   Grade 2 diastolic heart failure-chronic -2D echo during this admission reveals grade 2 diastolic dysfunction - following fluid status closely  Gluteal skin breakdown - have ordered [place] and barrier cream - will switch to an air mattress to prevent further skin breakdown- Due to NG tube and risk for aspiration, it is difficult to turn her in the bed Q 4 hrs     Diabetes mellitus 2 -Continue on insulin sliding scale every 4 hours while she is on tube feeds Component Value Date/Time   HGBA1C 7.6 (H) 03/18/2020 1936      Hypothyroidism -Continue Synthroid per tube    Time spent in minutes: 35 DVT prophylaxis: Heparin Code Status:  DO NOT RESUSCITATE Family Communication: Husband-  Level of Care: Level of care: Progressive Disposition Plan:  Status is: Inpatient  Remains inpatient appropriate because:IV treatments appropriate due to intensity of illness or inability to take PO   Dispo: The patient is from: Home              Anticipated d/c is to: To be determined               Anticipated d/c date is: > 3 days              Patient currently  is not medically stable to d/c.   Difficult to place patient No   Consultants:   Neurology  Urology  Nephrology Procedures:   Cor trac Antimicrobials:  Anti-infectives (From admission, onward)   Start     Dose/Rate Route Frequency Ordered Stop   03/26/20 0730  cefTRIAXone (ROCEPHIN) 1 g in sodium chloride 0.9 % 100 mL IVPB        1 g 200 mL/hr over 30 Minutes Intravenous Every 24 hours 03/26/20 0644 03/31/20 0729   03/18/20 1900  azithromycin (ZITHROMAX) 500 mg in sodium chloride 0.9 % 250 mL IVPB  Status:  Discontinued        500 mg 250 mL/hr over 60 Minutes Intravenous Every 24 hours 03/18/20 1830 03/19/20 1106   03/18/20 1900  cefTRIAXone (ROCEPHIN) 2 g in sodium chloride 0.9 % 100 mL IVPB  Status:  Discontinued        2 g 200 mL/hr over 30 Minutes Intravenous Every 24 hours 03/18/20 1830 03/19/20 1106       Objective: Vitals:   03/28/20 2341 03/29/20 0338 03/29/20 0342 03/29/20 0818  BP: (!) 148/69 140/72  (!) 156/71  Pulse: 70 69  94  Resp: 18 18  18   Temp: 99.2 F (37.3 C) 98.6 F (37 C)  98.3 F (36.8 C)  TempSrc: Oral Oral  Axillary  SpO2: 99% 98%  100%  Weight:   107.9 kg   Height:        Intake/Output Summary (Last 24 hours) at 03/29/2020 0956 Last data filed at 03/29/2020 0427 Gross per 24 hour  Intake 1140 ml  Output 1800 ml  Net -660 ml   Filed Weights   03/25/20 0500 03/28/20 0500 03/29/20 0342  Weight: 104.6 kg 108.2 kg 107.9 kg    Examination: General exam: Appears comfortable  HEENT: PERRLA, oral mucosa moist, no sclera icterus or thrush- cor trac present Respiratory system: Clear to auscultation. Respiratory effort normal. Cardiovascular system: S1 & S2 heard, regular rate and rhythm Gastrointestinal system: Abdomen soft, non-tender, nondistended. Normal bowel sounds   Central nervous system: sleepy, will not open eyes or follow commands Extremities: No cyanosis, clubbing or edema Skin: severe erythema of buttocks  Psychiatry:  cannot  assess  Data Reviewed: I have personally reviewed following labs and imaging studies  CBC: Recent Labs  Lab 03/25/20 0648 03/27/20 1009  WBC 6.5 5.8  HGB 11.2* 11.7*  HCT 34.8* 35.9*  MCV 98.9 101.1*  PLT 208 222   Basic Metabolic Panel: Recent Labs  Lab 03/25/20 0648 03/26/20 0726 03/27/20 0225 03/28/20 0325 03/29/20 0407  NA 136 135 138 142 140  K 4.8 5.2* 5.2* 4.5 4.8  CL 102 97* 100 102 102  CO2 24 27 26 26 28   GLUCOSE 297* 187* 155* 178* 197*  BUN 43* 55* 61* 51* 36*  CREATININE 2.15* 2.67* 2.52* 1.77* 1.24*  CALCIUM 9.7 9.2 9.2 9.3 9.1  PHOS  --  5.7* 6.8* 5.0* 4.0   GFR: Estimated Creatinine Clearance: 39.1 mL/min (A) (by C-G formula based on SCr of 1.24 mg/dL (H)). Liver Function Tests: Recent Labs  Lab 03/26/20 0726 03/27/20 0225 03/28/20 0325 03/29/20 0407  ALBUMIN 2.5* 2.5* 2.6* 2.5*   No results for input(s): LIPASE, AMYLASE in the last 168 hours. No results for input(s): AMMONIA in the last 168 hours. Coagulation Profile: Recent Labs  Lab 03/27/20 1009  INR 1.0   Cardiac Enzymes: No results for input(s): CKTOTAL, CKMB, CKMBINDEX, TROPONINI in the last 168 hours. BNP (last 3 results) No results for input(s): PROBNP in the last 8760 hours. HbA1C: No results for input(s): HGBA1C in the last 72 hours. CBG: Recent Labs  Lab 03/28/20 1548 03/28/20 2010 03/28/20 2339 03/29/20 0337 03/29/20 0757  GLUCAP 193* 156* 181* 195* 163*   Lipid Profile: No results for input(s): CHOL, HDL, LDLCALC, TRIG, CHOLHDL, LDLDIRECT in the last 72 hours. Thyroid Function Tests: No results for input(s): TSH, T4TOTAL, FREET4, T3FREE, THYROIDAB in the last 72 hours. Anemia Panel: Recent Labs    03/28/20 0900  VITAMINB12 266  FOLATE 16.1  FERRITIN 129  TIBC 316  IRON 56  RETICCTPCT 2.2   Urine analysis:    Component Value Date/Time   COLORURINE YELLOW 03/25/2020 1614   APPEARANCEUR CLOUDY (A) 03/25/2020 1614   LABSPEC 1.010 03/25/2020 1614    PHURINE 5.0 03/25/2020 1614   GLUCOSEU 50 (A) 03/25/2020 1614   HGBUR SMALL (A) 03/25/2020 1614   BILIRUBINUR NEGATIVE 03/25/2020 1614   KETONESUR NEGATIVE 03/25/2020 1614   PROTEINUR NEGATIVE 03/25/2020 1614   UROBILINOGEN 0.2 11/22/2013 0945   NITRITE NEGATIVE 03/25/2020 1614   LEUKOCYTESUR LARGE (A) 03/25/2020 1614   Sepsis Labs: @LABRCNTIP (procalcitonin:4,lacticidven:4) ) Recent Results (from the past 240 hour(s))  Culture, Urine     Status: Abnormal   Collection Time: 03/26/20  6:44 AM   Specimen: Urine, Random  Result Value Ref Range Status   Specimen Description URINE, RANDOM  Final   Special Requests   Final    NONE Performed at [facility], [address], [place], [place]    Culture >=100,000 COLONIES/mL YEAST (A)  Final   Report Status 03/27/2020 FINAL  Final         Radiology Studies: IR 03/29/2020 Guidance  Result Date: 03/27/2020 INDICATION: 87 year old female with right hydronephrosis and azotemia. EXAM: 1. ULTRASOUND GUIDANCE FOR PUNCTURE OF THE RIGHT RENAL COLLECTING SYSTEM 2. RIGHT PERCUTANEOUS NEPHROSTOMY TUBE PLACEMENT. COMPARISON:  03/25/2020, 03/27/2020 MEDICATIONS: None. ANESTHESIA/SEDATION: Moderate (conscious) sedation was employed during this procedure. A total of Versed 1 mg and Fentanyl 0 mcg was administered intravenously. Moderate Sedation Time: 17 minutes. The patient's level of consciousness and vital signs were monitored continuously by radiology nursing throughout the procedure under my direct supervision. CONTRAST:  10 mL mL Omnipaque 300-administered into the renal collecting system FLUOROSCOPY TIME:  One minutes 30 seconds, 9 mGy COMPLICATIONS: None immediate. PROCEDURE: The procedure, risks, benefits, and alternatives were explained to the patient. Questions regarding the procedure were encouraged and answered. The patient understands and consents to the procedure. A timeout was performed prior to the initiation of the procedure. The  right flank region was prepped and draped in the usual sterile fashion and a sterile drape was applied covering the operative field. A sterile gown and sterile gloves were used for the procedure. Local anesthesia was provided with 1% Lidocaine with epinephrine. Ultrasound was used to localize the right kidney. Under  direct ultrasound guidance, a 20 gauge needle was advanced into the renal collecting system. An ultrasound image documentation was performed. Access within the collecting system was confirmed with the efflux of urine followed by limited contrast injection. Over a Nitrex wire, the tract was dilated with an Accustick stent. Next, under intermittent fluoroscopic guidance and over a short Amplatz wire, the track was dilated ultimately allowing placement of a 10.2-French percutaneous nephrostomy catheter which was advanced to the level of the renal pelvis where the coil was formed and locked. Contrast was injected and several spot fluoroscopic images were obtained in various obliquities. The catheter was secured at the skin with a Prolene retention suture and stat lock device and connected to a gravity bag was placed. Dressings were applied. The patient tolerated procedure well without immediate postprocedural complication. FINDINGS: Ultrasound scanning demonstrates a moderate to severely dilated right collecting system. There is an apparent ureteropelvic junction obstruction. Under a combination of ultrasound and fluoroscopic guidance, a posterior inferior calix was targeted allowing placement of a 10.2 French percutaneous nephrostomy catheter with end coiled and locked within the renal pelvis. Contrast injection confirmed appropriate positioning. IMPRESSION: Successful ultrasound and fluoroscopic guided placement of a right sided 10.2 [place] PCN. Limited nephrostogram demonstrates severe hydronephrosis and ureteropelvic junction obstruction. PLAN: Return in 4 weeks for routine check and exchange pending  interim definitive Urological intervention. Marliss Coots, MD [facility] Radiology Electronically Signed   By: Marliss Coots MD   On: 03/27/2020 14:46   IR NEPHROSTOMY PLACEMENT RIGHT  Result Date: 03/27/2020 INDICATION: 87 year old female with right hydronephrosis and azotemia. EXAM: 1. ULTRASOUND GUIDANCE FOR PUNCTURE OF THE RIGHT RENAL COLLECTING SYSTEM 2. RIGHT PERCUTANEOUS NEPHROSTOMY TUBE PLACEMENT. COMPARISON:  03/25/2020, 03/27/2020 MEDICATIONS: None. ANESTHESIA/SEDATION: Moderate (conscious) sedation was employed during this procedure. A total of Versed 1 mg and Fentanyl 0 mcg was administered intravenously. Moderate Sedation Time: 17 minutes. The patient's level of consciousness and vital signs were monitored continuously by radiology nursing throughout the procedure under my direct supervision. CONTRAST:  10 mL mL Omnipaque 300-administered into the renal collecting system FLUOROSCOPY TIME:  One minutes 30 seconds, 9 mGy COMPLICATIONS: None immediate. PROCEDURE: The procedure, risks, benefits, and alternatives were explained to the patient. Questions regarding the procedure were encouraged and answered. The patient understands and consents to the procedure. A timeout was performed prior to the initiation of the procedure. The right flank region was prepped and draped in the usual sterile fashion and a sterile drape was applied covering the operative field. A sterile gown and sterile gloves were used for the procedure. Local anesthesia was provided with 1% Lidocaine with epinephrine. Ultrasound was used to localize the right kidney. Under direct ultrasound guidance, a 20 gauge needle was advanced into the renal collecting system. An ultrasound image documentation was performed. Access within the collecting system was confirmed with the efflux of urine followed by limited contrast injection. Over a Nitrex wire, the tract was dilated with an Accustick  stent. Next, under intermittent fluoroscopic guidance and over a short Amplatz wire, the track was dilated ultimately allowing placement of a 10.2-French percutaneous nephrostomy catheter which was advanced to the level of the renal pelvis where the coil was formed and locked. Contrast was injected and several spot fluoroscopic images were obtained in various obliquities. The catheter was secured at the skin with a Prolene retention suture and stat lock device and connected to a gravity bag was placed. Dressings were applied. The patient tolerated procedure well without  immediate postprocedural complication. FINDINGS: Ultrasound scanning demonstrates a moderate to severely dilated right collecting system. There is an apparent ureteropelvic junction obstruction. Under a combination of ultrasound and fluoroscopic guidance, a posterior inferior calix was targeted allowing placement of a 10.2 French percutaneous nephrostomy catheter with end coiled and locked within the renal pelvis. Contrast injection confirmed appropriate positioning. IMPRESSION: Successful ultrasound and fluoroscopic guided placement of a right sided 10.2 [place] PCN. Limited nephrostogram demonstrates severe hydronephrosis and ureteropelvic junction obstruction. PLAN: Return in 4 weeks for routine check and exchange pending interim definitive Urological intervention. Marliss Cootsylan Suttle, MD [facility] Electronically Signed   By: Marliss Cootsylan  Suttle MD   On: 03/27/2020 14:46      Scheduled Meds: . amantadine  50 mg Per Tube Daily  . amLODipine  5 mg Per Tube Daily  . chlorhexidine  15 mL Mouth Rinse BID  . Chlorhexidine Gluconate Cloth  6 each Topical Daily  . clopidogrel  75 mg Per Tube Q breakfast  . feeding supplement (PROSource TF)  45 mL Per Tube TID  . free water  200 mL Per Tube Q6H  . heparin  5,000 Units Subcutaneous Q8H  . levothyroxine  112 mcg Per Tube QAC breakfast  . liver  oil-zinc oxide   Topical BID  . mouth rinse  15 mL Mouth Rinse q12n4p  . sodium chloride flush  3 mL Intravenous Once   Continuous Infusions: . sodium chloride 10 mL/hr at 03/24/20 0305  . cefTRIAXone (ROCEPHIN)  IV 1 g (03/29/20 0747)  . feeding supplement (OSMOLITE 1.2 CAL) 1,000 mL (03/28/20 0615)     LOS: 11 days      Calvert CantorSaima Rafay Dahan, MD [facility] Pager: [email or web address] 03/29/2020, 9:56 AM

## 2020-03-29 NOTE — Progress Notes (Signed)
Referring Physician(s): Dr. Marlou PorchHerrick  Supervising Physician: Mir, Mauri ReadingFarhaan  Patient Status:  [facility]  Chief Complaint: Hydronephrosis  Subjective: Remains encephalopathic.  Opens eyes to stimulation, but little to no interaction.  Rolls easily to assess PCN insertion site.  Bloody urine collection bag, yellow urine in foley.   Allergies: Atorvastatin, Ezetimibe-simvastatin, Lovastatin, Metformin hcl, Pravastatin, Codeine, Simvastatin, Sulfa antibiotics, and Sulfites  Medications: Prior to Admission medications   Medication Sig Start Date End Date Taking? Authorizing Provider  amLODipine (NORVASC) 5 MG tablet Take 5 mg by mouth every morning. 12/22/19  Yes [provider]  B Complex Vitamins (VITAMIN B COMPLEX) TABS Take 1 tablet by mouth daily after breakfast.   Yes [provider]  Cholecalciferol (VITAMIN D3) 2000 UNITS TABS Take 2,000 Units by mouth daily.    Yes [provider]  clopidogrel (PLAVIX) 75 MG tablet Take 1 tablet (75 mg total) by mouth daily with breakfast. 11/17/11  Yes Vassie LollMadera, Carlos, MD  DULoxetine (CYMBALTA) 30 MG capsule TAKE 1 CAPSULE(30 MG) BY MOUTH DAILY Patient taking differently: Take 30 mg by mouth daily. 08/25/19  Yes Marcello FennelPatel, Ankit Anil, MD  levothyroxine (SYNTHROID) 112 MCG tablet Take 112 mcg by mouth daily before breakfast.   Yes [provider]  losartan (COZAAR) 100 MG tablet Take 100 mg by mouth daily. 04/29/19  Yes [provider]  nitrofurantoin (MACRODANTIN) 100 MG capsule Take 100 mg by mouth daily. 03/09/20  Yes [provider]  Omega-3 Fatty Acids (FISH OIL) 1000 MG CAPS Take 1,000 mg by mouth 2 (two) times daily.   Yes [provider]  pioglitazone (ACTOS) 45 MG tablet Take 45 mg by mouth daily.   Yes [provider]  propranolol (INDERAL) 40 MG tablet Take 40 mg by mouth daily.  04/13/17  Yes [provider]  traZODone (DESYREL) 50 MG tablet TAKE 1 TABLET BY  MOUTH AT BEDTIME Patient taking differently: Take 50 mg by mouth at bedtime. 07/01/17  Yes Patel, Maryln GottronAnkit Anil, MD  levothyroxine (SYNTHROID, LEVOTHROID) 100 MCG tablet Take 1 tablet (100 mcg total) by mouth daily. Patient not taking: No sig reported 11/17/11   Vassie LollMadera, Carlos, MD  methocarbamol (ROBAXIN) 500 MG tablet Take 1 tablet (500 mg total) by mouth every 8 (eight) hours as needed for muscle spasms. Patient not taking: No sig reported 04/13/19   Marcello FennelPatel, Ankit Anil, MD  rosuvastatin (CRESTOR) 5 MG tablet Take 1 tablet (5 mg total) by mouth 2 (two) times a week for 30 doses. 11/29/19 03/10/20  Azucena FallenLancaster, William C, MD     Vital Signs: BP (!) 159/73 (BP Location: Right Arm)    Pulse 74    Temp 98.4 F (36.9 C) (Axillary)    Resp 16    Ht 5\' 3"  (1.6 m)    Wt 237 lb 14 oz (107.9 kg)    SpO2 99%    BMI 42.14 kg/m   Physical Exam  NAD, alert Back: Right PCN insertion site intact,clean, and dry.  Bloody urine in collection bag. Yellow urin in foley.  Imaging: US RENAL  Result Date: 03/27/2020 CLINICAL DATA:  Hydronephrosis, diabetes mellitus, hypertension, history of renal failure EXAM: RENAL / URINARY TRACT ULTRASOUND COMPLETE COMPARISON:  03/25/2020 renal ultrasound and CT exams FINDINGS: Right Kidney: Renal measurements: 10.5 x 5.3 x 6.2 cm = volume: 181 mL. Cortical thinning. Normal cortical echogenicity. Severe hydronephrosis again identified. No mass or definite calcification identified. Left Kidney: Renal measurements: 9.6 x 4.5 x 4.2 cm =  volume: 94 mL. Cortical thinning. Upper normal cortical echogenicity. No mass, hydronephrosis, or shadowing calcification. Bladder: Decompressed by Foley catheter, unable to evaluate. Other: N/A IMPRESSION: Persistent severe RIGHT hydronephrosis. BILATERAL renal cortical thinning. Electronically Signed   By: Ulyses Southward M.D.   On: 03/27/2020 08:17   IR US Guidance  Result Date: 03/27/2020 INDICATION: 87 year old female with right hydronephrosis and azotemia.  EXAM: 1. ULTRASOUND GUIDANCE FOR PUNCTURE OF THE RIGHT RENAL COLLECTING SYSTEM 2. RIGHT PERCUTANEOUS NEPHROSTOMY TUBE PLACEMENT. COMPARISON:  03/25/2020, 03/27/2020 MEDICATIONS: None. ANESTHESIA/SEDATION: Moderate (conscious) sedation was employed during this procedure. A total of Versed 1 mg and Fentanyl 0 mcg was administered intravenously. Moderate Sedation Time: 17 minutes. The patient's level of consciousness and vital signs were monitored continuously by radiology nursing throughout the procedure under my direct supervision. CONTRAST:  10 mL mL Omnipaque 300-administered into the renal collecting system FLUOROSCOPY TIME:  One minutes 30 seconds, 9 mGy COMPLICATIONS: None immediate. PROCEDURE: The procedure, risks, benefits, and alternatives were explained to the patient. Questions regarding the procedure were encouraged and answered. The patient understands and consents to the procedure. A timeout was performed prior to the initiation of the procedure. The right flank region was prepped and draped in the usual sterile fashion and a sterile drape was applied covering the operative field. A sterile gown and sterile gloves were used for the procedure. Local anesthesia was provided with 1% Lidocaine with epinephrine. Ultrasound was used to localize the right kidney. Under direct ultrasound guidance, a 20 gauge needle was advanced into the renal collecting system. An ultrasound image documentation was performed. Access within the collecting system was confirmed with the efflux of urine followed by limited contrast injection. Over a Nitrex wire, the tract was dilated with an Accustick stent. Next, under intermittent fluoroscopic guidance and over a short Amplatz wire, the track was dilated ultimately allowing placement of a 10.2-French percutaneous nephrostomy catheter which was advanced to the level of the renal pelvis where the coil was formed and locked. Contrast was injected and several spot fluoroscopic images  were obtained in various obliquities. The catheter was secured at the skin with a Prolene retention suture and stat lock device and connected to a gravity bag was placed. Dressings were applied. The patient tolerated procedure well without immediate postprocedural complication. FINDINGS: Ultrasound scanning demonstrates a moderate to severely dilated right collecting system. There is an apparent ureteropelvic junction obstruction. Under a combination of ultrasound and fluoroscopic guidance, a posterior inferior calix was targeted allowing placement of a 10.2 French percutaneous nephrostomy catheter with end coiled and locked within the renal pelvis. Contrast injection confirmed appropriate positioning. IMPRESSION: Successful ultrasound and fluoroscopic guided placement of a right sided 10.2 [place] PCN. Limited nephrostogram demonstrates severe hydronephrosis and ureteropelvic junction obstruction. PLAN: Return in 4 weeks for routine check and exchange pending interim definitive Urological intervention. Marliss Coots, MD [facility] Radiology Electronically Signed   By: Marliss Coots MD   On: 03/27/2020 14:46   IR NEPHROSTOMY PLACEMENT RIGHT  Result Date: 03/27/2020 INDICATION: 87 year old female with right hydronephrosis and azotemia. EXAM: 1. ULTRASOUND GUIDANCE FOR PUNCTURE OF THE RIGHT RENAL COLLECTING SYSTEM 2. RIGHT PERCUTANEOUS NEPHROSTOMY TUBE PLACEMENT. COMPARISON:  03/25/2020, 03/27/2020 MEDICATIONS: None. ANESTHESIA/SEDATION: Moderate (conscious) sedation was employed during this procedure. A total of Versed 1 mg and Fentanyl 0 mcg was administered intravenously. Moderate Sedation Time: 17 minutes. The patient's level of consciousness and vital signs were monitored continuously by radiology nursing throughout the procedure under my direct  supervision. CONTRAST:  10 mL mL Omnipaque 300-administered into the renal collecting system FLUOROSCOPY TIME:  One  minutes 30 seconds, 9 mGy COMPLICATIONS: None immediate. PROCEDURE: The procedure, risks, benefits, and alternatives were explained to the patient. Questions regarding the procedure were encouraged and answered. The patient understands and consents to the procedure. A timeout was performed prior to the initiation of the procedure. The right flank region was prepped and draped in the usual sterile fashion and a sterile drape was applied covering the operative field. A sterile gown and sterile gloves were used for the procedure. Local anesthesia was provided with 1% Lidocaine with epinephrine. Ultrasound was used to localize the right kidney. Under direct ultrasound guidance, a 20 gauge needle was advanced into the renal collecting system. An ultrasound image documentation was performed. Access within the collecting system was confirmed with the efflux of urine followed by limited contrast injection. Over a Nitrex wire, the tract was dilated with an Accustick stent. Next, under intermittent fluoroscopic guidance and over a short Amplatz wire, the track was dilated ultimately allowing placement of a 10.2-French percutaneous nephrostomy catheter which was advanced to the level of the renal pelvis where the coil was formed and locked. Contrast was injected and several spot fluoroscopic images were obtained in various obliquities. The catheter was secured at the skin with a Prolene retention suture and stat lock device and connected to a gravity bag was placed. Dressings were applied. The patient tolerated procedure well without immediate postprocedural complication. FINDINGS: Ultrasound scanning demonstrates a moderate to severely dilated right collecting system. There is an apparent ureteropelvic junction obstruction. Under a combination of ultrasound and fluoroscopic guidance, a posterior inferior calix was targeted allowing placement of a 10.2 French percutaneous nephrostomy catheter with end coiled and locked within  the renal pelvis. Contrast injection confirmed appropriate positioning. IMPRESSION: Successful ultrasound and fluoroscopic guided placement of a right sided 10.2 [place] PCN. Limited nephrostogram demonstrates severe hydronephrosis and ureteropelvic junction obstruction. PLAN: Return in 4 weeks for routine check and exchange pending interim definitive Urological intervention. Marliss Coots, MD [facility] Radiology Electronically Signed   By: Marliss Coots MD   On: 03/27/2020 14:46   CT CHEST ABDOMEN PELVIS WO CONTRAST  Result Date: 03/25/2020 CLINICAL DATA:  Weakness.  Acute kidney injury.  Hydronephrosis. EXAM: CT CHEST, ABDOMEN AND PELVIS WITHOUT CONTRAST TECHNIQUE: Multidetector CT imaging of the chest, abdomen and pelvis was performed following the standard protocol without IV contrast. COMPARISON:  January 19, 2020 FINDINGS: CT CHEST FINDINGS Cardiovascular: The heart size is unremarkable. There are coronary artery calcifications. There are atherosclerotic changes of the thoracic aorta without evidence for an aneurysm. There is a trace pericardial effusion. Mediastinum/Nodes: -- No mediastinal lymphadenopathy. -- No hilar lymphadenopathy. -- No axillary lymphadenopathy. -- No supraclavicular lymphadenopathy. -- Normal thyroid gland where visualized. -the enteric feeding tube terminates near the GE junction. Repositioning is recommended. Lungs/Pleura: There is scarring versus atelectasis at the lung bases. The trachea is unremarkable. There is no pneumothorax or large pleural effusion. Musculoskeletal: No chest wall abnormality. No bony spinal canal stenosis. CT ABDOMEN PELVIS FINDINGS Hepatobiliary: The liver is normal. Status post cholecystectomy.There is no biliary ductal dilation. Pancreas: Normal contours without ductal dilatation. No peripancreatic fluid collection. Spleen: Unremarkable. Adrenals/Urinary Tract: --Adrenal glands: Unremarkable. --Right  kidney/ureter: There is severe right-sided hydronephrosis which is new since January 19, 2020. There is a nonobstructing stone in the renal pelvis. The level of obstruction appears to be at the right UPJ. --Left  kidney/ureter: There is mild left-sided hydroureteronephrosis without definite evidence for an obstructing stone. There is a punctate nonobstructing stone in the lower pole the left kidney. --Urinary bladder: The urinary bladder is significantly distended despite Foley catheter placement. Stomach/Bowel: --Stomach/Duodenum: No hiatal hernia or other gastric abnormality. Normal duodenal course and caliber. --Small bowel: Unremarkable. --Colon: Rectosigmoid diverticulosis without acute inflammation. --Appendix: Normal. Vascular/Lymphatic: Atherosclerotic calcification is present within the non-aneurysmal abdominal aorta, without hemodynamically significant stenosis. --No retroperitoneal lymphadenopathy. --No mesenteric lymphadenopathy. --No pelvic or inguinal lymphadenopathy. Reproductive: Unremarkable Other: There is a small volume of free fluid the patient's pelvis. The abdominal wall is normal. Musculoskeletal. Moderate degenerative changes are noted throughout the visualized thoracolumbar spine. There is diffuse osteopenia. No definite acute displaced fracture. IMPRESSION: 1. Severe right-sided hydronephrosis, new since January 19, 2020. 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 There is no obstructing stone visualized on this study. 2. Mild left-sided hydroureteronephrosis without definite evidence for an obstructing stone. 3. The urinary bladder is significantly distended despite Foley catheter placement. Query catheter malfunction. 4. Enteric feeding tube terminates near the GE junction. Repositioning is recommended. 5. Rectosigmoid diverticulosis without acute inflammation. 6. Small volume of free fluid the patient's pelvis. Aortic Atherosclerosis (ICD10-I70.0). Electronically Signed    By: Katherine Mantle M.D.   On: 03/25/2020 15:53    Labs:  CBC: Recent Labs    03/20/20 0504 03/21/20 0534 03/25/20 0648 03/27/20 1009  WBC 6.2 5.6 6.5 5.8  HGB 13.4 13.0 11.2* 11.7*  HCT 41.5 38.8 34.8* 35.9*  PLT 186 187 208 222    COAGS: Recent Labs    11/23/19 2304 03/18/20 1420 03/27/20 1009  INR 0.9 1.0 1.0  APTT 27 26  --     BMP: Recent Labs    03/26/20 0726 03/27/20 0225 03/28/20 0325 03/29/20 0407  NA 135 138 142 140  K 5.2* 5.2* 4.5 4.8  CL 97* 100 102 102  CO2 27 26 26 28   GLUCOSE 187* 155* 178* 197*  BUN 55* 61* 51* 36*  CALCIUM 9.2 9.2 9.3 9.1  CREATININE 2.67* 2.52* 1.77* 1.24*  GFRNONAA 17* 18* 28* 43*    LIVER FUNCTION TESTS: Recent Labs    11/23/19 2304 03/18/20 1420 03/26/20 0726 03/27/20 0225 03/28/20 0325 03/29/20 0407  BILITOT 0.9 0.6  --   --   --   --   AST 21 20  --   --   --   --   ALT 9 11  --   --   --   --   ALKPHOS 73 56  --   --   --   --   PROT 6.8 5.5*  --   --   --   --   ALBUMIN 3.7 2.9* 2.5* 2.5* 2.6* 2.5*    Assessment and Plan: Right hydronephrosis s/p R PCN placement by Dr. 03/31/20 Patient encephalopathic, non-contributory to exam today. In restraints. No issues identified with R PCN today. Site intact, good output.  SCr improved to 1.24 since placement.  Continue current management.  IR to follow.    Electronically Signed: Elby Showers, PA 03/29/2020, 3:19 PM   I spent a total of 15 Minutes at the the patient's bedside AND on the patient's hospital floor or unit, greater than 50% of which was counseling/coordinating care for hydronephrosis.

## 2020-03-30 ENCOUNTER — Inpatient Hospital Stay (HOSPITAL_COMMUNITY): Payer: Medicare Other

## 2020-03-30 DIAGNOSIS — R338 Other retention of urine: Secondary | ICD-10-CM | POA: Diagnosis not present

## 2020-03-30 DIAGNOSIS — N179 Acute kidney failure, unspecified: Secondary | ICD-10-CM | POA: Diagnosis not present

## 2020-03-30 DIAGNOSIS — Z8673 Personal history of transient ischemic attack (TIA), and cerebral infarction without residual deficits: Secondary | ICD-10-CM | POA: Diagnosis not present

## 2020-03-30 DIAGNOSIS — G9340 Encephalopathy, unspecified: Secondary | ICD-10-CM | POA: Diagnosis not present

## 2020-03-30 LAB — RENAL FUNCTION PANEL
Albumin: 2.8 g/dL — ABNORMAL LOW (ref 3.5–5.0)
Anion gap: 11 (ref 5–15)
BUN: 33 mg/dL — ABNORMAL HIGH (ref 8–23)
CO2: 26 mmol/L (ref 22–32)
Calcium: 9.6 mg/dL (ref 8.9–10.3)
Chloride: 102 mmol/L (ref 98–111)
Creatinine, Ser: 1.15 mg/dL — ABNORMAL HIGH (ref 0.44–1.00)
GFR, Estimated: 47 mL/min — ABNORMAL LOW (ref >60)
Glucose, Bld: 269 mg/dL — ABNORMAL HIGH (ref 70–99)
Phosphorus: 3.9 mg/dL (ref 2.5–4.6)
Potassium: 4.7 mmol/L (ref 3.5–5.1)
Sodium: 139 mmol/L (ref 135–145)

## 2020-03-30 MED ORDER — SODIUM CHLORIDE 0.9% FLUSH
5.0000 mL | Freq: Three times a day (TID) | INTRAVENOUS | Status: DC
  Administered 2020-03-30 – 2020-05-01 (×94): 5 mL

## 2020-03-30 MED ORDER — ROSUVASTATIN CALCIUM 5 MG PO TABS
10.0000 mg | ORAL_TABLET | Freq: Every day | ORAL | Status: DC
  Administered 2020-03-30 – 2020-04-15 (×17): 10 mg
  Filled 2020-03-30 (×18): qty 2

## 2020-03-30 NOTE — Progress Notes (Signed)
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  At that she had gone to bed last night and in the morning he had trouble waking her up. In the ED she was noted to be responsive to pain.  A code stroke was called.  She was outside the window for TPA.  An emergent CT did not show any acute abnormality.  A CTA was performed and did not reveal any obvious stenosis. An MRI of the brain was performed on 2/13:Punctate acute infarction in the left superior cerebellumadjacent to the superior cerebellar peduncle. Bilateral thalamic acute infarction extending back into the mid brain. This is consistent with artery of [place] infarction.  Subjective: Difficult to arouse this AM.  Assessment & Plan:   Principal Problem:   Acute encephalopathy secondary to bilateral thalamic infarcts -Prior history of embolic infarcts -The patient currently has an NG tube  -Neurology and the stroke team have seen her -Started on amantadine and Ritalin to help improve her lethargy-  -Due to her lethargy, she has a cor track and is receiving Osmolite -Palliative care has been consulted and the family would like to continue aggressive care but does not want CPR or shocks and thus she is a DNR -I appreciate palliative care following and speaking with family about the goals of care- The patient's husband will discuss further plans for PEG vs comfort care with his children.   - 2/22> more awake - was able to work with OT/PT a little, was following some commands and making attempts to speak although speech still unintelligible.  - SLP> Pt consumed honey thick via teaspoon and cup sip x3 and 1/4 tsp puree x3. Note delayed AP  transit with suspected delay in swallow initiation per palpation. Vocal quality remained clear and no overt coughing exhibited this date.    Active Problems: Intermittent agitation - waking up from her brain injury and agitated - have asked for a sitter - as sitter not available, she was in soft wrist restrains as she pulled out her IV.  CKD 3A- AKI due to dehydration -Baseline creatinine appears to be 1.0-1.3 -started free water per tube and watching I & O closely to prevent heart failure exacerbation - BUN & Cr continued to rise despite adequate hydration - 2/19>- Cr rose to 2.15- Ordered a renal ultrasound which showed right sided hydronephrosis and nephrolithiasis with a UPJ stone -   consulted nephrology and urology-  -  IR placed a right nephrostomy tube on 2/21 - renal function now improving  Grade 2 diastolic heart failure-chronic -2D echo performed during this admission reveals grade 2 diastolic dysfunction - following fluid status closely  Gluteal skin breakdown - have ordered Destin and barrier cream - will switch to an air mattress to prevent further skin breakdown- Due to NG tube and risk for aspiration, it is difficult to turn her in the bed Q 4 hrs     Diabetes mellitus 2 -Continue on insulin sliding scale every 4 hours while she is on tube feeds Component Value Date/Time   HGBA1C 7.6 (H) 03/18/2020 1936      Hypothyroidism -Continue Synthroid per tube    Time spent in minutes: 35 DVT prophylaxis: Heparin Code Status:  DO NOT RESUSCITATE Family Communication: Husband-  Level of Care: Level of care: Progressive- switch to med tele today Disposition Plan:  Status is: Inpatient  Remains inpatient appropriate because:IV treatments appropriate due to intensity of illness or inability to take PO   Dispo: The patient is from: Home              Anticipated d/c is to: To be determined               Anticipated d/c date is: > 3 days              Patient currently  is not medically stable to d/c.   Difficult to place patient No   Consultants:   Neurology  Urology  Nephrology Procedures:   Cor trac Antimicrobials:  Anti-infectives (From admission, onward)   Start     Dose/Rate Route Frequency Ordered Stop   03/26/20 0730  cefTRIAXone (ROCEPHIN) 1 g in sodium chloride 0.9 % 100 mL IVPB        1 g 200 mL/hr over 30 Minutes Intravenous Every 24 hours 03/26/20 0644 03/30/20 0844   03/18/20 1900  azithromycin (ZITHROMAX) 500 mg in sodium chloride 0.9 % 250 mL IVPB  Status:  Discontinued        500 mg 250 mL/hr over 60 Minutes Intravenous Every 24 hours 03/18/20 1830 03/19/20 1106   03/18/20 1900  cefTRIAXone (ROCEPHIN) 2 g in sodium chloride 0.9 % 100 mL IVPB  Status:  Discontinued        2 g 200 mL/hr over 30 Minutes Intravenous Every 24 hours 03/18/20 1830 03/19/20 1106       Objective: Vitals:   03/29/20 1719 03/29/20 2023 03/30/20 0309 03/30/20 0800  BP: 132/87 (!) 166/97 (!) 175/85 (!) 166/79  Pulse: 76 93 85 80  Resp: 18 19 16 18   Temp: 98.2 F (36.8 C) 99.4 F (37.4 C) 97.9 F (36.6 C) 99.1 F (37.3 C)  TempSrc: Axillary Oral Oral Axillary  SpO2: 100%  96% 94%  Weight:   78.9 kg   Height:        Intake/Output Summary (Last 24 hours) at 03/30/2020 1147 Last data filed at 03/30/2020 1000 Gross per 24 hour  Intake 1350 ml  Output 1880 ml  Net -530 ml   Filed Weights   03/28/20 0500 03/29/20 0342 03/30/20 0309  Weight: 108.2 kg 107.9 kg 78.9 kg    Examination: General exam: Appears comfortable  HEENT: PERRL, no sclera icterus or thrush Respiratory system: Clear to auscultation. Respiratory effort normal. Cardiovascular system: S1 & S2 heard, regular rate and rhythm Gastrointestinal system: Abdomen soft, non-tender, nondistended. Normal bowel sounds   Central nervous system: asleep. Extremities: No cyanosis, clubbing or edema Skin: No rashes or ulcers Psychiatry:  cannot assess  Data Reviewed: I have personally  reviewed following labs and imaging studies  CBC: Recent Labs  Lab 03/25/20 0648 03/27/20 1009  WBC 6.5 5.8  HGB 11.2* 11.7*  HCT 34.8* 35.9*  MCV 98.9 101.1*  PLT 208 222   Basic Metabolic Panel: Recent Labs  Lab 03/26/20 0726 03/27/20 0225 03/28/20 0325 03/29/20 0407 03/30/20 0337  NA 135 138 142 140 139  K 5.2* 5.2* 4.5 4.8 4.7  CL 97* 100 102 102 102  CO2 27 26 26 28 26   GLUCOSE 187* 155* 178* 197* 269*  BUN 55* 61* 51* 36* 33*  CREATININE 2.67* 2.52* 1.77* 1.24* 1.15*  CALCIUM 9.2 9.2 9.3 9.1 9.6  PHOS 5.7* 6.8* 5.0*  4.0 3.9   GFR: Estimated Creatinine Clearance: 35.6 mL/min (A) (by C-G formula based on SCr of 1.15 mg/dL (H)). Liver Function Tests: Recent Labs  Lab 03/26/20 0726 03/27/20 0225 03/28/20 0325 03/29/20 0407 03/30/20 0337  ALBUMIN 2.5* 2.5* 2.6* 2.5* 2.8*   No results for input(s): LIPASE, AMYLASE in the last 168 hours. No results for input(s): AMMONIA in the last 168 hours. Coagulation Profile: Recent Labs  Lab 03/27/20 1009  INR 1.0   Cardiac Enzymes: No results for input(s): CKTOTAL, CKMB, CKMBINDEX, TROPONINI in the last 168 hours. BNP (last 3 results) No results for input(s): PROBNP in the last 8760 hours. HbA1C: No results for input(s): HGBA1C in the last 72 hours. CBG: Recent Labs  Lab 03/28/20 1548 03/28/20 2010 03/28/20 2339 03/29/20 0337 03/29/20 0757  GLUCAP 193* 156* 181* 195* 163*   Lipid Profile: No results for input(s): CHOL, HDL, LDLCALC, TRIG, CHOLHDL, LDLDIRECT in the last 72 hours. Thyroid Function Tests: No results for input(s): TSH, T4TOTAL, FREET4, T3FREE, THYROIDAB in the last 72 hours. Anemia Panel: Recent Labs    03/28/20 0900  VITAMINB12 266  FOLATE 16.1  FERRITIN 129  TIBC 316  IRON 56  RETICCTPCT 2.2   Urine analysis:    Component Value Date/Time   COLORURINE YELLOW 03/25/2020 1614   APPEARANCEUR CLOUDY (A) 03/25/2020 1614   LABSPEC 1.010 03/25/2020 1614   PHURINE 5.0 03/25/2020 1614    GLUCOSEU 50 (A) 03/25/2020 1614   HGBUR SMALL (A) 03/25/2020 1614   BILIRUBINUR NEGATIVE 03/25/2020 1614   KETONESUR NEGATIVE 03/25/2020 1614   PROTEINUR NEGATIVE 03/25/2020 1614   UROBILINOGEN 0.2 11/22/2013 0945   NITRITE NEGATIVE 03/25/2020 1614   LEUKOCYTESUR LARGE (A) 03/25/2020 1614   Sepsis Labs: @LABRCNTIP (procalcitonin:4,lacticidven:4) ) Recent Results (from the past 240 hour(s))  Culture, Urine     Status: Abnormal   Collection Time: 03/26/20  6:44 AM   Specimen: Urine, Random  Result Value Ref Range Status   Specimen Description URINE, RANDOM  Final   Special Requests   Final    NONE Performed at [facility], [address] [place], [place]    Culture >=100,000 COLONIES/mL YEAST (A)  Final   Report Status 03/27/2020 FINAL  Final         Radiology Studies: No results found.    Scheduled Meds: . amantadine  50 mg Per Tube Daily  . amLODipine  5 mg Per Tube Daily  . chlorhexidine  15 mL Mouth Rinse BID  . Chlorhexidine Gluconate Cloth  6 each Topical Daily  . clopidogrel  75 mg Per Tube Q breakfast  . feeding supplement (PROSource TF)  45 mL Per Tube TID  . free water  200 mL Per Tube Q6H  . heparin  5,000 Units Subcutaneous Q8H  . levothyroxine  112 mcg Per Tube QAC breakfast  . liver oil-zinc oxide   Topical BID  . mouth rinse  15 mL Mouth Rinse q12n4p  . sodium chloride flush  3 mL Intravenous Once  . sodium chloride flush  5 mL Intracatheter Q8H   Continuous Infusions: . sodium chloride 10 mL/hr at 03/24/20 0305  . feeding supplement (OSMOLITE 1.2 CAL) 1,000 mL (03/29/20 2330)     LOS: 12 days      03/31/20, MD [facility] Pager: [email or web address] 03/30/2020, 11:47 AM

## 2020-03-30 NOTE — Progress Notes (Signed)
Referring Physician(s): Herrick,B  Supervising Physician: Ruel Favors  Patient Status:  [facility] - In-pt  Chief Complaint:  Right hydronephrosis, lethargy/prior stroke  Subjective: Pt sleeping, not interactive   Allergies: Atorvastatin, Ezetimibe-simvastatin, Lovastatin, Metformin hcl, Pravastatin, Codeine, Simvastatin, Sulfa antibiotics, and Sulfites  Medications: Prior to Admission medications   Medication Sig Start Date End Date Taking? Authorizing Provider  amLODipine (NORVASC) 5 MG tablet Take 5 mg by mouth every morning. 12/22/19  Yes [provider]  B Complex Vitamins (VITAMIN B COMPLEX) TABS Take 1 tablet by mouth daily after breakfast.   Yes [provider]  Cholecalciferol (VITAMIN D3) 2000 UNITS TABS Take 2,000 Units by mouth daily.    Yes [provider]  clopidogrel (PLAVIX) 75 MG tablet Take 1 tablet (75 mg total) by mouth daily with breakfast. 11/17/11  Yes Vassie Loll, MD  DULoxetine (CYMBALTA) 30 MG capsule TAKE 1 CAPSULE(30 MG) BY MOUTH DAILY Patient taking differently: Take 30 mg by mouth daily. 08/25/19  Yes Marcello Fennel, MD  levothyroxine (SYNTHROID) 112 MCG tablet Take 112 mcg by mouth daily before breakfast.   Yes [provider]  losartan (COZAAR) 100 MG tablet Take 100 mg by mouth daily. 04/29/19  Yes [provider]  nitrofurantoin (MACRODANTIN) 100 MG capsule Take 100 mg by mouth daily. 03/09/20  Yes [provider]  Omega-3 Fatty Acids (FISH OIL) 1000 MG CAPS Take 1,000 mg by mouth 2 (two) times daily.   Yes [provider]  pioglitazone (ACTOS) 45 MG tablet Take 45 mg by mouth daily.   Yes [provider]  propranolol (INDERAL) 40 MG tablet Take 40 mg by mouth daily.  04/13/17  Yes [provider]  traZODone (DESYREL) 50 MG tablet TAKE 1 TABLET BY MOUTH AT BEDTIME Patient taking differently: Take 50 mg by mouth at bedtime. 07/01/17  Yes Patel, Maryln Gottron, MD   levothyroxine (SYNTHROID, LEVOTHROID) 100 MCG tablet Take 1 tablet (100 mcg total) by mouth daily. Patient not taking: No sig reported 11/17/11   Vassie Loll, MD  methocarbamol (ROBAXIN) 500 MG tablet Take 1 tablet (500 mg total) by mouth every 8 (eight) hours as needed for muscle spasms. Patient not taking: No sig reported 04/13/19   Marcello Fennel, MD  rosuvastatin (CRESTOR) 5 MG tablet Take 1 tablet (5 mg total) by mouth 2 (two) times a week for 30 doses. 11/29/19 03/10/20  Azucena Fallen, MD     Vital Signs: BP (!) 166/79 (BP Location: Right Arm)   Pulse 80   Temp 99.1 F (37.3 C) (Axillary)   Resp 18   Ht 5\' 3"  (1.6 m)   Wt 174 lb (78.9 kg)   SpO2 94%   BMI 30.82 kg/m   Physical Exam asleep, not interactive; right PCN intact, dressing dry, OP 1.6 liters yesterday bloody urine, drain flushed  Imaging: RENAL  Result Date: 03/27/2020 CLINICAL DATA:  Hydronephrosis, diabetes mellitus, hypertension, history of renal failure EXAM: RENAL / URINARY TRACT ULTRASOUND COMPLETE COMPARISON:  03/25/2020 renal ultrasound and CT exams FINDINGS: Right Kidney: Renal measurements: 10.5 x 5.3 x 6.2 cm = volume: 181 mL. Cortical thinning. Normal cortical echogenicity. Severe hydronephrosis again identified. No mass or definite calcification identified. Left Kidney: Renal measurements: 9.6 x 4.5 x 4.2 cm = volume: 94 mL. Cortical thinning. Upper normal cortical echogenicity. No mass, hydronephrosis, or shadowing calcification. Bladder: Decompressed by Foley catheter, unable to evaluate. Other: N/A IMPRESSION: Persistent severe RIGHT hydronephrosis. BILATERAL renal cortical thinning. Electronically Signed  By: Ulyses Southward M.D.   On: 03/27/2020 08:17   IR US Guidance  Result Date: 03/27/2020 INDICATION: 87 year old female with right hydronephrosis and azotemia. EXAM: 1. ULTRASOUND GUIDANCE FOR PUNCTURE OF THE RIGHT RENAL COLLECTING SYSTEM 2. RIGHT PERCUTANEOUS NEPHROSTOMY TUBE PLACEMENT.  COMPARISON:  03/25/2020, 03/27/2020 MEDICATIONS: None. ANESTHESIA/SEDATION: Moderate (conscious) sedation was employed during this procedure. A total of Versed 1 mg and Fentanyl 0 mcg was administered intravenously. Moderate Sedation Time: 17 minutes. The patient's level of consciousness and vital signs were monitored continuously by radiology nursing throughout the procedure under my direct supervision. CONTRAST:  10 mL mL Omnipaque 300-administered into the renal collecting system FLUOROSCOPY TIME:  One minutes 30 seconds, 9 mGy COMPLICATIONS: None immediate. PROCEDURE: The procedure, risks, benefits, and alternatives were explained to the patient. Questions regarding the procedure were encouraged and answered. The patient understands and consents to the procedure. A timeout was performed prior to the initiation of the procedure. The right flank region was prepped and draped in the usual sterile fashion and a sterile drape was applied covering the operative field. A sterile gown and sterile gloves were used for the procedure. Local anesthesia was provided with 1% Lidocaine with epinephrine. Ultrasound was used to localize the right kidney. Under direct ultrasound guidance, a 20 gauge needle was advanced into the renal collecting system. An ultrasound image documentation was performed. Access within the collecting system was confirmed with the efflux of urine followed by limited contrast injection. Over a Nitrex wire, the tract was dilated with an Accustick stent. Next, under intermittent fluoroscopic guidance and over a short Amplatz wire, the track was dilated ultimately allowing placement of a 10.2-French percutaneous nephrostomy catheter which was advanced to the level of the renal pelvis where the coil was formed and locked. Contrast was injected and several spot fluoroscopic images were obtained in various obliquities. The catheter was secured at the skin with a Prolene retention suture and stat lock device  and connected to a gravity bag was placed. Dressings were applied. The patient tolerated procedure well without immediate postprocedural complication. FINDINGS: Ultrasound scanning demonstrates a moderate to severely dilated right collecting system. There is an apparent ureteropelvic junction obstruction. Under a combination of ultrasound and fluoroscopic guidance, a posterior inferior calix was targeted allowing placement of a 10.2 French percutaneous nephrostomy catheter with end coiled and locked within the renal pelvis. Contrast injection confirmed appropriate positioning. IMPRESSION: Successful ultrasound and fluoroscopic guided placement of a right sided 10.2 [place] PCN. Limited nephrostogram demonstrates severe hydronephrosis and ureteropelvic junction obstruction. PLAN: Return in 4 weeks for routine check and exchange pending interim definitive Urological intervention. Marliss Coots, MD [facility] Radiology Electronically Signed   By: Marliss Coots MD   On: 03/27/2020 14:46   IR NEPHROSTOMY PLACEMENT RIGHT  Result Date: 03/27/2020 INDICATION: 87 year old female with right hydronephrosis and azotemia. EXAM: 1. ULTRASOUND GUIDANCE FOR PUNCTURE OF THE RIGHT RENAL COLLECTING SYSTEM 2. RIGHT PERCUTANEOUS NEPHROSTOMY TUBE PLACEMENT. COMPARISON:  03/25/2020, 03/27/2020 MEDICATIONS: None. ANESTHESIA/SEDATION: Moderate (conscious) sedation was employed during this procedure. A total of Versed 1 mg and Fentanyl 0 mcg was administered intravenously. Moderate Sedation Time: 17 minutes. The patient's level of consciousness and vital signs were monitored continuously by radiology nursing throughout the procedure under my direct supervision. CONTRAST:  10 mL mL Omnipaque 300-administered into the renal collecting system FLUOROSCOPY TIME:  One minutes 30 seconds, 9 mGy COMPLICATIONS: None immediate. PROCEDURE: The procedure, risks, benefits, and alternatives were  explained to the patient.  Questions regarding the procedure were encouraged and answered. The patient understands and consents to the procedure. A timeout was performed prior to the initiation of the procedure. The right flank region was prepped and draped in the usual sterile fashion and a sterile drape was applied covering the operative field. A sterile gown and sterile gloves were used for the procedure. Local anesthesia was provided with 1% Lidocaine with epinephrine. Ultrasound was used to localize the right kidney. Under direct ultrasound guidance, a 20 gauge needle was advanced into the renal collecting system. An ultrasound image documentation was performed. Access within the collecting system was confirmed with the efflux of urine followed by limited contrast injection. Over a Nitrex wire, the tract was dilated with an Accustick stent. Next, under intermittent fluoroscopic guidance and over a short Amplatz wire, the track was dilated ultimately allowing placement of a 10.2-French percutaneous nephrostomy catheter which was advanced to the level of the renal pelvis where the coil was formed and locked. Contrast was injected and several spot fluoroscopic images were obtained in various obliquities. The catheter was secured at the skin with a Prolene retention suture and stat lock device and connected to a gravity bag was placed. Dressings were applied. The patient tolerated procedure well without immediate postprocedural complication. FINDINGS: Ultrasound scanning demonstrates a moderate to severely dilated right collecting system. There is an apparent ureteropelvic junction obstruction. Under a combination of ultrasound and fluoroscopic guidance, a posterior inferior calix was targeted allowing placement of a 10.2 French percutaneous nephrostomy catheter with end coiled and locked within the renal pelvis. Contrast injection confirmed appropriate positioning. IMPRESSION: Successful ultrasound and fluoroscopic  guided placement of a right sided 10.2 [place] PCN. Limited nephrostogram demonstrates severe hydronephrosis and ureteropelvic junction obstruction. PLAN: Return in 4 weeks for routine check and exchange pending interim definitive Urological intervention. Marliss Coots, MD [facility] Radiology Electronically Signed   By: Marliss Coots MD   On: 03/27/2020 14:46    Labs:  CBC: Recent Labs    03/20/20 0504 03/21/20 0534 03/25/20 0648 03/27/20 1009  WBC 6.2 5.6 6.5 5.8  HGB 13.4 13.0 11.2* 11.7*  HCT 41.5 38.8 34.8* 35.9*  PLT 186 187 208 222    COAGS: Recent Labs    11/23/19 2304 03/18/20 1420 03/27/20 1009  INR 0.9 1.0 1.0  APTT 27 26  --     BMP: Recent Labs    03/27/20 0225 03/28/20 0325 03/29/20 0407 03/30/20 0337  NA 138 142 140 139  K 5.2* 4.5 4.8 4.7  CL 100 102 102 102  CO2 26 26 28 26   GLUCOSE 155* 178* 197* 269*  BUN 61* 51* 36* 33*  CALCIUM 9.2 9.3 9.1 9.6  CREATININE 2.52* 1.77* 1.24* 1.15*  GFRNONAA 18* 28* 43* 47*    LIVER FUNCTION TESTS: Recent Labs    11/23/19 2304 03/18/20 1420 03/26/20 0726 03/27/20 0225 03/28/20 0325 03/29/20 0407 03/30/20 0337  BILITOT 0.9 0.6  --   --   --   --   --   AST 21 20  --   --   --   --   --   ALT 9 11  --   --   --   --   --   ALKPHOS 73 56  --   --   --   --   --   PROT 6.8 5.5*  --   --   --   --   --   ALBUMIN  3.7 2.9*   < > 2.5* 2.6* 2.5* 2.8*   < > = values in this interval not displayed.    Assessment and Plan: Pt with hx acute encephalopathy secondary to bilateral thalamic infarcts, acute on CKD, CHF, DM, severe hydronephrosis; s/p right PCN 2/21; temp 99.1, creat 1.15(1.24); urine cx- yeast; cont current tx; flush PCN daily until hematuria improves; monitor labs; additional plans per urology/TRH    Electronically Signed: D. Jeananne RamaKevin Camey Edell, PA-C 03/30/2020, 10:12 AM   I spent a total of 15 minutes at the the patient's bedside AND on the patient's  hospital floor or unit, greater than 50% of which was counseling/coordinating care for right nephrostomy    Patient ID: Hannah Fischer, female   DOB: 08/17/1932, 87 y.o.   MRN: [id number]

## 2020-03-30 NOTE — Progress Notes (Signed)
  Speech Language Pathology Treatment: Dysphagia  Patient Details Name: Hannah Fischer MRN: [id number] DOB: 1932-02-08 Today's Date: 03/30/2020 Time: 5885-0277 SLP Time Calculation (min) (ACUTE ONLY): 22 min  Assessment / Plan / Recommendation Clinical Impression  Continued PO trials, pt spouse present for session. Pt with vocalizations to cueing, largely unintelligible. Pt opened eyes for brief duration, though mainly remained closed. Pt with some oral defensiveness this date, not noted during prior ST interactions. Pt consumed honey thick via teaspoon and cup sip x3 and 1/4 tsp puree x3. Note delayed AP transit with suspected delay in swallow initiation per palpation. Vocal quality remained clear and no overt coughing exhibited this date. Mentation remains current barrier for diet initiation. SLP to continue to follow.     HPI HPI: [name] is a 87 y.o. female with bilateral medial thalamic and punctate left superior cerebellar infarcts. PMHx significant for multiple CVAs with most recent acute/subacute R frontal lobe and L parietal and occipital lobe infarcts ~3 months ago, residual L-sided hemiplegia, hypothyroidism, GERD, HTN, TIA, DM, PE, anxiety, HTN, and HLD.      SLP Plan  Continue with current plan of care       Recommendations  Diet recommendations: NPO Medication Administration: Via alternative means                Oral Care Recommendations: Oral care QID Follow up Recommendations: Skilled Nursing facility SLP Visit Diagnosis: Dysphagia, oropharyngeal phase (R13.12) Plan: Continue with current plan of care       GO                Ardyth Gal MA, CCC-SLP Acute Rehabilitation Services   03/30/2020, 11:33 AM

## 2020-03-31 ENCOUNTER — Inpatient Hospital Stay (HOSPITAL_COMMUNITY): Payer: Medicare Other

## 2020-03-31 DIAGNOSIS — Z515 Encounter for palliative care: Secondary | ICD-10-CM | POA: Diagnosis not present

## 2020-03-31 DIAGNOSIS — G9340 Encephalopathy, unspecified: Secondary | ICD-10-CM | POA: Diagnosis not present

## 2020-03-31 DIAGNOSIS — Z7189 Other specified counseling: Secondary | ICD-10-CM | POA: Diagnosis not present

## 2020-03-31 DIAGNOSIS — N179 Acute kidney failure, unspecified: Secondary | ICD-10-CM | POA: Diagnosis not present

## 2020-03-31 DIAGNOSIS — Z66 Do not resuscitate: Secondary | ICD-10-CM | POA: Diagnosis not present

## 2020-03-31 LAB — CBC WITH DIFFERENTIAL/PLATELET
Abs Immature Granulocytes: 0.06 10*3/uL (ref 0.00–0.07)
Basophils Absolute: 0.1 10*3/uL (ref 0.0–0.1)
Basophils Relative: 1 %
Eosinophils Absolute: 0.4 10*3/uL (ref 0.0–0.5)
Eosinophils Relative: 5 %
HCT: 33.8 % — ABNORMAL LOW (ref 36.0–46.0)
Hemoglobin: 11.3 g/dL — ABNORMAL LOW (ref 12.0–15.0)
Immature Granulocytes: 1 %
Lymphocytes Relative: 15 %
Lymphs Abs: 1.1 10*3/uL (ref 0.7–4.0)
MCH: 32.9 pg (ref 26.0–34.0)
MCHC: 33.4 g/dL (ref 30.0–36.0)
MCV: 98.5 fL (ref 80.0–100.0)
Monocytes Absolute: 0.7 10*3/uL (ref 0.1–1.0)
Monocytes Relative: 9 %
Neutro Abs: 5.1 10*3/uL (ref 1.7–7.7)
Neutrophils Relative %: 69 %
Platelets: 283 10*3/uL (ref 150–400)
RBC: 3.43 MIL/uL — ABNORMAL LOW (ref 3.87–5.11)
RDW: 12.8 % (ref 11.5–15.5)
WBC: 7.4 10*3/uL (ref 4.0–10.5)
nRBC: 0 % (ref 0.0–0.2)

## 2020-03-31 LAB — BASIC METABOLIC PANEL
Anion gap: 14 (ref 5–15)
BUN: 30 mg/dL — ABNORMAL HIGH (ref 8–23)
CO2: 27 mmol/L (ref 22–32)
Calcium: 9.7 mg/dL (ref 8.9–10.3)
Chloride: 96 mmol/L — ABNORMAL LOW (ref 98–111)
Creatinine, Ser: 1.05 mg/dL — ABNORMAL HIGH (ref 0.44–1.00)
GFR, Estimated: 52 mL/min — ABNORMAL LOW (ref >60)
Glucose, Bld: 290 mg/dL — ABNORMAL HIGH (ref 70–99)
Potassium: 4.7 mmol/L (ref 3.5–5.1)
Sodium: 137 mmol/L (ref 135–145)

## 2020-03-31 LAB — GLUCOSE, CAPILLARY
Glucose-Capillary: 255 mg/dL — ABNORMAL HIGH (ref 70–99)
Glucose-Capillary: 160 mg/dL — ABNORMAL HIGH (ref 70–99)
Glucose-Capillary: 141 mg/dL — ABNORMAL HIGH (ref 70–99)
Glucose-Capillary: 234 mg/dL — ABNORMAL HIGH (ref 70–99)

## 2020-03-31 MED ORDER — INSULIN ASPART 100 UNIT/ML ~~LOC~~ SOLN
0.0000 [IU] | SUBCUTANEOUS | Status: DC
  Administered 2020-03-31: 3 [IU] via SUBCUTANEOUS
  Administered 2020-03-31: 2 [IU] via SUBCUTANEOUS
  Administered 2020-03-31: 1 [IU] via SUBCUTANEOUS
  Administered 2020-03-31: 3 [IU] via SUBCUTANEOUS
  Administered 2020-04-01 (×2): 5 [IU] via SUBCUTANEOUS
  Administered 2020-04-01 (×3): 3 [IU] via SUBCUTANEOUS
  Administered 2020-04-01 – 2020-04-02 (×2): 2 [IU] via SUBCUTANEOUS
  Administered 2020-04-02: 5 [IU] via SUBCUTANEOUS
  Administered 2020-04-02: 2 [IU] via SUBCUTANEOUS
  Administered 2020-04-02: 3 [IU] via SUBCUTANEOUS
  Administered 2020-04-02: 1 [IU] via SUBCUTANEOUS
  Administered 2020-04-02 – 2020-04-03 (×3): 3 [IU] via SUBCUTANEOUS
  Administered 2020-04-03: 5 [IU] via SUBCUTANEOUS
  Administered 2020-04-03 (×2): 3 [IU] via SUBCUTANEOUS
  Administered 2020-04-03: 2 [IU] via SUBCUTANEOUS
  Administered 2020-04-04: 1 [IU] via SUBCUTANEOUS
  Administered 2020-04-04 (×3): 2 [IU] via SUBCUTANEOUS
  Administered 2020-04-04 – 2020-04-05 (×2): 1 [IU] via SUBCUTANEOUS
  Administered 2020-04-05: 2 [IU] via SUBCUTANEOUS
  Administered 2020-04-05 (×2): 1 [IU] via SUBCUTANEOUS
  Administered 2020-04-06 (×3): 2 [IU] via SUBCUTANEOUS
  Administered 2020-04-06 – 2020-04-07 (×5): 3 [IU] via SUBCUTANEOUS
  Administered 2020-04-07 – 2020-04-08 (×3): 2 [IU] via SUBCUTANEOUS
  Administered 2020-04-08: 3 [IU] via SUBCUTANEOUS
  Administered 2020-04-08 (×2): 1 [IU] via SUBCUTANEOUS
  Administered 2020-04-08: 2 [IU] via SUBCUTANEOUS
  Administered 2020-04-08: 3 [IU] via SUBCUTANEOUS
  Administered 2020-04-09: 2 [IU] via SUBCUTANEOUS
  Administered 2020-04-09: 1 [IU] via SUBCUTANEOUS
  Administered 2020-04-09 – 2020-04-10 (×3): 2 [IU] via SUBCUTANEOUS
  Administered 2020-04-10: 1 [IU] via SUBCUTANEOUS
  Administered 2020-04-10: 2 [IU] via SUBCUTANEOUS
  Administered 2020-04-10 (×2): 1 [IU] via SUBCUTANEOUS
  Administered 2020-04-10: 2 [IU] via SUBCUTANEOUS
  Administered 2020-04-10: 1 [IU] via SUBCUTANEOUS
  Administered 2020-04-11: 2 [IU] via SUBCUTANEOUS
  Administered 2020-04-11: 3 [IU] via SUBCUTANEOUS
  Administered 2020-04-11 – 2020-04-12 (×5): 2 [IU] via SUBCUTANEOUS
  Administered 2020-04-12 – 2020-04-13 (×5): 1 [IU] via SUBCUTANEOUS
  Administered 2020-04-13 – 2020-04-14 (×4): 2 [IU] via SUBCUTANEOUS
  Administered 2020-04-14 – 2020-04-16 (×11): 1 [IU] via SUBCUTANEOUS
  Administered 2020-04-17: 2 [IU] via SUBCUTANEOUS
  Administered 2020-04-17 – 2020-04-18 (×2): 1 [IU] via SUBCUTANEOUS
  Administered 2020-04-18 (×3): 2 [IU] via SUBCUTANEOUS
  Administered 2020-04-18 – 2020-04-19 (×4): 1 [IU] via SUBCUTANEOUS
  Administered 2020-04-19 – 2020-04-20 (×6): 2 [IU] via SUBCUTANEOUS
  Administered 2020-04-20: 1 [IU] via SUBCUTANEOUS
  Administered 2020-04-20 – 2020-04-21 (×4): 2 [IU] via SUBCUTANEOUS
  Administered 2020-04-21 (×2): 1 [IU] via SUBCUTANEOUS
  Administered 2020-04-21: 2 [IU] via SUBCUTANEOUS
  Administered 2020-04-22: 1 [IU] via SUBCUTANEOUS
  Administered 2020-04-22: 2 [IU] via SUBCUTANEOUS
  Administered 2020-04-22: 1 [IU] via SUBCUTANEOUS
  Administered 2020-04-22: 2 [IU] via SUBCUTANEOUS
  Administered 2020-04-22 – 2020-04-23 (×3): 1 [IU] via SUBCUTANEOUS
  Administered 2020-04-23: 2 [IU] via SUBCUTANEOUS
  Administered 2020-04-23 (×2): 1 [IU] via SUBCUTANEOUS
  Administered 2020-04-23: 2 [IU] via SUBCUTANEOUS
  Administered 2020-04-24 (×2): 1 [IU] via SUBCUTANEOUS
  Administered 2020-04-24: 2 [IU] via SUBCUTANEOUS
  Administered 2020-04-24: 1 [IU] via SUBCUTANEOUS
  Administered 2020-04-24: 2 [IU] via SUBCUTANEOUS
  Administered 2020-04-25: 1 [IU] via SUBCUTANEOUS
  Administered 2020-04-25: 2 [IU] via SUBCUTANEOUS
  Administered 2020-04-25: 1 [IU] via SUBCUTANEOUS
  Administered 2020-04-25: 2 [IU] via SUBCUTANEOUS
  Administered 2020-04-26 (×4): 1 [IU] via SUBCUTANEOUS
  Administered 2020-04-26 – 2020-04-27 (×4): 2 [IU] via SUBCUTANEOUS
  Administered 2020-04-27 (×2): 1 [IU] via SUBCUTANEOUS
  Administered 2020-04-28: 2 [IU] via SUBCUTANEOUS
  Administered 2020-04-28: 1 [IU] via SUBCUTANEOUS
  Administered 2020-04-28 (×2): 2 [IU] via SUBCUTANEOUS
  Administered 2020-04-28 – 2020-04-29 (×5): 1 [IU] via SUBCUTANEOUS
  Administered 2020-04-30 (×3): 2 [IU] via SUBCUTANEOUS
  Administered 2020-05-01: 1 [IU] via SUBCUTANEOUS
  Administered 2020-05-01 (×2): 2 [IU] via SUBCUTANEOUS

## 2020-03-31 NOTE — Progress Notes (Signed)
Physical Therapy Treatment Patient Details Name: Hannah Fischer MRN: [id number] DOB: 10-Jul-1932 Today's Date: 03/31/2020    History of Present Illness 87 y.o. female presenting code stroke. Imaging (+) for bilateral medial thalamic and punctate infarcts and L superior cerebellar infarcts. PMHx significant for multiple CVAs with most recent acute/subacute R frontal lobe and L parietal and occipital lobe infarcts ~3 months ago, residual L-sided hemiplegia with recent d/c from outpatient PT/OT with return to baseline level of function, TIA, DM, PE, anxiety, HTN, and HLD.    PT Comments    Pt is limited by lethargy today, not opening eyes despite change in position and even standing at edge of bed. Pt does not consistently follow commands this session and does no consistently communicate. PT notes tremors of BUE as well as possible increased adductor tone during this session, RN made aware. Pt will benefit from continued attempts at mobilization and acute PT POC to reduce falls risk and caregiver burden.   Follow Up Recommendations  SNF;Supervision/Assistance - 24 hour     Equipment Recommendations  Wheelchair cushion (measurements PT);Wheelchair (measurements PT);Hospital bed (mechanical lift)    Recommendations for Other Services       Precautions / Restrictions Precautions Precautions: Fall Precaution Comments: cortrak, bilateral mittens, nephrostomy drain. Restrictions Weight Bearing Restrictions: No    Mobility  Bed Mobility Overal bed mobility: Needs Assistance Bed Mobility: Supine to Sit;Sit to Supine     Supine to sit: Total assist Sit to supine: Total assist        Transfers Overall transfer level: Needs assistance Equipment used: 1 person hand held assist Transfers: Sit to/from Stand Sit to Stand: Total assist         General transfer comment: PT provides L knee block and BUE support  Ambulation/Gait                 Stairs              Wheelchair Mobility    Modified Rankin (Stroke Patients Only) Modified Rankin (Stroke Patients Only) Pre-Morbid Rankin Score: No significant disability Modified Rankin: Severe disability     Balance Overall balance assessment: Needs assistance Sitting-balance support: Feet supported;Single extremity supported Sitting balance-Leahy Scale: Poor Sitting balance - Comments: minA-modA at edge of bed, leaning to R side Postural control: Right lateral lean   Standing balance-Leahy Scale: Zero Standing balance comment: max-totalA                            Cognition Arousal/Alertness: Lethargic Behavior During Therapy: Flat affect Overall Cognitive Status: Impaired/Different from baseline Area of Impairment: Attention;Following commands;Problem solving                       Following Commands: Follows one step commands inconsistently (follows <5% of commands, only lifts head into extension once and squeezes with R hand once to command)     Problem Solving: Slow processing;Decreased initiation        Exercises      General Comments General comments (skin integrity, edema, etc.): Spouse present, reports the patient has been unable to arouse. Pt on 2L [place] but sats are stable during session on RA. PT noted tremoring of RUE and then later LUE. PT also notes possible increased adductor tone of LE vs pt resisting against PT. Difficult to assess Fischer to cognitive deficits.      Pertinent Vitals/Pain Pain Assessment: Faces Faces Pain Scale: Hurts a  little bit Pain Location: generalized grimacing with mobility Pain Descriptors / Indicators: Grimacing Pain Intervention(s): Monitored during session    Home Living                      Prior Function            PT Goals (current goals can now be found in the care plan section) Acute Rehab PT Goals Patient Stated Goal: family goal to improve functional mobility Progress towards PT goals: Not  progressing toward goals - comment (limited by lethargy)    Frequency    Min 3X/week      PT Plan Current plan remains appropriate    Co-evaluation              AM-PAC PT "6 Clicks" Mobility   Outcome Measure  Help needed turning from your back to your side while in a flat bed without using bedrails?: Total Help needed moving from lying on your back to sitting on the side of a flat bed without using bedrails?: Total Help needed moving to and from a bed to a chair (including a wheelchair)?: Total Help needed standing up from a chair using your arms (e.g., wheelchair or bedside chair)?: Total Help needed to walk in hospital room?: Total Help needed climbing 3-5 steps with a railing? : Total 6 Click Score: 6    End of Session   Activity Tolerance: Patient limited by lethargy Patient left: in bed;with call bell/phone within reach;with bed alarm set;with restraints reapplied;with SCD's reapplied Nurse Communication: Mobility status;Need for lift equipment PT Visit Diagnosis: Other symptoms and signs involving the nervous system (R29.898);Difficulty in walking, not elsewhere classified (R26.2);Unsteadiness on feet (R26.81);Muscle weakness (generalized) (M62.81)     Time: 8295-6213 PT Time Calculation (min) (ACUTE ONLY): 30 min  Charges:  $Therapeutic Activity: 23-37 mins                     Arlyss Gandy, PT, DPT Acute Rehabilitation Pager: [phone]    Arlyss Gandy 03/31/2020, 2:47 PM

## 2020-03-31 NOTE — Progress Notes (Addendum)
   Palliative Medicine Inpatient Follow Up Note   Family Meeting: 03/31/2020  Participants: Hannah Fischer (spouse), Hannah Fischer (son), Hannah Fischer (daughter), Hannah Fischer (daughter in law)  Providers: Dr. Florene Glen, Tacey Ruiz  Discussion: We all met this evening to discuss Hannah Fischer's present clinical state.  Dr. Florene Glen provided a medical update. He shared that this is his first day seeing Hannah Fischer though from what he has gleaned from chart review she has not made tremendous improvements. He goes on to say that how she is presently may be as great of a recovery as her body will be able to achieve. Patients son, Hannah Fischer asked if she was going to improve and he states that there is some concern that her present state may be a new baseline. Hannah Fischer shares that he has a hard time accepting this. Dr. Florene Glen goes on to say that he would be happy to rely on Neurology to assess Hannah Fischer again and provide their expertise in regards to prognosis.  Dr. Florene Glen and I were able to share that for Hannah Fischer she cannot live with the NGT forever and we do need to make a decision to either continue with gastrostomy tube placement or to stop artifical feedings and allow nature to take its course. Reviewed that per her declaration for a natural death that a feeding tube is likely not aligned with what she would want in the long run. We shares our concerns over patients ability to maintain alertness to cooperate with PT/OT. Offered empathetic support through listeing given the magnitude of the situation being discussed.  Patients family brought up how difficult it is for Hannah Fischer to come in on a daily basis given his own advanced age and debilities. We were able to escalate this to unit leadership and gain approval for another family member to come in throughout the weekend to offer additional support to the patient.  Dr. Florene Glen kindly excused himself after a formal medical update was provided.  I was able to sit with Hannah Fischer's family thereafter. I tried to  help clarify the fear that no longer offering tube feeding is starving someone. I reviewed what maintaining aggressive measures would look like. I reviewed what transition to comfort measures would entail inclusive of medications to control pain, dyspnea, agitation, nausea, itching, and hiccups.  We discussed stopping all uneccessary measures such as blood draws, needle sticks, and frequent vital signs.   We agreed to reconvene on Monday at 1700.  SUMMARY OF RECOMMENDATIONS DNAR/DNI  Plan to continue time trial over the weekend  Plan for another family meeting on Monday at [facility]  Ongoing PMT support   Discussed with Dr. Florene Glen  Time In: 1700 Time Out: 1805 Time Spent: 65 Greater than 50% of the time was spent in counseling and coordination of care ______________________________________________________________________________________ [place] Team Team Cell Phone: [phone] Please utilize secure chat with additional questions, if there is no response within 30 minutes please call the above phone number  Palliative Medicine Team providers are available by phone from 7am to 7pm daily and can be reached through the team cell phone.  Should this patient require assistance outside of these hours, please call the patient's attending physician.

## 2020-03-31 NOTE — Progress Notes (Signed)
PROGRESS NOTE    [name]  ATF:[id number] DOB: 1933-01-24 DOA: 03/18/2020 PCP: Sigmund Hazel, MD   Chief Complaint  Patient presents with  . 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  At that she had gone to bed last night and in the morning he had trouble waking her up. In the ED she was noted to be responsive to pain.  A code stroke was called.  She was outside the window for TPA.  An emergent CT did not show any acute abnormality.  A CTA was performed and did not reveal any obvious stenosis. An MRI of the brain was performed on 2/13:Punctate acute infarction in the left superior cerebellumadjacent to the superior cerebellar peduncle. Bilateral thalamic acute infarction extending back into the mid brain. This is consistent with artery of [place] infarction.  Assessment & Plan:   Principal Problem:   Acute encephalopathy Active Problems:   Diabetes mellitus (HCC)   Hypothyroidism   History of embolic stroke  Goals of Care: GOC discussion with family today.  Appreciate palliative.  Planning to ask stroke team to reevaluate and comment on prognosis.  Plan for time limited trial over weekend and repeat meeting on Monday.  Discussed though limited as this is my first time meeting her, I suspect her prognosis for recovery is poor.   See palliative care note DNR/DNI  Bilateral Thalamic Infarcts Acute metabolic encephalopathy -Prior history of embolic infarcts -The patient currently has an NG tube  -Neurology and the stroke team have seen her -Started on amantadine and Ritalin to help improve her lethargy -Due to her lethargy, she has a cor track and is receiving Osmolite - further plans for PEG vs comfort care pending additional GOC discussions   - asymmetric pupils  2/25, discussed with Dr. Roda Shutters, recommended repeat MRI -> repeat MRI 2/25 with no new acute infarct, expected evolution of known infarcts - therapy exam limited by lethargy 2/25 (see note) - EEG 2/13 with moderate diffuse encephalopathy  Intermittent agitation - waking up from her brain injury and agitated - will liberalize visitation so family can help sit with her  CKD 3A- AKI due to dehydration -Baseline creatinine appears to be 1.0-1.3 -started free water per tube and watching I & O closely to prevent heart failure exacerbation - BUN & Cr continued to rise despite adequate hydration - 2/19>- Cr rose to 2.15- Ordered a renal ultrasound which showed right sided hydronephrosis and nephrolithiasis with a UPJ stone -  consulted nephrology and urology-  -  IR placed a right nephrostomy tube on 2/21 - renal function now improving, continue to monitor  Grade 2 diastolic heart failure-chronic -2D echo performed during this admission reveals grade 2 diastolic dysfunction - following fluid status closely  Gluteal skin breakdown - have ordered [place] and barrier cream - will switch to an air mattress to prevent further skin breakdown- Due to NG tube and risk for aspiration, it is difficult to turn her in the bed Q 4 hrs     Diabetes mellitus 2 -Continue on insulin sliding scale every 4 hours while she is on tube feeds   Hypothyroidism -Continue Synthroid per tube  DVT prophylaxis: heparin  Code Status: DNR Family Communication: husband, daughter in law, son, daughter at bedside Disposition:   Status is: Inpatient  Remains inpatient  appropriate because:Inpatient level of care appropriate due to severity of illness   Dispo: The patient is from: Home              Anticipated d/c is to: Home              Patient currently is not medically stable to d/c.   Difficult to place patient No       Consultants:    Neurology  Palliative  PCCM  Nephrology  IR  urology  Procedures:  EEG IMPRESSION: This study is suggestive of moderate diffuse encephalopathy, nonspecific etiology. No seizures or epileptiform discharges were seen throughout the recording.  2/21  EXAM: 1. ULTRASOUND GUIDANCE FOR PUNCTURE OF THE RIGHT RENAL COLLECTING SYSTEM 2. RIGHT PERCUTANEOUS NEPHROSTOMY TUBE PLACEMENT.  Antimicrobials: Anti-infectives (From admission, onward)   Start     Dose/Rate Route Frequency Ordered Stop   03/26/20 0730  cefTRIAXone (ROCEPHIN) 1 g in sodium chloride 0.9 % 100 mL IVPB        1 g 200 mL/hr over 30 Minutes Intravenous Every 24 hours 03/26/20 0644 03/30/20 0844   03/18/20 1900  azithromycin (ZITHROMAX) 500 mg in sodium chloride 0.9 % 250 mL IVPB  Status:  Discontinued        500 mg 250 mL/hr over 60 Minutes Intravenous Every 24 hours 03/18/20 1830 03/19/20 1106   03/18/20 1900  cefTRIAXone (ROCEPHIN) 2 g in sodium chloride 0.9 % 100 mL IVPB  Status:  Discontinued        2 g 200 mL/hr over 30 Minutes Intravenous Every 24 hours 03/18/20 1830 03/19/20 1106     Subjective: Unresponsive to voice  Objective: Vitals:   03/31/20 0731 03/31/20 0829 03/31/20 1236 03/31/20 1538  BP: (!) 169/87 (!) 168/82 (!) 163/73 137/71  Pulse: 85 86 80 76  Resp: 20 18 (!) 21 20  Temp: 98 F (36.7 C) 98.6 F (37 C) 97.8 F (36.6 C) 98.3 F (36.8 C)  TempSrc: Oral Oral Oral Oral  SpO2: 100% 100% 100% 100%  Weight:      Height:        Intake/Output Summary (Last 24 hours) at 03/31/2020 1844 Last data filed at 03/31/2020 1302 Gross per 24 hour  Intake -  Output 2275 ml  Net -2275 ml   Filed Weights   03/28/20 0500 03/29/20 0342 03/30/20 0309  Weight: 108.2 kg 107.9 kg 78.9 kg    Examination:  General exam: Appears comfortable  Respiratory system: Clear to auscultation. Respiratory effort normal. Cardiovascular system: S1 & S2 heard, RRR. Gastrointestinal system: Abdomen is  nondistended, soft and nontender.  NG Central nervous system: unresponsive to voice, does not follow commands, withdraws from irritative stimuli.  R pupil pinpoint, L pupil 2-3 mm.   Extremities: no LEE    Data Reviewed: I have personally reviewed following labs and imaging studies  CBC: Recent Labs  Lab 03/25/20 0648 03/27/20 1009 03/31/20 0357  WBC 6.5 5.8 7.4  NEUTROABS  --   --  5.1  HGB 11.2* 11.7* 11.3*  HCT 34.8* 35.9* 33.8*  MCV 98.9 101.1* 98.5  PLT 208 222 283    Basic Metabolic Panel: Recent Labs  Lab 03/26/20 0726 03/27/20 0225 03/28/20 0325 03/29/20 0407 03/30/20 0337 03/31/20 0357  NA 135 138 142 140 139 137  K 5.2* 5.2* 4.5 4.8 4.7 4.7  CL 97* 100 102 102 102 96*  CO2 27 26 26 28 26 27   GLUCOSE 187* 155* 178* 197* 269* 290*  BUN 55*  61* 51* 36* 33* 30*  CREATININE 2.67* 2.52* 1.77* 1.24* 1.15* 1.05*  CALCIUM 9.2 9.2 9.3 9.1 9.6 9.7  PHOS 5.7* 6.8* 5.0* 4.0 3.9  --     GFR: Estimated Creatinine Clearance: 39 mL/min (A) (by C-G formula based on SCr of 1.05 mg/dL (H)).  Liver Function Tests: Recent Labs  Lab 03/26/20 0726 03/27/20 0225 03/28/20 0325 03/29/20 0407 03/30/20 0337  ALBUMIN 2.5* 2.5* 2.6* 2.5* 2.8*    CBG: Recent Labs  Lab 03/29/20 0337 03/29/20 0757 03/31/20 0816 03/31/20 1221 03/31/20 1545  GLUCAP 195* 163* 255* 160* 141*     Recent Results (from the past 240 hour(s))  Culture, Urine     Status: Abnormal   Collection Time: 03/26/20  6:44 AM   Specimen: Urine, Random  Result Value Ref Range Status   Specimen Description URINE, RANDOM  Final   Special Requests   Final    NONE Performed at [facility], [address] [place], [place] [address]    Culture >=100,000 COLONIES/mL YEAST (A)  Final   Report Status 03/27/2020 FINAL  Final         Radiology Studies: DG Abd 1 View  Result Date: 03/30/2020 CLINICAL DATA:  NG placement EXAM: ABDOMEN - 1 VIEW COMPARISON:  None. FINDINGS: Feeding tube tip in  the proximal stomach at the GE junction region. Normal bowel gas pattern. Right nephrostomy tube noted. Surgical clips in the gallbladder fossa. Lumbar degenerative change with scoliosis. Atherosclerotic aorta. IMPRESSION: Feeding tube in the proximal stomach near the GE junction. Electronically Signed   By: Marlan Palauharles  Clark M.D.   On: 03/30/2020 12:58   MR BRAIN WO CONTRAST  Result Date: 03/31/2020 CLINICAL DATA:  Stroke follow-up EXAM: MRI HEAD WITHOUT CONTRAST TECHNIQUE: Diffusion-weighted images obtained axial coronal planes. COMPARISON:  MRI of the brain March 19, 2020 FINDINGS: Mild restricted diffusion within the bilateral thalami consistent with expected evolution of known infarcts. No other focus of restricted diffusion identified. IMPRESSION: 1. No new acute infarct identified. 2. Mildly restricted diffusion within the bilateral thalami consistent with expected evolution of known infarcts. Electronically Signed   By: Baldemar LenisKatyucia  De Macedo Rodrigues M.D.   On: 03/31/2020 12:07        Scheduled Meds: . amantadine  50 mg Per Tube Daily  . amLODipine  5 mg Per Tube Daily  . chlorhexidine  15 mL Mouth Rinse BID  . Chlorhexidine Gluconate Cloth  6 each Topical Daily  . clopidogrel  75 mg Per Tube Q breakfast  . feeding supplement (PROSource TF)  45 mL Per Tube TID  . free water  200 mL Per Tube Q6H  . heparin  5,000 Units Subcutaneous Q8H  . insulin aspart  0-9 Units Subcutaneous Q4H  . levothyroxine  112 mcg Per Tube QAC breakfast  . liver oil-zinc oxide   Topical BID  . mouth rinse  15 mL Mouth Rinse q12n4p  . rosuvastatin  10 mg Per Tube Daily  . sodium chloride flush  3 mL Intravenous Once  . sodium chloride flush  5 mL Intracatheter Q8H   Continuous Infusions: . sodium chloride 10 mL/hr at 03/24/20 0305  . feeding supplement (OSMOLITE 1.2 CAL) 1,000 mL (03/30/20 2147)     LOS: 13 days    Time spent: over 30 min    Lacretia Nicksaldwell Powell, MD [facility]   To contact  the attending provider between 7A-7P or the covering provider during after hours 7P-7A, please log into the web site [email or web address]  and access using universal [place] password for that web site. If you do not have the password, please call the hospital operator.  03/31/2020, 6:44 PM

## 2020-04-01 DIAGNOSIS — I6349 Cerebral infarction due to embolism of other cerebral artery: Secondary | ICD-10-CM

## 2020-04-01 DIAGNOSIS — N179 Acute kidney failure, unspecified: Secondary | ICD-10-CM | POA: Diagnosis not present

## 2020-04-01 DIAGNOSIS — G9340 Encephalopathy, unspecified: Secondary | ICD-10-CM | POA: Diagnosis not present

## 2020-04-01 DIAGNOSIS — R4182 Altered mental status, unspecified: Secondary | ICD-10-CM | POA: Diagnosis not present

## 2020-04-01 DIAGNOSIS — Z66 Do not resuscitate: Secondary | ICD-10-CM | POA: Diagnosis not present

## 2020-04-01 DIAGNOSIS — G8194 Hemiplegia, unspecified affecting left nondominant side: Secondary | ICD-10-CM

## 2020-04-01 DIAGNOSIS — Z7189 Other specified counseling: Secondary | ICD-10-CM | POA: Diagnosis not present

## 2020-04-01 DIAGNOSIS — Z515 Encounter for palliative care: Secondary | ICD-10-CM | POA: Diagnosis not present

## 2020-04-01 DIAGNOSIS — I639 Cerebral infarction, unspecified: Secondary | ICD-10-CM | POA: Diagnosis not present

## 2020-04-01 LAB — CBC WITH DIFFERENTIAL/PLATELET
Abs Immature Granulocytes: 0.09 10*3/uL — ABNORMAL HIGH (ref 0.00–0.07)
Basophils Absolute: 0.1 10*3/uL (ref 0.0–0.1)
Basophils Relative: 1 %
Eosinophils Absolute: 0.4 10*3/uL (ref 0.0–0.5)
Eosinophils Relative: 4 %
HCT: 36.4 % (ref 36.0–46.0)
Hemoglobin: 12.1 g/dL (ref 12.0–15.0)
Immature Granulocytes: 1 %
Lymphocytes Relative: 12 %
Lymphs Abs: 0.9 10*3/uL (ref 0.7–4.0)
MCH: 32.8 pg (ref 26.0–34.0)
MCHC: 33.2 g/dL (ref 30.0–36.0)
MCV: 98.6 fL (ref 80.0–100.0)
Monocytes Absolute: 0.7 10*3/uL (ref 0.1–1.0)
Monocytes Relative: 8 %
Neutro Abs: 6 10*3/uL (ref 1.7–7.7)
Neutrophils Relative %: 74 %
Platelets: 315 10*3/uL (ref 150–400)
RBC: 3.69 MIL/uL — ABNORMAL LOW (ref 3.87–5.11)
RDW: 12.8 % (ref 11.5–15.5)
WBC: 8.2 10*3/uL (ref 4.0–10.5)
nRBC: 0 % (ref 0.0–0.2)

## 2020-04-01 LAB — GLUCOSE, CAPILLARY
Glucose-Capillary: 191 mg/dL — ABNORMAL HIGH (ref 70–99)
Glucose-Capillary: 193 mg/dL — ABNORMAL HIGH (ref 70–99)
Glucose-Capillary: 202 mg/dL — ABNORMAL HIGH (ref 70–99)
Glucose-Capillary: 234 mg/dL — ABNORMAL HIGH (ref 70–99)
Glucose-Capillary: 245 mg/dL — ABNORMAL HIGH (ref 70–99)
Glucose-Capillary: 266 mg/dL — ABNORMAL HIGH (ref 70–99)
Glucose-Capillary: 266 mg/dL — ABNORMAL HIGH (ref 70–99)

## 2020-04-01 LAB — COMPREHENSIVE METABOLIC PANEL
ALT: 13 U/L (ref 0–44)
AST: 20 U/L (ref 15–41)
Albumin: 2.9 g/dL — ABNORMAL LOW (ref 3.5–5.0)
Alkaline Phosphatase: 75 U/L (ref 38–126)
Anion gap: 12 (ref 5–15)
BUN: 34 mg/dL — ABNORMAL HIGH (ref 8–23)
CO2: 26 mmol/L (ref 22–32)
Calcium: 9.6 mg/dL (ref 8.9–10.3)
Chloride: 100 mmol/L (ref 98–111)
Creatinine, Ser: 1.04 mg/dL — ABNORMAL HIGH (ref 0.44–1.00)
GFR, Estimated: 53 mL/min — ABNORMAL LOW (ref >60)
Glucose, Bld: 206 mg/dL — ABNORMAL HIGH (ref 70–99)
Potassium: 5.2 mmol/L — ABNORMAL HIGH (ref 3.5–5.1)
Sodium: 138 mmol/L (ref 135–145)
Total Bilirubin: 0.6 mg/dL (ref 0.3–1.2)
Total Protein: 6.5 g/dL (ref 6.5–8.1)

## 2020-04-01 LAB — MAGNESIUM: Magnesium: 1.7 mg/dL (ref 1.7–2.4)

## 2020-04-01 LAB — PHOSPHORUS: Phosphorus: 4.4 mg/dL (ref 2.5–4.6)

## 2020-04-01 MED ORDER — INSULIN DETEMIR 100 UNIT/ML ~~LOC~~ SOLN
5.0000 [IU] | Freq: Every day | SUBCUTANEOUS | Status: DC
  Administered 2020-04-01 – 2020-05-01 (×31): 5 [IU] via SUBCUTANEOUS
  Filled 2020-04-01 (×32): qty 0.05

## 2020-04-01 MED ORDER — HYPROMELLOSE (GONIOSCOPIC) 2.5 % OP SOLN
1.0000 [drp] | Freq: Four times a day (QID) | OPHTHALMIC | Status: DC | PRN
  Administered 2020-04-01 – 2020-04-27 (×3): 1 [drp] via OPHTHALMIC
  Filled 2020-04-01: qty 15

## 2020-04-01 NOTE — Progress Notes (Signed)
   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  Palliative care was consulted to aid in goals of care conversations.  Today's Discussion (04/01/2020):  *Please note that this is a verbal dictation therefore any spelling or grammatical errors are due to the "[place] [employer]" system interpretation.  Chart reviewed.   I met with Rachele at bedside I was able to illicit some mumbling this morning as a response. She did not otherwise follow my commands.   [name] is at bedside this morning and shares that Rylann woke up when he arrived opened her eyes and responded to him. When we attempted together to illicit this response it did not occur. [name] remains optimistic for improvements at this time.   At the present time we are awaiting additional insights from neurology prior to our meeting on Monday.  Objective Assessment: Vital Signs Vitals:   04/01/20 0600 04/01/20 0735  BP: (!) 165/88 (!) 165/80  Pulse: (!) 104 96  Resp: (!) 24 20  Temp: 99.4 F (37.4 C) 98.7 F (37.1 C)  SpO2: 99% 100%    Intake/Output Summary (Last 24 hours) at 04/01/2020 1120 Last data filed at 03/31/2020 2155 Gross per 24 hour  Intake 10 ml  Output 350 ml  Net -340 ml   Last Weight  Most recent update: 03/30/2020  8:30 AM   Weight  78.9 kg (174 lb)           Gen:  Elderly F in NAD HEENT:Coretrack in place, moist mucous membranes CV: Irregular rate and rhythm  PULM: RA ABD: soft/nontender  EXT: No edema  Neuro:  Somnolent can follow basic commands though does not open eyes or  speak  SUMMARY OF RECOMMENDATIONS DNAR/DNI  Plan to continue time trial over the weekend  Plan for another family meeting on Monday at [facility] insights regarding likelihood of meaningful recovery  Ongoing PMT support  Time Spent: 25 Greater than 50% of the time was spent in counseling and coordination of care ______________________________________________________________________________________ [place] Team Team Cell Phone: [phone] Please utilize secure chat with additional questions, if there is no response within 30 minutes please call the above phone number  Palliative Medicine Team providers are available by phone from 7am to 7pm daily and can be reached through the team cell phone.  Should this patient require assistance outside of these hours, please call the patient's attending physician.

## 2020-04-01 NOTE — Progress Notes (Signed)
PROGRESS NOTE    Hannah Fischer  OHF:[id number] DOB: [birth year] DOA: 03/18/2020 PCP: Sigmund Hazel, MD   Chief Complaint  Patient presents with  . 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  At that she had gone to bed last night and in the morning he had trouble waking her up. In the ED she was noted to be responsive to pain.  Hannah Fischer code stroke was called.  She was outside the window for TPA.  An emergent CT did not show any acute abnormality.  Hannah Fischer CTA was performed and did not reveal any obvious stenosis. An MRI of the brain was performed on 2/13:Punctate acute infarction in the left superior cerebellumadjacent to the superior cerebellar peduncle. Bilateral thalamic acute infarction extending back into the mid brain. This is consistent with artery of Percheron infarction.  Assessment & Plan:   Principal Problem:   Acute encephalopathy Active Problems:   Diabetes mellitus (HCC)   Hypothyroidism   History of embolic stroke  Goals of Care: GOC discussion with family today.  Appreciate palliative.  Planning to ask stroke team to reevaluate and comment on prognosis.  Plan for time limited trial over weekend and repeat meeting on Monday.  Discussed though limited as this is my first time meeting her, I suspect her prognosis for recovery is poor.   See palliative care note DNR/DNI  Bilateral Thalamic Infarcts Acute metabolic encephalopathy -Prior history of embolic infarcts -The patient currently has an NG tube  -Neurology and the stroke team have seen her -Started on amantadine (ritalin d/c'd) - (per neuro, consider modafinil)  -Due to her lethargy, she has Hannah Fischer cor track and is receiving Osmolite - further plans for PEG vs comfort care pending additional GOC discussions   -  asymmetric pupils -> repeat MRI 2/25 with no new acute infarct, expected evolution of known infarcts - therapy exam limited by lethargy 2/25 (see note) - EEG 2/13 with moderate diffuse encephalopathy  Intermittent agitation - waking up from her brain injury and agitated - will liberalize visitation so family can help sit with her  CKD 3A- AKI due to dehydration  Right Sided Hydronephrosis with UPJ stone -Baseline creatinine appears to be 1.0-1.3 -started free water per tube and watching I & O closely to prevent heart failure exacerbation - BUN & Cr continued to rise despite adequate hydration - 2/19>- Cr rose to 2.15- Ordered Hannah Fischer renal ultrasound which showed right sided hydronephrosis and nephrolithiasis with Hannah Fischer UPJ stone -  consulted nephrology and urology-  -  IR placed Jaelen Gellerman right nephrostomy tube on 2/21 - renal function now improving, continue to monitor  Grade 2 diastolic heart failure-chronic -2D echo performed during this admission reveals grade 2 diastolic dysfunction - following fluid status closely  Gluteal skin breakdown - have ordered [place] and barrier cream - will switch to an air mattress to prevent further skin breakdown- Due to NG tube and risk for aspiration, it is difficult to turn her in the bed Q 4 hrs     Diabetes mellitus 2 -Continue on insulin sliding scale every 4 hours while she is on tube feeds -basal insulin   Hypothyroidism -Continue Synthroid per tube  Hyperkalemia Mild, follow  DVT prophylaxis: heparin  Code Status: DNR Family Communication: husband at bedside Disposition:   Status is: Inpatient  Remains  inpatient appropriate because:Inpatient level of care appropriate due to severity of illness   Dispo: The patient is from: Home              Anticipated d/c is to: Home              Patient currently is not medically stable to d/c.   Difficult to place patient No       Consultants:    Neurology  Palliative  PCCM  Nephrology  IR  urology  Procedures:  EEG IMPRESSION: This study is suggestive of moderate diffuse encephalopathy, nonspecific etiology. No seizures or epileptiform discharges were seen throughout the recording.  2/21  EXAM: 1. ULTRASOUND GUIDANCE FOR PUNCTURE OF THE RIGHT RENAL COLLECTING SYSTEM 2. RIGHT PERCUTANEOUS NEPHROSTOMY TUBE PLACEMENT.  Antimicrobials: Anti-infectives (From admission, onward)   Start     Dose/Rate Route Frequency Ordered Stop   03/26/20 0730  cefTRIAXone (ROCEPHIN) 1 g in sodium chloride 0.9 % 100 mL IVPB        1 g 200 mL/hr over 30 Minutes Intravenous Every 24 hours 03/26/20 0644 03/30/20 0844   03/18/20 1900  azithromycin (ZITHROMAX) 500 mg in sodium chloride 0.9 % 250 mL IVPB  Status:  Discontinued        500 mg 250 mL/hr over 60 Minutes Intravenous Every 24 hours 03/18/20 1830 03/19/20 1106   03/18/20 1900  cefTRIAXone (ROCEPHIN) 2 g in sodium chloride 0.9 % 100 mL IVPB  Status:  Discontinued        2 g 200 mL/hr over 30 Minutes Intravenous Every 24 hours 03/18/20 1830 03/19/20 1106     Subjective: Complains of painful stimuli (cortrak pulled when RN shifting in bed) Otherwise does not meaningfully interact  Objective: Vitals:   04/01/20 0600 04/01/20 0735 04/01/20 1151 04/01/20 1528  BP: (!) 165/88 (!) 165/80 (!) 159/85 (!) 147/65  Pulse: (!) 104 96 98 80  Resp: (!) 24 20 20 20   Temp: 99.4 F (37.4 C) 98.7 F (37.1 C) 98.6 F (37 C) 97.7 F (36.5 C)  TempSrc: Axillary Oral Oral Oral  SpO2: 99% 100% (!) 76% 99%  Weight:      Height:        Intake/Output Summary (Last 24 hours) at 04/01/2020 1546 Last data filed at 04/01/2020 1400 Gross per 24 hour  Intake 2535 ml  Output 650 ml  Net 1885 ml   Filed Weights   03/28/20 0500 03/29/20 0342 03/30/20 0309  Weight: 108.2 kg 107.9 kg 78.9 kg    Examination:  General: No acute distress. Cardiovascular: Heart sounds show Hannah Fischer regular rate, and  rhythm.  Lungs: Clear to auscultation bilaterally with good air movement. Abdomen: Soft, nontender, nondistended  Neurological: says my nose hurts (or something similar when shifted in bed by RN, cortrak inadvertently pulled), does not communicate meaningfully otherwise or follow commands.  Moving all extremities.  L>R pupil Skin: Warm and dry. No rashes or lesions. Extremities: No clubbing or cyanosis. No edema.   Data Reviewed: I have personally reviewed following labs and imaging studies  CBC: Recent Labs  Lab 03/27/20 1009 03/31/20 0357 04/01/20 0257  WBC 5.8 7.4 8.2  NEUTROABS  --  5.1 6.0  HGB 11.7* 11.3* 12.1  HCT 35.9* 33.8* 36.4  MCV 101.1* 98.5 98.6  PLT 222 283 315    Basic Metabolic Panel: Recent Labs  Lab 03/27/20 0225 03/28/20 0325 03/29/20 0407 03/30/20 0337 03/31/20 0357 04/01/20 0257  NA 138 142 140 139 137 138  K  5.2* 4.5 4.8 4.7 4.7 5.2*  CL 100 102 102 102 96* 100  CO2 26 26 28 26 27 26   GLUCOSE 155* 178* 197* 269* 290* 206*  BUN 61* 51* 36* 33* 30* 34*  CREATININE 2.52* 1.77* 1.24* 1.15* 1.05* 1.04*  CALCIUM 9.2 9.3 9.1 9.6 9.7 9.6  MG  --   --   --   --   --  1.7  PHOS 6.8* 5.0* 4.0 3.9  --  4.4    GFR: Estimated Creatinine Clearance: 39.3 mL/min (Hannah Fischer) (by C-G formula based on SCr of 1.04 mg/dL (H)).  Liver Function Tests: Recent Labs  Lab 03/27/20 0225 03/28/20 0325 03/29/20 0407 03/30/20 0337 04/01/20 0257  AST  --   --   --   --  20  ALT  --   --   --   --  13  ALKPHOS  --   --   --   --  75  BILITOT  --   --   --   --  0.6  PROT  --   --   --   --  6.5  ALBUMIN 2.5* 2.6* 2.5* 2.8* 2.9*    CBG: Recent Labs  Lab 03/31/20 2359 04/01/20 0408 04/01/20 0743 04/01/20 1159 04/01/20 1530  GLUCAP 191* 266* 234* 245* 202*     Recent Results (from the past 240 hour(s))  Culture, Urine     Status: Abnormal   Collection Time: 03/26/20  6:44 AM   Specimen: Urine, Random  Result Value Ref Range Status   Specimen Description  URINE, RANDOM  Final   Special Requests   Final    NONE Performed at [facility], [address] [place], [place] [address]    Culture >=100,000 COLONIES/mL YEAST (Yarithza Mink)  Final   Report Status 03/27/2020 FINAL  Final         Radiology Studies: MR BRAIN WO CONTRAST  Result Date: 03/31/2020 CLINICAL DATA:  Stroke follow-up EXAM: MRI HEAD WITHOUT CONTRAST TECHNIQUE: Diffusion-weighted images obtained axial coronal planes. COMPARISON:  MRI of the brain March 19, 2020 FINDINGS: Mild restricted diffusion within the bilateral thalami consistent with expected evolution of known infarcts. No other focus of restricted diffusion identified. IMPRESSION: 1. No new acute infarct identified. 2. Mildly restricted diffusion within the bilateral thalami consistent with expected evolution of known infarcts. Electronically Signed   By: Baldemar LenisKatyucia  De Macedo Rodrigues M.D.   On: 03/31/2020 12:07        Scheduled Meds: . amantadine  50 mg Per Tube Daily  . amLODipine  5 mg Per Tube Daily  . chlorhexidine  15 mL Mouth Rinse BID  . Chlorhexidine Gluconate Cloth  6 each Topical Daily  . clopidogrel  75 mg Per Tube Q breakfast  . feeding supplement (PROSource TF)  45 mL Per Tube TID  . free water  200 mL Per Tube Q6H  . heparin  5,000 Units Subcutaneous Q8H  . insulin aspart  0-9 Units Subcutaneous Q4H  . levothyroxine  112 mcg Per Tube QAC breakfast  . liver oil-zinc oxide   Topical BID  . mouth rinse  15 mL Mouth Rinse q12n4p  . rosuvastatin  10 mg Per Tube Daily  . sodium chloride flush  3 mL Intravenous Once  . sodium chloride flush  5 mL Intracatheter Q8H   Continuous Infusions: . sodium chloride 10 mL/hr at 03/24/20 0305  . feeding supplement (OSMOLITE 1.2 CAL) 1,000 mL (04/01/20 0037)  LOS: 14 days    Time spent: over 30 min    Lacretia Nicks, MD [facility]   To contact the attending provider between 7A-7P or the covering provider during after hours 7P-7A,  please log into the web site [email or web address] and access using universal [place] password for that web site. If you do not have the password, please call the hospital operator.  04/01/2020, 3:46 PM

## 2020-04-01 NOTE — Progress Notes (Addendum)
Stroke team goals of care discussion requested by Dr. Lowell Guitar  MR brain updated yesterday without acute findings  Husband is at bedside with patient. She keeps her eyes closed and does not respond to our verbal attempts to engage her in eye opening or speech.   We discussed her stroke diagnosis and prognosis including uncertain degree of neurologic recovery and likely extended period of dependence in a total care scenario.We also discussed complications of dysphagia, feeding tubes and immobility. He shared some highlights of their 65 years together and how much he is grieving for loss of her health. He contrasted her usual functional level by describing that they were out bowling the night before the stroke occurred. He states patient would not want to live dependent on others and would not want a life that did not allow her to be true self but feels it is a hard decision to make. He realistically understands he cannot care for her on his own. His questions were answered. I assured him we would return to answer any further questions at his request.   Vitals:   04/01/20 0432 04/01/20 0600 04/01/20 0735 04/01/20 1151  BP: (!) 164/78 (!) 165/88 (!) 165/80 (!) 159/85  Pulse: (!) 106 (!) 104 96 98  Resp: 20 (!) 24 20 20   Temp: 98.2 F (36.8 C) 99.4 F (37.4 C) 98.7 F (37.1 C) 98.6 F (37 C)  TempSrc: Axillary Axillary Oral Oral  SpO2: 100% 99% 100% (!) 76%  Weight:      Height:       CBC:  Recent Labs  Lab 03/31/20 0357 04/01/20 0257  WBC 7.4 8.2  NEUTROABS 5.1 6.0  HGB 11.3* 12.1  HCT 33.8* 36.4  MCV 98.5 98.6  PLT 283 315   Basic Metabolic Panel:  Recent Labs  Lab 03/30/20 0337 03/31/20 0357 04/01/20 0257  NA 139 137 138  K 4.7 4.7 5.2*  CL 102 96* 100  CO2 26 27 26   GLUCOSE 269* 290* 206*  BUN 33* 30* 34*  CREATININE 1.15* 1.05* 1.04*  CALCIUM 9.6 9.7 9.6  MG  --   --  1.7  PHOS 3.9  --  4.4   Lipid Panel:  No results for input(s): CHOL, TRIG, HDL, CHOLHDL, VLDL,  LDLCALC in the last 04/03/20 hours. HgbA1c:  No results for input(s): HGBA1C in the last 168 hours. Urine Drug Screen:  No results for input(s): LABOPIA, COCAINSCRNUR, LABBENZ, AMPHETMU, THCU, LABBARB in the last 168 hours.  Alcohol Level No results for input(s): ETH in the last 168 hours.  IMAGING past 24 hours No results found. PHYSICAL EXAM Physical Exam:     Frail elderly female lying in bed with eyes closed. No verbalization with voice are and tactile stimulation.  Head is nontraumatic.  Resp: Without extra work of breathing  Neurological Exam  Face appears symmetric.   Resists eye opening for exam. Appear to be at midline. Unable to assess visual fields.  Tongue midline No verbal response  Does not follow commands She moves right upper and both lower extremities purposefully but does not follow commands for formal testing.  LUE at least antigravity.   Gait not tested   ASSESSMENT/PLAN Ms. DAGMAR ADCOX is a 87 y.o. female with cerebral amyloid angiopathy, brain aneurysm, embolic stroke 2021, DM2, PVD, PE, HTN obesity who presented after sudden onset of unresponsiveness and altered mental status with possibly some increased left arm weakness of unclear etiology.    Bilateral medial thalamic and punctate left  superior cerebellar infarcts possibly from embolism to top of the basilar though involvement of artery of Percheron which may supply both medial thalamus due to congenital variant and technically is a small vessel disease could explain this presentation.  Tiny left cerebellar punctate infarct may also be a concurrent lacune.  However she has had previous history of embolic as well as small vessel infarcts in October 21 hence strong suspicion for underlying paroxysmal A. Fib.   Studies: CT head no acute abnormalities MRI bilateral medial thalamic punctate left superior cerebellar infarct CTA suboptimal due to contrast timing but no LVO.  Diminutive flow in the right vertebral  artery MR Brain 2/25 Mildly restricted diffusion within the bilateral thalami consistent with expected evolution of known infarcts. ECHO on 11/24/2019 ejection fraction 60-65%.  No wall motion abnormalities. LDL 155 mg percent HbA1c 7.6 EEG showed no seizures   LDL 155  HgbA1c 7.6  VTE prophylaxis is recommended    Diet   Diet NPO time specified   Dysphagia with cortrak tube placed for feeding and medications  On plavix prior to admission.   Goals of care discussion underway with husband/family as above. Palliative care following.   Hypertension  Hypertensive to 190 systolic this morning and consistently for past 2 days  . Permissive hypertension (OK if < 220/120) but gradually normalize in 5-7 days . Long-term BP goal normotensive  Hyperlipidemia  Home meds:  Crestor 5mg    LDL 155, goal < 70  Crestor increased to 10mg  upon admission, will hold in setting of AKI per discussion with pharmacist. Resume when appropriate.   Continue statin at discharge  Diabetes type II Uncontrolled  HgbA1c 7.6, goal < 7.0  CBGs Recent Labs    04/01/20 0408 04/01/20 0743 04/01/20 1159  GLUCAP 266* 234* 245*      SSI   Somnolence/Encephalopathy  Multifactorial. Could be related to thalamic infarcts. In light of worsening renal function and heart failure precaution will reduce Amantadine to 50mg  daily.   Ritalin dced d/t agitation a week ago  If safe from renal standpoint, agitation and blood pressure management concerns consider adding modafinil 100mg  BID    Other Stroke Risk Factors  Advanced Age >/= 67   Hx stroke/TIA  Grade 2 diastolic dysfunction   Other Active Problems  AKI: Baseline Ctn 1.0-1.3  Renal ultrasound reveals moderate to severe Hydronephrosis on the right with possible UP junction stone- Urology placed right nephrostomy tube 2/21  Acute metabolic encephalopathy - persistent  Grade 2 diastolic heart failure-chronic -2D echo during this  admission reveals grade 2 diastolic dysfunction Hospital day # 14   Neurology stroke with sign off. Please do not hesitate to reach out with questions.   , MD Page:  To contact Stroke Continuity provider, please refer to 76. After hours, contact General Neurology   Hospital day # 14   04/01/2020 2:37 PM

## 2020-04-02 DIAGNOSIS — I6322 Cerebral infarction due to unspecified occlusion or stenosis of basilar arteries: Secondary | ICD-10-CM

## 2020-04-02 DIAGNOSIS — R400 Somnolence: Secondary | ICD-10-CM

## 2020-04-02 DIAGNOSIS — R6889 Other general symptoms and signs: Secondary | ICD-10-CM

## 2020-04-02 DIAGNOSIS — I63212 Cerebral infarction due to unspecified occlusion or stenosis of left vertebral arteries: Secondary | ICD-10-CM | POA: Diagnosis not present

## 2020-04-02 DIAGNOSIS — G9340 Encephalopathy, unspecified: Secondary | ICD-10-CM | POA: Diagnosis not present

## 2020-04-02 DIAGNOSIS — Z7189 Other specified counseling: Secondary | ICD-10-CM | POA: Diagnosis not present

## 2020-04-02 DIAGNOSIS — N179 Acute kidney failure, unspecified: Secondary | ICD-10-CM | POA: Diagnosis not present

## 2020-04-02 DIAGNOSIS — Z515 Encounter for palliative care: Secondary | ICD-10-CM | POA: Diagnosis not present

## 2020-04-02 LAB — GLUCOSE, CAPILLARY
Glucose-Capillary: 145 mg/dL — ABNORMAL HIGH (ref 70–99)
Glucose-Capillary: 185 mg/dL — ABNORMAL HIGH (ref 70–99)
Glucose-Capillary: 201 mg/dL — ABNORMAL HIGH (ref 70–99)
Glucose-Capillary: 215 mg/dL — ABNORMAL HIGH (ref 70–99)
Glucose-Capillary: 236 mg/dL — ABNORMAL HIGH (ref 70–99)
Glucose-Capillary: 260 mg/dL — ABNORMAL HIGH (ref 70–99)

## 2020-04-02 LAB — COMPREHENSIVE METABOLIC PANEL
ALT: 13 U/L (ref 0–44)
AST: 17 U/L (ref 15–41)
Albumin: 2.6 g/dL — ABNORMAL LOW (ref 3.5–5.0)
Alkaline Phosphatase: 80 U/L (ref 38–126)
Anion gap: 11 (ref 5–15)
BUN: 37 mg/dL — ABNORMAL HIGH (ref 8–23)
CO2: 30 mmol/L (ref 22–32)
Calcium: 9.5 mg/dL (ref 8.9–10.3)
Chloride: 97 mmol/L — ABNORMAL LOW (ref 98–111)
Creatinine, Ser: 1.07 mg/dL — ABNORMAL HIGH (ref 0.44–1.00)
GFR, Estimated: 51 mL/min — ABNORMAL LOW (ref >60)
Glucose, Bld: 224 mg/dL — ABNORMAL HIGH (ref 70–99)
Potassium: 4.7 mmol/L (ref 3.5–5.1)
Sodium: 138 mmol/L (ref 135–145)
Total Bilirubin: 0.4 mg/dL (ref 0.3–1.2)
Total Protein: 6.4 g/dL — ABNORMAL LOW (ref 6.5–8.1)

## 2020-04-02 LAB — CBC WITH DIFFERENTIAL/PLATELET
Abs Immature Granulocytes: 0.09 10*3/uL — ABNORMAL HIGH (ref 0.00–0.07)
Basophils Absolute: 0.1 10*3/uL (ref 0.0–0.1)
Basophils Relative: 1 %
Eosinophils Absolute: 0.3 10*3/uL (ref 0.0–0.5)
Eosinophils Relative: 4 %
HCT: 35.1 % — ABNORMAL LOW (ref 36.0–46.0)
Hemoglobin: 11.4 g/dL — ABNORMAL LOW (ref 12.0–15.0)
Immature Granulocytes: 1 %
Lymphocytes Relative: 15 %
Lymphs Abs: 1 10*3/uL (ref 0.7–4.0)
MCH: 31.9 pg (ref 26.0–34.0)
MCHC: 32.5 g/dL (ref 30.0–36.0)
MCV: 98.3 fL (ref 80.0–100.0)
Monocytes Absolute: 0.7 10*3/uL (ref 0.1–1.0)
Monocytes Relative: 11 %
Neutro Abs: 4.6 10*3/uL (ref 1.7–7.7)
Neutrophils Relative %: 68 %
Platelets: 293 10*3/uL (ref 150–400)
RBC: 3.57 MIL/uL — ABNORMAL LOW (ref 3.87–5.11)
RDW: 12.7 % (ref 11.5–15.5)
WBC: 6.8 10*3/uL (ref 4.0–10.5)
nRBC: 0 % (ref 0.0–0.2)

## 2020-04-02 LAB — PHOSPHORUS: Phosphorus: 4.2 mg/dL (ref 2.5–4.6)

## 2020-04-02 LAB — MAGNESIUM: Magnesium: 1.8 mg/dL (ref 1.7–2.4)

## 2020-04-02 MED ORDER — FLUCONAZOLE 40 MG/ML PO SUSR
200.0000 mg | Freq: Once | ORAL | Status: AC
  Administered 2020-04-02: 200 mg
  Filled 2020-04-02: qty 5

## 2020-04-02 MED ORDER — FLUCONAZOLE 40 MG/ML PO SUSR
100.0000 mg | ORAL | Status: DC
  Administered 2020-04-03 – 2020-04-06 (×3): 100 mg
  Filled 2020-04-02 (×5): qty 2.5

## 2020-04-02 MED ORDER — MODAFINIL 100 MG PO TABS
100.0000 mg | ORAL_TABLET | Freq: Every day | ORAL | Status: DC
  Administered 2020-04-03: 100 mg via ORAL
  Filled 2020-04-02: qty 1

## 2020-04-02 MED ORDER — ACETAMINOPHEN 325 MG PO TABS
650.0000 mg | ORAL_TABLET | Freq: Four times a day (QID) | ORAL | Status: DC | PRN
  Administered 2020-04-02 – 2020-04-17 (×5): 650 mg via NASOGASTRIC
  Filled 2020-04-02 (×8): qty 2

## 2020-04-02 NOTE — Progress Notes (Signed)
Pharmacy Antifungal Note  Hannah Fischer is a 87 y.o. female admitted on 03/18/2020 with yeast UTI.  Pharmacy has been consulted for Diflucan dosing.  Plan: Diflucan 200mg  per tube now then 100mg  per tube daily Will f/u renal function and LOT  Height: 5\' 3"  (160 cm) Weight: 78.9 kg (174 lb) IBW/kg (Calculated) : 52.4  Temp (24hrs), Avg:98.2 F (36.8 C), Min:97.4 F (36.3 C), Max:99 F (37.2 C)  Recent Labs  Lab 03/27/20 1009 03/28/20 0325 03/29/20 0407 03/30/20 0337 03/31/20 0357 04/01/20 0257 04/02/20 0246  WBC 5.8  --   --   --  7.4 8.2 6.8  CREATININE  --    < > 1.24* 1.15* 1.05* 1.04* 1.07*   < > = values in this interval not displayed.    Estimated Creatinine Clearance: 38.2 mL/min (A) (by C-G formula based on SCr of 1.07 mg/dL (H)).    Allergies  Allergen Reactions  . Atorvastatin Other (See Comments)    "Aches"  . Ezetimibe-Simvastatin Other (See Comments)    "Aches"  . Lovastatin Other (See Comments)    Weakness  . Metformin Hcl Diarrhea  . Pravastatin Other (See Comments)    Weakness  . Codeine Anxiety and Other (See Comments)    Keeps the patient awake  . Simvastatin Nausea Only and Other (See Comments)    Muscle weakness and an stomach upset  . Sulfa Antibiotics Other (See Comments)    Gastric intolerance and caused stomach upset  . Sulfites Other (See Comments)    Abdominal pain    Antimicrobials this admission: 2/20 Rocephin >> 2/24 2/27 Diflucan >>   Microbiology results: 2/12 BCx: negative 2/21 UCx: 100kcol yeast  2/12 UCx: Group B strep (treated with Rocephin)   Thank you for allowing pharmacy to be a part of this patient's care.  4/12, PharmD, BCPS Please see amion for complete clinical pharmacist phone list 04/02/2020 5:47 PM

## 2020-04-02 NOTE — Progress Notes (Addendum)
   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  Palliative care was consulted to aid in goals of care conversations.  Today's Discussion (04/02/2020):  *Please note that this is a verbal dictation therefore any spelling or grammatical errors are due to the "[employer]agon [employer] One" system interpretation.  Chart reviewed.   I assessed Hannah Fischer this afternoon, she is not interacting with me today. She overall appears more generally ill as compared to yesterday.  I spoke to [place] who shares with me that he did speak to the Neurologist. He also expresses that Hannah Fischer has been sleeping the whole day since he came in. He shares that she is not vocal towards him today or providing acknowledgement that he is present. We reviewed that she had not made any tremendous changes towards improvement. I utilized therapeutic silence during this time as this is very clearly a difficult time for Hannah Fischer. I shared with him that I will be present all throughout today and tomorrow if he has any questions or concerns.  Plan for family meeting at [facility] tomorrow to determine next steps.  Objective Assessment: Vital Signs Vitals:   04/02/20 1159 04/02/20 1400  BP: 120/60 115/77  Pulse: 78 72  Resp: 16 20  Temp: 98 F (36.7 C) 97.9 F (36.6 C)  SpO2: 100%     Intake/Output Summary (Last 24 hours) at 04/02/2020 1604 Last data filed at 04/02/2020 1300 Gross per 24 hour  Intake 995 ml  Output 1050 ml  Net -55 ml   Last Weight  Most recent update: 03/30/2020  8:30 AM   Weight  78.9 kg (174 lb)            Gen:  Elderly F in NAD HEENT:Coretrack in place, moist mucous membranes CV: Irregular rate and rhythm  PULM: RA ABD: soft/nontender  EXT: No edema  Neuro:  Somnolent can follow basic commands though does not open eyes or speak  SUMMARY OF RECOMMENDATIONS DNAR/DNI  Continue time trial over the weekend  Plan for another family meeting on Monday at 5PM  Ongoing PMT support  Time Spent: 15 Greater than 50% of the time was spent in counseling and coordination of care ______________________________________________________________________________________ Lamarr Lulas Grady Palliative Medicine Team Team Cell Phone: [phone] Please utilize secure chat with additional questions, if there is no response within 30 minutes please call the above phone number  Palliative Medicine Team providers are available by phone from 7am to 7pm daily and can be reached through the team cell phone.  Should this patient require assistance outside of these hours, please call the patient's attending physician.

## 2020-04-02 NOTE — Progress Notes (Addendum)
PROGRESS NOTE    Hannah Fischer  HWE:[id number] DOB: 04-May-1932 DOA: 03/18/2020 PCP: Sigmund Hazel, MD   Chief Complaint  Patient presents with  . 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  At that she had gone to bed last night and in the morning he had trouble waking her up. In the ED she was noted to be responsive to pain.  Hannah Fischer code stroke was called.  She was outside the window for TPA.  An emergent CT did not show any acute abnormality.  Hannah Fischer CTA was performed and did not reveal any obvious stenosis. An MRI of the brain was performed on 2/13:Punctate acute infarction in the left superior cerebellumadjacent to the superior cerebellar peduncle. Bilateral thalamic acute infarction extending back into the mid brain. This is consistent with artery of Percheron infarction.  Assessment & Plan:   Principal Problem:   Acute encephalopathy Active Problems:   Diabetes mellitus (HCC)   Hypothyroidism   History of embolic stroke  Goals of Care: GOC discussion with family today.  Appreciate palliative.  Planning to ask stroke team to reevaluate and comment on prognosis - appreciate assistance, per Dr. Thomasena Edis, will try to have stroke neurologist attend family discussion.  Plan for time limited trial over weekend and repeat meeting on Monday.  Discussed though limited as this is my first time meeting her, I suspect her prognosis for recovery is poor.   See palliative care note DNR/DNI  Bilateral Thalamic Infarcts Acute metabolic encephalopathy -Prior history of embolic infarcts -The patient currently has an NG tube  -Neurology and the stroke team have seen her -Started on amantadine (ritalin d/c'd) - (per neuro, consider modafinil - plan for trial per neurology)  -Due to her lethargy,  she has Hannah Fischer cor track and is receiving Osmolite - further plans for PEG vs comfort care pending additional GOC discussions   - asymmetric pupils -> repeat MRI 2/25 with no new acute infarct, expected evolution of known infarcts - therapy exam limited by lethargy 2/25 (see note) - EEG 2/13 with moderate diffuse encephalopathy  Intermittent agitation - waking up from her brain injury and agitated - will liberalize visitation so family can help sit with her  CKD 3A- AKI due to dehydration  Right Sided Hydronephrosis with UPJ stone -Baseline creatinine appears to be 1.0-1.3 -started free water per tube and watching I & O closely to prevent heart failure exacerbation - BUN & Cr continued to rise despite adequate hydration - 2/19>- Cr rose to 2.15- Ordered Hannah Fischer renal ultrasound which showed right sided hydronephrosis and nephrolithiasis with Hannah Fischer UPJ stone -  consulted nephrology and urology-  -  IR placed Hannah Fischer right nephrostomy tube on 2/21 - renal function now improving, continue to monitor  UTI: GBS UTI from 2/12 appears to have been treated with ceftriaxone.  2/20 yeast UTI, will start fluconazole.  Afebrile, normal WBC count.  Follow.   Grade 2 diastolic heart failure-chronic -2D echo performed during this admission reveals grade 2 diastolic dysfunction - following fluid status closely  Gluteal skin breakdown - have ordered [place] and barrier cream - will switch to an air mattress to prevent further skin breakdown- Due to NG tube and risk for aspiration, it is difficult to turn her in the bed Q 4 hrs     Diabetes mellitus  2 -Continue on insulin sliding scale every 4 hours while she is on tube feeds -basal insulin   Hypothyroidism -Continue Synthroid per tube  Hyperkalemia Mild, follow  DVT prophylaxis: heparin  Code Status: DNR Family Communication: husband at bedside Disposition:   Status is: Inpatient  Remains inpatient appropriate because:Inpatient level of care appropriate  due to severity of illness   Dispo: The patient is from: Home              Anticipated d/c is to: Home              Patient currently is not medically stable to d/c.   Difficult to place patient No       Consultants:   Neurology  Palliative  PCCM  Nephrology  IR  urology  Procedures:  EEG IMPRESSION: This study is suggestive of moderate diffuse encephalopathy, nonspecific etiology. No seizures or epileptiform discharges were seen throughout the recording.  2/21  EXAM: 1. ULTRASOUND GUIDANCE FOR PUNCTURE OF THE RIGHT RENAL COLLECTING SYSTEM 2. RIGHT PERCUTANEOUS NEPHROSTOMY TUBE PLACEMENT.  Antimicrobials: Anti-infectives (From admission, onward)   Start     Dose/Rate Route Frequency Ordered Stop   03/26/20 0730  cefTRIAXone (ROCEPHIN) 1 g in sodium chloride 0.9 % 100 mL IVPB        1 g 200 mL/hr over 30 Minutes Intravenous Every 24 hours 03/26/20 0644 03/30/20 0844   03/18/20 1900  azithromycin (ZITHROMAX) 500 mg in sodium chloride 0.9 % 250 mL IVPB  Status:  Discontinued        500 mg 250 mL/hr over 60 Minutes Intravenous Every 24 hours 03/18/20 1830 03/19/20 1106   03/18/20 1900  cefTRIAXone (ROCEPHIN) 2 g in sodium chloride 0.9 % 100 mL IVPB  Status:  Discontinued        2 g 200 mL/hr over 30 Minutes Intravenous Every 24 hours 03/18/20 1830 03/19/20 1106     Subjective: Unresponsive  Objective: Vitals:   04/02/20 0829 04/02/20 1159 04/02/20 1400 04/02/20 1611  BP: 132/83 120/60 115/77 135/67  Pulse: 89 78 72 85  Resp: 20 16 20  (!) 30  Temp: (!) 97.4 F (36.3 C) 98 F (36.7 C) 97.9 F (36.6 C) 98.7 F (37.1 C)  TempSrc: Oral Oral Axillary Oral  SpO2: 100% 100%  99%  Weight:      Height:        Intake/Output Summary (Last 24 hours) at 04/02/2020 1650 Last data filed at 04/02/2020 1600 Gross per 24 hour  Intake 3975 ml  Output 1500 ml  Net 2475 ml   Filed Weights   03/28/20 0500 03/29/20 0342 03/30/20 0309  Weight: 108.2 kg 107.9 kg 78.9  kg    Examination:  General: No acute distress. Cardiovascular: Heart sounds show Hannah Fischer regular rate, and rhythm Lungs: Clear to auscultation bilaterally Abdomen: Soft, nontender, nondistended Neurological: doesn't meaningfully follow commands or respond to verbal stimuli today.  Moves all extremites. Skin: Warm and dry. No rashes or lesions. Extremities: No clubbing or cyanosis. No edema  Data Reviewed: I have personally reviewed following labs and imaging studies  CBC: Recent Labs  Lab 03/27/20 1009 03/31/20 0357 04/01/20 0257 04/02/20 0246  WBC 5.8 7.4 8.2 6.8  NEUTROABS  --  5.1 6.0 4.6  HGB 11.7* 11.3* 12.1 11.4*  HCT 35.9* 33.8* 36.4 35.1*  MCV 101.1* 98.5 98.6 98.3  PLT 222 283 315 293    Basic Metabolic Panel: Recent Labs  Lab 03/28/20 0325 03/29/20 0407 03/30/20 0337 03/31/20 0357 04/01/20  0257 04/02/20 0246  NA 142 140 139 137 138 138  K 4.5 4.8 4.7 4.7 5.2* 4.7  CL 102 102 102 96* 100 97*  CO2 26 28 26 27 26 30   GLUCOSE 178* 197* 269* 290* 206* 224*  BUN 51* 36* 33* 30* 34* 37*  CREATININE 1.77* 1.24* 1.15* 1.05* 1.04* 1.07*  CALCIUM 9.3 9.1 9.6 9.7 9.6 9.5  MG  --   --   --   --  1.7 1.8  PHOS 5.0* 4.0 3.9  --  4.4 4.2    GFR: Estimated Creatinine Clearance: 38.2 mL/min (Hannah Fischer) (by C-G formula based on SCr of 1.07 mg/dL (H)).  Liver Function Tests: Recent Labs  Lab 03/28/20 0325 03/29/20 0407 03/30/20 0337 04/01/20 0257 04/02/20 0246  AST  --   --   --  20 17  ALT  --   --   --  13 13  ALKPHOS  --   --   --  75 80  BILITOT  --   --   --  0.6 0.4  PROT  --   --   --  6.5 6.4*  ALBUMIN 2.6* 2.5* 2.8* 2.9* 2.6*    CBG: Recent Labs  Lab 04/01/20 2343 04/02/20 0357 04/02/20 0826 04/02/20 1156 04/02/20 1519  GLUCAP 193* 201* 185* 236* 260*     Recent Results (from the past 240 hour(s))  Culture, Urine     Status: Abnormal   Collection Time: 03/26/20  6:44 AM   Specimen: Urine, Random  Result Value Ref Range Status   Specimen  Description URINE, RANDOM  Final   Special Requests   Final    NONE Performed at [facility], [address] [place], [place]    Culture >=100,000 COLONIES/mL YEAST (Hannah Fischer)  Final   Report Status 03/27/2020 FINAL  Final         Radiology Studies: No results found.      Scheduled Meds: . amantadine  50 mg Per Tube Daily  . amLODipine  5 mg Per Tube Daily  . chlorhexidine  15 mL Mouth Rinse BID  . Chlorhexidine Gluconate Cloth  6 each Topical Daily  . clopidogrel  75 mg Per Tube Q breakfast  . feeding supplement (PROSource TF)  45 mL Per Tube TID  . free water  200 mL Per Tube Q6H  . heparin  5,000 Units Subcutaneous Q8H  . insulin aspart  0-9 Units Subcutaneous Q4H  . insulin detemir  5 Units Subcutaneous Daily  . levothyroxine  112 mcg Per Tube QAC breakfast  . liver oil-zinc oxide   Topical BID  . mouth rinse  15 mL Mouth Rinse q12n4p  . [START ON 04/03/2020] modafinil  100 mg Oral Daily  . rosuvastatin  10 mg Per Tube Daily  . sodium chloride flush  3 mL Intravenous Once  . sodium chloride flush  5 mL Intracatheter Q8H   Continuous Infusions: . sodium chloride 10 mL/hr at 03/24/20 0305  . feeding supplement (OSMOLITE 1.2 CAL) 45 mL/hr at 04/02/20 04/04/20     LOS: 15 days    Time spent: over 30 min    6295, MD [facility]   To contact the attending provider between 7A-7P or the covering provider during after hours 7P-7A, please log into the web site [email or web address] and access using universal [place] password for that web site. If you do not have the password, please call the hospital operator.  04/02/2020, 4:50 PM

## 2020-04-02 NOTE — Progress Notes (Signed)
   04/02/20 1800  Assess: MEWS Score  Temp 98.8 F (37.1 C)  BP 132/67  Pulse Rate 82  ECG Heart Rate (!) 101  Resp 20  Level of Consciousness Responds to Pain  SpO2 98 %  O2 Device Room Air  O2 Flow Rate (L/min) 2 L/min  Assess: MEWS Score  MEWS Temp 0  MEWS Systolic 0  MEWS Pulse 1  MEWS RR 0  MEWS LOC 2  MEWS Score 3  MEWS Score Color Yellow  Treat  Pain Scale Faces  Pain Score 0  Take Vital Signs  Increase Vital Sign Frequency  Yellow: Q 2hr X 2 then Q 4hr X 2, if remains yellow, continue Q 4hrs  Escalate  MEWS: Escalate Yellow: discuss with charge nurse/RN and consider discussing with provider and RRT  Notify: Charge Nurse/RN  Name of Charge Nurse/RN Notified Marylene Land  Date Charge Nurse/RN Notified 04/02/20  Time Charge Nurse/RN Notified 1845

## 2020-04-02 NOTE — Progress Notes (Signed)
Patient Hannah Fischer  Patient was a YELLOW MEWS score, now has increased to a RED MEWS score due to an increase in temperature. Patient responds to voice (more of her baseline) but is otherwise lethargic/sleepy.  MD ordered and advised for PRN Tylenol to treat fever. Patient has had waxing and waning vital signs for a the past few days and nights. Her HR sometimes becomes tachycardic and RR becomes tachypneic.      04/02/20 2019  Assess: MEWS Score  Temp (!) 100.7 F (38.2 C)  BP (!) 153/82  Pulse Rate (!) 108  Resp (!) 26  Level of Consciousness Responds to Voice  SpO2 100 %  O2 Device Nasal Cannula  Patient Activity (if Appropriate) In bed  O2 Flow Rate (L/min) 2 L/min  Assess: MEWS Score  MEWS Temp 1  MEWS Systolic 0  MEWS Pulse 1  MEWS RR 2  MEWS LOC 1  MEWS Score 5  MEWS Score Color Red  Assess: if the MEWS score is Yellow or Red  Were vital signs taken at a resting state? Yes  Focused Assessment No change from prior assessment  Early Detection of Sepsis Score *See Row Information* High  MEWS guidelines implemented *See Row Information* Yes  Treat  Pain Scale Faces  Faces Pain Scale 2  Pain Intervention(s) Repositioned;Medication (See eMAR)  Take Vital Signs  Increase Vital Sign Frequency  Red: Q 1hr X 4 then Q 4hr X 4, if remains red, continue Q 4hrs  Escalate  MEWS: Escalate Red: discuss with charge nurse/RN and provider, consider discussing with RRT  Notify: Charge Nurse/RN  Name of Charge Nurse/RN Notified Raymond, RN  Date Charge Nurse/RN Notified 04/02/20  Time Charge Nurse/RN Notified 2021  Notify: Provider  Provider Name/Title B. Chotiner, MD  Date Provider Notified 04/02/20  Time Provider Notified 2031  Notification Type Page  Notification Reason Change in status  Provider response See new orders  Date of Provider Response 04/02/20  Time of Provider Response 2036

## 2020-04-02 NOTE — Plan of Care (Signed)
  Problem: Education: Goal: Knowledge of General Education information will improve Description: Including pain rating scale, medication(s)/side effects and non-pharmacologic comfort measures Outcome: Not Progressing   

## 2020-04-02 NOTE — Progress Notes (Signed)
   04/02/20 1400  Assess: MEWS Score  Temp 97.9 F (36.6 C)  BP 115/77  Pulse Rate 72  ECG Heart Rate 77  Resp 20  Level of Consciousness Responds to Pain  Assess: MEWS Score  MEWS Temp 0  MEWS Systolic 0  MEWS Pulse 0  MEWS RR 0  MEWS LOC 2  MEWS Score 2  MEWS Score Color Yellow  Assess: if the MEWS score is Yellow or Red  Were vital signs taken at a resting state? Yes  Focused Assessment Change from prior assessment (see assessment flowsheet)  Early Detection of Sepsis Score *See Row Information* Low  MEWS guidelines implemented *See Row Information* No, previously yellow, continue vital signs every 4 hours  Treat  Pain Scale Faces  Pain Score 0  Take Vital Signs  Increase Vital Sign Frequency  Yellow: Q 2hr X 2 then Q 4hr X 2, if remains yellow, continue Q 4hrs  Escalate  MEWS: Escalate Yellow: discuss with charge nurse/RN and consider discussing with provider and RRT  Notify: Charge Nurse/RN  Name of Charge Nurse/RN Notified Marylene Land  Date Charge Nurse/RN Notified 04/01/20  Time Charge Nurse/RN Notified 1405

## 2020-04-02 NOTE — Progress Notes (Addendum)
Brief Stroke Progress Note  Had long discussion with husband regarding the bilateral thalamic strokes and the uniquely prolonged recovery time with unpredictable functional recovery. It has only been two weeks and at this point it is impossible predict residual effects. Per report, methylphenidate was attempted and though there may have been some effect, the patient became agitated. Discussed a trial of modafinil and placed order for 100mg  tomorrow morning 0700, and if there is benefit and if tolerated, increase to 200mg  every morning.  The husband stated that there will be a family discussion on 04/03/2020 at 1700 and requested stroke neurology to attend.  , MD Page: 04/05/2020

## 2020-04-02 NOTE — Progress Notes (Signed)
   04/02/20 1159  Assess: MEWS Score  Temp 98 F (36.7 C)  BP 120/60  Pulse Rate 78  Resp 16  Level of Consciousness Responds to Pain  SpO2 100 %  O2 Device Nasal Cannula  O2 Flow Rate (L/min) 2 L/min  Assess: MEWS Score  MEWS Temp 0  MEWS Systolic 0  MEWS Pulse 0  MEWS RR 0  MEWS LOC 2  MEWS Score 2  MEWS Score Color Yellow  Assess: if the MEWS score is Yellow or Red  Were vital signs taken at a resting state? Yes  Focused Assessment No change from prior assessment  Early Detection of Sepsis Score *See Row Information* Low  MEWS guidelines implemented *See Row Information* No, altered LOC is baseline  Treat  Pain Score Asleep  Pain Intervention(s) Repositioned  Take Vital Signs  Increase Vital Sign Frequency  Yellow: Q 2hr X 2 then Q 4hr X 2, if remains yellow, continue Q 4hrs  Escalate  MEWS: Escalate Yellow: discuss with charge nurse/RN and consider discussing with provider and RRT  Notify: Charge Nurse/RN  Name of Charge Nurse/RN Notified Marylene Land  Date Charge Nurse/RN Notified 04/01/20

## 2020-04-02 NOTE — Progress Notes (Signed)
   04/02/20 1611  Assess: MEWS Score  Temp 98.7 F (37.1 C)  BP 135/67  Pulse Rate 85  Resp (!) 30  Level of Consciousness Responds to Voice  SpO2 99 %  O2 Device Room Air  Assess: MEWS Score  MEWS Temp 0  MEWS Systolic 0  MEWS Pulse 0  MEWS RR 2  MEWS LOC 1  MEWS Score 3  MEWS Score Color Yellow  Assess: if the MEWS score is Yellow or Red  Were vital signs taken at a resting state? Yes  Focused Assessment No change from prior assessment  Early Detection of Sepsis Score *See Row Information* Low  MEWS guidelines implemented *See Row Information* No, previously yellow, continue vital signs every 4 hours  Treat  Pain Scale Faces  Pain Score 0  Take Vital Signs  Increase Vital Sign Frequency  Yellow: Q 2hr X 2 then Q 4hr X 2, if remains yellow, continue Q 4hrs  Escalate  MEWS: Escalate Yellow: discuss with charge nurse/RN and consider discussing with provider and RRT  Notify: Charge Nurse/RN  Name of Charge Nurse/RN Notified Marylene Land  Date Charge Nurse/RN Notified 04/02/20  Time Charge Nurse/RN Notified 1708

## 2020-04-03 ENCOUNTER — Inpatient Hospital Stay (HOSPITAL_COMMUNITY): Payer: Medicare Other

## 2020-04-03 DIAGNOSIS — G9340 Encephalopathy, unspecified: Secondary | ICD-10-CM | POA: Diagnosis not present

## 2020-04-03 DIAGNOSIS — I63333 Cerebral infarction due to thrombosis of bilateral posterior cerebral arteries: Secondary | ICD-10-CM

## 2020-04-03 DIAGNOSIS — R4182 Altered mental status, unspecified: Secondary | ICD-10-CM | POA: Diagnosis not present

## 2020-04-03 DIAGNOSIS — R2990 Unspecified symptoms and signs involving the nervous system: Secondary | ICD-10-CM

## 2020-04-03 DIAGNOSIS — Z515 Encounter for palliative care: Secondary | ICD-10-CM | POA: Diagnosis not present

## 2020-04-03 DIAGNOSIS — N179 Acute kidney failure, unspecified: Secondary | ICD-10-CM | POA: Diagnosis not present

## 2020-04-03 DIAGNOSIS — Z7189 Other specified counseling: Secondary | ICD-10-CM | POA: Diagnosis not present

## 2020-04-03 DIAGNOSIS — E1149 Type 2 diabetes mellitus with other diabetic neurological complication: Secondary | ICD-10-CM

## 2020-04-03 DIAGNOSIS — R4189 Other symptoms and signs involving cognitive functions and awareness: Secondary | ICD-10-CM | POA: Diagnosis not present

## 2020-04-03 LAB — CBC WITH DIFFERENTIAL/PLATELET
Abs Immature Granulocytes: 0.1 10*3/uL — ABNORMAL HIGH (ref 0.00–0.07)
Basophils Absolute: 0 10*3/uL (ref 0.0–0.1)
Basophils Relative: 1 %
Eosinophils Absolute: 0.3 10*3/uL (ref 0.0–0.5)
Eosinophils Relative: 5 %
HCT: 37.6 % (ref 36.0–46.0)
Hemoglobin: 12.2 g/dL (ref 12.0–15.0)
Immature Granulocytes: 2 %
Lymphocytes Relative: 18 %
Lymphs Abs: 1.2 10*3/uL (ref 0.7–4.0)
MCH: 32.2 pg (ref 26.0–34.0)
MCHC: 32.4 g/dL (ref 30.0–36.0)
MCV: 99.2 fL (ref 80.0–100.0)
Monocytes Absolute: 0.6 10*3/uL (ref 0.1–1.0)
Monocytes Relative: 10 %
Neutro Abs: 4.3 10*3/uL (ref 1.7–7.7)
Neutrophils Relative %: 64 %
Platelets: 290 10*3/uL (ref 150–400)
RBC: 3.79 MIL/uL — ABNORMAL LOW (ref 3.87–5.11)
RDW: 12.9 % (ref 11.5–15.5)
WBC: 6.5 10*3/uL (ref 4.0–10.5)
nRBC: 0 % (ref 0.0–0.2)

## 2020-04-03 LAB — COMPREHENSIVE METABOLIC PANEL
ALT: 13 U/L (ref 0–44)
AST: 19 U/L (ref 15–41)
Albumin: 2.7 g/dL — ABNORMAL LOW (ref 3.5–5.0)
Alkaline Phosphatase: 86 U/L (ref 38–126)
Anion gap: 11 (ref 5–15)
BUN: 45 mg/dL — ABNORMAL HIGH (ref 8–23)
CO2: 27 mmol/L (ref 22–32)
Calcium: 9.6 mg/dL (ref 8.9–10.3)
Chloride: 98 mmol/L (ref 98–111)
Creatinine, Ser: 1.13 mg/dL — ABNORMAL HIGH (ref 0.44–1.00)
GFR, Estimated: 48 mL/min — ABNORMAL LOW (ref >60)
Glucose, Bld: 267 mg/dL — ABNORMAL HIGH (ref 70–99)
Potassium: 5.1 mmol/L (ref 3.5–5.1)
Sodium: 136 mmol/L (ref 135–145)
Total Bilirubin: 0.6 mg/dL (ref 0.3–1.2)
Total Protein: 6.5 g/dL (ref 6.5–8.1)

## 2020-04-03 LAB — GLUCOSE, CAPILLARY
Glucose-Capillary: 244 mg/dL — ABNORMAL HIGH (ref 70–99)
Glucose-Capillary: 224 mg/dL — ABNORMAL HIGH (ref 70–99)
Glucose-Capillary: 231 mg/dL — ABNORMAL HIGH (ref 70–99)
Glucose-Capillary: 267 mg/dL — ABNORMAL HIGH (ref 70–99)
Glucose-Capillary: 185 mg/dL — ABNORMAL HIGH (ref 70–99)

## 2020-04-03 LAB — PHOSPHORUS: Phosphorus: 4.9 mg/dL — ABNORMAL HIGH (ref 2.5–4.6)

## 2020-04-03 LAB — MAGNESIUM: Magnesium: 2 mg/dL (ref 1.7–2.4)

## 2020-04-03 MED ORDER — MODAFINIL 100 MG PO TABS
200.0000 mg | ORAL_TABLET | Freq: Every day | ORAL | Status: DC
  Administered 2020-04-04: 200 mg via ORAL
  Filled 2020-04-03: qty 2

## 2020-04-03 MED ORDER — INSULIN ASPART 100 UNIT/ML ~~LOC~~ SOLN
2.0000 [IU] | SUBCUTANEOUS | Status: DC
  Administered 2020-04-03 – 2020-05-01 (×154): 2 [IU] via SUBCUTANEOUS

## 2020-04-03 NOTE — Progress Notes (Signed)
Patient is lying in bed comfortably.  Her husband arrived during rounds.  Patient is more arousable and alert today and interactive.  She follows simple midline commands and speaks a few words and repeats short sentences.  She has been started on Provigil this morning.  Plan is to have a goals of care meeting with family today evening.  Vital signs stable.  Vitals:   04/03/20 0404 04/03/20 0723 04/03/20 1024 04/03/20 1135  BP: 123/72 140/84 138/70 (!) 141/78  Pulse: 87 90  92  Resp: 19 20  20   Temp: 98.2 F (36.8 C) 99.1 F (37.3 C)  99.2 F (37.3 C)  TempSrc: Oral Oral  Oral  SpO2: 100% 100%  98%  Weight:      Height:       CBC:  Recent Labs  Lab 04/02/20 0246 04/03/20 0135  WBC 6.8 6.5  NEUTROABS 4.6 4.3  HGB 11.4* 12.2  HCT 35.1* 37.6  MCV 98.3 99.2  PLT 293 290   Basic Metabolic Panel:  Recent Labs  Lab 04/02/20 0246 04/03/20 0135  NA 138 136  K 4.7 5.1  CL 97* 98  CO2 30 27  GLUCOSE 224* 267*  BUN 37* 45*  CREATININE 1.07* 1.13*  CALCIUM 9.5 9.6  MG 1.8 2.0  PHOS 4.2 4.9*   Lipid Panel:  No results for input(s): CHOL, TRIG, HDL, CHOLHDL, VLDL, LDLCALC in the last 168 hours. HgbA1c:  No results for input(s): HGBA1C in the last 168 hours. Urine Drug Screen:  No results for input(s): LABOPIA, COCAINSCRNUR, LABBENZ, AMPHETMU, THCU, LABBARB in the last 168 hours.  Alcohol Level No results for input(s): ETH in the last 168 hours.  IMAGING past 24 hours DG CHEST PORT 1 VIEW  Result Date: 04/03/2020 CLINICAL DATA:  Fever EXAM: PORTABLE CHEST 1 VIEW COMPARISON:  03/18/2020 FINDINGS: Feeding tube has been placed with the tip in the stomach. Bibasilar opacities, favor atelectasis. Heart is borderline in size. No effusions or pneumothorax. Bilateral shoulder replacements. No acute bony abnormality. IMPRESSION: Bibasilar opacities, favor atelectasis. Electronically Signed   By: 05/16/2020 M.D.   On: 04/03/2020 08:26   PHYSICAL EXAM Physical Exam:     Frail elderly  female lying in bed with eyes closed. No verbalization with voice are and tactile stimulation.  Head is nontraumatic.  Resp: Without extra work of breathing  Neurological Exam  Patient is sleepy but can be aroused and opens eyes but will not keep them open for long.  Speech is at times difficult to understand and slurred occasional words and short sentences can be heard.  She can answer her name and recognize her husband.  She follows simple midline few one-step commands Face appears symmetric.    Tongue midline She moves right upper and both lower extremities purposefully but does not follow commands for formal testing.  Moves left upper extremity less than the right lung.  Gait not tested   ASSESSMENT/PLAN Ms. JOHNICA ARMWOOD is a 87 y.o. female with cerebral amyloid angiopathy, brain aneurysm, embolic stroke 2021, DM2, PVD, PE, HTN obesity who presented after sudden onset of unresponsiveness and altered mental status with possibly some increased left arm weakness of unclear etiology.    Bilateral medial thalamic and punctate left superior cerebellar infarcts possibly from embolism to top of the basilar though involvement of artery of Percheron which may supply both medial thalamus due to congenital variant and technically is a small vessel disease could explain this presentation.  Tiny left cerebellar punctate  infarct may also be a concurrent lacune.  However she has had previous history of embolic as well as small vessel infarcts in October 21 hence strong suspicion for underlying paroxysmal A. Fib.   Studies: CT head no acute abnormalities MRI bilateral medial thalamic punctate left superior cerebellar infarct CTA suboptimal due to contrast timing but no LVO.  Diminutive flow in the right vertebral artery MR Brain 2/25 Mildly restricted diffusion within the bilateral thalami consistent with expected evolution of known infarcts. ECHO on 11/24/2019 ejection fraction 60-65%.  No wall motion  abnormalities. LDL 155 mg percent HbA1c 7.6 EEG showed no seizures   LDL 155  HgbA1c 7.6  VTE prophylaxis is recommended    Diet   Diet NPO time specified   Dysphagia with cortrak tube placed for feeding and medications  On plavix prior to admission.   Goals of care discussion underway with husband/family as above. Palliative care following.    Hypertension  Hypertensive to 190 systolic this morning and consistently for past 2 days  . Permissive hypertension (OK if < 220/120) but gradually normalize in 5-7 days . Long-term BP goal normotensive  Hyperlipidemia  Home meds:  Crestor 5mg    LDL 155, goal < 70  Crestor increased to 10mg  upon admission, will hold in setting of AKI per discussion with pharmacist. Resume when appropriate.   Continue statin at discharge  Diabetes type II Uncontrolled  HgbA1c 7.6, goal < 7.0  CBGs Recent Labs    04/03/20 0405 04/03/20 0747 04/03/20 1131  GLUCAP 244* 224* 231*      SSI   Somnolence/Encephalopathy  Multifactorial. Could be related to thalamic infarcts. In light of worsening renal function and heart failure precaution will reduce Amantadine to 50mg  daily.   Ritalin dced d/t agitation a week ago  If safe from renal standpoint, agitation and blood pressure management concerns consider adding modafinil 100mg  BID    Other Stroke Risk Factors  Advanced Age >/= 30   Hx stroke/TIA  Grade 2 diastolic dysfunction   Other Active Problems  AKI: Baseline Ctn 1.0-1.3  Renal ultrasound reveals moderate to severe Hydronephrosis on the right with possible UP junction stone- Urology placed right nephrostomy tube 2/21  Acute metabolic encephalopathy - persistent  Grade 2 diastolic heart failure-chronic -2D echo during this admission reveals grade 2 diastolic dysfunction Hospital day # 16  Continue Provigil 100 mg daily for wakefulness and if no further improvement in the next couple of days try increasing the  dose to 200 mg daily.  Continue ongoing therapies.  Long discussion with husband at the bedside and with Dr. and answered questions.  May need to consider PEG tube if family wants to pursue aggressive care and the mental status and swallowing if not improved in the next few days.  Greater than 50% time during this time by minute visit was spent in counseling and coordination of care and discussion with patient, husband and Dr. answering questions.    76, MD To contact Stroke Continuity provider, please refer to 3/21. After hours, contact General Neurology   Hospital day # 16   04/03/2020 2:22 PM

## 2020-04-03 NOTE — Progress Notes (Signed)
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  Palliative care was consulted to aid in goals of care conversations.  Today's Discussion (04/03/2020):  *Please note that this is a verbal dictation therefore any spelling or grammatical errors are due to the "[place] One" system interpretation.  Chart reviewed.   I spoke to patients bedside RN, Minette Brine. She shares that Aeron has been more alert today. She has been able to appropriately orient x2-3 and follow directions.  Upon my assessment with Joceline she was far more alert today and able to interact with me.   I met with Romie Minus at bedside and she does appear to be more responsive after having been initiated on modafinil. Her husband [name] is present. [name] shares with me that is will not be ready to make any decisions this evening regarding comfort care. I told him that we are meeting to all touch base with one another and that there are no expectations of the meeting this evening. He appeared relieved by this. I told [name] that I plan to check in during our meeting time at [place]. _______________________________________________________________________ Family Meeting: 04/03/2020  Participants: [name] (spouse), [name] (son), Lynelle Smoke (daughter), Ivin Booty (daughter in law)  Providers: Dr. Florene Glen, Dr. [name] (neurology) Tacey Ruiz, [facility]  Discussion (04/03/2020): Dr. [name] started the family meeting discussing the types of strokes that Almetta endured which target the sleep center of her brain. He stated that she  is showing improvement as of today with the Provigil. He was honest with Sher's family in regards to the reality that she will require rehabilitation from acute hospitalization which will be a long road. He shares that when older patients endure strokes their recovery can be slow and they often reach a new baseline level of functioning. In regards to nutrition he recommends waiting for another couple of days to see if Iasia would be able to participate in a swallow evaluation. If she is unable to do so then we may need to consider gastrostomy tube placement if this is desired by Chenise's family. He shares to be cautiously optimistic.  Dr. Florene Glen was able to provide Jeans family a medical update. Her husband [name] asked about the patients urostomy bag. Dr. Bjorn Pippin was able to review urology notes and discuss Richell's hydronephrosis in greater detail with him. Dr. Florene Glen shared that other than the Provigil being initiated Shaneisha had a fever overnight which has since resolved. He shared that he worked this up though as the present time is holding off on antibiotics. He did start an antifungal for yeast which was prior identified in the urine prior. Dr. Florene Glen as well shared that today is the most alert that he has seen Ahmira. We reviewed the plan for the next couple of days. We again discussed the idea of a Gastrostomy tube with at this juncture it does seem [name] is very much in favor of if Ruie continues to be as she is presently. He does confirm this in conversation.   Plan for the time being is to continue to allow time to see if Catheleen can recover well enough to swallow on her own. Additionally  ongoing efforts by PT/OT to assess potential readiness for rehabilitation which will surely be needed if she continues to remain stable.  Objective Assessment: Vital Signs Vitals:   04/03/20 1135 04/03/20 1608  BP: (!) 141/78 (!) 158/80  Pulse: 92 98  Resp: 20 20  Temp: 99.2 F (37.3 C) 99.3 F (37.4 C)  SpO2: 98%  99%    Intake/Output Summary (Last 24 hours) at 04/03/2020 1622 Last data filed at 04/03/2020 1000 Gross per 24 hour  Intake --  Output 425 ml  Net -425 ml   Last Weight  Most recent update: 03/30/2020  8:30 AM   Weight  78.9 kg (174 lb)           Gen:  Elderly F in NAD HEENT: Coretrack in place, moist mucous membranes CV: Irregular rate and rhythm  PULM: 2LPM [place] ABD: soft/nontender  EXT: No edema  Neuro:  Awake this afternoon, able to provide basic responses and follow some commands  SUMMARY OF RECOMMENDATIONS DNAR/DNI  Continue present level of care at this time  Speech therapy for swallow evaluation  Ongoing conversations --> if a PEG is needed as of today [name] is leaning towards this  Ongoing PMT support I will not be back on service until Friday but if additional aid is needed please call our team phone number at [phone]  Time Spent:  60 minutes Greater than 50% of the time was spent in counseling and coordination of care ______________________________________________________________________________________ [place] Team Team Cell Phone: [phone] Please utilize secure chat with additional questions, if there is no response within 30 minutes please call the above phone number  Palliative Medicine Team providers are available by phone from 7am to 7pm daily and can be reached through the team cell phone.  Should this patient require assistance outside of these hours, please call the patient's attending physician.

## 2020-04-03 NOTE — Progress Notes (Signed)
PROGRESS NOTE    NAKIYA RALLIS  WJX:[id number] DOB: [birth year] DOA: 03/18/2020 PCP: Sigmund Hazel, MD   Chief Complaint  Patient presents with  . 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  At that she had gone to bed last night and in the morning he had trouble waking her up. In the ED she was noted to be responsive to pain.  Layia Walla code stroke was called.  She was outside the window for TPA.  An emergent CT did not show any acute abnormality.  Tyreke Kaeser CTA was performed and did not reveal any obvious stenosis. An MRI of the brain was performed on 2/13:Punctate acute infarction in the left superior cerebellumadjacent to the superior cerebellar peduncle. Bilateral thalamic acute infarction extending back into the mid brain. This is consistent with artery of Percheron infarction.  Assessment & Plan:   Principal Problem:   Acute encephalopathy Active Problems:   Diabetes mellitus (HCC)   Hypothyroidism   History of embolic stroke  Goals of Care: GOC discussion with family 2/25.  Appreciate palliative.  Planning to ask stroke team to reevaluate and comment on prognosis - appreciate assistance, per Dr. Thomasena Edis, will try to have stroke neurologist attend family discussion.  Plan for time limited trial over weekend and repeat meeting on Monday.  Discussed on 2/25 though limited as this is my first time meeting her, I suspect her prognosis for recovery is poor - some improvement in responsiveness since then, will continue to follow and discuss with fam/neuro/palliative.   See palliative care note DNR/DNI  Fever CXR with bibasilar opacities UA pending Blood cultures pending Will hold additional abx at this time Currently on fluconazole for previous cx with yeast  Bilateral Thalamic  Infarcts Acute metabolic encephalopathy -Prior history of embolic infarcts -The patient currently has an NG tube  -Neurology and the stroke team have seen her -Started on amantadine (ritalin d/c'd) - (per neuro, consider modafinil - plan for trial of modafinil per neurology)  -Due to her lethargy, she has Kingjames Coury cor track and is receiving Osmolite - further plans for PEG vs comfort care pending additional GOC discussions   - asymmetric pupils -> repeat MRI 2/25 with no new acute infarct, expected evolution of known infarcts - therapy exam limited by lethargy 2/25 (see note) - EEG 2/13 with moderate diffuse encephalopathy  Intermittent agitation - waking up from her brain injury and agitated - will liberalize visitation so family can help sit with her  CKD 3A- AKI due to dehydration  Right Sided Hydronephrosis with UPJ stone -Baseline creatinine appears to be 1.0-1.3 -started free water per tube and watching I & O closely to prevent heart failure exacerbation - BUN & Cr continued to rise despite adequate hydration - 2/19>- Cr rose to 2.15- Ordered Yonna Alwin renal ultrasound which showed right sided hydronephrosis and nephrolithiasis with Nithila Sumners UPJ stone -  consulted nephrology and urology-  -  IR placed Arda Keadle right nephrostomy tube on 2/21 - renal function now improving, continue to monitor  UTI: GBS UTI from 2/12 appears to have been treated with ceftriaxone.  2/20 yeast UTI, will start fluconazole.  Afebrile, normal WBC count.  Follow.   Grade 2 diastolic heart failure-chronic -2D echo performed during this admission reveals grade 2 diastolic dysfunction - following fluid status closely  Gluteal skin breakdown -  have ordered [place] and barrier cream - will switch to an air mattress to prevent further skin breakdown- Due to NG tube and risk for aspiration, it is difficult to turn her in the bed Q 4 hrs     Diabetes mellitus 2 -Continue on insulin sliding scale every 4 hours while she is on tube  feeds -basal insulin, bolus insulin - adjust prn   Hypothyroidism -Continue Synthroid per tube  Hyperkalemia Mild, follow  DVT prophylaxis: heparin  Code Status: DNR Family Communication: husband at bedside Disposition:   Status is: Inpatient  Remains inpatient appropriate because:Inpatient level of care appropriate due to severity of illness   Dispo: The patient is from: Home              Anticipated d/c is to: Home              Patient currently is not medically stable to d/c.   Difficult to place patient No       Consultants:   Neurology  Palliative  PCCM  Nephrology  IR  urology  Procedures:  EEG IMPRESSION: This study is suggestive of moderate diffuse encephalopathy, nonspecific etiology. No seizures or epileptiform discharges were seen throughout the recording.  2/21  EXAM: 1. ULTRASOUND GUIDANCE FOR PUNCTURE OF THE RIGHT RENAL COLLECTING SYSTEM 2. RIGHT PERCUTANEOUS NEPHROSTOMY TUBE PLACEMENT.  Antimicrobials: Anti-infectives (From admission, onward)   Start     Dose/Rate Route Frequency Ordered Stop   04/03/20 2000  fluconazole (DIFLUCAN) 40 MG/ML suspension 100 mg       "Followed by" Linked Group Details   100 mg Per Tube Every 24 hours 04/02/20 1752     04/02/20 1900  fluconazole (DIFLUCAN) 40 MG/ML suspension 200 mg       "Followed by" Linked Group Details   200 mg Per Tube  Once 04/02/20 1752 04/02/20 2020   03/26/20 0730  cefTRIAXone (ROCEPHIN) 1 g in sodium chloride 0.9 % 100 mL IVPB        1 g 200 mL/hr over 30 Minutes Intravenous Every 24 hours 03/26/20 0644 03/30/20 0844   03/18/20 1900  azithromycin (ZITHROMAX) 500 mg in sodium chloride 0.9 % 250 mL IVPB  Status:  Discontinued        500 mg 250 mL/hr over 60 Minutes Intravenous Every 24 hours 03/18/20 1830 03/19/20 1106   03/18/20 1900  cefTRIAXone (ROCEPHIN) 2 g in sodium chloride 0.9 % 100 mL IVPB  Status:  Discontinued        2 g 200 mL/hr over 30 Minutes Intravenous Every  24 hours 03/18/20 1830 03/19/20 1106     Subjective: More responsive today Appears to be trying to speak more actively   Objective: Vitals:   04/03/20 0404 04/03/20 0723 04/03/20 1024 04/03/20 1135  BP: 123/72 140/84 138/70 (!) 141/78  Pulse: 87 90  92  Resp: 19 20  20   Temp: 98.2 F (36.8 C) 99.1 F (37.3 C)  99.2 F (37.3 C)  TempSrc: Oral Oral  Oral  SpO2: 100% 100%  98%  Weight:      Height:        Intake/Output Summary (Last 24 hours) at 04/03/2020 1413 Last data filed at 04/03/2020 1000 Gross per 24 hour  Intake 2980 ml  Output 875 ml  Net 2105 ml   Filed Weights   03/28/20 0500 03/29/20 0342 03/30/20 0309  Weight: 108.2 kg 107.9 kg 78.9 kg    Examination:  General: No acute distress. Cardiovascular: Heart sounds show  Betina Puckett regular rate, and rhythm.  Lungs: Clear to auscultation bilaterally  Abdomen: Soft, nontender, nondistended - NG in place Neurological: disoriented, she's more responsive to voice today, intermittently follows instructions Skin: Warm and dry. No rashes or lesions. Extremities: No clubbing or cyanosis. No edema  Data Reviewed: I have personally reviewed following labs and imaging studies  CBC: Recent Labs  Lab 03/31/20 0357 04/01/20 0257 04/02/20 0246 04/03/20 0135  WBC 7.4 8.2 6.8 6.5  NEUTROABS 5.1 6.0 4.6 4.3  HGB 11.3* 12.1 11.4* 12.2  HCT 33.8* 36.4 35.1* 37.6  MCV 98.5 98.6 98.3 99.2  PLT 283 315 293 290    Basic Metabolic Panel: Recent Labs  Lab 03/29/20 0407 03/30/20 0337 03/31/20 0357 04/01/20 0257 04/02/20 0246 04/03/20 0135  NA 140 139 137 138 138 136  K 4.8 4.7 4.7 5.2* 4.7 5.1  CL 102 102 96* 100 97* 98  CO2 28 26 27 26 30 27   GLUCOSE 197* 269* 290* 206* 224* 267*  BUN 36* 33* 30* 34* 37* 45*  CREATININE 1.24* 1.15* 1.05* 1.04* 1.07* 1.13*  CALCIUM 9.1 9.6 9.7 9.6 9.5 9.6  MG  --   --   --  1.7 1.8 2.0  PHOS 4.0 3.9  --  4.4 4.2 4.9*    GFR: Estimated Creatinine Clearance: 36.2 mL/min (Maksim Peregoy) (by C-G  formula based on SCr of 1.13 mg/dL (H)).  Liver Function Tests: Recent Labs  Lab 03/29/20 0407 03/30/20 0337 04/01/20 0257 04/02/20 0246 04/03/20 0135  AST  --   --  20 17 19   ALT  --   --  13 13 13   ALKPHOS  --   --  75 80 86  BILITOT  --   --  0.6 0.4 0.6  PROT  --   --  6.5 6.4* 6.5  ALBUMIN 2.5* 2.8* 2.9* 2.6* 2.7*    CBG: Recent Labs  Lab 04/02/20 2006 04/02/20 2345 04/03/20 0405 04/03/20 0747 04/03/20 1131  GLUCAP 145* 215* 244* 224* 231*     Recent Results (from the past 240 hour(s))  Culture, Urine     Status: Abnormal   Collection Time: 03/26/20  6:44 AM   Specimen: Urine, Random  Result Value Ref Range Status   Specimen Description URINE, RANDOM  Final   Special Requests   Final    NONE Performed at [facility], [address], [place], [facility] [address]    Culture >=100,000 COLONIES/mL YEAST (Garrick Midgley)  Final   Report Status 03/27/2020 FINAL  Final         Radiology Studies: DG CHEST PORT 1 VIEW  Result Date: 04/03/2020 CLINICAL DATA:  Fever EXAM: PORTABLE CHEST 1 VIEW COMPARISON:  03/18/2020 FINDINGS: Feeding tube has been placed with the tip in the stomach. Bibasilar opacities, favor atelectasis. Heart is borderline in size. No effusions or pneumothorax. Bilateral shoulder replacements. No acute bony abnormality. IMPRESSION: Bibasilar opacities, favor atelectasis. Electronically Signed   By: 03/29/2020 M.D.   On: 04/03/2020 08:26        Scheduled Meds: . amantadine  50 mg Per Tube Daily  . amLODipine  5 mg Per Tube Daily  . chlorhexidine  15 mL Mouth Rinse BID  . Chlorhexidine Gluconate Cloth  6 each Topical Daily  . clopidogrel  75 mg Per Tube Q breakfast  . feeding supplement (PROSource TF)  45 mL Per Tube TID  . fluconazole  100 mg Per Tube Q24H  . free water  200 mL Per Tube  Q6H  . heparin  5,000 Units Subcutaneous Q8H  . insulin aspart  0-9 Units Subcutaneous Q4H  . insulin detemir  5 Units Subcutaneous Daily  . levothyroxine   112 mcg Per Tube QAC breakfast  . liver oil-zinc oxide   Topical BID  . mouth rinse  15 mL Mouth Rinse q12n4p  . modafinil  100 mg Oral Daily  . rosuvastatin  10 mg Per Tube Daily  . sodium chloride flush  3 mL Intravenous Once  . sodium chloride flush  5 mL Intracatheter Q8H   Continuous Infusions: . sodium chloride 10 mL/hr at 03/24/20 0305  . feeding supplement (OSMOLITE 1.2 CAL) 45 mL/hr at 04/03/20 0529     LOS: 16 days    Time spent: over 30 min    Lacretia Nicksaldwell Powell, MD [facility]   To contact the attending provider between 7A-7P or the covering provider during after hours 7P-7A, please log into the web site [email or web address] and access using universal [place] password for that web site. If you do not have the password, please call the hospital operator.  04/03/2020, 2:13 PM

## 2020-04-03 NOTE — Progress Notes (Signed)
Nutrition Follow-up  DOCUMENTATION CODES:   Not applicable  INTERVENTION:   Continue TF via Cortrak tube:  Osmolite 1.2 at 45 ml/h  Prosource TF 45 ml TID  Free water flushes 200 ml every 6 hours  Provides 1416 kcal, 93 gm protein, 1685 ml free water total daily  NUTRITION DIAGNOSIS:   Inadequate oral intake related to inability to eat as evidenced by NPO status.  Ongoing   GOAL:   Patient will meet greater than or equal to 90% of their needs  Met with TF  MONITOR:   Diet advancement,TF tolerance  REASON FOR ASSESSMENT:   Consult Enteral/tube feeding initiation and management  ASSESSMENT:   Pt with PMH of HTN, HLD, Dm, GERD, COVID 02/2018, PE, IBS, cerebral embolic strokes 16/83 with residula L hand weakness now admitted with bilateral thalamic infarct extending back to the midbrain c/w Percheron stroke.  2/14 Cortrak placed (gastric) 2/19 ultrasound revealed hydronephrosis, R worse that L and chronic R UPJ obstruction. 2/21 R percutaneous nephrostomy drain placement in IR.  Patient remains NPO. Cortrak in place (gastric tip). Receiving Osmolite 1.2 at 45 ml/h with Prosource TF 45 ml TID providing 1416 kcal, 93 gm protein, 885 ml free water daily. Free water flushes 200 ml every 6 hours. Tolerating well.  PMT continues to follow pt. Pt noted to have been more interactive today. Family not ready to make decision re comfort care and currently want to continue with current measures. Family is hopeful pt will be able to participate in a swallow study soon. If she is unable to do so, MD plans to discuss possibility of G-tube.  Code status: DNR  UOP: 440m documented x24 hours  Admit wt: 78.9 kg Current wt: 78.9 kg  Medications: ss novolog Q4H, 2 units novolog Q4H, 5 units levemir daily Labs: PO4 4.9 (H) CBGs [id number] (Diabetes Coordinator following)  Diet Order:   Diet Order            Diet NPO time specified  Diet effective midnight                  EDUCATION NEEDS:   No education needs have been identified at this time  Skin:  Skin Assessment: Reviewed RN Assessment  Last BM:  2/27  Height:   Ht Readings from Last 1 Encounters:  03/18/20 5' 3"  (1.6 m)    Weight:   Wt Readings from Last 1 Encounters:  03/30/20 78.9 kg    Ideal Body Weight:  52.2 kg  BMI:  Body mass index is 30.82 kg/m.  Estimated Nutritional Needs:   Kcal:  1400  Protein:  85-100 grams  Fluid:  > 1.5 L/day   ALarkin Ina MS, RD, LDN RD pager number and weekend/on-call pager number located in [place]

## 2020-04-03 NOTE — Progress Notes (Addendum)
Physical Therapy Treatment Patient Details Name: Hannah Fischer MRN: [id number] DOB: Jan 09, 1933 Today's Date: 04/03/2020    History of Present Illness 87 y.o. female presenting code stroke. Imaging (+) for bilateral medial thalamic and punctate infarcts and L superior cerebellar infarcts. PMHx significant for multiple CVAs with most recent acute/subacute R frontal lobe and L parietal and occipital lobe infarcts ~3 months ago, residual L-sided hemiplegia with recent d/c from outpatient PT/OT with return to baseline level of function, TIA, DM, PE, anxiety, HTN, and HLD.    PT Comments    Patient progressing very slowly towards PT goals. More moments of alertness noted this session but not able to sustain; opening eyes to command but mostly closed throughout session. Follows 1 step commands with increased time and cues ~75% of the time when alert. Requires Max A for rolling and bed mobility with some initiation with movement of LEs. Tolerated sitting EOB ~10 minutes working on upright posture, cervical AROM/stretching and there ex. Pt responding verbally to questions today for a short period. RN notified. Will continue to follow. POC updated. Family meeting planned today.     Follow Up Recommendations  SNF;Supervision/Assistance - 24 hour     Equipment Recommendations  Wheelchair cushion (measurements PT);Wheelchair (measurements PT);Hospital bed    Recommendations for Other Services       Precautions / Restrictions Precautions Precautions: Fall Precaution Comments: cortrak, bilateral mittens, nephrostomy drain. Restrictions Weight Bearing Restrictions: No    Mobility  Bed Mobility Overal bed mobility: Needs Assistance Bed Mobility: Rolling;Sidelying to Sit;Sit to Supine Rolling: Max assist;Total assist Sidelying to sit: Max assist;+2 for physical assistance;HOB elevated   Sit to supine: Total assist   General bed mobility comments: Step by step cues for sequencing and to reach  with LUE to rail on right for rolling; initiating movement of RLE. Assist with trunk and scooting bottom to EOB. Total A to return to supine. ROlling to left/right for pericare    Transfers                 General transfer comment: Deferred due to arousal.  Ambulation/Gait                 Stairs             Wheelchair Mobility    Modified Rankin (Stroke Patients Only) Modified Rankin (Stroke Patients Only) Pre-Morbid Rankin Score: No significant disability Modified Rankin: Severe disability     Balance Overall balance assessment: Needs assistance Sitting-balance support: Feet unsupported;No upper extremity supported Sitting balance-Leahy Scale: Poor Sitting balance - Comments: Min-mod A for sitting balance, forward head/shoulders and posterior pelvic tilt. Worked on cervical extension/AROM/rotation and there ex sitting EOB. Pt had moments of MIn guard but not able to sustain. Postural control: Right lateral lean;Posterior lean                                  Cognition Arousal/Alertness: Lethargic Behavior During Therapy: Flat affect Overall Cognitive Status: Impaired/Different from baseline Area of Impairment: Orientation;Attention                 Orientation Level: Disoriented to;Time;Situation Current Attention Level: Focused   Following Commands: Follows one step commands consistently;Follows one step commands with increased time Safety/Judgement: Decreased awareness of safety;Decreased awareness of deficits Awareness: Intellectual Problem Solving: Slow processing;Decreased initiation;Requires verbal cues;Requires tactile cues General Comments: "December" "2002" Opens eyes to command but difficult to sustain. Focused  attention. Mostly lethargic but following 1 step commands 75% of the time.      Exercises General Exercises - Lower Extremity Ankle Circles/Pumps: AROM;Both;5 reps;Supine Long Arc Quad: AROM;AAROM;Both;5  reps;Seated    General Comments General comments (skin integrity, edema, etc.): Noted tremoring of LUE during session sporatically and inconsisently during session.      Pertinent Vitals/Pain Pain Assessment: Faces Faces Pain Scale: No hurt    Home Living                      Prior Function            PT Goals (current goals can now be found in the care plan section) Acute Rehab PT Goals Patient Stated Goal: not able to state PT Goal Formulation: Patient unable to participate in goal setting Time For Goal Achievement: 04/17/20 Potential to Achieve Goals: Fair Progress towards PT goals: Progressing toward goals (slowly)    Frequency    Min 3X/week      PT Plan Current plan remains appropriate    Co-evaluation              AM-PAC PT "6 Clicks" Mobility   Outcome Measure  Help needed turning from your back to your side while in a flat bed without using bedrails?: Total Help needed moving from lying on your back to sitting on the side of a flat bed without using bedrails?: Total Help needed moving to and from a bed to a chair (including a wheelchair)?: Total Help needed standing up from a chair using your arms (e.g., wheelchair or bedside chair)?: Total Help needed to walk in hospital room?: Total Help needed climbing 3-5 steps with a railing? : Total 6 Click Score: 6    End of Session Equipment Utilized During Treatment: Oxygen Activity Tolerance: Patient limited by lethargy Patient left: in bed;with call bell/phone within reach;with SCD's reapplied;with restraints reapplied Nurse Communication: Mobility status;Need for lift equipment PT Visit Diagnosis: Other symptoms and signs involving the nervous system (R29.898);Difficulty in walking, not elsewhere classified (R26.2);Unsteadiness on feet (R26.81);Muscle weakness (generalized) (M62.81)     Time: 4496-7591 PT Time Calculation (min) (ACUTE ONLY): 25 min  Charges:  $Therapeutic Activity: 8-22  mins $Neuromuscular Re-education: 8-22 mins                     Vale Haven, PT, DPT Acute Rehabilitation Services Pager [phone] Office [phone]       Marcy Panning 04/03/2020, 12:33 PM

## 2020-04-03 NOTE — Progress Notes (Signed)
Inpatient Diabetes Program Recommendations  AACE/ADA: New Consensus Statement on Inpatient Glycemic Control (2015)  Target Ranges:  Prepandial:   less than 140 mg/dL      Peak postprandial:   less than 180 mg/dL (1-2 hours)      Critically ill patients:  140 - 180 mg/dL   Lab Results  Component Value Date   GLUCAP 224 (H) 04/03/2020   HGBA1C 7.6 (H) 03/18/2020    Review of Glycemic Control Results for Hannah Fischer, Hannah Fischer (MRN [id number]) as of 04/03/2020 11:30  Ref. Range 04/02/2020 15:19 04/02/2020 20:06 04/02/2020 23:45 04/03/2020 04:05 04/03/2020 07:47  Glucose-Capillary Latest Ref Range: 70 - 99 mg/dL 149 (H) 702 (H) 637 (H) 244 (H) 224 (H)   Current orders for Inpatient glycemic control: Novolog 0-9 units Q4H, Levemir 5 units daily  Inpatient Diabetes Program Recommendations:    Might consider, Novolog 2-3 units Q4H tube feed coverage.  Stop if feeds are held or discontinued.    Will continue to follow while inpatient.  Thank you, Dulce Sellar, RN, BSN Diabetes Coordinator Inpatient Diabetes Program [phone] (team pager from 8a-5p)

## 2020-04-04 ENCOUNTER — Inpatient Hospital Stay (HOSPITAL_COMMUNITY): Payer: Medicare Other

## 2020-04-04 DIAGNOSIS — N179 Acute kidney failure, unspecified: Secondary | ICD-10-CM | POA: Diagnosis not present

## 2020-04-04 DIAGNOSIS — G9340 Encephalopathy, unspecified: Secondary | ICD-10-CM | POA: Diagnosis not present

## 2020-04-04 LAB — MAGNESIUM: Magnesium: 2.1 mg/dL (ref 1.7–2.4)

## 2020-04-04 LAB — GLUCOSE, CAPILLARY
Glucose-Capillary: 121 mg/dL — ABNORMAL HIGH (ref 70–99)
Glucose-Capillary: 146 mg/dL — ABNORMAL HIGH (ref 70–99)
Glucose-Capillary: 165 mg/dL — ABNORMAL HIGH (ref 70–99)
Glucose-Capillary: 167 mg/dL — ABNORMAL HIGH (ref 70–99)
Glucose-Capillary: 170 mg/dL — ABNORMAL HIGH (ref 70–99)
Glucose-Capillary: 195 mg/dL — ABNORMAL HIGH (ref 70–99)
Glucose-Capillary: 87 mg/dL (ref 70–99)

## 2020-04-04 LAB — BLOOD CULTURE ID PANEL (REFLEXED) - BCID2

## 2020-04-04 LAB — CBC WITH DIFFERENTIAL/PLATELET
Abs Immature Granulocytes: 0.05 10*3/uL (ref 0.00–0.07)
Basophils Absolute: 0.1 10*3/uL (ref 0.0–0.1)
Basophils Relative: 1 %
Eosinophils Absolute: 0.3 10*3/uL (ref 0.0–0.5)
Eosinophils Relative: 5 %
HCT: 36.8 % (ref 36.0–46.0)
Hemoglobin: 11.7 g/dL — ABNORMAL LOW (ref 12.0–15.0)
Immature Granulocytes: 1 %
Lymphocytes Relative: 18 %
Lymphs Abs: 1.1 10*3/uL (ref 0.7–4.0)
MCH: 31.6 pg (ref 26.0–34.0)
MCHC: 31.8 g/dL (ref 30.0–36.0)
MCV: 99.5 fL (ref 80.0–100.0)
Monocytes Absolute: 0.7 10*3/uL (ref 0.1–1.0)
Monocytes Relative: 11 %
Neutro Abs: 4.2 10*3/uL (ref 1.7–7.7)
Neutrophils Relative %: 64 %
Platelets: 322 10*3/uL (ref 150–400)
RBC: 3.7 MIL/uL — ABNORMAL LOW (ref 3.87–5.11)
RDW: 12.9 % (ref 11.5–15.5)
WBC: 6.4 10*3/uL (ref 4.0–10.5)
nRBC: 0 % (ref 0.0–0.2)

## 2020-04-04 LAB — URINALYSIS, ROUTINE W REFLEX MICROSCOPIC

## 2020-04-04 LAB — COMPREHENSIVE METABOLIC PANEL
ALT: 14 U/L (ref 0–44)
AST: 19 U/L (ref 15–41)
Albumin: 2.6 g/dL — ABNORMAL LOW (ref 3.5–5.0)
Alkaline Phosphatase: 87 U/L (ref 38–126)
Anion gap: 11 (ref 5–15)
BUN: 45 mg/dL — ABNORMAL HIGH (ref 8–23)
CO2: 28 mmol/L (ref 22–32)
Calcium: 9.7 mg/dL (ref 8.9–10.3)
Chloride: 98 mmol/L (ref 98–111)
Creatinine, Ser: 1.15 mg/dL — ABNORMAL HIGH (ref 0.44–1.00)
GFR, Estimated: 47 mL/min — ABNORMAL LOW (ref >60)
Glucose, Bld: 199 mg/dL — ABNORMAL HIGH (ref 70–99)
Potassium: 4.9 mmol/L (ref 3.5–5.1)
Sodium: 137 mmol/L (ref 135–145)
Total Bilirubin: 0.6 mg/dL (ref 0.3–1.2)
Total Protein: 6.5 g/dL (ref 6.5–8.1)

## 2020-04-04 LAB — URINALYSIS, MICROSCOPIC (REFLEX)
RBC / HPF: >50 RBC/hpf (ref 0–5)
Squamous Epithelial / HPF: NONE SEEN (ref 0–5)
WBC, UA: >50 WBC/hpf (ref 0–5)

## 2020-04-04 LAB — PHOSPHORUS: Phosphorus: 4.5 mg/dL (ref 2.5–4.6)

## 2020-04-04 MED ORDER — DEXTROSE 5 % IV SOLN: INTRAVENOUS | Status: DC

## 2020-04-04 MED ORDER — DEXTROSE IN LACTATED RINGERS 5 % IV SOLN: INTRAVENOUS | Status: AC

## 2020-04-04 MED ORDER — MODAFINIL 100 MG PO TABS: 300.0000 mg | ORAL_TABLET | Freq: Every day | ORAL | Status: DC

## 2020-04-04 NOTE — Progress Notes (Signed)
Occupational Therapy Treatment Patient Details Name: Hannah Fischer MRN: [id number] DOB: 02/26/1932 Today's Date: 04/04/2020    History of present illness 87 y.o. female presenting code stroke. Imaging (+) for bilateral medial thalamic and punctate infarcts and L superior cerebellar infarcts. PMHx significant for multiple CVAs with most recent acute/subacute R frontal lobe and L parietal and occipital lobe infarcts ~3 months ago, residual L-sided hemiplegia with recent d/c from outpatient PT/OT with return to baseline level of function, TIA, DM, PE, anxiety, HTN, and HLD.   OT comments  Patient met lying supine in bed. Husband present at bedside. Patient maintains eyes closed throughout session despite cues to open them Patient able to state first and last name but disoriented to place and situation. Patient also unable to recall husbands name this date. Patient positioned with head to R with noted facial grimacing with repositioning. Extensive time spent on PROM to cervical neck and BUE (noted facial grimacing with movement in LUE>RUE) to facilitate soft tissue elongation and reduce risk of contractures. Patient does not attempt to assist. Therapist prompted patient to count to 10 in coordination with repetitions for exercises. Patient attempts to count but words are difficult to distinguish as patient mumbles. Patient positioned with pillows under arms bilaterally, towel roll at R aspect of head to maintain midline, and heels floated. OT will continue to follow acutely.    Follow Up Recommendations  SNF    Equipment Recommendations  Hospital bed    Recommendations for Other Services      Precautions / Restrictions Precautions Precautions: Fall Precaution Comments: cortrak, bilateral mittens, nephrostomy drain. Restrictions Weight Bearing Restrictions: No       Mobility Bed Mobility Overal bed mobility: Needs Assistance Bed Mobility: Rolling Rolling: Total assist         General  bed mobility comments: Total A 2/2 decreased level of arousal. Patient does not attempt to assist.    Transfers                 General transfer comment: Deferred 2/2 decreased level of arousal.    Balance                                           ADL either performed or assessed with clinical judgement   ADL Overall ADL's : Needs assistance/impaired Eating/Feeding: NPO (Cortrak)   Grooming: Total assistance;Bed level Grooming Details (indicate cue type and reason): Unable 2/2 level of arousal. Does not grip washcloth when prompted.             Lower Body Dressing: Total assistance;Bed level Lower Body Dressing Details (indicate cue type and reason): Total A to adjust bilateral socks in supine. Patient does not attempt to assist. Does not lift legs off bed surface despite multimodal cues.               General ADL Comments: Patient limited by decreased level of arousal requirng Total A grossly for bed mobiltiy and all ADLs this date. Patient does not attempt to assist.     Vision       Perception     Praxis      Cognition Arousal/Alertness: Lethargic Behavior During Therapy: Flat affect Overall Cognitive Status: Impaired/Different from baseline Area of Impairment: Orientation;Attention                 Orientation Level: Disoriented to;Time;Situation Current Attention Level:  Focused   Following Commands: Follows one step commands consistently;Follows one step commands with increased time Safety/Judgement: Decreased awareness of safety;Decreased awareness of deficits Awareness: Intellectual Problem Solving: Slow processing;Decreased initiation;Requires verbal cues;Requires tactile cues General Comments: Patient states first and last name but doesn't respond to prompts to state date. Patient states "at home" when asked where she is. Patient mumbles and is very soft spoken. Patient tells this therapist; "you don't have to talk so  loud".        Exercises Exercises: General Upper Extremity General Exercises - Upper Extremity Shoulder Flexion: PROM;10 reps;Supine;Right;Left Shoulder ABduction: PROM;Right;Left;10 reps;Supine Shoulder ADduction: PROM;10 reps;Right;Left;Supine Elbow Flexion: PROM;Left;Right;10 reps;Supine Elbow Extension: PROM;Right;Left;10 reps;Supine Wrist Flexion: PROM;10 reps;Right;Left;Supine Wrist Extension: PROM;Right;Left;10 reps;Supine   Shoulder Instructions       General Comments      Pertinent Vitals/ Pain       Pain Assessment: Faces Faces Pain Scale: Hurts a little bit Pain Location: generalized discomfort with cervical and BUE PROM Pain Descriptors / Indicators: Grimacing Pain Intervention(s): Limited activity within patient's tolerance;Monitored during session;Repositioned  Home Living                                          Prior Functioning/Environment              Frequency  Min 1X/week        Progress Toward Goals  OT Goals(current goals can now be found in the care plan section)  Progress towards OT goals: Not progressing toward goals - comment  Acute Rehab OT Goals Patient Stated Goal: Unable to state. OT Goal Formulation: With family Time For Goal Achievement: 04/18/20 Potential to Achieve Goals: Fair ADL Goals Additional ADL Goal #1: Patient will keep eyes open for 50% of OT treatment session demonstrating increased alertness and level of arousal. Additional ADL Goal #2: Patient will maintain static sitting balance at EOB with no more than Min A in prep for seated ADLs. Additional ADL Goal #3: Patient will attend to 1 grooming task >1-3 min with Mod cues and hand over hand assist.  Plan Discharge plan remains appropriate;Frequency needs to be updated    Co-evaluation                 AM-PAC OT "6 Clicks" Daily Activity     Outcome Measure   Help from another person eating meals?: Total Help from another person taking  care of personal grooming?: Total Help from another person toileting, which includes using toliet, bedpan, or urinal?: Total Help from another person bathing (including washing, rinsing, drying)?: Total Help from another person to put on and taking off regular upper body clothing?: Total Help from another person to put on and taking off regular lower body clothing?: Total 6 Click Score: 6    End of Session    OT Visit Diagnosis: Unsteadiness on feet (R26.81);Other abnormalities of gait and mobility (R26.89);Muscle weakness (generalized) (M62.81);Hemiplegia and hemiparesis Hemiplegia - caused by: Cerebral infarction   Activity Tolerance Patient limited by lethargy   Patient Left in bed;with call bell/phone within reach;with bed alarm set;with restraints reapplied;with family/visitor present   Nurse Communication          Time: 7026-3785 OT Time Calculation (min): 36 min  Charges: OT General Charges $OT Visit: 1 Visit OT Treatments $Therapeutic Activity: 23-37 mins   H. OTR/L Supplemental OT, Department of rehab services [phone]  [place]. 04/04/2020, 12:50 PM

## 2020-04-04 NOTE — Progress Notes (Signed)
Ok to add a stop date of 7d to fluconazole per Dr Lowell Guitar.   Ulyses Southward, PharmD, BCIDP, AAHIVP, CPP Infectious Disease Pharmacist 04/04/2020 9:16 AM

## 2020-04-04 NOTE — Progress Notes (Addendum)
PROGRESS NOTE    DAFFNEY GREENLY  JYN:[id number] DOB: 1932/05/24 DOA: 03/18/2020 PCP: Sigmund Hazel, MD   Chief Complaint  Patient presents with  . 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  She had gone to bedin the morning her husband had trouble waking her up.  In the ED she was noted to be responsive to pain.  A code stroke was called.  She was outside the window for TPA.  An emergent CT did not show any acute abnormality.  A CTA was performed and did not reveal any obvious stenosis.  An MRI of the brain was performed on 2/13:Punctate acute infarction in the left superior cerebellumadjacent to the superior cerebellar peduncle. Bilateral thalamic acute infarction extending back into the mid brain. This is consistent with artery of Percheron infarction.  She continues to wax and wane from a mental status standpoint, 3/1 being minimally interactive/responsive.  She has cortrak.  Palliative and neuro are following, may need peg if no improvement towards end of week.  Assessment & Plan:   Principal Problem:   Acute encephalopathy Active Problems:   Diabetes mellitus (HCC)   Hypothyroidism   History of embolic stroke  Goals of Care: GOC discussion with family 2/25.  Appreciate palliative.  At this time planning to continue to follow to see if she improves.  May need peg if no improvement.  See palliative care note from 2/28 DNR/DNI  Bilateral Thalamic Infarcts Acute metabolic encephalopathy - mental status waxing and waning, she's minimally responsive today (was responding more and talking some yesterday) - provigil increased per neurology - Prior history of embolic infarcts -The patient currently has an NG tube  -Neurology and the stroke team have seen her -Started on  amantadine (ritalin d/c'd) - (per neuro, consider modafinil - plan for trial of modafinil per neurology)  - Due to her lethargy, she has a cor track and is receiving Osmolite - further plans for PEG vs comfort care pending additional GOC discussions   - asymmetric pupils -> repeat MRI 2/25 with no new acute infarct, expected evolution of known infarcts - therapy exam limited by lethargy 2/25 (see note) - EEG 2/13 with moderate diffuse encephalopathy  S/p NG tube: has cortrak which was dislodged this AM -> KUB with catheter tip at GE junction - will hold tube feeds for now and try to advance tube (cortrak team here 3/2).  Dextrose containing fluids until cortrak advanced.  Fever Fever on 2/27 CXR with bibasilar opacities UA pending -> follow culture Blood cultures 1/2 with staph epidermidis (likely contaminated) Will hold additional abx at this time Currently on fluconazole for previous urine cx with yeast  Intermittent agitation - waking up from her brain injury and agitated - will liberalize visitation so family can help sit with her  CKD 3A- AKI due to dehydration  Right Sided Hydronephrosis  -Baseline creatinine appears to be 1.0-1.3 -started free water per tube and watching I & O closely to prevent heart failure exacerbation - BUN & Cr continued to rise despite adequate hydration - 2/19>- Cr rose to 2.15- Ordered a renal ultrasound which showed right sided hydronephrosis (no mass or definite calcification identified) -  consulted nephrology and urology-  -  IR placed a right nephrostomy tube on 2/21 - renal function now improving, continue to monitor  UTI: GBS UTI from 2/12 appears to have been treated with ceftriaxone.  2/20 yeast UTI, will start fluconazole.  Afebrile, normal WBC count.  Follow.  Follow repeat urine cx as noted above  Grade 2 diastolic heart failure-chronic -2D echo performed during this admission reveals grade 2 diastolic dysfunction - following fluid  status closely  Gluteal skin breakdown - have ordered Destin and barrier cream - will switch to an air mattress to prevent further skin breakdown - turn as able      Diabetes mellitus 2 -Continue on insulin sliding scale every 4 hours while she is on tube feeds -basal insulin, bolus insulin - adjust prn   Hypothyroidism -Continue Synthroid per tube  Hyperkalemia Mild, follow  DVT prophylaxis: heparin  Code Status: DNR Family Communication: husband at bedside Disposition:   Status is: Inpatient  Remains inpatient appropriate because:Inpatient level of care appropriate due to severity of illness   Dispo: The patient is from: Home              Anticipated d/c is to: Home              Patient currently is not medically stable to d/c.   Difficult to place patient No       Consultants:   Neurology  Palliative  PCCM  Nephrology  IR  urology  Procedures:  EEG IMPRESSION: This study is suggestive of moderate diffuse encephalopathy, nonspecific etiology. No seizures or epileptiform discharges were seen throughout the recording.  2/21  EXAM: 1. ULTRASOUND GUIDANCE FOR PUNCTURE OF THE RIGHT RENAL COLLECTING SYSTEM 2. RIGHT PERCUTANEOUS NEPHROSTOMY TUBE PLACEMENT.  Antimicrobials: Anti-infectives (From admission, onward)   Start     Dose/Rate Route Frequency Ordered Stop   04/03/20 2000  fluconazole (DIFLUCAN) 40 MG/ML suspension 100 mg       "Followed by" Linked Group Details   100 mg Per Tube Every 24 hours 04/02/20 1752 04/09/20 1959   04/02/20 1900  fluconazole (DIFLUCAN) 40 MG/ML suspension 200 mg       "Followed by" Linked Group Details   200 mg Per Tube  Once 04/02/20 1752 04/02/20 2020   03/26/20 0730  cefTRIAXone (ROCEPHIN) 1 g in sodium chloride 0.9 % 100 mL IVPB        1 g 200 mL/hr over 30 Minutes Intravenous Every 24 hours 03/26/20 0644 03/30/20 0844   03/18/20 1900  azithromycin (ZITHROMAX) 500 mg in sodium chloride 0.9 % 250 mL IVPB   Status:  Discontinued        500 mg 250 mL/hr over 60 Minutes Intravenous Every 24 hours 03/18/20 1830 03/19/20 1106   03/18/20 1900  cefTRIAXone (ROCEPHIN) 2 g in sodium chloride 0.9 % 100 mL IVPB  Status:  Discontinued        2 g 200 mL/hr over 30 Minutes Intravenous Every 24 hours 03/18/20 1830 03/19/20 1106     Subjective: Unresponsive today  Objective: Vitals:   04/04/20 0808 04/04/20 1107 04/04/20 1147 04/04/20 1531  BP: 139/77 125/66 127/70 138/61  Pulse: 88 83 84 77  Resp: (!) 24 (!) 26 (!) 24 (!) 24  Temp: 98.6 F (37 C) (!) 97.5 F (36.4 C) 97.9 F (36.6 C) 99 F (37.2 C)  TempSrc: Oral Axillary Oral Oral  SpO2: 100% 100% 100% 100%  Weight:      Height:        Intake/Output Summary (Last 24 hours) at 04/04/2020 1936 Last data filed at 04/04/2020 1600 Gross per 24 hour  Intake  5 ml  Output 1850 ml  Net -1845 ml   Filed Weights   03/29/20 0342 03/30/20 0309 04/04/20 0438  Weight: 107.9 kg 78.9 kg 81.8 kg    Examination:  General: No acute distress. Cardiovascular: Heart sounds show a regular rate, and rhythm Lungs: Clear to auscultation bilaterally  Abdomen: Soft, nontender, nondistended - NG - nephrostomy tube Neurological: unresponsive today, pupils L>R, does not follow commands - moves all extremities to painful/irritative stimuli Skin: Warm and dry. No rashes or lesions. Extremities: No clubbing or cyanosis. No edema.    Data Reviewed: I have personally reviewed following labs and imaging studies  CBC: Recent Labs  Lab 03/31/20 0357 04/01/20 0257 04/02/20 0246 04/03/20 0135 04/04/20 0351  WBC 7.4 8.2 6.8 6.5 6.4  NEUTROABS 5.1 6.0 4.6 4.3 4.2  HGB 11.3* 12.1 11.4* 12.2 11.7*  HCT 33.8* 36.4 35.1* 37.6 36.8  MCV 98.5 98.6 98.3 99.2 99.5  PLT 283 315 293 290 322    Basic Metabolic Panel: Recent Labs  Lab 03/30/20 0337 03/31/20 0357 04/01/20 0257 04/02/20 0246 04/03/20 0135 04/04/20 0351  NA 139 137 138 138 136 137  K 4.7 4.7 5.2*  4.7 5.1 4.9  CL 102 96* 100 97* 98 98  CO2 GLUCOSE 269* 290* 206* 224* 267* 199*  BUN 33* 30* 34* 37* 45* 45*  CREATININE 1.15* 1.05* 1.04* 1.07* 1.13* 1.15*  CALCIUM 9.6 9.7 9.6 9.5 9.6 9.7  MG  --   --  1.7 1.8 2.0 2.1  PHOS 3.9  --  4.4 4.2 4.9* 4.5    GFR: Estimated Creatinine Clearance: 36.2 mL/min (A) (by C-G formula based on SCr of 1.15 mg/dL (H)).  Liver Function Tests: Recent Labs  Lab 03/30/20 0337 04/01/20 0257 04/02/20 0246 04/03/20 0135 04/04/20 0351  AST  --  ALT  --  ALKPHOS  --  75 80 86 87  BILITOT  --  0.6 0.4 0.6 0.6  PROT  --  6.5 6.4* 6.5 6.5  ALBUMIN 2.8* 2.9* 2.6* 2.7* 2.6*    CBG: Recent Labs  Lab 04/04/20 0022 04/04/20 0429 04/04/20 0810 04/04/20 1151 04/04/20 1529  GLUCAP 146* 195* 165* 170* 121*     Recent Results (from the past 240 hour(s))  Culture, Urine     Status: Abnormal   Collection Time: 03/26/20  6:44 AM   Specimen: Urine, Random  Result Value Ref Range Status   Specimen Description URINE, RANDOM  Final   Special Requests   Final    NONE Performed at [facility], [address], [place], [place] [address]    Culture >=100,000 COLONIES/mL YEAST (A)  Final   Report Status 03/27/2020 FINAL  Final  Culture, blood (routine x 2)     Status: None (Preliminary result)   Collection Time: 04/03/20  7:55 AM   Specimen: BLOOD RIGHT HAND  Result Value Ref Range Status   Specimen Description BLOOD RIGHT HAND  Final   Special Requests   Final    BOTTLES DRAWN AEROBIC ONLY Blood Culture results may not be optimal due to an inadequate volume of blood received in culture bottles   Culture   Final    NO GROWTH 1 DAY Performed at [facility], [address], [place], [place] [address]    Report Status PENDING  Incomplete  Culture, blood (routine x 2)     Status: None (  Preliminary result)   Collection Time: 04/03/20  8:11 AM   Specimen: BLOOD LEFT ARM  Result Value Ref Range  Status   Specimen Description BLOOD LEFT ARM  Final   Special Requests   Final    BOTTLES DRAWN AEROBIC AND ANAEROBIC Blood Culture adequate volume   Culture  Setup Time   Final    GRAM POSITIVE COCCI Organism ID to follow CRITICAL RESULT CALLED TO, READ BACK BY AND VERIFIED WITH: J LEDFORD PHARMD 04/04/20 0554 JDW IN BOTH AEROBIC AND ANAEROBIC BOTTLES Performed at [facility], [address], [place], [place] [address]    Culture GRAM POSITIVE COCCI  Final   Report Status PENDING  Incomplete  Blood Culture ID Panel (Reflexed)     Status: Abnormal   Collection Time: 04/03/20  8:11 AM  Result Value Ref Range Status   Enterococcus faecalis NOT DETECTED NOT DETECTED Final   Enterococcus Faecium NOT DETECTED NOT DETECTED Final   Listeria monocytogenes NOT DETECTED NOT DETECTED Final   Staphylococcus species DETECTED (A) NOT DETECTED Final    Comment: CRITICAL RESULT CALLED TO, READ BACK BY AND VERIFIED WITH: J LEDFORD PHARMD 04/04/20 0554 JDW    Staphylococcus aureus (BCID) NOT DETECTED NOT DETECTED Final   Staphylococcus epidermidis DETECTED (A) NOT DETECTED Final    Comment: Methicillin (oxacillin) resistant coagulase negative staphylococcus. Possible blood culture contaminant (unless isolated from more than one blood culture draw or clinical case suggests pathogenicity). No antibiotic treatment is indicated for blood  culture contaminants. CRITICAL RESULT CALLED TO, READ BACK BY AND VERIFIED WITH: J LEDFORD [facility] 04/04/20 0554 JDW    Staphylococcus lugdunensis NOT DETECTED NOT DETECTED Final   Streptococcus species NOT DETECTED NOT DETECTED Final   Streptococcus agalactiae NOT DETECTED NOT DETECTED Final   Streptococcus pneumoniae NOT DETECTED NOT DETECTED Final   Streptococcus pyogenes NOT DETECTED NOT DETECTED Final   A.calcoaceticus-baumannii NOT DETECTED NOT DETECTED Final   Bacteroides fragilis NOT DETECTED NOT DETECTED Final   Enterobacterales NOT DETECTED NOT DETECTED  Final   Enterobacter cloacae complex NOT DETECTED NOT DETECTED Final   Escherichia coli NOT DETECTED NOT DETECTED Final   Klebsiella aerogenes NOT DETECTED NOT DETECTED Final   Klebsiella oxytoca NOT DETECTED NOT DETECTED Final   Klebsiella pneumoniae NOT DETECTED NOT DETECTED Final   Proteus species NOT DETECTED NOT DETECTED Final   Salmonella species NOT DETECTED NOT DETECTED Final   Serratia marcescens NOT DETECTED NOT DETECTED Final   Haemophilus influenzae NOT DETECTED NOT DETECTED Final   Neisseria meningitidis NOT DETECTED NOT DETECTED Final   Pseudomonas aeruginosa NOT DETECTED NOT DETECTED Final   Stenotrophomonas maltophilia NOT DETECTED NOT DETECTED Final   Candida albicans NOT DETECTED NOT DETECTED Final   Candida auris NOT DETECTED NOT DETECTED Final   Candida glabrata NOT DETECTED NOT DETECTED Final   Candida krusei NOT DETECTED NOT DETECTED Final   Candida parapsilosis NOT DETECTED NOT DETECTED Final   Candida tropicalis NOT DETECTED NOT DETECTED Final   Cryptococcus neoformans/gattii NOT DETECTED NOT DETECTED Final   Methicillin resistance mecA/C DETECTED (A) NOT DETECTED Final    Comment: CRITICAL RESULT CALLED TO, READ BACK BY AND VERIFIED WITHShela Commons [facility] 04/04/20 0554 JDW Performed at [facility], [address], [place], [place] [address]          Radiology Studies: DG Abd 1 View  Result Date: 04/04/2020 CLINICAL DATA:  Enteric catheter placement EXAM: ABDOMEN - 1 VIEW COMPARISON:  03/30/2020, 04/03/2020 FINDINGS: Frontal  view of the lower chest and upper abdomen demonstrates stable position of the enteric catheter, tip overlying the gastroesophageal junction. Right-sided nephrostomy tube not appreciably changed. Dense calcification mitral annulus again noted. Bowel gas pattern is unremarkable. IMPRESSION: 1. Enteric catheter tip projecting over the gastroesophageal junction. 2. Stable right nephrostomy tube. Electronically Signed   By: Sharlet SalinaMichael  Brown  M.D.   On: 04/04/2020 15:23   DG CHEST PORT 1 VIEW  Result Date: 04/03/2020 CLINICAL DATA:  Fever EXAM: PORTABLE CHEST 1 VIEW COMPARISON:  03/18/2020 FINDINGS: Feeding tube has been placed with the tip in the stomach. Bibasilar opacities, favor atelectasis. Heart is borderline in size. No effusions or pneumothorax. Bilateral shoulder replacements. No acute bony abnormality. IMPRESSION: Bibasilar opacities, favor atelectasis. Electronically Signed   By: Charlett NoseKevin  Dover M.D.   On: 04/03/2020 08:26        Scheduled Meds: . amantadine  50 mg Per Tube Daily  . amLODipine  5 mg Per Tube Daily  . chlorhexidine  15 mL Mouth Rinse BID  . Chlorhexidine Gluconate Cloth  6 each Topical Daily  . clopidogrel  75 mg Per Tube Q breakfast  . feeding supplement (PROSource TF)  45 mL Per Tube TID  . fluconazole  100 mg Per Tube Q24H  . free water  200 mL Per Tube Q6H  . heparin  5,000 Units Subcutaneous Q8H  . insulin aspart  0-9 Units Subcutaneous Q4H  . insulin aspart  2 Units Subcutaneous Q4H  . insulin detemir  5 Units Subcutaneous Daily  . levothyroxine  112 mcg Per Tube QAC breakfast  . liver oil-zinc oxide   Topical BID  . mouth rinse  15 mL Mouth Rinse q12n4p  . [START ON 04/05/2020] modafinil  300 mg Oral Daily  . rosuvastatin  10 mg Per Tube Daily  . sodium chloride flush  3 mL Intravenous Once  . sodium chloride flush  5 mL Intracatheter Q8H   Continuous Infusions: . sodium chloride 10 mL/hr at 03/24/20 0305  . dextrose 5% lactated ringers 50 mL/hr at 04/04/20 1737  . feeding supplement (OSMOLITE 1.2 CAL) Stopped (04/04/20 1304)     LOS: 17 days    Time spent: over 30 min    Lacretia Nicksaldwell Powell, MD [facility]   To contact the attending provider between 7A-7P or the covering provider during after hours 7P-7A, please log into the web site [email or web address] and access using universal [facility] password for that web site. If you do not have the password, please call the hospital  operator.  04/04/2020, 7:36 PM

## 2020-04-04 NOTE — Progress Notes (Signed)
  Speech Language Pathology Treatment: Dysphagia  Patient Details Name: Hannah Fischer MRN: [id number] DOB: 1932/10/01 Today's Date: 04/04/2020 Time: 8638-1771 SLP Time Calculation (min) (ACUTE ONLY): 14 min  Assessment / Plan / Recommendation Clinical Impression  Pt is quite lethargic this morning, not arousing sufficiently to attempt PO intake despite Max multimodal cues. She shows no awareness to the majority of PO trials presented, although she did have some active resistance to oral care and hand-over-hand assist for self-feeding. Recommend that she remain NPO at this time, but will continue to follow. Given fluctuations in mentation, it may be challenging for pt to meet nutritional needs if she is able to begin an oral diet.    HPI HPI: Hannah Fischer is a 87 y.o. female with bilateral medial thalamic and punctate left superior cerebellar infarcts. PMHx significant for multiple CVAs with most recent acute/subacute R frontal lobe and L parietal and occipital lobe infarcts ~3 months ago, residual L-sided hemiplegia, hypothyroidism, GERD, HTN, TIA, DM, PE, anxiety, HTN, and HLD.      SLP Plan  Continue with current plan of care       Recommendations  Diet recommendations: NPO Medication Administration: Via alternative means                Oral Care Recommendations: Oral care QID Follow up Recommendations: Skilled Nursing facility SLP Visit Diagnosis: Dysphagia, oropharyngeal phase (R13.12) Plan: Continue with current plan of care       GO                Mahala Menghini., M.A. CCC-SLP Acute Rehabilitation Services Pager [phone] Office [phone]  04/04/2020, 9:59 AM

## 2020-04-04 NOTE — Progress Notes (Signed)
PHARMACY - PHYSICIAN COMMUNICATION CRITICAL VALUE ALERT - BLOOD CULTURE IDENTIFICATION (BCID)  Hannah Fischer is an 87 y.o. female who presented to [facility] on 03/18/2020 with a chief complaint of stroke  Assessment:  WBC WNL, last temp 98.6  Name of physician (or Provider) Contacted: Dr. Rachael Darby   Current antibiotics: None   Changes to prescribed antibiotics recommended:  Cont to monitor off anti-biotics   Results for orders placed or performed during the hospital encounter of 03/18/20  Blood Culture ID Panel (Reflexed) (Collected: 04/03/2020  8:11 AM)  Result Value Ref Range   Enterococcus faecalis NOT DETECTED NOT DETECTED   Enterococcus Faecium NOT DETECTED NOT DETECTED   Listeria monocytogenes NOT DETECTED NOT DETECTED   Staphylococcus species DETECTED (A) NOT DETECTED   Staphylococcus aureus (BCID) NOT DETECTED NOT DETECTED   Staphylococcus epidermidis DETECTED (A) NOT DETECTED   Staphylococcus lugdunensis NOT DETECTED NOT DETECTED   Streptococcus species NOT DETECTED NOT DETECTED   Streptococcus agalactiae NOT DETECTED NOT DETECTED   Streptococcus pneumoniae NOT DETECTED NOT DETECTED   Streptococcus pyogenes NOT DETECTED NOT DETECTED   A.calcoaceticus-baumannii NOT DETECTED NOT DETECTED   Bacteroides fragilis NOT DETECTED NOT DETECTED   Enterobacterales NOT DETECTED NOT DETECTED   Enterobacter cloacae complex NOT DETECTED NOT DETECTED   Escherichia coli NOT DETECTED NOT DETECTED   Klebsiella aerogenes NOT DETECTED NOT DETECTED   Klebsiella oxytoca NOT DETECTED NOT DETECTED   Klebsiella pneumoniae NOT DETECTED NOT DETECTED   Proteus species NOT DETECTED NOT DETECTED   Salmonella species NOT DETECTED NOT DETECTED   Serratia marcescens NOT DETECTED NOT DETECTED   Haemophilus influenzae NOT DETECTED NOT DETECTED   Neisseria meningitidis NOT DETECTED NOT DETECTED   Pseudomonas aeruginosa NOT DETECTED NOT DETECTED   Stenotrophomonas maltophilia NOT DETECTED NOT DETECTED    Candida albicans NOT DETECTED NOT DETECTED   Candida auris NOT DETECTED NOT DETECTED   Candida glabrata NOT DETECTED NOT DETECTED   Candida krusei NOT DETECTED NOT DETECTED   Candida parapsilosis NOT DETECTED NOT DETECTED   Candida tropicalis NOT DETECTED NOT DETECTED   Cryptococcus neoformans/gattii NOT DETECTED NOT DETECTED   Methicillin resistance mecA/C DETECTED (A) NOT DETECTED    [name] 04/04/2020  5:58 AM

## 2020-04-04 NOTE — Progress Notes (Signed)
Patient is lying in bed comfortably.  Her husband is at the bedside.  Patient is more sleepier today but can be aroused and is interactive to a limited degree.  She follows simple midline commands and speaks a few words .  Goal of care meeting with family yesterday decided on supporting well for few more days before making decision about PEG tube..  Vital signs stable.  Vitals:   04/04/20 0438 04/04/20 0808 04/04/20 1107 04/04/20 1147  BP:  139/77 125/66 127/70  Pulse:  88 83 84  Resp:  (!) 24 (!) 26 (!) 24  Temp:  98.6 F (37 C) (!) 97.5 F (36.4 C) 97.9 F (36.6 C)  TempSrc:  Oral Axillary Oral  SpO2:  100% 100% 100%  Weight: 81.8 kg     Height:       CBC:  Recent Labs  Lab 04/03/20 0135 04/04/20 0351  WBC 6.5 6.4  NEUTROABS 4.3 4.2  HGB 12.2 11.7*  HCT 37.6 36.8  MCV 99.2 99.5  PLT 290 322   Basic Metabolic Panel:  Recent Labs  Lab 04/03/20 0135 04/04/20 0351  NA 136 137  K 5.1 4.9  CL 98 98  CO2 27 28  GLUCOSE 267* 199*  BUN 45* 45*  CREATININE 1.13* 1.15*  CALCIUM 9.6 9.7  MG 2.0 2.1  PHOS 4.9* 4.5   Lipid Panel:  No results for input(s): CHOL, TRIG, HDL, CHOLHDL, VLDL, LDLCALC in the last 168 hours. HgbA1c:  No results for input(s): HGBA1C in the last 168 hours. Urine Drug Screen:  No results for input(s): LABOPIA, COCAINSCRNUR, LABBENZ, AMPHETMU, THCU, LABBARB in the last 168 hours.  Alcohol Level No results for input(s): ETH in the last 168 hours.  IMAGING past 24 hours No results found. PHYSICAL EXAM Physical Exam:     Frail elderly female lying in bed with eyes closed. No verbalization with voice are and tactile stimulation.  Head is nontraumatic.  Resp: Without extra work of breathing  Neurological Exam  Patient is sleepy but can be aroused and opens eyes but will not keep them open for long.  Speech is at times difficult to understand and slurred occasional words and short sentences can be heard.  She follows simple midline few one-step  commands Face appears symmetric.    Tongue midline She moves right upper and both lower extremities purposefully but does not follow commands for formal testing.  Moves left upper extremity less than the right lung.  Gait not tested   ASSESSMENT/PLAN Hannah Fischer is a 87 y.o. female with cerebral amyloid angiopathy, brain aneurysm, embolic stroke 2021, DM2, PVD, PE, HTN obesity who presented after sudden onset of unresponsiveness and altered mental status with possibly some increased left arm weakness of unclear etiology.    Bilateral medial thalamic and punctate left superior cerebellar infarcts possibly from embolism to top of the basilar though involvement of artery of Percheron which may supply both medial thalamus due to congenital variant and technically is a small vessel disease could explain this presentation.  Tiny left cerebellar punctate infarct may also be a concurrent lacune.  However she has had previous history of embolic as well as small vessel infarcts in October 21 hence strong suspicion for underlying paroxysmal A. Fib.   Studies: CT head no acute abnormalities MRI bilateral medial thalamic punctate left superior cerebellar infarct CTA suboptimal due to contrast timing but no LVO.  Diminutive flow in the right vertebral artery MR Brain 2/25 Mildly restricted diffusion  within the bilateral thalami consistent with expected evolution of known infarcts. ECHO on 11/24/2019 ejection fraction 60-65%.  No wall motion abnormalities. LDL 155 mg percent HbA1c 7.6 EEG showed no seizures   LDL 155  HgbA1c 7.6  VTE prophylaxis is recommended    Diet   Diet NPO time specified   Dysphagia with cortrak tube placed for feeding and medications  On plavix prior to admission.   Goals of care discussion underway with husband/family as above. Palliative care following.    Hypertension  Hypertensive to 190 systolic this morning and consistently for past 2 days  . Permissive  hypertension (OK if < 220/120) but gradually normalize in 5-7 days . Long-term BP goal normotensive  Hyperlipidemia  Home meds:  Crestor 5mg    LDL 155, goal < 70  Crestor increased to 10mg  upon admission, will hold in setting of AKI per discussion with pharmacist. Resume when appropriate.   Continue statin at discharge  Diabetes type II Uncontrolled  HgbA1c 7.6, goal < 7.0  CBGs Recent Labs    04/04/20 0429 04/04/20 0810 04/04/20 1151  GLUCAP 195* 165* 170*      SSI   Somnolence/Encephalopathy  Multifactorial. Could be related to thalamic infarcts. In light of worsening renal function and heart failure precaution will reduce Amantadine to 50mg  daily.   Ritalin dced d/t agitation a week ago  If safe from renal standpoint, agitation and blood pressure management concerns consider adding modafinil 100mg  BID    Other Stroke Risk Factors  Advanced Age >/= 69   Hx stroke/TIA  Grade 2 diastolic dysfunction   Other Active Problems  AKI: Baseline Ctn 1.0-1.3  Renal ultrasound reveals moderate to severe Hydronephrosis on the right with possible UP junction stone- Urology placed right nephrostomy tube 2/21  Acute metabolic encephalopathy - persistent  Grade 2 diastolic heart failure-chronic -2D echo during this admission reveals grade 2 diastolic dysfunction Hospital day # 17  Increase Provigil dose to 300 mg daily for wakefulness   Continue ongoing therapies.  Long discussion with husband at the bedside and with Dr. and answered questions.  May need to consider PEG tube if family wants to pursue aggressive care and the mental status and swallowing if not improved in the next few days.  Greater than 50% time during this 25 minute  visit was spent in counseling and coordination of care and discussion with patient, husband and Dr. answering questions.    76, MD To contact Stroke Continuity provider, please refer to 3/21. After hours,  contact General Neurology   Hospital day # 17   04/04/2020 1:17 PM

## 2020-04-05 ENCOUNTER — Inpatient Hospital Stay (HOSPITAL_COMMUNITY): Payer: Medicare Other

## 2020-04-05 DIAGNOSIS — G9340 Encephalopathy, unspecified: Secondary | ICD-10-CM | POA: Diagnosis not present

## 2020-04-05 DIAGNOSIS — N179 Acute kidney failure, unspecified: Secondary | ICD-10-CM | POA: Diagnosis not present

## 2020-04-05 LAB — CBC WITH DIFFERENTIAL/PLATELET
Abs Immature Granulocytes: 0.06 10*3/uL (ref 0.00–0.07)
Basophils Absolute: 0.1 10*3/uL (ref 0.0–0.1)
Basophils Relative: 1 %
Eosinophils Absolute: 0.3 10*3/uL (ref 0.0–0.5)
Eosinophils Relative: 6 %
HCT: 35 % — ABNORMAL LOW (ref 36.0–46.0)
Hemoglobin: 11.3 g/dL — ABNORMAL LOW (ref 12.0–15.0)
Immature Granulocytes: 1 %
Lymphocytes Relative: 20 %
Lymphs Abs: 1.1 10*3/uL (ref 0.7–4.0)
MCH: 32.2 pg (ref 26.0–34.0)
MCHC: 32.3 g/dL (ref 30.0–36.0)
MCV: 99.7 fL (ref 80.0–100.0)
Monocytes Absolute: 0.7 10*3/uL (ref 0.1–1.0)
Monocytes Relative: 12 %
Neutro Abs: 3.3 10*3/uL (ref 1.7–7.7)
Neutrophils Relative %: 60 %
Platelets: UNDETERMINED 10*3/uL (ref 150–400)
RBC: 3.51 MIL/uL — ABNORMAL LOW (ref 3.87–5.11)
RDW: 12.8 % (ref 11.5–15.5)
WBC: 5.5 10*3/uL (ref 4.0–10.5)
nRBC: 0 % (ref 0.0–0.2)

## 2020-04-05 LAB — GLUCOSE, CAPILLARY
Glucose-Capillary: 119 mg/dL — ABNORMAL HIGH (ref 70–99)
Glucose-Capillary: 124 mg/dL — ABNORMAL HIGH (ref 70–99)
Glucose-Capillary: 128 mg/dL — ABNORMAL HIGH (ref 70–99)
Glucose-Capillary: 140 mg/dL — ABNORMAL HIGH (ref 70–99)
Glucose-Capillary: 168 mg/dL — ABNORMAL HIGH (ref 70–99)
Glucose-Capillary: 84 mg/dL (ref 70–99)

## 2020-04-05 LAB — COMPREHENSIVE METABOLIC PANEL
ALT: 12 U/L (ref 0–44)
AST: 19 U/L (ref 15–41)
Albumin: 2.6 g/dL — ABNORMAL LOW (ref 3.5–5.0)
Alkaline Phosphatase: 85 U/L (ref 38–126)
Anion gap: 11 (ref 5–15)
BUN: 42 mg/dL — ABNORMAL HIGH (ref 8–23)
CO2: 27 mmol/L (ref 22–32)
Calcium: 9.8 mg/dL (ref 8.9–10.3)
Chloride: 101 mmol/L (ref 98–111)
Creatinine, Ser: 1.12 mg/dL — ABNORMAL HIGH (ref 0.44–1.00)
GFR, Estimated: 48 mL/min — ABNORMAL LOW (ref >60)
Glucose, Bld: 126 mg/dL — ABNORMAL HIGH (ref 70–99)
Potassium: 4.7 mmol/L (ref 3.5–5.1)
Sodium: 139 mmol/L (ref 135–145)
Total Bilirubin: 0.8 mg/dL (ref 0.3–1.2)
Total Protein: 6.3 g/dL — ABNORMAL LOW (ref 6.5–8.1)

## 2020-04-05 LAB — PHOSPHORUS: Phosphorus: 4 mg/dL (ref 2.5–4.6)

## 2020-04-05 LAB — MAGNESIUM: Magnesium: 2 mg/dL (ref 1.7–2.4)

## 2020-04-05 MED ORDER — MODAFINIL 100 MG PO TABS: 200.0000 mg | ORAL_TABLET | Freq: Every day | ORAL | Status: DC

## 2020-04-05 NOTE — Progress Notes (Signed)
Physical Therapy Treatment Patient Details Name: Hannah Fischer MRN: [id number] DOB: [birth year] Today's Date: 04/05/2020    History of Present Illness 87 y.o. female presenting code stroke. Imaging (+) for bilateral medial thalamic and punctate infarcts and L superior cerebellar infarcts. PMHx significant for multiple CVAs with most recent acute/subacute R frontal lobe and L parietal and occipital lobe infarcts ~3 months ago, residual L-sided hemiplegia with recent d/c from outpatient PT/OT with return to baseline level of function, TIA, DM, PE, anxiety, HTN, and HLD.    PT Comments    Session limited by lethargy. Patient kept eyes closed throughout session and remain closed with cueing. Unable to follow commands this session. Disoriented to place and situation. Patient required totalA+2 for bed mobility and sit to stand with HHAx2. Patient with facial grimacing when attempting to reposition head as it tends to be flexed. Continue to recommend SNF for ongoing Physical Therapy.      Follow Up Recommendations  SNF;Supervision/Assistance - 24 hour     Equipment Recommendations  Wheelchair cushion (measurements PT);Wheelchair (measurements PT);Hospital bed    Recommendations for Other Services       Precautions / Restrictions Precautions Precautions: Fall Precaution Comments: cortrak, bilateral mittens, nephrostomy drain. Restrictions Weight Bearing Restrictions: No    Mobility  Bed Mobility Overal bed mobility: Needs Assistance Bed Mobility: Rolling;Supine to Sit;Sit to Supine Rolling: Total assist   Supine to sit: Total assist;+2 for physical assistance;+2 for safety/equipment Sit to supine: Total assist;+2 for physical assistance;+2 for safety/equipment   General bed mobility comments: totalA+2 2/2 decreased level of arousal. Patient did not attempt to assist or initiate movement    Transfers Overall transfer level: Needs assistance Equipment used: 2 person hand held  assist Transfers: Sit to/from Stand Sit to Stand: Total assist;+2 physical assistance;+2 safety/equipment         General transfer comment: did not initiate movement or assist  Ambulation/Gait                 Stairs             Wheelchair Mobility    Modified Rankin (Stroke Patients Only) Modified Rankin (Stroke Patients Only) Pre-Morbid Rankin Score: No significant disability Modified Rankin: Severe disability     Balance Overall balance assessment: Needs assistance Sitting-balance support: Feet unsupported;No upper extremity supported Sitting balance-Leahy Scale: Poor Sitting balance - Comments: min-modA for sitting balance. forward head posture with no movement initiated with cueing for head extension. Instances of min guard, however heavy lean towards R. Simulated posterior LOB, with patient gripping therapist hands for initiation of correction. Sitting EOB ~15 minutes   Standing balance support: Bilateral upper extremity supported Standing balance-Leahy Scale: Zero Standing balance comment: totalA                            Cognition Arousal/Alertness: Lethargic Behavior During Therapy: Flat affect Overall Cognitive Status: Difficult to assess                                 General Comments: Keeping eyes closed throughout, grimaces to painful stimuli      Exercises      General Comments        Pertinent Vitals/Pain Pain Assessment: Faces Faces Pain Scale: Hurts a little bit Pain Location: with cervical movement Pain Descriptors / Indicators: Grimacing Pain Intervention(s): Monitored during session    Home Living  Prior Function            PT Goals (current goals can now be found in the care plan section) Acute Rehab PT Goals Patient Stated Goal: Unable to state. PT Goal Formulation: Patient unable to participate in goal setting Time For Goal Achievement: 04/17/20 Potential to  Achieve Goals: Fair Progress towards PT goals: Not progressing toward goals - comment    Frequency    Min 3X/week      PT Plan Current plan remains appropriate    Co-evaluation              AM-PAC PT "6 Clicks" Mobility   Outcome Measure  Help needed turning from your back to your side while in a flat bed without using bedrails?: Total Help needed moving from lying on your back to sitting on the side of a flat bed without using bedrails?: Total Help needed moving to and from a bed to a chair (including a wheelchair)?: Total Help needed standing up from a chair using your arms (e.g., wheelchair or bedside chair)?: Total Help needed to walk in hospital room?: Total Help needed climbing 3-5 steps with a railing? : Total 6 Click Score: 6    End of Session Equipment Utilized During Treatment: Oxygen Activity Tolerance: Patient limited by lethargy Patient left: in bed;with call bell/phone within reach;with bed alarm set;with SCD's reapplied Nurse Communication: Mobility status;Need for lift equipment PT Visit Diagnosis: Other symptoms and signs involving the nervous system (R29.898);Difficulty in walking, not elsewhere classified (R26.2);Unsteadiness on feet (R26.81);Muscle weakness (generalized) (M62.81)     Time: 1478-2956 PT Time Calculation (min) (ACUTE ONLY): 26 min  Charges:  $Therapeutic Activity: 23-37 mins                     Rylend Pietrzak A. Dan Humphreys PT, DPT Acute Rehabilitation Services Pager [phone] Office [phone]   Viviann Spare 04/05/2020, 12:17 PM

## 2020-04-05 NOTE — Progress Notes (Signed)
Cortrak Tube Team Note:  Cortrak tube repositioned as abdominal radiograph noted a kink at tube tip and tube with difficulty flushing. Tube retracted and secured at 53cm. Radiographs confirms tube in good place and ready to use.   Betsey Holiday MS, RD, LDN Please refer to [facility] for RD and/or RD on-call/weekend/after hours pager

## 2020-04-05 NOTE — Progress Notes (Addendum)
Patient is lying in bed and core track nurses adjusting her tube.  Her son is at the bedside.  Patient remains sleepy today but can be aroused and is interactive to a limited degree.  She does not follow given simple midline commands and speaks a few words .   ..  Vital signs stable.  Patient appears more restless today with increased tremulousness.  Vitals:   04/04/20 2327 04/05/20 0359 04/05/20 0500 04/05/20 0734  BP: 129/70 (!) 150/83  120/89  Pulse: 89 89  81  Resp: 19 19  (!) 24  Temp: 99.3 F (37.4 C) 98.5 F (36.9 C)  98.4 F (36.9 C)  TempSrc: Oral Oral  Oral  SpO2: 98% 97%  100%  Weight:   79.5 kg   Height:       CBC:  Recent Labs  Lab 04/04/20 0351 04/05/20 0322  WBC 6.4 5.5  NEUTROABS 4.2 3.3  HGB 11.7* 11.3*  HCT 36.8 35.0*  MCV 99.5 99.7  PLT 322 PLATELET CLUMPS NOTED ON SMEAR, UNABLE TO ESTIMATE   Basic Metabolic Panel:  Recent Labs  Lab 04/04/20 0351 04/05/20 0322  NA 137 139  K 4.9 4.7  CL 98 101  CO2 28 27  GLUCOSE 199* 126*  BUN 45* 42*  CREATININE 1.15* 1.12*  CALCIUM 9.7 9.8  MG 2.1 2.0  PHOS 4.5 4.0   Lipid Panel:  No results for input(s): CHOL, TRIG, HDL, CHOLHDL, VLDL, LDLCALC in the last 168 hours. HgbA1c:  No results for input(s): HGBA1C in the last 168 hours. Urine Drug Screen:  No results for input(s): LABOPIA, COCAINSCRNUR, LABBENZ, AMPHETMU, THCU, LABBARB in the last 168 hours.  Alcohol Level No results for input(s): ETH in the last 168 hours.  IMAGING past 24 hours No results found. PHYSICAL EXAM Physical Exam:     Frail elderly female lying in bed with eyes closed. No verbalization with voice are and tactile stimulation.  Head is nontraumatic.  Resp: Without extra work of breathing  Neurological Exam  Patient is sleepy but can be aroused and opens eyes but will not keep them open for long.  Speech is at times difficult to understand and slurred occasional words and short sentences can be heard.  She follows simple midline few  one-step commands Face appears symmetric.    Tongue midline She moves right upper and both lower extremities purposefully but does not follow commands for formal testing.  Moves left upper extremity less than the right lung.  Gait not tested   ASSESSMENT/PLAN Ms. GENNIFER POTENZA is a 87 y.o. female with cerebral amyloid angiopathy, brain aneurysm, embolic stroke 2021, DM2, PVD, PE, HTN obesity who presented after sudden onset of unresponsiveness and altered mental status with possibly some increased left arm weakness of unclear etiology.    Bilateral medial thalamic and punctate left superior cerebellar infarcts possibly from embolism to top of the basilar though involvement of artery of P[place]heron which may supply both medial thalamus due to congenital variant and technically is a small vessel disease could explain this presentation.  Tiny left cerebellar punctate infarct may also be a concurrent lacune.  However she has had previous history of embolic as well as small vessel infarcts in October 21 hence strong suspicion for underlying paroxysmal A. Fib.   Studies: CT head no acute abnormalities MRI bilateral medial thalamic punctate left superior cerebellar infarct CTA suboptimal due to contrast timing but no LVO.  Diminutive flow in the right vertebral artery MR Brain 2/25  Mildly restricted diffusion within the bilateral thalami consistent with expected evolution of known infarcts. ECHO on 11/24/2019 ejection fraction 60-65%.  No wall motion abnormalities. LDL 155 mg percent HbA1c 7.6 EEG showed no seizures   LDL 155  HgbA1c 7.6  VTE prophylaxis is recommended    Diet   Diet NPO time specified   Dysphagia with cortrak tube placed for feeding and medications  On plavix prior to admission.   Goals of care discussion underway with husband/family as above. Palliative care following.    Hypertension  Hypertensive to 190 systolic this morning and consistently for past 2 days   . Permissive hypertension (OK if < 220/120) but gradually normalize in 5-7 days . Long-term BP goal normotensive  Hyperlipidemia  Home meds:  Crestor 5mg    LDL 155, goal < 70  Crestor increased to 10mg  upon admission, will hold in setting of AKI per discussion with pharmacist. Resume when appropriate.   Continue statin at discharge  Diabetes type II Uncontrolled  HgbA1c 7.6, goal < 7.0  CBGs Recent Labs    04/05/20 0359 04/05/20 0743 04/05/20 1305  GLUCAP 140* 124* 128*      SSI   Somnolence/Encephalopathy  Multifactorial. Could be related to thalamic infarcts. In light of worsening renal function and heart failure precaution will reduce Amantadine to 50mg  daily.   Ritalin dced d/t agitation a week ago  If safe from renal standpoint, agitation and blood pressure management concerns consider adding modafinil 100mg  BID    Other Stroke Risk Factors  Advanced Age >/= 67   Hx stroke/TIA  Grade 2 diastolic dysfunction   Other Active Problems  AKI: Baseline Ctn 1.0-1.3  Renal ultrasound reveals moderate to severe Hydronephrosis on the right with possible UP junction stone- Urology placed right nephrostomy tube 2/21  Acute metabolic encephalopathy - persistent  Grade 2 diastolic heart failure-chronic -2D echo during this admission reveals grade 2 diastolic dysfunction Hospital day # 18  Increasing Provigil dose to 300 mg daily for wakefulness does not appear to have shown any added benefit.  She appears more anxious and tremulous hence we will reduce the dose of Provigil to 200 mg daily and discontinue amantadine continue ongoing therapies.  Long discussion with son at the bedside and with Dr. and answered questions.  May need to consider PEG tube if family wants to pursue aggressive care since mental status and swallowing is not improving quickly enough..  Greater than 50% time during this 25 minute  visit was spent in counseling and coordination of  care and discussion with patient, husband and Dr.Gherghe answering questions.    , MD To contact Stroke Continuity provider, please refer to 76. After hours, contact General Neurology   Hospital day # 18   04/05/2020 1:37 PM

## 2020-04-05 NOTE — Procedures (Addendum)
Cortrak  Tube Type:  Cortrak - 43 inches Tube Location:  Right nare Initial Placement:  Stomach Secured by: Bridle Technique Used to Measure Tube Placement:  Documented cm marking at nare/ corner of mouth Cortrak Secured At:  53 cm    Cortrak Tube Team Note:  Consult received to place a Cortrak feeding tube.   X-ray is required, abdominal x-ray has been ordered by the Cortrak team. Please confirm tube placement before using the Cortrak tube.   If the tube becomes dislodged please keep the tube and contact the Cortrak team at [email or web address] (password [email or web address]) for replacement.  If after hours and replacement cannot be delayed, place a NG tube and confirm placement with an abdominal x-ray.    Betsey Holiday MS, RD, LDN Please refer to [facility] for RD and/or RD on-call/weekend/after hours pager

## 2020-04-05 NOTE — Progress Notes (Signed)
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 She had gone to bedin the morning her husband had trouble waking her up.  In the ED she was noted to be responsive to pain. A code stroke was called. She was outside the window for TPA. An emergent CT did not show any acute abnormality. A CTA was performed and did not reveal any obvious stenosis.  An MRI of the brain was performed on 2/13:Punctate acute infarction in the left superior cerebellumadjacent to the superior cerebellar peduncle. Bilateral thalamic acute infarction extending back into the mid brain. This is consistent with artery of Percheron infarction.  She continues to wax and wane from a mental status standpoint, 3/1 being minimally interactive/responsive.  She has cortrak.  Palliative and neuro are following, may need peg if no improvement towards end of week.  Subjective / 24h Interval events: Sleeping, does not respond much  Assessment & Plan: Principal Problem Bilateral Thalamic Infarcts Acute metabolic encephalopathy -mental status waxing and waning, at times she is minimally responsive versus talking some.  Neurology following, she has been placed on Provigil, dose increased recently.  Currently on amantadine also.  She has a prior history of embolic infarcts -Currently patient has a core track for feeding, needs to be advanced today.  She has a prior history of -further plans for PEG vs comfort care pending additional GOC discussions  -asymmetric pupils -> repeat MRI 2/25 with no new acute infarct, expected evolution of known infarcts -therapy exam limited by lethargy 2/25 (see note) -EEG 2/13 with  moderate diffuse encephalopathy  Active Problems S/p NG tube  -has cortrak which was dislodged this AM -> KUB with catheter tip at GE junction - will hold tube feeds for now and try to advance tube (cortrak team here today 3/2).  Dextrose containing fluids until cortrak advanced.  Fever -Fever on 2/27, CXR with bibasilar opacities, urine culture showed yeast and she was started on fluconazole. Blood cultures 1/2 with staph epidermidis (likely contaminated) -Will hold additional abx at this time  Intermittent agitation -waking up from her brain injury and agitated -will liberalize visitation so family can help sit with her  CKD 3A- AKI due to dehydration   Right Sided Hydronephrosis  -Baseline creatinine appears to be 1.0-1.3 -started free water per tube and watching I & O closely to prevent heart failure exacerbation -BUN & Cr continued to rise despite adequate hydration, 2/19>-Cr rose to 2.15- Ordered arenal ultrasound whichshowed right sided hydronephrosis (no mass or definite calcification identified).  Nephrology, urology consulted and she is status post right nephrostomy placement by IR on 2/21.  Creatinine has now returned to her baseline  UTI  -GBS UTI from 2/12 appears to have been treated with ceftriaxone.  2/27 yeast UTI, will start fluconazole, plan for total of 10 days.  Afebrile, normal WBC count.  Follow.  Follow repeat urine cx as noted above  Grade 2 diastolic heart failure-chronic -2D echoperformedduring this admission reveals grade 2 diastolic dysfunction -following fluid status closely  Gluteal skin breakdown -[place] and barrier cream -will switch to an air mattress to prevent further skin breakdown -turn as able   Diabetes mellitus 2 -Continue on insulin sliding scale every 4 hours  while she is on tube feeds -basal insulin, bolus insulin - adjust prn  Hypothyroidism -Continue Synthroid per tube  Hyperkalemia -Potassium normal this  morning  Scheduled Meds:  amantadine  50 mg Per Tube Daily   amLODipine  5 mg Per Tube Daily   chlorhexidine  15 mL Mouth Rinse BID   Chlorhexidine Gluconate Cloth  6 each Topical Daily   clopidogrel  75 mg Per Tube Q breakfast   feeding supplement (PROSource TF)  45 mL Per Tube TID   fluconazole  100 mg Per Tube Q24H   free water  200 mL Per Tube Q6H   heparin  5,000 Units Subcutaneous Q8H   insulin aspart  0-9 Units Subcutaneous Q4H   insulin aspart  2 Units Subcutaneous Q4H   insulin detemir  5 Units Subcutaneous Daily   levothyroxine  112 mcg Per Tube QAC breakfast   liver oil-zinc oxide   Topical BID   mouth rinse  15 mL Mouth Rinse q12n4p   modafinil  300 mg Oral Daily   rosuvastatin  10 mg Per Tube Daily   sodium chloride flush  3 mL Intravenous Once   sodium chloride flush  5 mL Intracatheter Q8H   Continuous Infusions:  sodium chloride 10 mL/hr at 03/24/20 0305   dextrose 5% lactated ringers 50 mL/hr at 04/04/20 1737   feeding supplement (OSMOLITE 1.2 CAL) Stopped (04/04/20 1304)   PRN Meds:.acetaminophen, docusate, hydroxypropyl methylcellulose / hypromellose, iohexol, polyethylene glycol  Diet Orders (From admission, onward)    Start     Ordered   03/27/20 0001  Diet NPO time specified  Diet effective midnight       Comments: Stop tube feeds at midnight   03/26/20 1237          DVT prophylaxis: heparin injection 5,000 Units Start: 03/28/20 0600 SCDs Start: 03/18/20 1733     Code Status: DNR  Family Communication: No family at bedside  Status is: Inpatient  Remains inpatient appropriate because:Inpatient level of care appropriate due to severity of illness   Dispo: The patient is from: Home              Anticipated d/c is to: Home              Patient currently is not medically stable to d/c.   Difficult to place patient No  Level of care: Progressive  Consultants:  Neurology  Palliative  care Nephrology Urology IR  Procedures:  Right percutaneous nephrostomy 2/21  Microbiology  Urine cultures - yeast   Antimicrobials: Fluconazole 2/27   Objective: Vitals:   04/04/20 2327 04/05/20 0359 04/05/20 0500 04/05/20 0734  BP: 129/70 (!) 150/83  120/89  Pulse: 89 89  81  Resp: 19 19  (!) 24  Temp: 99.3 F (37.4 C) 98.5 F (36.9 C)  98.4 F (36.9 C)  TempSrc: Oral Oral  Oral  SpO2: 98% 97%  100%  Weight:   79.5 kg   Height:        Intake/Output Summary (Last 24 hours) at 04/05/2020 1038 Last data filed at 04/05/2020 0500 Gross per 24 hour  Intake 5 ml  Output 2350 ml  Net -2345 ml   Filed Weights   03/30/20 0309 04/04/20 0438 04/05/20 0500  Weight: 78.9 kg 81.8 kg 79.5 kg    Examination:  Constitutional: Unresponsive this morning Eyes: no scleral icterus ENMT: Mucous membranes are moist.  Neck: normal, supple Respiratory: clear to auscultation bilaterally, no wheezing, no crackles. Normal respiratory effort.  Cardiovascular: Regular rate and rhythm, no murmurs / rubs / gallops. No LE edema. Good peripheral pulses Abdomen: non distended, no tenderness. Bowel sounds positive.  Musculoskeletal: no clubbing / cyanosis.  Skin: no rashes Neurologic: Poorly responsive  Data Reviewed: I have independently reviewed following labs and imaging studies   CBC: Recent Labs  Lab 04/01/20 0257 04/02/20 0246 04/03/20 0135 04/04/20 0351 04/05/20 0322  WBC 8.2 6.8 6.5 6.4 5.5  NEUTROABS 6.0 4.6 4.3 4.2 3.3  HGB 12.1 11.4* 12.2 11.7* 11.3*  HCT 36.4 35.1* 37.6 36.8 35.0*  MCV 98.6 98.3 99.2 99.5 99.7  PLT 315 293 290 322 PLATELET CLUMPS NOTED ON SMEAR, UNABLE TO ESTIMATE   Basic Metabolic Panel: Recent Labs  Lab 04/01/20 0257 04/02/20 0246 04/03/20 0135 04/04/20 0351 04/05/20 0322  NA 138 138 136 137 139  K 5.2* 4.7 5.1 4.9 4.7  CL 100 97* 98 98 101  CO2 26 30 27 28 27   GLUCOSE 206* 224* 267* 199* 126*  BUN 34* 37* 45* 45* 42*  CREATININE 1.04*  1.07* 1.13* 1.15* 1.12*  CALCIUM 9.6 9.5 9.6 9.7 9.8  MG 1.7 1.8 2.0 2.1 2.0  PHOS 4.4 4.2 4.9* 4.5 4.0   Liver Function Tests: Recent Labs  Lab 04/01/20 0257 04/02/20 0246 04/03/20 0135 04/04/20 0351 04/05/20 0322  AST 20 17 19 19 19   ALT 13 13 13 14 12   ALKPHOS 75 80 86 87 85  BILITOT 0.6 0.4 0.6 0.6 0.8  PROT 6.5 6.4* 6.5 6.5 6.3*  ALBUMIN 2.9* 2.6* 2.7* 2.6* 2.6*   Coagulation Profile: No results for input(s): INR, PROTIME in the last 168 hours. HbA1C: No results for input(s): HGBA1C in the last 72 hours. CBG: Recent Labs  Lab 04/04/20 1529 04/04/20 2010 04/04/20 2326 04/05/20 0359 04/05/20 0743  GLUCAP 121* 87 167* 140* 124*    Recent Results (from the past 240 hour(s))  Culture, blood (routine x 2)     Status: None (Preliminary result)   Collection Time: 04/03/20  7:55 AM   Specimen: BLOOD RIGHT HAND  Result Value Ref Range Status   Specimen Description BLOOD RIGHT HAND  Final   Special Requests   Final    BOTTLES DRAWN AEROBIC ONLY Blood Culture results may not be optimal due to an inadequate volume of blood received in culture bottles   Culture   Final    NO GROWTH 1 DAY Performed at [facility], [address] [place], [facility] [address]    Report Status PENDING  Incomplete  Culture, blood (routine x 2)     Status: Abnormal (Preliminary result)   Collection Time: 04/03/20  8:11 AM   Specimen: BLOOD LEFT ARM  Result Value Ref Range Status   Specimen Description BLOOD LEFT ARM  Final   Special Requests   Final    BOTTLES DRAWN AEROBIC AND ANAEROBIC Blood Culture adequate volume   Culture  Setup Time   Final    GRAM POSITIVE COCCI Organism ID to follow CRITICAL RESULT CALLED TO, READ BACK BY AND VERIFIED WITH: J LEDFORD PHARMD 04/04/20 0554 JDW IN BOTH AEROBIC AND ANAEROBIC BOTTLES    Culture (A)  Final    STAPHYLOCOCCUS EPIDERMIDIS THE SIGNIFICANCE OF ISOLATING THIS ORGANISM FROM A SINGLE SET OF BLOOD CULTURES WHEN MULTIPLE SETS ARE DRAWN IS  UNCERTAIN. PLEASE NOTIFY THE MICROBIOLOGY DEPARTMENT WITHIN ONE WEEK IF SPECIATION AND SENSITIVITIES ARE REQUIRED. Performed at [facility], [address] [place], [facility] [address]  Report Status PENDING  Incomplete  Blood Culture ID Panel (Reflexed)     Status: Abnormal   Collection Time: 04/03/20  8:11 AM  Result Value Ref Range Status   Enterococcus faecalis NOT DETECTED NOT DETECTED Final   Enterococcus Faecium NOT DETECTED NOT DETECTED Final   Listeria monocytogenes NOT DETECTED NOT DETECTED Final   Staphylococcus species DETECTED (A) NOT DETECTED Final    Comment: CRITICAL RESULT CALLED TO, READ BACK BY AND VERIFIED WITH: J LEDFORD PHARMD 04/04/20 0554 JDW    Staphylococcus aureus (BCID) NOT DETECTED NOT DETECTED Final   Staphylococcus epidermidis DETECTED (A) NOT DETECTED Final    Comment: Methicillin (oxacillin) resistant coagulase negative staphylococcus. Possible blood culture contaminant (unless isolated from more than one blood culture draw or clinical case suggests pathogenicity). No antibiotic treatment is indicated for blood  culture contaminants. CRITICAL RESULT CALLED TO, READ BACK BY AND VERIFIED WITH: J LEDFORD [facility] 04/04/20 0554 JDW    Staphylococcus lugdunensis NOT DETECTED NOT DETECTED Final   Streptococcus species NOT DETECTED NOT DETECTED Final   Streptococcus agalactiae NOT DETECTED NOT DETECTED Final   Streptococcus pneumoniae NOT DETECTED NOT DETECTED Final   Streptococcus pyogenes NOT DETECTED NOT DETECTED Final   A.calcoaceticus-baumannii NOT DETECTED NOT DETECTED Final   Bacteroides fragilis NOT DETECTED NOT DETECTED Final   Enterobacterales NOT DETECTED NOT DETECTED Final   Enterobacter cloacae complex NOT DETECTED NOT DETECTED Final   Escherichia coli NOT DETECTED NOT DETECTED Final   Klebsiella aerogenes NOT DETECTED NOT DETECTED Final   Klebsiella oxytoca NOT DETECTED NOT DETECTED Final   Klebsiella pneumoniae NOT DETECTED NOT DETECTED  Final   Proteus species NOT DETECTED NOT DETECTED Final   Salmonella species NOT DETECTED NOT DETECTED Final   Serratia marcescens NOT DETECTED NOT DETECTED Final   Haemophilus influenzae NOT DETECTED NOT DETECTED Final   Neisseria meningitidis NOT DETECTED NOT DETECTED Final   Pseudomonas aeruginosa NOT DETECTED NOT DETECTED Final   Stenotrophomonas maltophilia NOT DETECTED NOT DETECTED Final   Candida albicans NOT DETECTED NOT DETECTED Final   Candida auris NOT DETECTED NOT DETECTED Final   Candida glabrata NOT DETECTED NOT DETECTED Final   Candida krusei NOT DETECTED NOT DETECTED Final   Candida parapsilosis NOT DETECTED NOT DETECTED Final   Candida tropicalis NOT DETECTED NOT DETECTED Final   Cryptococcus neoformans/gattii NOT DETECTED NOT DETECTED Final   Methicillin resistance mecA/C DETECTED (A) NOT DETECTED Final    Comment: CRITICAL RESULT CALLED TO, READ BACK BY AND VERIFIED WITHShela Commons [facility] 04/04/20 0554 JDW Performed at [facility], [address] [place], [place] [address]      Radiology Studies: DG Abd 1 View  Result Date: 04/04/2020 CLINICAL DATA:  Enteric catheter placement EXAM: ABDOMEN - 1 VIEW COMPARISON:  03/30/2020, 04/03/2020 FINDINGS: Frontal view of the lower chest and upper abdomen demonstrates stable position of the enteric catheter, tip overlying the gastroesophageal junction. Right-sided nephrostomy tube not appreciably changed. Dense calcification mitral annulus again noted. Bowel gas pattern is unremarkable. IMPRESSION: 1. Enteric catheter tip projecting over the gastroesophageal junction. 2. Stable right nephrostomy tube. Electronically Signed   By: Sharlet Salina M.D.   On: 04/04/2020 15:23    Pamella Pert, MD, PhD [facility]  Between 7 am - 7 pm I am available, please contact me via Amion or Securechat  Between 7 pm - 7 am I am not available, please contact night coverage MD/APP via Amion

## 2020-04-06 DIAGNOSIS — N179 Acute kidney failure, unspecified: Secondary | ICD-10-CM | POA: Diagnosis not present

## 2020-04-06 DIAGNOSIS — G9340 Encephalopathy, unspecified: Secondary | ICD-10-CM | POA: Diagnosis not present

## 2020-04-06 LAB — GLUCOSE, CAPILLARY
Glucose-Capillary: 112 mg/dL — ABNORMAL HIGH (ref 70–99)
Glucose-Capillary: 202 mg/dL — ABNORMAL HIGH (ref 70–99)
Glucose-Capillary: 169 mg/dL — ABNORMAL HIGH (ref 70–99)
Glucose-Capillary: 188 mg/dL — ABNORMAL HIGH (ref 70–99)
Glucose-Capillary: 182 mg/dL — ABNORMAL HIGH (ref 70–99)
Glucose-Capillary: 237 mg/dL — ABNORMAL HIGH (ref 70–99)

## 2020-04-06 LAB — COMPREHENSIVE METABOLIC PANEL
ALT: 15 U/L (ref 0–44)
AST: 22 U/L (ref 15–41)
Albumin: 2.5 g/dL — ABNORMAL LOW (ref 3.5–5.0)
Alkaline Phosphatase: 85 U/L (ref 38–126)
Anion gap: 11 (ref 5–15)
BUN: 40 mg/dL — ABNORMAL HIGH (ref 8–23)
CO2: 27 mmol/L (ref 22–32)
Calcium: 9.8 mg/dL (ref 8.9–10.3)
Chloride: 101 mmol/L (ref 98–111)
Creatinine, Ser: 1.08 mg/dL — ABNORMAL HIGH (ref 0.44–1.00)
GFR, Estimated: 50 mL/min — ABNORMAL LOW (ref >60)
Glucose, Bld: 104 mg/dL — ABNORMAL HIGH (ref 70–99)
Potassium: 4.4 mmol/L (ref 3.5–5.1)
Sodium: 139 mmol/L (ref 135–145)
Total Bilirubin: 0.5 mg/dL (ref 0.3–1.2)
Total Protein: 6.2 g/dL — ABNORMAL LOW (ref 6.5–8.1)

## 2020-04-06 LAB — CBC
HCT: 34.3 % — ABNORMAL LOW (ref 36.0–46.0)
Hemoglobin: 11.2 g/dL — ABNORMAL LOW (ref 12.0–15.0)
MCH: 32.4 pg (ref 26.0–34.0)
MCHC: 32.7 g/dL (ref 30.0–36.0)
MCV: 99.1 fL (ref 80.0–100.0)
Platelets: 317 10*3/uL (ref 150–400)
RBC: 3.46 MIL/uL — ABNORMAL LOW (ref 3.87–5.11)
RDW: 12.8 % (ref 11.5–15.5)
WBC: 5.6 10*3/uL (ref 4.0–10.5)
nRBC: 0 % (ref 0.0–0.2)

## 2020-04-06 MED ORDER — MODAFINIL 100 MG PO TABS
200.0000 mg | ORAL_TABLET | Freq: Every day | ORAL | Status: DC
  Administered 2020-04-06 – 2020-05-01 (×26): 200 mg
  Filled 2020-04-06 (×26): qty 2

## 2020-04-06 NOTE — Progress Notes (Signed)
  Speech Language Pathology Treatment: Dysphagia  Patient Details Name: Hannah Fischer MRN: [id number] DOB: 02-13-1932 Today's Date: 04/06/2020 Time: 7096-2836 SLP Time Calculation (min) (ACUTE ONLY): 15 min  Assessment / Plan / Recommendation Clinical Impression  Pt seen for brief tx session d/t lethargy with PO attempts with ice chips/1/4 tsp thin liquids trialed with anterior loss, decreased oral manipulation, oral holding and delay in the initiation of the swallow observed prior to swallow being initiated with all consistencies despite mod-max verbal/tactile cues given. Pt's granddaughter present for session with education provided re: PO readiness with agreement stated.  Cortrak in place for nutritional/hydration support.  PO readiness goals to continue with ST in acute setting.     HPI HPI: Hannah Fischer is a 86 y.o. female with bilateral medial thalamic and punctate left superior cerebellar infarcts. PMHx significant for multiple CVAs with most recent acute/subacute R frontal lobe and L parietal and occipital lobe infarcts ~3 months ago, residual L-sided hemiplegia, hypothyroidism, GERD, HTN, TIA, DM, PE, anxiety, HTN, and HLD.      SLP Plan  Continue with current plan of care       Recommendations  Diet recommendations: NPO Medication Administration: Via alternative means                Oral Care Recommendations: Oral care QID Follow up Recommendations: Skilled Nursing facility SLP Visit Diagnosis: Dysphagia, oropharyngeal phase (R13.12) Plan: Continue with current plan of care                      Tressie Stalker, M.S., CCC-SLP 04/06/2020, 10:06 AM

## 2020-04-06 NOTE — Progress Notes (Signed)
Her husband and granddaughter at the bedside.  She continues to be sleepy but can be aroused and can have little conversation with family and answer her name and speak short sentences but remains sleepy most of the time.  She is still not able to swallow and will likely need a PEG tube but husband states he needs to talk to his family and will let us know is definitely months or later today.  Vital signs stable.  Neuro exam unchanged.  She is less tremulous today.  Vitals:   04/05/20 2335 04/06/20 0343 04/06/20 0833 04/06/20 1320  BP: 130/65 136/69 (!) 152/81 119/76  Pulse: 69 81 88 87  Resp: 19 19 20 15   Temp: 99.3 F (37.4 C) 99.4 F (37.4 C) 98.9 F (37.2 C) 99.2 F (37.3 C)  TempSrc: Oral Oral Oral Oral  SpO2: 100% 97% 100% 100%  Weight:      Height:       CBC:  Recent Labs  Lab 04/04/20 0351 04/05/20 0322 04/06/20 0336  WBC 6.4 5.5 5.6  NEUTROABS 4.2 3.3  --   HGB 11.7* 11.3* 11.2*  HCT 36.8 35.0* 34.3*  MCV 99.5 99.7 99.1  PLT 322 PLATELET CLUMPS NOTED ON SMEAR, UNABLE TO ESTIMATE 317   Basic Metabolic Panel:  Recent Labs  Lab 04/04/20 0351 04/05/20 0322 04/06/20 0336  NA 137 139 139  K 4.9 4.7 4.4  CL 98 101 101  CO2 28 27 27   GLUCOSE 199* 126* 104*  BUN 45* 42* 40*  CREATININE 1.15* 1.12* 1.08*  CALCIUM 9.7 9.8 9.8  MG 2.1 2.0  --   PHOS 4.5 4.0  --    Lipid Panel:  No results for input(s): CHOL, TRIG, HDL, CHOLHDL, VLDL, LDLCALC in the last 168 hours. HgbA1c:  No results for input(s): HGBA1C in the last 168 hours. Urine Drug Screen:  No results for input(s): LABOPIA, COCAINSCRNUR, LABBENZ, AMPHETMU, THCU, LABBARB in the last 168 hours.  Alcohol Level No results for input(s): ETH in the last 168 hours.  IMAGING past 24 hours DG Abd Portable 1V  Result Date: 04/05/2020 CLINICAL DATA:  Feeding tube placement EXAM: PORTABLE ABDOMEN - 1 VIEW COMPARISON:  Plain film from earlier same day. FINDINGS: Weighted tip feeding tube in place with tip now directed  towards the expected region of the stomach pylorus/duodenal bulb. No dilated bowel loops are appreciated. IMPRESSION: Weighted tip feeding tube in place, repositioned compared to today's earlier exam, with tip now directed towards the expected region of the stomach pylorus/duodenal bulb. Electronically Signed   By: M.D.   On: 04/05/2020 16:56   PHYSICAL EXAM Physical Exam:     Frail elderly female lying in bed with eyes closed. No verbalization with voice are and tactile stimulation.  Head is nontraumatic.  Resp: Without extra work of breathing  Neurological Exam  Patient is sleepy but can be aroused and opens eyes but will not keep them open for long.  Speech is at times difficult to understand and slurred occasional words and short sentences can be heard.  She follows simple midline few one-step commands Face appears symmetric.    Tongue midline She moves right upper and both lower extremities purposefully but does not follow commands for formal testing.  Moves left upper extremity less than the right lung.  Gait not tested   ASSESSMENT/PLAN Hannah Fischer is a 87 y.o. female with cerebral amyloid angiopathy, brain aneurysm, embolic stroke 2021, DM2, PVD, PE, HTN obesity  who presented after sudden onset of unresponsiveness and altered mental status with possibly some increased left arm weakness of unclear etiology.    Bilateral medial thalamic and punctate left superior cerebellar infarcts possibly from embolism to top of the basilar though involvement of artery of Percheron which may supply both medial thalamus due to congenital variant and technically is a small vessel disease could explain this presentation.  Tiny left cerebellar punctate infarct may also be a concurrent lacune.  However she has had previous history of embolic as well as small vessel infarcts in October 21 hence strong suspicion for underlying paroxysmal A. Fib.   Studies: CT head no acute  abnormalities MRI bilateral medial thalamic punctate left superior cerebellar infarct CTA suboptimal due to contrast timing but no LVO.  Diminutive flow in the right vertebral artery MR Brain 2/25 Mildly restricted diffusion within the bilateral thalami consistent with expected evolution of known infarcts. ECHO on 11/24/2019 ejection fraction 60-65%.  No wall motion abnormalities. LDL 155 mg percent HbA1c 7.6 EEG showed no seizures   LDL 155  HgbA1c 7.6  VTE prophylaxis is recommended    Diet   Diet NPO time specified   Dysphagia with cortrak tube placed for feeding and medications  On plavix prior to admission.   Goals of care discussion underway with husband/family as above. Palliative care following.    Hypertension  Hypertensive to 190 systolic this morning and consistently for past 2 days  . Permissive hypertension (OK if < 220/120) but gradually normalize in 5-7 days . Long-term BP goal normotensive  Hyperlipidemia  Home meds:  Crestor 5mg    LDL 155, goal < 70  Crestor increased to 10mg  upon admission, will hold in setting of AKI per discussion with pharmacist. Resume when appropriate.   Continue statin at discharge  Diabetes type II Uncontrolled  HgbA1c 7.6, goal < 7.0  CBGs Recent Labs    04/06/20 0343 04/06/20 0831 04/06/20 1200  GLUCAP 112* 202* 169*      SSI   Somnolence/Encephalopathy  Multifactorial. Could be related to thalamic infarcts. In light of worsening renal function and heart failure precaution will reduce Amantadine to 50mg  daily.   Ritalin dced d/t agitation a week ago  If safe from renal standpoint, agitation and blood pressure management concerns consider adding modafinil 100mg  BID    Other Stroke Risk Factors  Advanced Age >/= 18   Hx stroke/TIA  Grade 2 diastolic dysfunction   Other Active Problems  AKI: Baseline Ctn 1.0-1.3  Renal ultrasound reveals moderate to severe Hydronephrosis on the right with  possible UP junction stone- Urology placed right nephrostomy tube 2/21  Acute metabolic encephalopathy - persistent  Grade 2 diastolic heart failure-chronic -2D echo during this admission reveals grade 2 diastolic dysfunction Hospital day # 19  Patient's mental status and wakefulness remain quite poor and she is showing minimal response to Provigil and was not able to tolerate a higher dose..  Long discussion with her husband and granddaughter at the bedside and with Dr. and answered questions.  I recommend r PEG tube if family wants to pursue aggressive care since mental status and swallowing is not improving quickly enough..  Greater than 50% time during this 25 minute  visit was spent in counseling and coordination of care and discussion with patient, husband and Dr.Gherghe answering questions.    , MD To contact Stroke Continuity provider, please refer to 76. After hours, contact General Neurology   Hospital day # 19  04/06/2020 1:51 PM

## 2020-04-06 NOTE — Progress Notes (Signed)
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 She had gone to bedin the morning her husband had trouble waking her up.  In the ED she was noted to be responsive to pain. A code stroke was called. She was outside the window for TPA. An emergent CT did not show any acute abnormality. A CTA was performed and did not reveal any obvious stenosis.  An MRI of the brain was performed on 2/13:Punctate acute infarction in the left superior cerebellumadjacent to the superior cerebellar peduncle. Bilateral thalamic acute infarction extending back into the mid brain. This is consistent with artery of Percheron infarction.  She continues to wax and wane from a mental status standpoint, 3/1 being minimally interactive/responsive.  She has cortrak.  Palliative and neuro are following, may need peg if no improvement towards end of week.  Subjective / 24h Interval events: Remains poorly responsive  Assessment & Plan: Principal Problem Bilateral Thalamic Infarcts Acute metabolic encephalopathy -due to CVA, mental status waxing and waning, at times she is minimally responsive versus talking some.  Neurology following, she has been placed on Provigil, dose increased initially but then had to be decreased due to increased tremulousness.  She was given a trial of amantadine but it was discontinued.  She has a prior history of embolic infarcts.  Imaging on admission showed punctate acute infarction in the left superior cerebellum adjacent to the superior cerebellar peduncle.  Bilateral thalamic acute infarction extending back into the midbrain, consistent with artery of Percheron infarction.   She remains intermittently encephalopathic.  She had an event with asymmetric pupils on 2/25, had a repeat MRI without any new acute findings and expected evolution of known infarcts.  EEG 2/13 with moderate diffuse encephalopathy -Currently poorly responsive, has a core track for feeding, family discussions ongoing regarding goals of care and whether they wish to pursue a PEG tube  Active Problems Dysphagia-due to stroke, poorly responsive, on core track.  Fever -Fever on 2/27, CXR with bibasilar opacities, urine culture showed yeast and she was started on fluconazole. Blood cultures 1/2 with staph epidermidis (likely contaminated) -Will hold additional abx at this time  Intermittent agitation -waking up from her brain injury and agitated -will liberalize visitation so family can help sit with her  CKD 3A- AKI due to dehydration  Right Sided Hydronephrosis  -Baseline creatinine appears to be 1.0-1.3 -started free water per tube and watching I & O closely to prevent heart failure exacerbation -BUN & Cr continued to rise despite adequate hydration, 2/19>-Cr rose to 2.15- Ordered arenal ultrasound whichshowed right sided hydronephrosis (no mass or definite calcification identified).  Nephrology, urology consulted and she is status post right nephrostomy placement by IR on 2/21.  Creatinine has now returned to her baseline  UTI  -GBS UTI from 2/12 appears to have been treated with ceftriaxone.  2/27 yeast UTI, will start fluconazole, plan for total of 10 days.  Afebrile, normal WBC count.  Follow.  Follow repeat urine cx as noted above  Grade 2 diastolic heart failure-chronic -2D echoperformedduring this admission reveals grade 2 diastolic dysfunction -following fluid status closely  Gluteal skin breakdown -[place] and barrier cream -will switch to an air mattress to prevent further skin  breakdown -turn as able   Diabetes mellitus 2 -Continue on insulin sliding scale every 4  hours while she is on tube feeds -basal insulin, bolus insulin - adjust prn  Hypothyroidism -Continue Synthroid per tube  Hyperkalemia -K remains normal this morning  Scheduled Meds: . amLODipine  5 mg Per Tube Daily  . chlorhexidine  15 mL Mouth Rinse BID  . Chlorhexidine Gluconate Cloth  6 each Topical Daily  . clopidogrel  75 mg Per Tube Q breakfast  . feeding supplement (PROSource TF)  45 mL Per Tube TID  . fluconazole  100 mg Per Tube Q24H  . free water  200 mL Per Tube Q6H  . heparin  5,000 Units Subcutaneous Q8H  . insulin aspart  0-9 Units Subcutaneous Q4H  . insulin aspart  2 Units Subcutaneous Q4H  . insulin detemir  5 Units Subcutaneous Daily  . levothyroxine  112 mcg Per Tube QAC breakfast  . liver oil-zinc oxide   Topical BID  . mouth rinse  15 mL Mouth Rinse q12n4p  . modafinil  200 mg Per Tube Daily  . rosuvastatin  10 mg Per Tube Daily  . sodium chloride flush  3 mL Intravenous Once  . sodium chloride flush  5 mL Intracatheter Q8H   Continuous Infusions: . sodium chloride 10 mL/hr at 03/24/20 0305  . feeding supplement (OSMOLITE 1.2 CAL) 1,000 mL (04/05/20 1454)   PRN Meds:.acetaminophen, docusate, hydroxypropyl methylcellulose / hypromellose, iohexol, polyethylene glycol  Diet Orders (From admission, onward)    Start     Ordered   03/27/20 0001  Diet NPO time specified  Diet effective midnight       Comments: Stop tube feeds at midnight   03/26/20 1237          DVT prophylaxis: heparin injection 5,000 Units Start: 03/28/20 0600 SCDs Start: 03/18/20 1733     Code Status: DNR  Family Communication: No family at bedside  Status is: Inpatient  Remains inpatient appropriate because:Inpatient level of care appropriate due to severity of illness   Dispo: The patient is from: Home              Anticipated d/c is to: Home              Patient currently is not medically stable to d/c.   Difficult to place patient No  Level of care:  Progressive  Consultants:  Neurology  Palliative care Nephrology Urology IR  Procedures:  Right percutaneous nephrostomy 2/21  Microbiology  Urine cultures - yeast   Antimicrobials: Fluconazole 2/27   Objective: Vitals:   04/05/20 2003 04/05/20 2335 04/06/20 0343 04/06/20 0833  BP: 130/67 130/65 136/69 (!) 152/81  Pulse: 76 69 81 88  Resp: Temp: 98 F (36.7 C) 99.3 F (37.4 C) 99.4 F (37.4 C) 98.9 F (37.2 C)  TempSrc: Oral Oral Oral Oral  SpO2: 99% 100% 97% 100%  Weight:      Height:        Intake/Output Summary (Last 24 hours) at 04/06/2020 1118 Last data filed at 04/06/2020 0900 Gross per 24 hour  Intake 5 ml  Output 1150 ml  Net -1145 ml   Filed Weights   03/30/20 0309 04/04/20 0438 04/05/20 0500  Weight: 78.9 kg 81.8 kg 79.5 kg    Examination:  Constitutional: Remains poorly responsive Eyes: No scleral icterus ENMT: mmm Neck: normal, supple Respiratory: Clear bilaterally without wheezing or crackles, normal respiratory effort Cardiovascular: Regular rate and rhythm,  no murmurs, no peripheral edema Abdomen: Soft, NT, ND, bowel sounds positive Musculoskeletal: no clubbing / cyanosis.  Skin: No rashes appreciated Neurologic: Poorly responsive  Data Reviewed: I have independently reviewed following labs and imaging studies   CBC: Recent Labs  Lab 04/01/20 0257 04/02/20 0246 04/03/20 0135 04/04/20 0351 04/05/20 0322 04/06/20 0336  WBC 8.2 6.8 6.5 6.4 5.5 5.6  NEUTROABS 6.0 4.6 4.3 4.2 3.3  --   HGB 12.1 11.4* 12.2 11.7* 11.3* 11.2*  HCT 36.4 35.1* 37.6 36.8 35.0* 34.3*  MCV 98.6 98.3 99.2 99.5 99.7 99.1  PLT 315 293 290 322 PLATELET CLUMPS NOTED ON SMEAR, UNABLE TO ESTIMATE 317   Basic Metabolic Panel: Recent Labs  Lab 04/01/20 0257 04/02/20 0246 04/03/20 0135 04/04/20 0351 04/05/20 0322 04/06/20 0336  NA 138 138 136 137 139 139  K 5.2* 4.7 5.1 4.9 4.7 4.4  CL 100 97* 98 98 101 101  CO2 26 30 27 28 27 27   GLUCOSE  206* 224* 267* 199* 126* 104*  BUN 34* 37* 45* 45* 42* 40*  CREATININE 1.04* 1.07* 1.13* 1.15* 1.12* 1.08*  CALCIUM 9.6 9.5 9.6 9.7 9.8 9.8  MG 1.7 1.8 2.0 2.1 2.0  --   PHOS 4.4 4.2 4.9* 4.5 4.0  --    Liver Function Tests: Recent Labs  Lab 04/02/20 0246 04/03/20 0135 04/04/20 0351 04/05/20 0322 04/06/20 0336  AST 17 19 19 19 22   ALT 13 13 14 12 15   ALKPHOS 80 86 87 85 85  BILITOT 0.4 0.6 0.6 0.8 0.5  PROT 6.4* 6.5 6.5 6.3* 6.2*  ALBUMIN 2.6* 2.7* 2.6* 2.6* 2.5*   Coagulation Profile: No results for input(s): INR, PROTIME in the last 168 hours. HbA1C: No results for input(s): HGBA1C in the last 72 hours. CBG: Recent Labs  Lab 04/05/20 1600 04/05/20 2004 04/05/20 2336 04/06/20 0343 04/06/20 0831  GLUCAP 84 119* 168* 112* 202*    Recent Results (from the past 240 hour(s))  Culture, blood (routine x 2)     Status: None (Preliminary result)   Collection Time: 04/03/20  7:55 AM   Specimen: BLOOD RIGHT HAND  Result Value Ref Range Status   Specimen Description BLOOD RIGHT HAND  Final   Special Requests   Final    BOTTLES DRAWN AEROBIC ONLY Blood Culture results may not be optimal due to an inadequate volume of blood received in culture bottles   Culture   Final    NO GROWTH 2 DAYS Performed at [facility], [address] [place], [place] [address]    Report Status PENDING  Incomplete  Culture, blood (routine x 2)     Status: Abnormal (Preliminary result)   Collection Time: 04/03/20  8:11 AM   Specimen: BLOOD LEFT ARM  Result Value Ref Range Status   Specimen Description BLOOD LEFT ARM  Final   Special Requests   Final    BOTTLES DRAWN AEROBIC AND ANAEROBIC Blood Culture adequate volume   Culture  Setup Time   Final    GRAM POSITIVE COCCI Organism ID to follow CRITICAL RESULT CALLED TO, READ BACK BY AND VERIFIED WITH: J LEDFORD PHARMD 04/04/20 0554 JDW IN BOTH AEROBIC AND ANAEROBIC BOTTLES    Culture (A)  Final    STAPHYLOCOCCUS EPIDERMIDIS THE  SIGNIFICANCE OF ISOLATING THIS ORGANISM FROM A SINGLE SET OF BLOOD CULTURES WHEN MULTIPLE SETS ARE DRAWN IS UNCERTAIN. PLEASE NOTIFY THE MICROBIOLOGY DEPARTMENT WITHIN ONE WEEK IF SPECIATION AND SENSITIVITIES ARE REQUIRED. Performed at [facility], [address] [place], [place] [address]    Report Status PENDING  Incomplete  Blood Culture ID Panel (Reflexed)     Status: Abnormal   Collection Time: 04/03/20  8:11 AM  Result Value Ref Range Status   Enterococcus faecalis NOT DETECTED NOT DETECTED Final   Enterococcus Faecium NOT DETECTED NOT DETECTED Final   Listeria monocytogenes NOT DETECTED NOT DETECTED Final   Staphylococcus species DETECTED (A) NOT DETECTED Final    Comment: CRITICAL RESULT CALLED TO, READ BACK BY AND VERIFIED WITH: J LEDFORD PHARMD 04/04/20 0554 JDW    Staphylococcus aureus (BCID) NOT DETECTED NOT DETECTED Final   Staphylococcus epidermidis DETECTED (A) NOT DETECTED Final    Comment: Methicillin (oxacillin) resistant coagulase negative staphylococcus. Possible blood culture contaminant (unless isolated from more than one blood culture draw or clinical case suggests pathogenicity). No antibiotic treatment is indicated for blood  culture contaminants. CRITICAL RESULT CALLED TO, READ BACK BY AND VERIFIED WITH: J LEDFORD [facility] 04/04/20 0554 JDW    Staphylococcus lugdunensis NOT DETECTED NOT DETECTED Final   Streptococcus species NOT DETECTED NOT DETECTED Final   Streptococcus agalactiae NOT DETECTED NOT DETECTED Final   Streptococcus pneumoniae NOT DETECTED NOT DETECTED Final   Streptococcus pyogenes NOT DETECTED NOT DETECTED Final   A.calcoaceticus-baumannii NOT DETECTED NOT DETECTED Final   Bacteroides fragilis NOT DETECTED NOT DETECTED Final   Enterobacterales NOT DETECTED NOT DETECTED Final   Enterobacter cloacae complex NOT DETECTED NOT DETECTED Final   Escherichia coli NOT DETECTED NOT DETECTED Final   Klebsiella aerogenes NOT DETECTED NOT DETECTED Final    Klebsiella oxytoca NOT DETECTED NOT DETECTED Final   Klebsiella pneumoniae NOT DETECTED NOT DETECTED Final   Proteus species NOT DETECTED NOT DETECTED Final   Salmonella species NOT DETECTED NOT DETECTED Final   Serratia marcescens NOT DETECTED NOT DETECTED Final   Haemophilus influenzae NOT DETECTED NOT DETECTED Final   Neisseria meningitidis NOT DETECTED NOT DETECTED Final   Pseudomonas aeruginosa NOT DETECTED NOT DETECTED Final   Stenotrophomonas maltophilia NOT DETECTED NOT DETECTED Final   Candida albicans NOT DETECTED NOT DETECTED Final   Candida auris NOT DETECTED NOT DETECTED Final   Candida glabrata NOT DETECTED NOT DETECTED Final   Candida krusei NOT DETECTED NOT DETECTED Final   Candida parapsilosis NOT DETECTED NOT DETECTED Final   Candida tropicalis NOT DETECTED NOT DETECTED Final   Cryptococcus neoformans/gattii NOT DETECTED NOT DETECTED Final   Methicillin resistance mecA/C DETECTED (A) NOT DETECTED Final    Comment: CRITICAL RESULT CALLED TO, READ BACK BY AND VERIFIED WITHShela Commons [facility] 04/04/20 0554 JDW Performed at [facility], [address] [place], [place] [address]      Radiology Studies: DG Abd Portable 1V  Result Date: 04/05/2020 CLINICAL DATA:  Feeding tube placement EXAM: PORTABLE ABDOMEN - 1 VIEW COMPARISON:  Plain film from earlier same day. FINDINGS: Weighted tip feeding tube in place with tip now directed towards the expected region of the stomach pylorus/duodenal bulb. No dilated bowel loops are appreciated. IMPRESSION: Weighted tip feeding tube in place, repositioned compared to today's earlier exam, with tip now directed towards the expected region of the stomach pylorus/duodenal bulb. Electronically Signed   By: Bary Richard M.D.   On: 04/05/2020 16:56   DG Abd Portable 1V  Result Date: 04/05/2020 CLINICAL DATA:  Feeding tube placement. EXAM: PORTABLE ABDOMEN - 1 VIEW COMPARISON:  Single-view of the abdomen earlier today. FINDINGS: Feeding  tube seen on the prior  study has been advanced. The tube is now looped with the tip projecting retrograde. There is a kink at the site of the loop. No other change. IMPRESSION: As above. Electronically Signed   By: Drusilla Kanner M.D.   On: 04/05/2020 13:46    Pamella Pert, MD, PhD [facility]  Between 7 am - 7 pm I am available, please contact me via Amion or Securechat  Between 7 pm - 7 am I am not available, please contact night coverage MD/APP via Amion

## 2020-04-07 DIAGNOSIS — G9340 Encephalopathy, unspecified: Secondary | ICD-10-CM | POA: Diagnosis not present

## 2020-04-07 DIAGNOSIS — Z515 Encounter for palliative care: Secondary | ICD-10-CM | POA: Diagnosis not present

## 2020-04-07 DIAGNOSIS — Z7189 Other specified counseling: Secondary | ICD-10-CM | POA: Diagnosis not present

## 2020-04-07 DIAGNOSIS — Z66 Do not resuscitate: Secondary | ICD-10-CM | POA: Diagnosis not present

## 2020-04-07 DIAGNOSIS — N179 Acute kidney failure, unspecified: Secondary | ICD-10-CM | POA: Diagnosis not present

## 2020-04-07 LAB — GLUCOSE, CAPILLARY
Glucose-Capillary: 184 mg/dL — ABNORMAL HIGH (ref 70–99)
Glucose-Capillary: 189 mg/dL — ABNORMAL HIGH (ref 70–99)
Glucose-Capillary: 209 mg/dL — ABNORMAL HIGH (ref 70–99)
Glucose-Capillary: 209 mg/dL — ABNORMAL HIGH (ref 70–99)
Glucose-Capillary: 210 mg/dL — ABNORMAL HIGH (ref 70–99)
Glucose-Capillary: 225 mg/dL — ABNORMAL HIGH (ref 70–99)

## 2020-04-07 LAB — CULTURE, BLOOD (ROUTINE X 2): Special Requests: ADEQUATE

## 2020-04-07 LAB — URINE CULTURE: Culture: NO GROWTH

## 2020-04-07 MED ORDER — FLUCONAZOLE 10 MG/ML PO SUSR
100.0000 mg | Freq: Every evening | ORAL | Status: AC
  Administered 2020-04-07 – 2020-04-08 (×2): 100 mg
  Filled 2020-04-07 (×2): qty 10

## 2020-04-07 NOTE — Progress Notes (Signed)
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  Palliative care was consulted to aid in goals of care conversations.  Today's Discussion (04/07/2020):  *Please note that this is a verbal dictation therefore any spelling or grammatical errors are due to the "[place] [employer]" system interpretation.  Chart reviewed.   I met with patients husband, [name] at bedside. He was very sad this afternoon when I went to see Jhordyn. He expresses that things are similar to how they have been previously where Laprecious seems to have more lucid days and more confused/smonolent ones. We discussed that this is an incredibly difficult situation to be in. We further went on to talk about the reality that we have to decide which way to go whether that be PEG placement or comfort focused care. He expresses that he does not know yet and wants this weekend to speak to his children. We reviewed that if a PEG is placed Merle will need to transition to a rehabilitation facility. He requests that she go to [facility]   We further discussed taking khilee hendricksen of life into consideration this weekend. We discussed whether or not she would wish to live in a bed without tremendous interaction. We reviewed that if she does receive a PEG and neglects to improve in the oncoming months that he should think more strongly about hospice care.  [name] and I agreed to meet on Monday again to determine he and his families decisions.   Objective Assessment: Vital Signs Vitals:   04/07/20 0820 04/07/20 1209   BP: (!) 144/88 (!) 152/74  Pulse: 92 85  Resp: 18 17  Temp: 98.4 F (36.9 C) 99 F (37.2 C)  SpO2: 100% 100%    Intake/Output Summary (Last 24 hours) at 04/07/2020 1507 Last data filed at 04/06/2020 2254 Gross per 24 hour  Intake 5 ml  Output 300 ml  Net -295 ml   Last Weight  Most recent update: 04/05/2020  6:00 AM   Weight  79.5 kg (175 lb 4.3 oz)           Gen:  Elderly F in NAD HEENT: Coretrack in place, dry mucous membranes CV: Irregular rate and rhythm  PULM: 2LPM [place] ABD: soft/nontender  EXT: No edema  Neuro:  Somnolent this afternoon  SUMMARY OF RECOMMENDATIONS DNAR/DNI  Continue present level of care at this time  Ongoing conversations --> [name] states that he will have a decision by Monday in terms of placing a PEG or not  Ongoing PMT support I will not be back on service until Friday but if additional aid is needed please call our team phone number at [phone]  Time Spent:  25 minutes Greater than 50% of the time was spent in counseling and coordination of care ______________________________________________________________________________________ [facility] Team Team Cell Phone: [phone] Please utilize secure chat with additional questions, if there is no response within 30 minutes please call the above phone number  Palliative Medicine Team providers are available by phone from 7am to 7pm daily and can be reached through the team cell phone.  Should this patient require assistance  outside of these hours, please call the patient's attending physician.

## 2020-04-07 NOTE — Consult Note (Signed)
   [facility] Inpatient Consult   04/07/2020  Hannah Fischer [birth year] [id number]  [facility] [[facility]]  Accountable Care Organization [ACO] Patient: [employer] Medicare  LOS: 20 days  Patient screened for hospitalization with noted high risk score for unplanned readmission risk and to assess for potential [facility]  [[facility]] Care Management service needs for post hospital transition.  Review of patient's medical record reveals patient is being recommended for a skilled nursing facility level of care and reviewed for inpatient TOC notes, however found palliative care notes for progress and needs.  Plan:  Continue to follow progress and disposition to assess for post hospital care management needs.    For questions contact:   Charlesetta Shanks, RN BSN CCM [facility]  [phone] business mobile phone Toll free office [phone]  Fax number: [phone] [email or web address]

## 2020-04-07 NOTE — Progress Notes (Signed)
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 She had gone to bedin the morning her husband had trouble waking her up.  In the ED she was noted to be responsive to pain. A code stroke was called. She was outside the window for TPA. An emergent CT did not show any acute abnormality. A CTA was performed and did not reveal any obvious stenosis.  An MRI of the brain was performed on 2/13:Punctate acute infarction in the left superior cerebellumadjacent to the superior cerebellar peduncle. Bilateral thalamic acute infarction extending back into the mid brain. This is consistent with artery of Percheron infarction.  She continues to wax and wane from a mental status standpoint, 3/1 being minimally interactive/responsive.  She has cortrak.  Palliative and neuro are following, may need peg if no improvement towards end of week.  Subjective / 24h Interval events: Poorly responsive  Assessment & Plan: Principal Problem Bilateral Thalamic Infarcts Acute metabolic encephalopathy -due to CVA, mental status waxing and waning, at times she is minimally responsive versus talking some.  Neurology following, she has been placed on Provigil, dose increased initially but then had to be decreased due to increased tremulousness.  She was given a trial of amantadine but it was discontinued.  She has a prior history of embolic infarcts.  Imaging on admission showed punctate acute infarction in the left superior cerebellum adjacent to the superior cerebellar peduncle.  Bilateral thalamic acute infarction extending back into the midbrain, consistent with artery of Percheron infarction.  She  remains intermittently encephalopathic.  She had an event with asymmetric pupils on 2/25, had a repeat MRI without any new acute findings and expected evolution of known infarcts.  EEG 2/13 with moderate diffuse encephalopathy -Currently poorly responsive, has a core track for feeding, will discuss with husband today whether they wish to proceed with PEG tube  Active Problems Dysphagia-due to stroke, poorly responsive, on core track.  Fever -Fever on 2/27, CXR with bibasilar opacities, urine culture showed yeast and she was started on fluconazole. Blood cultures 1/2 with staph epidermidis (likely contaminated) -Afebrile  Intermittent agitation -waking up from her brain injury and agitated -will liberalize visitation so family can help sit with her  CKD 3A- AKI due to dehydration  Right Sided Hydronephrosis  -Baseline creatinine appears to be 1.0-1.3 -started free water per tube and watching I & O closely to prevent heart failure exacerbation -BUN & Cr continued to rise despite adequate hydration, 2/19>-Cr rose to 2.15- Ordered arenal ultrasound whichshowed right sided hydronephrosis (no mass or definite calcification identified).  Nephrology, urology consulted and she is status post right nephrostomy placement by IR on 2/21.  Creatinine has now returned to her baseline  UTI  -GBS UTI from 2/12 appears to have been treated with ceftriaxone.  2/27 yeast UTI, will start fluconazole, plan for total of 10 days.  Afebrile, normal WBC count.    Grade 2 diastolic heart failure-chronic -2D echoperformedduring this admission reveals grade 2 diastolic dysfunction -following fluid status closely  Gluteal skin breakdown -[place] and barrier cream -will switch to an air mattress to prevent further skin breakdown -turn as able   Diabetes mellitus 2 -Continue on insulin sliding scale every 4 hours while  she is on tube feeds -basal insulin, bolus insulin - adjust  prn  Hypothyroidism -Continue Synthroid per tube  Hyperkalemia -Monitor potassium periodically  Scheduled Meds: . amLODipine  5 mg Per Tube Daily  . chlorhexidine  15 mL Mouth Rinse BID  . Chlorhexidine Gluconate Cloth  6 each Topical Daily  . clopidogrel  75 mg Per Tube Q breakfast  . feeding supplement (PROSource TF)  45 mL Per Tube TID  . fluconazole  100 mg Per Tube Q24H  . free water  200 mL Per Tube Q6H  . heparin  5,000 Units Subcutaneous Q8H  . insulin aspart  0-9 Units Subcutaneous Q4H  . insulin aspart  2 Units Subcutaneous Q4H  . insulin detemir  5 Units Subcutaneous Daily  . levothyroxine  112 mcg Per Tube QAC breakfast  . liver oil-zinc oxide   Topical BID  . mouth rinse  15 mL Mouth Rinse q12n4p  . modafinil  200 mg Per Tube Daily  . rosuvastatin  10 mg Per Tube Daily  . sodium chloride flush  3 mL Intravenous Once  . sodium chloride flush  5 mL Intracatheter Q8H   Continuous Infusions: . sodium chloride 10 mL/hr at 03/24/20 0305  . feeding supplement (OSMOLITE 1.2 CAL) 1,000 mL (04/06/20 2257)   PRN Meds:.acetaminophen, docusate, hydroxypropyl methylcellulose / hypromellose, iohexol, polyethylene glycol  Diet Orders (From admission, onward)    Start     Ordered   03/27/20 0001  Diet NPO time specified  Diet effective midnight       Comments: Stop tube feeds at midnight   03/26/20 1237          DVT prophylaxis: heparin injection 5,000 Units Start: 03/28/20 0600 SCDs Start: 03/18/20 1733     Code Status: DNR  Family Communication: No family at bedside  Status is: Inpatient  Remains inpatient appropriate because:Inpatient level of care appropriate due to severity of illness   Dispo: The patient is from: Home              Anticipated d/c is to: Home              Patient currently is not medically stable to d/c.   Difficult to place patient No  Level of care: Progressive  Consultants:  Neurology  Palliative  care Nephrology Urology IR  Procedures:  Right percutaneous nephrostomy 2/21  Microbiology  Urine cultures - yeast   Antimicrobials: Fluconazole 2/27   Objective: Vitals:   04/06/20 1931 04/06/20 2333 04/07/20 0332 04/07/20 0820  BP: (!) 164/75 (!) 158/77 (!) 142/83 (!) 144/88  Pulse: 94 (!) 101 91 92  Resp: 18 19 17 18   Temp: 99.6 F (37.6 C) 99.4 F (37.4 C) 99.8 F (37.7 C) 98.4 F (36.9 C)  TempSrc: Oral Oral Oral Axillary  SpO2: 100% 97% 96% 100%  Weight:      Height:        Intake/Output Summary (Last 24 hours) at 04/07/2020 1036 Last data filed at 04/06/2020 2254 Gross per 24 hour  Intake 5 ml  Output 300 ml  Net -295 ml   Filed Weights   03/30/20 0309 04/04/20 0438 04/05/20 0500  Weight: 78.9 kg 81.8 kg 79.5 kg    Examination:  Constitutional: Poorly responsive Eyes: No icterus ENMT: mmm Neck: normal, supple Respiratory: Clear bilaterally, no wheezing, no crackles, normal respiratory effort Cardiovascular: Regular rate and rhythm, no murmurs, no edema Abdomen: Soft, nontender, nondistended, bowel sounds positive Musculoskeletal: no clubbing / cyanosis.  Skin:  No rashes seen Neurologic: Obtunded  Data Reviewed: I have independently reviewed following labs and imaging studies   CBC: Recent Labs  Lab 04/01/20 0257 04/02/20 0246 04/03/20 0135 04/04/20 0351 04/05/20 0322 04/06/20 0336  WBC 8.2 6.8 6.5 6.4 5.5 5.6  NEUTROABS 6.0 4.6 4.3 4.2 3.3  --   HGB 12.1 11.4* 12.2 11.7* 11.3* 11.2*  HCT 36.4 35.1* 37.6 36.8 35.0* 34.3*  MCV 98.6 98.3 99.2 99.5 99.7 99.1  PLT 315 293 290 322 PLATELET CLUMPS NOTED ON SMEAR, UNABLE TO ESTIMATE 317   Basic Metabolic Panel: Recent Labs  Lab 04/01/20 0257 04/02/20 0246 04/03/20 0135 04/04/20 0351 04/05/20 0322 04/06/20 0336  NA 138 138 136 137 139 139  K 5.2* 4.7 5.1 4.9 4.7 4.4  CL 100 97* 98 98 101 101  CO2 26 30 27 28 27 27   GLUCOSE 206* 224* 267* 199* 126* 104*  BUN 34* 37* 45* 45* 42* 40*   CREATININE 1.04* 1.07* 1.13* 1.15* 1.12* 1.08*  CALCIUM 9.6 9.5 9.6 9.7 9.8 9.8  MG 1.7 1.8 2.0 2.1 2.0  --   PHOS 4.4 4.2 4.9* 4.5 4.0  --    Liver Function Tests: Recent Labs  Lab 04/02/20 0246 04/03/20 0135 04/04/20 0351 04/05/20 0322 04/06/20 0336  AST 17 19 19 19 22   ALT 13 13 14 12 15   ALKPHOS 80 86 87 85 85  BILITOT 0.4 0.6 0.6 0.8 0.5  PROT 6.4* 6.5 6.5 6.3* 6.2*  ALBUMIN 2.6* 2.7* 2.6* 2.6* 2.5*   Coagulation Profile: No results for input(s): INR, PROTIME in the last 168 hours. HbA1C: No results for input(s): HGBA1C in the last 72 hours. CBG: Recent Labs  Lab 04/06/20 1623 04/06/20 1930 04/06/20 2323 04/07/20 0330 04/07/20 0801  GLUCAP 188* 182* 237* 189* 225*    Recent Results (from the past 240 hour(s))  Culture, blood (routine x 2)     Status: None (Preliminary result)   Collection Time: 04/03/20  7:55 AM   Specimen: BLOOD RIGHT HAND  Result Value Ref Range Status   Specimen Description BLOOD RIGHT HAND  Final   Special Requests   Final    BOTTLES DRAWN AEROBIC ONLY Blood Culture results may not be optimal due to an inadequate volume of blood received in culture bottles   Culture   Final    NO GROWTH 4 DAYS Performed at [facility], [address] [place], [place] [address]    Report Status PENDING  Incomplete  Culture, blood (routine x 2)     Status: Abnormal   Collection Time: 04/03/20  8:11 AM   Specimen: BLOOD LEFT ARM  Result Value Ref Range Status   Specimen Description BLOOD LEFT ARM  Final   Special Requests   Final    BOTTLES DRAWN AEROBIC AND ANAEROBIC Blood Culture adequate volume   Culture  Setup Time   Final    GRAM POSITIVE COCCI Organism ID to follow CRITICAL RESULT CALLED TO, READ BACK BY AND VERIFIED WITH: J LEDFORD PHARMD 04/04/20 0554 JDW IN BOTH AEROBIC AND ANAEROBIC BOTTLES    Culture (A)  Final    STAPHYLOCOCCUS EPIDERMIDIS THE SIGNIFICANCE OF ISOLATING THIS ORGANISM FROM A SINGLE SET OF BLOOD CULTURES WHEN  MULTIPLE SETS ARE DRAWN IS UNCERTAIN. PLEASE NOTIFY THE MICROBIOLOGY DEPARTMENT WITHIN ONE WEEK IF SPECIATION AND SENSITIVITIES ARE REQUIRED. Performed at [facility], [address] [place], [place] [address]    Report Status 04/07/2020 FINAL  Final  Blood Culture  ID Panel (Reflexed)     Status: Abnormal   Collection Time: 04/03/20  8:11 AM  Result Value Ref Range Status   Enterococcus faecalis NOT DETECTED NOT DETECTED Final   Enterococcus Faecium NOT DETECTED NOT DETECTED Final   Listeria monocytogenes NOT DETECTED NOT DETECTED Final   Staphylococcus species DETECTED (A) NOT DETECTED Final    Comment: CRITICAL RESULT CALLED TO, READ BACK BY AND VERIFIED WITH: J LEDFORD PHARMD 04/04/20 0554 JDW    Staphylococcus aureus (BCID) NOT DETECTED NOT DETECTED Final   Staphylococcus epidermidis DETECTED (A) NOT DETECTED Final    Comment: Methicillin (oxacillin) resistant coagulase negative staphylococcus. Possible blood culture contaminant (unless isolated from more than one blood culture draw or clinical case suggests pathogenicity). No antibiotic treatment is indicated for blood  culture contaminants. CRITICAL RESULT CALLED TO, READ BACK BY AND VERIFIED WITH: J LEDFORD [facility] 04/04/20 0554 JDW    Staphylococcus lugdunensis NOT DETECTED NOT DETECTED Final   Streptococcus species NOT DETECTED NOT DETECTED Final   Streptococcus agalactiae NOT DETECTED NOT DETECTED Final   Streptococcus pneumoniae NOT DETECTED NOT DETECTED Final   Streptococcus pyogenes NOT DETECTED NOT DETECTED Final   A.calcoaceticus-baumannii NOT DETECTED NOT DETECTED Final   Bacteroides fragilis NOT DETECTED NOT DETECTED Final   Enterobacterales NOT DETECTED NOT DETECTED Final   Enterobacter cloacae complex NOT DETECTED NOT DETECTED Final   Escherichia coli NOT DETECTED NOT DETECTED Final   Klebsiella aerogenes NOT DETECTED NOT DETECTED Final   Klebsiella oxytoca NOT DETECTED NOT DETECTED Final   Klebsiella pneumoniae  NOT DETECTED NOT DETECTED Final   Proteus species NOT DETECTED NOT DETECTED Final   Salmonella species NOT DETECTED NOT DETECTED Final   Serratia marcescens NOT DETECTED NOT DETECTED Final   Haemophilus influenzae NOT DETECTED NOT DETECTED Final   Neisseria meningitidis NOT DETECTED NOT DETECTED Final   Pseudomonas aeruginosa NOT DETECTED NOT DETECTED Final   Stenotrophomonas maltophilia NOT DETECTED NOT DETECTED Final   Candida albicans NOT DETECTED NOT DETECTED Final   Candida auris NOT DETECTED NOT DETECTED Final   Candida glabrata NOT DETECTED NOT DETECTED Final   Candida krusei NOT DETECTED NOT DETECTED Final   Candida parapsilosis NOT DETECTED NOT DETECTED Final   Candida tropicalis NOT DETECTED NOT DETECTED Final   Cryptococcus neoformans/gattii NOT DETECTED NOT DETECTED Final   Methicillin resistance mecA/C DETECTED (A) NOT DETECTED Final    Comment: CRITICAL RESULT CALLED TO, READ BACK BY AND VERIFIED WITHShela Commons [facility] 04/04/20 0554 JDW Performed at [facility], [address], [place], [place] [address]      Radiology Studies: No results found.  Pamella Pert, MD, PhD [facility]  Between 7 am - 7 pm I am available, please contact me via Amion or Securechat  Between 7 pm - 7 am I am not available, please contact night coverage MD/APP via Amion

## 2020-04-07 NOTE — Progress Notes (Signed)
Granddaughter at the bedside. She is not aware of any decision regarding feeding tube by her grandfather.  Patient's neurologic status is unchanged.  She continues to be drowsy and keeps eyes closed. Nonverbal today.  She follows occasional midline commands only.  Vital signs stable.  Neuro exam unchanged.  Vitals:   04/06/20 2333 04/07/20 0332 04/07/20 0820 04/07/20 1209  BP: (!) 158/77 (!) 142/83 (!) 144/88 (!) 152/74  Pulse: (!) 101 91 92 85  Resp: 19 17 18 17   Temp: 99.4 F (37.4 C) 99.8 F (37.7 C) 98.4 F (36.9 C) 99 F (37.2 C)  TempSrc: Oral Oral Axillary Axillary  SpO2: 97% 96% 100% 100%  Weight:      Height:       CBC:  Recent Labs  Lab 04/04/20 0351 04/05/20 0322 04/06/20 0336  WBC 6.4 5.5 5.6  NEUTROABS 4.2 3.3  --   HGB 11.7* 11.3* 11.2*  HCT 36.8 35.0* 34.3*  MCV 99.5 99.7 99.1  PLT 322 PLATELET CLUMPS NOTED ON SMEAR, UNABLE TO ESTIMATE 317   Basic Metabolic Panel:  Recent Labs  Lab 04/04/20 0351 04/05/20 0322 04/06/20 0336  NA 137 139 139  K 4.9 4.7 4.4  CL 98 101 101  CO2 28 27 27   GLUCOSE 199* 126* 104*  BUN 45* 42* 40*  CREATININE 1.15* 1.12* 1.08*  CALCIUM 9.7 9.8 9.8  MG 2.1 2.0  --   PHOS 4.5 4.0  --    Lipid Panel:  No results for input(s): CHOL, TRIG, HDL, CHOLHDL, VLDL, LDLCALC in the last 168 hours. HgbA1c:  No results for input(s): HGBA1C in the last 168 hours. Urine Drug Screen:  No results for input(s): LABOPIA, COCAINSCRNUR, LABBENZ, AMPHETMU, THCU, LABBARB in the last 168 hours.  Alcohol Level No results for input(s): ETH in the last 168 hours.  IMAGING past 24 hours No results found. PHYSICAL EXAM Physical Exam:     Frail elderly female lying in bed with eyes closed. No verbalization with voice are and tactile stimulation.  Head is nontraumatic.  Resp: Without extra work of breathing  Neurological Exam  Patient is sleepy but can be aroused and opens eyes but will not keep them open for long.  Speech is at times difficult  to understand and slurred occasional words and short sentences can be heard.  She follows simple midline few one-step commands Face appears symmetric.    Tongue midline She moves right upper and both lower extremities purposefully but does not follow commands for formal testing.  Moves left upper extremity less than the right lung.  Gait not tested   ASSESSMENT/PLAN Ms. ETHAN KASPERSKI is a 87 y.o. female with cerebral amyloid angiopathy, brain aneurysm, embolic stroke 2021, DM2, PVD, PE, HTN obesity who presented after sudden onset of unresponsiveness and altered mental status with possibly some increased left arm weakness of unclear etiology.    Bilateral medial thalamic and punctate left superior cerebellar infarcts possibly from embolism to top of the basilar though involvement of artery of Percheron which may supply both medial thalamus due to congenital variant and technically is a small vessel disease could explain this presentation.  Tiny left cerebellar punctate infarct may also be a concurrent lacune.  However she has had previous history of embolic as well as small vessel infarcts in October 21 hence strong suspicion for underlying paroxysmal A. Fib.   Studies: CT head no acute abnormalities MRI bilateral medial thalamic punctate left superior cerebellar infarct CTA suboptimal due to contrast  timing but no LVO.  Diminutive flow in the right vertebral artery MR Brain 2/25 Mildly restricted diffusion within the bilateral thalami consistent with expected evolution of known infarcts. ECHO on 11/24/2019 ejection fraction 60-65%.  No wall motion abnormalities. LDL 155 mg percent HbA1c 7.6 EEG showed no seizures   LDL 155  HgbA1c 7.6  VTE prophylaxis is recommended    Diet   Diet NPO time specified   Dysphagia with cortrak tube placed for feeding and medications  On plavix prior to admission.   Goals of care discussion underway with husband/family as above. Palliative care  following.    Hypertension  Hypertensive to 190 systolic this morning and consistently for past 2 days  . Permissive hypertension (OK if < 220/120) but gradually normalize in 5-7 days . Long-term BP goal normotensive  Hyperlipidemia  Home meds:  Crestor 5mg    LDL 155, goal < 70  Crestor increased to 10mg  upon admission, will hold in setting of AKI per discussion with pharmacist. Resume when appropriate.   Continue statin at discharge  Diabetes type II Uncontrolled  HgbA1c 7.6, goal < 7.0  CBGs Recent Labs    04/07/20 0330 04/07/20 0801 04/07/20 1156  GLUCAP 189* 225* 210*      SSI   Somnolence/Encephalopathy  Multifactorial. Could be related to thalamic infarcts. In light of worsening renal function and heart failure precaution will reduce Amantadine to 50mg  daily.   Ritalin dced d/t agitation a week ago  If safe from renal standpoint, agitation and blood pressure management concerns consider adding modafinil 100mg  BID    Other Stroke Risk Factors  Advanced Age >/= 37   Hx stroke/TIA  Grade 2 diastolic dysfunction   Other Active Problems  AKI: Baseline Ctn 1.0-1.3  Renal ultrasound reveals moderate to severe Hydronephrosis on the right with possible UP junction stone- Urology placed right nephrostomy tube 2/21  Acute metabolic encephalopathy - persistent  Grade 2 diastolic heart failure-chronic -2D echo during this admission reveals grade 2 diastolic dysfunction Hospital day # 20  Patient's mental status and wakefulness remain quite poor and she is showing minimal response to Provigil and was not able to tolerate a higher dose..  Long discussion with  granddaughter at the bedside and with Dr. and answered questions.  I recommend r PEG tube if family wants to pursue aggressive care since mental status and swallowing is not improving quickly enough..  Greater than 50% time during this 25 minute  visit was spent in counseling and coordination  of care and discussion with patient, husband and Dr.Gherghe answering questions.  Stroke team will sign off.  Kindly call for questions    , MD To contact Stroke Continuity provider, please refer to 76. After hours, contact General Neurology   Hospital day # 20   04/07/2020 1:18 PM

## 2020-04-07 NOTE — Progress Notes (Signed)
Physical Therapy Treatment Patient Details Name: Hannah Fischer MRN: [id number] DOB: 1932-11-28 Today's Date: 04/07/2020    History of Present Illness 87 y.o. female presenting code stroke. Imaging (+) for bilateral medial thalamic and punctate infarcts and L superior cerebellar infarcts. PMHx significant for multiple CVAs with most recent acute/subacute R frontal lobe and L parietal and occipital lobe infarcts ~3 months ago, residual L-sided hemiplegia with recent d/c from outpatient PT/OT with return to baseline level of function, TIA, DM, PE, anxiety, HTN, and HLD.    PT Comments    Patient with decreased level of arousal. Patient able to open eyes for ~30 seconds after cleaning around eyes. Patient totalA+2 for rolling to perform pericare. Patient not following commands or tactile cueing. Patient continues to be limited by level of arousal. Continue to recommend SNF for ongoing Physical Therapy.       Follow Up Recommendations  SNF;Supervision/Assistance - 24 hour     Equipment Recommendations  Wheelchair cushion (measurements PT);Wheelchair (measurements PT);Hospital bed    Recommendations for Other Services       Precautions / Restrictions Precautions Precautions: Fall Precaution Comments: cortrak, bilateral mittens, nephrostomy drain. Restrictions Weight Bearing Restrictions: No    Mobility  Bed Mobility Overal bed mobility: Needs Assistance Bed Mobility: Rolling Rolling: Total assist;+2 for physical assistance         General bed mobility comments: totalA+2 for rolling to perform pericare. Patient did not initiate with tactile and verbal cues. Patient difficult to arouse, however at times mumbling. Cleaned around eyes and with assistance, patient able to open eyes for ~30 seconds. Deferred further bed mobility due to level of arousal    Transfers                    Ambulation/Gait                 Stairs             Wheelchair Mobility     Modified Rankin (Stroke Patients Only)       Balance                                            Cognition Arousal/Alertness: Lethargic Behavior During Therapy: Flat affect Overall Cognitive Status: Difficult to assess                                        Exercises      General Comments        Pertinent Vitals/Pain Pain Assessment: Faces Faces Pain Scale: No hurt Pain Intervention(s): Monitored during session    Home Living                      Prior Function            PT Goals (current goals can now be found in the care plan section) Acute Rehab PT Goals Patient Stated Goal: Unable to state. PT Goal Formulation: Patient unable to participate in goal setting Time For Goal Achievement: 04/17/20 Potential to Achieve Goals: Fair Progress towards PT goals: Not progressing toward goals - comment (limited by lethargy)    Frequency    Min 3X/week      PT Plan Current plan remains appropriate    Co-evaluation  AM-PAC PT "6 Clicks" Mobility   Outcome Measure  Help needed turning from your back to your side while in a flat bed without using bedrails?: Total Help needed moving from lying on your back to sitting on the side of a flat bed without using bedrails?: Total Help needed moving to and from a bed to a chair (including a wheelchair)?: Total Help needed standing up from a chair using your arms (e.g., wheelchair or bedside chair)?: Total Help needed to walk in hospital room?: Total Help needed climbing 3-5 steps with a railing? : Total 6 Click Score: 6    End of Session Equipment Utilized During Treatment: Oxygen Activity Tolerance: Patient limited by lethargy Patient left: in bed;with call bell/phone within reach;with bed alarm set;with SCD's reapplied Nurse Communication: Mobility status;Need for lift equipment PT Visit Diagnosis: Other symptoms and signs involving the nervous system  (R29.898);Difficulty in walking, not elsewhere classified (R26.2);Unsteadiness on feet (R26.81);Muscle weakness (generalized) (M62.81)     Time: [id number] PT Time Calculation (min) (ACUTE ONLY): 20 min  Charges:  $Therapeutic Activity: 8-22 mins                     Larence Thone A. Dan Humphreys PT, DPT Acute Rehabilitation Services Pager [phone] Office [phone]    Viviann Spare 04/07/2020, 2:50 PM

## 2020-04-08 DIAGNOSIS — N179 Acute kidney failure, unspecified: Secondary | ICD-10-CM | POA: Diagnosis not present

## 2020-04-08 LAB — CULTURE, BLOOD (ROUTINE X 2): Culture: NO GROWTH

## 2020-04-08 LAB — COMPREHENSIVE METABOLIC PANEL
ALT: 16 U/L (ref 0–44)
AST: 22 U/L (ref 15–41)
Albumin: 2.6 g/dL — ABNORMAL LOW (ref 3.5–5.0)
Alkaline Phosphatase: 95 U/L (ref 38–126)
Anion gap: 9 (ref 5–15)
BUN: 40 mg/dL — ABNORMAL HIGH (ref 8–23)
CO2: 30 mmol/L (ref 22–32)
Calcium: 9.9 mg/dL (ref 8.9–10.3)
Chloride: 97 mmol/L — ABNORMAL LOW (ref 98–111)
Creatinine, Ser: 0.99 mg/dL (ref 0.44–1.00)
GFR, Estimated: 56 mL/min — ABNORMAL LOW (ref >60)
Glucose, Bld: 124 mg/dL — ABNORMAL HIGH (ref 70–99)
Potassium: 4.7 mmol/L (ref 3.5–5.1)
Sodium: 136 mmol/L (ref 135–145)
Total Bilirubin: 0.5 mg/dL (ref 0.3–1.2)
Total Protein: 6.7 g/dL (ref 6.5–8.1)

## 2020-04-08 LAB — CBC
HCT: 36.7 % (ref 36.0–46.0)
Hemoglobin: 11.8 g/dL — ABNORMAL LOW (ref 12.0–15.0)
MCH: 31.8 pg (ref 26.0–34.0)
MCHC: 32.2 g/dL (ref 30.0–36.0)
MCV: 98.9 fL (ref 80.0–100.0)
Platelets: 382 10*3/uL (ref 150–400)
RBC: 3.71 MIL/uL — ABNORMAL LOW (ref 3.87–5.11)
RDW: 12.5 % (ref 11.5–15.5)
WBC: 7.4 10*3/uL (ref 4.0–10.5)
nRBC: 0 % (ref 0.0–0.2)

## 2020-04-08 LAB — GLUCOSE, CAPILLARY
Glucose-Capillary: 118 mg/dL — ABNORMAL HIGH (ref 70–99)
Glucose-Capillary: 141 mg/dL — ABNORMAL HIGH (ref 70–99)
Glucose-Capillary: 150 mg/dL — ABNORMAL HIGH (ref 70–99)
Glucose-Capillary: 152 mg/dL — ABNORMAL HIGH (ref 70–99)
Glucose-Capillary: 158 mg/dL — ABNORMAL HIGH (ref 70–99)
Glucose-Capillary: 205 mg/dL — ABNORMAL HIGH (ref 70–99)

## 2020-04-08 MED ORDER — FREE WATER
100.0000 mL | Freq: Four times a day (QID) | Status: DC
  Administered 2020-04-08 – 2020-04-13 (×22): 100 mL

## 2020-04-08 MED ORDER — GLUCERNA 1.2 CAL PO LIQD
1000.0000 mL | ORAL | Status: DC
  Administered 2020-04-09 – 2020-04-30 (×18): 1000 mL
  Filled 2020-04-08 (×30): qty 1000

## 2020-04-08 NOTE — Progress Notes (Signed)
Pt's TF stopped at 0000 04/08/2020 per diet order instructions. Dr. Arville Care reached out to for clarity about TF d/t MAR order and diet order not being clear as an intervention. D/t the confusion Dr. Arville Care suggested that the TF be held and the morning team to clarify orders. Pt's new TF order not started d/t the above concern.

## 2020-04-08 NOTE — Progress Notes (Signed)
Nutrition Follow-up  DOCUMENTATION CODES:   Not applicable  INTERVENTION:   Continue TF via Cortrak tube:  Transition to Glucerna 1.2 at 45 ml/h (1046m)  Prosource TF 45 ml TID  Free water flushes 100 ml every 6 hours   Provides 1416 kcal, 93 gm protein, 1469 ml total free water daily  Monitor for results of GOC discussion -- currently scheduled for Monday, 3/7, after pt's husband has had an opportunity to discuss things with their children. If decision is made to continue with treatment, recommend continuing with above TF regimen once PEG is placed and ready for use. If decision is made for comfort care, Cortrak should be removed and food should be a source of comfort.   NUTRITION DIAGNOSIS:   Inadequate oral intake related to inability to eat as evidenced by NPO status.  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  2/14 Cortrak placed (gastric) 2/19 ultrasound revealed hydronephrosis, R worse that L and chronic R UPJ obstruction. 2/21 R percutaneous nephrostomy drain placement in IR. 3/1 TF held due to [place] dis[place] 3/2 Cortrak repositioned; gastric tip  Patient continues to require TF via Cortrak. Current regimen: Osmolite 1.2 at 45 ml/hr with Prosource TF 45 ml TID and 2082mfree water flushes Q6H. Tolerating well.  P[place] today (PEG vs comfort). Pt's husband would like to take the weekend to discuss things with their children and intends to provide an answer on Monday, 3/7. If decision is made to continue with  treatment, above recommended TF regimen can be continued once PEG is ready for use. If decision is made to go comfort, Cortrak should be removed and food should be a source of comfort.   Code status: DNR  UOP: 75066mocumented x24 hours  Admit wt: 78.9 kg Current wt: 79.5 kg  Medications: ss novolog Q4H, 2 units novolog Q4H, 5 units levemir daily Labs reviewed.  CBGs [phone] Coordinator following)  Plan to transition to Glucerna due to consistently elevated CBGs  Diet Order:   Diet Order            Diet NPO time specified  Diet effective midnight                 EDUCATION NEEDS:   No education needs have been identified at this time  Skin:  Skin Assessment: Reviewed RN Assessment  Last BM:  3/4 type 7  Height:   Ht Readings from Last 1 Encounters:  03/18/20 5' 3"  (1.6 m)    Weight:   Wt Readings from Last 1 Encounters:  04/05/20 79.5 kg    Ideal Body Weight:  52.2 kg  BMI:  Body mass index is 31.05 kg/m.  Estimated Nutritional Needs:   Kcal:  1400-1600  Protein:  85-100 grams  Fluid:  > 1.5 L/day   AmaLarkin InaS, RD, LDN RD pager number and weekend/on-call pager number located in [facility]

## 2020-04-08 NOTE — Progress Notes (Signed)
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 She had gone to bedin the morning her husband had trouble waking her up.  In the ED she was noted to be responsive to pain. A code stroke was called. She was outside the window for TPA. An emergent CT did not show any acute abnormality. A CTA was performed and did not reveal any obvious stenosis.  An MRI of the brain was performed on 2/13:Punctate acute infarction in the left superior cerebellumadjacent to the superior cerebellar peduncle. Bilateral thalamic acute infarction extending back into the mid brain. This is consistent with artery of Percheron infarction.  She continues to wax and wane from a mental status standpoint, 3/1 being minimally interactive/responsive.  She has cortrak.  Palliative and neuro are following, may need peg if no improvement towards end of week.  Subjective / 24h Interval events: Opens her eyes when prompted, mumbled a few unintelligible words  Assessment & Plan: Principal Problem Bilateral Thalamic Infarcts Acute metabolic encephalopathy -due to CVA, mental status waxing and waning, at times she is minimally responsive versus talking some.  Neurology following, she has been placed on Provigil, dose increased initially but then had to be decreased due to increased tremulousness.  She was given a trial of amantadine but it was discontinued.  She has a prior history of embolic infarcts.  Imaging on admission showed punctate acute infarction in the left superior cerebellum adjacent to the superior cerebellar peduncle.  Bilateral thalamic acute infarction extending back into the midbrain, consistent  with artery of Percheron infarction.  She remains intermittently encephalopathic.  She had an event with asymmetric pupils on 2/25, had a repeat MRI without any new acute findings and expected evolution of known infarcts.  EEG 2/13 with moderate diffuse encephalopathy -Remains poorly responsive although intermittently more awake.  Family still thinking regarding PEG tube, will give [place] an answer on Monday  Active Problems Dysphagia-due to stroke, poorly responsive, on core track.  Fever -Fever on 2/27, CXR with bibasilar opacities, urine culture showed yeast and she was started on fluconazole. Blood cultures 1/2 with staph epidermidis (likely contaminated) -Afebrile  Intermittent agitation -waking up from her brain injury and agitated -will liberalize visitation so family can help sit with her  CKD 3A- AKI due to dehydration  Right Sided Hydronephrosis  -Baseline creatinine appears to be 1.0-1.3 -started free water per tube and watching I & O closely to prevent heart failure exacerbation -BUN & Cr continued to rise despite adequate hydration, 2/19>-Cr rose to 2.15- Ordered arenal ultrasound whichshowed right sided hydronephrosis (no mass or definite calcification identified).  Nephrology, urology consulted and she is status post right nephrostomy placement by IR on 2/21.  Creatinine has now returned to her baseline  UTI  -GBS UTI from 2/12 appears to have been treated with ceftriaxone.  2/27 yeast UTI, will start fluconazole, plan for total of 10 days.  Afebrile, normal WBC count.    Grade 2 diastolic heart failure-chronic -2D echoperformedduring this admission reveals grade 2 diastolic dysfunction -following fluid status closely  Gluteal skin breakdown -[place] and barrier cream -will switch to an air mattress to prevent further skin breakdown -turn as able   Diabetes mellitus 2 -Continue on  insulin sliding scale every 4 hours while she is on tube feeds -basal insulin, bolus  insulin - adjust prn  Hypothyroidism -Continue Synthroid per tube  Hyperkalemia -Monitor potassium periodically  Scheduled Meds: . amLODipine  5 mg Per Tube Daily  . chlorhexidine  15 mL Mouth Rinse BID  . Chlorhexidine Gluconate Cloth  6 each Topical Daily  . clopidogrel  75 mg Per Tube Q breakfast  . feeding supplement (PROSource TF)  45 mL Per Tube TID  . fluconazole  100 mg Per Tube QPM  . free water  100 mL Per Tube Q6H  . heparin  5,000 Units Subcutaneous Q8H  . insulin aspart  0-9 Units Subcutaneous Q4H  . insulin aspart  2 Units Subcutaneous Q4H  . insulin detemir  5 Units Subcutaneous Daily  . levothyroxine  112 mcg Per Tube QAC breakfast  . liver oil-zinc oxide   Topical BID  . mouth rinse  15 mL Mouth Rinse q12n4p  . modafinil  200 mg Per Tube Daily  . rosuvastatin  10 mg Per Tube Daily  . sodium chloride flush  3 mL Intravenous Once  . sodium chloride flush  5 mL Intracatheter Q8H   Continuous Infusions: . sodium chloride 10 mL/hr at 03/24/20 0305  . feeding supplement (GLUCERNA 1.2 CAL) Stopped (04/08/20 0202)   PRN Meds:.acetaminophen, docusate, hydroxypropyl methylcellulose / hypromellose, iohexol, polyethylene glycol  Diet Orders (From admission, onward)    Start     Ordered   03/27/20 0001  Diet NPO time specified  Diet effective midnight       Comments: Stop tube feeds at midnight   03/26/20 1237          DVT prophylaxis: heparin injection 5,000 Units Start: 03/28/20 0600 SCDs Start: 03/18/20 1733     Code Status: DNR  Family Communication: No family at bedside, discussed with the husband on 3/4  Status is: Inpatient  Remains inpatient appropriate because:Inpatient level of care appropriate due to severity of illness   Dispo: The patient is from: Home              Anticipated d/c is to: Home              Patient currently is not medically stable to d/c.   Difficult to place patient No  Level of care: Progressive  Consultants:   Neurology  Palliative care Nephrology Urology IR  Procedures:  Right percutaneous nephrostomy 2/21  Microbiology  Urine cultures - yeast   Antimicrobials: Fluconazole 2/27   Objective: Vitals:   04/07/20 2359 04/08/20 0408 04/08/20 0423 04/08/20 0700  BP: (!) 149/84 135/85  (!) 157/90  Pulse: 94 93  89  Resp: 18 18  18   Temp: 98.9 F (37.2 C) 99 F (37.2 C)  98.6 F (37 C)  TempSrc: Oral Oral  Axillary  SpO2: 100% 100%  100%  Weight:   80 kg   Height:        Intake/Output Summary (Last 24 hours) at 04/08/2020 0954 Last data filed at 04/08/2020 0630 Gross per 24 hour  Intake -  Output 900 ml  Net -900 ml   Filed Weights   04/04/20 0438 04/05/20 0500 04/08/20 0423  Weight: 81.8 kg 79.5 kg 80 kg    Examination:  Constitutional: Seems more alert Eyes: no icterus  ENMT: mmm Neck: normal, supple Respiratory: CTA biL, no wheezing, no crackles Cardiovascular: rrr, no mrg, no edema  Abdomen: soft, nt, nd, BS+ Musculoskeletal: no clubbing / cyanosis.  Skin: no rashes Neurologic: does not follow commands  Data Reviewed: I have independently reviewed following labs and imaging studies   CBC: Recent Labs  Lab 04/02/20 0246 04/03/20 0135 04/04/20 0351 04/05/20 0322 04/06/20 0336 04/08/20 0232  WBC 6.8 6.5 6.4 5.5 5.6 7.4  NEUTROABS 4.6 4.3 4.2 3.3  --   --   HGB 11.4* 12.2 11.7* 11.3* 11.2* 11.8*  HCT 35.1* 37.6 36.8 35.0* 34.3* 36.7  MCV 98.3 99.2 99.5 99.7 99.1 98.9  PLT 293 290 322 PLATELET CLUMPS NOTED ON SMEAR, UNABLE TO ESTIMATE 317 382   Basic Metabolic Panel: Recent Labs  Lab 04/02/20 0246 04/03/20 0135 04/04/20 0351 04/05/20 0322 04/06/20 0336 04/08/20 0232  NA 138 136 137 139 139 136  K 4.7 5.1 4.9 4.7 4.4 4.7  CL 97* 98 98 101 101 97*  CO2 30 27 28 27 27 30   GLUCOSE 224* 267* 199* 126* 104* 124*  BUN 37* 45* 45* 42* 40* 40*  CREATININE 1.07* 1.13* 1.15* 1.12* 1.08* 0.99  CALCIUM 9.5 9.6 9.7 9.8 9.8 9.9  MG 1.8 2.0 2.1 2.0  --   --    PHOS 4.2 4.9* 4.5 4.0  --   --    Liver Function Tests: Recent Labs  Lab 04/03/20 0135 04/04/20 0351 04/05/20 0322 04/06/20 0336 04/08/20 0232  AST 19 19 19 22 22   ALT 13 14 12 15 16   ALKPHOS 86 87 85 85 95  BILITOT 0.6 0.6 0.8 0.5 0.5  PROT 6.5 6.5 6.3* 6.2* 6.7  ALBUMIN 2.7* 2.6* 2.6* 2.5* 2.6*   Coagulation Profile: No results for input(s): INR, PROTIME in the last 168 hours. HbA1C: No results for input(s): HGBA1C in the last 72 hours. CBG: Recent Labs  Lab 04/07/20 1601 04/07/20 1938 04/07/20 2357 04/08/20 0407 04/08/20 0726  GLUCAP 209* 184* 209* 150* 141*    Recent Results (from the past 240 hour(s))  Culture, blood (routine x 2)     Status: None   Collection Time: 04/03/20  7:55 AM   Specimen: BLOOD RIGHT HAND  Result Value Ref Range Status   Specimen Description BLOOD RIGHT HAND  Final   Special Requests   Final    BOTTLES DRAWN AEROBIC ONLY Blood Culture results may not be optimal due to an inadequate volume of blood received in culture bottles   Culture   Final    NO GROWTH 5 DAYS Performed at [facility], [address], [place], [facility] [address]    Report Status 04/08/2020 FINAL  Final  Culture, blood (routine x 2)     Status: Abnormal   Collection Time: 04/03/20  8:11 AM   Specimen: BLOOD LEFT ARM  Result Value Ref Range Status   Specimen Description BLOOD LEFT ARM  Final   Special Requests   Final    BOTTLES DRAWN AEROBIC AND ANAEROBIC Blood Culture adequate volume   Culture  Setup Time   Final    GRAM POSITIVE COCCI Organism ID to follow CRITICAL RESULT CALLED TO, READ BACK BY AND VERIFIED WITH: J LEDFORD PHARMD 04/04/20 0554 JDW IN BOTH AEROBIC AND ANAEROBIC BOTTLES    Culture (A)  Final    STAPHYLOCOCCUS EPIDERMIDIS THE SIGNIFICANCE OF ISOLATING THIS ORGANISM FROM A SINGLE SET OF BLOOD CULTURES WHEN MULTIPLE SETS ARE DRAWN IS UNCERTAIN. PLEASE NOTIFY THE MICROBIOLOGY DEPARTMENT WITHIN ONE WEEK IF SPECIATION AND SENSITIVITIES ARE  REQUIRED. Performed at [facility], [address], [place], [facility] [address]    Report  Status 04/07/2020 FINAL  Final  Blood Culture ID Panel (Reflexed)     Status: Abnormal   Collection Time: 04/03/20  8:11 AM  Result Value Ref Range Status   Enterococcus faecalis NOT DETECTED NOT DETECTED Final   Enterococcus Faecium NOT DETECTED NOT DETECTED Final   Listeria monocytogenes NOT DETECTED NOT DETECTED Final   Staphylococcus species DETECTED (A) NOT DETECTED Final    Comment: CRITICAL RESULT CALLED TO, READ BACK BY AND VERIFIED WITH: J LEDFORD PHARMD 04/04/20 0554 JDW    Staphylococcus aureus (BCID) NOT DETECTED NOT DETECTED Final   Staphylococcus epidermidis DETECTED (A) NOT DETECTED Final    Comment: Methicillin (oxacillin) resistant coagulase negative staphylococcus. Possible blood culture contaminant (unless isolated from more than one blood culture draw or clinical case suggests pathogenicity). No antibiotic treatment is indicated for blood  culture contaminants. CRITICAL RESULT CALLED TO, READ BACK BY AND VERIFIED WITH: J LEDFORD [facility] 04/04/20 0554 JDW    Staphylococcus lugdunensis NOT DETECTED NOT DETECTED Final   Streptococcus species NOT DETECTED NOT DETECTED Final   Streptococcus agalactiae NOT DETECTED NOT DETECTED Final   Streptococcus pneumoniae NOT DETECTED NOT DETECTED Final   Streptococcus pyogenes NOT DETECTED NOT DETECTED Final   A.calcoaceticus-baumannii NOT DETECTED NOT DETECTED Final   Bacteroides fragilis NOT DETECTED NOT DETECTED Final   Enterobacterales NOT DETECTED NOT DETECTED Final   Enterobacter cloacae complex NOT DETECTED NOT DETECTED Final   Escherichia coli NOT DETECTED NOT DETECTED Final   Klebsiella aerogenes NOT DETECTED NOT DETECTED Final   Klebsiella oxytoca NOT DETECTED NOT DETECTED Final   Klebsiella pneumoniae NOT DETECTED NOT DETECTED Final   Proteus species NOT DETECTED NOT DETECTED Final   Salmonella species NOT DETECTED NOT DETECTED  Final   Serratia marcescens NOT DETECTED NOT DETECTED Final   Haemophilus influenzae NOT DETECTED NOT DETECTED Final   Neisseria meningitidis NOT DETECTED NOT DETECTED Final   Pseudomonas aeruginosa NOT DETECTED NOT DETECTED Final   Stenotrophomonas maltophilia NOT DETECTED NOT DETECTED Final   Candida albicans NOT DETECTED NOT DETECTED Final   Candida auris NOT DETECTED NOT DETECTED Final   Candida glabrata NOT DETECTED NOT DETECTED Final   Candida krusei NOT DETECTED NOT DETECTED Final   Candida parapsilosis NOT DETECTED NOT DETECTED Final   Candida tropicalis NOT DETECTED NOT DETECTED Final   Cryptococcus neoformans/gattii NOT DETECTED NOT DETECTED Final   Methicillin resistance mecA/C DETECTED (A) NOT DETECTED Final    Comment: CRITICAL RESULT CALLED TO, READ BACK BY AND VERIFIED WITHShela Commons [facility] 04/04/20 0554 JDW Performed at [facility] Lab, [address] [place], [place] [address]   Culture, Urine     Status: None   Collection Time: 04/04/20  4:32 PM   Specimen: Urine, Random  Result Value Ref Range Status   Specimen Description URINE, RANDOM  Final   Special Requests NONE  Final   Culture   Final    NO GROWTH Performed at [facility], [address], [place], [place] [address]    Report Status 04/07/2020 FINAL  Final     Radiology Studies: No results found.  Pamella Pert, MD, PhD [facility]  Between 7 am - 7 pm I am available, please contact me via Amion or Securechat  Between 7 pm - 7 am I am not available, please contact night coverage MD/APP via Amion

## 2020-04-08 NOTE — Plan of Care (Signed)
  Problem: Education: Goal: Knowledge of disease or condition will improve Outcome: Progressing Goal: Knowledge of secondary prevention will improve Outcome: Progressing Goal: Knowledge of patient specific risk factors addressed and post discharge goals established will improve Outcome: Progressing Goal: Individualized Educational Video(s) Outcome: Progressing   Problem: Coping: Goal: Will verbalize positive feelings about self Outcome: Progressing   Problem: Health Behavior/Discharge Planning: Goal: Ability to manage health-related needs will improve Outcome: Progressing   

## 2020-04-09 ENCOUNTER — Inpatient Hospital Stay (HOSPITAL_COMMUNITY): Payer: Medicare Other

## 2020-04-09 DIAGNOSIS — N179 Acute kidney failure, unspecified: Secondary | ICD-10-CM | POA: Diagnosis not present

## 2020-04-09 LAB — GLUCOSE, CAPILLARY
Glucose-Capillary: 128 mg/dL — ABNORMAL HIGH (ref 70–99)
Glucose-Capillary: 130 mg/dL — ABNORMAL HIGH (ref 70–99)
Glucose-Capillary: 155 mg/dL — ABNORMAL HIGH (ref 70–99)
Glucose-Capillary: 163 mg/dL — ABNORMAL HIGH (ref 70–99)
Glucose-Capillary: 194 mg/dL — ABNORMAL HIGH (ref 70–99)
Glucose-Capillary: 97 mg/dL (ref 70–99)

## 2020-04-09 NOTE — Progress Notes (Signed)
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 She had gone to bedin the morning her husband had trouble waking her up.  In the ED she was noted to be responsive to pain. A code stroke was called. She was outside the window for TPA. An emergent CT did not show any acute abnormality. A CTA was performed and did not reveal any obvious stenosis.  An MRI of the brain was performed on 2/13:Punctate acute infarction in the left superior cerebellumadjacent to the superior cerebellar peduncle. Bilateral thalamic acute infarction extending back into the mid brain. This is consistent with artery of Percheron infarction.  She continues to wax and wane from a mental status standpoint, 3/1 being minimally interactive/responsive.  She has cortrak.  Palliative and neuro are following, may need peg if no improvement towards end of week.  Subjective / 24h Interval events: Obtunded this morning, temp 100.2 last night. Afebrile this morning 97.8  Assessment & Plan: Principal Problem Bilateral Thalamic Infarcts Acute metabolic encephalopathy -due to CVA, mental status waxing and waning, at times she is minimally responsive versus talking some.  Neurology following, she has been placed on Provigil, dose increased initially but then had to be decreased due to increased tremulousness.  She was given a trial of amantadine but it was discontinued.  She has a prior history of embolic infarcts.  Imaging on admission showed punctate acute infarction in the left superior cerebellum adjacent to the superior cerebellar peduncle.  Bilateral thalamic acute infarction extending back into the midbrain,  consistent with artery of Percheron infarction.  She remains intermittently encephalopathic.  She had an event with asymmetric pupils on 2/25, had a repeat MRI without any new acute findings and expected evolution of known infarcts.  EEG 2/13 with moderate diffuse encephalopathy -Remains poorly responsive, mental status fluctuates though  Active Problems Dysphagia-due to stroke, poorly responsive, on core track.  Fever -Fever on 2/27, CXR with bibasilar opacities, urine culture showed yeast and completed fluconazole. Blood cultures 1/2 with staph epidermidis (likely contaminated) -Low-grade temp 100.2 last night, but afebrile this morning 97.8. Closely monitor fever curve  Intermittent agitation -waking up from her brain injury and agitated -will liberalize visitation so family can help sit with her  CKD 3A- AKI due to dehydration  Right Sided Hydronephrosis  -Baseline creatinine appears to be 1.0-1.3 -started free water per tube and watching I & O closely to prevent heart failure exacerbation -BUN & Cr continued to rise despite adequate hydration, 2/19>-Cr rose to 2.15- Ordered arenal ultrasound whichshowed right sided hydronephrosis (no mass or definite calcification identified).  Nephrology, urology consulted and she is status post right nephrostomy placement by IR on 2/21.  Creatinine has now returned to her baseline  UTI  -GBS UTI from 2/12 appears to have been treated with ceftriaxone.  2/27 yeast UTI, completed fluconazole, white count normal, recheck in the morning  Grade 2 diastolic heart failure-chronic -2D echoperformedduring this admission reveals grade 2 diastolic dysfunction -Appears euvolemic  Gluteal skin breakdown -[place] and barrier cream -will switch to an air mattress to prevent further skin breakdown -turn as able   Diabetes mellitus 2 -Continue on insulin sliding scale every 4 hours while she is on tube feeds -  basal insulin, bolus insulin - adjust  prn  CBG (last 3)  Recent Labs    04/08/20 2013 04/08/20 2333 04/09/20 0404  GLUCAP 118* 205* 97    Hypothyroidism -Continue Synthroid per tube  Hyperkalemia -Monitor potassium periodically  Scheduled Meds: . amLODipine  5 mg Per Tube Daily  . chlorhexidine  15 mL Mouth Rinse BID  . Chlorhexidine Gluconate Cloth  6 each Topical Daily  . clopidogrel  75 mg Per Tube Q breakfast  . feeding supplement (PROSource TF)  45 mL Per Tube TID  . free water  100 mL Per Tube Q6H  . heparin  5,000 Units Subcutaneous Q8H  . insulin aspart  0-9 Units Subcutaneous Q4H  . insulin aspart  2 Units Subcutaneous Q4H  . insulin detemir  5 Units Subcutaneous Daily  . levothyroxine  112 mcg Per Tube QAC breakfast  . liver oil-zinc oxide   Topical BID  . mouth rinse  15 mL Mouth Rinse q12n4p  . modafinil  200 mg Per Tube Daily  . rosuvastatin  10 mg Per Tube Daily  . sodium chloride flush  3 mL Intravenous Once  . sodium chloride flush  5 mL Intracatheter Q8H   Continuous Infusions: . sodium chloride 10 mL/hr at 03/24/20 0305  . feeding supplement (GLUCERNA 1.2 CAL) Stopped (04/08/20 0202)   PRN Meds:.acetaminophen, docusate, hydroxypropyl methylcellulose / hypromellose, iohexol, polyethylene glycol  Diet Orders (From admission, onward)    Start     Ordered   03/27/20 0001  Diet NPO time specified  Diet effective midnight       Comments: Stop tube feeds at midnight   03/26/20 1237          DVT prophylaxis: heparin injection 5,000 Units Start: 03/28/20 0600 SCDs Start: 03/18/20 1733     Code Status: DNR  Family Communication: No family at bedside, discussed with the husband on 3/4  Status is: Inpatient  Remains inpatient appropriate because:Inpatient level of care appropriate due to severity of illness   Dispo: The patient is from: Home              Anticipated d/c is to: Home              Patient currently is not medically stable to d/c.   Difficult to place patient  No  Level of care: Progressive  Consultants:  Neurology  Palliative care Nephrology Urology IR  Procedures:  Right percutaneous nephrostomy 2/21  Microbiology  Urine cultures - yeast   Antimicrobials: Fluconazole 2/27   Objective: Vitals:   04/08/20 1500 04/08/20 2011 04/08/20 2334 04/09/20 0403  BP: (!) 145/72 (!) 153/85 (!) 151/90 (!) 142/80  Pulse: 86 98 (!) 101 86  Resp: 18 19 19 18   Temp: 98.1 F (36.7 C) 98.2 F (36.8 C) 100.2 F (37.9 C) 97.8 F (36.6 C)  TempSrc: Axillary Oral Oral Oral  SpO2: 100% (!) 88% 100% 100%  Weight:      Height:        Intake/Output Summary (Last 24 hours) at 04/09/2020 [id number] Last data filed at 04/09/2020 0115 Gross per 24 hour  Intake --  Output 1405 ml  Net -1405 ml   Filed Weights   04/04/20 0438 04/05/20 0500 04/08/20 0423  Weight: 81.8 kg 79.5 kg 80 kg    Examination:  Constitutional: Obtunded Eyes: No icterus ENMT: mmm Neck: normal, supple Respiratory: Clear bilaterally without wheezing or crackles Cardiovascular: rrr, no murmurs, no edema Abdomen: Soft, nontender, nondistended, bowel sounds positive  Musculoskeletal: no clubbing / cyanosis.  Skin: No rashes seen Neurologic: Does not follow commands  Data Reviewed: I have independently reviewed following labs and imaging studies   CBC: Recent Labs  Lab 04/03/20 0135 04/04/20 0351 04/05/20 0322 04/06/20 0336 04/08/20 0232  WBC 6.5 6.4 5.5 5.6 7.4  NEUTROABS 4.3 4.2 3.3  --   --   HGB 12.2 11.7* 11.3* 11.2* 11.8*  HCT 37.6 36.8 35.0* 34.3* 36.7  MCV 99.2 99.5 99.7 99.1 98.9  PLT 290 322 PLATELET CLUMPS NOTED ON SMEAR, UNABLE TO ESTIMATE 317 382   Basic Metabolic Panel: Recent Labs  Lab 04/03/20 0135 04/04/20 0351 04/05/20 0322 04/06/20 0336 04/08/20 0232  NA 136 137 139 139 136  K 5.1 4.9 4.7 4.4 4.7  CL 98 98 101 101 97*  CO2 27 28 27 27 30   GLUCOSE 267* 199* 126* 104* 124*  BUN 45* 45* 42* 40* 40*  CREATININE 1.13* 1.15* 1.12* 1.08* 0.99   CALCIUM 9.6 9.7 9.8 9.8 9.9  MG 2.0 2.1 2.0  --   --   PHOS 4.9* 4.5 4.0  --   --    Liver Function Tests: Recent Labs  Lab 04/03/20 0135 04/04/20 0351 04/05/20 0322 04/06/20 0336 04/08/20 0232  AST 19 19 19 22 22   ALT 13 14 12 15 16   ALKPHOS 86 87 85 85 95  BILITOT 0.6 0.6 0.8 0.5 0.5  PROT 6.5 6.5 6.3* 6.2* 6.7  ALBUMIN 2.7* 2.6* 2.6* 2.5* 2.6*   Coagulation Profile: No results for input(s): INR, PROTIME in the last 168 hours. HbA1C: No results for input(s): HGBA1C in the last 72 hours. CBG: Recent Labs  Lab 04/08/20 1146 04/08/20 1617 04/08/20 2013 04/08/20 2333 04/09/20 0404  GLUCAP 152* 158* 118* 205* 97    Recent Results (from the past 240 hour(s))  Culture, blood (routine x 2)     Status: None   Collection Time: 04/03/20  7:55 AM   Specimen: BLOOD RIGHT HAND  Result Value Ref Range Status   Specimen Description BLOOD RIGHT HAND  Final   Special Requests   Final    BOTTLES DRAWN AEROBIC ONLY Blood Culture results may not be optimal due to an inadequate volume of blood received in culture bottles   Culture   Final    NO GROWTH 5 DAYS Performed at [facility], [address] [place], [facility] [address]    Report Status 04/08/2020 FINAL  Final  Culture, blood (routine x 2)     Status: Abnormal   Collection Time: 04/03/20  8:11 AM   Specimen: BLOOD LEFT ARM  Result Value Ref Range Status   Specimen Description BLOOD LEFT ARM  Final   Special Requests   Final    BOTTLES DRAWN AEROBIC AND ANAEROBIC Blood Culture adequate volume   Culture  Setup Time   Final    GRAM POSITIVE COCCI Organism ID to follow CRITICAL RESULT CALLED TO, READ BACK BY AND VERIFIED WITH: J LEDFORD PHARMD 04/04/20 0554 JDW IN BOTH AEROBIC AND ANAEROBIC BOTTLES    Culture (A)  Final    STAPHYLOCOCCUS EPIDERMIDIS THE SIGNIFICANCE OF ISOLATING THIS ORGANISM FROM A SINGLE SET OF BLOOD CULTURES WHEN MULTIPLE SETS ARE DRAWN IS UNCERTAIN. PLEASE NOTIFY THE MICROBIOLOGY DEPARTMENT  WITHIN ONE WEEK IF SPECIATION AND SENSITIVITIES ARE REQUIRED. Performed at [facility], [address] [place], [facility] [address]    Report Status 04/07/2020 FINAL  Final  Blood Culture ID Panel (Reflexed)  Status: Abnormal   Collection Time: 04/03/20  8:11 AM  Result Value Ref Range Status   Enterococcus faecalis NOT DETECTED NOT DETECTED Final   Enterococcus Faecium NOT DETECTED NOT DETECTED Final   Listeria monocytogenes NOT DETECTED NOT DETECTED Final   Staphylococcus species DETECTED (A) NOT DETECTED Final    Comment: CRITICAL RESULT CALLED TO, READ BACK BY AND VERIFIED WITH: J LEDFORD PHARMD 04/04/20 0554 JDW    Staphylococcus aureus (BCID) NOT DETECTED NOT DETECTED Final   Staphylococcus epidermidis DETECTED (A) NOT DETECTED Final    Comment: Methicillin (oxacillin) resistant coagulase negative staphylococcus. Possible blood culture contaminant (unless isolated from more than one blood culture draw or clinical case suggests pathogenicity). No antibiotic treatment is indicated for blood  culture contaminants. CRITICAL RESULT CALLED TO, READ BACK BY AND VERIFIED WITH: J LEDFORD [facility] 04/04/20 0554 JDW    Staphylococcus lugdunensis NOT DETECTED NOT DETECTED Final   Streptococcus species NOT DETECTED NOT DETECTED Final   Streptococcus agalactiae NOT DETECTED NOT DETECTED Final   Streptococcus pneumoniae NOT DETECTED NOT DETECTED Final   Streptococcus pyogenes NOT DETECTED NOT DETECTED Final   A.calcoaceticus-baumannii NOT DETECTED NOT DETECTED Final   Bacteroides fragilis NOT DETECTED NOT DETECTED Final   Enterobacterales NOT DETECTED NOT DETECTED Final   Enterobacter cloacae complex NOT DETECTED NOT DETECTED Final   Escherichia coli NOT DETECTED NOT DETECTED Final   Klebsiella aerogenes NOT DETECTED NOT DETECTED Final   Klebsiella oxytoca NOT DETECTED NOT DETECTED Final   Klebsiella pneumoniae NOT DETECTED NOT DETECTED Final   Proteus species NOT DETECTED NOT DETECTED  Final   Salmonella species NOT DETECTED NOT DETECTED Final   Serratia marcescens NOT DETECTED NOT DETECTED Final   Haemophilus influenzae NOT DETECTED NOT DETECTED Final   Neisseria meningitidis NOT DETECTED NOT DETECTED Final   Pseudomonas aeruginosa NOT DETECTED NOT DETECTED Final   Stenotrophomonas maltophilia NOT DETECTED NOT DETECTED Final   Candida albicans NOT DETECTED NOT DETECTED Final   Candida auris NOT DETECTED NOT DETECTED Final   Candida glabrata NOT DETECTED NOT DETECTED Final   Candida krusei NOT DETECTED NOT DETECTED Final   Candida parapsilosis NOT DETECTED NOT DETECTED Final   Candida tropicalis NOT DETECTED NOT DETECTED Final   Cryptococcus neoformans/gattii NOT DETECTED NOT DETECTED Final   Methicillin resistance mecA/C DETECTED (A) NOT DETECTED Final    Comment: CRITICAL RESULT CALLED TO, READ BACK BY AND VERIFIED WITHShela Commons [facility] 04/04/20 0554 JDW Performed at [facility], [address], [place], [place] [address]   Culture, Urine     Status: None   Collection Time: 04/04/20  4:32 PM   Specimen: Urine, Random  Result Value Ref Range Status   Specimen Description URINE, RANDOM  Final   Special Requests NONE  Final   Culture   Final    NO GROWTH Performed at [facility], [address] [place], [place] [address]    Report Status 04/07/2020 FINAL  Final     Radiology Studies: No results found.  Pamella Pert, MD, PhD [facility]  Between 7 am - 7 pm I am available, please contact me via Amion or Securechat  Between 7 pm - 7 am I am not available, please contact night coverage MD/APP via Amion

## 2020-04-10 DIAGNOSIS — Z66 Do not resuscitate: Secondary | ICD-10-CM | POA: Diagnosis not present

## 2020-04-10 DIAGNOSIS — N179 Acute kidney failure, unspecified: Secondary | ICD-10-CM | POA: Diagnosis not present

## 2020-04-10 DIAGNOSIS — Z7189 Other specified counseling: Secondary | ICD-10-CM | POA: Diagnosis not present

## 2020-04-10 DIAGNOSIS — Z515 Encounter for palliative care: Secondary | ICD-10-CM | POA: Diagnosis not present

## 2020-04-10 DIAGNOSIS — G9340 Encephalopathy, unspecified: Secondary | ICD-10-CM | POA: Diagnosis not present

## 2020-04-10 LAB — CBC
HCT: 35.6 % — ABNORMAL LOW (ref 36.0–46.0)
Hemoglobin: 11.7 g/dL — ABNORMAL LOW (ref 12.0–15.0)
MCH: 31.8 pg (ref 26.0–34.0)
MCHC: 32.9 g/dL (ref 30.0–36.0)
MCV: 96.7 fL (ref 80.0–100.0)
Platelets: 444 10*3/uL — ABNORMAL HIGH (ref 150–400)
RBC: 3.68 MIL/uL — ABNORMAL LOW (ref 3.87–5.11)
RDW: 12.8 % (ref 11.5–15.5)
WBC: 7.4 10*3/uL (ref 4.0–10.5)
nRBC: 0 % (ref 0.0–0.2)

## 2020-04-10 LAB — COMPREHENSIVE METABOLIC PANEL
ALT: 15 U/L (ref 0–44)
AST: 23 U/L (ref 15–41)
Albumin: 2.7 g/dL — ABNORMAL LOW (ref 3.5–5.0)
Alkaline Phosphatase: 95 U/L (ref 38–126)
Anion gap: 13 (ref 5–15)
BUN: 61 mg/dL — ABNORMAL HIGH (ref 8–23)
CO2: 27 mmol/L (ref 22–32)
Calcium: 10.2 mg/dL (ref 8.9–10.3)
Chloride: 96 mmol/L — ABNORMAL LOW (ref 98–111)
Creatinine, Ser: 1.45 mg/dL — ABNORMAL HIGH (ref 0.44–1.00)
GFR, Estimated: 35 mL/min — ABNORMAL LOW (ref >60)
Glucose, Bld: 121 mg/dL — ABNORMAL HIGH (ref 70–99)
Potassium: 4.8 mmol/L (ref 3.5–5.1)
Sodium: 136 mmol/L (ref 135–145)
Total Bilirubin: 0.7 mg/dL (ref 0.3–1.2)
Total Protein: 6.5 g/dL (ref 6.5–8.1)

## 2020-04-10 LAB — GLUCOSE, CAPILLARY
Glucose-Capillary: 125 mg/dL — ABNORMAL HIGH (ref 70–99)
Glucose-Capillary: 125 mg/dL — ABNORMAL HIGH (ref 70–99)
Glucose-Capillary: 132 mg/dL — ABNORMAL HIGH (ref 70–99)
Glucose-Capillary: 136 mg/dL — ABNORMAL HIGH (ref 70–99)
Glucose-Capillary: 146 mg/dL — ABNORMAL HIGH (ref 70–99)
Glucose-Capillary: 158 mg/dL — ABNORMAL HIGH (ref 70–99)
Glucose-Capillary: 175 mg/dL — ABNORMAL HIGH (ref 70–99)

## 2020-04-10 NOTE — Progress Notes (Signed)
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 She had gone to bedin the morning her husband had trouble waking her up.  In the ED she was noted to be responsive to pain. A code stroke was called. She was outside the window for TPA. An emergent CT did not show any acute abnormality. A CTA was performed and did not reveal any obvious stenosis.  An MRI of the brain was performed on 2/13:Punctate acute infarction in the left superior cerebellumadjacent to the superior cerebellar peduncle. Bilateral thalamic acute infarction extending back into the mid brain. This is consistent with artery of Percheron infarction.  She continues to wax and wane from a mental status standpoint, 3/1 being minimally interactive/responsive.  She has cortrak.  Palliative and neuro are following, may need peg if no improvement towards end of week.  Subjective / 24h Interval events: Remains poorly responsive. Afebrile overnight   Assessment & Plan: Principal Problem Bilateral Thalamic Infarcts Acute metabolic encephalopathy -due to CVA, mental status waxing and waning, at times she is minimally responsive versus talking some.  Neurology following, she has been placed on Provigil, dose increased initially but then had to be decreased due to increased tremulousness.  She was given a trial of amantadine but it was discontinued.  She has a prior history of embolic infarcts.  Imaging on admission showed punctate acute infarction in the left superior cerebellum adjacent to the superior cerebellar peduncle.  Bilateral thalamic acute infarction extending back into the midbrain, consistent with artery of  Percheron infarction.  She remains intermittently encephalopathic.  She had an event with asymmetric pupils on 2/25, had a repeat MRI without any new acute findings and expected evolution of known infarcts.  EEG 2/13 with moderate diffuse encephalopathy -poorly responsive, GOC later today regarding PEG decision by family   Active Problems Dysphagia-due to stroke, poorly responsive, on core track.feeding   Fever -Fever on 2/27, CXR with bibasilar opacities, urine culture showed yeast and completed fluconazole. Blood cultures 1/2 with staph epidermidis (likely contaminated) -Low-grade temp 100.2 3/5 overnight, afebrile since. Closely monitor  Intermittent agitation -waking up from her brain injury and agitated -will liberalize visitation so family can help sit with her  CKD 3A- AKI due to dehydration   Right Sided Hydronephrosis  -Baseline creatinine appears to be 1.0-1.3 -started free water per tube and watching I & O closely to prevent heart failure exacerbation -BUN & Cr continued to rise despite adequate hydration, 2/19>-Cr rose to 2.15- Ordered arenal ultrasound whichshowed right sided hydronephrosis (no mass or definite calcification identified).  Nephrology, urology consulted and she is status post right nephrostomy placement by IR on 2/21. - Cr overall stable, a bit up today to 1.4, monitor   UTI  -GBS UTI from 2/12 appears to have been treated with ceftriaxone.  2/27 yeast UTI, completed fluconazole, WBC remains normal this morning   Grade 2 diastolic heart failure-chronic -2D echoperformedduring this admission reveals grade 2 diastolic dysfunction -Appears euvolemic  Gluteal skin breakdown -[place] and barrier cream -will switch to an air mattress to prevent further skin breakdown -turn as able   Diabetes mellitus 2 -Continue on insulin sliding scale every 4 hours while she is on tube feeds -basal  insulin, bolus insulin - adjust prn -CBGs controlled as below   CBG  (last 3)  Recent Labs    04/09/20 2356 04/10/20 0423 04/10/20 0836  GLUCAP 130* 146* 158*    Hypothyroidism -Continue Synthroid per tube  Hyperkalemia -Monitor potassium periodically  Scheduled Meds:  amLODipine  5 mg Per Tube Daily   chlorhexidine  15 mL Mouth Rinse BID   Chlorhexidine Gluconate Cloth  6 each Topical Daily   clopidogrel  75 mg Per Tube Q breakfast   feeding supplement (PROSource TF)  45 mL Per Tube TID   free water  100 mL Per Tube Q6H   heparin  5,000 Units Subcutaneous Q8H   insulin aspart  0-9 Units Subcutaneous Q4H   insulin aspart  2 Units Subcutaneous Q4H   insulin detemir  5 Units Subcutaneous Daily   levothyroxine  112 mcg Per Tube QAC breakfast   liver oil-zinc oxide   Topical BID   mouth rinse  15 mL Mouth Rinse q12n4p   modafinil  200 mg Per Tube Daily   rosuvastatin  10 mg Per Tube Daily   sodium chloride flush  3 mL Intravenous Once   sodium chloride flush  5 mL Intracatheter Q8H   Continuous Infusions:  sodium chloride 10 mL/hr at 03/24/20 0305   feeding supplement (GLUCERNA 1.2 CAL) 1,000 mL (04/09/20 1213)   PRN Meds:.acetaminophen, docusate, hydroxypropyl methylcellulose / hypromellose, iohexol, polyethylene glycol  Diet Orders (From admission, onward)    Start     Ordered   03/27/20 0001  Diet NPO time specified  Diet effective midnight       Comments: Stop tube feeds at midnight   03/26/20 1237          DVT prophylaxis: heparin injection 5,000 Units Start: 03/28/20 0600 SCDs Start: 03/18/20 1733     Code Status: DNR  Family Communication: No family at bedside  Status is: Inpatient  Remains inpatient appropriate because:Inpatient level of care appropriate due to severity of illness   Dispo: The patient is from: Home              Anticipated d/c is to: Home              Patient currently is not medically stable to d/c.   Difficult to place patient No  Level of care: Progressive  Consultants:   Neurology  Palliative care Nephrology Urology IR  Procedures:  Right percutaneous nephrostomy 2/21  Microbiology  Urine cultures - yeast   Antimicrobials: Fluconazole 2/27   Objective: Vitals:   04/09/20 2237 04/09/20 2349 04/10/20 0405 04/10/20 0833  BP: 116/62 121/65 140/68 (!) 128/109  Pulse: 92 74 95 91  Resp: (!) 24 (!) 22 20 (!) 21  Temp: 98.8 F (37.1 C) 99.2 F (37.3 C) 99.2 F (37.3 C) 98.6 F (37 C)  TempSrc: Axillary Oral Axillary Axillary  SpO2: 99% 100% 99% 95%  Weight:      Height:        Intake/Output Summary (Last 24 hours) at 04/10/2020 0939 Last data filed at 04/10/2020 0601 Gross per 24 hour  Intake 1034 ml  Output 975 ml  Net 59 ml   Filed Weights   04/04/20 0438 04/05/20 0500 04/08/20 0423  Weight: 81.8 kg 79.5 kg 80 kg    Examination:  Constitutional: obtunded Eyes: no icterus  ENMT: mmm Neck: normal, supple Respiratory: CTA biL, no wheezing, no crackles  Cardiovascular: rrr, no mrg, no edema  Abdomen: soft, nt, nd, bs+ Musculoskeletal:  no clubbing / cyanosis.  Skin: no rashes  Neurologic: obtunded  Data Reviewed: I have independently reviewed following labs and imaging studies   CBC: Recent Labs  Lab 04/04/20 0351 04/05/20 0322 04/06/20 0336 04/08/20 0232 04/10/20 0311  WBC 6.4 5.5 5.6 7.4 7.4  NEUTROABS 4.2 3.3  --   --   --   HGB 11.7* 11.3* 11.2* 11.8* 11.7*  HCT 36.8 35.0* 34.3* 36.7 35.6*  MCV 99.5 99.7 99.1 98.9 96.7  PLT 322 PLATELET CLUMPS NOTED ON SMEAR, UNABLE TO ESTIMATE 317 382 444*   Basic Metabolic Panel: Recent Labs  Lab 04/04/20 0351 04/05/20 0322 04/06/20 0336 04/08/20 0232 04/10/20 0311  NA 137 139 139 136 136  K 4.9 4.7 4.4 4.7 4.8  CL 98 101 101 97* 96*  CO2 28 27 27 30 27   GLUCOSE 199* 126* 104* 124* 121*  BUN 45* 42* 40* 40* 61*  CREATININE 1.15* 1.12* 1.08* 0.99 1.45*  CALCIUM 9.7 9.8 9.8 9.9 10.2  MG 2.1 2.0  --   --   --   PHOS 4.5 4.0  --   --   --    Liver Function  Tests: Recent Labs  Lab 04/04/20 0351 04/05/20 0322 04/06/20 0336 04/08/20 0232 04/10/20 0311  AST 19 19 22 22 23   ALT 14 12 15 16 15   ALKPHOS 87 85 85 95 95  BILITOT 0.6 0.8 0.5 0.5 0.7  PROT 6.5 6.3* 6.2* 6.7 6.5  ALBUMIN 2.6* 2.6* 2.5* 2.6* 2.7*   Coagulation Profile: No results for input(s): INR, PROTIME in the last 168 hours. HbA1C: No results for input(s): HGBA1C in the last 72 hours. CBG: Recent Labs  Lab 04/09/20 1554 04/09/20 2022 04/09/20 2356 04/10/20 0423 04/10/20 0836  GLUCAP 128* 155* 130* 146* 158*    Recent Results (from the past 240 hour(s))  Culture, blood (routine x 2)     Status: None   Collection Time: 04/03/20  7:55 AM   Specimen: BLOOD RIGHT HAND  Result Value Ref Range Status   Specimen Description BLOOD RIGHT HAND  Final   Special Requests   Final    BOTTLES DRAWN AEROBIC ONLY Blood Culture results may not be optimal due to an inadequate volume of blood received in culture bottles   Culture   Final    NO GROWTH 5 DAYS Performed at [facility], [address] [place], [facility] [address]    Report Status 04/08/2020 FINAL  Final  Culture, blood (routine x 2)     Status: Abnormal   Collection Time: 04/03/20  8:11 AM   Specimen: BLOOD LEFT ARM  Result Value Ref Range Status   Specimen Description BLOOD LEFT ARM  Final   Special Requests   Final    BOTTLES DRAWN AEROBIC AND ANAEROBIC Blood Culture adequate volume   Culture  Setup Time   Final    GRAM POSITIVE COCCI Organism ID to follow CRITICAL RESULT CALLED TO, READ BACK BY AND VERIFIED WITH: J LEDFORD PHARMD 04/04/20 0554 JDW IN BOTH AEROBIC AND ANAEROBIC BOTTLES    Culture (A)  Final    STAPHYLOCOCCUS EPIDERMIDIS THE SIGNIFICANCE OF ISOLATING THIS ORGANISM FROM A SINGLE SET OF BLOOD CULTURES WHEN MULTIPLE SETS ARE DRAWN IS UNCERTAIN. PLEASE NOTIFY THE MICROBIOLOGY DEPARTMENT WITHIN ONE WEEK IF SPECIATION AND SENSITIVITIES ARE REQUIRED. Performed at [facility], [address], [place], [facility] [address]    Report Status 04/07/2020 FINAL  Final  Blood Culture ID Panel (Reflexed)  Status: Abnormal   Collection Time: 04/03/20  8:11 AM  Result Value Ref Range Status   Enterococcus faecalis NOT DETECTED NOT DETECTED Final   Enterococcus Faecium NOT DETECTED NOT DETECTED Final   Listeria monocytogenes NOT DETECTED NOT DETECTED Final   Staphylococcus species DETECTED (A) NOT DETECTED Final    Comment: CRITICAL RESULT CALLED TO, READ BACK BY AND VERIFIED WITH: J LEDFORD PHARMD 04/04/20 0554 JDW    Staphylococcus aureus (BCID) NOT DETECTED NOT DETECTED Final   Staphylococcus epidermidis DETECTED (A) NOT DETECTED Final    Comment: Methicillin (oxacillin) resistant coagulase negative staphylococcus. Possible blood culture contaminant (unless isolated from more than one blood culture draw or clinical case suggests pathogenicity). No antibiotic treatment is indicated for blood  culture contaminants. CRITICAL RESULT CALLED TO, READ BACK BY AND VERIFIED WITH: J LEDFORD [facility] 04/04/20 0554 JDW    Staphylococcus lugdunensis NOT DETECTED NOT DETECTED Final   Streptococcus species NOT DETECTED NOT DETECTED Final   Streptococcus agalactiae NOT DETECTED NOT DETECTED Final   Streptococcus pneumoniae NOT DETECTED NOT DETECTED Final   Streptococcus pyogenes NOT DETECTED NOT DETECTED Final   A.calcoaceticus-baumannii NOT DETECTED NOT DETECTED Final   Bacteroides fragilis NOT DETECTED NOT DETECTED Final   Enterobacterales NOT DETECTED NOT DETECTED Final   Enterobacter cloacae complex NOT DETECTED NOT DETECTED Final   Escherichia coli NOT DETECTED NOT DETECTED Final   Klebsiella aerogenes NOT DETECTED NOT DETECTED Final   Klebsiella oxytoca NOT DETECTED NOT DETECTED Final   Klebsiella pneumoniae NOT DETECTED NOT DETECTED Final   Proteus species NOT DETECTED NOT DETECTED Final   Salmonella species NOT DETECTED NOT DETECTED Final   Serratia marcescens NOT DETECTED NOT DETECTED  Final   Haemophilus influenzae NOT DETECTED NOT DETECTED Final   Neisseria meningitidis NOT DETECTED NOT DETECTED Final   Pseudomonas aeruginosa NOT DETECTED NOT DETECTED Final   Stenotrophomonas maltophilia NOT DETECTED NOT DETECTED Final   Candida albicans NOT DETECTED NOT DETECTED Final   Candida auris NOT DETECTED NOT DETECTED Final   Candida glabrata NOT DETECTED NOT DETECTED Final   Candida krusei NOT DETECTED NOT DETECTED Final   Candida parapsilosis NOT DETECTED NOT DETECTED Final   Candida tropicalis NOT DETECTED NOT DETECTED Final   Cryptococcus neoformans/gattii NOT DETECTED NOT DETECTED Final   Methicillin resistance mecA/C DETECTED (A) NOT DETECTED Final    Comment: CRITICAL RESULT CALLED TO, READ BACK BY AND VERIFIED WITHShela Commons [facility] 04/04/20 0554 JDW Performed at [facility] Lab, [address], [place], [place] [address]   Culture, Urine     Status: None   Collection Time: 04/04/20  4:32 PM   Specimen: Urine, Random  Result Value Ref Range Status   Specimen Description URINE, RANDOM  Final   Special Requests NONE  Final   Culture   Final    NO GROWTH Performed at [facility], [address] [place], [place] [address]    Report Status 04/07/2020 FINAL  Final     Radiology Studies: DG CHEST PORT 1 VIEW  Result Date: 04/09/2020 CLINICAL DATA:  Tachypnea. EXAM: PORTABLE CHEST 1 VIEW COMPARISON:  Radiograph 04/03/2020 FINDINGS: Weighted enteric tube with tip below the diaphragm not included in the field of view. Stable heart size and mediastinal contours. Mitral annulus calcifications. Minor bibasilar atelectasis. No pulmonary edema, large pleural effusion, or pneumothorax. Pigtail catheter in the right upper quadrant. Bilateral shoulder arthroplasties. IMPRESSION: Minor bibasilar atelectasis. Electronically Signed   By: Narda Rutherford M.D.   On:  04/09/2020 20:11    Pamella Pertostin Timera Windt, MD, PhD [facility]  Between 7 am - 7 pm I am available, please  contact me via Amion or Securechat  Between 7 pm - 7 am I am not available, please contact night coverage MD/APP via Amion

## 2020-04-10 NOTE — TOC Initial Note (Signed)
Transition of Care [facility]) - Initial/Assessment Note    Patient Details  Name: Hannah Fischer MRN: [id number] Date of Birth: [birth year]  Transition of [facility]) CM/SW Contact:    Geralynn Ochs, LCSW Phone Number: 04/10/2020, 12:39 PM  Clinical Narrative:      CSW alerted by palliative NP that patient's family has elected to place PEG and would like SNF placement, preference for [facility]. CSW met with spouse, [name], at bedside to confirm. [facility] is very close to their house, would be their top choice. Patient has been fully vaccinated and boosted. CSW sent referral and spoke with Admissions at [facility] about patient status and needs, they are reviewing referral and will get back to CSW with decision. CSW to follow.   Expected Discharge Plan: Skilled Nursing Facility Barriers to Discharge: Continued Medical Work up,Insurance Authorization   Patient Goals and CMS Choice Patient states their goals for this hospitalization and ongoing recovery are:: patient unable to participate in goal setting, unable to follow commands consistently CMS Medicare.gov Compare Post Acute Care list provided to:: Patient Represenative (must comment) Choice offered to / list presented to : Spouse  Expected Discharge Plan and Services Expected Discharge Plan: [place] Acute Care Choice: [place] Living arrangements for the past 2 months: Single Family Home                                      Prior Living Arrangements/Services Living arrangements for the past 2 months: Single Family Home Lives with:: Spouse Patient language and need for interpreter reviewed:: No Do you feel safe going back to the place where you live?: Yes      Need for Family Participation in Patient Care: Yes (Comment) Care giver support system in place?: No (comment)   Criminal Activity/Legal Involvement Pertinent to Current Situation/Hospitalization: No -  Comment as needed  Activities of Daily Living Home Assistive Devices/Equipment: Dentures (specify type),Eyeglasses ADL Screening (condition at time of admission) Patient's cognitive ability adequate to safely complete daily activities?: Yes Is the patient deaf or have difficulty hearing?: No Does the patient have difficulty seeing, even when wearing glasses/contacts?: No Does the patient have difficulty concentrating, remembering, or making decisions?: No Patient able to express need for assistance with ADLs?: Yes Does the patient have difficulty dressing or bathing?: No Independently performs ADLs?: Yes (appropriate for developmental age) Does the patient have difficulty walking or climbing stairs?: No Weakness of Legs: None Weakness of Arms/Hands: Left  Permission Sought/Granted Permission sought to share information with : Facility Retail banker granted to share information with : Yes, Verbal Permission Granted  Share Information with NAME: [name]  Permission granted to share info w AGENCY: SNF  Permission granted to share info w Relationship: Spouse     Emotional Assessment   Attitude/Demeanor/Rapport: Unable to Assess Affect (typically observed): Unable to Assess   Alcohol / Substance Use: Not Applicable Psych Involvement: No (comment)  Admission diagnosis:  Unresponsive [R41.89] Acute encephalopathy [G93.40] Stroke-like symptoms [R29.90] Altered mental status, unspecified altered mental status type [R41.82] Patient Active Problem List   Diagnosis Date Noted  . History of embolic stroke 40/98/1191  . Acute encephalopathy 03/18/2020  . Gait abnormality 01/19/2020  . Chest pressure 01/19/2020  . Cerebral amyloid angiopathy (CODE)   . Stroke ([place]) 11/24/2019  . AKI (acute kidney injury) ([place]) 11/24/2019  . Spinal stenosis  of lumbar region with neurogenic claudication 10/26/2019  . Abnormality of gait 08/02/2019  . Cerebrovascular  accident (CVA) ([place]) 07/22/2019  . Dizziness 06/15/2019  . Brain aneurysm 06/15/2019  . Chronic pain syndrome 04/29/2019  . Chronic right-sided low back pain without sciatica 04/12/2019  . Arthritis of shoulder region, left, degenerative 12/03/2013  . Nonspecific abnormal electrocardiogram (ECG) (EKG) 10/15/2013  . Other and unspecified angina pectoris 10/15/2013  . TIA (transient ischemic attack) 11/15/2011  . HTN (hypertension) 11/15/2011  . Diabetes mellitus ([place]) 11/15/2011  . Hypothyroidism 11/15/2011  . Dyslipidemia 11/15/2011  . GERD (gastroesophageal reflux disease) 11/15/2011  . Pyuria 11/15/2011   PCP:  [name], MD Pharmacy:   [facility] [place] [place] - [address] [place] [address] Phone: [phone] Fax: [phone]     Social Determinants of Health (SDOH) Interventions    Readmission Risk Interventions No flowsheet data found.

## 2020-04-10 NOTE — Progress Notes (Signed)
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  Palliative care was consulted to aid in goals of care conversations.  Today's Discussion (04/10/2020):  *Please note that this is a verbal dictation therefore any spelling or grammatical errors are due to the "[place] [facility]" system interpretation.  Chart reviewed.   I assessed Hannah Fischer this late morning. She was very somnolent and not as arousable as on prior days.  I met with patients husband, [name] and his son, [name] at bedside.  We discussed how [name] has not shown great improvements over this past week. Hannah Fischer shares that she does still respond to him in voice. He states that he is not ready to let her go. I asked him if it is time to then consider PEG placement which he states he has thought about and would like to pursue. We reviewed what PEG placement would look like in terms of timing and how Savina would then need to go to a facility which could address her complex medical needs. Hannah Fischer is aware of this. He requests that she go to [place] in [place].   I shared that I worry [name] may not improve tremendously beyond this point. I asked if Hannah Fischer would be open to having an OP Palliative provider follow along in the event that Hannah Fischer further declines it would enable a decision for hospice care to be made more promptly. Hannah Fischer is in agreement with this.  At present the plan will be for PEG placement.  Objective Assessment: Vital Signs Vitals:   04/10/20 0405 04/10/20 0833  BP: 140/68 (!) 128/109  Pulse:  95 91  Resp: 20 (!) 21  Temp: 99.2 F (37.3 C) 98.6 F (37 C)  SpO2: 99% 95%    Intake/Output Summary (Last 24 hours) at 04/10/2020 1114 Last data filed at 04/10/2020 0601 Gross per 24 hour  Intake 1034 ml  Output 975 ml  Net 59 ml   Last Weight  Most recent update: 04/08/2020  4:23 AM   Weight  80 kg (176 lb 5.9 oz)           Gen:  Elderly F in NAD HEENT: Coretrack in place, dry mucous membranes CV: Irregular rate and rhythm  PULM: 1LPM [place] ABD: soft/nontender  EXT: No edema  Neuro:  Somnolent this afternoon  SUMMARY OF RECOMMENDATIONS DNAR/DNI  MOST/GOLD DNR on the chart and in [place] for PEG Placement  Patients husband would like for her to go to [facility] in [place] has been updated on this request  Patient will need comprehensive OP Palliative follow up  Ongoing PMT support I will not be back on service until Friday but if additional aid is needed please call our team phone number at [phone]  Time Spent:  35 minutes Greater than 50% of the time was spent in counseling and coordination of care ______________________________________________________________________________________ [facility] Team Team Cell Phone: [phone] Please utilize secure chat with additional questions, if there is no response within 30 minutes please call the above phone number  Palliative Medicine Team providers are available by phone from 7am to  7pm daily and can be reached through the team cell phone.  Should this patient require assistance outside of these hours, please call the patient's attending physician.

## 2020-04-10 NOTE — [place] FL2 (Signed)
[place] MEDICAID FL2 LEVEL OF CARE SCREENING TOOL     IDENTIFICATION  Patient Name: Hannah Fischer Birthdate: 1932-10-03 Sex: female Admission Date (Current Location): 03/18/2020  [facility] [place] and [place] Number:  Producer, television/film/video and Address:  [facility], [address] [place], [place] [address]      Provider Number: [id number]  Attending Physician Name and Address:  Leatha Gilding, MD  Relative Name and Phone Number:       Current Level of Care: Hospital Recommended Level of Care: Skilled Nursing Facility Prior Approval Number:    Date Approved/Denied:   PASRR Number: [id number]  Discharge Plan: SNF    Current Diagnoses: Patient Active Problem List   Diagnosis Date Noted   History of embolic stroke 03/22/2020   Acute encephalopathy 03/18/2020   Gait abnormality 01/19/2020   Chest pressure 01/19/2020   Cerebral amyloid angiopathy (CODE)    Stroke (HCC) 11/24/2019   AKI (acute kidney injury) (HCC) 11/24/2019   Spinal stenosis of lumbar region with neurogenic claudication 10/26/2019   Abnormality of gait 08/02/2019   Cerebrovascular accident (CVA) (HCC) 07/22/2019   Dizziness 06/15/2019   Brain aneurysm 06/15/2019   Chronic pain syndrome 04/29/2019   Chronic right-sided low back pain without sciatica 04/12/2019   Arthritis of shoulder region, left, degenerative 12/03/2013   Nonspecific abnormal electrocardiogram (ECG) (EKG) 10/15/2013   Other and unspecified angina pectoris 10/15/2013   TIA (transient ischemic attack) 11/15/2011   HTN (hypertension) 11/15/2011   Diabetes mellitus (HCC) 11/15/2011   Hypothyroidism 11/15/2011   Dyslipidemia 11/15/2011   GERD (gastroesophageal reflux disease) 11/15/2011   Pyuria 11/15/2011    Orientation RESPIRATION BLADDER Height & Weight      (unable to assess)  Normal Incontinent Weight: 176 lb 5.9 oz (80 kg) Height:  5\' 3"  (160 cm)  BEHAVIORAL SYMPTOMS/MOOD  NEUROLOGICAL BOWEL NUTRITION STATUS      Incontinent Feeding tube (Glucerna 1.2, 45 mL/hr)  AMBULATORY STATUS COMMUNICATION OF NEEDS Skin   Total Care Does not communicate Surgical wounds (right flank, gauze dressing)                       Personal Care Assistance Level of Assistance  Bathing,Feeding,Dressing Bathing Assistance: Maximum assistance Feeding assistance: Maximum assistance Dressing Assistance: Maximum assistance     Functional Limitations Info  Sight,Hearing,Speech Sight Info: Impaired Hearing Info: Impaired Speech Info: Impaired    SPECIAL CARE FACTORS FREQUENCY  PT (By licensed PT),OT (By licensed OT),Speech therapy     PT Frequency: 5x/wk OT Frequency: 5x/wk     Speech Therapy Frequency: 5x/wk      Contractures Contractures Info: Not present    Additional Factors Info  Code Status,Allergies Code Status Info: DNR Allergies Info: Atorvastatin, Ezetimibe-simvastatin, Lovastatin, Metformin Hcl, Pravastatin, Codeine, Simvastatin, Sulfa Antibiotics, Sulfites           Current Medications (04/10/2020):  This is the current hospital active medication list Current Facility-Administered Medications  Medication Dose Route Frequency Provider Last Rate Last Admin   0.9 %  sodium chloride infusion  250 mL Intravenous Continuous Stretch, 06/10/2020, MD 10 mL/hr at 03/24/20 0305 Restarted at 03/24/20 0305   acetaminophen (TYLENOL) tablet 650 mg  650 mg Per NG tube Q6H PRN Chotiner, 03/26/20, MD   650 mg at 04/09/20 1855   amLODipine (NORVASC) tablet 5 mg  5 mg Per Tube Daily 06/09/20, MD   5 mg at 04/10/20 0836   chlorhexidine (PERIDEX) 0.12 %  solution 15 mL  15 mL Mouth Rinse BID Steffanie Dunn, DO   15 mL at 04/10/20 3474   Chlorhexidine Gluconate Cloth 2 % PADS 6 each  6 each Topical Daily Steffanie Dunn, DO   6 each at 04/10/20 2595   clopidogrel (PLAVIX) tablet 75 mg  75 mg Per Tube Q breakfast Leander Rams, RPH   75 mg at 04/10/20 6387   docusate  (COLACE) 50 MG/5ML liquid 100 mg  100 mg Per Tube BID PRN Kathlen Mody, MD       feeding supplement (GLUCERNA 1.2 CAL) liquid 1,000 mL  1,000 mL Per Tube Continuous Leatha Gilding, MD 45 mL/hr at 04/09/20 1213 1,000 mL at 04/09/20 1213   feeding supplement (PROSource TF) liquid 45 mL  45 mL Per Tube TID Olalere, Adewale A, MD   45 mL at 04/10/20 0837   free water 100 mL  100 mL Per Tube Q6H Leatha Gilding, MD   100 mL at 04/10/20 0525   heparin injection 5,000 Units  5,000 Units Subcutaneous Q8H Ralene Muskrat, PA-C   5,000 Units at 04/10/20 5643   hydroxypropyl methylcellulose / hypromellose (ISOPTO TEARS / GONIOVISC) 2.5 % ophthalmic solution 1 drop  1 drop Both Eyes QID PRN Zigmund Daniel., MD   1 drop at 04/02/20 1051   insulin aspart (novoLOG) injection 0-9 Units  0-9 Units Subcutaneous Q4H Zigmund Daniel., MD   2 Units at 04/10/20 3295   insulin aspart (novoLOG) injection 2 Units  2 Units Subcutaneous Q4H Zigmund Daniel., MD   2 Units at 04/10/20 0836   insulin detemir (LEVEMIR) injection 5 Units  5 Units Subcutaneous Daily Zigmund Daniel., MD   5 Units at 04/10/20 0839   iohexol (OMNIPAQUE) 300 MG/ML solution 50 mL  50 mL Per Tube Once PRN Suttle, Thressa Sheller, MD       levothyroxine (SYNTHROID) tablet 112 mcg  112 mcg Per Tube QAC breakfast Kathlen Mody, MD   112 mcg at 04/10/20 0525   liver oil-zinc oxide (DESITIN) 40 % ointment   Topical BID Calvert Cantor, MD   Given at 04/10/20 0839   MEDLINE mouth rinse  15 mL Mouth Rinse q12n4p Karie Fetch P, DO   15 mL at 04/09/20 1713   modafinil (PROVIGIL) tablet 200 mg  200 mg Per Tube Daily Lodema Hong A, RPH   200 mg at 04/10/20 1884   polyethylene glycol (MIRALAX / GLYCOLAX) packet 17 g  17 g Per Tube Daily PRN Kathlen Mody, MD       rosuvastatin (CRESTOR) tablet 10 mg  10 mg Per Tube Daily Calvert Cantor, MD   10 mg at 04/10/20 0836   sodium chloride flush (NS) 0.9 % injection 3 mL  3 mL  Intravenous Once Milagros Loll, MD       sodium chloride flush (NS) 0.9 % injection 5 mL  5 mL Intracatheter Q8H Suttle, Thressa Sheller, MD   5 mL at 04/10/20 1660     Discharge Medications: Please see discharge summary for a list of discharge medications.  Relevant Imaging Results:  Relevant Lab Results:   Additional Information SS#: [id number]  Baldemar Lenis, LCSW

## 2020-04-10 NOTE — Progress Notes (Signed)
Occupational Therapy Treatment Patient Details Name: Hannah Fischer MRN: [id number] DOB: 1932/10/20 Today's Date: 04/10/2020    History of present illness 87 y.o. female presenting code stroke. Imaging (+) for bilateral medial thalamic and punctate infarcts and L superior cerebellar infarcts. PMHx significant for multiple CVAs with most recent acute/subacute R frontal lobe and L parietal and occipital lobe infarcts ~3 months ago, residual L-sided hemiplegia with recent d/c from outpatient PT/OT with return to baseline level of function, TIA, DM, PE, anxiety, HTN, and HLD.   OT comments  Pt total A for all ADL and total A +2 with mobility. No initiation of movement/Not following commands. Occasionally responds yes/no to husband/son. Eyes closed entire session. Pt with increased tightness R hand/flexors - placed order for R resting hand splint. Will establish schedule. Will continue to follow.   Follow Up Recommendations  SNF    Equipment Recommendations  Hospital bed    Recommendations for Other Services      Precautions / Restrictions Precautions Precautions: Fall Precaution Comments: cortrak, bilateral mittens, nephrostomy drain; at risk for skin breakdown Restrictions Weight Bearing Restrictions: No       Mobility Bed Mobility               General bed mobility comments: total A +2    Transfers  will need Maximove                    Balance                                           ADL either performed or assessed with clinical judgement   ADL                                               Vision       Perception     Praxis      Cognition Arousal/Alertness: Lethargic Behavior During Therapy: Flat affect Overall Cognitive Status: Impaired/Different from baseline                                 General Comments: Eyes closed throughout entire session. Would respond yes/no inconsistently to  husband/son; not following commands; no initiation of movement        Exercises General Exercises - Upper Extremity Shoulder Flexion: PROM;10 reps;Supine;Right;Left Elbow Flexion: PROM;Left;Right;10 reps;Supine Elbow Extension: PROM;Right;Left;10 reps;Supine Wrist Flexion: PROM;10 reps;Right;Left;Supine Wrist Extension: PROM;Right;Left;10 reps;Supine Digit Composite Flexion: PROM;Both;5 reps Composite Extension: PROM;Both;10 reps   Shoulder Instructions       General Comments      Pertinent Vitals/ Pain       Pain Assessment: Faces Faces Pain Scale: Hurts a little bit Pain Location: with cervical movement adn R hand ROM Pain Descriptors / Indicators: Grimacing Pain Intervention(s): Limited activity within patient's tolerance  Home Living                                          Prior Functioning/Environment              Frequency  Min 1X/week  Progress Toward Goals  OT Goals(current goals can now be found in the care plan section)  Progress towards OT goals: Not progressing toward goals - comment  Acute Rehab OT Goals Patient Stated Goal: per husband for his wife to get better OT Goal Formulation: With family Time For Goal Achievement: 04/18/20 Potential to Achieve Goals: Fair ADL Goals Additional ADL Goal #1: Patient will keep eyes open for 50% of OT treatment session demonstrating increased alertness and level of arousal. Additional ADL Goal #2: Patient will maintain static sitting balance at EOB with no more than Min A in prep for seated ADLs. Additional ADL Goal #3: Patient will attend to 1 grooming task >1-3 min with Mod cues and hand over hand assist.  Plan Discharge plan remains appropriate;Frequency remains appropriate    Co-evaluation                 AM-PAC OT "6 Clicks" Daily Activity     Outcome Measure   Help from another person eating meals?: Total Help from another person taking care of personal grooming?:  Total Help from another person toileting, which includes using toliet, bedpan, or urinal?: Total Help from another person bathing (including washing, rinsing, drying)?: Total Help from another person to put on and taking off regular upper body clothing?: Total Help from another person to put on and taking off regular lower body clothing?: Total 6 Click Score: 6    End of Session    OT Visit Diagnosis: Unsteadiness on feet (R26.81);Other abnormalities of gait and mobility (R26.89);Muscle weakness (generalized) (M62.81);Hemiplegia and hemiparesis;Other symptoms and signs involving cognitive function;Pain Hemiplegia - Right/Left: Right Hemiplegia - dominant/non-dominant: Dominant Hemiplegia - caused by: Cerebral infarction Pain - part of body:  (generalized)   Activity Tolerance Patient limited by lethargy   Patient Left in bed;with call bell/phone within reach;with family/visitor present;with restraints reapplied   Nurse Communication Other (comment) (R resting hand splint ordered; restraint left off R hand - nsg aware as pt is not moving R hand)        Time: 4235-3614 OT Time Calculation (min): 21 min  Charges: OT General Charges $OT Visit: 1 Visit OT Treatments $Therapeutic Activity: 8-22 mins  Luisa Dago, OT/L   Acute OT Clinical Specialist Acute Rehabilitation Services Pager [phone] Office [phone]    [facility] 04/10/2020, 12:41 PM

## 2020-04-10 NOTE — Progress Notes (Signed)
Physical Therapy Treatment Patient Details Name: Hannah Fischer MRN: [id number] DOB: 1932/10/22 Today's Date: 04/10/2020    History of Present Illness Pt is an 87 y/o female admitted 03/18/20 with CVA. Imaging (+) for bilateral medial thalamic and punctate infarcts and L superior cerebellar infarcts. PMH significant for multiple CVAs with most recent ~3 months ago, residual L-sided hemiplegia with recent d/c from outpatient PT/OT with return to baseline level of function, TIA, DM, PE, HTN.    PT Comments    Pt progressing very slowly towards physical therapy goals. Pt sat EOB ~18 minutes with +2 assist. Therapist in front facilitating functional activity with tech posteriorly providing truncal support. Therapist facilitated pt wiping eyes and mouth with a washcloth. Once washcloth against skin, pt initiating wiping motion. Pt also maintaining a propped position laterally on both sides with assist to get into position. Son and husband present throughout session and attempting to communicate with pt, however with no eye opening or obvious response. Pt appeared to attempt a smile occasionally to son's voice but was very faint. Of note, pt holding B hands in a fist within mitts. It appeared that fingernails were digging into palms. Washcloths were rolled and placed into hands within mitts to avoid a pressure injury. Occupational therapist notified to assess next session. Will continue to follow and progress as able per POC.     Follow Up Recommendations  SNF;Supervision/Assistance - 24 hour     Equipment Recommendations  Wheelchair cushion (measurements PT);Wheelchair (measurements PT);Hospital bed    Recommendations for Other Services       Precautions / Restrictions Precautions Precautions: Fall Precaution Comments: cortrak, bilateral mittens, nephrostomy drain, at risk for skin breakdown Restrictions Weight Bearing Restrictions: No    Mobility  Bed Mobility Overal bed mobility: Needs  Assistance Bed Mobility: Rolling Rolling: Total assist;+2 for physical assistance Sidelying to sit: Total assist;+2 for physical assistance;HOB elevated   Sit to supine: Total assist;+2 for physical assistance;+2 for safety/equipment   General bed mobility comments: +2 assist required for all aspects of bed mobility.    Transfers                    Ambulation/Gait                 Stairs             Wheelchair Mobility    Modified Rankin (Stroke Patients Only) Modified Rankin (Stroke Patients Only) Pre-Morbid Rankin Score: No significant disability Modified Rankin: Severe disability     Balance Overall balance assessment: Needs assistance Sitting-balance support: Feet unsupported;No upper extremity supported Sitting balance-Leahy Scale: Poor Sitting balance - Comments: min-modA for sitting balance. forward head posture with no movement initiated with cueing for head extension. Instances of min guard, however heavy lean towards R. Simulated posterior LOB, with patient gripping therapist hands for initiation of correction. Sitting EOB ~15 minutes Postural control: Right lateral lean;Posterior lean                                  Cognition Arousal/Alertness: Lethargic Behavior During Therapy: Flat affect Overall Cognitive Status: Impaired/Different from baseline Area of Impairment: Orientation;Attention;Following commands;Awareness;Problem solving                 Orientation Level: Disoriented to;Time;Situation;Person;Place Current Attention Level: Focused Memory: Decreased short-term memory;Decreased recall of precautions Following Commands: Follows one step commands inconsistently;Follows multi-step commands inconsistently Safety/Judgement: Decreased awareness of  safety;Decreased awareness of deficits Awareness: Intellectual Problem Solving: Slow processing;Decreased initiation;Requires verbal cues;Requires tactile  cues;Difficulty sequencing General Comments: Eyes closed throughout entire session.      Exercises General Exercises - Upper Extremity Shoulder Flexion: PROM;10 reps;Supine;Right;Left Elbow Flexion: PROM;Left;Right;10 reps;Supine Elbow Extension: PROM;Right;Left;10 reps;Supine Wrist Flexion: PROM;10 reps;Right;Left;Supine Wrist Extension: PROM;Right;Left;10 reps;Supine Digit Composite Flexion: PROM;Both;5 reps Composite Extension: PROM;Both;10 reps Other Exercises Other Exercises: Pt with lateral lean to R and L. Pt engaging shoulder girdle and stabilizing while leaning onto her side. Assist required to push back up to midline. Other Exercises: 3x20" pec stretch in sitting with cervical extension to neutral    General Comments        Pertinent Vitals/Pain Pain Assessment: Faces Faces Pain Scale: Hurts little more Pain Location: with cervical extension and bilateral hand ROM Pain Descriptors / Indicators: Grimacing Pain Intervention(s): Limited activity within patient's tolerance;Monitored during session;Repositioned    Home Living                      Prior Function            PT Goals (current goals can now be found in the care plan section) Acute Rehab PT Goals Patient Stated Goal: per husband for his wife to get better PT Goal Formulation: Patient unable to participate in goal setting Time For Goal Achievement: 04/17/20 Potential to Achieve Goals: Fair Progress towards PT goals: Not progressing toward goals - comment (Very slowly)    Frequency    Min 3X/week      PT Plan Current plan remains appropriate    Co-evaluation              AM-PAC PT "6 Clicks" Mobility   Outcome Measure  Help needed turning from your back to your side while in a flat bed without using bedrails?: Total Help needed moving from lying on your back to sitting on the side of a flat bed without using bedrails?: Total Help needed moving to and from a bed to a chair  (including a wheelchair)?: Total Help needed standing up from a chair using your arms (e.g., wheelchair or bedside chair)?: Total Help needed to walk in hospital room?: Total Help needed climbing 3-5 steps with a railing? : Total 6 Click Score: 6    End of Session   Activity Tolerance: Patient limited by lethargy Patient left: in bed;with call bell/phone within reach;with bed alarm set;with SCD's reapplied Nurse Communication: Mobility status;Need for lift equipment PT Visit Diagnosis: Other symptoms and signs involving the nervous system (R29.898);Difficulty in walking, not elsewhere classified (R26.2);Unsteadiness on feet (R26.81);Muscle weakness (generalized) (M62.81)     Time: 4268-3419 PT Time Calculation (min) (ACUTE ONLY): 30 min  Charges:  $Therapeutic Activity: 23-37 mins                     Conni Slipper, PT, DPT Acute Rehabilitation Services Pager: [phone] Office: [phone]    Marylynn Pearson 04/10/2020, 2:20 PM

## 2020-04-10 NOTE — Progress Notes (Signed)
Orthopedic Tech Progress Note Patient Details:  Hannah Fischer 1932/12/22 [id number] Ordered pt resting hand splint Patient ID: [name], female   DOB: 07-20-1932, 87 y.o.   MRN: [id number]   [name] 04/10/2020, 3:46 PM

## 2020-04-10 NOTE — Progress Notes (Signed)
  Speech Language Pathology Treatment: Dysphagia  Patient Details Name: Hannah Fischer MRN: [id number] DOB: 06/13/1932 Today's Date: 04/10/2020 Time: 9937-1696 SLP Time Calculation (min) (ACUTE ONLY): 24 min  Assessment / Plan / Recommendation Clinical Impression  SLP attempted oral care and provided Max multimodal cues to make pt more arousable. Despite efforts, pt remained lethargic and largely unresponsive enough to attempt PO trials. She showed no awareness of the trials that were presented to her including a dry tsp and a small amount of applesauce. Provided education to her husband and son about pt's ability to get adequate nutrition even if she were to take PO by mouth. Recommend that the pt remain NPO and SLP will continue to follow up.     HPI HPI: Hannah Fischer is a 87 y.o. female with bilateral medial thalamic and punctate left superior cerebellar infarcts. PMHx significant for multiple CVAs with most recent acute/subacute R frontal lobe and L parietal and occipital lobe infarcts ~3 months ago, residual L-sided hemiplegia, hypothyroidism, GERD, HTN, TIA, DM, PE, anxiety, HTN, and HLD.      SLP Plan  Continue with current plan of care       Recommendations  Diet recommendations: NPO Medication Administration: Via alternative means                Oral Care Recommendations: Oral care QID Follow up Recommendations: Skilled Nursing facility SLP Visit Diagnosis: Dysphagia, oropharyngeal phase (R13.12) Plan: Continue with current plan of care       GO                Zettie Cooley., SLP Student 04/10/2020, 1:53 PM

## 2020-04-11 DIAGNOSIS — N179 Acute kidney failure, unspecified: Secondary | ICD-10-CM | POA: Diagnosis not present

## 2020-04-11 LAB — BASIC METABOLIC PANEL
Anion gap: 12 (ref 5–15)
BUN: 62 mg/dL — ABNORMAL HIGH (ref 8–23)
CO2: 28 mmol/L (ref 22–32)
Calcium: 10.3 mg/dL (ref 8.9–10.3)
Chloride: 96 mmol/L — ABNORMAL LOW (ref 98–111)
Creatinine, Ser: 1.31 mg/dL — ABNORMAL HIGH (ref 0.44–1.00)
GFR, Estimated: 40 mL/min — ABNORMAL LOW (ref >60)
Glucose, Bld: 188 mg/dL — ABNORMAL HIGH (ref 70–99)
Potassium: 4.9 mmol/L (ref 3.5–5.1)
Sodium: 136 mmol/L (ref 135–145)

## 2020-04-11 LAB — GLUCOSE, CAPILLARY
Glucose-Capillary: 197 mg/dL — ABNORMAL HIGH (ref 70–99)
Glucose-Capillary: 170 mg/dL — ABNORMAL HIGH (ref 70–99)
Glucose-Capillary: 214 mg/dL — ABNORMAL HIGH (ref 70–99)
Glucose-Capillary: 161 mg/dL — ABNORMAL HIGH (ref 70–99)
Glucose-Capillary: 110 mg/dL — ABNORMAL HIGH (ref 70–99)

## 2020-04-11 LAB — CBC
HCT: 34.8 % — ABNORMAL LOW (ref 36.0–46.0)
Hemoglobin: 11.5 g/dL — ABNORMAL LOW (ref 12.0–15.0)
MCH: 32 pg (ref 26.0–34.0)
MCHC: 33 g/dL (ref 30.0–36.0)
MCV: 96.9 fL (ref 80.0–100.0)
Platelets: 408 10*3/uL — ABNORMAL HIGH (ref 150–400)
RBC: 3.59 MIL/uL — ABNORMAL LOW (ref 3.87–5.11)
RDW: 12.8 % (ref 11.5–15.5)
WBC: 7.1 10*3/uL (ref 4.0–10.5)
nRBC: 0 % (ref 0.0–0.2)

## 2020-04-11 NOTE — Plan of Care (Signed)
  Problem: Clinical Measurements: Goal: Will remain free from infection Outcome: Progressing Goal: Diagnostic test results will improve Outcome: Progressing Goal: Respiratory complications will improve Outcome: Progressing Goal: Cardiovascular complication will be avoided Outcome: Progressing   Problem: Activity: Goal: Risk for activity intolerance will decrease Outcome: Progressing   

## 2020-04-11 NOTE — Progress Notes (Signed)
Occupational Therapy Treatment Patient Details Name: Hannah Fischer MRN: [id number] DOB: Jul 15, 1932 Today's Date: 04/11/2020    History of present illness Pt is an 87 y/o female admitted 03/18/20 with CVA. Imaging (+) for bilateral medial thalamic and punctate infarcts and L superior cerebellar infarcts. PMH significant for multiple CVAs with most recent ~3 months ago, residual L-sided hemiplegia with recent d/c from outpatient PT/OT with return to baseline level of function, TIA, DM, PE, HTN.   OT comments  Patient met lying supine in bed. Patient mumbles in response to this therapists greetings but does not open eyes or attempt to assist with BUE ROM. Spontaneous movement noted in RUE in response to painful stimuli with PROM. Noted tightness at wrist, MCPs, and PIPs of R hand and increased moisture in L palm as patient keeps hand closed inside of L mitten restraint. Per RN, R hand remains closed throughout the day. OT modified and trialed R prefabricated resting hand splint and L palm guard to decrease risk of contractures and maintain skin integrity. Patient tolerated resting hand splint >2hr with no apparent redness or skin breakdown. OT to continue to follow acutely to build splint wearing tolerance, establish wearing schedule, and education nursing staff and family on proper wearing and positioning to maintain skin integrity and decrease risk of contractures.    Follow Up Recommendations  SNF    Equipment Recommendations  Hospital bed    Recommendations for Other Services      Precautions / Restrictions Precautions Precautions: Fall Precaution Comments: cortrak, L mitten restraint, at risk for skin breakdown Restrictions Weight Bearing Restrictions: No       Mobility Bed Mobility                    Transfers                      Balance                                           ADL either performed or assessed with clinical judgement    ADL                                               Vision       Perception     Praxis      Cognition Arousal/Alertness: Lethargic Behavior During Therapy: Flat affect Overall Cognitive Status: Impaired/Different from baseline Area of Impairment: Orientation;Attention;Following commands;Awareness;Problem solving                 Orientation Level: Disoriented to;Time;Situation;Person;Place Current Attention Level: Focused Memory: Decreased short-term memory;Decreased recall of precautions Following Commands: Follows one step commands inconsistently;Follows multi-step commands inconsistently Safety/Judgement: Decreased awareness of safety;Decreased awareness of deficits Awareness: Intellectual Problem Solving: Slow processing;Decreased initiation;Requires verbal cues;Requires tactile cues;Difficulty sequencing General Comments: Eyes closed throughout entire session. Does not attempt to assist with BUE AROM.        Exercises Exercises: Other exercises General Exercises - Upper Extremity Wrist Flexion: PROM;10 reps;Right;Left;Supine Wrist Extension: PROM;Right;Left;10 reps;Supine Digit Composite Flexion: PROM;Both;5 reps Composite Extension: PROM;Both;10 reps   Shoulder Instructions       General Comments Patient with continued decreased level of arousal similar to initial evaluation. No significant difference noted.  Pertinent Vitals/ Pain       Pain Assessment: Faces Faces Pain Scale: Hurts a little bit Pain Location: w/ PROM at L hand Pain Descriptors / Indicators: Grimacing Pain Intervention(s): Limited activity within patient's tolerance;Monitored during session;Repositioned  Home Living                                          Prior Functioning/Environment              Frequency  Min 1X/week        Progress Toward Goals  OT Goals(current goals can now be found in the care plan section)  Progress  towards OT goals: Not progressing toward goals - comment  Acute Rehab OT Goals Patient Stated Goal: per husband for his wife to get better OT Goal Formulation: With family Time For Goal Achievement: 04/18/20 Potential to Achieve Goals: Fair ADL Goals Additional ADL Goal #1: Patient will keep eyes open for 50% of OT treatment session demonstrating increased alertness and level of arousal. Additional ADL Goal #2: Patient will maintain static sitting balance at EOB with no more than Min A in prep for seated ADLs. Additional ADL Goal #3: Patient will attend to 1 grooming task >1-3 min with Mod cues and hand over hand assist.  Plan Discharge plan remains appropriate;Frequency remains appropriate    Co-evaluation                 AM-PAC OT "6 Clicks" Daily Activity     Outcome Measure   Help from another person eating meals?: Total Help from another person taking care of personal grooming?: Total Help from another person toileting, which includes using toliet, bedpan, or urinal?: Total Help from another person bathing (including washing, rinsing, drying)?: Total Help from another person to put on and taking off regular upper body clothing?: Total Help from another person to put on and taking off regular lower body clothing?: Total 6 Click Score: 6    End of Session    OT Visit Diagnosis: Unsteadiness on feet (R26.81);Other abnormalities of gait and mobility (R26.89);Muscle weakness (generalized) (M62.81);Hemiplegia and hemiparesis;Other symptoms and signs involving cognitive function;Pain Hemiplegia - Right/Left: Right Hemiplegia - dominant/non-dominant: Dominant Hemiplegia - caused by: Cerebral infarction Pain - part of body:  (generalized)   Activity Tolerance Patient limited by lethargy   Patient Left in bed;with call bell/phone within reach;with family/visitor present;with restraints reapplied   Nurse Communication Other (comment) (Splint wearing schedule established.)        Time: 2508-7199 OT Time Calculation (min): 20 min  Time: 4129-0475 OT Time Calculation (min): 8 min   Charges:OT General Charges $OT Visit: 2 Visit OT Treatments $Therapeutic Activity: 23-37 mins   Lorisa Scheid H. OTR/L Supplemental OT, Department of rehab services [phone]   Deeann Saint R Howerton-Davis 04/11/2020, 2:10 PM

## 2020-04-11 NOTE — Progress Notes (Signed)
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 She had gone to bedin the morning her husband had trouble waking her up.  In the ED she was noted to be responsive to pain. A code stroke was called. She was outside the window for TPA. An emergent CT did not show any acute abnormality. A CTA was performed and did not reveal any obvious stenosis.  An MRI of the brain was performed on 2/13:Punctate acute infarction in the left superior cerebellumadjacent to the superior cerebellar peduncle. Bilateral thalamic acute infarction extending back into the mid brain. This is consistent with artery of Percheron infarction.  She continues to wax and wane from a mental status standpoint.  She has cortrak.  Palliative and neuro are following, family eventually decided for PEG which is now pending Plavix washout  Subjective / 24h Interval events: Remains unresponsive for me this morning  Assessment & Plan: Principal Problem Bilateral Thalamic Infarcts Acute metabolic encephalopathy -due to CVA, mental status waxing and waning, at times she is minimally responsive versus talking some.  Neurology following, she has been placed on Provigil, dose increased initially but then had to be decreased due to increased tremulousness.  She was given a trial of amantadine but it was discontinued.  She has a prior history of embolic infarcts.  Imaging on admission showed punctate acute infarction in the left superior cerebellum adjacent to the superior cerebellar peduncle.  Bilateral thalamic acute infarction extending back into the midbrain, consistent with artery of Percheron infarction.  She remains  intermittently encephalopathic.  She had an event with asymmetric pupils on 2/25, had a repeat MRI without any new acute findings and expected evolution of known infarcts.  EEG 2/13 with moderate diffuse encephalopathy -Goals of care discussion yesterday, they wish to proceed with PEG.  IR consulted, recommending to hold Plavix for 5 days and she will have PEG tube placement then.  Last dose of Plavix was Monday 3/7  Active Problems Dysphagia-due to stroke, poorly responsive, on core track.feeding.  Awaiting PEG  Fever -Fever on 2/27, CXR with bibasilar opacities, urine culture showed yeast and completed fluconazole. Blood cultures 1/2 with staph epidermidis (likely contaminated) -Low-grade temp 100.2 3/5 overnight, afebrile since. Closely monitor  Intermittent agitation -waking up from her brain injury and agitated -will liberalize visitation so family can help sit with her  CKD 3A- AKI due to dehydration  Right Sided Hydronephrosis  -Baseline creatinine appears to be 1.0-1.3 -started free water per tube and watching I & O closely to prevent heart failure exacerbation -BUN & Cr continued to rise despite adequate hydration, 2/19>-Cr rose to 2.15- Ordered arenal ultrasound whichshowed right sided hydronephrosis (no mass or definite calcification identified).  Nephrology, urology consulted and she is status post right nephrostomy placement by IR on 2/21. -Creatinine overall stable  UTI  -GBS UTI from 2/12 appears to have been treated with ceftriaxone.  2/27 yeast UTI, completed fluconazole, WBC remains normal this morning   Grade 2 diastolic heart failure-chronic -2D echoperformedduring this admission reveals grade 2 diastolic dysfunction -Appears euvolemic  Gluteal skin breakdown -[place] and barrier cream -will switch to an air mattress to prevent further skin breakdown -turn as able   Diabetes mellitus 2 -  Continue on insulin sliding scale every 4 hours while she is on tube  feeds -basal insulin, bolus insulin - adjust prn -CBGs controlled as below   CBG (last 3)  Recent Labs    04/10/20 2343 04/11/20 0422 04/11/20 0815  GLUCAP 136* 197* 170*   Hypothyroidism -Continue Synthroid per tube  Hyperkalemia -Potassium normal this morning  Scheduled Meds: . amLODipine  5 mg Per Tube Daily  . chlorhexidine  15 mL Mouth Rinse BID  . Chlorhexidine Gluconate Cloth  6 each Topical Daily  . feeding supplement (PROSource TF)  45 mL Per Tube TID  . free water  100 mL Per Tube Q6H  . heparin  5,000 Units Subcutaneous Q8H  . insulin aspart  0-9 Units Subcutaneous Q4H  . insulin aspart  2 Units Subcutaneous Q4H  . insulin detemir  5 Units Subcutaneous Daily  . levothyroxine  112 mcg Per Tube QAC breakfast  . liver oil-zinc oxide   Topical BID  . mouth rinse  15 mL Mouth Rinse q12n4p  . modafinil  200 mg Per Tube Daily  . rosuvastatin  10 mg Per Tube Daily  . sodium chloride flush  3 mL Intravenous Once  . sodium chloride flush  5 mL Intracatheter Q8H   Continuous Infusions: . sodium chloride 10 mL/hr at 03/24/20 0305  . feeding supplement (GLUCERNA 1.2 CAL) 1,000 mL (04/10/20 1234)   PRN Meds:.acetaminophen, docusate, hydroxypropyl methylcellulose / hypromellose, iohexol, polyethylene glycol  Diet Orders (From admission, onward)    Start     Ordered   03/27/20 0001  Diet NPO time specified  Diet effective midnight       Comments: Stop tube feeds at midnight   03/26/20 1237          DVT prophylaxis: heparin injection 5,000 Units Start: 03/28/20 0600 SCDs Start: 03/18/20 1733     Code Status: DNR  Family Communication: No family at bedside this morning  Status is: Inpatient  Remains inpatient appropriate because:Inpatient level of care appropriate due to severity of illness   Dispo: The patient is from: Home              Anticipated d/c is to: Home              Patient currently is not medically stable to d/c.   Difficult to place  patient No  Level of care: Progressive  Consultants:  Neurology  Palliative care Nephrology Urology IR  Procedures:  Right percutaneous nephrostomy 2/21  Microbiology  Urine cultures - yeast   Antimicrobials: Fluconazole 2/27   Objective: Vitals:   04/10/20 1942 04/10/20 2315 04/11/20 0422 04/11/20 0814  BP: (!) 160/65 (!) 151/85 (!) 130/54 130/73  Pulse: 98 94 91 100  Resp: 16 17 16 20   Temp: 98.7 F (37.1 C) 98.6 F (37 C) 98.6 F (37 C) 98.7 F (37.1 C)  TempSrc: Oral Oral Oral Oral  SpO2: 97% 97% 92% 96%  Weight:      Height:        Intake/Output Summary (Last 24 hours) at 04/11/2020 1043 Last data filed at 04/11/2020 0602 Gross per 24 hour  Intake -  Output 1450 ml  Net -1450 ml   Filed Weights   04/04/20 0438 04/05/20 0500 04/08/20 0423  Weight: 81.8 kg 79.5 kg 80 kg    Examination:  Constitutional: Poorly responsive Eyes: No icterus ENMT: mmm Neck: normal, supple Respiratory: Lungs are clear to auscultation bilaterally, no wheezing, no crackles Cardiovascular: Regular rate and rhythm,  no murmurs, no peripheral edema Abdomen: Soft, nontender, nondistended, bowel sounds positive Musculoskeletal: no clubbing / cyanosis.  Skin: No rashes seen Neurologic: Does not follow commands, poorly responsive  Data Reviewed: I have independently reviewed following labs and imaging studies   CBC: Recent Labs  Lab 04/05/20 0322 04/06/20 0336 04/08/20 0232 04/10/20 0311 04/11/20 0407  WBC 5.5 5.6 7.4 7.4 7.1  NEUTROABS 3.3  --   --   --   --   HGB 11.3* 11.2* 11.8* 11.7* 11.5*  HCT 35.0* 34.3* 36.7 35.6* 34.8*  MCV 99.7 99.1 98.9 96.7 96.9  PLT PLATELET CLUMPS NOTED ON SMEAR, UNABLE TO ESTIMATE 317 382 444* 408*   Basic Metabolic Panel: Recent Labs  Lab 04/05/20 0322 04/06/20 0336 04/08/20 0232 04/10/20 0311 04/11/20 0407  NA 139 139 136 136 136  K 4.7 4.4 4.7 4.8 4.9  CL 101 101 97* 96* 96*  CO2 27 27 30 27 28   GLUCOSE 126* 104* 124* 121*  188*  BUN 42* 40* 40* 61* 62*  CREATININE 1.12* 1.08* 0.99 1.45* 1.31*  CALCIUM 9.8 9.8 9.9 10.2 10.3  MG 2.0  --   --   --   --   PHOS 4.0  --   --   --   --    Liver Function Tests: Recent Labs  Lab 04/05/20 0322 04/06/20 0336 04/08/20 0232 04/10/20 0311  AST 19 22 22 23   ALT 12 15 16 15   ALKPHOS 85 85 95 95  BILITOT 0.8 0.5 0.5 0.7  PROT 6.3* 6.2* 6.7 6.5  ALBUMIN 2.6* 2.5* 2.6* 2.7*   Coagulation Profile: No results for input(s): INR, PROTIME in the last 168 hours. HbA1C: No results for input(s): HGBA1C in the last 72 hours. CBG: Recent Labs  Lab 04/10/20 2024 04/10/20 2319 04/10/20 2343 04/11/20 0422 04/11/20 0815  GLUCAP 125* 125* 136* 197* 170*    Recent Results (from the past 240 hour(s))  Culture, blood (routine x 2)     Status: None   Collection Time: 04/03/20  7:55 AM   Specimen: BLOOD RIGHT HAND  Result Value Ref Range Status   Specimen Description BLOOD RIGHT HAND  Final   Special Requests   Final    BOTTLES DRAWN AEROBIC ONLY Blood Culture results may not be optimal due to an inadequate volume of blood received in culture bottles   Culture   Final    NO GROWTH 5 DAYS Performed at [facility], [address], [place], [facility] [address]    Report Status 04/08/2020 FINAL  Final  Culture, blood (routine x 2)     Status: Abnormal   Collection Time: 04/03/20  8:11 AM   Specimen: BLOOD LEFT ARM  Result Value Ref Range Status   Specimen Description BLOOD LEFT ARM  Final   Special Requests   Final    BOTTLES DRAWN AEROBIC AND ANAEROBIC Blood Culture adequate volume   Culture  Setup Time   Final    GRAM POSITIVE COCCI Organism ID to follow CRITICAL RESULT CALLED TO, READ BACK BY AND VERIFIED WITH: J LEDFORD PHARMD 04/04/20 0554 JDW IN BOTH AEROBIC AND ANAEROBIC BOTTLES    Culture (A)  Final    STAPHYLOCOCCUS EPIDERMIDIS THE SIGNIFICANCE OF ISOLATING THIS ORGANISM FROM A SINGLE SET OF BLOOD CULTURES WHEN MULTIPLE SETS ARE DRAWN IS UNCERTAIN.  PLEASE NOTIFY THE MICROBIOLOGY DEPARTMENT WITHIN ONE WEEK IF SPECIATION AND SENSITIVITIES ARE REQUIRED. Performed at [facility], [address] [place], [facility]  [phone]    Report Status 04/07/2020 FINAL  Final  Blood Culture ID Panel (Reflexed)     Status: Abnormal   Collection Time: 04/03/20  8:11 AM  Result Value Ref Range Status   Enterococcus faecalis NOT DETECTED NOT DETECTED Final   Enterococcus Faecium NOT DETECTED NOT DETECTED Final   Listeria monocytogenes NOT DETECTED NOT DETECTED Final   Staphylococcus species DETECTED (A) NOT DETECTED Final    Comment: CRITICAL RESULT CALLED TO, READ BACK BY AND VERIFIED WITH: J LEDFORD PHARMD 04/04/20 0554 JDW    Staphylococcus aureus (BCID) NOT DETECTED NOT DETECTED Final   Staphylococcus epidermidis DETECTED (A) NOT DETECTED Final    Comment: Methicillin (oxacillin) resistant coagulase negative staphylococcus. Possible blood culture contaminant (unless isolated from more than one blood culture draw or clinical case suggests pathogenicity). No antibiotic treatment is indicated for blood  culture contaminants. CRITICAL RESULT CALLED TO, READ BACK BY AND VERIFIED WITH: J LEDFORD [facility] 04/04/20 0554 JDW    Staphylococcus lugdunensis NOT DETECTED NOT DETECTED Final   Streptococcus species NOT DETECTED NOT DETECTED Final   Streptococcus agalactiae NOT DETECTED NOT DETECTED Final   Streptococcus pneumoniae NOT DETECTED NOT DETECTED Final   Streptococcus pyogenes NOT DETECTED NOT DETECTED Final   A.calcoaceticus-baumannii NOT DETECTED NOT DETECTED Final   Bacteroides fragilis NOT DETECTED NOT DETECTED Final   Enterobacterales NOT DETECTED NOT DETECTED Final   Enterobacter cloacae complex NOT DETECTED NOT DETECTED Final   Escherichia coli NOT DETECTED NOT DETECTED Final   Klebsiella aerogenes NOT DETECTED NOT DETECTED Final   Klebsiella oxytoca NOT DETECTED NOT DETECTED Final   Klebsiella pneumoniae NOT DETECTED NOT DETECTED Final    Proteus species NOT DETECTED NOT DETECTED Final   Salmonella species NOT DETECTED NOT DETECTED Final   Serratia marcescens NOT DETECTED NOT DETECTED Final   Haemophilus influenzae NOT DETECTED NOT DETECTED Final   Neisseria meningitidis NOT DETECTED NOT DETECTED Final   Pseudomonas aeruginosa NOT DETECTED NOT DETECTED Final   Stenotrophomonas maltophilia NOT DETECTED NOT DETECTED Final   Candida albicans NOT DETECTED NOT DETECTED Final   Candida auris NOT DETECTED NOT DETECTED Final   Candida glabrata NOT DETECTED NOT DETECTED Final   Candida krusei NOT DETECTED NOT DETECTED Final   Candida parapsilosis NOT DETECTED NOT DETECTED Final   Candida tropicalis NOT DETECTED NOT DETECTED Final   Cryptococcus neoformans/gattii NOT DETECTED NOT DETECTED Final   Methicillin resistance mecA/C DETECTED (A) NOT DETECTED Final    Comment: CRITICAL RESULT CALLED TO, READ BACK BY AND VERIFIED WITHShela Commons: J [facility] 04/04/20 0554 JDW Performed at [facility], [address] [place], [place] [address]   Culture, Urine     Status: None   Collection Time: 04/04/20  4:32 PM   Specimen: Urine, Random  Result Value Ref Range Status   Specimen Description URINE, RANDOM  Final   Special Requests NONE  Final   Culture   Final    NO GROWTH Performed at [facility], [address] [place], [place] [address]    Report Status 04/07/2020 FINAL  Final     Radiology Studies: No results found.  Pamella Pertostin Gherghe, MD, PhD [facility]  Between 7 am - 7 pm I am available, please contact me via Amion or Securechat  Between 7 pm - 7 am I am not available, please contact night coverage MD/APP via Amion

## 2020-04-11 NOTE — TOC Progression Note (Signed)
Transition of Care [facility]) - Progression Note    Patient Details  Name: MYKERIA GARMAN MRN: [id number] Date of Birth: 1932/07/02  Transition of [facility] CM/SW Contact  Baldemar [place], [place] Phone Number: 04/11/2020, 4:08 PM  Clinical Narrative:   CSW received call from patient's daughter-in-law Elnita Maxwell earlier today, explaining that the patient's husband had contacted [facility] and was told that he would have to private pay. CSW explained concern that the patient's Medicare would not cover rehab for very long as she had not been making much progress or consistently following commands, so once the Medicare stops paying then the patient would be considered private pay. CSW explained to [place] the process for applying for Medicaid to cover long term care. CSW asked if patient's husband would be coming to the hospital today, that CSW could meet with him to discuss, but Elnita Maxwell indicated that he called her husband (the son) that he was having shortness of breath and he was on the way to go see him to see if he needed to go to the hospital. CSW provided contact number for Elnita Maxwell to provide to her husband (patient's son) for him to call if his father was OK to discuss SNF placement. [facility] has agreed to accept the patient, would have a bed available. CSW never received another call from the family today. CSW to continue to follow.    Expected Discharge Plan: Skilled Nursing Facility Barriers to Discharge: Continued Medical Work up,Insurance Authorization  Expected Discharge Plan and Services Expected Discharge Plan: Skilled Nursing Facility     Post Acute Care Choice: Skilled Nursing Facility Living arrangements for the past 2 months: Single Family Home                                       Social Determinants of Health (SDOH) Interventions    Readmission Risk Interventions No flowsheet data found.

## 2020-04-11 NOTE — Consult Note (Signed)
Chief Complaint: Patient was seen in consultation today for percutaneous gastric tube placement Chief Complaint  Patient presents with  . Code Stroke   at the request of Dr Wardell Heath   Supervising Physician: Sandi Mariscal  Patient Status: Hannah Fischer - In-pt  History of Present Illness: Hannah Fischer is a 87 y.o. female   HTN; HLD; DM; CVAs Left weakness GERD; PE Admitted 03/18/20 with lethargy New CVA 03/19/20 Continues to wax and wane mentally per chart Dysphasia Deconditioning Has been seen by Palliative Care-- I met with patients husband, Hannah Fischer and his son, Hannah Fischer at bedside.  We discussed how Lady has not shown great improvements over this past week. [name] shares that she does still respond to him in voice. He states that he is not ready to let her go. I asked him if it is time to then consider PEG placement which he states he has thought about and would like to pursue. We reviewed what PEG placement would look like in terms of timing and how Hannah Fischer would then need to go to a facility which could address her complex medical needs  Percutaneous gastric tube was requested per [facility] reviewed and anatomy approved for procedure  LD Plavix 3/7 Now off per [facility] Will plan for 3/14 Monday for procedure   Past Medical History:  Diagnosis Date  . Abnormal nuclear stress test    2005, normal cath Dr. Acie Fredrickson, normal stress test 2012 Dr. Irish Lack  . Anemia    chronic  . Anxiety   . Arthritis    djd  . Arthritis of shoulder region, left, degenerative 12/03/2013  . Blood transfusion 1979   for anemia  . Chest pain    evaluated @ [facility]; had a stress test 2012  . Chronic pain syndrome 04/29/2019  . Chronic right-sided low back pain without sciatica 04/12/2019  . Complication of anesthesia    unable to breathe lying  flat on back  . Diabetes mellitus    type 2  niddm x 25 yrs  . Diabetes mellitus ([place]) 11/15/2011  . Diverticular disease   . DJD (degenerative joint  disease)    lumbar and cervical  . GERD (gastroesophageal reflux disease)   . Hyperlipemia   . Hypertension    on medication  . Hypothyroidism    takes levoxyl  . Irritable bowel syndrome   . 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 Neuromuscular disorder ([place])   . Neuropathy in diabetes ([place])   . Nonspecific abnormal electrocardiogram (ECG) (EKG) 10/15/2013  . Other and unspecified angina pectoris    tests came back NOT heart related  . Pulmonary embolism ([place]) 06-2010   bilateral  . Pyuria 11/15/2011  . Seizures ([place])    once, age 85  . TIA (transient ischemic attack) 11/15/2011    Past Surgical History:  Procedure Laterality Date  . BACK SURGERY    . BLEPHAROPLASTY  2012   rt eye  . CARDIAC CATHETERIZATION     had one 10/2013  . CHOLECYSTECTOMY    . EYE SURGERY  2007   cataract ext/ iol rt eye  . IR NEPHROSTOMY PLACEMENT RIGHT  03/27/2020  . IR US GUIDANCE  03/27/2020  . JOINT REPLACEMENT  2001,2005   both knees  . LEFT HEART CATHETERIZATION WITH CORONARY ANGIOGRAM N/A 10/18/2013   Procedure: LEFT HEART CATHETERIZATION WITH CORONARY ANGIOGRAM;  Surgeon: Jettie Booze, MD;  Location: [facility] CATH LAB;  Service: Cardiovascular;  Laterality: N/A;  .  [place] SURGERY  1990  . REVERSE SHOULDER ARTHROPLASTY Left 12/03/2013   Procedure: LEFT SHOULDER REVERSE ARTHROPLASTY;  Surgeon: Augustin Schooling, MD;  Location: [facility];  Service: Orthopedics;  Laterality: Left;  . SHOULDER HEMI-ARTHROPLASTY  12/25/2010   Procedure: SHOULDER HEMI-ARTHROPLASTY;  Surgeon: Meredith Pel;  Location: [facility];  Service: Orthopedics;  Laterality: Right;    Allergies: Atorvastatin, Ezetimibe-simvastatin, Lovastatin, Metformin hcl, Pravastatin, Codeine, Simvastatin, Sulfa antibiotics, and Sulfites  Medications: Prior to Admission medications   Medication Sig Start Date End Date Taking? Authorizing Provider  amLODipine (NORVASC) 5 MG tablet Take 5 mg by mouth every morning. 12/22/19   Yes [provider]  B Complex Vitamins (VITAMIN B COMPLEX) TABS Take 1 tablet by mouth daily after breakfast.   Yes [provider]  Cholecalciferol (VITAMIN D3) 2000 UNITS TABS Take 2,000 Units by mouth daily.    Yes [provider]  clopidogrel (PLAVIX) 75 MG tablet Take 1 tablet (75 mg total) by mouth daily with breakfast. 11/17/11  Yes Barton Dubois, MD  DULoxetine (CYMBALTA) 30 MG capsule TAKE 1 CAPSULE(30 MG) BY MOUTH DAILY Patient taking differently: Take 30 mg by mouth daily. 08/25/19  Yes Jamse Arn, MD  levothyroxine (SYNTHROID) 112 MCG tablet Take 112 mcg by mouth daily before breakfast.   Yes [provider]  losartan (COZAAR) 100 MG tablet Take 100 mg by mouth daily. 04/29/19  Yes [provider]  nitrofurantoin (MACRODANTIN) 100 MG capsule Take 100 mg by mouth daily. 03/09/20  Yes [provider]  Omega-3 Fatty Acids (FISH OIL) 1000 MG CAPS Take 1,000 mg by mouth 2 (two) times daily.   Yes [provider]  pioglitazone (ACTOS) 45 MG tablet Take 45 mg by mouth daily.   Yes [provider]  propranolol (INDERAL) 40 MG tablet Take 40 mg by mouth daily.  04/13/17  Yes [provider]  traZODone (DESYREL) 50 MG tablet TAKE 1 TABLET BY MOUTH AT BEDTIME Patient taking differently: Take 50 mg by mouth at bedtime. 07/01/17  Yes Patel, Domenick Bookbinder, MD  levothyroxine (SYNTHROID, LEVOTHROID) 100 MCG tablet Take 1 tablet (100 mcg total) by mouth daily. Patient not taking: No sig reported 11/17/11   Barton Dubois, MD  methocarbamol (ROBAXIN) 500 MG tablet Take 1 tablet (500 mg total) by mouth every 8 (eight) hours as needed for muscle spasms. Patient not taking: No sig reported 04/13/19   Jamse Arn, MD  rosuvastatin (CRESTOR) 5 MG tablet Take 1 tablet (5 mg total) by mouth 2 (two) times a week for 30 doses. 11/29/19 03/10/20  Little Ishikawa, MD     Family History  Problem Relation Age of Onset  .  Diabetes Mother   . Heart disease Mother   . Heart disease Sister   . Heart disease Brother     Social History   Socioeconomic History  . Marital status: Married    Spouse name: Not on file  . Number of children: Not on file  . Years of education: Not on file  . Highest education level: Not on file  Occupational History  . Not on file  Tobacco Use  . Smoking status: Never Smoker  . Smokeless tobacco: Never Used  Substance and Sexual Activity  . Alcohol use: No  . Drug use: No  . Sexual activity: Never    Birth control/protection: Post-menopausal  Other Topics Concern  . Not on file  Social History Narrative  . Not on file   Social  Determinants of Health   Financial Resource Strain: Not on file  Food Insecurity: Not on file  Transportation Needs: Not on file  Physical Activity: Not on file  Stress: Not on file  Social Connections: Not on file    Review of Systems: A 12 point ROS discussed and pertinent positives are indicated in the HPI above.  All other systems are negative.   Vital Signs: BP 130/73 (BP Location: Left Arm)   Pulse 100   Temp 98.7 F (37.1 C) (Oral)   Resp 20   Ht 5' 3"  (1.6 m)   Wt 176 lb 5.9 oz (80 kg)   SpO2 96%   BMI 31.24 kg/m   Physical Exam Vitals reviewed.  Constitutional:      Appearance: She is ill-appearing.  Cardiovascular:     Rate and Rhythm: Normal rate and regular rhythm.     Heart sounds: Normal heart sounds.  Pulmonary:     Breath sounds: Normal breath sounds.  Abdominal:     Palpations: Abdomen is soft.  Musculoskeletal:     Comments: Moving Rt side spontaneously  Skin:    General: Skin is warm.  Neurological:     Comments: No response Asleep-- unable to  arouse  Psychiatric:     Comments: Will gain consent from husband     Imaging: CT Code Stroke CTA Head W/WO contrast  Addendum Date: 03/18/2020   ADDENDUM REPORT: 03/18/2020 15:27 ADDENDUM: These results were called by telephone at the time of  interpretation on 03/18/2020 at 3:15 pm to provider PRAMOD SETHI , who verbally acknowledged these results. Electronically Signed   By: Kellie Simmering DO   On: 03/18/2020 15:27   Result Date: 03/18/2020 CLINICAL DATA:  Stroke, follow-up. EXAM: CT ANGIOGRAPHY HEAD AND NECK CT PERFUSION BRAIN TECHNIQUE: Multidetector CT imaging of the head and neck was performed using the standard protocol during bolus administration of intravenous contrast. Multiplanar CT image reconstructions and MIPs were obtained to evaluate the vascular anatomy. Carotid stenosis measurements (when applicable) are obtained utilizing NASCET criteria, using the distal internal carotid diameter as the denominator. Multiphase CT imaging of the brain was performed following IV bolus contrast injection. Subsequent parametric perfusion maps were calculated using RAPID software. CONTRAST:  60m OMNIPAQUE IOHEXOL 350 MG/ML SOLN COMPARISON:  Noncontrast head CT performed earlier today 03/18/2020. MRI/MRA head and MRA neck 11/24/2019. FINDINGS: CTA NECK FINDINGS Significantly limited evaluation due to poor contrast bolus timing and motion degradation. Aortic arch: Atherosclerotic plaque within the visualized aortic arch and proximal major branch vessels of the neck. Within described limitations, no definite hemodynamically significant stenosis of the innominate or proximal subclavian arteries is identified. Right carotid system: CCA and ICA patent within the neck. Moderate to severe calcified plaque within the right carotid bifurcation and proximal ICA. A hemodynamically significant stenosis at this site cannot be excluded due to significant motion degradation at this level. Left carotid system: CCA and ICA patent within the neck. Moderate calcified plaque within the carotid bifurcation and proximal ICA. Apparent stenosis at the origin of the left ICA of up to 60%. Vertebral arteries: Streak and beam hardening artifact precludes evaluation of the proximal  right vertebral artery. Within this limitation, the right vertebral artery appears patent within the neck. However, there is gradually diminishing enhancement within the mid to distal cervical right vertebral artery and the right vertebral artery appears highly stenosed or occluded intracranially. Streak and beam hardening artifact precludes adequate evaluation of the proximal left vertebral artery. Within this limitation,  the left vertebral artery is patent within the neck. Skeleton: No acute bony abnormality or aggressive osseous lesion. Cervical spondylosis Other neck: No neck mass or cervical lymphadenopathy. Upper chest: No consolidation within the imaged lung apices. Review of the MIP images confirms the above findings CTA HEAD FINDINGS Significantly limited evaluation due to suboptimal contrast bolus timing. Anterior circulation: The intracranial internal carotid arteries are patent. Prominent calcified plaque within both vessels with suspected moderate stenosis bilaterally. The M1 middle cerebral arteries are patent. No M2 proximal branch occlusion is identified. Moderate/severe stenosis within a mid M2 left MCA branch vessel (series 13, image 27). As before, the A1 left ACA is poorly delineated, this may be secondary to developmentally small vessel size or high-grade stenosis. The anterior cerebral arteries are otherwise patent. No intracranial aneurysm is identified. Posterior circulation: Paucity of enhancement of the intracranial right vertebral artery. This vessel may be highly stenosed or occluded. The intracranial left vertebral artery is patent. Mild atherosclerotic plaque within this vessel without stenosis. The basilar artery is patent. The posterior cerebral arteries are patent proximally without significant stenosis. Posterior communicating arteries are hypoplastic or absent bilaterally. Venous sinuses: Poorly assessed due to contrast timing. Anatomic variants: As described Review of the MIP  images confirms the above findings CT Brain Perfusion Findings: ASPECTS: 10 CBF (<30%) Volume: 412m Perfusion (Tmax>6.0s) volume: 036mMismatch Volume: 12m11mnfarction Location:None identified These results were called by telephone at the time of interpretation on 03/18/2020 at 3:07 pm to provider PRAMOD SETHI , who verbally acknowledged these results. IMPRESSION: CTA neck: 1. Significantly limited evaluation due to motion degradation and suboptimal contrast timing. 2. The common and internal carotid arteries are patent within the neck. Moderate/severe calcified plaque within the right carotid bifurcation and proximal ICA. Quantification of stenosis at this site is not possible due to the degree of motion degradation. Calcified plaque within the left carotid bifurcation and proximal ICA with estimated up to 60% stenosis of the proximal left ICA. 3. Streak and beam hardening artifact precludes adequate evaluation of the proximal vertebral arteries. Within this limitation, the vertebral arteries are patent within the neck. However, there is gradually diminishing enhancement within the mid to distal cervical right vertebral artery and the intracranial right vertebral artery now appears highly stenosed or occluded (however, this could be artifactual in the setting of suboptimal contrast timing). CTA head: 1. Limited evaluation due to suboptimal contrast timing. 2. Paucity of enhancement within the non dominant intracranial right vertebral artery. This vessel may now be highly stenosed or occluded (however, this could be artifactual in the setting of suboptimal contrast timing). 3. Moderate/severe stenosis within a mid M2 left MCA branch vessel. 4. Elsewhere, no intracranial large vessel occlusion or proximal high-grade arterial stenosis is identified. 5. Prominent calcified plaque within the intracranial ICAs bilaterally with suspected moderate bilateral stenosis. CT perfusion head: The perfusion software identifies no core  infarct. The perfusion software identifies no critically hypoperfused parenchyma utilizing the Tmax>6 seconds threshold. Electronically Signed: By: KylKellie Simmering On: 03/18/2020 15:08   DG Abd 1 View  Result Date: 04/04/2020 CLINICAL DATA:  Enteric catheter placement EXAM: ABDOMEN - 1 VIEW COMPARISON:  03/30/2020, 04/03/2020 FINDINGS: Frontal view of the lower chest and upper abdomen demonstrates stable position of the enteric catheter, tip overlying the gastroesophageal junction. Right-sided nephrostomy tube not appreciably changed. Dense calcification mitral annulus again noted. Bowel gas pattern is unremarkable. IMPRESSION: 1. Enteric catheter tip projecting over the gastroesophageal junction. 2. Stable right nephrostomy tube. Electronically Signed  By: Randa Ngo M.D.   On: 04/04/2020 15:23   DG Abd 1 View  Result Date: 03/30/2020 CLINICAL DATA:  NG placement EXAM: ABDOMEN - 1 VIEW COMPARISON:  None. FINDINGS: Feeding tube tip in the proximal stomach at the GE junction region. Normal bowel gas pattern. Right nephrostomy tube noted. Surgical clips in the gallbladder fossa. Lumbar degenerative change with scoliosis. Atherosclerotic aorta. IMPRESSION: Feeding tube in the proximal stomach near the GE junction. Electronically Signed   By: Franchot Gallo M.D.   On: 03/30/2020 12:58   CT Code Stroke CTA Neck W/WO contrast  Addendum Date: 03/18/2020   ADDENDUM REPORT: 03/18/2020 15:27 ADDENDUM: These results were called by telephone at the time of interpretation on 03/18/2020 at 3:15 pm to provider PRAMOD SETHI , who verbally acknowledged these results. Electronically Signed   By: Kellie Simmering DO   On: 03/18/2020 15:27   Result Date: 03/18/2020 CLINICAL DATA:  Stroke, follow-up. EXAM: CT ANGIOGRAPHY HEAD AND NECK CT PERFUSION BRAIN TECHNIQUE: Multidetector CT imaging of the head and neck was performed using the standard protocol during bolus administration of intravenous contrast. Multiplanar CT image  reconstructions and MIPs were obtained to evaluate the vascular anatomy. Carotid stenosis measurements (when applicable) are obtained utilizing NASCET criteria, using the distal internal carotid diameter as the denominator. Multiphase CT imaging of the brain was performed following IV bolus contrast injection. Subsequent parametric perfusion maps were calculated using RAPID software. CONTRAST:  69m OMNIPAQUE IOHEXOL 350 MG/ML SOLN COMPARISON:  Noncontrast head CT performed earlier today 03/18/2020. MRI/MRA head and MRA neck 11/24/2019. FINDINGS: CTA NECK FINDINGS Significantly limited evaluation due to poor contrast bolus timing and motion degradation. Aortic arch: Atherosclerotic plaque within the visualized aortic arch and proximal major branch vessels of the neck. Within described limitations, no definite hemodynamically significant stenosis of the innominate or proximal subclavian arteries is identified. Right carotid system: CCA and ICA patent within the neck. Moderate to severe calcified plaque within the right carotid bifurcation and proximal ICA. A hemodynamically significant stenosis at this site cannot be excluded due to significant motion degradation at this level. Left carotid system: CCA and ICA patent within the neck. Moderate calcified plaque within the carotid bifurcation and proximal ICA. Apparent stenosis at the origin of the left ICA of up to 60%. Vertebral arteries: Streak and beam hardening artifact precludes evaluation of the proximal right vertebral artery. Within this limitation, the right vertebral artery appears patent within the neck. However, there is gradually diminishing enhancement within the mid to distal cervical right vertebral artery and the right vertebral artery appears highly stenosed or occluded intracranially. Streak and beam hardening artifact precludes adequate evaluation of the proximal left vertebral artery. Within this limitation, the left vertebral artery is patent  within the neck. Skeleton: No acute bony abnormality or aggressive osseous lesion. Cervical spondylosis Other neck: No neck mass or cervical lymphadenopathy. Upper chest: No consolidation within the imaged lung apices. Review of the MIP images confirms the above findings CTA HEAD FINDINGS Significantly limited evaluation due to suboptimal contrast bolus timing. Anterior circulation: The intracranial internal carotid arteries are patent. Prominent calcified plaque within both vessels with suspected moderate stenosis bilaterally. The M1 middle cerebral arteries are patent. No M2 proximal branch occlusion is identified. Moderate/severe stenosis within a mid M2 left MCA branch vessel (series 13, image 27). As before, the A1 left ACA is poorly delineated, this may be secondary to developmentally small vessel size or high-grade stenosis. The anterior cerebral arteries are otherwise patent. No  intracranial aneurysm is identified. Posterior circulation: Paucity of enhancement of the intracranial right vertebral artery. This vessel may be highly stenosed or occluded. The intracranial left vertebral artery is patent. Mild atherosclerotic plaque within this vessel without stenosis. The basilar artery is patent. The posterior cerebral arteries are patent proximally without significant stenosis. Posterior communicating arteries are hypoplastic or absent bilaterally. Venous sinuses: Poorly assessed due to contrast timing. Anatomic variants: As described Review of the MIP images confirms the above findings CT Brain Perfusion Findings: ASPECTS: 10 CBF (<30%) Volume: 26m Perfusion (Tmax>6.0s) volume: 079mMismatch Volume: 30m49mnfarction Location:None identified These results were called by telephone at the time of interpretation on 03/18/2020 at 3:07 pm to provider PRAMOD SETHI , who verbally acknowledged these results. IMPRESSION: CTA neck: 1. Significantly limited evaluation due to motion degradation and suboptimal contrast timing.  2. The common and internal carotid arteries are patent within the neck. Moderate/severe calcified plaque within the right carotid bifurcation and proximal ICA. Quantification of stenosis at this site is not possible due to the degree of motion degradation. Calcified plaque within the left carotid bifurcation and proximal ICA with estimated up to 60% stenosis of the proximal left ICA. 3. Streak and beam hardening artifact precludes adequate evaluation of the proximal vertebral arteries. Within this limitation, the vertebral arteries are patent within the neck. However, there is gradually diminishing enhancement within the mid to distal cervical right vertebral artery and the intracranial right vertebral artery now appears highly stenosed or occluded (however, this could be artifactual in the setting of suboptimal contrast timing). CTA head: 1. Limited evaluation due to suboptimal contrast timing. 2. Paucity of enhancement within the non dominant intracranial right vertebral artery. This vessel may now be highly stenosed or occluded (however, this could be artifactual in the setting of suboptimal contrast timing). 3. Moderate/severe stenosis within a mid M2 left MCA branch vessel. 4. Elsewhere, no intracranial large vessel occlusion or proximal high-grade arterial stenosis is identified. 5. Prominent calcified plaque within the intracranial ICAs bilaterally with suspected moderate bilateral stenosis. CT perfusion head: The perfusion software identifies no core infarct. The perfusion software identifies no critically hypoperfused parenchyma utilizing the Tmax>6 seconds threshold. Electronically Signed: By: KylKellie Simmering On: 03/18/2020 15:08   MR BRAIN WO CONTRAST  Result Date: 03/31/2020 CLINICAL DATA:  Stroke follow-up EXAM: MRI HEAD WITHOUT CONTRAST TECHNIQUE: Diffusion-weighted images obtained axial coronal planes. COMPARISON:  MRI of the brain March 19, 2020 FINDINGS: Mild restricted diffusion within the  bilateral thalami consistent with expected evolution of known infarcts. No other focus of restricted diffusion identified. IMPRESSION: 1. No new acute infarct identified. 2. Mildly restricted diffusion within the bilateral thalami consistent with expected evolution of known infarcts. Electronically Signed   By: KatPedro EarlsD.   On: 03/31/2020 12:07   MR BRAIN WO CONTRAST  Result Date: 03/19/2020 CLINICAL DATA:  Suspected stroke. EXAM: MRI HEAD WITHOUT CONTRAST TECHNIQUE: Multiplanar, multiecho pulse sequences of the brain and surrounding structures were obtained without intravenous contrast. COMPARISON:  CT studies done yesterday. FINDINGS: Brain: Diffusion imaging shows a punctate acute infarction in the left superior cerebellum adjacent to the superior cerebellar peduncle. There is bilateral thalamic acute infarction. This extends back into the mid brain in a symmetric fashion. No other acute infarction. This is consistent with artery Percheron infarction. Elsewhere, mega cisterna magna is noted. There chronic small-vessel ischemic changes of the hemispheric white matter. There is old infarction at the left parietooccipital junction. No evidence of hemorrhage, hydrocephalus  or extra-axial collection. Vascular: Major vessels at the base of the brain show flow. Skull and upper cervical spine: Negative Sinuses/Orbits: Clear/normal Other: None IMPRESSION: 1. Punctate acute infarction in the left superior cerebellum adjacent to the superior cerebellar peduncle. Bilateral thalamic acute infarction extending back into the mid brain. This is consistent with artery of Percheron infarction. No hemorrhage or mass effect. 2. Old infarction at the left parietooccipital junction. 3. Chronic small-vessel ischemic changes elsewhere throughout the brain. Electronically Signed   By: Nelson Chimes M.D.   On: 03/19/2020 02:43   US RENAL  Result Date: 03/27/2020 CLINICAL DATA:  Hydronephrosis, diabetes  mellitus, hypertension, history of renal failure EXAM: RENAL / URINARY TRACT ULTRASOUND COMPLETE COMPARISON:  03/25/2020 renal ultrasound and CT exams FINDINGS: Right Kidney: Renal measurements: 10.5 x 5.3 x 6.2 cm = volume: 181 mL. Cortical thinning. Normal cortical echogenicity. Severe hydronephrosis again identified. No mass or definite calcification identified. Left Kidney: Renal measurements: 9.6 x 4.5 x 4.2 cm = volume: 94 mL. Cortical thinning. Upper normal cortical echogenicity. No mass, hydronephrosis, or shadowing calcification. Bladder: Decompressed by Foley catheter, unable to evaluate. Other: N/A IMPRESSION: Persistent severe RIGHT hydronephrosis. BILATERAL renal cortical thinning. Electronically Signed   By: Lavonia Dana M.D.   On: 03/27/2020 08:17   US RENAL  Result Date: 03/25/2020 CLINICAL DATA:  87 year old female with renal failure. EXAM: RENAL / URINARY TRACT ULTRASOUND COMPLETE COMPARISON:  04/02/2019 CT FINDINGS: Right Kidney: Renal measurements: 9.9 x 5.7 x 5.2 cm = volume: 150 mL. Moderate to severe RIGHT hydronephrosis is identified. A 1.2 cm calculus in the region of the renal pelvis/possible UPJ noted. Renal cortical thinning is noted. Left Kidney: Not visualized Bladder: Ureteral jets are not noted.  Otherwise appears normal. Other: None. IMPRESSION: Moderate to severe RIGHT hydronephrosis with a 1.2 cm calculus in the region of the renal pelvis/possible UPJ. Consider further evaluation with CT to evaluate site of obstruction. LEFT kidney not visualized. These results will be called to the ordering clinician or representative by the Radiologist Assistant, and communication documented in the PACS or [employer]. Electronically Signed   By: Margarette Canada M.D.   On: 03/25/2020 11:45   IR US Guidance  Result Date: 03/27/2020 INDICATION: 87 year old female with right hydronephrosis and azotemia. EXAM: 1. ULTRASOUND GUIDANCE FOR PUNCTURE OF THE RIGHT RENAL COLLECTING SYSTEM 2. RIGHT  PERCUTANEOUS NEPHROSTOMY TUBE PLACEMENT. COMPARISON:  03/25/2020, 03/27/2020 MEDICATIONS: None. ANESTHESIA/SEDATION: Moderate (conscious) sedation was employed during this procedure. A total of Versed 1 mg and Fentanyl 0 mcg was administered intravenously. Moderate Sedation Time: 17 minutes. The patient's level of consciousness and vital signs were monitored continuously by radiology nursing throughout the procedure under my direct supervision. CONTRAST:  10 mL mL Omnipaque 300-administered into the renal collecting system FLUOROSCOPY TIME:  One minutes 30 seconds, 9 mGy COMPLICATIONS: None immediate. PROCEDURE: The procedure, risks, benefits, and alternatives were explained to the patient. Questions regarding the procedure were encouraged and answered. The patient understands and consents to the procedure. A timeout was performed prior to the initiation of the procedure. The right flank region was prepped and draped in the usual sterile fashion and a sterile drape was applied covering the operative field. A sterile gown and sterile gloves were used for the procedure. Local anesthesia was provided with 1% Lidocaine with epinephrine. Ultrasound was used to localize the right kidney. Under direct ultrasound guidance, a 20 gauge needle was advanced into the renal collecting system. An ultrasound image documentation was performed. Access within the  collecting system was confirmed with the efflux of urine followed by limited contrast injection. Over a Nitrex wire, the tract was dilated with an Accustick stent. Next, under intermittent fluoroscopic guidance and over a short Amplatz wire, the track was dilated ultimately allowing placement of a 10.2-French percutaneous nephrostomy catheter which was advanced to the level of the renal pelvis where the coil was formed and locked. Contrast was injected and several spot fluoroscopic images were obtained in various obliquities. The catheter was secured at the skin with a  Prolene retention suture and stat lock device and connected to a gravity bag was placed. Dressings were applied. The patient tolerated procedure well without immediate postprocedural complication. FINDINGS: Ultrasound scanning demonstrates a moderate to severely dilated right collecting system. There is an apparent ureteropelvic junction obstruction. Under a combination of ultrasound and fluoroscopic guidance, a posterior inferior calix was targeted allowing placement of a 10.2 French percutaneous nephrostomy catheter with end coiled and locked within the renal pelvis. Contrast injection confirmed appropriate positioning. IMPRESSION: Successful ultrasound and fluoroscopic guided placement of a right sided 10.2 [place] PCN. Limited nephrostogram demonstrates severe hydronephrosis and ureteropelvic junction obstruction. PLAN: Return in 4 weeks for routine check and exchange pending interim definitive Urological intervention. Ruthann Cancer, MD [facility] Electronically Signed   By: Ruthann Cancer MD   On: 03/27/2020 14:46   CT Code Stroke Cerebral Perfusion with contrast  Addendum Date: 03/18/2020   ADDENDUM REPORT: 03/18/2020 15:27 ADDENDUM: These results were called by telephone at the time of interpretation on 03/18/2020 at 3:15 pm to provider PRAMOD SETHI , who verbally acknowledged these results. Electronically Signed   By: Kellie Simmering DO   On: 03/18/2020 15:27   Result Date: 03/18/2020 CLINICAL DATA:  Stroke, follow-up. EXAM: CT ANGIOGRAPHY HEAD AND NECK CT PERFUSION BRAIN TECHNIQUE: Multidetector CT imaging of the head and neck was performed using the standard protocol during bolus administration of intravenous contrast. Multiplanar CT image reconstructions and MIPs were obtained to evaluate the vascular anatomy. Carotid stenosis measurements (when applicable) are obtained utilizing NASCET criteria, using the distal internal carotid diameter as the  denominator. Multiphase CT imaging of the brain was performed following IV bolus contrast injection. Subsequent parametric perfusion maps were calculated using RAPID software. CONTRAST:  105m OMNIPAQUE IOHEXOL 350 MG/ML SOLN COMPARISON:  Noncontrast head CT performed earlier today 03/18/2020. MRI/MRA head and MRA neck 11/24/2019. FINDINGS: CTA NECK FINDINGS Significantly limited evaluation due to poor contrast bolus timing and motion degradation. Aortic arch: Atherosclerotic plaque within the visualized aortic arch and proximal major branch vessels of the neck. Within described limitations, no definite hemodynamically significant stenosis of the innominate or proximal subclavian arteries is identified. Right carotid system: CCA and ICA patent within the neck. Moderate to severe calcified plaque within the right carotid bifurcation and proximal ICA. A hemodynamically significant stenosis at this site cannot be excluded due to significant motion degradation at this level. Left carotid system: CCA and ICA patent within the neck. Moderate calcified plaque within the carotid bifurcation and proximal ICA. Apparent stenosis at the origin of the left ICA of up to 60%. Vertebral arteries: Streak and beam hardening artifact precludes evaluation of the proximal right vertebral artery. Within this limitation, the right vertebral artery appears patent within the neck. However, there is gradually diminishing enhancement within the mid to distal cervical right vertebral artery and the right vertebral artery appears highly stenosed or occluded intracranially. Streak and beam hardening artifact precludes adequate evaluation of the  proximal left vertebral artery. Within this limitation, the left vertebral artery is patent within the neck. Skeleton: No acute bony abnormality or aggressive osseous lesion. Cervical spondylosis Other neck: No neck mass or cervical lymphadenopathy. Upper chest: No consolidation within the imaged lung  apices. Review of the MIP images confirms the above findings CTA HEAD FINDINGS Significantly limited evaluation due to suboptimal contrast bolus timing. Anterior circulation: The intracranial internal carotid arteries are patent. Prominent calcified plaque within both vessels with suspected moderate stenosis bilaterally. The M1 middle cerebral arteries are patent. No M2 proximal branch occlusion is identified. Moderate/severe stenosis within a mid M2 left MCA branch vessel (series 13, image 27). As before, the A1 left ACA is poorly delineated, this may be secondary to developmentally small vessel size or high-grade stenosis. The anterior cerebral arteries are otherwise patent. No intracranial aneurysm is identified. Posterior circulation: Paucity of enhancement of the intracranial right vertebral artery. This vessel may be highly stenosed or occluded. The intracranial left vertebral artery is patent. Mild atherosclerotic plaque within this vessel without stenosis. The basilar artery is patent. The posterior cerebral arteries are patent proximally without significant stenosis. Posterior communicating arteries are hypoplastic or absent bilaterally. Venous sinuses: Poorly assessed due to contrast timing. Anatomic variants: As described Review of the MIP images confirms the above findings CT Brain Perfusion Findings: ASPECTS: 10 CBF (<30%) Volume: 62m Perfusion (Tmax>6.0s) volume: 049mMismatch Volume: 74m59mnfarction Location:None identified These results were called by telephone at the time of interpretation on 03/18/2020 at 3:07 pm to provider PRAMOD SETHI , who verbally acknowledged these results. IMPRESSION: CTA neck: 1. Significantly limited evaluation due to motion degradation and suboptimal contrast timing. 2. The common and internal carotid arteries are patent within the neck. Moderate/severe calcified plaque within the right carotid bifurcation and proximal ICA. Quantification of stenosis at this site is not  possible due to the degree of motion degradation. Calcified plaque within the left carotid bifurcation and proximal ICA with estimated up to 60% stenosis of the proximal left ICA. 3. Streak and beam hardening artifact precludes adequate evaluation of the proximal vertebral arteries. Within this limitation, the vertebral arteries are patent within the neck. However, there is gradually diminishing enhancement within the mid to distal cervical right vertebral artery and the intracranial right vertebral artery now appears highly stenosed or occluded (however, this could be artifactual in the setting of suboptimal contrast timing). CTA head: 1. Limited evaluation due to suboptimal contrast timing. 2. Paucity of enhancement within the non dominant intracranial right vertebral artery. This vessel may now be highly stenosed or occluded (however, this could be artifactual in the setting of suboptimal contrast timing). 3. Moderate/severe stenosis within a mid M2 left MCA branch vessel. 4. Elsewhere, no intracranial large vessel occlusion or proximal high-grade arterial stenosis is identified. 5. Prominent calcified plaque within the intracranial ICAs bilaterally with suspected moderate bilateral stenosis. CT perfusion head: The perfusion software identifies no core infarct. The perfusion software identifies no critically hypoperfused parenchyma utilizing the Tmax>6 seconds threshold. Electronically Signed: By: KylKellie Simmering On: 03/18/2020 15:08   DG CHEST PORT 1 VIEW  Result Date: 04/09/2020 CLINICAL DATA:  Tachypnea. EXAM: PORTABLE CHEST 1 VIEW COMPARISON:  Radiograph 04/03/2020 FINDINGS: Weighted enteric tube with tip below the diaphragm not included in the field of view. Stable heart size and mediastinal contours. Mitral annulus calcifications. Minor bibasilar atelectasis. No pulmonary edema, large pleural effusion, or pneumothorax. Pigtail catheter in the right upper quadrant. Bilateral shoulder arthroplasties.  IMPRESSION: Minor bibasilar  atelectasis. Electronically Signed   By: Keith Rake M.D.   On: 04/09/2020 20:11   DG CHEST PORT 1 VIEW  Result Date: 04/03/2020 CLINICAL DATA:  Fever EXAM: PORTABLE CHEST 1 VIEW COMPARISON:  03/18/2020 FINDINGS: Feeding tube has been placed with the tip in the stomach. Bibasilar opacities, favor atelectasis. Heart is borderline in size. No effusions or pneumothorax. Bilateral shoulder replacements. No acute bony abnormality. IMPRESSION: Bibasilar opacities, favor atelectasis. Electronically Signed   By: Rolm Baptise M.D.   On: 04/03/2020 08:26   DG Chest Portable 1 View  Result Date: 03/18/2020 CLINICAL DATA:  Unresponsive. EXAM: PORTABLE CHEST 1 VIEW COMPARISON:  01/19/2020 FINDINGS: The cardiac silhouette, mediastinal and hilar contours are within normal limits given the AP projection supine position of the patient. 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 No acute pulmonary findings. The bony thorax is intact.  Bilateral shoulder prostheses are noted. IMPRESSION: No acute cardiopulmonary findings. Electronically Signed   By: Marijo Sanes M.D.   On: 03/18/2020 14:49   DG Abd Portable 1V  Result Date: 04/05/2020 CLINICAL DATA:  Feeding tube placement EXAM: PORTABLE ABDOMEN - 1 VIEW COMPARISON:  Plain film from earlier same day. FINDINGS: Weighted tip feeding tube in place with tip now directed towards the expected region of the stomach pylorus/duodenal bulb. No dilated bowel loops are appreciated. IMPRESSION: Weighted tip feeding tube in place, repositioned compared to today's earlier exam, with tip now directed towards the expected region of the stomach pylorus/duodenal bulb. Electronically Signed   By: Franki Cabot M.D.   On: 04/05/2020 16:56   DG Abd Portable 1V  Result Date: 04/05/2020 CLINICAL DATA:  Feeding tube placement. EXAM: PORTABLE ABDOMEN - 1 VIEW COMPARISON:  Single-view of the abdomen earlier today. FINDINGS: Feeding tube seen on the  prior study has been advanced. The tube is now looped with the tip projecting retrograde. There is a kink at the site of the loop. No other change. IMPRESSION: As above. Electronically Signed   By: Inge Rise M.D.   On: 04/05/2020 13:46   EEG adult  Result Date: 03/19/2020 Lora Havens, MD     03/19/2020  2:12 PM Patient Name: CHAMPAGNE PALETTA MRN: [id number] Epilepsy Attending: Lora Havens Referring Physician/Provider: Dr Shea Evans Date: 03/19/2020 Duration: 24.36 mins Patient history: 87 year old Caucasian lady with sudden onset of unresponsiveness and altered mental status. EEG to evaluate for seizure Level of alertness: lethargic AEDs during EEG study: None Technical aspects: This EEG study was done with scalp electrodes positioned according to the 10-20 International system of electrode placement. Electrical activity was acquired at a sampling rate of 500Hz  and reviewed with a high frequency filter of 70Hz  and a low frequency filter of 1Hz . EEG data were recorded continuously and digitally stored. Description: No posterior dominant rhythm was seen. EEG showed continuous generalized 3 to 6 Hz theta-delta slowing.  Hyperventilation and photic stimulation were not performed.   ABNORMALITY -Continuous slow, generalized IMPRESSION: This study is suggestive of moderate diffuse encephalopathy, nonspecific etiology. No seizures or epileptiform discharges were seen throughout the recording. Lora Havens   MR MRV HEAD W WO CONTRAST  Result Date: 03/21/2020 CLINICAL DATA:  Dural venous sinus thrombosis. EXAM: MR VENOGRAM HEAD WITHOUT AND WITH CONTRAST TECHNIQUE: Angiographic images of the intracranial venous structures were obtained using MRV technique without and with intravenous contrast. CONTRAST:  21m GADAVIST GADOBUTROL 1 MMOL/ML IV SOLN COMPARISON:  None. FINDINGS: The left transverse sinus is mildly hypoplastic. Nonocclusive filling defect in  the bilateral transverse sigmoid junction  (series 600, image 91 and 242), more pronounced on the right. Small round filling defect within the left transverse sinus corresponds to inaccurate noise granulation. (Series 600, image 200). Superior sagittal sinus, internal cerebral veins, vein of Galen, straight sinus and jugular bulbs are patent without evidence of thrombus or stenosis. IMPRESSION: Nonocclusive filling defect in the bilateral transverse sigmoid junction, may represent nonocclusive thrombus. Electronically Signed   By: Pedro Earls M.D.   On: 03/21/2020 13:25   IR NEPHROSTOMY PLACEMENT RIGHT  Result Date: 03/27/2020 INDICATION: 87 year old female with right hydronephrosis and azotemia. EXAM: 1. ULTRASOUND GUIDANCE FOR PUNCTURE OF THE RIGHT RENAL COLLECTING SYSTEM 2. RIGHT PERCUTANEOUS NEPHROSTOMY TUBE PLACEMENT. COMPARISON:  03/25/2020, 03/27/2020 MEDICATIONS: None. ANESTHESIA/SEDATION: Moderate (conscious) sedation was employed during this procedure. A total of Versed 1 mg and Fentanyl 0 mcg was administered intravenously. Moderate Sedation Time: 17 minutes. The patient's level of consciousness and vital signs were monitored continuously by radiology nursing throughout the procedure under my direct supervision. CONTRAST:  10 mL mL Omnipaque 300-administered into the renal collecting system FLUOROSCOPY TIME:  One minutes 30 seconds, 9 mGy COMPLICATIONS: None immediate. PROCEDURE: The procedure, risks, benefits, and alternatives were explained to the patient. Questions regarding the procedure were encouraged and answered. The patient understands and consents to the procedure. A timeout was performed prior to the initiation of the procedure. The right flank region was prepped and draped in the usual sterile fashion and a sterile drape was applied covering the operative field. A sterile gown and sterile gloves were used for the procedure. Local anesthesia was provided with 1% Lidocaine with epinephrine. Ultrasound was used to  localize the right kidney. Under direct ultrasound guidance, a 20 gauge needle was advanced into the renal collecting system. An ultrasound image documentation was performed. Access within the collecting system was confirmed with the efflux of urine followed by limited contrast injection. Over a Nitrex wire, the tract was dilated with an Accustick stent. Next, under intermittent fluoroscopic guidance and over a short Amplatz wire, the track was dilated ultimately allowing placement of a 10.2-French percutaneous nephrostomy catheter which was advanced to the level of the renal pelvis where the coil was formed and locked. Contrast was injected and several spot fluoroscopic images were obtained in various obliquities. The catheter was secured at the skin with a Prolene retention suture and stat lock device and connected to a gravity bag was placed. Dressings were applied. The patient tolerated procedure well without immediate postprocedural complication. FINDINGS: Ultrasound scanning demonstrates a moderate to severely dilated right collecting system. There is an apparent ureteropelvic junction obstruction. Under a combination of ultrasound and fluoroscopic guidance, a posterior inferior calix was targeted allowing placement of a 10.2 French percutaneous nephrostomy catheter with end coiled and locked within the renal pelvis. Contrast injection confirmed appropriate positioning. IMPRESSION: Successful ultrasound and fluoroscopic guided placement of a right sided 10.2 [place] PCN. Limited nephrostogram demonstrates severe hydronephrosis and ureteropelvic junction obstruction. PLAN: Return in 4 weeks for routine check and exchange pending interim definitive Urological intervention. Ruthann Cancer, MD [facility] Electronically Signed   By: Ruthann Cancer MD   On: 03/27/2020 14:46   CT HEAD CODE STROKE WO CONTRAST  Result Date: 03/18/2020 CLINICAL DATA:  Code  stroke. Neuro deficit, acute, stroke suspected. EXAM: CT HEAD WITHOUT CONTRAST TECHNIQUE: Contiguous axial images were obtained from the base of the skull through the vertex without intravenous contrast. COMPARISON:  MRI/MRA head  and MRA neck 11/24/2019. Head CT 11/23/2019. FINDINGS: Brain: Mild-to-moderate cerebral atrophy. 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 Redemonstrated chronic encephalomalacia within the anterior right temporal lobe. Background mild-to-moderate patchy and ill-defined hypoattenuation within the cerebral white matter is nonspecific, but compatible with chronic small vessel ischemic disease. There is no acute intracranial hemorrhage. No acute demarcated cortical infarct. No extra-axial fluid collection. No evidence of intracranial mass. No midline shift. Redemonstrated mega cisterna magna Vascular: No hyperdense vessel.  Atherosclerotic calcifications. Skull: Normal. Negative for fracture or focal lesion. Sinuses/Orbits: Visualized orbits show no acute finding. Trace ethmoid sinus mucosal thickening. ASPECTS ([place] Stroke Program Early CT Score) - Ganglionic level infarction (caudate, lentiform nuclei, internal capsule, insula, M1-M3 cortex): 7 - Supraganglionic infarction (M4-M6 cortex): 3 Total score (0-10 with 10 being normal): 10 These results were called by telephone at the time of interpretation on 03/18/2020 at 2:25 pm to provider [facility] , who verbally acknowledged these results. IMPRESSION: No evidence of acute intracranial abnormality. 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 Redemonstrated chronic encephalomalacia within the anterior right temporal lobe. Stable background mild-to-moderate cerebral atrophy and chronic small vessel ischemic disease. Electronically Signed   By: Kellie Simmering DO   On: 03/18/2020 14:25   CT CHEST ABDOMEN PELVIS WO CONTRAST  Result Date: 03/25/2020 CLINICAL DATA:  Weakness.   Acute kidney injury.  Hydronephrosis. EXAM: CT CHEST, ABDOMEN AND PELVIS WITHOUT CONTRAST TECHNIQUE: Multidetector CT imaging of the chest, abdomen and pelvis was performed following the standard protocol without IV contrast. COMPARISON:  January 19, 2020 FINDINGS: CT CHEST FINDINGS Cardiovascular: The heart size is unremarkable. There are coronary artery calcifications. There are atherosclerotic changes of the thoracic aorta without evidence for an aneurysm. There is a trace pericardial effusion. Mediastinum/Nodes: -- No mediastinal lymphadenopathy. -- No hilar lymphadenopathy. -- No axillary lymphadenopathy. -- No supraclavicular lymphadenopathy. -- Normal thyroid gland where visualized. -the enteric feeding tube terminates near the GE junction. Repositioning is recommended. Lungs/Pleura: There is scarring versus atelectasis at the lung bases. The trachea is unremarkable. There is no pneumothorax or large pleural effusion. Musculoskeletal: No chest wall abnormality. No bony spinal canal stenosis. CT ABDOMEN PELVIS FINDINGS Hepatobiliary: The liver is normal. Status post cholecystectomy.There is no biliary ductal dilation. Pancreas: Normal contours without ductal dilatation. No peripancreatic fluid collection. Spleen: Unremarkable. Adrenals/Urinary Tract: --Adrenal glands: Unremarkable. --Right kidney/ureter: There is severe right-sided hydronephrosis which is new since January 19, 2020. There is a nonobstructing stone in the renal pelvis. The level of obstruction appears to be at the right UPJ. --Left kidney/ureter: There is mild left-sided hydroureteronephrosis without definite evidence for an obstructing stone. There is a punctate nonobstructing stone in the lower pole the left kidney. --Urinary bladder: The urinary bladder is significantly distended despite Foley catheter placement. Stomach/Bowel: --Stomach/Duodenum: No hiatal hernia or other gastric abnormality. Normal duodenal course and caliber. --Small  bowel: Unremarkable. --Colon: Rectosigmoid diverticulosis without acute inflammation. --Appendix: Normal. Vascular/Lymphatic: Atherosclerotic calcification is present within the non-aneurysmal abdominal aorta, without hemodynamically significant stenosis. --No retroperitoneal lymphadenopathy. --No mesenteric lymphadenopathy. --No pelvic or inguinal lymphadenopathy. Reproductive: Unremarkable Other: There is a small volume of free fluid the patient's pelvis. The abdominal wall is normal. Musculoskeletal. Moderate degenerative changes are noted throughout the visualized thoracolumbar spine. There is diffuse osteopenia. No definite acute displaced fracture. IMPRESSION: 1. Severe right-sided hydronephrosis, new since January 19, 2020. 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 There is no obstructing stone visualized on this study. 2. Mild left-sided hydroureteronephrosis without definite  evidence for an obstructing stone. 3. The urinary bladder is significantly distended despite Foley catheter placement. Query catheter malfunction. 4. Enteric feeding tube terminates near the GE junction. Repositioning is recommended. 5. Rectosigmoid diverticulosis without acute inflammation. 6. Small volume of free fluid the patient's pelvis. Aortic Atherosclerosis (ICD10-I70.0). Electronically Signed   By: Constance Holster M.D.   On: 03/25/2020 15:53    Labs:  CBC: Recent Labs    04/06/20 0336 04/08/20 0232 04/10/20 0311 04/11/20 0407  WBC 5.6 7.4 7.4 7.1  HGB 11.2* 11.8* 11.7* 11.5*  HCT 34.3* 36.7 35.6* 34.8*  PLT 317 382 444* 408*    COAGS: Recent Labs    11/23/19 2304 03/18/20 1420 03/27/20 1009  INR 0.9 1.0 1.0  APTT 27 26  --     BMP: Recent Labs    04/06/20 0336 04/08/20 0232 04/10/20 0311 04/11/20 0407  NA 139 136 136 136  K 4.4 4.7 4.8 4.9  CL 101 97* 96* 96*  CO2 27 30 27 28   GLUCOSE 104* 124* 121* 188*  BUN 40* 40* 61* 62*  CALCIUM 9.8 9.9 10.2 10.3  CREATININE  1.08* 0.99 1.45* 1.31*  GFRNONAA 50* 56* 35* 40*    LIVER FUNCTION TESTS: Recent Labs    04/05/20 0322 04/06/20 0336 04/08/20 0232 04/10/20 0311  BILITOT 0.8 0.5 0.5 0.7  AST 19 22 22 23   ALT 12 15 16 15   ALKPHOS 85 85 95 95  PROT 6.3* 6.2* 6.7 6.5  ALBUMIN 2.6* 2.5* 2.6* 2.7*    TUMOR MARKERS: No results for input(s): AFPTM, CEA, CA199, CHROMGRNA in the last 8760 hours.  Assessment and Plan:  Dysphagia Deconditioning Scheduled for percutaneous gastric tube placement LD Plavix 3/7--- plan for procedure 3/14 I will consent with husband   Thank you for this interesting consult.  I greatly enjoyed meeting ARELENE MORONI and look forward to participating in their care.  A copy of this report was sent to the requesting provider on this date.  Electronically Signed: Lavonia Drafts, PA-C 04/11/2020, 10:49 AM   I spent a total of 40 Minutes    in face to face in clinical consultation, greater than 50% of which was counseling/coordinating care for percutaneous G tube placement

## 2020-04-12 DIAGNOSIS — N179 Acute kidney failure, unspecified: Secondary | ICD-10-CM | POA: Diagnosis not present

## 2020-04-12 DIAGNOSIS — I6381 Other cerebral infarction due to occlusion or stenosis of small artery: Secondary | ICD-10-CM | POA: Diagnosis present

## 2020-04-12 DIAGNOSIS — E1165 Type 2 diabetes mellitus with hyperglycemia: Secondary | ICD-10-CM | POA: Insufficient documentation

## 2020-04-12 DIAGNOSIS — B955 Unspecified streptococcus as the cause of diseases classified elsewhere: Secondary | ICD-10-CM | POA: Diagnosis not present

## 2020-04-12 DIAGNOSIS — N390 Urinary tract infection, site not specified: Secondary | ICD-10-CM | POA: Diagnosis not present

## 2020-04-12 DIAGNOSIS — G8194 Hemiplegia, unspecified affecting left nondominant side: Secondary | ICD-10-CM | POA: Diagnosis present

## 2020-04-12 DIAGNOSIS — I10 Essential (primary) hypertension: Secondary | ICD-10-CM | POA: Diagnosis present

## 2020-04-12 DIAGNOSIS — R5381 Other malaise: Secondary | ICD-10-CM | POA: Diagnosis present

## 2020-04-12 DIAGNOSIS — I5032 Chronic diastolic (congestive) heart failure: Secondary | ICD-10-CM | POA: Diagnosis present

## 2020-04-12 DIAGNOSIS — N1330 Unspecified hydronephrosis: Secondary | ICD-10-CM | POA: Diagnosis present

## 2020-04-12 DIAGNOSIS — R1310 Dysphagia, unspecified: Secondary | ICD-10-CM

## 2020-04-12 DIAGNOSIS — N183 Chronic kidney disease, stage 3 unspecified: Secondary | ICD-10-CM | POA: Diagnosis present

## 2020-04-12 DIAGNOSIS — I639 Cerebral infarction, unspecified: Secondary | ICD-10-CM | POA: Diagnosis present

## 2020-04-12 LAB — BASIC METABOLIC PANEL
Anion gap: 13 (ref 5–15)
BUN: 69 mg/dL — ABNORMAL HIGH (ref 8–23)
CO2: 26 mmol/L (ref 22–32)
Calcium: 10.2 mg/dL (ref 8.9–10.3)
Chloride: 100 mmol/L (ref 98–111)
Creatinine, Ser: 1.36 mg/dL — ABNORMAL HIGH (ref 0.44–1.00)
GFR, Estimated: 38 mL/min — ABNORMAL LOW (ref >60)
Glucose, Bld: 140 mg/dL — ABNORMAL HIGH (ref 70–99)
Potassium: 4.6 mmol/L (ref 3.5–5.1)
Sodium: 139 mmol/L (ref 135–145)

## 2020-04-12 LAB — CBC
HCT: 35 % — ABNORMAL LOW (ref 36.0–46.0)
Hemoglobin: 11.6 g/dL — ABNORMAL LOW (ref 12.0–15.0)
MCH: 32.4 pg (ref 26.0–34.0)
MCHC: 33.1 g/dL (ref 30.0–36.0)
MCV: 97.8 fL (ref 80.0–100.0)
Platelets: 389 10*3/uL (ref 150–400)
RBC: 3.58 MIL/uL — ABNORMAL LOW (ref 3.87–5.11)
RDW: 12.9 % (ref 11.5–15.5)
WBC: 6.6 10*3/uL (ref 4.0–10.5)
nRBC: 0 % (ref 0.0–0.2)

## 2020-04-12 LAB — GLUCOSE, CAPILLARY
Glucose-Capillary: 127 mg/dL — ABNORMAL HIGH (ref 70–99)
Glucose-Capillary: 129 mg/dL — ABNORMAL HIGH (ref 70–99)
Glucose-Capillary: 139 mg/dL — ABNORMAL HIGH (ref 70–99)
Glucose-Capillary: 152 mg/dL — ABNORMAL HIGH (ref 70–99)
Glucose-Capillary: 155 mg/dL — ABNORMAL HIGH (ref 70–99)
Glucose-Capillary: 164 mg/dL — ABNORMAL HIGH (ref 70–99)
Glucose-Capillary: 196 mg/dL — ABNORMAL HIGH (ref 70–99)

## 2020-04-12 NOTE — Progress Notes (Signed)
[facility] PROGRESS NOTE  Hannah Fischer WGN:[id number] DOB: May 01, 1932 DOA: 03/18/2020 PCP: Sigmund Hazel, MD  Status:  Remains inpatient appropriate because:Altered mental status, Unsafe d/c plan and Inpatient level of care appropriate due to severity of illness   Dispo: The patient is from: Home              Anticipated d/c is to: SNF              Patient currently is not medically stable to d/c.               Barriers to discharge: Patient very slow to progress with PT and OT and unclear if she is an appropriate rehab candidate for SNF or will need long-term placement.  Had PEG tube placed on 3/8.  Family has confirmed that they do not have the resources to pay for skilled nursing facility services out-of-pocket once insurance benefits exhausted.   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 Shehad gone to bedin the morning her husbandhad trouble waking her up. In the ED she was noted to be responsive to pain. A code stroke was called. She was outside the window for TPA. An emergent CT did not show any acute abnormality. A CTA was performed and did not reveal any obvious stenosis. An MRI of the brain was performed on 2/13:Punctate acute infarction in the left superior cerebellumadjacent to the superior cerebellar peduncle. Bilateral thalamicacute infarction extending back into the mid brain. This is consistent with artery of Percheron infarction.She continues to wax and wane from a mental status standpoint. She has cortrak. Palliative and neuro are following, family eventually decided for PEG which is now pending Plavix  washout  Subjective: Patient essentially did not awaken during exam.  At one point groaned briefly but did not open eyes or make any purposeful movements.  Objective: Vitals:   04/12/20 0033 04/12/20 0429  BP: 134/90 (!) 138/95  Pulse: 86 88  Resp: (!) 24 (!) 22  Temp: 99 F (37.2 C) 98.1 F (36.7 C)  SpO2: 97% 98%    Intake/Output Summary (Last 24 hours) at 04/12/2020 0759 Last data filed at 04/12/2020 0631 Gross per 24 hour  Intake 10 ml  Output 1600 ml  Net -1590 ml   Filed Weights   04/04/20 0438 04/05/20 0500 04/08/20 0423  Weight: 81.8 kg 79.5 kg 80 kg    Exam:  Constitutional: NAD, lethargic, calm, comfortable Respiratory: clear to auscultation bilaterally, no wheezing, no crackles. Normal respiratory effort. No accessory muscle use.  Stable on room air Cardiovascular: Regular rate and rhythm, no murmurs / rubs / gallops. No extremity edema.  Abdomen: Cortrak in place for continuous tube feedings.  No tenderness, no masses palpated. Bowel sounds positive. LBM 3/7 Genitourinary: Right nephrostomy tube with scant output Skin: no rashes, lesions, ulcers. No induration.  Scars on bilateral knees consistent with prior surgery Neurologic: Unable to awaken patient today regarding meaningful interaction.  She did groan without any definitive purposeful movement during my evaluation.  Right pupil nonreactive secondary to apparent prior cataract surgery, left pupil 3 mm and briskly reacts.  Apparently cannot sit at edge of bed with maximum assist when fully awake but mentation has  waxed and waned with primary mentation of lethargy.  Able to accurately assess strength Babinski's are normal. Psychiatric: Lethargic.  Unable to accurately assess orientation since not awake, noninteractive and nonverbal.   Assessment/Plan: Acute problems: Bilateral Thalamic Infarcts Persistent acute metabolic encephalopathy  -due to CVA, mental status waxing and waning, at times she is minimally  responsive versus talking some.  Neurology following -Initially started on Provigil with gradual up titration but unfortunately developed tremulousness and dose was discontinued.   -She had also been given a trial of amantadine but this was discontinued.   -She has a prior history of embolic infarcts with imaging on admission showed punctate acute infarction in the left superior cerebellum adjacent to the superior cerebellar peduncle.  Bilateral thalamic acute infarction extending back into the midbrain, consistent with artery of Percheron infarction.    -She had an event with asymmetric pupils on 2/25, had a repeat MRI without any new acute findings and expected evolution of known infarcts.   EEG 2/13 with moderate diffuse encephalopathy no apparent underlying seizure activity -Continue statin, Plavix on hold for anticipated PEG tube placement  Goals of care discussion  -with palliative team x2 last interaction on 3/7.  At that time despite being made aware of patient's poor status and poor prognosis her husband reported to the palliative provider that he was just not ready to let her go.  Her husband did wish for PEG placement now tentatively scheduled for 3/14 after washout of Plavix -Husband is agreeable to outpatient palliative provider follow-up in the event his wife further declines and would need referral to hospice care  Dysphagia/Nutrition Nutrition Status: Nutrition Problem: Inadequate oral intake Etiology: inability to eat Signs/Symptoms: NPO status Interventions: Tube feeding,Prostat Estimated body mass index is 31.24 kg/m as calculated from the following:   Height as of this encounter:  (1.6 m).   Weight as of this encounter: 80 kg. -Remains lethargic with most recent SLP evaluation on 3 7 recommended continued n.p.o. status-continue cortrack for tube feedings - Awaiting PEG 3/14  CKD 3A- AKI due to dehydration  Right Sided Hydronephrosis  -Baseline creatinine ~  1.0-1.3 -Continue free water per tube and watching I & O closely to prevent heart failure exacerbation -Earlier in the hospitalization due to steady rise of creatinine arenal ultrasound completed whichshowed right sided hydronephrosis(no mass or definite calcification identified).  Nephrology, urology consulted and she is status post right nephrostomy tube placement by IR on 2/21.  -Creatinine overall stable with most recent reading on 3/9 of 1.36 with a BUN of 69  Diabetes mellitus 2 on current long-term insulin with hyperglycemia -Continue on insulin sliding scale every 4 hours while she is on tube feeds -basal insulin, bolus insulin - adjust prn -CBGs have been between 110 and 164 over the past 24 hours -HgbA1c = 7.6  Hypertension/chronic grade 2 diastolic heart failure -2D echoperformedduring this admission reveals grade 2 diastolic dysfunction -Remains euvolemic although overall net INO less than 10,000 cc since admission -Continue Norvasc  Physical deconditioning/residual left hemiplegia from prior stroke PT eval 3/7:    Pt progressing very slowly towards physical therapy goals. Pt sat EOB ~18 minutes with +2 assist. Therapist in front facilitating functional activity with tech posteriorly providing truncal support. Therapist facilitated pt wiping eyes and mouth with a washcloth. Once washcloth against skin, pt initiating wiping motion. Pt also maintaining a propped position laterally on both sides with assist to get into position. Son and husband present throughout session and attempting to communicate  with pt, however with no eye opening or obvious response. Pt appeared to attempt a smile occasionally to son's voice but was very faint. Of note, pt holding B hands in a fist within mitts. It appeared that fingernails were digging into palms. Washcloths were rolled and placed into hands within mitts to avoid a pressure injury. Occupational therapist notified to assess next session.  Will continue to follow and progress as able per POC.      Other problems: Group B strep UTI/recent fevers -GBS UTI from 2/12 appears to have been treated with ceftriaxone. 2/27 yeast UTI, completed fluconazole, WBC remains normal this morning  -Fever on 2/27, CXR with bibasilar opacities, urine culture showed yeast and completed fluconazole. Blood cultures1/2 with staph epidermidis (likely contaminated) -Low-grade temp 100.2 as of 3/5 overnight, afebrile since. Closely monitor  Intermittent agitation  -Has had issues with agitation when awake but for the most part remains lethargic -Continue liberalized visitation so family can sit with her  Gluteal skin breakdown -[place] and barrier cream -Continue air mattress to prevent further skin breakdown -turn as able Wound / Incision (Open or Dehisced) 03/27/20 Puncture Flank Right;Posterior nephrostomy tube placement insertion site (Active)  Date First Assessed/Time First Assessed: 03/27/20 1406   Wound Type: Puncture  Location: Flank  Location Orientation: Right;Posterior  Wound Description (Comments): nephrostomy tube placement insertion site  Present on Admission: No    Assessments 03/27/2020  8:09 AM 04/11/2020  9:30 PM  Dressing Type Gauze (Comment) Gauze (Comment)  Dressing Changed New --  Dressing Status Clean;Dry;Intact Clean;Dry;Intact  Site / Wound Assessment Clean;Dry --     No Linked orders to display    Hypothyroidism -Continue Synthroid per tube -TSH 6.651 on 2/12 and likely an acute phase reactant consistent with sick euthyroid syndrome -TSH with T3 and T4 will need to be repeated in May 2022  Hyperkalemia Resolved -Potassium normal   Data Reviewed: Basic Metabolic Panel: Recent Labs  Lab 04/06/20 0336 04/08/20 0232 04/10/20 0311 04/11/20 0407 04/12/20 0350  NA 139 136 136 136 139  K 4.4 4.7 4.8 4.9 4.6  CL 101 97* 96* 96* 100  CO2 27 30 27 28 26   GLUCOSE 104* 124* 121* 188* 140*  BUN 40* 40* 61* 62*  69*  CREATININE 1.08* 0.99 1.45* 1.31* 1.36*  CALCIUM 9.8 9.9 10.2 10.3 10.2   Liver Function Tests: Recent Labs  Lab 04/06/20 0336 04/08/20 0232 04/10/20 0311  AST 22 22 23   ALT 15 16 15   ALKPHOS 85 95 95  BILITOT 0.5 0.5 0.7  PROT 6.2* 6.7 6.5  ALBUMIN 2.5* 2.6* 2.7*   No results for input(s): LIPASE, AMYLASE in the last 168 hours. No results for input(s): AMMONIA in the last 168 hours. CBC: Recent Labs  Lab 04/06/20 0336 04/08/20 0232 04/10/20 0311 04/11/20 0407 04/12/20 0350  WBC 5.6 7.4 7.4 7.1 6.6  HGB 11.2* 11.8* 11.7* 11.5* 11.6*  HCT 34.3* 36.7 35.6* 34.8* 35.0*  MCV 99.1 98.9 96.7 96.9 97.8  PLT 317 382 444* 408* 389   Cardiac Enzymes: No results for input(s): CKTOTAL, CKMB, CKMBINDEX, TROPONINI in the last 168 hours. BNP (last 3 results) No results for input(s): BNP in the last 8760 hours.  ProBNP (last 3 results) No results for input(s): PROBNP in the last 8760 hours.  CBG: Recent Labs  Lab 04/11/20 1153 04/11/20 1555 04/11/20 2022 04/12/20 0032 04/12/20 0429  GLUCAP 214* 161* 110* 164* 155*    Recent Results (from the past 240 hour(s))  Culture,  blood (routine x 2)     Status: None   Collection Time: 04/03/20  7:55 AM   Specimen: BLOOD RIGHT HAND  Result Value Ref Range Status   Specimen Description BLOOD RIGHT HAND  Final   Special Requests   Final    BOTTLES DRAWN AEROBIC ONLY Blood Culture results may not be optimal due to an inadequate volume of blood received in culture bottles   Culture   Final    NO GROWTH 5 DAYS Performed at [facility], [address] [place], [place] [address]    Report Status 04/08/2020 FINAL  Final  Culture, blood (routine x 2)     Status: Abnormal   Collection Time: 04/03/20  8:11 AM   Specimen: BLOOD LEFT ARM  Result Value Ref Range Status   Specimen Description BLOOD LEFT ARM  Final   Special Requests   Final    BOTTLES DRAWN AEROBIC AND ANAEROBIC Blood Culture adequate volume   Culture  Setup  Time   Final    GRAM POSITIVE COCCI Organism ID to follow CRITICAL RESULT CALLED TO, READ BACK BY AND VERIFIED WITH: J LEDFORD PHARMD 04/04/20 0554 JDW IN BOTH AEROBIC AND ANAEROBIC BOTTLES    Culture (A)  Final    STAPHYLOCOCCUS EPIDERMIDIS THE SIGNIFICANCE OF ISOLATING THIS ORGANISM FROM A SINGLE SET OF BLOOD CULTURES WHEN MULTIPLE SETS ARE DRAWN IS UNCERTAIN. PLEASE NOTIFY THE MICROBIOLOGY DEPARTMENT WITHIN ONE WEEK IF SPECIATION AND SENSITIVITIES ARE REQUIRED. Performed at [facility], [address], [place], [place] [address]    Report Status 04/07/2020 FINAL  Final  Blood Culture ID Panel (Reflexed)     Status: Abnormal   Collection Time: 04/03/20  8:11 AM  Result Value Ref Range Status   Enterococcus faecalis NOT DETECTED NOT DETECTED Final   Enterococcus Faecium NOT DETECTED NOT DETECTED Final   Listeria monocytogenes NOT DETECTED NOT DETECTED Final   Staphylococcus species DETECTED (A) NOT DETECTED Final    Comment: CRITICAL RESULT CALLED TO, READ BACK BY AND VERIFIED WITH: J LEDFORD PHARMD 04/04/20 0554 JDW    Staphylococcus aureus (BCID) NOT DETECTED NOT DETECTED Final   Staphylococcus epidermidis DETECTED (A) NOT DETECTED Final    Comment: Methicillin (oxacillin) resistant coagulase negative staphylococcus. Possible blood culture contaminant (unless isolated from more than one blood culture draw or clinical case suggests pathogenicity). No antibiotic treatment is indicated for blood  culture contaminants. CRITICAL RESULT CALLED TO, READ BACK BY AND VERIFIED WITH: J LEDFORD [facility] 04/04/20 0554 JDW    Staphylococcus lugdunensis NOT DETECTED NOT DETECTED Final   Streptococcus species NOT DETECTED NOT DETECTED Final   Streptococcus agalactiae NOT DETECTED NOT DETECTED Final   Streptococcus pneumoniae NOT DETECTED NOT DETECTED Final   Streptococcus pyogenes NOT DETECTED NOT DETECTED Final   A.calcoaceticus-baumannii NOT DETECTED NOT DETECTED Final   Bacteroides fragilis  NOT DETECTED NOT DETECTED Final   Enterobacterales NOT DETECTED NOT DETECTED Final   Enterobacter cloacae complex NOT DETECTED NOT DETECTED Final   Escherichia coli NOT DETECTED NOT DETECTED Final   Klebsiella aerogenes NOT DETECTED NOT DETECTED Final   Klebsiella oxytoca NOT DETECTED NOT DETECTED Final   Klebsiella pneumoniae NOT DETECTED NOT DETECTED Final   Proteus species NOT DETECTED NOT DETECTED Final   Salmonella species NOT DETECTED NOT DETECTED Final   Serratia marcescens NOT DETECTED NOT DETECTED Final   Haemophilus influenzae NOT DETECTED NOT DETECTED Final   Neisseria meningitidis NOT DETECTED NOT DETECTED Final   Pseudomonas aeruginosa NOT  DETECTED NOT DETECTED Final   Stenotrophomonas maltophilia NOT DETECTED NOT DETECTED Final   Candida albicans NOT DETECTED NOT DETECTED Final   Candida auris NOT DETECTED NOT DETECTED Final   Candida glabrata NOT DETECTED NOT DETECTED Final   Candida krusei NOT DETECTED NOT DETECTED Final   Candida parapsilosis NOT DETECTED NOT DETECTED Final   Candida tropicalis NOT DETECTED NOT DETECTED Final   Cryptococcus neoformans/gattii NOT DETECTED NOT DETECTED Final   Methicillin resistance mecA/C DETECTED (A) NOT DETECTED Final    Comment: CRITICAL RESULT CALLED TO, READ BACK BY AND VERIFIED WITHShela Commons [facility] 04/04/20 0554 JDW Performed at [facility], [address] [place], [place] [address]   Culture, Urine     Status: None   Collection Time: 04/04/20  4:32 PM   Specimen: Urine, Random  Result Value Ref Range Status   Specimen Description URINE, RANDOM  Final   Special Requests NONE  Final   Culture   Final    NO GROWTH Performed at [facility], [address], [place], [place] [address]    Report Status 04/07/2020 FINAL  Final     Studies: No results found.  Scheduled Meds: . amLODipine  5 mg Per Tube Daily  . chlorhexidine  15 mL Mouth Rinse BID  . Chlorhexidine Gluconate Cloth  6 each Topical Daily  .  feeding supplement (PROSource TF)  45 mL Per Tube TID  . free water  100 mL Per Tube Q6H  . heparin  5,000 Units Subcutaneous Q8H  . insulin aspart  0-9 Units Subcutaneous Q4H  . insulin aspart  2 Units Subcutaneous Q4H  . insulin detemir  5 Units Subcutaneous Daily  . levothyroxine  112 mcg Per Tube QAC breakfast  . liver oil-zinc oxide   Topical BID  . mouth rinse  15 mL Mouth Rinse q12n4p  . modafinil  200 mg Per Tube Daily  . rosuvastatin  10 mg Per Tube Daily  . sodium chloride flush  3 mL Intravenous Once  . sodium chloride flush  5 mL Intracatheter Q8H   Continuous Infusions: . sodium chloride 10 mL/hr at 03/24/20 0305  . feeding supplement (GLUCERNA 1.2 CAL) 1,000 mL (04/11/20 1311)    Principal Problem:   Acute encephalopathy Active Problems:   Diabetes mellitus (HCC)   Hypothyroidism   History of embolic stroke   Acute bilateral thalamic infarction (HCC)   Dysphagia   CKD (chronic kidney disease), symptom management only, stage 3 (moderate) (HCC)   Hydronephrosis, right   Group B streptococcal UTI   Uncontrolled type 2 diabetes mellitus with hyperglycemia, with long-term current use of insulin (HCC)   Chronic diastolic heart failure (HCC)   Essential hypertension   Physical debility   Left hemiplegia [facility])   Consultants: Neurology  Palliative care Nephrology Urology IR  Procedures: Right percutaneous nephrostomy 2/21  Antibiotics: Anti-infectives (From admission, onward)   Start     Dose/Rate Route Frequency Ordered Stop   04/07/20 2000  fluconazole (DIFLUCAN) 10 MG/ML suspension 100 mg        100 mg Per Tube Every evening 04/07/20 1236 04/08/20 2023   04/03/20 2000  fluconazole (DIFLUCAN) 40 MG/ML suspension 100 mg  Status:  Discontinued       "Followed by" Linked Group Details   100 mg Per Tube Every 24 hours 04/02/20 1752 04/07/20 1236   04/02/20 1900  fluconazole (DIFLUCAN) 40 MG/ML suspension 200 mg       "Followed by" Linked  Group Details   200  mg Per Tube  Once 04/02/20 1752 04/02/20 2020   03/26/20 0730  cefTRIAXone (ROCEPHIN) 1 g in sodium chloride 0.9 % 100 mL IVPB        1 g 200 mL/hr over 30 Minutes Intravenous Every 24 hours 03/26/20 0644 03/30/20 0844   03/18/20 1900  azithromycin (ZITHROMAX) 500 mg in sodium chloride 0.9 % 250 mL IVPB  Status:  Discontinued        500 mg 250 mL/hr over 60 Minutes Intravenous Every 24 hours 03/18/20 1830 03/19/20 1106   03/18/20 1900  cefTRIAXone (ROCEPHIN) 2 g in sodium chloride 0.9 % 100 mL IVPB  Status:  Discontinued        2 g 200 mL/hr over 30 Minutes Intravenous Every 24 hours 03/18/20 1830 03/19/20 1106        Time spent: 30 minutes    Junious Silk ANP  [facility] 7 am - 330 pm/M-F for direct patient care and secure chat Please refer to Amion for contact info 25  days

## 2020-04-12 NOTE — Progress Notes (Signed)
Occupational Therapy Treatment   04/12/20 1500  OT Visit Information  Last OT Received On 04/12/20  Assistance Needed +2  History of Present Illness Pt is an 87 y/o female admitted 03/18/20 with CVA. Imaging (+) for bilateral medial thalamic and punctate infarcts and L superior cerebellar infarcts. PMH significant for multiple CVAs with most recent ~3 months ago, residual L-sided hemiplegia with recent d/c from outpatient PT/OT with return to baseline level of function, TIA, DM, PE, HTN.  Precautions  Precautions Fall  Precaution Comments cortrak, L mitten restraint, nephrostomy drain, at risk for skin breakdown  Pain Assessment  Pain Assessment Faces  Faces Pain Scale 2  Pain Location w/ PROM at L hand and cervical neck  Pain Descriptors / Indicators Grimacing  Pain Intervention(s) Limited activity within patient's tolerance  Cognition  General Comments Level of arousal remains unchanged. Patient continues to keep eyes closed throughout session. Does not attempt to assist therapist with repositioning in supine. Patient does appear to resist movement in RUE this date.  General Comments  General comments (skin integrity, edema, etc.) Patient able to tolerate R resting hand splint for 4 hours without signs/symptoms of skin breakdown. Patient also tolerating L palm guard with no evident sign/symptoms. Son and husband present at bedside upon entry. Education on purpose of R/L palm guard and R resting hand splint to decrease risk of contractures and maintain skin integrity. Patient and husband expressed verbal understanding. Will continue nursing/family education on proper wear and schedule for splint and palm guards. Will trial R resting hand splint for 6 hours at time of next treatment session.  OT - End of Session  Activity Tolerance Patient limited by lethargy  Patient left in bed;with call bell/phone within reach;with family/visitor present;with restraints reapplied  Nurse Communication Other  (comment) (L palm guard and R resting hand splint in place. Will check splint again in 4 hours.)  OT Assessment/Plan  OT Plan Discharge plan remains appropriate;Frequency remains appropriate  OT Visit Diagnosis Unsteadiness on feet (R26.81);Other abnormalities of gait and mobility (R26.89);Muscle weakness (generalized) (M62.81);Hemiplegia and hemiparesis;Other symptoms and signs involving cognitive function;Pain  Hemiplegia - Right/Left Right  Hemiplegia - dominant/non-dominant Dominant  Hemiplegia - caused by Cerebral infarction  Pain - part of body  (generalized)  OT Frequency (ACUTE ONLY) Min 1X/week  Follow Up Recommendations SNF  OT Equipment Hospital bed  AM-PAC OT "6 Clicks" Daily Activity Outcome Measure (Version 2)  Help from another person eating meals? 1  Help from another person taking care of personal grooming? 1  Help from another person toileting, which includes using toliet, bedpan, or urinal? 1  Help from another person bathing (including washing, rinsing, drying)? 1  Help from another person to put on and taking off regular upper body clothing? 1  Help from another person to put on and taking off regular lower body clothing? 1  6 Click Score 6  Acute Rehab OT Goals  Patient Stated Goal per husband for his wife to get better  OT Goal Formulation With family  Time For Goal Achievement 04/18/20  Potential to Achieve Goals Fair  ADL Goals  Additional ADL Goal #1 Patient will keep eyes open for 50% of OT treatment session demonstrating increased alertness and level of arousal.  Additional ADL Goal #2 Patient will maintain static sitting balance at EOB with no more than Min A in prep for seated ADLs.  Additional ADL Goal #3 Patient will attend to 1 grooming task >1-3 min with Mod cues and hand over  hand assist.  Additional ADL Goal #4 RN/family will don/doff R resting hand splint and R/L palm guards correctly and recall established wearing schedule with I.  OT Time  Calculation  OT Start Time (ACUTE ONLY) 1520  OT Stop Time (ACUTE ONLY) 1530  OT Time Calculation (min) 10 min  OT General Charges  $OT Visit 1 Visit  OT Treatments  $Orthotics/Prosthetics Check 8-22 mins

## 2020-04-12 NOTE — Progress Notes (Signed)
    Risks and benefits image guided gastrostomy tube placement was discussed with the patient's husband [name] via phone including, but not limited to the need for a barium enema during the procedure, bleeding, infection, peritonitis and/or damage to adjacent structures.  All questions were answered, he is agreeable to proceed. Consent signed and in chart.   Procedure planned for 3/14 LD Plavix 3/7  Husband is aware

## 2020-04-12 NOTE — Plan of Care (Signed)

## 2020-04-12 NOTE — Progress Notes (Addendum)
Occupational Therapy Treatment   04/12/20 1100  OT Visit Information  Last OT Received On 04/12/20  Assistance Needed +2  History of Present Illness Pt is an 87 y/o female admitted 03/18/20 with CVA. Imaging (+) for bilateral medial thalamic and punctate infarcts and L superior cerebellar infarcts. PMH significant for multiple CVAs with most recent ~3 months ago, residual L-sided hemiplegia with recent d/c from outpatient PT/OT with return to baseline level of function, TIA, DM, PE, HTN.  Precautions  Precautions Fall  Precaution Comments cortrak, L mitten restraint, nephrostomy drain, at risk for skin breakdown  Pain Assessment  Pain Assessment Faces  Faces Pain Scale 2  Pain Location w/ PROM at L hand and cervical neck  Pain Descriptors / Indicators Grimacing  Pain Intervention(s) Limited activity within patient's tolerance;Monitored during session;Repositioned  Cognition  General Comments Level of arousal remains unchanged. Patient continues to keep eyes closed throughout session. Does not attempt to assist therapist with repositioning in supine. Patient does appear to resist movement in RUE this date.  General Comments  General comments (skin integrity, edema, etc.) Patient with continued decreased level of arousal similar to initial evaluation. No significant difference noted. Hand hygiene performed bilaterally prior to donning L palm guard and R resting hand splint. Will return in 4 hours for splint check. No family present at bedside.  OT - End of Session  Activity Tolerance Patient limited by lethargy  Patient left in bed;with call bell/phone within reach;with family/visitor present;with restraints reapplied  Nurse Communication Other (comment) (L palm guard and R resting hand splint in place. Will check splint again in 4 hours.)  OT Assessment/Plan  OT Plan Discharge plan remains appropriate;Frequency remains appropriate  OT Visit Diagnosis Unsteadiness on feet (R26.81);Other  abnormalities of gait and mobility (R26.89);Muscle weakness (generalized) (M62.81);Hemiplegia and hemiparesis;Other symptoms and signs involving cognitive function;Pain  Hemiplegia - Right/Left Right  Hemiplegia - dominant/non-dominant Dominant  Hemiplegia - caused by Cerebral infarction  Pain - part of body  (generalized)  OT Frequency (ACUTE ONLY) Min 1X/week  Follow Up Recommendations SNF  OT Equipment Hospital bed  AM-PAC OT "6 Clicks" Daily Activity Outcome Measure (Version 2)  Help from another person eating meals? 1  Help from another person taking care of personal grooming? 1  Help from another person toileting, which includes using toliet, bedpan, or urinal? 1  Help from another person bathing (including washing, rinsing, drying)? 1  Help from another person to put on and taking off regular upper body clothing? 1  Help from another person to put on and taking off regular lower body clothing? 1  6 Click Score 6  OT Goal Progression  Progress towards OT goals Progressing toward goals (Splinting and nursing/family education goals added.)  Acute Rehab OT Goals  Patient Stated Goal per husband for his wife to get better  OT Goal Formulation With family  Time For Goal Achievement 04/18/20  Potential to Achieve Goals Fair  OT Time Calculation  OT Start Time (ACUTE ONLY) 1110  OT Stop Time (ACUTE ONLY) 1129  OT Time Calculation (min) 19 min  OT General Charges  $OT Visit 1 Visit  OT Treatments  $Therapeutic Activity 8-22 mins

## 2020-04-12 NOTE — Progress Notes (Signed)
CSW spoke with Meryle Ready at [facility] who states she has spoken with patient's husband and daughter in Development worker, international aid. Meryle Ready states the family reported not having the financial resources to private pay for SNF and are hoping [employer] covers her SNF stay. CSW informed Meryle Ready of plan for PEG placement on 3/14 - CSW to continue following for discharge planning.  Edwin Dada, MSW, LCSW Transitions of Care  Clinical Social Worker II [phone]

## 2020-04-12 NOTE — Progress Notes (Signed)
Physical Therapy Treatment Patient Details Name: Hannah Fischer MRN: [id number] DOB: February 07, 1932 Today's Date: 04/12/2020    History of Present Illness Pt is an 87 y/o female admitted 03/18/20 with CVA. Imaging (+) for bilateral medial thalamic and punctate infarcts and L superior cerebellar infarcts. PMH significant for multiple CVAs with most recent ~3 months ago, residual L-sided hemiplegia with recent d/c from outpatient PT/OT with return to baseline level of function, TIA, DM, PE, HTN.    PT Comments    Pt making very slow progress towards physical therapy goals. Pt did not open eyes at all during session today. Responsive to noxious stimuli and resisting movement in x4 extremities. Noted ankle PROM is limited in DF bilaterally and pt would benefit from PRAFO's. Will continue to follow.    Follow Up Recommendations  SNF;Supervision/Assistance - 24 hour     Equipment Recommendations  Wheelchair cushion (measurements PT);Wheelchair (measurements PT);Hospital bed    Recommendations for Other Services       Precautions / Restrictions Precautions Precautions: Fall Precaution Comments: cortrak, L mitten restraint, nephrostomy drain, at risk for skin breakdown Restrictions Weight Bearing Restrictions: No    Mobility  Bed Mobility                    Transfers                    Ambulation/Gait                 Stairs             Wheelchair Mobility    Modified Rankin (Stroke Patients Only) Modified Rankin (Stroke Patients Only) Pre-Morbid Rankin Score: No significant disability Modified Rankin: Severe disability     Balance       Sitting balance - Comments: Not tested this date                                    Cognition                                       General Comments: Level of arousal remains unchanged. Patient continues to keep eyes closed throughout session. Does not attempt to assist  therapist with repositioning in supine. Patient does appear to resist movement in all 4 extremities this session.      Exercises General Exercises - Upper Extremity Shoulder Flexion: PROM;10 reps;Supine;Right;Left Shoulder Horizontal ABduction: PROM;10 reps;Right;Left Shoulder Horizontal ADduction: PROM;10 reps;Right;Left Elbow Flexion: PROM;Left;Right;10 reps;Supine Elbow Extension: PROM;Right;Left;10 reps;Supine General Exercises - Lower Extremity Ankle Circles/Pumps: PROM;10 reps;Right;Left Heel Slides: PROM;10 reps;Right;Left Hip ABduction/ADduction: PROM;10 reps;Right;Left Other Exercises Other Exercises: 3x20" calf stretch bilaterally    General Comments General comments (skin integrity, edema, etc.): Patient with continued decreased level of arousal similar to initial evaluation. No significant difference noted. Hand hygiene performed bilaterally prior to donning L palm guard and R resting hand splint. Will return in 4 hours for splint check. No family present at bedside.      Pertinent Vitals/Pain Pain Assessment: Faces Faces Pain Scale: Hurts little more Pain Location: w/ PROM at L hand Pain Descriptors / Indicators: Grimacing Pain Intervention(s): Limited activity within patient's tolerance;Monitored during session;Repositioned    Home Living  Prior Function            PT Goals (current goals can now be found in the care plan section) Acute Rehab PT Goals Patient Stated Goal: No family present this date and no PT Goal Formulation: Patient unable to participate in goal setting Time For Goal Achievement: 04/17/20 Potential to Achieve Goals: Fair Progress towards PT goals: Progressing toward goals    Frequency    Min 3X/week      PT Plan Current plan remains appropriate    Co-evaluation              AM-PAC PT "6 Clicks" Mobility   Outcome Measure  Help needed turning from your back to your side while in a flat bed  without using bedrails?: Total Help needed moving from lying on your back to sitting on the side of a flat bed without using bedrails?: Total Help needed moving to and from a bed to a chair (including a wheelchair)?: Total Help needed standing up from a chair using your arms (e.g., wheelchair or bedside chair)?: Total Help needed to walk in hospital room?: Total Help needed climbing 3-5 steps with a railing? : Total 6 Click Score: 6    End of Session Equipment Utilized During Treatment: Oxygen Activity Tolerance: Patient limited by lethargy Patient left: in bed;with call bell/phone within reach;with bed alarm set;with SCD's reapplied Nurse Communication: Mobility status;Need for lift equipment PT Visit Diagnosis: Other symptoms and signs involving the nervous system (R29.898);Difficulty in walking, not elsewhere classified (R26.2);Unsteadiness on feet (R26.81);Muscle weakness (generalized) (M62.81)     Time: 1610-9604 PT Time Calculation (min) (ACUTE ONLY): 19 min  Charges:  $Therapeutic Activity: 8-22 mins                     Conni Slipper, PT, DPT Acute Rehabilitation Services Pager: [phone] Office: [phone]    Hannah Fischer 04/12/2020, 1:01 PM

## 2020-04-13 DIAGNOSIS — I10 Essential (primary) hypertension: Secondary | ICD-10-CM | POA: Diagnosis not present

## 2020-04-13 LAB — COMPREHENSIVE METABOLIC PANEL
ALT: 17 U/L (ref 0–44)
AST: 23 U/L (ref 15–41)
Albumin: 2.6 g/dL — ABNORMAL LOW (ref 3.5–5.0)
Alkaline Phosphatase: 92 U/L (ref 38–126)
Anion gap: 10 (ref 5–15)
BUN: 64 mg/dL — ABNORMAL HIGH (ref 8–23)
CO2: 31 mmol/L (ref 22–32)
Calcium: 10.3 mg/dL (ref 8.9–10.3)
Chloride: 100 mmol/L (ref 98–111)
Creatinine, Ser: 1.4 mg/dL — ABNORMAL HIGH (ref 0.44–1.00)
GFR, Estimated: 37 mL/min — ABNORMAL LOW (ref >60)
Glucose, Bld: 136 mg/dL — ABNORMAL HIGH (ref 70–99)
Potassium: 4.3 mmol/L (ref 3.5–5.1)
Sodium: 141 mmol/L (ref 135–145)
Total Bilirubin: 0.6 mg/dL (ref 0.3–1.2)
Total Protein: 6.5 g/dL (ref 6.5–8.1)

## 2020-04-13 LAB — GLUCOSE, CAPILLARY
Glucose-Capillary: 135 mg/dL — ABNORMAL HIGH (ref 70–99)
Glucose-Capillary: 149 mg/dL — ABNORMAL HIGH (ref 70–99)
Glucose-Capillary: 175 mg/dL — ABNORMAL HIGH (ref 70–99)
Glucose-Capillary: 162 mg/dL — ABNORMAL HIGH (ref 70–99)
Glucose-Capillary: 119 mg/dL — ABNORMAL HIGH (ref 70–99)

## 2020-04-13 MED ORDER — FREE WATER
200.0000 mL | Freq: Four times a day (QID) | Status: DC
  Administered 2020-04-13 – 2020-05-01 (×66): 200 mL

## 2020-04-13 MED ORDER — TIZANIDINE HCL 4 MG PO TABS
2.0000 mg | ORAL_TABLET | Freq: Every day | ORAL | Status: AC
  Administered 2020-04-13 – 2020-04-14 (×2): 2 mg
  Filled 2020-04-13 (×2): qty 1

## 2020-04-13 MED ORDER — OXYCODONE HCL 5 MG PO TABS
2.5000 mg | ORAL_TABLET | Freq: Four times a day (QID) | ORAL | Status: DC
  Administered 2020-04-13 – 2020-04-20 (×29): 2.5 mg
  Filled 2020-04-13 (×29): qty 1

## 2020-04-13 MED ORDER — OXYCODONE HCL 5 MG PO TABS
2.5000 mg | ORAL_TABLET | Freq: Four times a day (QID) | ORAL | Status: DC
  Filled 2020-04-13: qty 1

## 2020-04-13 MED ORDER — TIZANIDINE HCL 4 MG PO TABS: 2.0000 mg | ORAL_TABLET | Freq: Three times a day (TID) | ORAL | Status: DC

## 2020-04-13 MED ORDER — METAXALONE 800 MG PO TABS
400.0000 mg | ORAL_TABLET | Freq: Three times a day (TID) | ORAL | Status: DC
  Filled 2020-04-13: qty 1
  Filled 2020-04-13: qty 0.5

## 2020-04-13 MED ORDER — TIZANIDINE HCL 4 MG PO TABS: 2.0000 mg | ORAL_TABLET | Freq: Every day | ORAL | Status: DC

## 2020-04-13 MED ORDER — METAXALONE 800 MG PO TABS
400.0000 mg | ORAL_TABLET | Freq: Three times a day (TID) | ORAL | Status: DC
  Administered 2020-04-13 – 2020-04-15 (×8): 400 mg
  Filled 2020-04-13 (×9): qty 0.5

## 2020-04-13 MED ORDER — TIZANIDINE HCL 4 MG PO TABS
2.0000 mg | ORAL_TABLET | Freq: Three times a day (TID) | ORAL | Status: DC
  Administered 2020-04-15: 2 mg
  Filled 2020-04-13: qty 1

## 2020-04-13 NOTE — Progress Notes (Signed)
[facility] PROGRESS NOTE  Hannah Fischer IWL:[id number] DOB: May 10, 1932 DOA: 03/18/2020 PCP: Sigmund Hazel, MD  Status: Remains inpatient appropriate because:Altered mental status, Unsafe d/c plan and Inpatient level of care appropriate due to severity of illness   Dispo: The patient is from: Home              Anticipated d/c is to: SNF              Patient currently is not medically stable to d/c.               Barriers to discharge: Patient very slow to progress with PT and OT and unclear if she is an appropriate rehab candidate for SNF or will need long-term placement.  Had PEG tube placed on 3/8.  Family has confirmed that they do not have the resources to pay for skilled nursing facility services out-of-pocket once insurance benefits exhausted.   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 Shehad gone to bedin the morning her husbandhad trouble waking her up. In the ED she was noted to be responsive to pain. A code stroke was called. She was outside the window for TPA. An emergent CT did not show any acute abnormality. A CTA was performed and did not reveal any obvious stenosis. An MRI of the brain was performed on 2/13:Punctate acute infarction in the left superior cerebellumadjacent to the superior cerebellar peduncle. Bilateral thalamicacute infarction extending back into the mid brain. This is consistent with artery of Percheron infarction.She continues to wax and wane from a mental status standpoint. She has cortrak. Palliative and neuro are following, family eventually  decided for PEG which is now pending Plavix washout  Subjective: Lethargic-seems to be having significant pain with PROM  Objective: Vitals:   04/13/20 0804 04/13/20 1234  BP: 136/75 (!) 144/74  Pulse: 80 (!) 101  Resp: 18 16  Temp: 97.7 F (36.5 C) 98.9 F (37.2 C)  SpO2: 96% 97%    Intake/Output Summary (Last 24 hours) at 04/13/2020 1236 Last data filed at 04/13/2020 1740 Gross per 24 hour  Intake 0 ml  Output 600 ml  Net -600 ml   Filed Weights   04/04/20 0438 04/05/20 0500 04/08/20 0423  Weight: 81.8 kg 79.5 kg 80 kg    Exam:  Constitutional: NAD, lethargic, restless during OT attempts at manipulation of extremities, appears to be in pain with any palpation or movement of extremities Respiratory: clear to auscultation bilaterally. Normal respiratory effort. No accessory muscle use.  Stable on room air Cardiovascular: Regular rate and rhythm, No extremity edema.  No peripheral edema Abdomen: Cortrak in place for continuous tube feedings.  No tenderness, no masses palpated. Bowel sounds positive. LBM 3/9 Genitourinary: Right nephrostomy tube with yellow urine noted in collection bag Neurologic: Cranial nerves II through XII appear to be grossly intact although patient unable to participate with exam so unable to accurately assess.  She is able to somewhat resist manual opening of eye.  She is noted to have Psychiatric: Lethargic.  Unable to accurately assess orientation since not awake, noninteractive and nonverbal.   Assessment/Plan: Acute problems: Bilateral Thalamic Infarcts Persistent  acute metabolic encephalopathy  -due to CVA, mental status waxing and waning, at times she is minimally responsive versus talking some.  Neurology following -Initially started on Provigil with gradual up titration but unfortunately developed tremulousness and dose was discontinued.   -She had also been given a trial of amantadine but this was discontinued.   -She has a prior history of  embolic infarcts with imaging on admission showed punctate acute infarction in the left superior cerebellum adjacent to the superior cerebellar peduncle.  Bilateral thalamic acute infarction extending back into the midbrain, consistent with artery of Percheron infarction.    -She had an event with asymmetric pupils on 2/25, had a repeat MRI without any new acute findings and expected evolution of known infarcts.   EEG 2/13 with moderate diffuse encephalopathy no apparent underlying seizure activity -Continue statin, Plavix on hold for anticipated PEG tube placement  Hypertonicity and spasticity secondary to recent brain injury from stroke -Patient having significant hypertonicity of lower extremities which preclude OT and PT working effectively with her -She is also having spasticity related tremors in the upper extremities also impeding ability to participate with therapy -In addition with severe hypertonicity appears to be causing the patient pain -We will go ahead and start low-dose Skelaxin 400 mg 3 times daily.  Baclofen likely would be a better agent in regards to managing spasticity but given her advanced age and recent stroke she is at risk for having hallucinations from this medication -We will begin tizanidine 2 mg at bedtime x2 doses and then increase to 3 times daily -Begin low-dose OxyIR 2.5 mg scheduled every 6 hours -I will consult Dr. Riley Kill with rehab medicine to assist with titrating these medications  Goals of care discussion  -with palliative team x2 last interaction on 3/7.  At that time despite being made aware of patient's poor status and poor prognosis her husband reported to the palliative provider that he was just not ready to let her go.  Her husband did wish for PEG placement now tentatively scheduled for 3/14 after washout of Plavix -Husband is agreeable to outpatient palliative provider follow-up in the event his wife further declines and would need referral to hospice  care -Dtr in law states husband told by neurology that if pt were to improve and fully awaken it would take at least 2 months  Dysphagia/Nutrition Nutrition Status: Nutrition Problem: Inadequate oral intake Etiology: inability to eat Signs/Symptoms: NPO status Interventions: Tube feeding,Prostat Estimated body mass index is 31.24 kg/m as calculated from the following:   Height as of this encounter: 5\' 3"  (1.6 m).   Weight as of this encounter: 80 kg. -Remains lethargic with most recent SLP evaluation on 3/7 recommended continued n.p.o. status-continue cortrack for tube feedings - Awaiting PEG 3/14  CKD 3A- AKI due to dehydration  Right Sided Hydronephrosis  -Baseline creatinine ~ 1.0-1.3 -Continue free water per tube and watching I & O closely to prevent heart failure exacerbation -Earlier in the hospitalization due to steady rise of creatinine arenal ultrasound completed whichshowed right sided hydronephrosis(no mass or definite calcification identified).  Nephrology, urology consulted and she is status post right nephrostomy tube placement by IR on 2/21.  -Creatinine overall stable with most recent reading on 3/9 of 1.36 with a BUN of 69 -3/10 Issues w/ mild urinary retention overnight requiring I/O cath-follow bladder scans  Diabetes mellitus 2 on current long-term insulin with hyperglycemia -Continue on insulin sliding scale every 4 hours while she is on tube feeds -basal insulin,  bolus insulin - adjust prn -CBGs have been between 110 and 164 over the past 24 hours -HgbA1c = 7.6  Hypertension/chronic grade 2 diastolic heart failure -2D echoperformedduring this admission reveals grade 2 diastolic dysfunction -Remains euvolemic although overall net INO less than 10,000 cc since admission -Continue Norvasc  Physical deconditioning/residual left hemiplegia from prior stroke PT eval 3/7:    Pt progressing very slowly towards physical therapy goals. Pt sat EOB ~18 minutes  with +2 assist. Therapist in front facilitating functional activity with tech posteriorly providing truncal support. Therapist facilitated pt wiping eyes and mouth with a washcloth. Once washcloth against skin, pt initiating wiping motion. Pt also maintaining a propped position laterally on both sides with assist to get into position. Son and husband present throughout session and attempting to communicate with pt, however with no eye opening or obvious response. Pt appeared to attempt a smile occasionally to son's voice but was very faint. Of note, pt holding B hands in a fist within mitts. It appeared that fingernails were digging into palms. Washcloths were rolled and placed into hands within mitts to avoid a pressure injury. Occupational therapist notified to assess next session. Will continue to follow and progress as able per POC.      Other problems: Group B strep UTI/recent fevers -GBS UTI from 2/12 appears to have been treated with ceftriaxone. 2/27 yeast UTI, completed fluconazole, WBC remains normal this morning  -Fever on 2/27, CXR with bibasilar opacities, urine culture showed yeast and completed fluconazole. Blood cultures1/2 with staph epidermidis (likely contaminated) -Low-grade temp 100.2 as of 3/5 overnight, afebrile since. Closely monitor  Intermittent agitation  -Has had issues with agitation when awake but for the most part remains lethargic -Continue liberalized visitation so family can sit with her  Gluteal skin breakdown -[place] and barrier cream -Continue air mattress to prevent further skin breakdown -turn as able Wound / Incision (Open or Dehisced) 03/27/20 Puncture Flank Right;Posterior nephrostomy tube placement insertion site (Active)  Date First Assessed/Time First Assessed: 03/27/20 1406   Wound Type: Puncture  Location: Flank  Location Orientation: Right;Posterior  Wound Description (Comments): nephrostomy tube placement insertion site  Present on Admission: No     Assessments 03/27/2020  8:09 AM 04/12/2020  8:00 AM  Dressing Type Gauze (Comment) Gauze (Comment)  Dressing Changed New Reinforced  Dressing Status Clean;Dry;Intact Clean;Dry;Intact  Dressing Change Frequency - Daily  Site / Wound Assessment Clean;Dry Dressing in place / Unable to assess  Closure - Sutures  Drainage Amount - None  Drainage Description - Serosanguineous  Treatment - Other (Comment)     No Linked orders to display    Hypothyroidism -Continue Synthroid per tube -TSH 6.651 on 2/12 and likely an acute phase reactant consistent with sick euthyroid syndrome -TSH with T3 and T4 will need to be repeated in May 2022  Hyperkalemia Resolved -Potassium normal   Data Reviewed: Basic Metabolic Panel: Recent Labs  Lab 04/08/20 0232 04/10/20 0311 04/11/20 0407 04/12/20 0350 04/13/20 0358  NA 136 136 136 139 141  K 4.7 4.8 4.9 4.6 4.3  CL 97* 96* 96* 100 100  CO2 GLUCOSE 124* 121* 188* 140* 136*  BUN 40* 61* 62* 69* 64*  CREATININE 0.99 1.45* 1.31* 1.36* 1.40*  CALCIUM 9.9 10.2 10.3 10.2 10.3   Liver Function Tests: Recent Labs  Lab 04/08/20 0232 04/10/20 0311 04/13/20 0358  AST ALT ALKPHOS 95 95 92  BILITOT  0.5 0.7 0.6  PROT 6.7 6.5 6.5  ALBUMIN 2.6* 2.7* 2.6*   No results for input(s): LIPASE, AMYLASE in the last 168 hours. No results for input(s): AMMONIA in the last 168 hours. CBC: Recent Labs  Lab 04/08/20 0232 04/10/20 0311 04/11/20 0407 04/12/20 0350  WBC 7.4 7.4 7.1 6.6  HGB 11.8* 11.7* 11.5* 11.6*  HCT 36.7 35.6* 34.8* 35.0*  MCV 98.9 96.7 96.9 97.8  PLT 382 444* 408* 389   Cardiac Enzymes: No results for input(s): CKTOTAL, CKMB, CKMBINDEX, TROPONINI in the last 168 hours. BNP (last 3 results) No results for input(s): BNP in the last 8760 hours.  ProBNP (last 3 results) No results for input(s): PROBNP in the last 8760 hours.  CBG: Recent Labs  Lab 04/12/20 1607 04/12/20 1951  04/12/20 2356 04/13/20 0427 04/13/20 0803  GLUCAP 196* 127* 152* 135* 149*    Recent Results (from the past 240 hour(s))  Culture, Urine     Status: None   Collection Time: 04/04/20  4:32 PM   Specimen: Urine, Random  Result Value Ref Range Status   Specimen Description URINE, RANDOM  Final   Special Requests NONE  Final   Culture   Final    NO GROWTH Performed at [facility], [address], [place], [place] [address]    Report Status 04/07/2020 FINAL  Final     Studies: No results found.  Scheduled Meds: . amLODipine  5 mg Per Tube Daily  . chlorhexidine  15 mL Mouth Rinse BID  . Chlorhexidine Gluconate Cloth  6 each Topical Daily  . feeding supplement (PROSource TF)  45 mL Per Tube TID  . free water  100 mL Per Tube Q6H  . heparin  5,000 Units Subcutaneous Q8H  . insulin aspart  0-9 Units Subcutaneous Q4H  . insulin aspart  2 Units Subcutaneous Q4H  . insulin detemir  5 Units Subcutaneous Daily  . levothyroxine  112 mcg Per Tube QAC breakfast  . liver oil-zinc oxide   Topical BID  . mouth rinse  15 mL Mouth Rinse q12n4p  . metaxalone  400 mg Per Tube TID  . modafinil  200 mg Per Tube Daily  . oxyCODONE  2.5 mg Per Tube Q6H  . rosuvastatin  10 mg Per Tube Daily  . sodium chloride flush  3 mL Intravenous Once  . sodium chloride flush  5 mL Intracatheter Q8H  . tiZANidine  2 mg Per Tube QHS   Followed by  . [START ON 04/15/2020] tiZANidine  2 mg Per Tube TID   Continuous Infusions: . sodium chloride 10 mL/hr at 03/24/20 0305  . feeding supplement (GLUCERNA 1.2 CAL) 1,000 mL (04/13/20 0917)    Principal Problem:   Acute encephalopathy Active Problems:   Diabetes mellitus (HCC)   Hypothyroidism   History of embolic stroke   Acute bilateral thalamic infarction (HCC)   Dysphagia   CKD (chronic kidney disease), symptom management only, stage 3 (moderate) (HCC)   Hydronephrosis, right   Group B streptococcal UTI   Uncontrolled type 2 diabetes mellitus  with hyperglycemia, with long-term current use of insulin (HCC)   Chronic diastolic heart failure (HCC)   Essential hypertension   Physical debility   Left hemiplegia [facility])   Consultants: Neurology  Palliative care Nephrology Urology IR  Procedures: Right percutaneous nephrostomy 2/21  Antibiotics: Anti-infectives (From admission, onward)   Start     Dose/Rate Route Frequency Ordered Stop   04/07/20 2000  fluconazole (DIFLUCAN) 10  MG/ML suspension 100 mg        100 mg Per Tube Every evening 04/07/20 1236 04/08/20 2023   04/03/20 2000  fluconazole (DIFLUCAN) 40 MG/ML suspension 100 mg  Status:  Discontinued       "Followed by" Linked Group Details   100 mg Per Tube Every 24 hours 04/02/20 1752 04/07/20 1236   04/02/20 1900  fluconazole (DIFLUCAN) 40 MG/ML suspension 200 mg       "Followed by" Linked Group Details   200 mg Per Tube  Once 04/02/20 1752 04/02/20 2020   03/26/20 0730  cefTRIAXone (ROCEPHIN) 1 g in sodium chloride 0.9 % 100 mL IVPB        1 g 200 mL/hr over 30 Minutes Intravenous Every 24 hours 03/26/20 0644 03/30/20 0844   03/18/20 1900  azithromycin (ZITHROMAX) 500 mg in sodium chloride 0.9 % 250 mL IVPB  Status:  Discontinued        500 mg 250 mL/hr over 60 Minutes Intravenous Every 24 hours 03/18/20 1830 03/19/20 1106   03/18/20 1900  cefTRIAXone (ROCEPHIN) 2 g in sodium chloride 0.9 % 100 mL IVPB  Status:  Discontinued        2 g 200 mL/hr over 30 Minutes Intravenous Every 24 hours 03/18/20 1830 03/19/20 1106       Time spent: 30 minutes    Junious SilkAllison Ellis ANP  [facility] 7 am - 330 pm/M-F for direct patient care and secure chat Please refer to Amion for contact info 26  days

## 2020-04-13 NOTE — Progress Notes (Signed)
Civil engineer, contracting [facility])  Hospital Liaison RN note         Notified by [facility] manager of patient/family request for [facility] Palliative services at [facility] after discharge.               [facility] Palliative team will follow up closely after discharge.         Please call with any hospice or palliative related questions.         Thank you for the opportunity to participate in this patient's care.     Gillian Scarce, BSN, RN [facility] Liaison (listed on [place] under Hospice/[facility])    [phone] (24h on call)

## 2020-04-13 NOTE — TOC Progression Note (Addendum)
Transition of Care [facility]) - Progression Note    Patient Details  Name: Hannah Fischer MRN: [id number] Date of Birth: 01/14/1933  Transition of Care [facility]) CM/SW Contact  Janae Bridgeman, RN Phone Number: 04/13/2020, 1:48 PM  Clinical Narrative:    Case management spoke with the patient's daughter-in-law, [name], on the phone regarding transitions of care.  The patient's husband is hopeful that the patient should be given the opportunity over the next couple of months to recover from her CVA injury.  [name] states that the family is agreeable to have Palliative Care services follow the patient and the family was given choice regarding this and did not have a preference.  I plan to follow up with [facility] and place a referral for follow up on an outpatient basis.  The patient's family is planning of patient transferring to [facility] SNF once she is medically stable to do so.  The patient has planned PEG placement next week at this point.  She has a current Cortrak and Right sided nephrostomy tube. [name], daughter-in-law states that she is in the process of completing the patient's Medicaid application and family understands that they will need to private pay for the SNF facility once [employer]BS is unable to cover the expenses for the SNF admission.  CM and MSW will continue to offer the patient and family support and will follow for [facility] admission to [facility] for placement.  04/13/2020 1450 - I spoke with Christlyn Brooke Dare, CM with [facility] and referral made to provide patient and family with Palliative care support on an outpatient basis at this point.     Expected Discharge Plan: Skilled Nursing Facility Barriers to Discharge: Continued Medical Work up,Insurance Authorization  Expected Discharge Plan and Services Expected Discharge Plan: Skilled Nursing Facility     Post Acute Care Choice: Skilled Nursing Facility Living arrangements for the past 2 months: Single Family  Home                                       Social Determinants of Health (SDOH) Interventions    Readmission Risk Interventions Readmission Risk Prevention Plan 04/13/2020  Transportation Screening Complete  Medication Review Oceanographer) Complete  PCP or Specialist appointment within 3-5 days of discharge Complete  HRI or Home Care Consult Complete  SW Recovery Care/Counseling Consult Complete  Palliative Care Screening Complete  Skilled Nursing Facility Complete  Some recent data might be hidden

## 2020-04-13 NOTE — Progress Notes (Signed)
Pt bladder scanned twice at 2200 and midnight, read 273 and respectively, In and Out Cath done at 0230, drained of clear urine out and emptied from the Nephrostomy bag peri care done and pt made comfortable in bed, will however continue to  Monitor. Obasogie-Asidi, Chaylee Ehrsam Efe

## 2020-04-13 NOTE — Progress Notes (Deleted)
Occupational Therapy Treatment    04/13/20 1400  OT Visit Information  Last OT Received On 04/13/20  Assistance Needed +2  History of Present Illness Pt is an 87 y/o female admitted 03/18/20 with CVA. Imaging (+) for bilateral medial thalamic and punctate infarcts and L superior cerebellar infarcts. PMH significant for multiple CVAs with most recent ~3 months ago, residual L-sided hemiplegia with recent d/c from outpatient PT/OT with return to baseline level of function, TIA, DM, PE, HTN.  Precautions  Precautions Fall  Precaution Comments cortrak, nephrostomy drain, at risk for skin breakdown  Pain Assessment  Pain Assessment Faces  Faces Pain Scale 2  Pain Location w/ PROM at L hand and cervical neck  Pain Descriptors / Indicators Grimacing  Pain Intervention(s) Limited activity within patient's tolerance;Monitored during session;Repositioned  Cognition  General Comments Level of arousal remains unchanged. Patient continues to keep eyes closed throughout session. Does not attempt to assist therapist with repositioning in supine. Patient does appear to resist movement in RUE this date.  General Comments  General comments (skin integrity, edema, etc.) RN education on purpose of R resting hand splint and R/L palm guards. RN expressed verbal understanding. Hypertonicity noted in RUE and BLE. MD to adjust mets to address tone. L palm guard removed and hand hygiene completed bilaterally. L palm guard re-donned and R resting hand splint donned with goal for patient to tolerate 6hr of wearing without signs/symptoms of redness or skin breakdown. Will return after 6hr for splint check. No family present at bedside this a.m. for family education.  OT - End of Session  Activity Tolerance Patient limited by lethargy  Patient left in bed;with call bell/phone within reach;with family/visitor present;with restraints reapplied  Nurse Communication Other (comment) (L palm guard and R resting hand splint in  place. Will check back after 6hr.)  OT Assessment/Plan  OT Plan Discharge plan remains appropriate;Frequency remains appropriate  OT Visit Diagnosis Unsteadiness on feet (R26.81);Other abnormalities of gait and mobility (R26.89);Muscle weakness (generalized) (M62.81);Hemiplegia and hemiparesis;Other symptoms and signs involving cognitive function;Pain  Hemiplegia - Right/Left Right  Hemiplegia - dominant/non-dominant Dominant  Hemiplegia - caused by Cerebral infarction  OT Frequency (ACUTE ONLY) Min 1X/week  Follow Up Recommendations SNF  OT Equipment Hospital bed

## 2020-04-13 NOTE — Progress Notes (Signed)
Occupational Therapy Treatment    04/13/20 1400  OT Visit Information  Last OT Received On 04/13/20  Assistance Needed +2  History of Present Illness Pt is an 87 y/o female admitted 03/18/20 with CVA. Imaging (+) for bilateral medial thalamic and punctate infarcts and L superior cerebellar infarcts. PMH significant for multiple CVAs with most recent ~3 months ago, residual L-sided hemiplegia with recent d/c from outpatient PT/OT with return to baseline level of function, TIA, DM, PE, HTN.  Precautions  Precautions Fall  Precaution Comments cortrak, nephrostomy drain, at risk for skin breakdown  Pain Assessment  Pain Assessment Faces  Faces Pain Scale 2  Pain Location w/ PROM at L hand and cervical neck  Pain Descriptors / Indicators Grimacing  Pain Intervention(s) Limited activity within patient's tolerance;Monitored during session;Repositioned  Cognition  General Comments Level of arousal remains unchanged. Patient continues to keep eyes closed throughout session. Does not attempt to assist therapist with repositioning in supine. Patient does appear to resist movement in RUE this date.  General Comments  General comments (skin integrity, edema, etc.) RN education on purpose of R resting hand splint and R/L palm guards. RN expressed verbal understanding. Hypertonicity noted in RUE and BLE. MD to adjust mets to address tone. L palm guard removed and hand hygiene completed bilaterally. L palm guard re-donned and R resting hand splint donned with goal for patient to tolerate 6hr of wearing without signs/symptoms of redness or skin breakdown. Will return after 6hr for splint check. No family present at bedside this a.m. for family education.  OT - End of Session  Activity Tolerance Patient limited by lethargy  Patient left in bed;with call bell/phone within reach;with family/visitor present;with restraints reapplied  Nurse Communication Other (comment) (L palm guard and R resting hand splint in  place. Will check back after 6hr.)  OT Assessment/Plan  OT Plan Discharge plan remains appropriate;Frequency remains appropriate  OT Visit Diagnosis Unsteadiness on feet (R26.81);Other abnormalities of gait and mobility (R26.89);Muscle weakness (generalized) (M62.81);Hemiplegia and hemiparesis;Other symptoms and signs involving cognitive function;Pain  Hemiplegia - Right/Left Right  Hemiplegia - dominant/non-dominant Dominant  Hemiplegia - caused by Cerebral infarction  OT Frequency (ACUTE ONLY) Min 1X/week  Follow Up Recommendations SNF  OT Equipment Hospital bed  AM-PAC OT "6 Clicks" Daily Activity Outcome Measure (Version 2)  Help from another person eating meals? 1  Help from another person taking care of personal grooming? 1  Help from another person toileting, which includes using toliet, bedpan, or urinal? 1  Help from another person bathing (including washing, rinsing, drying)? 1  Help from another person to put on and taking off regular upper body clothing? 1  Help from another person to put on and taking off regular lower body clothing? 1  6 Click Score 6  OT Goal Progression  Progress towards OT goals Progressing toward goals  Acute Rehab OT Goals  Patient Stated Goal Unable to state.  OT Goal Formulation With family  Time For Goal Achievement 04/18/20  Potential to Achieve Goals Fair  ADL Goals  Additional ADL Goal #1 Patient will keep eyes open for 50% of OT treatment session demonstrating increased alertness and level of arousal.  Additional ADL Goal #2 Patient will maintain static sitting balance at EOB with no more than Min A in prep for seated ADLs.  Additional ADL Goal #3 Patient will attend to 1 grooming task >1-3 min with Mod cues and hand over hand assist.  Additional ADL Goal #4 RN/family will don/doff R  resting hand splint and R/L palm guards correctly and recall established wearing schedule with I.  OT Time Calculation  OT Start Time (ACUTE ONLY) 6553  OT Stop  Time (ACUTE ONLY) 0933  OT Time Calculation (min) 15 min  OT General Charges  $OT Visit 1 Visit  OT Treatments  $Therapeutic Activity 8-22 mins

## 2020-04-13 NOTE — Progress Notes (Signed)
SLP Cancellation Note  Patient Details Name: Hannah Fischer MRN: [id number] DOB: [birth year]   Cancelled treatment:       Reason Eval/Treat Not Completed: Other (comment). Pt minimally responsive and in pain. Will be starting some new meds today so will f/u tomorrow to see if these impact her arousal   DeBlois, Riley Nearing 04/13/2020, 10:59 AM

## 2020-04-13 NOTE — Progress Notes (Signed)
Nutrition Follow-up  DOCUMENTATION CODES:   Not applicable  INTERVENTION:   Continue TF via Cortrak tube:  Glucerna 1.2 at 45 ml/h (1027m)  Prosource TF 45 ml TID  Free water flushes 100 ml every 6 hours   Provides 1416 kcal, 93 gm protein, 1469 ml total free water daily  This regimen may be continued once PEG is placed and ready for use.   NUTRITION DIAGNOSIS:   Inadequate oral intake related to inability to eat as evidenced by NPO status.  Ongoing   GOAL:   Patient will meet greater than or equal to 90% of their needs  Met with TF  MONITOR:   Diet advancement,TF tolerance  REASON FOR ASSESSMENT:   Consult Enteral/tube feeding initiation and management  ASSESSMENT:   Pt with PMH of HTN, HLD, Dm, GERD, COVID 02/2018, PE, IBS, cerebral embolic strokes 150/56with residula L hand weakness now admitted with bilateral thalamic infarct extending back to the midbrain c/w Percheron stroke.  2/14 Cortrak placed (gastric) 2/19 ultrasound revealed hydronephrosis, R worse that L and chronic R UPJ obstruction. 2/21 R percutaneous nephrostomy drain placement in IR. 3/1 TF held due to CMartbecoming dislodged 3/2 Cortrak repositioned; gastric tip  Pt very lethargic and minimally responsive at time of RD visit.   Patient continues to require TF via Cortrak. Current regimen: Glucerna 1.2 at 45 ml/hr with Prosource TF 45 ml TID and 1038mfree water flushes Q6H. Tolerating well. PMT met with pt/family and decision was made to proceed with PEG placement; scheduled for 3/14. TF regimen may be continued after PEG is placed.   UOP: 120024mocumented x24 hours  Admit wt: 78.9 kg Current wt: 80 kg  Medications: ss novolog Q4H, 2 units novolog Q4H, 5 units levemir daily Labs reviewed.  CBGs [phone] Coordinator following)  Diet Order:   Diet Order            Diet NPO time specified  Diet effective midnight                 EDUCATION NEEDS:   No education  needs have been identified at this time  Skin:  Skin Assessment: Reviewed RN Assessment  Last BM:  3/9 type 6  Height:   Ht Readings from Last 1 Encounters:  03/18/20 5' 3"  (1.6 m)    Weight:   Wt Readings from Last 1 Encounters:  04/08/20 80 kg    Ideal Body Weight:  52.2 kg  BMI:  Body mass index is 31.24 kg/m.  Estimated Nutritional Needs:   Kcal:  1400-1600  Protein:  85-100 grams  Fluid:  > 1.5 L/day   AmaLarkin InaS, RD, LDN RD pager number and weekend/on-call pager number located in [place]

## 2020-04-13 NOTE — Progress Notes (Signed)
Occupational Therapy Treatment    04/13/20 1449  OT Visit Information  Last OT Received On 04/13/20  Assistance Needed +2  History of Present Illness Pt is an 87 y/o female admitted 03/18/20 with CVA. Imaging (+) for bilateral medial thalamic and punctate infarcts and L superior cerebellar infarcts. PMH significant for multiple CVAs with most recent ~3 months ago, residual L-sided hemiplegia with recent d/c from outpatient PT/OT with return to baseline level of function, TIA, DM, PE, HTN.  Precautions  Precautions Fall  Precaution Comments cortrak, nephrostomy drain, at risk for skin breakdown  Pain Assessment  Pain Assessment Faces  Faces Pain Scale 2  Pain Location w/ PROM at L hand and cervical neck  Pain Descriptors / Indicators Grimacing  Pain Intervention(s) Monitored during session  Cognition  General Comments Level of arousal remains unchanged. Patient continues to keep eyes closed throughout session. Does not attempt to assist therapist with repositioning in supine. Patient does appear to resist movement in RUE this date.  General Comments  General comments (skin integrity, edema, etc.) Husband present at bedside at time of skin check. Education provided on purpose of R/L palm guards and R resting hand splint. Husband expressed verbal understanding. After education from this therapist, husband able to remove L palm guard, complete hand hygiene, and re-don. Husband notes patient not verbalizing this date as she's done in the past. Patient tolerated R resting hand splint for 6 hours without signs/symptoms of skin breakdown. Will establish wearing scheudle and place nursing order at time of next treatment session. OT will continue to follow.  OT - End of Session  Activity Tolerance Patient limited by lethargy  Patient left in bed;with call bell/phone within reach;with family/visitor present;with restraints reapplied  Nurse Communication Other (comment) (L palm guard and R resting hand  splint in place. Will check back after 6hr.)  OT Assessment/Plan  OT Plan Discharge plan remains appropriate;Frequency remains appropriate  OT Visit Diagnosis Unsteadiness on feet (R26.81);Other abnormalities of gait and mobility (R26.89);Muscle weakness (generalized) (M62.81);Hemiplegia and hemiparesis;Other symptoms and signs involving cognitive function;Pain  Hemiplegia - Right/Left Right  Hemiplegia - dominant/non-dominant Dominant  Hemiplegia - caused by Cerebral infarction  OT Frequency (ACUTE ONLY) Min 1X/week  Follow Up Recommendations SNF  OT Equipment Hospital bed  AM-PAC OT "6 Clicks" Daily Activity Outcome Measure (Version 2)  Help from another person eating meals? 1  Help from another person taking care of personal grooming? 1  Help from another person toileting, which includes using toliet, bedpan, or urinal? 1  Help from another person bathing (including washing, rinsing, drying)? 1  Help from another person to put on and taking off regular upper body clothing? 1  Help from another person to put on and taking off regular lower body clothing? 1  6 Click Score 6  OT Goal Progression  Progress towards OT goals Progressing toward goals  Acute Rehab OT Goals  Patient Stated Goal Unable to state.  OT Goal Formulation With family  Time For Goal Achievement 04/18/20  Potential to Achieve Goals Fair  ADL Goals  Additional ADL Goal #1 Patient will keep eyes open for 50% of OT treatment session demonstrating increased alertness and level of arousal.  Additional ADL Goal #2 Patient will maintain static sitting balance at EOB with no more than Min A in prep for seated ADLs.  Additional ADL Goal #3 Patient will attend to 1 grooming task >1-3 min with Mod cues and hand over hand assist.  Additional ADL Goal #4 RN/family  will don/doff R resting hand splint and R/L palm guards correctly and recall established wearing schedule with I.  OT Time Calculation  OT Start Time (ACUTE ONLY) 1435   OT Stop Time (ACUTE ONLY) 1444  OT Time Calculation (min) 9 min  OT General Charges  $OT Visit 1 Visit  OT Treatments  $Orthotics/Prosthetics Check 8-22 mins

## 2020-04-14 DIAGNOSIS — I10 Essential (primary) hypertension: Secondary | ICD-10-CM | POA: Diagnosis not present

## 2020-04-14 LAB — BASIC METABOLIC PANEL
Anion gap: 10 (ref 5–15)
BUN: 67 mg/dL — ABNORMAL HIGH (ref 8–23)
CO2: 28 mmol/L (ref 22–32)
Calcium: 10.1 mg/dL (ref 8.9–10.3)
Chloride: 100 mmol/L (ref 98–111)
Creatinine, Ser: 1.34 mg/dL — ABNORMAL HIGH (ref 0.44–1.00)
GFR, Estimated: 39 mL/min — ABNORMAL LOW (ref >60)
Glucose, Bld: 124 mg/dL — ABNORMAL HIGH (ref 70–99)
Potassium: 4.3 mmol/L (ref 3.5–5.1)
Sodium: 138 mmol/L (ref 135–145)

## 2020-04-14 LAB — GLUCOSE, CAPILLARY
Glucose-Capillary: 103 mg/dL — ABNORMAL HIGH (ref 70–99)
Glucose-Capillary: 108 mg/dL — ABNORMAL HIGH (ref 70–99)
Glucose-Capillary: 121 mg/dL — ABNORMAL HIGH (ref 70–99)
Glucose-Capillary: 121 mg/dL — ABNORMAL HIGH (ref 70–99)
Glucose-Capillary: 133 mg/dL — ABNORMAL HIGH (ref 70–99)
Glucose-Capillary: 149 mg/dL — ABNORMAL HIGH (ref 70–99)
Glucose-Capillary: 185 mg/dL — ABNORMAL HIGH (ref 70–99)

## 2020-04-14 NOTE — Progress Notes (Signed)
[facility] PROGRESS NOTE  Hannah Fischer ZOX:[id number] DOB: 12/04/1932 DOA: 03/18/2020 PCP: Sigmund HazelMiller, Lisa, MD  Status: Remains inpatient appropriate because:Altered mental status, Unsafe d/c plan and Inpatient level of care appropriate due to severity of illness   Dispo: The patient is from: Home              Anticipated d/c is to: SNF              Patient currently is not medically stable to d/c.               Barriers to discharge: Patient very slow to progress with PT and OT and unclear if she is an appropriate rehab candidate for SNF or will need long-term placement.  Had PEG tube placed on 3/8.  Family has confirmed that they do not have the resources to pay for skilled nursing facility services out-of-pocket once insurance benefits exhausted.   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 Shehad gone to bedin the morning her husbandhad trouble waking her up. In the ED she was noted to be responsive to pain. A code stroke was called. She was outside the window for TPA. An emergent CT did not show any acute abnormality. A CTA was performed and did not reveal any obvious stenosis. An MRI of the brain was performed on 2/13:Punctate acute infarction in the left superior cerebellumadjacent to the superior cerebellar peduncle. Bilateral thalamicacute infarction extending back into the mid brain. This is consistent with artery of Percheron infarction.She continues to wax and wane from a mental status standpoint. She has cortrak. Palliative and neuro are following, family  eventually decided for PEG which is now pending Plavix washout  Subjective: Patient awakened with combination of verbal and tactile stimulation.  Noted to have purposeful movement towards face with right hand.  Was able to state "yes" and use the word "ow" in regards to pain in legs with palpation  Objective: Vitals:   04/14/20 0005 04/14/20 0456  BP: 115/65 135/72  Pulse: 71 84  Resp: 19 19  Temp: 98.7 F (37.1 C) 98.3 F (36.8 C)  SpO2: 96% 97%    Intake/Output Summary (Last 24 hours) at 04/14/2020 0804 Last data filed at 04/14/2020 0400 Gross per 24 hour  Intake --  Output 550 ml  Net -550 ml   Filed Weights   04/04/20 0438 04/05/20 0500 04/08/20 0423  Weight: 81.8 kg 79.5 kg 80 kg    Exam:  Constitutional: Calm, no acute distress when not stimulated Respiratory: Anterior lung sounds are clear, she is stable on room air Cardiovascular: Heart sounds are normal, no peripheral edema, hemodynamically stable, regular pulse Abdomen: Soft and nontender, Cortrak for tube feedings.  LBM 3/9 Genitourinary: Right nephrostomy tube with yellow urine noted in collection bag Neurologic: Cranial nerves II through XII intact.  Clarification: Patient appears to affect cataract surgery on left eye, right pupil 2 mm and reactive.  Noted with spontaneous movement of right upper extremity today.  Once alert and stimulated does have left upper extremity spasticity.  Continues to have painful hypertonicity in bilateral lower extremity Psychiatric: Remains lethargic but did awaken today.  Responded to name.  Minimally conversant so unable to accurately assess orientation.   Assessment/Plan: Acute problems: Bilateral Thalamic Infarcts Persistent acute metabolic encephalopathy  -due to CVA, mental status waxing and waning, at times she is minimally responsive versus talking some.  Neurology following -Initially started on Provigil with gradual up titration but unfortunately developed tremulousness  and dose was discontinued.   -She had also been given a trial of amantadine but this was discontinued.   -She has a prior history of embolic infarcts with imaging on admission showed punctate acute infarction in the left superior cerebellum adjacent to the superior cerebellar peduncle.  Bilateral thalamic acute infarction extending back into the midbrain, consistent with artery of Percheron infarction.    -She had an event with asymmetric pupils on 2/25, had a repeat MRI without any new acute findings and expected evolution of known infarcts.   EEG 2/13 with moderate diffuse encephalopathy no apparent underlying seizure activity -Continue statin, Plavix on hold for anticipated PEG tube placement  Hypertonicity and spasticity secondary to recent brain injury from stroke -Patient having significant hypertonicity of lower extremities which preclude OT and PT working effectively with her -She is also having spasticity related tremors in the upper extremities also impeding ability to participate with therapy -In addition with severe hypertonicity appears to be causing the patient pain -3/10 initiate low-dose Skelaxin 400 mg 3 times daily.  Baclofen likely would be a better agent in regards to managing spasticity but given her advanced age and recent stroke she is at risk for having hallucinations/cute delirium from this medication -3/10 began tizanidine 2 mg at bedtime x2 doses and then increase to 3 times daily -3/10 began low-dose OxyIR 2.5 mg scheduled every 6 hours -Consulted Dr. Riley Kill via secure chat to assist with titrating these medications  Goals of care discussion  -with palliative team x2 last interaction on 3/7.  At that time despite being made aware of patient's poor status and poor prognosis her husband reported to the palliative provider that he was just not ready to let her go.  Her husband did wish for PEG placement now tentatively scheduled for 3/14 after washout of Plavix -Husband is  agreeable to outpatient palliative provider follow-up in the event his wife further declines and would need referral to hospice care -Dtr in law states husband told by neurology that if pt were to improve and fully awaken it would take at least 2 months  Dysphagia/Nutrition Nutrition Status: Nutrition Problem: Inadequate oral intake Etiology: inability to eat Signs/Symptoms: NPO status Interventions: Tube feeding,Prostat Estimated body mass index is 31.24 kg/m as calculated from the following:   Height as of this encounter:  (1.6 m).   Weight as of this encounter: 80 kg. -Remains lethargic with most recent SLP evaluation on 3/7 recommended continued n.p.o. status-continue cortrack for tube feedings - Awaiting PEG 3/14  CKD 3A- AKI due to dehydration   Right Sided Hydronephrosis  -Baseline creatinine ~ 1.0-1.3 -Continue free water per tube and watching I & O closely to prevent heart failure exacerbation-on 3/10 creatinine had gone trending upward now 1.4.  Free water increased from 100 cc to 200 cc every 6 hours with creatinine on 3/11 of 1.34 -Earlier in the hospitalization due to steady rise of creatinine arenal ultrasound completed whichshowed right sided hydronephrosis(no mass or definite calcification identified).  Nephrology, urology consulted and she is status post right nephrostomy tube placement by IR on 2/21.  -Creatinine overall stable with most recent reading on 3/9 of  1.36 with a BUN of 69 -3/10 Issues w/ mild urinary retention overnight requiring I/O cath-follow bladder scans  Diabetes mellitus 2 on current long-term insulin with hyperglycemia -Continue on insulin sliding scale every 4 hours while she is on tube feeds -basal insulin, bolus insulin - adjust prn -CBGs have been between 110 and 164 over the past 24 hours -HgbA1c = 7.6  Hypertension/chronic grade 2 diastolic heart failure -2D echoperformedduring this admission reveals grade 2 diastolic  dysfunction -Remains euvolemic although overall net INO less than 10,000 cc since admission -Continue Norvasc  Physical deconditioning/residual left hemiplegia from prior stroke PT eval 3/7:    Pt progressing very slowly towards physical therapy goals. Pt sat EOB ~18 minutes with +2 assist. Therapist in front facilitating functional activity with tech posteriorly providing truncal support. Therapist facilitated pt wiping eyes and mouth with a washcloth. Once washcloth against skin, pt initiating wiping motion. Pt also maintaining a propped position laterally on both sides with assist to get into position. Son and husband present throughout session and attempting to communicate with pt, however with no eye opening or obvious response. Pt appeared to attempt a smile occasionally to son's voice but was very faint. Of note, pt holding B hands in a fist within mitts. It appeared that fingernails were digging into palms. Washcloths were rolled and placed into hands within mitts to avoid a pressure injury. Occupational therapist notified to assess next session. Will continue to follow and progress as able per POC.      Other problems: Group B strep UTI/recent fevers -GBS UTI from 2/12 appears to have been treated with ceftriaxone. 2/27 yeast UTI, completed fluconazole, WBC remains normal this morning  -Fever on 2/27, CXR with bibasilar opacities, urine culture showed yeast and completed fluconazole. Blood cultures1/2 with staph epidermidis (likely contaminated) -Low-grade temp 100.2 as of 3/5 overnight, afebrile since. Closely monitor  Intermittent agitation  -Has had issues with agitation when awake but for the most part remains lethargic -Continue liberalized visitation so family can sit with her  Gluteal skin breakdown -[place] and barrier cream -Continue air mattress to prevent further skin breakdown -turn as able Wound / Incision (Open or Dehisced) 03/27/20 Puncture Flank Right;Posterior  nephrostomy tube placement insertion site (Active)  Date First Assessed/Time First Assessed: 03/27/20 1406   Wound Type: Puncture  Location: Flank  Location Orientation: Right;Posterior  Wound Description (Comments): nephrostomy tube placement insertion site  Present on Admission: No    Assessments 03/27/2020  8:09 AM 04/13/2020  8:00 PM  Dressing Type Gauze (Comment) Gauze (Comment)  Dressing Changed New Other (Comment)  Dressing Status Clean;Dry;Intact Intact;Dry;Clean  Site / Wound Assessment Clean;Dry Dressing in place / Unable to assess     No Linked orders to display    Hypothyroidism -Continue Synthroid per tube -TSH 6.651 on 2/12 and likely an acute phase reactant consistent with sick euthyroid syndrome -TSH with T3 and T4 will need to be repeated in May 2022  Hyperkalemia Resolved -Potassium normal   Data Reviewed: Basic Metabolic Panel: Recent Labs  Lab 04/10/20 0311 04/11/20 0407 04/12/20 0350 04/13/20 0358 04/14/20 0355  NA 136 136 139 141 138  K 4.8 4.9 4.6 4.3 4.3  CL 96* 96* 100 100 100  CO2 27 28 26 31 28   GLUCOSE 121* 188* 140* 136* 124*  BUN 61* 62* 69* 64* 67*  CREATININE 1.45* 1.31* 1.36* 1.40* 1.34*  CALCIUM 10.2 10.3 10.2 10.3 10.1   Liver Function Tests: Recent Labs  Lab 04/08/20 0232 04/10/20  8676 04/13/20 0358  AST 22 23 23   ALT 16 15 17   ALKPHOS 95 95 92  BILITOT 0.5 0.7 0.6  PROT 6.7 6.5 6.5  ALBUMIN 2.6* 2.7* 2.6*   No results for input(s): LIPASE, AMYLASE in the last 168 hours. No results for input(s): AMMONIA in the last 168 hours. CBC: Recent Labs  Lab 04/08/20 0232 04/10/20 0311 04/11/20 0407 04/12/20 0350  WBC 7.4 7.4 7.1 6.6  HGB 11.8* 11.7* 11.5* 11.6*  HCT 36.7 35.6* 34.8* 35.0*  MCV 98.9 96.7 96.9 97.8  PLT 382 444* 408* 389   Cardiac Enzymes: No results for input(s): CKTOTAL, CKMB, CKMBINDEX, TROPONINI in the last 168 hours. BNP (last 3 results) No results for input(s): BNP in the last 8760 hours.  ProBNP  (last 3 results) No results for input(s): PROBNP in the last 8760 hours.  CBG: Recent Labs  Lab 04/13/20 1224 04/13/20 1540 04/13/20 1956 04/14/20 0005 04/14/20 0456  GLUCAP 175* 162* 119* 149* 121*    Recent Results (from the past 240 hour(s))  Culture, Urine     Status: None   Collection Time: 04/04/20  4:32 PM   Specimen: Urine, Random  Result Value Ref Range Status   Specimen Description URINE, RANDOM  Final   Special Requests NONE  Final   Culture   Final    NO GROWTH Performed at [facility], [address] [place], [address] [place]    Report Status 04/07/2020 FINAL  Final     Studies: No results found.  Scheduled Meds:  amLODipine  5 mg Per Tube Daily   chlorhexidine  15 mL Mouth Rinse BID   Chlorhexidine Gluconate Cloth  6 each Topical Daily   feeding supplement (PROSource TF)  45 mL Per Tube TID   free water  200 mL Per Tube Q6H   heparin  5,000 Units Subcutaneous Q8H   insulin aspart  0-9 Units Subcutaneous Q4H   insulin aspart  2 Units Subcutaneous Q4H   insulin detemir  5 Units Subcutaneous Daily   levothyroxine  112 mcg Per Tube QAC breakfast   liver oil-zinc oxide   Topical BID   mouth rinse  15 mL Mouth Rinse q12n4p   metaxalone  400 mg Per Tube TID   modafinil  200 mg Per Tube Daily   oxyCODONE  2.5 mg Per Tube Q6H   rosuvastatin  10 mg Per Tube Daily   sodium chloride flush  3 mL Intravenous Once   sodium chloride flush  5 mL Intracatheter Q8H   tiZANidine  2 mg Per Tube QHS   Followed by   19509 ON 04/15/2020] tiZANidine  2 mg Per Tube TID   Continuous Infusions:  sodium chloride 10 mL/hr at 03/24/20 0305   feeding supplement (GLUCERNA 1.2 CAL) 1,000 mL (04/14/20 0527)    Principal Problem:   Acute encephalopathy Active Problems:   Diabetes mellitus (HCC)   Hypothyroidism   History of embolic stroke   Acute bilateral thalamic infarction (HCC)   Dysphagia   CKD (chronic kidney disease), symptom  management only, stage 3 (moderate) (HCC)   Hydronephrosis, right   Group B streptococcal UTI   Uncontrolled type 2 diabetes mellitus with hyperglycemia, with long-term current use of insulin (HCC)   Chronic diastolic heart failure (HCC)   Essential hypertension   Physical debility   Left hemiplegia [facility])   Consultants: Neurology  Palliative care Nephrology Urology IR  Procedures: Right percutaneous nephrostomy 2/21  Antibiotics: Anti-infectives (From admission, onward)  Start     Dose/Rate Route Frequency Ordered Stop   04/07/20 2000  fluconazole (DIFLUCAN) 10 MG/ML suspension 100 mg        100 mg Per Tube Every evening 04/07/20 1236 04/08/20 2023   04/03/20 2000  fluconazole (DIFLUCAN) 40 MG/ML suspension 100 mg  Status:  Discontinued       "Followed by" Linked Group Details   100 mg Per Tube Every 24 hours 04/02/20 1752 04/07/20 1236   04/02/20 1900  fluconazole (DIFLUCAN) 40 MG/ML suspension 200 mg       "Followed by" Linked Group Details   200 mg Per Tube  Once 04/02/20 1752 04/02/20 2020   03/26/20 0730  cefTRIAXone (ROCEPHIN) 1 g in sodium chloride 0.9 % 100 mL IVPB        1 g 200 mL/hr over 30 Minutes Intravenous Every 24 hours 03/26/20 0644 03/30/20 0844   03/18/20 1900  azithromycin (ZITHROMAX) 500 mg in sodium chloride 0.9 % 250 mL IVPB  Status:  Discontinued        500 mg 250 mL/hr over 60 Minutes Intravenous Every 24 hours 03/18/20 1830 03/19/20 1106   03/18/20 1900  cefTRIAXone (ROCEPHIN) 2 g in sodium chloride 0.9 % 100 mL IVPB  Status:  Discontinued        2 g 200 mL/hr over 30 Minutes Intravenous Every 24 hours 03/18/20 1830 03/19/20 1106       Time spent: 30 minutes    Junious Silk ANP  [facility] 7 am - 330 pm/M-F for direct patient care and secure chat Please refer to Amion for contact info 27  days

## 2020-04-14 NOTE — Progress Notes (Signed)
  Speech Language Pathology Treatment: Dysphagia  Patient Details Name: Hannah Fischer MRN: [id number] DOB: Jan 31, 1933 Today's Date: 04/14/2020 Time: 6283-6629 SLP Time Calculation (min) (ACUTE ONLY): 10 min  Assessment / Plan / Recommendation Clinical Impression  Pt awake but does not open eyes, phonates minimally in response to yes/no questions. Will accept sips of water and ice cream with some initial swallowing occurring, but after a few sips orally holds bolus and eventually lets them spill anteriorally despite heaving cueing and positional assist. Pt not making progress with arousal to allow for increased PO intake. Will continue efforts while admitted.   HPI HPI: [name] is a 87 y.o. female with bilateral medial thalamic and punctate left superior cerebellar infarcts. PMHx significant for multiple CVAs with most recent acute/subacute R frontal lobe and L parietal and occipital lobe infarcts ~3 months ago, residual L-sided hemiplegia, hypothyroidism, GERD, HTN, TIA, DM, PE, anxiety, HTN, and HLD.      SLP Plan  Continue with current plan of care       Recommendations  Diet recommendations: NPO                Plan: Continue with current plan of care       GO                Hannah Fischer, Riley Nearing 04/14/2020, 1:07 PM

## 2020-04-14 NOTE — TOC Progression Note (Signed)
Transition of Care [facility] - Progression Note    Patient Details  Name: Hannah Fischer MRN: [id number] Date of Birth: January 30, 1933  Transition of Care [facility]) CM/SW Contact  Curlene Labrum, RN Phone Number: 04/14/2020, 2:20 PM  Clinical Narrative:    Case management met at the bedside this morning for transitions of care.  The patient will need SNF placement at [facility] once the patient is medically stable for discharge to [facility] facility - the patient's family is in agreement.  The patient is scheduled for PEG next week and will be able to transfer to the SNF once medically clear for discharge at a later date - possibly pending next week.  I called and spoke with Elyse Hsu, CM at [facility] and they are holding a room for the patient for admission.  I called [employer] and insurance Josem Kaufmann was started for next week after PEG surgery and clinicals were sent to [facility] at [phone] ref # [id number].  CM and MSW will continue to follow the patient for [facility] placement - most likely next week.   Expected Discharge Plan: [place] Barriers to Discharge: Continued Medical Work up (Patient scheduled for PEG placement on 04/17/2020)  Expected Discharge Plan and Services Expected Discharge Plan: [place] In-house Referral: Clinical Social Work,Hospice / Palliative Care Discharge Planning Services: CM Consult Post Acute Care Choice: [place] arrangements for the past 2 months: [place]                                       Social Determinants of Health (SDOH) Interventions    Readmission Risk Interventions Readmission Risk Prevention Plan 04/13/2020  Transportation Screening Complete  Medication Review Press photographer) Complete  PCP or Specialist appointment within 3-5 days of discharge Complete  HRI or Home Care Consult Complete  SW Recovery Care/Counseling Consult Complete  Palliative Care  Screening Complete  Skilled Nursing Facility Complete  Some recent data might be hidden

## 2020-04-14 NOTE — Plan of Care (Signed)
  Problem: Education: Goal: Knowledge of General Education information will improve Description: Including pain rating scale, medication(s)/side effects and non-pharmacologic comfort measures Outcome: Progressing   Problem: Health Behavior/Discharge Planning: Goal: Ability to manage health-related needs will improve Outcome: Progressing   Problem: Clinical Measurements: Goal: Ability to maintain clinical measurements within normal limits will improve Outcome: Progressing Goal: Will remain free from infection Outcome: Progressing Goal: Diagnostic test results will improve Outcome: Progressing Goal: Respiratory complications will improve Outcome: Progressing Goal: Cardiovascular complication will be avoided Outcome: Progressing   Problem: Activity: Goal: Risk for activity intolerance will decrease Outcome: Progressing   Problem: Nutrition: Goal: Adequate nutrition will be maintained Outcome: Progressing   Problem: Coping: Goal: Level of anxiety will decrease Outcome: Progressing   Problem: Elimination: Goal: Will not experience complications related to bowel motility Outcome: Progressing Goal: Will not experience complications related to urinary retention Outcome: Progressing   Problem: Pain Managment: Goal: General experience of comfort will improve Outcome: Progressing   Problem: Safety: Goal: Ability to remain free from injury will improve Outcome: Progressing   Problem: Skin Integrity: Goal: Risk for impaired skin integrity will decrease Outcome: Progressing   Problem: Safety: Goal: Non-violent Restraint(s) Outcome: Progressing   Problem: Education: Goal: Knowledge of disease or condition will improve Outcome: Progressing Goal: Knowledge of secondary prevention will improve Outcome: Progressing Goal: Knowledge of patient specific risk factors addressed and post discharge goals established will improve Outcome: Progressing Goal: Individualized  Educational Video(s) Outcome: Progressing   Problem: Coping: Goal: Will verbalize positive feelings about self Outcome: Progressing   Problem: Health Behavior/Discharge Planning: Goal: Ability to manage health-related needs will improve Outcome: Progressing

## 2020-04-14 NOTE — Progress Notes (Signed)
Physical Therapy Treatment Patient Details Name: Hannah Fischer MRN: [id number] DOB: 04-15-1932 Today's Date: 04/14/2020    History of Present Illness Pt is an 87 y/o female admitted 03/18/20 with CVA. Imaging (+) for bilateral medial thalamic and punctate infarcts and L superior cerebellar infarcts. PMH significant for multiple CVAs with most recent ~3 months ago, residual L-sided hemiplegia with recent d/c from outpatient PT/OT with return to baseline level of function, TIA, DM, PE, HTN.    PT Comments    Pt progressing slowly towards physical therapy goals. Pt did open eyes for the first time during therapy sessions this week. Pt following minimal commands, did hold RUE up in the air with cues, and holding herself steady EOB with min guard assist and no external support. Husband present at end of session and questions answered. Will continue to follow and progress as able per POC.     Follow Up Recommendations  SNF;Supervision/Assistance - 24 hour     Equipment Recommendations  Wheelchair cushion (measurements PT);Wheelchair (measurements PT);Hospital bed    Recommendations for Other Services       Precautions / Restrictions Precautions Precautions: Fall Precaution Comments: cortrak, nephrostomy drain, at risk for skin breakdown Restrictions Weight Bearing Restrictions: No    Mobility  Bed Mobility Overal bed mobility: Needs Assistance Bed Mobility: Rolling Rolling: Total assist;+2 for physical assistance Sidelying to sit: Total assist;+2 for physical assistance;HOB elevated Supine to sit: Total assist;+2 for physical assistance;+2 for safety/equipment Sit to supine: Total assist;+2 for physical assistance;+2 for safety/equipment   General bed mobility comments: +2 assist required for all aspects of bed mobility.       Modified Rankin (Stroke Patients Only) Modified Rankin (Stroke Patients Only) Pre-Morbid Rankin Score: No significant disability Modified Rankin:  Severe disability     Balance Overall balance assessment: Needs assistance Sitting-balance support: Feet unsupported;No upper extremity supported Sitting balance-Leahy Scale: Poor Sitting balance - Comments: Not tested this date Postural control: Right lateral lean;Posterior lean                                  Cognition Arousal/Alertness: Lethargic Behavior During Therapy: Flat affect Overall Cognitive Status: Impaired/Different from baseline Area of Impairment: Orientation;Attention;Following commands;Awareness;Problem solving                 Orientation Level: Disoriented to;Time;Situation;Person;Place Current Attention Level: Focused Memory: Decreased short-term memory;Decreased recall of precautions Following Commands: Follows one step commands inconsistently;Follows multi-step commands inconsistently Safety/Judgement: Decreased awareness of safety;Decreased awareness of deficits Awareness: Intellectual Problem Solving: Slow processing;Decreased initiation;Requires verbal cues;Requires tactile cues;Difficulty sequencing General Comments: Level of arousal remains unchanged. Patient continues to keep eyes closed throughout session. Does not attempt to assist therapist with repositioning in supine. Patient does appear to resist movement in RUE this date.      Exercises Other Exercises Other Exercises: Pt with lateral lean to R and L. Pt engaging shoulder girdle and stabilizing while leaning onto her side. Assist required to push back up to midline. Other Exercises: anterior lean x5 Other Exercises: pec stretch 3x30" with cervical extension to neutral    General Comments        Pertinent Vitals/Pain Pain Assessment: Faces Faces Pain Scale: Hurts a little bit Pain Location: w/ PROM at L hand and cervical neck Pain Descriptors / Indicators: Grimacing Pain Intervention(s): Limited activity within patient's tolerance;Monitored during session;Repositioned     Home Living  Prior Function            PT Goals (current goals can now be found in the care plan section) Acute Rehab PT Goals Patient Stated Goal: Unable to state. PT Goal Formulation: Patient unable to participate in goal setting Time For Goal Achievement: 04/17/20 Potential to Achieve Goals: Fair Progress towards PT goals: Progressing toward goals    Frequency    Min 3X/week      PT Plan Current plan remains appropriate    Co-evaluation              AM-PAC PT "6 Clicks" Mobility   Outcome Measure  Help needed turning from your back to your side while in a flat bed without using bedrails?: Total Help needed moving from lying on your back to sitting on the side of a flat bed without using bedrails?: Total Help needed moving to and from a bed to a chair (including a wheelchair)?: Total Help needed standing up from a chair using your arms (e.g., wheelchair or bedside chair)?: Total Help needed to walk in hospital room?: Total Help needed climbing 3-5 steps with a railing? : Total 6 Click Score: 6    End of Session Equipment Utilized During Treatment: Oxygen Activity Tolerance: Patient limited by lethargy Patient left: in bed;with call bell/phone within reach;with bed alarm set;with SCD's reapplied Nurse Communication: Mobility status;Need for lift equipment PT Visit Diagnosis: Other symptoms and signs involving the nervous system (R29.898);Difficulty in walking, not elsewhere classified (R26.2);Unsteadiness on feet (R26.81);Muscle weakness (generalized) (M62.81)     Time: 6384-6659 PT Time Calculation (min) (ACUTE ONLY): 34 min  Charges:  $Therapeutic Activity: 8-22 mins $Neuromuscular Re-education: 8-22 mins                     Conni Slipper, PT, DPT Acute Rehabilitation Services Pager: [phone] Office: [phone]    Marylynn Pearson 04/14/2020, 2:35 PM

## 2020-04-15 DIAGNOSIS — I10 Essential (primary) hypertension: Secondary | ICD-10-CM | POA: Diagnosis not present

## 2020-04-15 DIAGNOSIS — Z7189 Other specified counseling: Secondary | ICD-10-CM

## 2020-04-15 DIAGNOSIS — Z515 Encounter for palliative care: Secondary | ICD-10-CM

## 2020-04-15 DIAGNOSIS — Z66 Do not resuscitate: Secondary | ICD-10-CM

## 2020-04-15 LAB — GLUCOSE, CAPILLARY
Glucose-Capillary: 100 mg/dL — ABNORMAL HIGH (ref 70–99)
Glucose-Capillary: 117 mg/dL — ABNORMAL HIGH (ref 70–99)
Glucose-Capillary: 119 mg/dL — ABNORMAL HIGH (ref 70–99)
Glucose-Capillary: 124 mg/dL — ABNORMAL HIGH (ref 70–99)
Glucose-Capillary: 126 mg/dL — ABNORMAL HIGH (ref 70–99)
Glucose-Capillary: 128 mg/dL — ABNORMAL HIGH (ref 70–99)

## 2020-04-15 LAB — CBC WITH DIFFERENTIAL/PLATELET
Abs Immature Granulocytes: 0.04 10*3/uL (ref 0.00–0.07)
Basophils Absolute: 0.1 10*3/uL (ref 0.0–0.1)
Basophils Relative: 1 %
Eosinophils Absolute: 0.5 10*3/uL (ref 0.0–0.5)
Eosinophils Relative: 8 %
HCT: 34.6 % — ABNORMAL LOW (ref 36.0–46.0)
Hemoglobin: 11.4 g/dL — ABNORMAL LOW (ref 12.0–15.0)
Immature Granulocytes: 1 %
Lymphocytes Relative: 22 %
Lymphs Abs: 1.4 10*3/uL (ref 0.7–4.0)
MCH: 32.4 pg (ref 26.0–34.0)
MCHC: 32.9 g/dL (ref 30.0–36.0)
MCV: 98.3 fL (ref 80.0–100.0)
Monocytes Absolute: 0.7 10*3/uL (ref 0.1–1.0)
Monocytes Relative: 10 %
Neutro Abs: 3.7 10*3/uL (ref 1.7–7.7)
Neutrophils Relative %: 58 %
Platelets: 337 10*3/uL (ref 150–400)
RBC: 3.52 MIL/uL — ABNORMAL LOW (ref 3.87–5.11)
RDW: 13.3 % (ref 11.5–15.5)
WBC: 6.3 10*3/uL (ref 4.0–10.5)
nRBC: 0 % (ref 0.0–0.2)

## 2020-04-15 LAB — COMPREHENSIVE METABOLIC PANEL
ALT: 29 U/L (ref 0–44)
AST: 60 U/L — ABNORMAL HIGH (ref 15–41)
Albumin: 2.6 g/dL — ABNORMAL LOW (ref 3.5–5.0)
Alkaline Phosphatase: 143 U/L — ABNORMAL HIGH (ref 38–126)
Anion gap: 8 (ref 5–15)
BUN: 66 mg/dL — ABNORMAL HIGH (ref 8–23)
CO2: 30 mmol/L (ref 22–32)
Calcium: 9.9 mg/dL (ref 8.9–10.3)
Chloride: 98 mmol/L (ref 98–111)
Creatinine, Ser: 1.29 mg/dL — ABNORMAL HIGH (ref 0.44–1.00)
GFR, Estimated: 41 mL/min — ABNORMAL LOW (ref >60)
Glucose, Bld: 112 mg/dL — ABNORMAL HIGH (ref 70–99)
Potassium: 4.5 mmol/L (ref 3.5–5.1)
Sodium: 136 mmol/L (ref 135–145)
Total Bilirubin: 0.2 mg/dL — ABNORMAL LOW (ref 0.3–1.2)
Total Protein: 6.2 g/dL — ABNORMAL LOW (ref 6.5–8.1)

## 2020-04-15 NOTE — Progress Notes (Signed)
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  Palliative care was consulted to aid in goals of care conversations.  Today's Discussion (04/15/2020):  *Please note that this is a verbal dictation therefore any spelling or grammatical errors are due to the "[place]" system interpretation.  Chart reviewed.   I assessed Karielle this afternoon, she was not responsive to me.   I met with [name] at bedside. We discussed Yajahira's very poor physical and cognitive state. I expressed my ongoing concerns that she may not improve greatly from here. He shares that it has been a very difficult road for him like a "roller coaster". We reviewed that Lamiracle has been for for twenty-eight days and shown little improvement. She has been in the bed the whole time depleting her muscular strength.   I expresses that even if recovery were possible it is going to be a very very long road and that Amyriah will likely not be as functional as she was prior by any measure. [name] expresses that he questions if placing a G-tube is the right decision but he cannot comprehend the idea of Evella no longer being with [place]. I expressed that we need to consider the quality of the life Shakirra is living as opposed to just have her continuing to live. [name] heard and understood this but believes that pursuing a PEG is the best next step.   At present the plan will be for PEG placement.  Objective Assessment: Vital Signs Vitals:   04/15/20 1050 04/15/20 1546  BP: (!) 114/59 121/72  Pulse: 69 89  Resp:  14 18  Temp: 98.6 F (37 C) 98.9 F (37.2 C)  SpO2: 96% (!) 87%    Intake/Output Summary (Last 24 hours) at 04/15/2020 1635 Last data filed at 04/15/2020 1045 Gross per 24 hour  Intake -  Output 1150 ml  Net -1150 ml   Last Weight  Most recent update: 04/08/2020  4:23 AM   Weight  80 kg (176 lb 5.9 oz)           Gen:  Elderly F in NAD HEENT: Coretrack in place, dry mucous membranes CV: Irregular rate and rhythm  PULM: 1LPM [place] ABD: soft/nontender  EXT: No edema  Neuro:  Somnolent this afternoon  SUMMARY OF RECOMMENDATIONS DNAR/DNI  MOST/GOLD DNR on the chart and in [place] for PEG Placement on Monday  Patients husband would like for her to go to [facility] in [place] has been updated on this request  Patient will need comprehensive OP Palliative follow up  Time Spent:  25 minutes Greater than 50% of the time was spent in counseling and coordination of care ______________________________________________________________________________________ [place] Team Team Cell Phone: [phone] Please utilize secure chat with additional questions, if there is no response within 30 minutes please call the above phone number  Palliative Medicine Team providers are available by phone from 7am to 7pm daily and can be reached through the team cell phone.  Should this patient require assistance outside of these hours, please call the patient's attending physician.

## 2020-04-15 NOTE — Progress Notes (Signed)
Pt was bladder scanned as per MD order and yielded ,  I & O was completed and drained 800 ml, post void 0, emptied  125 ml from the nephrostomy bag, will pass on to oncoming shift, to keep monitoring and follow MD orders

## 2020-04-15 NOTE — Progress Notes (Signed)
[facility] PROGRESS NOTE  Hannah Fischer ZOX:[id number] DOB: 06/19/1932 DOA: 03/18/2020 PCP: Hannah HazelMiller, Lisa, MD  Status: Remains inpatient appropriate because:Altered mental status, Unsafe d/c plan and Inpatient level of care appropriate due to severity of illness   Dispo: The patient is from: Home              Anticipated d/c is to: SNF              Patient currently is not medically stable to d/c.               Barriers to discharge: Patient very slow to progress with PT and OT and unclear if she is an appropriate rehab candidate for SNF or will need long-term placement.  Had PEG tube placed on 3/8.  Family has confirmed that they do not have the resources to pay for skilled nursing facility services out-of-pocket once insurance benefits exhausted.   Difficult to place patient Yes   Level of care: Progressive  Code Status: DNR Family Communication:/10 Dtr in Development worker, international aidlaw [place] by phone per myself and CM Hannah DusterMichelle DVT prophylaxis: SQ heparin Vaccination status: Unknown  Foley catheter: No  HPI: 87 year old lady with hypertension, hyperlipidemia, diabetes mellitus type 2, embolic CVAs with residual left hand weakness, hypothyroidism, GERD, PE who presentedto the hospital for increased lethargy. Shehad gone to bedin the morning her husbandhad trouble waking her up. In the ED she was noted to be responsive to pain. Hannah Fischer code stroke was called. She was outside the window for TPA. An emergent CT did not show any acute abnormality. Hannah Fischer CTA was performed and did not reveal any obvious stenosis. An MRI of the brain was performed on 2/13:Punctate acute infarction in the left superior cerebellumadjacent to the superior cerebellar peduncle. Bilateral thalamicacute infarction extending back into the mid brain. This is consistent with artery of [place]heron infarction.She continues to wax and wane from Hannah Fischer mental status standpoint. She has cortrak. Palliative and neuro are following, family  eventually decided for PEG which is now pending Plavix washout  Subjective: Unresponsive, does not awaken today for me  Objective: Vitals:   04/15/20 1050 04/15/20 1546  BP: (!) 114/59 121/72  Pulse: 69 89  Resp: 14 18  Temp: 98.6 F (37 C) 98.9 F (37.2 C)  SpO2: 96% (!) 87%    Intake/Output Summary (Last 24 hours) at 04/15/2020 1643 Last data filed at 04/15/2020 1045 Gross per 24 hour  Intake --  Output 1150 ml  Net -1150 ml   Filed Weights   04/04/20 0438 04/05/20 0500 04/08/20 0423  Weight: 81.8 kg 79.5 kg 80 kg    Exam:  General: No acute distress. Cardiovascular: Heart sounds show Hannah Fischer regular rate, and rhythm Lungs: Clear to auscultation bilaterally Abdomen: Soft, nontender, nondistended - cortrack in place Neurological: withdraws from painful stimuli, does not meaningfully respond to verbal stimuli - appears to be attempting to open eyes at end of our interaction  Skin: Warm and dry. No rashes or lesions. Extremities: No clubbing or cyanosis. No edema.     Assessment/Plan: Acute problems: Bilateral Thalamic Infarcts Persistent acute metabolic encephalopathy  -due to CVA, mental status waxing and waning, at times she is minimally responsive versus talking some.  Neurology following -Initially started on Provigil with gradual up titration but unfortunately developed tremulousness and dose was discontinued.   -She had also been given Hannah Fischer trial of amantadine but this was discontinued.   -She has Hannah Fischer prior history of embolic infarcts with imaging on admission  showed punctate acute infarction in the left superior cerebellum adjacent to the superior cerebellar peduncle.  Bilateral thalamic acute infarction extending back into the midbrain, consistent with artery of Percheron infarction.    -She had an event with asymmetric pupils on 2/25, had Hannah Fischer repeat MRI without any new acute findings and expected evolution of known infarcts.   EEG 2/13 with moderate diffuse encephalopathy  no apparent underlying seizure activity -Continue statin, Plavix on hold for anticipated PEG tube placement  Hypertonicity and spasticity secondary to recent brain injury from stroke -Patient having significant hypertonicity of lower extremities which preclude OT and PT working effectively with her -She is also having spasticity related tremors in the upper extremities also impeding ability to participate with therapy -In addition with severe hypertonicity appears to be causing the patient pain -3/10 initiate low-dose Skelaxin 400 mg 3 times daily.  Baclofen likely would be Aithana Kushner better agent in regards to managing spasticity but given her advanced age and recent stroke she is at risk for having hallucinations/acute delirium from this medication -3/10 began tizanidine 2 mg at bedtime x2 doses and then increase to 3 times daily -3/10 began low-dose OxyIR 2.5 mg scheduled every 6 hours -will need to follow response closely -Will need to discuss with Dr. Hermelinda Fischer on Monday   Goals of care discussion  -with palliative team x2 last interaction on 3/7.  At that time despite being made aware of patient's poor status and poor prognosis her husband reported to the palliative provider that he was just not ready to let her go.  Her husband did wish for PEG placement now tentatively scheduled for 3/14 after washout of Plavix -Husband is agreeable to outpatient palliative provider follow-up in the event his wife further declines and would need referral to hospice care -Dtr in law states husband told by neurology that if pt were to improve and fully awaken it would take at least 2 months  Dysphagia/Nutrition Nutrition Status: Nutrition Problem: Inadequate oral intake Etiology: inability to eat Signs/Symptoms: NPO status Interventions: Tube feeding,Prostat Estimated body mass index is 31.24 kg/m as calculated from the following:   Height as of this encounter: 5\' 3"  (1.6 m).   Weight as of this encounter: 80  kg. -Remains lethargic with most recent SLP evaluation on 3/7 recommended continued n.p.o. status-continue cortrack for tube feedings - Awaiting PEG 3/14  CKD 3A- AKI due to dehydration  Right Sided Hydronephrosis  -Baseline creatinine ~ 1.0-1.3 -Continue free water per tube and watching I & O closely to prevent heart failure exacerbation-on 3/10 creatinine had gone trending upward now 1.4.  Free water increased from 100 cc to 200 cc every 6 hours with creatinine on 3/11. -Earlier in the hospitalization due to steady rise of creatinine arenal ultrasound completed whichshowed right sided hydronephrosis(no mass or definite calcification identified).  Nephrology, urology consulted and she is status post right nephrostomy tube placement by IR on 2/21.  -3/10 Issues w/ mild urinary retention overnight requiring I/O cath-follow bladder scans -recurrent issues overnight on 3/11-12 -> may need foley placement if continuing to retain   Diabetes mellitus 2 on current long-term insulin with hyperglycemia -Continue on insulin sliding scale every 4 hours while she is on tube feeds -basal insulin, bolus insulin - adjust prn -CBGs have been between 110 and 164 over the past 24 hours -HgbA1c = 7.6  Hypertension/chronic grade 2 diastolic heart failure -2D echoperformedduring this admission reveals grade 2 diastolic dysfunction -Remains euvolemic although overall net INO less than 10,000 cc  since admission -Continue Norvasc  Physical deconditioning/residual left hemiplegia from prior stroke PT eval 3/11: Pt progressing slowly towards physical therapy goals. Pt did open eyes for the first time during therapy sessions this week. Pt following minimal commands, did hold RUE up in the air with cues, and holding herself steady EOB with min guard assist and no external support. Husband present at end of session and questions answered. Will continue to follow and progress as able per POC.     Other  problems: Group B strep UTI/recent fevers -GBS UTI from 2/12 appears to have been treated with ceftriaxone. 2/27 yeast UTI, completed fluconazole, WBC remains normal this morning  -Fever on 2/27, CXR with bibasilar opacities, urine culture showed yeast and completed fluconazole. Blood cultures1/2 with staph epidermidis (likely contaminated) -Low-grade temp 100.2 as of 3/5 overnight, afebrile since. Closely monitor  Intermittent agitation  -Has had issues with agitation when awake but for the most part remains lethargic -Continue liberalized visitation so family can sit with her  Gluteal skin breakdown -[place] and barrier cream -Continue air mattress to prevent further skin breakdown -turn as able Wound / Incision (Open or Dehisced) 03/27/20 Puncture Flank Right;Posterior nephrostomy tube placement insertion site (Active)  Date First Assessed/Time First Assessed: 03/27/20 1406   Wound Type: Puncture  Location: Flank  Location Orientation: Right;Posterior  Wound Description (Comments): nephrostomy tube placement insertion site  Present on Admission: No    Assessments 03/27/2020  8:09 AM 04/14/2020  8:00 AM  Dressing Type Gauze (Comment) Gauze (Comment)  Dressing Changed New --  Dressing Status Clean;Dry;Intact Clean;Dry;Intact  Dressing Change Frequency -- Daily  Site / Wound Assessment Clean;Dry Dressing in place / Unable to assess     No Linked orders to display    Hypothyroidism -Continue Synthroid per tube -TSH 6.651 on 2/12 and likely an acute phase reactant consistent with sick euthyroid syndrome -TSH with T3 and T4 will need to be repeated in May 2022  Hyperkalemia Resolved -Potassium normal   Data Reviewed: Basic Metabolic Panel: Recent Labs  Lab 04/11/20 0407 04/12/20 0350 04/13/20 0358 04/14/20 0355 04/15/20 0258  NA 136 139 141 138 136  K 4.9 4.6 4.3 4.3 4.5  CL 96* 100 100 100 98  CO2 28 26 31 28 30   GLUCOSE 188* 140* 136* 124* 112*  BUN 62* 69* 64* 67*  66*  CREATININE 1.31* 1.36* 1.40* 1.34* 1.29*  CALCIUM 10.3 10.2 10.3 10.1 9.9   Liver Function Tests: Recent Labs  Lab 04/10/20 0311 04/13/20 0358 04/15/20 0258  AST 23 23 60*  ALT 15 17 29   ALKPHOS 95 92 143*  BILITOT 0.7 0.6 0.2*  PROT 6.5 6.5 6.2*  ALBUMIN 2.7* 2.6* 2.6*   No results for input(s): LIPASE, AMYLASE in the last 168 hours. No results for input(s): AMMONIA in the last 168 hours. CBC: Recent Labs  Lab 04/10/20 0311 04/11/20 0407 04/12/20 0350 04/15/20 0258  WBC 7.4 7.1 6.6 6.3  NEUTROABS  --   --   --  3.7  HGB 11.7* 11.5* 11.6* 11.4*  HCT 35.6* 34.8* 35.0* 34.6*  MCV 96.7 96.9 97.8 98.3  PLT 444* 408* 389 337   Cardiac Enzymes: No results for input(s): CKTOTAL, CKMB, CKMBINDEX, TROPONINI in the last 168 hours. BNP (last 3 results) No results for input(s): BNP in the last 8760 hours.  ProBNP (last 3 results) No results for input(s): PROBNP in the last 8760 hours.  CBG: Recent Labs  Lab 04/14/20 2306 04/15/20 0330 04/15/20 0718 04/15/20  1110 04/15/20 1547  GLUCAP 103* 100* 124* 117* 119*    No results found for this or any previous visit (from the past 240 hour(s)).   Studies: No results found.  Scheduled Meds: . amLODipine  5 mg Per Tube Daily  . chlorhexidine  15 mL Mouth Rinse BID  . Chlorhexidine Gluconate Cloth  6 each Topical Daily  . feeding supplement (PROSource TF)  45 mL Per Tube TID  . free water  200 mL Per Tube Q6H  . heparin  5,000 Units Subcutaneous Q8H  . insulin aspart  0-9 Units Subcutaneous Q4H  . insulin aspart  2 Units Subcutaneous Q4H  . insulin detemir  5 Units Subcutaneous Daily  . levothyroxine  112 mcg Per Tube QAC breakfast  . liver oil-zinc oxide   Topical BID  . mouth rinse  15 mL Mouth Rinse q12n4p  . metaxalone  400 mg Per Tube TID  . modafinil  200 mg Per Tube Daily  . oxyCODONE  2.5 mg Per Tube Q6H  . rosuvastatin  10 mg Per Tube Daily  . sodium chloride flush  3 mL Intravenous Once  . sodium  chloride flush  5 mL Intracatheter Q8H  . tiZANidine  2 mg Per Tube TID   Continuous Infusions: . sodium chloride 10 mL/hr at 03/24/20 0305  . feeding supplement (GLUCERNA 1.2 CAL) 1,000 mL (04/15/20 0504)    Principal Problem:   Acute encephalopathy Active Problems:   Diabetes mellitus (HCC)   Hypothyroidism   History of embolic stroke   Acute bilateral thalamic infarction (HCC)   Dysphagia   CKD (chronic kidney disease), symptom management only, stage 3 (moderate) (HCC)   Hydronephrosis, right   Group B streptococcal UTI   Uncontrolled type 2 diabetes mellitus with hyperglycemia, with long-term current use of insulin (HCC)   Chronic diastolic heart failure (HCC)   Essential hypertension   Physical debility   Left hemiplegia [facility])   Consultants: Neurology  Palliative care Nephrology Urology IR  Procedures: Right percutaneous nephrostomy 2/21  Antibiotics: Anti-infectives (From admission, onward)   Start     Dose/Rate Route Frequency Ordered Stop   04/07/20 2000  fluconazole (DIFLUCAN) 10 MG/ML suspension 100 mg        100 mg Per Tube Every evening 04/07/20 1236 04/08/20 2023   04/03/20 2000  fluconazole (DIFLUCAN) 40 MG/ML suspension 100 mg  Status:  Discontinued       "Followed by" Linked Group Details   100 mg Per Tube Every 24 hours 04/02/20 1752 04/07/20 1236   04/02/20 1900  fluconazole (DIFLUCAN) 40 MG/ML suspension 200 mg       "Followed by" Linked Group Details   200 mg Per Tube  Once 04/02/20 1752 04/02/20 2020   03/26/20 0730  cefTRIAXone (ROCEPHIN) 1 g in sodium chloride 0.9 % 100 mL IVPB        1 g 200 mL/hr over 30 Minutes Intravenous Every 24 hours 03/26/20 0644 03/30/20 0844   03/18/20 1900  azithromycin (ZITHROMAX) 500 mg in sodium chloride 0.9 % 250 mL IVPB  Status:  Discontinued        500 mg 250 mL/hr over 60 Minutes Intravenous Every 24 hours 03/18/20 1830 03/19/20 1106   03/18/20 1900  cefTRIAXone (ROCEPHIN) 2 g in sodium chloride 0.9 % 100  mL IVPB  Status:  Discontinued        2 g 200 mL/hr over 30 Minutes Intravenous Every 24 hours 03/18/20 1830 03/19/20 1106  Time spent: 30 minutes    Lacretia Nicks ANP  [facility] 7 am - 330 pm/M-F for direct patient care and secure chat Please refer to Amion for contact info 28  days

## 2020-04-16 DIAGNOSIS — I10 Essential (primary) hypertension: Secondary | ICD-10-CM | POA: Diagnosis not present

## 2020-04-16 LAB — COMPREHENSIVE METABOLIC PANEL
ALT: 89 U/L — ABNORMAL HIGH (ref 0–44)
AST: 164 U/L — ABNORMAL HIGH (ref 15–41)
Albumin: 2.7 g/dL — ABNORMAL LOW (ref 3.5–5.0)
Alkaline Phosphatase: 329 U/L — ABNORMAL HIGH (ref 38–126)
Anion gap: 9 (ref 5–15)
BUN: 63 mg/dL — ABNORMAL HIGH (ref 8–23)
CO2: 29 mmol/L (ref 22–32)
Calcium: 9.8 mg/dL (ref 8.9–10.3)
Chloride: 98 mmol/L (ref 98–111)
Creatinine, Ser: 1.26 mg/dL — ABNORMAL HIGH (ref 0.44–1.00)
GFR, Estimated: 42 mL/min — ABNORMAL LOW (ref >60)
Glucose, Bld: 86 mg/dL (ref 70–99)
Potassium: 5 mmol/L (ref 3.5–5.1)
Sodium: 136 mmol/L (ref 135–145)
Total Bilirubin: 0.9 mg/dL (ref 0.3–1.2)
Total Protein: 6.4 g/dL — ABNORMAL LOW (ref 6.5–8.1)

## 2020-04-16 LAB — GLUCOSE, CAPILLARY
Glucose-Capillary: 108 mg/dL — ABNORMAL HIGH (ref 70–99)
Glucose-Capillary: 121 mg/dL — ABNORMAL HIGH (ref 70–99)
Glucose-Capillary: 124 mg/dL — ABNORMAL HIGH (ref 70–99)
Glucose-Capillary: 125 mg/dL — ABNORMAL HIGH (ref 70–99)
Glucose-Capillary: 126 mg/dL — ABNORMAL HIGH (ref 70–99)
Glucose-Capillary: 137 mg/dL — ABNORMAL HIGH (ref 70–99)

## 2020-04-16 LAB — CBC WITH DIFFERENTIAL/PLATELET
Abs Immature Granulocytes: 0.04 10*3/uL (ref 0.00–0.07)
Basophils Absolute: 0.1 10*3/uL (ref 0.0–0.1)
Basophils Relative: 1 %
Eosinophils Absolute: 0.4 10*3/uL (ref 0.0–0.5)
Eosinophils Relative: 6 %
HCT: 33.7 % — ABNORMAL LOW (ref 36.0–46.0)
Hemoglobin: 11.4 g/dL — ABNORMAL LOW (ref 12.0–15.0)
Immature Granulocytes: 1 %
Lymphocytes Relative: 16 %
Lymphs Abs: 1.1 10*3/uL (ref 0.7–4.0)
MCH: 32.9 pg (ref 26.0–34.0)
MCHC: 33.8 g/dL (ref 30.0–36.0)
MCV: 97.1 fL (ref 80.0–100.0)
Monocytes Absolute: 0.6 10*3/uL (ref 0.1–1.0)
Monocytes Relative: 9 %
Neutro Abs: 4.5 10*3/uL (ref 1.7–7.7)
Neutrophils Relative %: 67 %
Platelets: 356 10*3/uL (ref 150–400)
RBC: 3.47 MIL/uL — ABNORMAL LOW (ref 3.87–5.11)
RDW: 13.4 % (ref 11.5–15.5)
WBC: 6.7 10*3/uL (ref 4.0–10.5)
nRBC: 0.3 % — ABNORMAL HIGH (ref 0.0–0.2)

## 2020-04-16 LAB — SURGICAL PCR SCREEN
MRSA, PCR: NEGATIVE
Staphylococcus aureus: POSITIVE — AB

## 2020-04-16 LAB — MAGNESIUM: Magnesium: 2.3 mg/dL (ref 1.7–2.4)

## 2020-04-16 LAB — PHOSPHORUS: Phosphorus: 4.3 mg/dL (ref 2.5–4.6)

## 2020-04-16 NOTE — Progress Notes (Addendum)
Pt was Bladder scanned as per MD order and urine residual was noted to be above 500 ml, In and Out was completed, yielding 650 ml, this is the 3rd  In and Out according to nursing reports, hence  will need MD advise after this for further actions,  will continue to monitor and pass to oncoming shift.

## 2020-04-16 NOTE — Progress Notes (Signed)
[facility] PROGRESS NOTE  Hannah Fischer ATF:[id number] DOB: 03/01/1932 DOA: 03/18/2020 PCP: Sigmund Hazel, MD  Status: Remains inpatient appropriate because:Altered mental status, Unsafe d/c plan and Inpatient level of care appropriate due to severity of illness   Dispo: The patient is from: Home              Anticipated d/c is to: SNF              Patient currently is not medically stable to d/c.               Barriers to discharge: Patient very slow to progress with PT and OT and unclear if she is an appropriate rehab candidate for SNF or will need long-term placement.  Had PEG tube placed on 3/8.  Family has confirmed that they do not have the resources to pay for skilled nursing facility services out-of-pocket once insurance benefits exhausted.   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 Shehad gone to bedin the morning her husbandhad trouble waking her up. In the ED she was noted to be responsive to pain. Hannah Fischer code stroke was called. She was outside the window for TPA. An emergent CT did not show any acute abnormality. Hannah Fischer CTA was performed and did not reveal any obvious stenosis. An MRI of the brain was performed on 2/13:Punctate acute infarction in the left superior cerebellumadjacent to the superior cerebellar peduncle. Bilateral thalamicacute infarction extending back into the mid brain. This is consistent with artery of Percheron infarction.She continues to wax and wane from Hannah Fischer mental status standpoint. She has cortrak. Palliative and neuro are following, family eventually decided for PEG which is now pending  Plavix washout  Subjective: unresponsive  Objective: Vitals:   04/16/20 1131 04/16/20 1534  BP: 140/82 127/62  Pulse: 79 79  Resp: 18 20  Temp: 98.4 F (36.9 C) 99.6 F (37.6 C)  SpO2: 98% 97%    Intake/Output Summary (Last 24 hours) at 04/16/2020 1826 Last data filed at 04/16/2020 0940 Gross per 24 hour  Intake 40 ml  Output 900 ml  Net -860 ml   Filed Weights   04/04/20 0438 04/05/20 0500 04/08/20 0423  Weight: 81.8 kg 79.5 kg 80 kg    Exam:  General: No acute distress. Cardiovascular: Heart sounds show Hannah Fischer regular rate, and rhythm.  Lungs: Clear to auscultation bilaterally Abdomen: Soft, nontender, nondistended - NG in place Neurological: unresponsives, withdraws from pain to all extremities Skin: Warm and dry. No rashes or lesions. Extremities: No clubbing or cyanosis. No edema.  Assessment/Plan: Acute problems: Bilateral Thalamic Infarcts Persistent acute metabolic encephalopathy  -due to CVA, mental status waxing and waning, at times she is minimally responsive versus talking some.  Neurology following -Initially started on Provigil with gradual up titration but unfortunately developed tremulousness and dose was discontinued.   -She had also been given Hannah Fischer trial of amantadine but this was discontinued.   -She has Hannah Fischer prior history of embolic infarcts with imaging on admission showed punctate acute infarction in the left superior cerebellum adjacent to the superior cerebellar peduncle.  Bilateral thalamic acute infarction extending back into the midbrain, consistent with artery of Percheron infarction.    -She had  an event with asymmetric pupils on 2/25, had [name] repeat MRI without any new acute findings and expected evolution of known infarcts.   EEG 2/13 with moderate diffuse encephalopathy no apparent underlying seizure activity -Continue statin, Plavix on hold for anticipated PEG tube placement - hold statin with rising LFT's, resume when able   Hypertonicity and  spasticity secondary to recent brain injury from stroke -Patient having significant hypertonicity of lower extremities which preclude OT and PT working effectively with her -She is also having spasticity related tremors in the upper extremities also impeding ability to participate with therapy -In addition with severe hypertonicity appears to be causing the patient pain -3/10 initiate low-dose Skelaxin 400 mg 3 times daily.  Baclofen likely would be Hannah Fischer better agent in regards to managing spasticity but given her advanced age and recent stroke she is at risk for having hallucinations/acute delirium from this medication -hold tizanidine and skelaxin with bump in LFT's (continue to monitor) -3/10 began low-dose OxyIR 2.5 mg scheduled every 6 hours -will need to follow response closely -Will need to discuss with Dr. Hermelinda MedicusSchwartz on Monday   Elevated LFT's Rising, suspect related to medications - tizanidine can cause bump in LFT's, skelaxin has known adverse effect of jaundice and is contraindicated in hepatic impairement - will hold both for now.  Hold atorvastatin as well, though lower suspicion that this is culprit.   Goals of care discussion  -with palliative team x2 last interaction on 3/7.  At that time despite being made aware of patient's poor status and poor prognosis her husband reported to the palliative provider that he was just not ready to let her go.  Her husband did wish for PEG placement now tentatively scheduled for 3/14 after washout of Plavix -Husband is agreeable to outpatient palliative provider follow-up in the event his wife further declines and would need referral to hospice care -Dtr in law states husband told by neurology that if pt were to improve and fully awaken it would take at least 2 months  Dysphagia/Nutrition Nutrition Status: Nutrition Problem: Inadequate oral intake Etiology: inability to eat Signs/Symptoms: NPO status Interventions: Tube feeding,Prostat Estimated  body mass index is 31.24 kg/m as calculated from the following:   Height as of this encounter: 5\' 3"  (1.6 m).   Weight as of this encounter: 80 kg. -Remains lethargic with most recent SLP evaluation on 3/7 recommended continued n.p.o. status-continue cortrack for tube feedings - Awaiting PEG 3/14  CKD 3A- AKI due to dehydration  Right Sided Hydronephrosis  -Baseline creatinine ~ 1.0-1.3 -Continue free water per tube and watching I & O closely to prevent heart failure exacerbation-on 3/10 creatinine had gone trending upward now 1.4.  Free water increased from 100 cc to 200 cc every 6 hours with creatinine on 3/11. -Earlier in the hospitalization due to steady rise of creatinine arenal ultrasound completed whichshowed right sided hydronephrosis(no mass or definite calcification identified).  Nephrology, urology consulted and she is status post right nephrostomy tube placement by IR on 2/21.  -3/10 Issues w/ mild urinary retention overnight requiring I/O cath-follow bladder scans -recurrent issues overnight on 3/12-13 - needs indwelling foley if recurrent  Diabetes mellitus 2 on current long-term insulin with hyperglycemia -Continue on insulin sliding scale every 4 hours while she is on tube feeds -basal insulin, bolus insulin - adjust prn -CBGs have been between 110 and 164 over the past 24 hours -HgbA1c = 7.6  Hypertension/chronic grade 2 diastolic heart failure -2D echoperformedduring this admission reveals grade 2  diastolic dysfunction -Remains euvolemic although overall net INO less than 10,000 cc since admission -Continue Norvasc  Physical deconditioning/residual left hemiplegia from prior stroke PT eval 3/11: Pt progressing slowly towards physical therapy goals. Pt did open eyes for the first time during therapy sessions this week. Pt following minimal commands, did hold RUE up in the air with cues, and holding herself steady EOB with min guard assist and no external support.  Husband present at end of session and questions answered. Will continue to follow and progress as able per POC.     Other problems: Group B strep UTI/recent fevers -GBS UTI from 2/12 appears to have been treated with ceftriaxone. 2/27 yeast UTI, completed fluconazole, WBC remains normal this morning  -Fever on 2/27, CXR with bibasilar opacities, urine culture showed yeast and completed fluconazole. Blood cultures1/2 with staph epidermidis (likely contaminated) -Low-grade temp 100.2 as of 3/5 overnight, afebrile since. Closely monitor  Intermittent agitation  -Has had issues with agitation when awake but for the most part remains lethargic -Continue liberalized visitation so family can sit with her  Gluteal skin breakdown -[place] and barrier cream -Continue air mattress to prevent further skin breakdown -turn as able Wound / Incision (Open or Dehisced) 03/27/20 Puncture Flank Right;Posterior nephrostomy tube placement insertion site (Active)  Date First Assessed/Time First Assessed: 03/27/20 1406   Wound Type: Puncture  Location: Flank  Location Orientation: Right;Posterior  Wound Description (Comments): nephrostomy tube placement insertion site  Present on Admission: No    Assessments 03/27/2020  8:09 AM 04/16/2020  8:00 AM  Dressing Type Gauze (Comment) Gauze (Comment)  Dressing Changed New --  Dressing Status Clean;Dry;Intact Clean;Dry;Intact  Dressing Change Frequency -- Daily  Site / Wound Assessment Clean;Dry Clean;Dry     No Linked orders to display    Hypothyroidism -Continue Synthroid per tube -TSH 6.651 on 2/12 and likely an acute phase reactant consistent with sick euthyroid syndrome -TSH with T3 and T4 will need to be repeated in May 2022  Hyperkalemia Resolved -Potassium normal   Data Reviewed: Basic Metabolic Panel: Recent Labs  Lab 04/12/20 0350 04/13/20 0358 04/14/20 0355 04/15/20 0258 04/16/20 0335  NA 139 141 138 136 136  K 4.6 4.3 4.3 4.5 5.0  CL  100 100 100 98 98  CO2 26 31 28 30 29   GLUCOSE 140* 136* 124* 112* 86  BUN 69* 64* 67* 66* 63*  CREATININE 1.36* 1.40* 1.34* 1.29* 1.26*  CALCIUM 10.2 10.3 10.1 9.9 9.8  MG  --   --   --   --  2.3  PHOS  --   --   --   --  4.3   Liver Function Tests: Recent Labs  Lab 04/10/20 0311 04/13/20 0358 04/15/20 0258 04/16/20 0335  AST 23 23 60* 164*  ALT 15 17 29  89*  ALKPHOS 95 92 143* 329*  BILITOT 0.7 0.6 0.2* 0.9  PROT 6.5 6.5 6.2* 6.4*  ALBUMIN 2.7* 2.6* 2.6* 2.7*   No results for input(s): LIPASE, AMYLASE in the last 168 hours. No results for input(s): AMMONIA in the last 168 hours. CBC: Recent Labs  Lab 04/10/20 0311 04/11/20 0407 04/12/20 0350 04/15/20 0258 04/16/20 0335  WBC 7.4 7.1 6.6 6.3 6.7  NEUTROABS  --   --   --  3.7 4.5  HGB 11.7* 11.5* 11.6* 11.4* 11.4*  HCT 35.6* 34.8* 35.0* 34.6* 33.7*  MCV 96.7 96.9 97.8 98.3 97.1  PLT 444* 408* 389 337 356   Cardiac Enzymes: No results for  input(s): CKTOTAL, CKMB, CKMBINDEX, TROPONINI in the last 168 hours. BNP (last 3 results) No results for input(s): BNP in the last 8760 hours.  ProBNP (last 3 results) No results for input(s): PROBNP in the last 8760 hours.  CBG: Recent Labs  Lab 04/15/20 2340 04/16/20 0419 04/16/20 0747 04/16/20 1128 04/16/20 1532  GLUCAP 126* 108* 126* 125* 121*    No results found for this or any previous visit (from the past 240 hour(s)).   Studies: No results found.  Scheduled Meds: . amLODipine  5 mg Per Tube Daily  . chlorhexidine  15 mL Mouth Rinse BID  . Chlorhexidine Gluconate Cloth  6 each Topical Daily  . feeding supplement (PROSource TF)  45 mL Per Tube TID  . free water  200 mL Per Tube Q6H  . heparin  5,000 Units Subcutaneous Q8H  . insulin aspart  0-9 Units Subcutaneous Q4H  . insulin aspart  2 Units Subcutaneous Q4H  . insulin detemir  5 Units Subcutaneous Daily  . levothyroxine  112 mcg Per Tube QAC breakfast  . liver oil-zinc oxide   Topical BID  . mouth  rinse  15 mL Mouth Rinse q12n4p  . modafinil  200 mg Per Tube Daily  . oxyCODONE  2.5 mg Per Tube Q6H  . sodium chloride flush  3 mL Intravenous Once  . sodium chloride flush  5 mL Intracatheter Q8H   Continuous Infusions: . sodium chloride 10 mL/hr at 03/24/20 0305  . feeding supplement (GLUCERNA 1.2 CAL) 1,000 mL (04/16/20 0525)    Principal Problem:   Acute encephalopathy Active Problems:   Diabetes mellitus (HCC)   Hypothyroidism   History of embolic stroke   Acute bilateral thalamic infarction (HCC)   Dysphagia   CKD (chronic kidney disease), symptom management only, stage 3 (moderate) (HCC)   Hydronephrosis, right   Group B streptococcal UTI   Uncontrolled type 2 diabetes mellitus with hyperglycemia, with long-term current use of insulin (HCC)   Chronic diastolic heart failure (HCC)   Essential hypertension   Physical debility   Left hemiplegia (HCC)   Palliative care by specialist   Goals of care, counseling/discussion   DNR (do not resuscitate)   Consultants: Neurology  Palliative care Nephrology Urology IR  Procedures: Right percutaneous nephrostomy 2/21  Antibiotics: Anti-infectives (From admission, onward)   Start     Dose/Rate Route Frequency Ordered Stop   04/07/20 2000  fluconazole (DIFLUCAN) 10 MG/ML suspension 100 mg        100 mg Per Tube Every evening 04/07/20 1236 04/08/20 2023   04/03/20 2000  fluconazole (DIFLUCAN) 40 MG/ML suspension 100 mg  Status:  Discontinued       "Followed by" Linked Group Details   100 mg Per Tube Every 24 hours 04/02/20 1752 04/07/20 1236   04/02/20 1900  fluconazole (DIFLUCAN) 40 MG/ML suspension 200 mg       "Followed by" Linked Group Details   200 mg Per Tube  Once 04/02/20 1752 04/02/20 2020   03/26/20 0730  cefTRIAXone (ROCEPHIN) 1 g in sodium chloride 0.9 % 100 mL IVPB        1 g 200 mL/hr over 30 Minutes Intravenous Every 24 hours 03/26/20 0644 03/30/20 0844   03/18/20 1900  azithromycin (ZITHROMAX) 500 mg  in sodium chloride 0.9 % 250 mL IVPB  Status:  Discontinued        500 mg 250 mL/hr over 60 Minutes Intravenous Every 24 hours 03/18/20 1830 03/19/20 1106   03/18/20 1900  cefTRIAXone (ROCEPHIN) 2 g in sodium chloride 0.9 % 100 mL IVPB  Status:  Discontinued        2 g 200 mL/hr over 30 Minutes Intravenous Every 24 hours 03/18/20 1830 03/19/20 1106       Time spent: 30 minutes    Lacretia Nicks ANP  [facility] 7 am - 330 pm/M-F for direct patient care and secure chat Please refer to Amion for contact info 29  days

## 2020-04-17 ENCOUNTER — Inpatient Hospital Stay (HOSPITAL_COMMUNITY): Payer: Medicare Other

## 2020-04-17 DIAGNOSIS — I10 Essential (primary) hypertension: Secondary | ICD-10-CM | POA: Diagnosis not present

## 2020-04-17 HISTORY — PX: IR GASTROSTOMY TUBE MOD SED: IMG625

## 2020-04-17 LAB — CBC WITH DIFFERENTIAL/PLATELET
Abs Immature Granulocytes: 0.05 10*3/uL (ref 0.00–0.07)
Basophils Absolute: 0.1 10*3/uL (ref 0.0–0.1)
Basophils Relative: 1 %
Eosinophils Absolute: 0.3 10*3/uL (ref 0.0–0.5)
Eosinophils Relative: 5 %
HCT: 35.1 % — ABNORMAL LOW (ref 36.0–46.0)
Hemoglobin: 11 g/dL — ABNORMAL LOW (ref 12.0–15.0)
Immature Granulocytes: 1 %
Lymphocytes Relative: 16 %
Lymphs Abs: 1 10*3/uL (ref 0.7–4.0)
MCH: 31.2 pg (ref 26.0–34.0)
MCHC: 31.3 g/dL (ref 30.0–36.0)
MCV: 99.4 fL (ref 80.0–100.0)
Monocytes Absolute: 0.7 10*3/uL (ref 0.1–1.0)
Monocytes Relative: 11 %
Neutro Abs: 4.1 10*3/uL (ref 1.7–7.7)
Neutrophils Relative %: 66 %
Platelets: 293 10*3/uL (ref 150–400)
RBC: 3.53 MIL/uL — ABNORMAL LOW (ref 3.87–5.11)
RDW: 13.7 % (ref 11.5–15.5)
WBC: 6.2 10*3/uL (ref 4.0–10.5)
nRBC: 0 % (ref 0.0–0.2)

## 2020-04-17 LAB — LACTIC ACID, PLASMA
Lactic Acid, Venous: 1.2 mmol/L (ref 0.5–1.9)
Lactic Acid, Venous: 0.9 mmol/L (ref 0.5–1.9)

## 2020-04-17 LAB — GLUCOSE, CAPILLARY
Glucose-Capillary: 109 mg/dL — ABNORMAL HIGH (ref 70–99)
Glucose-Capillary: 119 mg/dL — ABNORMAL HIGH (ref 70–99)
Glucose-Capillary: 142 mg/dL — ABNORMAL HIGH (ref 70–99)
Glucose-Capillary: 143 mg/dL — ABNORMAL HIGH (ref 70–99)
Glucose-Capillary: 173 mg/dL — ABNORMAL HIGH (ref 70–99)
Glucose-Capillary: 184 mg/dL — ABNORMAL HIGH (ref 70–99)

## 2020-04-17 LAB — URINALYSIS, ROUTINE W REFLEX MICROSCOPIC
Bilirubin Urine: NEGATIVE
Glucose, UA: NEGATIVE mg/dL
Hgb urine dipstick: NEGATIVE
Ketones, ur: NEGATIVE mg/dL
Nitrite: NEGATIVE
Protein, ur: NEGATIVE mg/dL
Specific Gravity, Urine: 1.017 (ref 1.005–1.030)
WBC, UA: >50 WBC/hpf — ABNORMAL HIGH (ref 0–5)
pH: 5 (ref 5.0–8.0)

## 2020-04-17 LAB — COMPREHENSIVE METABOLIC PANEL
ALT: 64 U/L — ABNORMAL HIGH (ref 0–44)
AST: 63 U/L — ABNORMAL HIGH (ref 15–41)
Albumin: 2.7 g/dL — ABNORMAL LOW (ref 3.5–5.0)
Alkaline Phosphatase: 268 U/L — ABNORMAL HIGH (ref 38–126)
Anion gap: 11 (ref 5–15)
BUN: 60 mg/dL — ABNORMAL HIGH (ref 8–23)
CO2: 26 mmol/L (ref 22–32)
Calcium: 9.8 mg/dL (ref 8.9–10.3)
Chloride: 98 mmol/L (ref 98–111)
Creatinine, Ser: 1.25 mg/dL — ABNORMAL HIGH (ref 0.44–1.00)
GFR, Estimated: 42 mL/min — ABNORMAL LOW (ref >60)
Glucose, Bld: 125 mg/dL — ABNORMAL HIGH (ref 70–99)
Potassium: 4.9 mmol/L (ref 3.5–5.1)
Sodium: 135 mmol/L (ref 135–145)
Total Bilirubin: 0.8 mg/dL (ref 0.3–1.2)
Total Protein: 6.7 g/dL (ref 6.5–8.1)

## 2020-04-17 LAB — PHOSPHORUS: Phosphorus: 4.5 mg/dL (ref 2.5–4.6)

## 2020-04-17 LAB — PROTIME-INR
INR: 1.1 (ref 0.8–1.2)
Prothrombin Time: 13.7 seconds (ref 11.4–15.2)

## 2020-04-17 LAB — MAGNESIUM: Magnesium: 2.3 mg/dL (ref 1.7–2.4)

## 2020-04-17 MED ORDER — ONDANSETRON HCL 4 MG/2ML IJ SOLN: 4.0000 mg | INTRAMUSCULAR | Status: DC | PRN

## 2020-04-17 MED ORDER — HEPARIN SODIUM (PORCINE) 5000 UNIT/ML IJ SOLN
5000.0000 [IU] | Freq: Three times a day (TID) | INTRAMUSCULAR | Status: DC
  Administered 2020-04-17 – 2020-05-01 (×42): 5000 [IU] via SUBCUTANEOUS
  Filled 2020-04-17 (×43): qty 1

## 2020-04-17 MED ORDER — GLUCAGON HCL RDNA (DIAGNOSTIC) 1 MG IJ SOLR
INTRAMUSCULAR | Status: AC
  Filled 2020-04-17: qty 1

## 2020-04-17 MED ORDER — FENTANYL CITRATE (PF) 100 MCG/2ML IJ SOLN
INTRAMUSCULAR | Status: AC
  Filled 2020-04-17: qty 2

## 2020-04-17 MED ORDER — IOHEXOL 300 MG/ML  SOLN
50.0000 mL | Freq: Once | INTRAMUSCULAR | Status: AC | PRN
  Administered 2020-04-17: 10 mL

## 2020-04-17 MED ORDER — CEFAZOLIN SODIUM-DEXTROSE 2-4 GM/100ML-% IV SOLN
INTRAVENOUS | Status: AC
  Administered 2020-04-17: 2 g
  Filled 2020-04-17: qty 100

## 2020-04-17 MED ORDER — GLUCAGON HCL (RDNA) 1 MG IJ SOLR
INTRAMUSCULAR | Status: AC | PRN
  Administered 2020-04-17: .5 mg via INTRAVENOUS

## 2020-04-17 MED ORDER — FENTANYL CITRATE (PF) 100 MCG/2ML IJ SOLN
INTRAMUSCULAR | Status: AC | PRN
  Administered 2020-04-17: 25 ug via INTRAVENOUS

## 2020-04-17 MED ORDER — LIDOCAINE HCL 1 % IJ SOLN
INTRAMUSCULAR | Status: AC
  Filled 2020-04-17: qty 20

## 2020-04-17 MED ORDER — CLOPIDOGREL BISULFATE 75 MG PO TABS
75.0000 mg | ORAL_TABLET | Freq: Every day | ORAL | Status: DC
  Administered 2020-04-18 – 2020-05-01 (×14): 75 mg
  Filled 2020-04-17 (×14): qty 1

## 2020-04-17 MED ORDER — MIDAZOLAM HCL 2 MG/2ML IJ SOLN
INTRAMUSCULAR | Status: AC | PRN
  Administered 2020-04-17: 0.5 mg via INTRAVENOUS

## 2020-04-17 MED ORDER — MIDAZOLAM HCL 2 MG/2ML IJ SOLN
INTRAMUSCULAR | Status: AC
  Filled 2020-04-17: qty 2

## 2020-04-17 MED ORDER — DEXTROSE IN LACTATED RINGERS 5 % IV SOLN: INTRAVENOUS | Status: AC

## 2020-04-17 NOTE — Progress Notes (Signed)
Pt noted to have temp 101.1, BP 124/66, HR 83, O2 Sat 96 Resp 22, no other s/s distress, gave 650 mg Tylenol via tube, notified Dr. Lowell Guitar, alerted Rapid Response Team, Charge Nurse, Educator present. Will continue to monitor and protocol.

## 2020-04-17 NOTE — Progress Notes (Signed)
Pt waa bladder scanned and amount of 546 ml noted, on call MD made aware and Foley was ordered as pt has had subsequent In and Out, Foley inserted with 2 RNs,  Foley drained 900 ml of urine with some odor, pt resting comfortable

## 2020-04-17 NOTE — Progress Notes (Signed)
Pharmacy: Post-IR Anticoagulant Review  87 YOF who underwent PEG on 3/14. The patient was on plavix for secondary CVA prophylaxis - which was held on 3/8 to washout for PEG. The patient was also on Hep SQ for VTE prophylaxis - last dose on 3/14 PM.   The procedure was labeled as a "standard risk" so Heparin SQ will be resumed >/=6h from the procedure - will start this PM. Plavix can resume on 3/15 AM.   Plan - Restart Heparin 5000 units SQ tid - starting at 2200 today - Restart Plavix 75 mg qd - starting on 3/15 AM - Pharmacy will sign off of the consult  Thank you for allowing pharmacy to be a part of this patient's care.  Georgina Pillion, PharmD, BCPS Clinical Pharmacist Clinical phone for 04/17/2020: [phone] 04/17/2020 12:53 PM   **Pharmacist phone directory can now be found on [email or web address] ([email or web address]).  Listed under [facility].

## 2020-04-17 NOTE — Plan of Care (Signed)
  Problem: Education: Goal: Knowledge of General Education information will improve Description: Including pain rating scale, medication(s)/side effects and non-pharmacologic comfort measures Outcome: Progressing   Problem: Health Behavior/Discharge Planning: Goal: Ability to manage health-related needs will improve Outcome: Progressing   Problem: Clinical Measurements: Goal: Ability to maintain clinical measurements within normal limits will improve Outcome: Progressing Goal: Will remain free from infection Outcome: Progressing Goal: Diagnostic test results will improve Outcome: Progressing Goal: Respiratory complications will improve Outcome: Progressing Goal: Cardiovascular complication will be avoided Outcome: Progressing   Problem: Activity: Goal: Risk for activity intolerance will decrease Outcome: Progressing   Problem: Nutrition: Goal: Adequate nutrition will be maintained Outcome: Progressing   Problem: Coping: Goal: Level of anxiety will decrease Outcome: Progressing   Problem: Elimination: Goal: Will not experience complications related to bowel motility Outcome: Progressing Goal: Will not experience complications related to urinary retention Outcome: Progressing   Problem: Pain Managment: Goal: General experience of comfort will improve Outcome: Progressing   Problem: Safety: Goal: Ability to remain free from injury will improve Outcome: Progressing   Problem: Skin Integrity: Goal: Risk for impaired skin integrity will decrease Outcome: Progressing   Problem: Safety: Goal: Non-violent Restraint(s) Outcome: Progressing   Problem: Education: Goal: Knowledge of disease or condition will improve Outcome: Progressing Goal: Knowledge of secondary prevention will improve Outcome: Progressing Goal: Knowledge of patient specific risk factors addressed and post discharge goals established will improve Outcome: Progressing Goal: Individualized  Educational Video(s) Outcome: Progressing   Problem: Coping: Goal: Will verbalize positive feelings about self Outcome: Progressing   Problem: Health Behavior/Discharge Planning: Goal: Ability to manage health-related needs will improve Outcome: Progressing   

## 2020-04-17 NOTE — TOC Progression Note (Signed)
Transition of Care [facility]) - Progression Note    Patient Details  Name: Hannah Fischer MRN: [id number] Date of Birth: 08/26/1932  [facility] CM/SW Contact  Janae Bridgeman, RN Phone Number: 04/17/2020, 8:17 AM  Clinical Narrative:    Case management received call back from [facility] - Patient has been approved for SNF placement at [facility] from 3/12 - 3/15.  Approval # [id number].  Please call Bjorn Pippin, CM with [facility] to update approval or send clinicals - fax additional clinicals to [phone].   Expected Discharge Plan: Skilled Nursing Facility Barriers to Discharge: Continued Medical Work up (Patient scheduled for PEG placement on 04/17/2020)  Expected Discharge Plan and Services Expected Discharge Plan: Skilled Nursing Facility In-house Referral: Clinical Social Work,Hospice / Palliative Care Discharge Planning Services: CM Consult Post Acute Care Choice: Skilled Nursing Facility Living arrangements for the past 2 months: Skilled Nursing Facility                                       Social Determinants of Health (SDOH) Interventions    Readmission Risk Interventions Readmission Risk Prevention Plan 04/13/2020  Transportation Screening Complete  Medication Review [employer]) Complete  PCP or Specialist appointment within 3-5 days of discharge Complete  HRI or Home Care Consult Complete  SW Recovery Care/Counseling Consult Complete  Palliative Care Screening Complete  Skilled Nursing Facility Complete  Some recent data might be hidden

## 2020-04-17 NOTE — Progress Notes (Signed)
Physical Therapy Treatment Patient Details Name: Hannah Fischer MRN: [id number] DOB: [birth year] Today's Date: 04/17/2020    History of Present Illness Pt is an 87 y/o female admitted 03/18/20 with CVA. Imaging (+) for bilateral medial thalamic and punctate infarcts and L superior cerebellar infarcts. PEG placed 3/14. PMH significant for multiple CVAs with most recent ~3 months ago, residual L-sided hemiplegia with recent d/c from outpatient PT/OT with return to baseline level of function, DM, PE, HTN.    PT Comments    Pt progressing towards physical therapy goals. Pt with eyes open throughout most of session today. She was able to show muscle initiation move LUE, pull trunk anteriorly, and to initiate push up to sitting from a propped position to L (assist required to achieve midline). Will continue to follow and progress as able per POC.    Follow Up Recommendations  SNF;Supervision/Assistance - 24 hour     Equipment Recommendations  Wheelchair cushion (measurements PT);Wheelchair (measurements PT);Hospital bed    Recommendations for Other Services       Precautions / Restrictions Precautions Precautions: Fall Precaution Comments: nephrostomy drain, at risk for skin breakdown, PEG Restrictions Weight Bearing Restrictions: No    Mobility  Bed Mobility Overal bed mobility: Needs Assistance Bed Mobility: Rolling Rolling: Total assist;+2 for physical assistance Sidelying to sit: Total assist;+2 for physical assistance;HOB elevated Supine to sit: Total assist;+2 for physical assistance;+2 for safety/equipment Sit to supine: Total assist;+2 for physical assistance;+2 for safety/equipment   General bed mobility comments: +2 assist required for all aspects of bed mobility.    Transfers                    Ambulation/Gait                 Stairs             Wheelchair Mobility    Modified Rankin (Stroke Patients Only) Modified Rankin (Stroke Patients  Only) Pre-Morbid Rankin Score: No significant disability Modified Rankin: Severe disability     Balance Overall balance assessment: Needs assistance Sitting-balance support: Feet unsupported;No upper extremity supported Sitting balance-Leahy Scale: Poor Sitting balance - Comments: Anterior lean - at times pt pulling herself forward. R lateral lean improved after L lateral lean onto L elbow for ~30 seconds. Postural control: Right lateral lean                                  Cognition Arousal/Alertness: Lethargic Behavior During Therapy: Flat affect Overall Cognitive Status: Impaired/Different from baseline Area of Impairment: Attention;Following commands;Awareness;Problem solving                   Current Attention Level: Focused Memory: Decreased short-term memory;Decreased recall of precautions Following Commands: Follows one step commands inconsistently;Follows one step commands with increased time Safety/Judgement: Decreased awareness of safety;Decreased awareness of deficits Awareness: Intellectual Problem Solving: Slow processing;Decreased initiation;Requires verbal cues;Requires tactile cues;Difficulty sequencing General Comments: Pt more alert this session, maintaining eyes open for most of the session. Pt attempting to follow at least 1 command ("sit up", while leaning onto her L side).      Exercises Other Exercises Other Exercises: Cervical rotation to the L 3x30" Other Exercises: pec stretch with cervical extension to neutral    General Comments        Pertinent Vitals/Pain Pain Assessment: Faces Faces Pain Scale: Hurts little more Pain Location: PROM of neck, B UE's  Pain Descriptors / Indicators: Grimacing Pain Intervention(s): Monitored during session;Repositioned;Limited activity within patient's tolerance    Home Living                      Prior Function            PT Goals (current goals can now be found in the care  plan section) Acute Rehab PT Goals Patient Stated Goal: Unable to state. PT Goal Formulation: Patient unable to participate in goal setting Time For Goal Achievement: 05/01/20 Potential to Achieve Goals: Fair Progress towards PT goals: Progressing toward goals    Frequency    Min 3X/week      PT Plan Current plan remains appropriate    Co-evaluation PT/OT/SLP Co-Evaluation/Treatment: Yes Reason for Co-Treatment: Complexity of the patient's impairments (multi-system involvement);Necessary to address cognition/behavior during functional activity;For patient/therapist safety;To address functional/ADL transfers PT goals addressed during session: Mobility/safety with mobility;Balance;Strengthening/ROM        AM-PAC PT "6 Clicks" Mobility   Outcome Measure  Help needed turning from your back to your side while in a flat bed without using bedrails?: Total Help needed moving from lying on your back to sitting on the side of a flat bed without using bedrails?: Total Help needed moving to and from a bed to a chair (including a wheelchair)?: Total Help needed standing up from a chair using your arms (e.g., wheelchair or bedside chair)?: Total Help needed to walk in hospital room?: Total Help needed climbing 3-5 steps with a railing? : Total 6 Click Score: 6    End of Session Equipment Utilized During Treatment: Oxygen Activity Tolerance: Patient limited by fatigue (weakness) Patient left: in bed;with call bell/phone within reach;with nursing/sitter in room;Other (comment) (OT still present) Nurse Communication: Mobility status;Need for lift equipment PT Visit Diagnosis: Other symptoms and signs involving the nervous system (R29.898);Difficulty in walking, not elsewhere classified (R26.2);Unsteadiness on feet (R26.81);Muscle weakness (generalized) (M62.81)     Time: 0539-7673 PT Time Calculation (min) (ACUTE ONLY): 25 min  Charges:  $Neuromuscular Re-education: 8-22 mins                      Conni Slipper, PT, DPT Acute Rehabilitation Services Pager: [phone] Office: [phone]    Marylynn Pearson 04/17/2020, 10:26 AM

## 2020-04-17 NOTE — Progress Notes (Signed)
[facility] PROGRESS NOTE  LUCIENNE SAWYERS XFG:[id number] DOB: 28-Sep-1932 DOA: 03/18/2020 PCP: Sigmund Hazel, MD  Status: Remains inpatient appropriate because:Altered mental status, Unsafe d/c plan and Inpatient level of care appropriate due to severity of illness   Dispo: The patient is from: Home              Anticipated d/c is to: SNF              Patient currently is not medically stable to d/c.               Barriers to discharge: Patient very slow to progress with PT and OT and unclear if she is an appropriate rehab candidate for SNF or will need long-term placement.  Had PEG tube placed on 3/8.  Family has confirmed that they do not have the resources to pay for skilled nursing facility services out-of-pocket once insurance benefits exhausted.   Difficult to place patient Yes   Level of care: Progressive  Code Status: DNR Family Communication: 3/14 husband at bedside.  Updated on patient's current clinical status including plans to remain inpatient until medical therapy achieved in regards to hypertonicity and spasticity treatment.  Also discussed with husband discharge disposition.  He states that once current insurance has been depleted in regards to short-term stay at facility that he and the family are prepared to pay $3500 per month out-of-pocket since that will be their co-pay.  Did reinforce to patient husband that money paid out-of-pocket would not be reimbursable by Medicaid when and if she becomes eligible and/or approved.  Also discussed with husband that we will continue to monitor the patient to complete the 41-month window as recommended by the neurologist but if no improvement seen will need to revisit goals of care. 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 Shehad gone to bedin the morning her husbandhad trouble waking her up. In the ED she was noted to be responsive to pain. A code stroke was called. She was outside the window for TPA. An emergent CT did not show any acute abnormality. A CTA was performed and did not reveal any obvious stenosis. An MRI of the brain was performed on 2/13:Punctate acute infarction in the left superior cerebellumadjacent to the superior cerebellar peduncle. Bilateral thalamicacute infarction extending back into the mid brain. This is consistent with artery of Percheron infarction.She continues to wax and wane from a mental status standpoint. She has cortrak. Palliative and neuro are following, family eventually decided for PEG which is now pending Plavix washout  Subjective: Patient examined after PEG tube placement.  Very lethargic but did mumble to some questions asked and with manipulation of her feet continue to verbally complain of pain by staying now.  Husband at bedside and has been updated.  Objective: Vitals:   04/17/20 0340 04/17/20 0737  BP: 125/60 136/68  Pulse: 75 81  Resp: 20 20  Temp: 98.1 F (36.7 C) 99.1 F (37.3 C)  SpO2: 97% 96%    Intake/Output Summary (Last 24 hours) at 04/17/2020 0754 Last data filed at 04/17/2020 0537 Gross per 24 hour  Intake 40 ml  Output 2350 ml  Net -2310 ml   Filed Weights   04/04/20 0438 04/05/20 0500 04/08/20 0423  Weight: 81.8 kg 79.5 kg 80 kg  Exam:  Constitutional: Lethargic, no acute distress Respiratory: Bilateral lung sounds are clear to auscultation anteriorly and she is stable on room air Cardiovascular: Heart sounds are normal, pulse is regular nontachycardic, no peripheral edema and skin is warm and dry Abdomen:  Cortrak tube has been removed.  PEG tube now in place and has been approved for use.  Abdomen unremarkable.  LBM 3/11 Genitourinary: Right nephrostomy tube with yellow urine noted in  collection bag-Foley placed 3/14 Neurologic: Cranial nerves II through XII intact.  Clarification: Patient appears to affect cataract surgery on left eye, right pupil 2 mm and reactive.  Noted with spontaneous movement of right upper extremity today.  When alert and stimulated does have left upper extremity spasticity.  Continues to have painful hypertonicity in bilateral lower extremities Psychiatric: Lethargic.  Briefly awakened without opening eyes and mumble but unable to accurately assess orientation given lack of interaction.   Assessment/Plan: Acute problems: Bilateral Thalamic Infarcts Persistent acute metabolic encephalopathy  -due to CVA, mental status waxing and waning, at times she is minimally responsive versus talking some.  Neurology following -Initially started on Provigil with gradual up titration but unfortunately developed tremulousness and dose was discontinued.   -She had also been given a trial of amantadine but this was discontinued.   -She has a prior history of embolic infarcts with imaging on admission showed punctate acute infarction in the left superior cerebellum adjacent to the superior cerebellar peduncle.  Bilateral thalamic acute infarction extending back into the midbrain, consistent with artery of Percheron infarction.    -She had an event with asymmetric pupils on 2/25, had a repeat MRI without any new acute findings and expected evolution of known infarcts.   EEG 2/13 with moderate diffuse encephalopathy no apparent underlying seizure activity -We will resume Plavix on 3/15  -Statin placed on hold over the weekend in context of transaminitis which is improving  Hypertonicity and spasticity secondary to recent brain injury from stroke -Patient having significant hypertonicity of lower extremities which preclude OT and PT working effectively with her -She is also having spasticity related tremors in the upper extremities also impeding ability to participate with  therapy -In addition with severe hypertonicity appears to be causing the patient pain -Skelaxin and Robaxin placed on hold given transaminitis.  LFTs improving.  Robaxin does not cause undesired side effect of transaminitis so could conceivably resume this medication. -3/10 began low-dose OxyIR 2.5 mg scheduled every 6 hours -Consulted Dr. Riley Kill via secure chat to assist with initiating/titrating medications for spasticity and hypertonicity  Recurrent urinary retention -Required repeat I/O cath-foley inserted 3/14 w/ 900 cc returned. -Possibly recurred 2/2 meds which have been dc'd  Goals of care discussion  -with palliative team x2 last interaction on 3/7.  At that time despite being made aware of patient's poor status and poor prognosis her husband reported to the palliative provider that he was just not ready to let her go.  Her husband did wish for PEG placement now tentatively scheduled for 3/14 after washout of Plavix -Husband is agreeable to outpatient palliative provider follow-up in the event his wife further declines and would need referral to hospice care -Dtr in law states husband told by neurology that if pt were to improve and fully awaken it would take at least 2 months  Dysphagia/Nutrition Nutrition Status: Nutrition Problem: Inadequate oral intake Etiology: inability to eat Signs/Symptoms: NPO status Interventions: Tube feeding,Prostat Estimated body mass index is 31.24 kg/m as calculated from the following:   Height  as of this encounter: 5\' 3"  (1.6 m).   Weight as of this encounter: 80 kg. -Remains lethargic with most recent SLP evaluation on 3/7 recommended continued n.p.o. status-continue cortrack for tube feedings - Awaiting PEG 3/14  CKD 3A- AKI due to dehydration  Right Sided Hydronephrosis  -Baseline creatinine ~ 1.0-1.3 -Creatinine has stabilized with increasing free water from 100 cc to 200 cc every 6 hours -Earlier in the hospitalization due to steady rise  of creatinine arenal ultrasound completed whichshowed right sided hydronephrosis(no mass or definite calcification identified).  Nephrology, urology consulted and she is status post right nephrostomy tube placement by IR on 2/21.  -Has continued to have issues with urinary retention therefore Foley catheter placed overnight into 3/14 with significant amount of urine returned i.e. greater than 900 cc  Diabetes mellitus 2 on current long-term insulin with hyperglycemia -Continue on insulin sliding scale every 4 hours while she is on tube feeds -basal insulin, bolus insulin - adjust prn -CBGs have been between 110 and 164 over the past 24 hours -HgbA1c = 7.6  Hypertension/chronic grade 2 diastolic heart failure -2D echoperformedduring this admission reveals grade 2 diastolic dysfunction -Remains euvolemic although overall net INO less than 10,000 cc since admission -Continue Norvasc  Physical deconditioning/residual left hemiplegia from prior stroke PT eval 3/7:    Pt progressing very slowly towards physical therapy goals. Pt sat EOB ~18 minutes with +2 assist. Therapist in front facilitating functional activity with tech posteriorly providing truncal support. Therapist facilitated pt wiping eyes and mouth with a washcloth. Once washcloth against skin, pt initiating wiping motion. Pt also maintaining a propped position laterally on both sides with assist to get into position. Son and husband present throughout session and attempting to communicate with pt, however with no eye opening or obvious response. Pt appeared to attempt a smile occasionally to son's voice but was very faint. Of note, pt holding B hands in a fist within mitts. It appeared that fingernails were digging into palms. Washcloths were rolled and placed into hands within mitts to avoid a pressure injury. Occupational therapist notified to assess next session. Will continue to follow and progress as able per POC.      Other  problems: Group B strep UTI/recent fevers -GBS UTI from 2/12 appears to have been treated with ceftriaxone. 2/27 yeast UTI, completed fluconazole, WBC remains normal this morning  -Fever on 2/27, CXR with bibasilar opacities, urine culture showed yeast and completed fluconazole. Blood cultures1/2 with staph epidermidis (likely contaminated) -Low-grade temp 100.2 as of 3/5 overnight, afebrile since. Closely monitor  Intermittent agitation  -Has had issues with agitation when awake but for the most part remains lethargic -Continue liberalized visitation so family can sit with her  Gluteal skin breakdown -[place] and barrier cream -Continue air mattress to prevent further skin breakdown -turn as able Wound / Incision (Open or Dehisced) 03/27/20 Puncture Flank Right;Posterior nephrostomy tube placement insertion site (Active)  Date First Assessed/Time First Assessed: 03/27/20 1406   Wound Type: Puncture  Location: Flank  Location Orientation: Right;Posterior  Wound Description (Comments): nephrostomy tube placement insertion site  Present on Admission: No    Assessments 03/27/2020  8:09 AM 04/16/2020  8:00 PM  Dressing Type Gauze (Comment) Gauze (Comment)  Dressing Changed New Changed  Dressing Status Clean;Dry;Intact Dry;Clean  Dressing Change Frequency -- Daily  Site / Wound Assessment Clean;Dry Dry;Clean     No Linked orders to display    Hypothyroidism -Continue Synthroid per tube -TSH 6.651 on 2/12 and  likely an acute phase reactant consistent with sick euthyroid syndrome -TSH with T3 and T4 will need to be repeated in May 2022  Hyperkalemia Resolved -Potassium normal   Data Reviewed: Basic Metabolic Panel: Recent Labs  Lab 04/13/20 0358 04/14/20 0355 04/15/20 0258 04/16/20 0335 04/17/20 0202  NA 141 138 136 136 135  K 4.3 4.3 4.5 5.0 4.9  CL 100 100 98 98 98  CO2 31 28 30 29 26   GLUCOSE 136* 124* 112* 86 125*  BUN 64* 67* 66* 63* 60*  CREATININE 1.40* 1.34*  1.29* 1.26* 1.25*  CALCIUM 10.3 10.1 9.9 9.8 9.8  MG  --   --   --  2.3 2.3  PHOS  --   --   --  4.3 4.5   Liver Function Tests: Recent Labs  Lab 04/13/20 0358 04/15/20 0258 04/16/20 0335 04/17/20 0202  AST 23 60* 164* 63*  ALT 17 29 89* 64*  ALKPHOS 92 143* 329* 268*  BILITOT 0.6 0.2* 0.9 0.8  PROT 6.5 6.2* 6.4* 6.7  ALBUMIN 2.6* 2.6* 2.7* 2.7*   No results for input(s): LIPASE, AMYLASE in the last 168 hours. No results for input(s): AMMONIA in the last 168 hours. CBC: Recent Labs  Lab 04/11/20 0407 04/12/20 0350 04/15/20 0258 04/16/20 0335 04/17/20 0202  WBC 7.1 6.6 6.3 6.7 6.2  NEUTROABS  --   --  3.7 4.5 4.1  HGB 11.5* 11.6* 11.4* 11.4* 11.0*  HCT 34.8* 35.0* 34.6* 33.7* 35.1*  MCV 96.9 97.8 98.3 97.1 99.4  PLT 408* 389 337 356 293   Cardiac Enzymes: No results for input(s): CKTOTAL, CKMB, CKMBINDEX, TROPONINI in the last 168 hours. BNP (last 3 results) No results for input(s): BNP in the last 8760 hours.  ProBNP (last 3 results) No results for input(s): PROBNP in the last 8760 hours.  CBG: Recent Labs  Lab 04/16/20 1532 04/16/20 1956 04/16/20 2343 04/17/20 0359 04/17/20 0740  GLUCAP 121* 137* 124* 109* 143*    Recent Results (from the past 240 hour(s))  Surgical pcr screen     Status: Abnormal   Collection Time: 04/16/20  7:28 PM   Specimen: Nasal Mucosa; Nasal Swab  Result Value Ref Range Status   MRSA, PCR NEGATIVE NEGATIVE Final   Staphylococcus aureus POSITIVE (A) NEGATIVE Final    Comment: (NOTE) The Xpert SA Assay (FDA approved for NASAL specimens in patients 51 years of age and older), is one component of a comprehensive surveillance program. It is not intended to diagnose infection nor to guide or monitor treatment. Performed at [facility], [address], [place], [place]      Studies: No results found.  Scheduled Meds: . amLODipine  5 mg Per Tube Daily  . chlorhexidine  15 mL Mouth Rinse BID  .  Chlorhexidine Gluconate Cloth  6 each Topical Daily  . feeding supplement (PROSource TF)  45 mL Per Tube TID  . free water  200 mL Per Tube Q6H  . insulin aspart  0-9 Units Subcutaneous Q4H  . insulin aspart  2 Units Subcutaneous Q4H  . insulin detemir  5 Units Subcutaneous Daily  . levothyroxine  112 mcg Per Tube QAC breakfast  . lidocaine      . liver oil-zinc oxide   Topical BID  . mouth rinse  15 mL Mouth Rinse q12n4p  . modafinil  200 mg Per Tube Daily  . oxyCODONE  2.5 mg Per Tube Q6H  . sodium chloride flush  3  mL Intravenous Once  . sodium chloride flush  5 mL Intracatheter Q8H   Continuous Infusions: . sodium chloride 10 mL/hr at 03/24/20 0305  . feeding supplement (GLUCERNA 1.2 CAL) Stopped (04/17/20 0011)    Principal Problem:   Acute encephalopathy Active Problems:   Diabetes mellitus (HCC)   Hypothyroidism   History of embolic stroke   Acute bilateral thalamic infarction (HCC)   Dysphagia   CKD (chronic kidney disease), symptom management only, stage 3 (moderate) (HCC)   Hydronephrosis, right   Group B streptococcal UTI   Uncontrolled type 2 diabetes mellitus with hyperglycemia, with long-term current use of insulin (HCC)   Chronic diastolic heart failure (HCC)   Essential hypertension   Physical debility   Left hemiplegia (HCC)   Palliative care by specialist   Goals of care, counseling/discussion   DNR (do not resuscitate)   Consultants: Neurology  Palliative care Nephrology Urology IR  Procedures: Right percutaneous nephrostomy 2/21  Antibiotics: Anti-infectives (From admission, onward)   Start     Dose/Rate Route Frequency Ordered Stop   04/07/20 2000  fluconazole (DIFLUCAN) 10 MG/ML suspension 100 mg        100 mg Per Tube Every evening 04/07/20 1236 04/08/20 2023   04/03/20 2000  fluconazole (DIFLUCAN) 40 MG/ML suspension 100 mg  Status:  Discontinued       "Followed by" Linked Group Details   100 mg Per Tube Every 24 hours 04/02/20 1752  04/07/20 1236   04/02/20 1900  fluconazole (DIFLUCAN) 40 MG/ML suspension 200 mg       "Followed by" Linked Group Details   200 mg Per Tube  Once 04/02/20 1752 04/02/20 2020   03/26/20 0730  cefTRIAXone (ROCEPHIN) 1 g in sodium chloride 0.9 % 100 mL IVPB        1 g 200 mL/hr over 30 Minutes Intravenous Every 24 hours 03/26/20 0644 03/30/20 0844   03/18/20 1900  azithromycin (ZITHROMAX) 500 mg in sodium chloride 0.9 % 250 mL IVPB  Status:  Discontinued        500 mg 250 mL/hr over 60 Minutes Intravenous Every 24 hours 03/18/20 1830 03/19/20 1106   03/18/20 1900  cefTRIAXone (ROCEPHIN) 2 g in sodium chloride 0.9 % 100 mL IVPB  Status:  Discontinued        2 g 200 mL/hr over 30 Minutes Intravenous Every 24 hours 03/18/20 1830 03/19/20 1106       Time spent: 30 minutes    Junious Silk ANP  [facility] 7 am - 330 pm/M-F for direct patient care and secure chat Please refer to Amion for contact info 30  days

## 2020-04-17 NOTE — Progress Notes (Signed)
OT Cancellation Note  Patient Details Name: Hannah Fischer MRN: [id number] DOB: 02/04/1933   Cancelled Treatment:    Reason Eval/Treat Not Completed: Patient at procedure or test/ unavailable (Off the floor at rad.) Will return as schedule allows.   [name] [name] [name] MSOT, OTR/L Acute Rehab Pager: [phone] Office: [phone] 04/17/2020, 8:06 AM

## 2020-04-17 NOTE — Procedures (Signed)
  Procedure: Perc gastrostomy catheter placement 37f EBL:   minimal Complications:  none immediate  See full dictation in [employer].  Thora Lance MD Main # [phone] Pager  [phone] Mobile [phone]

## 2020-04-17 NOTE — Progress Notes (Signed)
Occupational Therapy Treatment Patient Details Name: Hannah Fischer MRN: [id number] DOB: 1932/12/06 Today's Date: 04/17/2020    History of present illness Pt is an 87 y/o female admitted 03/18/20 with CVA. Imaging (+) for bilateral medial thalamic and punctate infarcts and L superior cerebellar infarcts. PEG placed 3/14. PMH significant for multiple CVAs with most recent ~3 months ago, residual L-sided hemiplegia with recent d/c from outpatient PT/OT with return to baseline level of function, DM, PE, HTN.   OT comments  Pt progressing slowly towards established OT goals and presenting with increased arousal this session; maintaining her eyes open when upright at EOB. Pt requiring Total A for bed mobility and Max-Total A for sitting at EOB. Pt making eye contact with therapist; noting dysconjugate gaze and R gaze/head turn. Pt requiring total hand over hand for grooming. Donned resting hand splint and check skin integrity; notified RN for wear schedule. Continue to recommend dc to SNF and will continue to follow acutely as admitted.    Follow Up Recommendations  SNF    Equipment Recommendations  Hospital bed    Recommendations for Other Services      Precautions / Restrictions Precautions Precautions: Fall Precaution Comments: nephrostomy drain, at risk for skin breakdown, PEG Restrictions Weight Bearing Restrictions: No       Mobility Bed Mobility Overal bed mobility: Needs Assistance Bed Mobility: Supine to Sit;Sit to Supine Rolling: Total assist;+2 for physical assistance Sidelying to sit: Total assist;+2 for physical assistance;HOB elevated Supine to sit: Total assist;+2 for physical assistance;+2 for safety/equipment Sit to supine: Total assist;+2 for physical assistance;+2 for safety/equipment   General bed mobility comments: Total A +2 for bed mobility due to decreased engagement    Transfers                 General transfer comment: Defer due to fatigue     Balance Overall balance assessment: Needs assistance Sitting-balance support: Feet unsupported;No upper extremity supported Sitting balance-Leahy Scale: Poor Sitting balance - Comments: Anterior lean - at times pt pulling herself forward. R lateral lean improved after L lateral lean onto L elbow for ~30 seconds. Postural control: Right lateral lean                                 ADL either performed or assessed with clinical judgement   ADL Overall ADL's : Needs assistance/impaired     Grooming: Brushing hair;Wash/dry face;Sitting;Bed level;Total assistance Grooming Details (indicate cue type and reason): Total hand over hand for engaging in washing her face at bed level and then brushing her hair at EOB. Pt making eye contact with therapist but not initating task                               General ADL Comments: Focused session on sitting balance and engagement in ADLs     Vision   Additional Comments: Noting dysconjugate gaze while sitting at EOB. R eye focusing on therapist while L eye "floating" L.   Perception     Praxis      Cognition Arousal/Alertness: Lethargic Behavior During Therapy: Flat affect Overall Cognitive Status: Impaired/Different from baseline Area of Impairment: Attention;Following commands;Awareness;Problem solving                   Current Attention Level: Focused Memory: Decreased short-term memory;Decreased recall of precautions Following Commands: Follows one step  commands inconsistently;Follows one step commands with increased time Safety/Judgement: Decreased awareness of safety;Decreased awareness of deficits Awareness: Intellectual Problem Solving: Slow processing;Decreased initiation;Requires verbal cues;Requires tactile cues;Difficulty sequencing General Comments: Pt more alert this session, maintaining eyes open for most of the session (when sitting up at EOB). Pt attempting initiation to follow at least 1  command ("sit up", while leaning onto her L side). Able to make eye contact.        Exercises Exercises: Other exercises Other Exercises Other Exercises: Cervical rotation to the L 3x30" Other Exercises: Left lateral lean onto L elbow. x2 Other Exercises: pec stretch with cervical extension to neutral Other Exercises: Washing her hands/palm. Donning splint and notified.   Shoulder Instructions       General Comments HR 90s    Pertinent Vitals/ Pain       Pain Assessment: Faces Faces Pain Scale: Hurts little more Pain Location: PROM of neck, B UE's Pain Descriptors / Indicators: Grimacing Pain Intervention(s): Monitored during session;Limited activity within patient's tolerance;Repositioned  Home Living                                          Prior Functioning/Environment              Frequency  Min 1X/week        Progress Toward Goals  OT Goals(current goals can now be found in the care plan section)  Progress towards OT goals: Progressing toward goals  Acute Rehab OT Goals Patient Stated Goal: Unable to state. OT Goal Formulation: With family Time For Goal Achievement: 05/01/20 Potential to Achieve Goals: Fair ADL Goals Additional ADL Goal #1: Patient will keep eyes open for 50% of OT treatment session demonstrating increased alertness and level of arousal. Additional ADL Goal #2: Patient will maintain static sitting balance at EOB with no more than Min A in prep for seated ADLs. Additional ADL Goal #3: Patient will attend to 1 grooming task >1-3 min with Mod cues and hand over hand assist. Additional ADL Goal #4: RN/family will don/doff R resting hand splint and R/L palm guards correctly and recall established wearing schedule with I.  Plan Discharge plan remains appropriate;Frequency remains appropriate    Co-evaluation    PT/OT/SLP Co-Evaluation/Treatment: Yes Reason for Co-Treatment: For patient/therapist safety;To address  functional/ADL transfers PT goals addressed during session: Mobility/safety with mobility;Balance;Strengthening/ROM OT goals addressed during session: ADL's and self-care      AM-PAC OT "6 Clicks" Daily Activity     Outcome Measure   Help from another person eating meals?: Total Help from another person taking care of personal grooming?: Total Help from another person toileting, which includes using toliet, bedpan, or urinal?: Total Help from another person bathing (including washing, rinsing, drying)?: Total Help from another person to put on and taking off regular upper body clothing?: Total Help from another person to put on and taking off regular lower body clothing?: Total 6 Click Score: 6    End of Session Equipment Utilized During Treatment: Gait belt  OT Visit Diagnosis: Unsteadiness on feet (R26.81);Other abnormalities of gait and mobility (R26.89);Muscle weakness (generalized) (M62.81);Hemiplegia and hemiparesis;Other symptoms and signs involving cognitive function;Pain Hemiplegia - Right/Left: Right Hemiplegia - dominant/non-dominant: Dominant Hemiplegia - caused by: Cerebral infarction   Activity Tolerance Patient limited by fatigue   Patient Left in bed;with call bell/phone within reach;with bed alarm set   Nurse Communication Mobility  status        Time: 6160-7371 OT Time Calculation (min): 31 min  Charges: OT General Charges $OT Visit: 1 Visit OT Treatments $Self Care/Home Management : 8-22 mins  Dionis Autry MSOT, OTR/L Acute Rehab Pager: [phone] Office: [phone]   Theodoro Grist Kymere Fullington 04/17/2020, 1:12 PM

## 2020-04-18 ENCOUNTER — Inpatient Hospital Stay (HOSPITAL_COMMUNITY): Payer: Medicare Other

## 2020-04-18 DIAGNOSIS — I10 Essential (primary) hypertension: Secondary | ICD-10-CM | POA: Diagnosis not present

## 2020-04-18 LAB — GLUCOSE, CAPILLARY
Glucose-Capillary: 158 mg/dL — ABNORMAL HIGH (ref 70–99)
Glucose-Capillary: 160 mg/dL — ABNORMAL HIGH (ref 70–99)
Glucose-Capillary: 169 mg/dL — ABNORMAL HIGH (ref 70–99)
Glucose-Capillary: 140 mg/dL — ABNORMAL HIGH (ref 70–99)
Glucose-Capillary: 105 mg/dL — ABNORMAL HIGH (ref 70–99)
Glucose-Capillary: 121 mg/dL — ABNORMAL HIGH (ref 70–99)

## 2020-04-18 LAB — COMPREHENSIVE METABOLIC PANEL
ALT: 67 U/L — ABNORMAL HIGH (ref 0–44)
AST: 144 U/L — ABNORMAL HIGH (ref 15–41)
Albumin: 2.4 g/dL — ABNORMAL LOW (ref 3.5–5.0)
Alkaline Phosphatase: 326 U/L — ABNORMAL HIGH (ref 38–126)
Anion gap: 8 (ref 5–15)
BUN: 52 mg/dL — ABNORMAL HIGH (ref 8–23)
CO2: 29 mmol/L (ref 22–32)
Calcium: 9.8 mg/dL (ref 8.9–10.3)
Chloride: 99 mmol/L (ref 98–111)
Creatinine, Ser: 1.26 mg/dL — ABNORMAL HIGH (ref 0.44–1.00)
GFR, Estimated: 42 mL/min — ABNORMAL LOW (ref >60)
Glucose, Bld: 169 mg/dL — ABNORMAL HIGH (ref 70–99)
Potassium: 4.8 mmol/L (ref 3.5–5.1)
Sodium: 136 mmol/L (ref 135–145)
Total Bilirubin: 2.1 mg/dL — ABNORMAL HIGH (ref 0.3–1.2)
Total Protein: 6 g/dL — ABNORMAL LOW (ref 6.5–8.1)

## 2020-04-18 NOTE — Progress Notes (Signed)
Per phone call with Toniann Fail, [place] for IR, IR assessed pt PEG tube this morning, cleared to give meds and tube feeds.

## 2020-04-18 NOTE — Progress Notes (Signed)
[facility] PROGRESS NOTE  Hannah Fischer:[id number] DOB: [birth year] DOA: 03/18/2020 PCP: Sigmund Hazel, MD  Status: Remains inpatient appropriate because:Altered mental status, Unsafe d/c plan and Inpatient level of care appropriate due to severity of illness   Dispo: The patient is from: Home              Anticipated d/c is to: SNF              Patient currently is not medically stable to d/c.               Barriers to discharge: Patient very slow to progress with PT and OT and unclear if she is an appropriate rehab candidate for SNF or will need long-term placement.  Had PEG tube placed on 3/8.  Family has confirmed that they do not have the resources to pay for skilled nursing facility services out-of-pocket once insurance benefits exhausted.   Difficult to place patient Yes   Level of care: Progressive  Code Status: DNR Family Communication: 3/14 husband at bedside.  Updated on patient's current clinical status including plans to remain inpatient until medical therapy achieved in regards to hypertonicity and spasticity treatment.  Also discussed with husband discharge disposition.  He states that once current insurance has been depleted in regards to short-term stay at facility that he and the family are prepared to pay $3500 per month out-of-pocket since that will be their co-pay.  Did reinforce to patient husband that money paid out-of-pocket would not be reimbursable by Medicaid when and if she becomes eligible and/or approved.  Also discussed with husband that we will continue to monitor the patient to complete the 85-month window as recommended by the neurologist but if no improvement seen will need to revisit goals of care. 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 Shehad gone to bedin the morning her husbandhad trouble waking her up. In the ED she was noted to be responsive to pain. A code stroke was called. She was outside the window for TPA. An emergent CT did not show any acute abnormality. A CTA was performed and did not reveal any obvious stenosis. An MRI of the brain was performed on 2/13:Punctate acute infarction in the left superior cerebellumadjacent to the superior cerebellar peduncle. Bilateral thalamicacute infarction extending back into the mid brain. This is consistent with artery of Percheron infarction.She continues to wax and wane from a mental status standpoint. She has cortrak. Palliative and neuro are following, family eventually decided for PEG which is now pending Plavix washout  Subjective: Awake but keeps eyes closed.  Attempts to verbally communicate.  Grimaces when in pain.  Continues to demonstrate significant hypertonicity including the trunk muscles noting difficulty with attempts to sit patient forward in bed to adjust clothing.  Due to pain and hypertonicity having difficulty following commands.  Objective: Vitals:   04/17/20 2333 04/18/20 0323  BP: 116/60 117/60  Pulse: 71 68  Resp: 17   Temp: 99.6 F (37.6 C) 98.4 F (36.9 C)  SpO2: 94% 96%    Intake/Output Summary (Last 24 hours) at 04/18/2020 0804 Last data filed at 04/18/2020 0630 Gross per 24 hour  Intake 785 ml  Output 775 ml  Net 10 ml   Filed Weights   04/04/20 0438 04/05/20 0500 04/08/20 0423  Weight:  81.8 kg 79.5 kg 80 kg    Exam:  Constitutional: Awake, mild to moderate distress as evidenced by ongoing pain due to hypertonicity Respiratory: Anterior lung sounds are clear to auscultation and she is stable on room air Cardiovascular: Heart sounds are normal, pulses regular, skin warm and dry and pink. Abdomen: PEG tube for tube feedings.  Abdomen soft and nontender with normoactive bowel sounds.   LBM  3/11 Genitourinary: Right nephrostomy tube with yellow urine noted in collection bag-Foley with yellow urine to bedside bag. Neurologic: Cranial nerves II through XII intact.  Clarification: Patient appears to affect cataract surgery on left eye, right pupil 2 mm and reactive.  Noted with spontaneous movement of right upper extremity today.  When alert and stimulated does have left upper extremity spasticity.  Continues to have painful hypertonicity in bilateral lower extremities Psychiatric: Awakens but does not like to open eyes.  Majority of vocalization is related to complaints of pain with monosyllabic responses such as aloe.  Does not engage in conversation therefore unable to accurately assess orientation.   Assessment/Plan: Acute problems: Bilateral Thalamic Infarcts Persistent acute metabolic encephalopathy  -due to CVA, mental status waxing and waning, at times she is minimally responsive versus talking some.  Neurology following -Initially started on Provigil with gradual up titration but unfortunately developed tremulousness and dose was discontinued.   -She had also been given a trial of amantadine but this was discontinued.   -She has a prior history of embolic infarcts with imaging on admission showed punctate acute infarction in the left superior cerebellum adjacent to the superior cerebellar peduncle.  Bilateral thalamic acute infarction extending back into the midbrain, consistent with artery of Percheron infarction.    -She had an event with asymmetric pupils on 2/25, had a repeat MRI without any new acute findings and expected evolution of known infarcts.   EEG 2/13 with moderate diffuse encephalopathy no apparent underlying seizure activity -Continue Plavix  -Statin placed on hold over the weekend in context of transaminitis -AST and ALT have trended downward but total bilirubin has increased slightly to 2.1 so we will continue to monitor LFTs frequently  Hypertonicity and  spasticity secondary to recent brain injury from stroke -Patient having significant hypertonicity of lower extremities which preclude OT and PT working effectively with her -She is also having spasticity related tremors in the upper extremities also impeding ability to participate with therapy -In addition with severe hypertonicity appears to be causing the patient pain -Skelaxin and Robaxin placed on hold given transaminitis.  LFTs improving.  Robaxin does not cause undesired side effect of transaminitis so could conceivably resume this medication. -3/10 began low-dose OxyIR 2.5 mg scheduled every 6 hours -Consulted Dr. Riley KillSwartz via secure chat to assist with initiating/titrating medications for spasticity and hypertonicity.  He plans to evaluate patient on 3/15  Recurrent urinary retention -Required repeat I/O cath-foley inserted 3/14 w/ 900 cc returned. -Possibly recurred 2/2 meds which have been dc'd  Group B strep UTI/recurrent fevers -GBS UTI from 2/12 appears to have been treated with ceftriaxone. 2/27 yeast UTI, completed fluconazole, WBC remains normal this morning  -Fever on 2/27, CXR with bibasilar opacities, urine culture showed yeast and completed fluconazole. Blood cultures1/2 with staph epidermidis (likely contaminated) -Low-grade temp 100.2 as of 3/5 overnight, afebrile since. Closely monitor -Another temp up to 101.1 on the afternoon of 3/14-pan cultured-UA abnormal-CXR unremarkable and actually demonstrates improving bibasilar atelectasis; as of a.m. of 3/15 patient is afebrile -With recurrent fevers and variable transaminitis  in a patient to has limited ability to communicate and tends to have diffuse pain we will go ahead and obtain abdominal ultrasound to ensure no biliary etiology to patient's fevers and abnormal LFTs  Goals of care discussion  -with palliative team x2 last interaction on 3/7.  At that time despite being made aware of patient's poor status and poor  prognosis her husband reported to the palliative provider that he was just not ready to let her go.  Her husband did wish for PEG placement now tentatively scheduled for 3/14 after washout of Plavix -Husband is agreeable to outpatient palliative provider follow-up in the event his wife further declines and would need referral to hospice care -Dtr in law states husband told by neurology that if pt were to improve and fully awaken it would take at least 2 months  Dysphagia/Nutrition Nutrition Status: Nutrition Problem: Inadequate oral intake Etiology: inability to eat Signs/Symptoms: NPO status Interventions: Tube feeding,Prostat Estimated body mass index is 31.24 kg/m as calculated from the following:   Height as of this encounter:  (1.6 m).   Weight as of this encounter: 80 kg. -Remains lethargic with most recent SLP evaluation on 3/7 recommended continued n.p.o. status-continue cortrack for tube feedings - Awaiting PEG 3/14  CKD 3A- AKI due to dehydration  Right Sided Hydronephrosis  -Baseline creatinine ~ 1.0-1.3 -Creatinine has stabilized with increasing free water from 100 cc to 200 cc every 6 hours -Earlier in the hospitalization due to steady rise of creatinine arenal ultrasound completed whichshowed right sided hydronephrosis(no mass or definite calcification identified).  Nephrology, urology consulted and she is status post right nephrostomy tube placement by IR on 2/21.  -Has continued to have issues with urinary retention therefore Foley catheter placed overnight into 3/14 with significant amount of urine returned i.e. greater than 900 cc  Diabetes mellitus 2 on current long-term insulin with hyperglycemia -Continue on insulin sliding scale every 4 hours while she is on tube feeds -basal insulin, bolus insulin - adjust prn -CBGs have been between 110 and 164 over the past 24 hours -HgbA1c = 7.6  Hypertension/chronic grade 2 diastolic heart failure -2D  echoperformedduring this admission reveals grade 2 diastolic dysfunction -Remains euvolemic although overall net INO less than 10,000 cc since admission -Continue Norvasc  Physical deconditioning/residual left hemiplegia from prior stroke PT eval 3/14:    Pt progressing towards physical therapy goals. Pt with eyes open throughout most of session today. She was able to show muscle initiation move LUE, pull trunk anteriorly, and to initiate push up to sitting from a propped position to L (assist required to achieve midline). Will continue to follow and progress as able per POC.     OT eval 3/15 Pt progressing slowly towards established OT goals and presenting with increased arousal this session; maintaining her eyes open when upright at EOB. Pt requiring Total A for bed mobility and Max-Total A for sitting at EOB. Pt making eye contact with therapist; noting dysconjugate gaze and R gaze/head turn. Pt requiring total hand over hand for grooming. Donned resting hand splint and check skin integrity; notified RN for wear schedule. Continue to recommend dc to SNF and will continue to follow acutely as admitted.    Other problems: Group B strep UTI/recent fevers -GBS UTI from 2/12 appears to have been treated with ceftriaxone. 2/27 yeast UTI, completed fluconazole, WBC remains normal this morning  -Fever on 2/27, CXR with bibasilar opacities, urine culture showed yeast and completed fluconazole. Blood cultures1/2 with  staph epidermidis (likely contaminated) -3/15 see above regarding recurrence of fevers and additional work-up  Intermittent agitation  -Has had issues with agitation when awake but for the most part remains lethargic -Continue liberalized visitation so family can sit with her  Gluteal skin breakdown -[place] and barrier cream -Continue air mattress to prevent further skin breakdown -turn as able Wound / Incision (Open or Dehisced) 03/27/20 Puncture Flank Right;Posterior  nephrostomy tube placement insertion site (Active)  Date First Assessed/Time First Assessed: 03/27/20 1406   Wound Type: Puncture  Location: Flank  Location Orientation: Right;Posterior  Wound Description (Comments): nephrostomy tube placement insertion site  Present on Admission: No    Assessments 03/27/2020  8:09 AM 04/17/2020  5:00 PM  Dressing Type Gauze (Comment) Gauze (Comment)  Dressing Changed New Changed  Dressing Status Clean;Dry;Intact Clean;Dry  Dressing Change Frequency -- Daily  Site / Wound Assessment Clean;Dry Clean;Dry  Closure -- Sutures  Drainage Amount -- None  Drainage Description -- No odor  Treatment -- Irrigation     No Linked orders to display     Wound / Incision (Open or Dehisced) 04/17/20 Puncture Abdomen Left;Anterior;Upper G-tube insertion site  (Active)  Date First Assessed/Time First Assessed: 04/17/20 0915   Wound Type: Puncture  Location: Abdomen  Location Orientation: Left;Anterior;Upper  Wound Description (Comments): G-tube insertion site   Present on Admission: No    Assessments 04/17/2020  7:20 AM 04/18/2020  8:27 AM  Dressing Type Gauze (Comment);Tape dressing Gauze (Comment)  Dressing Changed New --  Dressing Status Clean;Dry;Intact Clean;Dry;Intact  Dressing Change Frequency PRN PRN  Site / Wound Assessment Dressing in place / Unable to assess Dressing in place / Unable to assess  Peri-wound Assessment Intact Intact  Drainage Amount None --     No Linked orders to display    Hypothyroidism -Continue Synthroid per tube -TSH 6.651 on 2/12 and likely an acute phase reactant consistent with sick euthyroid syndrome -TSH with T3 and T4 will need to be repeated in May 2022  Hyperkalemia Resolved -Potassium normal   Data Reviewed: Basic Metabolic Panel: Recent Labs  Lab 04/14/20 0355 04/15/20 0258 04/16/20 0335 04/17/20 0202 04/18/20 0234  NA 138 136 136 135 136  K 4.3 4.5 5.0 4.9 4.8  CL 100 98 98 98 99  CO2 28 30 29 26 29   GLUCOSE  124* 112* 86 125* 169*  BUN 67* 66* 63* 60* 52*  CREATININE 1.34* 1.29* 1.26* 1.25* 1.26*  CALCIUM 10.1 9.9 9.8 9.8 9.8  MG  --   --  2.3 2.3  --   PHOS  --   --  4.3 4.5  --    Liver Function Tests: Recent Labs  Lab 04/13/20 0358 04/15/20 0258 04/16/20 0335 04/17/20 0202 04/18/20 0234  AST 23 60* 164* 63* 144*  ALT 17 29 89* 64* 67*  ALKPHOS 92 143* 329* 268* 326*  BILITOT 0.6 0.2* 0.9 0.8 2.1*  PROT 6.5 6.2* 6.4* 6.7 6.0*  ALBUMIN 2.6* 2.6* 2.7* 2.7* 2.4*   No results for input(s): LIPASE, AMYLASE in the last 168 hours. No results for input(s): AMMONIA in the last 168 hours. CBC: Recent Labs  Lab 04/12/20 0350 04/15/20 0258 04/16/20 0335 04/17/20 0202  WBC 6.6 6.3 6.7 6.2  NEUTROABS  --  3.7 4.5 4.1  HGB 11.6* 11.4* 11.4* 11.0*  HCT 35.0* 34.6* 33.7* 35.1*  MCV 97.8 98.3 97.1 99.4  PLT 389 337 356 293   Cardiac Enzymes: No results for input(s): CKTOTAL, CKMB, CKMBINDEX, TROPONINI in  the last 168 hours. BNP (last 3 results) No results for input(s): BNP in the last 8760 hours.  ProBNP (last 3 results) No results for input(s): PROBNP in the last 8760 hours.  CBG: Recent Labs  Lab 04/17/20 1145 04/17/20 1633 04/17/20 1930 04/17/20 2334 04/18/20 0324  GLUCAP 184* 119* 142* 173* 158*    Recent Results (from the past 240 hour(s))  Surgical pcr screen     Status: Abnormal   Collection Time: 04/16/20  7:28 PM   Specimen: Nasal Mucosa; Nasal Swab  Result Value Ref Range Status   MRSA, PCR NEGATIVE NEGATIVE Final   Staphylococcus aureus POSITIVE (A) NEGATIVE Final    Comment: (NOTE) The Xpert SA Assay (FDA approved for NASAL specimens in patients 44 years of age and older), is one component of a comprehensive surveillance program. It is not intended to diagnose infection nor to guide or monitor treatment. Performed at [facility], [address] [place], [place] [address]      Studies: IR GASTROSTOMY TUBE MOD SED  Result Date:  04/17/2020 CLINICAL DATA:  Recent stroke, needs enteral feeding support EXAM: PERC PLACEMENT GASTROSTOMY FLUOROSCOPY TIME:  4 minutes 6 seconds; 18 mGy TECHNIQUE: The procedure, risks, benefits, and alternatives were explained to the family. Questions regarding the procedure were encouraged and answered. The family understands and consents to the procedure. As antibiotic prophylaxis, cefazolin 2 g was ordered pre-procedure and administered intravenously within one hour of incision. A safe percutaneous approach was confirmed on previous CT imaging. A 5 French angiographic catheter was placed as orogastric tube. The upper abdomen was prepped with Betadine, draped in usual sterile fashion, and infiltrated locally with 1% lidocaine. Intravenous Fentanyl and Versed 0.5mg  were administered as conscious sedation during continuous monitoring of the patient's level of consciousness and physiological / cardiorespiratory status by the radiology RN, with a total moderate sedation time of 11 minutes. Stomach was insufflated using air through the orogastric tube. An 6 French sheath needle was advanced percutaneously into the gastric lumen under fluoroscopy. Gas could be aspirated and a small contrast injection confirmed intraluminal spread. The sheath was exchanged over a guidewire for a 9 [place] vascular sheath, through which the snare device was advanced and used to snare a guidewire passed through the orogastric tube. This was withdrawn, and the snare attached to the 20 French pull-through gastrostomy tube, which was advanced antegrade, positioned with the internal bumper securing the anterior gastric wall to the anterior abdominal wall. Small contrast injection confirms appropriate positioning. The external bumper was applied and the catheter was flushed. COMPLICATIONS: COMPLICATIONS none IMPRESSION: 1. Technically successful 20 French pull-through gastrostomy placement under fluoroscopy. Electronically Signed   By: Corlis Leak M.D.   On: 04/17/2020 10:28   DG CHEST PORT 1 VIEW  Result Date: 04/17/2020 CLINICAL DATA:  Fever. EXAM: PORTABLE CHEST 1 VIEW COMPARISON:  Radiograph 04/09/2020.  CT 03/25/2020 FINDINGS: Stable heart size and mediastinal contours. Aortic atherosclerosis, mitral annulus calcifications. Improving bibasilar atelectasis. No confluent airspace disease. No pulmonary edema, pleural effusion or pneumothorax. Bilateral shoulder arthroplasties in place. IMPRESSION: Improving bibasilar atelectasis. Electronically Signed   By: Narda Rutherford M.D.   On: 04/17/2020 19:00    Scheduled Meds: . amLODipine  5 mg Per Tube Daily  . chlorhexidine  15 mL Mouth Rinse BID  . Chlorhexidine Gluconate Cloth  6 each Topical Daily  . clopidogrel  75 mg Per Tube Daily  . feeding supplement (PROSource TF)  45 mL Per Tube TID  .  free water  200 mL Per Tube Q6H  . heparin  5,000 Units Subcutaneous Q8H  . insulin aspart  0-9 Units Subcutaneous Q4H  . insulin aspart  2 Units Subcutaneous Q4H  . insulin detemir  5 Units Subcutaneous Daily  . levothyroxine  112 mcg Per Tube QAC breakfast  . liver oil-zinc oxide   Topical BID  . mouth rinse  15 mL Mouth Rinse q12n4p  . modafinil  200 mg Per Tube Daily  . oxyCODONE  2.5 mg Per Tube Q6H  . sodium chloride flush  3 mL Intravenous Once  . sodium chloride flush  5 mL Intracatheter Q8H   Continuous Infusions: . sodium chloride 10 mL/hr at 03/24/20 0305  . dextrose 5% lactated ringers 75 mL/hr at 04/17/20 2002  . feeding supplement (GLUCERNA 1.2 CAL) Stopped (04/17/20 0011)    Principal Problem:   Acute encephalopathy Active Problems:   Diabetes mellitus (HCC)   Hypothyroidism   History of embolic stroke   Acute bilateral thalamic infarction (HCC)   Dysphagia   CKD (chronic kidney disease), symptom management only, stage 3 (moderate) (HCC)   Hydronephrosis, right   Group B streptococcal UTI   Uncontrolled type 2 diabetes mellitus with hyperglycemia, with  long-term current use of insulin (HCC)   Chronic diastolic heart failure (HCC)   Essential hypertension   Physical debility   Left hemiplegia (HCC)   Palliative care by specialist   Goals of care, counseling/discussion   DNR (do not resuscitate)   Consultants: Neurology  Palliative care Nephrology Urology IR  Procedures: Right percutaneous nephrostomy 2/21  Antibiotics: Anti-infectives (From admission, onward)   Start     Dose/Rate Route Frequency Ordered Stop   04/17/20 0807  ceFAZolin (ANCEF) 2-4 GM/100ML-% IVPB       Note to Pharmacy: Cristy Friedlander   : cabinet override      04/17/20 0807 04/17/20 0847   04/07/20 2000  fluconazole (DIFLUCAN) 10 MG/ML suspension 100 mg        100 mg Per Tube Every evening 04/07/20 1236 04/08/20 2023   04/03/20 2000  fluconazole (DIFLUCAN) 40 MG/ML suspension 100 mg  Status:  Discontinued       "Followed by" Linked Group Details   100 mg Per Tube Every 24 hours 04/02/20 1752 04/07/20 1236   04/02/20 1900  fluconazole (DIFLUCAN) 40 MG/ML suspension 200 mg       "Followed by" Linked Group Details   200 mg Per Tube  Once 04/02/20 1752 04/02/20 2020   03/26/20 0730  cefTRIAXone (ROCEPHIN) 1 g in sodium chloride 0.9 % 100 mL IVPB        1 g 200 mL/hr over 30 Minutes Intravenous Every 24 hours 03/26/20 0644 03/30/20 0844   03/18/20 1900  azithromycin (ZITHROMAX) 500 mg in sodium chloride 0.9 % 250 mL IVPB  Status:  Discontinued        500 mg 250 mL/hr over 60 Minutes Intravenous Every 24 hours 03/18/20 1830 03/19/20 1106   03/18/20 1900  cefTRIAXone (ROCEPHIN) 2 g in sodium chloride 0.9 % 100 mL IVPB  Status:  Discontinued        2 g 200 mL/hr over 30 Minutes Intravenous Every 24 hours 03/18/20 1830 03/19/20 1106       Time spent: 30 minutes    Junious Silk ANP  [facility] 7 am - 330 pm/M-F for direct patient care and secure chat Please refer to Amion for contact info 31  days

## 2020-04-18 NOTE — Plan of Care (Signed)
  Problem: Education: Goal: Knowledge of General Education information will improve Description: Including pain rating scale, medication(s)/side effects and non-pharmacologic comfort measures Outcome: Progressing   Problem: Health Behavior/Discharge Planning: Goal: Ability to manage health-related needs will improve Outcome: Progressing   Problem: Clinical Measurements: Goal: Ability to maintain clinical measurements within normal limits will improve Outcome: Progressing Goal: Will remain free from infection Outcome: Progressing Goal: Diagnostic test results will improve Outcome: Progressing Goal: Respiratory complications will improve Outcome: Progressing Goal: Cardiovascular complication will be avoided Outcome: Progressing   Problem: Activity: Goal: Risk for activity intolerance will decrease Outcome: Progressing   Problem: Nutrition: Goal: Adequate nutrition will be maintained Outcome: Progressing   Problem: Coping: Goal: Level of anxiety will decrease Outcome: Progressing   Problem: Elimination: Goal: Will not experience complications related to bowel motility Outcome: Progressing Goal: Will not experience complications related to urinary retention Outcome: Progressing   Problem: Pain Managment: Goal: General experience of comfort will improve Outcome: Progressing   Problem: Safety: Goal: Ability to remain free from injury will improve Outcome: Progressing   Problem: Skin Integrity: Goal: Risk for impaired skin integrity will decrease Outcome: Progressing   Problem: Safety: Goal: Non-violent Restraint(s) Outcome: Progressing   Problem: Education: Goal: Knowledge of disease or condition will improve Outcome: Progressing Goal: Knowledge of secondary prevention will improve Outcome: Progressing Goal: Knowledge of patient specific risk factors addressed and post discharge goals established will improve Outcome: Progressing Goal: Individualized  Educational Video(s) Outcome: Progressing   Problem: Coping: Goal: Will verbalize positive feelings about self Outcome: Progressing   Problem: Health Behavior/Discharge Planning: Goal: Ability to manage health-related needs will improve Outcome: Progressing   

## 2020-04-18 NOTE — Progress Notes (Signed)
Referring Physician(s): Dr. Elvera Lennox  Supervising Physician: Malachy Moan  Patient Status:  [facility] - In-pt  Chief Complaint:  Dysphagia s/p 20 Fr pull through placed on 3.15.22 by Dr. Deanne Coffer  Subjective:  Unable to asses  Allergies: Atorvastatin, Ezetimibe-simvastatin, Lovastatin, Metformin hcl, Pravastatin, Codeine, Simvastatin, Sulfa antibiotics, and Sulfites  Medications: Prior to Admission medications   Medication Sig Start Date End Date Taking? Authorizing Provider  amLODipine (NORVASC) 5 MG tablet Take 5 mg by mouth every morning. 12/22/19  Yes [provider]  B Complex Vitamins (VITAMIN B COMPLEX) TABS Take 1 tablet by mouth daily after breakfast.   Yes [provider]  Cholecalciferol (VITAMIN D3) 2000 UNITS TABS Take 2,000 Units by mouth daily.    Yes [provider]  clopidogrel (PLAVIX) 75 MG tablet Take 1 tablet (75 mg total) by mouth daily with breakfast. 11/17/11  Yes Vassie Loll, MD  DULoxetine (CYMBALTA) 30 MG capsule TAKE 1 CAPSULE(30 MG) BY MOUTH DAILY Patient taking differently: Take 30 mg by mouth daily. 08/25/19  Yes Marcello Fennel, MD  levothyroxine (SYNTHROID) 112 MCG tablet Take 112 mcg by mouth daily before breakfast.   Yes [provider]  losartan (COZAAR) 100 MG tablet Take 100 mg by mouth daily. 04/29/19  Yes [provider]  nitrofurantoin (MACRODANTIN) 100 MG capsule Take 100 mg by mouth daily. 03/09/20  Yes [provider]  Omega-3 Fatty Acids (FISH OIL) 1000 MG CAPS Take 1,000 mg by mouth 2 (two) times daily.   Yes [provider]  pioglitazone (ACTOS) 45 MG tablet Take 45 mg by mouth daily.   Yes [provider]  propranolol (INDERAL) 40 MG tablet Take 40 mg by mouth daily.  04/13/17  Yes [provider]  traZODone (DESYREL) 50 MG tablet TAKE 1 TABLET BY MOUTH AT BEDTIME Patient taking differently: Take 50 mg by mouth at bedtime. 07/01/17  Yes Patel, Maryln Gottron, MD  levothyroxine (SYNTHROID, LEVOTHROID) 100 MCG tablet Take 1 tablet (100 mcg total) by mouth daily. Patient not taking: No sig reported 11/17/11   Vassie Loll, MD  methocarbamol (ROBAXIN) 500 MG tablet Take 1 tablet (500 mg total) by mouth every 8 (eight) hours as needed for muscle spasms. Patient not taking: No sig reported 04/13/19   Marcello Fennel, MD  rosuvastatin (CRESTOR) 5 MG tablet Take 1 tablet (5 mg total) by mouth 2 (two) times a week for 30 doses. 11/29/19 03/10/20  Azucena Fallen, MD     Vital Signs: BP 140/62 (BP Location: Right Arm)   Pulse 91   Temp 98.1 F (36.7 C) (Axillary)   Resp 16   Ht 5\' 3"  (1.6 m)   Wt 176 lb 5.9 oz (80 kg)   SpO2 99%   BMI 31.24 kg/m   Physical Exam Vitals and nursing note reviewed.  Constitutional:      Appearance: She is well-developed.  HENT:     Head: Normocephalic and atraumatic.  Eyes:     Conjunctiva/sclera: Conjunctivae normal.  Pulmonary:     Effort: Pulmonary effort is normal.  Abdominal:     Comments: Insertion site is clean, dry, dressed and appropriately tender to palpation.     Musculoskeletal:     Cervical back: Normal range of motion.  Neurological:     Mental Status: She is alert and oriented to person, place, and time.     Imaging: IR GASTROSTOMY TUBE MOD SED  Result Date: 04/17/2020 CLINICAL DATA:  Recent stroke, needs enteral  feeding support EXAM: PERC PLACEMENT GASTROSTOMY FLUOROSCOPY TIME:  4 minutes 6 seconds; 18 mGy TECHNIQUE: The procedure, risks, benefits, and alternatives were explained to the family. Questions regarding the procedure were encouraged and answered. The family understands and consents to the procedure. As antibiotic prophylaxis, cefazolin 2 g was ordered pre-procedure and administered intravenously within one hour of incision. A safe percutaneous approach was confirmed on previous CT imaging. A 5 French angiographic catheter was placed as orogastric tube. The upper  abdomen was prepped with Betadine, draped in usual sterile fashion, and infiltrated locally with 1% lidocaine. Intravenous Fentanyl and Versed 0.5mg  were administered as conscious sedation during continuous monitoring of the patient's level of consciousness and physiological / cardiorespiratory status by the radiology RN, with a total moderate sedation time of 11 minutes. Stomach was insufflated using air through the orogastric tube. An 71 French sheath needle was advanced percutaneously into the gastric lumen under fluoroscopy. Gas could be aspirated and a small contrast injection confirmed intraluminal spread. The sheath was exchanged over a guidewire for a 9 [place] vascular sheath, through which the snare device was advanced and used to snare a guidewire passed through the orogastric tube. This was withdrawn, and the snare attached to the 20 French pull-through gastrostomy tube, which was advanced antegrade, positioned with the internal bumper securing the anterior gastric wall to the anterior abdominal wall. Small contrast injection confirms appropriate positioning. The external bumper was applied and the catheter was flushed. COMPLICATIONS: COMPLICATIONS none IMPRESSION: 1. Technically successful 20 French pull-through gastrostomy placement under fluoroscopy. Electronically Signed   By: Corlis Leak M.D.   On: 04/17/2020 10:28   DG CHEST PORT 1 VIEW  Result Date: 04/17/2020 CLINICAL DATA:  Fever. EXAM: PORTABLE CHEST 1 VIEW COMPARISON:  Radiograph 04/09/2020.  CT 03/25/2020 FINDINGS: Stable heart size and mediastinal contours. Aortic atherosclerosis, mitral annulus calcifications. Improving bibasilar atelectasis. No confluent airspace disease. No pulmonary edema, pleural effusion or pneumothorax. Bilateral shoulder arthroplasties in place. IMPRESSION: Improving bibasilar atelectasis. Electronically Signed   By: Narda Rutherford M.D.   On: 04/17/2020 19:00    Labs:  CBC: Recent Labs     04/12/20 0350 04/15/20 0258 04/16/20 0335 04/17/20 0202  WBC 6.6 6.3 6.7 6.2  HGB 11.6* 11.4* 11.4* 11.0*  HCT 35.0* 34.6* 33.7* 35.1*  PLT 389 337 356 293    COAGS: Recent Labs    11/23/19 2304 03/18/20 1420 03/27/20 1009 04/17/20 0202  INR 0.9 1.0 1.0 1.1  APTT 27 26  --   --     BMP: Recent Labs    04/15/20 0258 04/16/20 0335 04/17/20 0202 04/18/20 0234  NA 136 136 135 136  K 4.5 5.0 4.9 4.8  CL 98 98 98 99  CO2 30 29 26 29   GLUCOSE 112* 86 125* 169*  BUN 66* 63* 60* 52*  CALCIUM 9.9 9.8 9.8 9.8  CREATININE 1.29* 1.26* 1.25* 1.26*  GFRNONAA 41* 42* 42* 42*    LIVER FUNCTION TESTS: Recent Labs    04/15/20 0258 04/16/20 0335 04/17/20 0202 04/18/20 0234  BILITOT 0.2* 0.9 0.8 2.1*  AST 60* 164* 63* 144*  ALT 29 89* 64* 67*  ALKPHOS 143* 329* 268* 326*  PROT 6.2* 6.4* 6.7 6.0*  ALBUMIN 2.6* 2.7* 2.7* 2.4*    Assessment and Plan:  44 y.,o. female inpatient. History of CVA, HTN, DM. Admitted for lethargy found to be a code stroke with residual left sided weakness, encephalopathy and dysphagia. IR placed a 20 Fr gastrostomy tube on  3.15.22 with Dr. Deanne Coffer.   Insertion site is clean, dry, dressed and appropriately tender to palpation. Tube is capped wall.  No bleeding or leakage.     Ok to begin using g-tube for medicines, tube feeds, free water, etc.  Please call IR for any questions or concerns regarding the g-tube.     Electronically Signed: Alene Mires, NP 04/18/2020, 11:22 AM   I spent a total of 15 Minutes at the patient's bedside AND on the patient's hospital floor or unit, greater than 50% of which was counseling/coordinating care for gastrostomy tube placement

## 2020-04-19 DIAGNOSIS — E039 Hypothyroidism, unspecified: Secondary | ICD-10-CM

## 2020-04-19 DIAGNOSIS — N390 Urinary tract infection, site not specified: Secondary | ICD-10-CM

## 2020-04-19 DIAGNOSIS — B955 Unspecified streptococcus as the cause of diseases classified elsewhere: Secondary | ICD-10-CM

## 2020-04-19 DIAGNOSIS — N179 Acute kidney failure, unspecified: Secondary | ICD-10-CM | POA: Diagnosis not present

## 2020-04-19 DIAGNOSIS — I639 Cerebral infarction, unspecified: Secondary | ICD-10-CM | POA: Diagnosis not present

## 2020-04-19 DIAGNOSIS — N1330 Unspecified hydronephrosis: Secondary | ICD-10-CM

## 2020-04-19 DIAGNOSIS — G9340 Encephalopathy, unspecified: Secondary | ICD-10-CM | POA: Diagnosis not present

## 2020-04-19 DIAGNOSIS — R5381 Other malaise: Secondary | ICD-10-CM

## 2020-04-19 DIAGNOSIS — I5032 Chronic diastolic (congestive) heart failure: Secondary | ICD-10-CM | POA: Diagnosis not present

## 2020-04-19 DIAGNOSIS — R1310 Dysphagia, unspecified: Secondary | ICD-10-CM

## 2020-04-19 DIAGNOSIS — R509 Fever, unspecified: Secondary | ICD-10-CM

## 2020-04-19 DIAGNOSIS — N183 Chronic kidney disease, stage 3 unspecified: Secondary | ICD-10-CM

## 2020-04-19 LAB — CBC WITH DIFFERENTIAL/PLATELET
Abs Immature Granulocytes: 0.03 10*3/uL (ref 0.00–0.07)
Basophils Absolute: 0.1 10*3/uL (ref 0.0–0.1)
Basophils Relative: 1 %
Eosinophils Absolute: 0.4 10*3/uL (ref 0.0–0.5)
Eosinophils Relative: 7 %
HCT: 31.9 % — ABNORMAL LOW (ref 36.0–46.0)
Hemoglobin: 10.3 g/dL — ABNORMAL LOW (ref 12.0–15.0)
Immature Granulocytes: 1 %
Lymphocytes Relative: 19 %
Lymphs Abs: 1 10*3/uL (ref 0.7–4.0)
MCH: 32.2 pg (ref 26.0–34.0)
MCHC: 32.3 g/dL (ref 30.0–36.0)
MCV: 99.7 fL (ref 80.0–100.0)
Monocytes Absolute: 0.5 10*3/uL (ref 0.1–1.0)
Monocytes Relative: 10 %
Neutro Abs: 3.4 10*3/uL (ref 1.7–7.7)
Neutrophils Relative %: 62 %
Platelets: 250 10*3/uL (ref 150–400)
RBC: 3.2 MIL/uL — ABNORMAL LOW (ref 3.87–5.11)
RDW: 13.5 % (ref 11.5–15.5)
WBC: 5.3 10*3/uL (ref 4.0–10.5)
nRBC: 0 % (ref 0.0–0.2)

## 2020-04-19 LAB — BLOOD CULTURE ID PANEL (REFLEXED) - BCID2

## 2020-04-19 LAB — COMPREHENSIVE METABOLIC PANEL
ALT: 67 U/L — ABNORMAL HIGH (ref 0–44)
AST: 65 U/L — ABNORMAL HIGH (ref 15–41)
Albumin: 2.2 g/dL — ABNORMAL LOW (ref 3.5–5.0)
Alkaline Phosphatase: 287 U/L — ABNORMAL HIGH (ref 38–126)
Anion gap: 7 (ref 5–15)
BUN: 44 mg/dL — ABNORMAL HIGH (ref 8–23)
CO2: 29 mmol/L (ref 22–32)
Calcium: 9.6 mg/dL (ref 8.9–10.3)
Chloride: 102 mmol/L (ref 98–111)
Creatinine, Ser: 1.08 mg/dL — ABNORMAL HIGH (ref 0.44–1.00)
GFR, Estimated: 50 mL/min — ABNORMAL LOW (ref >60)
Glucose, Bld: 179 mg/dL — ABNORMAL HIGH (ref 70–99)
Potassium: 4 mmol/L (ref 3.5–5.1)
Sodium: 138 mmol/L (ref 135–145)
Total Bilirubin: 0.7 mg/dL (ref 0.3–1.2)
Total Protein: 5.8 g/dL — ABNORMAL LOW (ref 6.5–8.1)

## 2020-04-19 LAB — GLUCOSE, CAPILLARY
Glucose-Capillary: 143 mg/dL — ABNORMAL HIGH (ref 70–99)
Glucose-Capillary: 145 mg/dL — ABNORMAL HIGH (ref 70–99)
Glucose-Capillary: 152 mg/dL — ABNORMAL HIGH (ref 70–99)
Glucose-Capillary: 165 mg/dL — ABNORMAL HIGH (ref 70–99)
Glucose-Capillary: 171 mg/dL — ABNORMAL HIGH (ref 70–99)
Glucose-Capillary: 178 mg/dL — ABNORMAL HIGH (ref 70–99)

## 2020-04-19 LAB — LIPASE, BLOOD: Lipase: 31 U/L (ref 11–51)

## 2020-04-19 MED ORDER — MUPIROCIN 2 % EX OINT
1.0000 "application " | TOPICAL_OINTMENT | Freq: Two times a day (BID) | CUTANEOUS | Status: AC
  Administered 2020-04-19 – 2020-04-23 (×10): 1 via NASAL
  Filled 2020-04-19: qty 22

## 2020-04-19 NOTE — Progress Notes (Signed)
PROGRESS NOTE    ALIS SAWCHUK  ZOX:[id number] DOB: 05-11-1932 DOA: 03/18/2020 PCP: Sigmund Hazel, MD   Chief Complaint  Patient presents with  . 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 Shehad gone to bedin the morning her husbandhad trouble waking her up. In the ED she was noted to be responsive to pain. A code stroke was called. She was outside the window for TPA. An emergent CT did not show any acute abnormality. A CTA was performed and did not reveal any obvious stenosis. An MRI of the brain was performed on 2/13:Punctate acute infarction in the left superior cerebellumadjacent to the superior cerebellar peduncle. Bilateral thalamicacute infarction extending back into the mid brain. This is consistent with artery of Percheron infarction.She continues to wax and wane from a mental status standpoint. She had cortrak and subsequently underwent PEG tube placement.. Palliative and neuro are following,   Assessment & Plan:   Principal Problem:   Acute encephalopathy Active Problems:   Diabetes mellitus (HCC)   Hypothyroidism   History of embolic stroke   Acute bilateral thalamic infarction (HCC)   Dysphagia   CKD (chronic kidney disease), symptom management only, stage 3 (moderate) (HCC)   Hydronephrosis, right   Group B streptococcal UTI   Uncontrolled type 2 diabetes mellitus with hyperglycemia, with long-term current use of insulin (HCC)   Chronic diastolic heart failure (HCC)   Essential hypertension   Physical debility   Left hemiplegia (HCC)   Palliative care by specialist   Goals of care, counseling/discussion   DNR (do not resuscitate)  #1 bilateral thalamic infarct/persistent acute metabolic encephalopathy Secondary to acute CVA. -Patient noted to have some  waxing and waning mental status and at times minimally responsive versus others where she is noted to talk. -Patient initially noted to be on Provigil which was uptitrated however patient developed tremulousness and subsequently discontinued. -Patient also underwent trial of amantadine which was discontinued. -Patient with prior history of embolic infarcts imaging on admission showed punctate acute infarct in the left superior cerebellum adjacent to superior cerebellar peduncle.  Bilateral thalamic acute infarct extending back into the midbrain consistent with artery of Percheron infarction. -Patient noted to have an acute event with asymmetric pupils 2/25 underwent repeat MRI with no acute findings however expected evolution of known infarcts. -EEG done 03/19/2020 with moderate diffuse encephalopathy with no apparent underlying seizure activity. -Continue Plavix for secondary stroke prophylaxis. -Statin on hold secondary to transaminitis.  LFTs trending down.  Follow.  2.  Hypertonicity and spasticity secondary to recent brain injury from stroke -Patient noted to have significant hypertonicity lower extremities affecting working with PT and OT efficiently. -Patient also noted to have spasticity related tremors in upper extremities -Skelaxin and Robaxin held secondary to transaminitis.  LFTs trending down. -Could possibly resume Robaxin. -Continue current regimen of low-dose OxyIR 2.5 mg every 6 hours and wean. -Dr. Riley Kill, consulted via secure chat per Junious Silk, NP to assist with initiating/titrating medications for spasticity and hypertonicity.  3.  Recurrent urinary retention Patient noted to have required I/O cath. -Foley catheter inserted 04/17/2020 with 900 cc of urine returning. -Concerned that urinary retention may be secondary to medications which have been discontinued. -Urine output of 1 L with 475 from Foley catheter on 525 from right nephrostomy. -Voiding trial in about 5  days.  4.  Group  B strep UTI/recurrent fevers -Patient noted to have GBS UTI from 212 status post treatment with IV Rocephin. -2/27 noted to have yeast UTI status post course of fluconazole -Patient noted to have fever 227, chest x-ray with bibasilar opacities, urine culture with yeast status post completion of fluconazole. -Blood cultures 1/2 with staph epidermis likely contamination. -Patient noted to have low-grade temp 100.2 on 3/5 and temp of 101.1 314 and subsequently pancultured. -Urinalysis abnormal, chest x-ray unremarkable with improvement of bibasilar atelectasis. -Fever curve trending down. -Urine cultures pending. -Hold off on antibiotics at this time.  5.  Dysphagia/nutrition -Patient noted to have inadequate oral intake. -Patient had a core track now PEG tube has been placed 04/17/2020 and tube feeds started. -Dietitian consulted.  6.  AKI on chronic kidney disease stage IIIA/right-sided hydronephrosis -Baseline creatinine 1-1.3. -Early on during the hospitalization patient noted to have worsening renal function renal ultrasound done consistent with a severe right-sided hydronephrosis. -Neurology/urology consulted patient status post right nephrostomy tube placement per IR 03/27/2020 -Due to urinary retention Foley catheter was placed. -Abdominal ultrasound on 04/18/2020 with resolution of previously noted right hydronephrosis on CT. -Creatinine improving and currently around baseline of 1.08. -Outpatient follow-up with urology.  7.  Diabetes mellitus type 2 Hemoglobin A1c 7.6.  CBG 143 this morning. -Continue Levemir 5 units daily.  Sliding scale insulin.  8.  Chronic diastolic heart failure/hypertension 2D echo with grade 2 diastolic dysfunction. -Currently euvolemic. -Continue Norvasc, Plavix.  9.  Residual left hemiplegia from prior stroke/debility/deconditioning -PT/OT. -Likely needs SNF.  10.  Gluteal skin breakdown -Barrier cream, Desitin -Air  mattress -Frequent turns.  11.  Hypothyroidism -Synthroid.  Outpatient follow-up.  12.  Hyperkalemia Resolved.        DVT prophylaxis: Heparin Code Status: DNR Family Communication: No family at bedside. Disposition:   Status is: Inpatient    Dispo: The patient is from: Home              Anticipated d/c is to: SNF              Patient currently Patient very slow to progress with PT and OT and unclear if she is an appropriate rehab candidate for SNF or will need long-term placement.  Had PEG tube placed on 3/8.  Family has confirmed that they do not have the resources to pay for skilled nursing facility services out-of-pocket once insurance benefits exhausted.   Difficult to place patient yes       Consultants:   Interventional radiology: Dr. Grace Isaac 04/11/2020  Urology: Dr. Marlou Porch 03/25/2020  Nephrology: Dr. Melanee Spry 03/25/2020  Palliative care: Lamarr Lulas 2, NP 03/21/2020  Neurology: Dr. Pearlean Brownie 03/18/2020  Procedures:   Right percutaneous nephrostomy 03/28/2019  CT angiogram head and neck 03/18/2020  MRI brain 03/19/2020, 03/31/2020  Gastrostomy tube placement under fluoroscopy per Dr. Deanne Coffer, interventional radiology 04/17/2020  Abdominal ultrasound 04/18/2020  Renal ultrasound 03/25/2020, 03/27/2020  CT chest abdomen and pelvis 03/25/2020  CT head 03/18/2020  MR venogram head with and without contrast 03/21/2020  Antimicrobials:  Anti-infectives (From admission, onward)   Start     Dose/Rate Route Frequency Ordered Stop   04/17/20 0807  ceFAZolin (ANCEF) 2-4 GM/100ML-% IVPB       Note to Pharmacy: Cristy Friedlander   : cabinet override      04/17/20 0807 04/17/20 0847   04/07/20 2000  fluconazole (DIFLUCAN) 10 MG/ML suspension 100 mg        100 mg Per Tube Every evening 04/07/20 1236 04/08/20 2023  04/03/20 2000  fluconazole (DIFLUCAN) 40 MG/ML suspension 100 mg  Status:  Discontinued       "Followed by" Linked Group Details   100 mg Per Tube Every 24  hours 04/02/20 1752 04/07/20 1236   04/02/20 1900  fluconazole (DIFLUCAN) 40 MG/ML suspension 200 mg       "Followed by" Linked Group Details   200 mg Per Tube  Once 04/02/20 1752 04/02/20 2020   03/26/20 0730  cefTRIAXone (ROCEPHIN) 1 g in sodium chloride 0.9 % 100 mL IVPB        1 g 200 mL/hr over 30 Minutes Intravenous Every 24 hours 03/26/20 0644 03/30/20 0844   03/18/20 1900  azithromycin (ZITHROMAX) 500 mg in sodium chloride 0.9 % 250 mL IVPB  Status:  Discontinued        500 mg 250 mL/hr over 60 Minutes Intravenous Every 24 hours 03/18/20 1830 03/19/20 1106   03/18/20 1900  cefTRIAXone (ROCEPHIN) 2 g in sodium chloride 0.9 % 100 mL IVPB  Status:  Discontinued        2 g 200 mL/hr over 30 Minutes Intravenous Every 24 hours 03/18/20 1830 03/19/20 1106        Subjective: Awake, alert.  Monosyllables occasionally.  Objective: Vitals:   04/18/20 2322 04/19/20 0343 04/19/20 0814 04/19/20 1100  BP: 120/65 114/64 120/68 127/65  Pulse: 69 73 76 87  Resp: Temp: 98.6 F (37 C) 97.7 F (36.5 C) 98.3 F (36.8 C) 99 F (37.2 C)  TempSrc: Oral Oral Oral Axillary  SpO2: 95% 96% 94% 99%  Weight:      Height:        Intake/Output Summary (Last 24 hours) at 04/19/2020 1257 Last data filed at 04/19/2020 0700 Gross per 24 hour  Intake 559.75 ml  Output 825 ml  Net -265.25 ml   Filed Weights   04/04/20 0438 04/05/20 0500 04/08/20 0423  Weight: 81.8 kg 79.5 kg 80 kg    Examination:  General exam: Awake.Marland Kitchen Respiratory system: Clear to auscultation anterior lung fields. Respiratory effort normal. Cardiovascular system: S1 & S2 heard, RRR. No JVD, murmurs, rubs, gallops or clicks. No pedal edema. Gastrointestinal system: Abdomen is nondistended, soft and nontender. No organomegaly or masses felt. Normal bowel sounds heard. Central nervous system: Alert.  Moving left upper extremity.  Spasticity right upper extremity.  Hypertonicity with pain, bilateral lower  extremities.  Extremities: Symmetric 5 x 5 power. Skin: No rashes, lesions or ulcers Psychiatry: Judgement and insight unable to assess. Mood & affect appropriate.     Data Reviewed: I have personally reviewed following labs and imaging studies  CBC: Recent Labs  Lab 04/15/20 0258 04/16/20 0335 04/17/20 0202 04/19/20 0421  WBC 6.3 6.7 6.2 5.3  NEUTROABS 3.7 4.5 4.1 3.4  HGB 11.4* 11.4* 11.0* 10.3*  HCT 34.6* 33.7* 35.1* 31.9*  MCV 98.3 97.1 99.4 99.7  PLT 337 356 293 250    Basic Metabolic Panel: Recent Labs  Lab 04/15/20 0258 04/16/20 0335 04/17/20 0202 04/18/20 0234 04/19/20 0421  NA 136 136 135 136 138  K 4.5 5.0 4.9 4.8 4.0  CL 98 98 98 99 102  CO2 GLUCOSE 112* 86 125* 169* 179*  BUN 66* 63* 60* 52* 44*  CREATININE 1.29* 1.26* 1.25* 1.26* 1.08*  CALCIUM 9.9 9.8 9.8 9.8 9.6  MG  --  2.3 2.3  --   --   PHOS  --  4.3 4.5  --   --  GFR: Estimated Creatinine Clearance: 38.1 mL/min (A) (by C-G formula based on SCr of 1.08 mg/dL (H)).  Liver Function Tests: Recent Labs  Lab 04/15/20 0258 04/16/20 0335 04/17/20 0202 04/18/20 0234 04/19/20 0421  AST 60* 164* 63* 144* 65*  ALT 29 89* 64* 67* 67*  ALKPHOS 143* 329* 268* 326* 287*  BILITOT 0.2* 0.9 0.8 2.1* 0.7  PROT 6.2* 6.4* 6.7 6.0* 5.8*  ALBUMIN 2.6* 2.7* 2.7* 2.4* 2.2*    CBG: Recent Labs  Lab 04/18/20 2104 04/18/20 2320 04/19/20 0344 04/19/20 0811 04/19/20 1131  GLUCAP 105* 121* 165* 143* 145*     Recent Results (from the past 240 hour(s))  Surgical pcr screen     Status: Abnormal   Collection Time: 04/16/20  7:28 PM   Specimen: Nasal Mucosa; Nasal Swab  Result Value Ref Range Status   MRSA, PCR NEGATIVE NEGATIVE Final   Staphylococcus aureus POSITIVE (A) NEGATIVE Final    Comment: (NOTE) The Xpert SA Assay (FDA approved for NASAL specimens in patients 74 years of age and older), is one component of a comprehensive surveillance program. It is not intended to  diagnose infection nor to guide or monitor treatment. Performed at [facility], [address], [place], [place] [address]   Culture, blood (routine x 2)     Status: None (Preliminary result)   Collection Time: 04/17/20  4:48 PM   Specimen: BLOOD  Result Value Ref Range Status   Specimen Description BLOOD LEFT ANTECUBITAL  Final   Special Requests   Final    BOTTLES DRAWN AEROBIC ONLY Blood Culture adequate volume   Culture   Final    NO GROWTH 2 DAYS Performed at [facility], [address] [place], [place] [address]    Report Status PENDING  Incomplete  Culture, blood (routine x 2)     Status: None (Preliminary result)   Collection Time: 04/17/20  4:48 PM   Specimen: BLOOD RIGHT HAND  Result Value Ref Range Status   Specimen Description BLOOD RIGHT HAND  Final   Special Requests   Final    BOTTLES DRAWN AEROBIC ONLY Blood Culture adequate volume   Culture  Setup Time   Final    AEROBIC BOTTLE ONLY GRAM POSITIVE COCCI CRITICAL RESULT CALLED TO, READ BACK BY AND VERIFIED WITH: Marissa Nestle  [id number] [phone] FCP Performed at [facility], [address], [place], [place] [address]    Culture GRAM POSITIVE COCCI  Final   Report Status PENDING  Incomplete  Blood Culture ID Panel (Reflexed)     Status: Abnormal   Collection Time: 04/17/20  4:48 PM  Result Value Ref Range Status   Enterococcus faecalis NOT DETECTED NOT DETECTED Final   Enterococcus Faecium NOT DETECTED NOT DETECTED Final   Listeria monocytogenes NOT DETECTED NOT DETECTED Final   Staphylococcus species DETECTED (A) NOT DETECTED Final    Comment: CRITICAL RESULT CALLED TO, READ BACK BY AND VERIFIED WITH: PHARMD THOMAS J.  [phone] FCP    Staphylococcus aureus (BCID) NOT DETECTED NOT DETECTED Final   Staphylococcus epidermidis DETECTED (A) NOT DETECTED Final    Comment: Methicillin (oxacillin) resistant coagulase negative staphylococcus. Possible blood culture contaminant (unless isolated  from more than one blood culture draw or clinical case suggests pathogenicity). No antibiotic treatment is indicated for blood  culture contaminants. CRITICAL RESULT CALLED TO, READ BACK BY AND VERIFIED WITH: Marissa Nestle  [id number] [phone] FCP    Staphylococcus lugdunensis NOT  DETECTED NOT DETECTED Final   Streptococcus species NOT DETECTED NOT DETECTED Final   Streptococcus agalactiae NOT DETECTED NOT DETECTED Final   Streptococcus pneumoniae NOT DETECTED NOT DETECTED Final   Streptococcus pyogenes NOT DETECTED NOT DETECTED Final   A.calcoaceticus-baumannii NOT DETECTED NOT DETECTED Final   Bacteroides fragilis NOT DETECTED NOT DETECTED Final   Enterobacterales NOT DETECTED NOT DETECTED Final   Enterobacter cloacae complex NOT DETECTED NOT DETECTED Final   Escherichia coli NOT DETECTED NOT DETECTED Final   Klebsiella aerogenes NOT DETECTED NOT DETECTED Final   Klebsiella oxytoca NOT DETECTED NOT DETECTED Final   Klebsiella pneumoniae NOT DETECTED NOT DETECTED Final   Proteus species NOT DETECTED NOT DETECTED Final   Salmonella species NOT DETECTED NOT DETECTED Final   Serratia marcescens NOT DETECTED NOT DETECTED Final   Haemophilus influenzae NOT DETECTED NOT DETECTED Final   Neisseria meningitidis NOT DETECTED NOT DETECTED Final   Pseudomonas aeruginosa NOT DETECTED NOT DETECTED Final   Stenotrophomonas maltophilia NOT DETECTED NOT DETECTED Final   Candida albicans NOT DETECTED NOT DETECTED Final   Candida auris NOT DETECTED NOT DETECTED Final   Candida glabrata NOT DETECTED NOT DETECTED Final   Candida krusei NOT DETECTED NOT DETECTED Final   Candida parapsilosis NOT DETECTED NOT DETECTED Final   Candida tropicalis NOT DETECTED NOT DETECTED Final   Cryptococcus neoformans/gattii NOT DETECTED NOT DETECTED Final   Methicillin resistance mecA/C DETECTED (A) NOT DETECTED Final    Comment: CRITICAL RESULT CALLED TO, READ BACK BY AND VERIFIED WITH: Marissa Nestle  [id number] [phone]  FCP Performed at [facility], [address], [place], [place] [address]   Culture, Urine     Status: Abnormal (Preliminary result)   Collection Time: 04/18/20  3:57 AM   Specimen: Urine, Catheterized  Result Value Ref Range Status   Specimen Description URINE, CATHETERIZED  Final   Special Requests NONE  Final   Culture (A)  Final    80,000 COLONIES/mL STAPHYLOCOCCUS AUREUS SUSCEPTIBILITIES TO FOLLOW Performed at [facility], [address], [place], [place] [address]    Report Status PENDING  Incomplete         Radiology Studies: US Abdomen Complete  Result Date: 04/18/2020 CLINICAL DATA:  Abnormal labs EXAM: ABDOMEN ULTRASOUND COMPLETE COMPARISON:  CT 03/25/2020 FINDINGS: Gallbladder: Surgically absent Common bile duct: Diameter: 8 mm Liver: Echogenicity within normal limits. No focal hepatic abnormality. Portal vein is patent on color Doppler imaging with normal direction of blood flow towards the liver. IVC: No abnormality visualized. Pancreas: Pancreatic duct is mildly dilated. Spleen: Size and appearance within normal limits. Right Kidney: Length: 8.9 cm. Echogenicity within normal limits. No mass or hydronephrosis visualized. Left Kidney: Length: 8.5 cm. Echogenicity within normal limits. No mass or hydronephrosis visualized. Abdominal aorta: No aneurysm visualized. Other findings: None. IMPRESSION: 1. Status post cholecystectomy. Common duct diameter within normal limits for post cholecystectomy patient. 2. Slight dilatation of pancreatic duct. 3. Previously noted right hydronephrosis on CT has resolved in this patient with history of percutaneous nephrostomy. Electronically Signed   By: Jasmine Pang M.D.   On: 04/18/2020 20:35   DG CHEST PORT 1 VIEW  Result Date: 04/17/2020 CLINICAL DATA:  Fever. EXAM: PORTABLE CHEST 1 VIEW COMPARISON:  Radiograph 04/09/2020.  CT 03/25/2020 FINDINGS: Stable heart size and mediastinal contours. Aortic atherosclerosis, mitral  annulus calcifications. Improving bibasilar atelectasis. No confluent airspace disease. No pulmonary edema, pleural effusion or pneumothorax. Bilateral shoulder arthroplasties in place. IMPRESSION: Improving bibasilar atelectasis. Electronically  Signed   By: Narda RutherfordMelanie  Sanford M.D.   On: 04/17/2020 19:00        Scheduled Meds: . amLODipine  5 mg Per Tube Daily  . chlorhexidine  15 mL Mouth Rinse BID  . Chlorhexidine Gluconate Cloth  6 each Topical Daily  . clopidogrel  75 mg Per Tube Daily  . feeding supplement (PROSource TF)  45 mL Per Tube TID  . free water  200 mL Per Tube Q6H  . heparin  5,000 Units Subcutaneous Q8H  . insulin aspart  0-9 Units Subcutaneous Q4H  . insulin aspart  2 Units Subcutaneous Q4H  . insulin detemir  5 Units Subcutaneous Daily  . levothyroxine  112 mcg Per Tube QAC breakfast  . liver oil-zinc oxide   Topical BID  . mouth rinse  15 mL Mouth Rinse q12n4p  . modafinil  200 mg Per Tube Daily  . mupirocin ointment  1 application Nasal BID  . oxyCODONE  2.5 mg Per Tube Q6H  . sodium chloride flush  5 mL Intracatheter Q8H   Continuous Infusions: . sodium chloride 10 mL/hr at 03/24/20 0305  . feeding supplement (GLUCERNA 1.2 CAL) 1,000 mL (04/18/20 1847)     LOS: 32 days    Time spent: 40 minutes    Ramiro Harvestaniel Thompson, MD [facility]   To contact the attending provider between 7A-7P or the covering provider during after hours 7P-7A, please log into the web site [email or web address] and access using universal [place] password for that web site. If you do not have the password, please call the hospital operator.  04/19/2020, 12:57 PM

## 2020-04-19 NOTE — Progress Notes (Signed)
Physical Therapy Treatment Patient Details Name: Hannah Fischer MRN: [id number] DOB: July 30, 1932 Today's Date: 04/19/2020    History of Present Illness Pt is an 87 y/o female admitted 03/18/20 with CVA. Imaging (+) for bilateral medial thalamic and punctate infarcts and L superior cerebellar infarcts. PEG placed 3/14. PMH significant for multiple CVAs with most recent ~3 months ago, residual L-sided hemiplegia with recent d/c from outpatient PT/OT with return to baseline level of function, DM, PE, HTN.    PT Comments    Pt minimally responsive this session, not opening eyes and only responding to noxious stimuli. Pt appeared to try and say her name when asked at beginning of session but no other verbalizations attempted. Noted pt with swelling in dorsal aspect of L hand where palm protector placed. It was removed and retrograde massage performed for edema management. RN and OT informed after session. Will continue to follow.     Follow Up Recommendations  SNF;Supervision/Assistance - 24 hour     Equipment Recommendations  Wheelchair cushion (measurements PT);Wheelchair (measurements PT);Hospital bed    Recommendations for Other Services       Precautions / Restrictions Precautions Precautions: Fall Precaution Comments: nephrostomy drain, at risk for skin breakdown, PEG Restrictions Weight Bearing Restrictions: No    Mobility  Bed Mobility                    Transfers                    Ambulation/Gait                 Stairs             Wheelchair Mobility    Modified Rankin (Stroke Patients Only) Modified Rankin (Stroke Patients Only) Pre-Morbid Rankin Score: No significant disability Modified Rankin: Severe disability     Balance                                            Cognition Arousal/Alertness: Lethargic Behavior During Therapy: Flat affect Overall Cognitive Status: Impaired/Different from baseline Area of  Impairment: Attention;Following commands;Awareness;Problem solving                 Orientation Level: Disoriented to;Time;Situation;Person;Place Current Attention Level: Focused Memory: Decreased short-term memory;Decreased recall of precautions Following Commands: Follows one step commands inconsistently;Follows one step commands with increased time Safety/Judgement: Decreased awareness of safety;Decreased awareness of deficits Awareness: Intellectual Problem Solving: Slow processing;Decreased initiation;Requires verbal cues;Requires tactile cues;Difficulty sequencing        Exercises General Exercises - Upper Extremity Shoulder Flexion: PROM;10 reps;Supine;Right;Left Elbow Flexion: PROM;Left;Right;10 reps;Supine Elbow Extension: PROM;Right;Left;10 reps;Supine General Exercises - Lower Extremity Ankle Circles/Pumps: PROM;10 reps;Right;Left Heel Slides: PROM;10 reps;Right;Left Other Exercises Other Exercises: Cervical rotation to the L stretch Other Exercises: retrograde massage for swelling on dorsal aspect of L hand where strap of palm protector was fastened.    General Comments        Pertinent Vitals/Pain Pain Assessment: Faces Faces Pain Scale: Hurts little more Pain Location: PROM of neck, B UE's Pain Descriptors / Indicators: Grimacing Pain Intervention(s): Limited activity within patient's tolerance;Monitored during session;Repositioned    Home Living                      Prior Function            PT  Goals (current goals can now be found in the care plan section) Acute Rehab PT Goals Patient Stated Goal: Unable to state. PT Goal Formulation: Patient unable to participate in goal setting Time For Goal Achievement: 05/01/20 Potential to Achieve Goals: Fair Progress towards PT goals: Progressing toward goals    Frequency    Min 3X/week      PT Plan Current plan remains appropriate    Co-evaluation              AM-PAC PT "6  Clicks" Mobility   Outcome Measure  Help needed turning from your back to your side while in a flat bed without using bedrails?: Total Help needed moving from lying on your back to sitting on the side of a flat bed without using bedrails?: Total Help needed moving to and from a bed to a chair (including a wheelchair)?: Total Help needed standing up from a chair using your arms (e.g., wheelchair or bedside chair)?: Total Help needed to walk in hospital room?: Total Help needed climbing 3-5 steps with a railing? : Total 6 Click Score: 6    End of Session Equipment Utilized During Treatment: Oxygen Activity Tolerance: Patient limited by fatigue (weakness) Patient left: with call bell/phone within reach;in bed Nurse Communication: Mobility status;Need for lift equipment;Other (comment) (Swelling around strap of palm protector) PT Visit Diagnosis: Other symptoms and signs involving the nervous system (R29.898);Difficulty in walking, not elsewhere classified (R26.2);Unsteadiness on feet (R26.81);Muscle weakness (generalized) (M62.81)     Time: 8295-6213 PT Time Calculation (min) (ACUTE ONLY): 22 min  Charges:  $Therapeutic Activity: 8-22 mins                     Hannah Fischer, PT, DPT Acute Rehabilitation Services Pager: [phone] Office: [phone]    Hannah Fischer 04/19/2020, 1:57 PM

## 2020-04-19 NOTE — TOC Progression Note (Signed)
Transition of Care [facility]) - Progression Note    Patient Details  Name: Hannah Fischer MRN: [id number] Date of Birth: May 29, 1932  Transition of Care [facility]) CM/SW Contact  Janae Bridgeman, RN Phone Number: 04/19/2020, 11:09 AM  Clinical Narrative:    Case management called and left a message with Bjorn Pippin, CM with [facility] to continue insurance authorization for SNF placement at [facility].  Meryle Ready, CM at [facility] is aware that patient will be medically stable to transfer to the SNF next week per Junious Silk, NP.  CM and MSW with DTP Team will continue to follow the patient for SNF placement.   Expected Discharge Plan: Skilled Nursing Facility Barriers to Discharge: Continued Medical Work up (Patient scheduled for PEG placement on 04/17/2020)  Expected Discharge Plan and Services Expected Discharge Plan: Skilled Nursing Facility In-house Referral: Clinical Social Work,Hospice / Palliative Care Discharge Planning Services: CM Consult Post Acute Care Choice: Skilled Nursing Facility Living arrangements for the past 2 months: Skilled Nursing Facility                                       Social Determinants of Health (SDOH) Interventions    Readmission Risk Interventions Readmission Risk Prevention Plan 04/13/2020  Transportation Screening Complete  Medication Review [facility]) Complete  PCP or Specialist appointment within 3-5 days of discharge Complete  HRI or Home Care Consult Complete  SW Recovery Care/Counseling Consult Complete  Palliative Care Screening Complete  Skilled Nursing Facility Complete  Some recent data might be hidden

## 2020-04-19 NOTE — Progress Notes (Addendum)
PHARMACY - PHYSICIAN COMMUNICATION CRITICAL VALUE ALERT - BLOOD CULTURE IDENTIFICATION (BCID)  Hannah Fischer is an 87 y.o. female who presented to [facility] on 03/18/2020 with a chief complaint of acute AMS.   Assessment: Blood cultures growing GPCs in 1/2 bottles. BCID reports Staph epi, mecA/C resistance detected. Likely a contaminate. Other blood cultures still pending. Remains afebrile, WBC wnl.   Name of physician (or Provider) Contacted: Ramiro Harvest, MD  Current antibiotics: None   Changes to prescribed antibiotics recommended:  No antibiotics needed at this time.   Results for orders placed or performed during the hospital encounter of 03/18/20  Blood Culture ID Panel (Reflexed) (Collected: 04/17/2020  4:48 PM)  Result Value Ref Range   Enterococcus faecalis NOT DETECTED NOT DETECTED   Enterococcus Faecium NOT DETECTED NOT DETECTED   Listeria monocytogenes NOT DETECTED NOT DETECTED   Staphylococcus species DETECTED (A) NOT DETECTED   Staphylococcus aureus (BCID) NOT DETECTED NOT DETECTED   Staphylococcus epidermidis DETECTED (A) NOT DETECTED   Staphylococcus lugdunensis NOT DETECTED NOT DETECTED   Streptococcus species NOT DETECTED NOT DETECTED   Streptococcus agalactiae NOT DETECTED NOT DETECTED   Streptococcus pneumoniae NOT DETECTED NOT DETECTED   Streptococcus pyogenes NOT DETECTED NOT DETECTED   A.calcoaceticus-baumannii NOT DETECTED NOT DETECTED   Bacteroides fragilis NOT DETECTED NOT DETECTED   Enterobacterales NOT DETECTED NOT DETECTED   Enterobacter cloacae complex NOT DETECTED NOT DETECTED   Escherichia coli NOT DETECTED NOT DETECTED   Klebsiella aerogenes NOT DETECTED NOT DETECTED   Klebsiella oxytoca NOT DETECTED NOT DETECTED   Klebsiella pneumoniae NOT DETECTED NOT DETECTED   Proteus species NOT DETECTED NOT DETECTED   Salmonella species NOT DETECTED NOT DETECTED   Serratia marcescens NOT DETECTED NOT DETECTED   Haemophilus influenzae NOT DETECTED NOT  DETECTED   Neisseria meningitidis NOT DETECTED NOT DETECTED   Pseudomonas aeruginosa NOT DETECTED NOT DETECTED   Stenotrophomonas maltophilia NOT DETECTED NOT DETECTED   Candida albicans NOT DETECTED NOT DETECTED   Candida auris NOT DETECTED NOT DETECTED   Candida glabrata NOT DETECTED NOT DETECTED   Candida krusei NOT DETECTED NOT DETECTED   Candida parapsilosis NOT DETECTED NOT DETECTED   Candida tropicalis NOT DETECTED NOT DETECTED   Cryptococcus neoformans/gattii NOT DETECTED NOT DETECTED   Methicillin resistance mecA/C DETECTED (A) NOT DETECTED    Trixie Rude, PharmD PGY1 Acute Care Pharmacy Resident 04/19/2020 8:39 AM  Please check [email or web address] for unit-specific pharmacy phone numbers.

## 2020-04-20 DIAGNOSIS — N390 Urinary tract infection, site not specified: Secondary | ICD-10-CM | POA: Clinically undetermined

## 2020-04-20 DIAGNOSIS — I5032 Chronic diastolic (congestive) heart failure: Secondary | ICD-10-CM | POA: Diagnosis not present

## 2020-04-20 DIAGNOSIS — N179 Acute kidney failure, unspecified: Secondary | ICD-10-CM | POA: Diagnosis not present

## 2020-04-20 DIAGNOSIS — G9340 Encephalopathy, unspecified: Secondary | ICD-10-CM | POA: Diagnosis not present

## 2020-04-20 DIAGNOSIS — I639 Cerebral infarction, unspecified: Secondary | ICD-10-CM | POA: Diagnosis not present

## 2020-04-20 LAB — GLUCOSE, CAPILLARY
Glucose-Capillary: 129 mg/dL — ABNORMAL HIGH (ref 70–99)
Glucose-Capillary: 148 mg/dL — ABNORMAL HIGH (ref 70–99)
Glucose-Capillary: 165 mg/dL — ABNORMAL HIGH (ref 70–99)
Glucose-Capillary: 172 mg/dL — ABNORMAL HIGH (ref 70–99)
Glucose-Capillary: 181 mg/dL — ABNORMAL HIGH (ref 70–99)
Glucose-Capillary: 198 mg/dL — ABNORMAL HIGH (ref 70–99)

## 2020-04-20 LAB — COMPREHENSIVE METABOLIC PANEL
ALT: 45 U/L — ABNORMAL HIGH (ref 0–44)
AST: 34 U/L (ref 15–41)
Albumin: 2.3 g/dL — ABNORMAL LOW (ref 3.5–5.0)
Alkaline Phosphatase: 277 U/L — ABNORMAL HIGH (ref 38–126)
Anion gap: 10 (ref 5–15)
BUN: 40 mg/dL — ABNORMAL HIGH (ref 8–23)
CO2: 29 mmol/L (ref 22–32)
Calcium: 9.7 mg/dL (ref 8.9–10.3)
Chloride: 98 mmol/L (ref 98–111)
Creatinine, Ser: 1 mg/dL (ref 0.44–1.00)
GFR, Estimated: 55 mL/min — ABNORMAL LOW (ref >60)
Glucose, Bld: 176 mg/dL — ABNORMAL HIGH (ref 70–99)
Potassium: 4.5 mmol/L (ref 3.5–5.1)
Sodium: 137 mmol/L (ref 135–145)
Total Bilirubin: 0.6 mg/dL (ref 0.3–1.2)
Total Protein: 6.2 g/dL — ABNORMAL LOW (ref 6.5–8.1)

## 2020-04-20 LAB — CBC
HCT: 34.5 % — ABNORMAL LOW (ref 36.0–46.0)
Hemoglobin: 11.1 g/dL — ABNORMAL LOW (ref 12.0–15.0)
MCH: 31.7 pg (ref 26.0–34.0)
MCHC: 32.2 g/dL (ref 30.0–36.0)
MCV: 98.6 fL (ref 80.0–100.0)
Platelets: 286 10*3/uL (ref 150–400)
RBC: 3.5 MIL/uL — ABNORMAL LOW (ref 3.87–5.11)
RDW: 13.4 % (ref 11.5–15.5)
WBC: 5.4 10*3/uL (ref 4.0–10.5)
nRBC: 0 % (ref 0.0–0.2)

## 2020-04-20 LAB — URINE CULTURE: Culture: 80000 — AB

## 2020-04-20 MED ORDER — OXYCODONE HCL 5 MG PO TABS
2.5000 mg | ORAL_TABLET | Freq: Three times a day (TID) | ORAL | Status: DC
  Administered 2020-04-20 – 2020-05-01 (×33): 2.5 mg
  Filled 2020-04-20 (×33): qty 1

## 2020-04-20 MED ORDER — SODIUM CHLORIDE 0.9 % IV SOLN
100.0000 mg | Freq: Two times a day (BID) | INTRAVENOUS | Status: AC
  Administered 2020-04-20 – 2020-04-24 (×10): 100 mg via INTRAVENOUS
  Filled 2020-04-20 (×11): qty 100

## 2020-04-20 NOTE — Progress Notes (Signed)
Nutrition Follow-up  DOCUMENTATION CODES:   Not applicable  INTERVENTION:   No bowel movement documented since 3/12, consider initiation of bowel regimen.   Continue TF via PEG:  Glucerna 1.2 at 45 ml/h (1076m)  Prosource TF 45 ml TID  Free water flushes 200 ml every 6 hours   Provides 1416 kcal, 93 gm protein, 1669 ml total free water daily   NUTRITION DIAGNOSIS:   Inadequate oral intake related to inability to eat as evidenced by NPO status.  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  2/14 Cortrak placed (gastric) 2/19 ultrasound revealed hydronephrosis, R worse that L and chronic R UPJ obstruction. 2/21 R percutaneous nephrostomy drain placement in IR. 3/1 TF held due to [place] dislodged 3/2 Cortrak repositioned; gastric tip 3/14 s/p PEG placement  Pt remains very lethargic and minimally responsive. Pt receiving TF via PEG. Current regimen: Glucerna 1.2 at 45 ml/hr with Prosource TF 45 ml TID and 2035mfree water flushes Q6H. Tolerating well per RN.   No BM noted since 3/12. Consider initiation of bowel regimen.   UOP: [id number] x24 hours  Admit wt: 78.9 kg Current wt: 80 kg (not updated since 3/5)  Medications: ss novolog Q4H, 2 units novolog Q4H, 5 units levemir daily Labs reviewed.  CBGs [id number] Coordinator following)  Diet Order:   Diet Order            Diet NPO time specified Except for: Ice Chips  Diet effective now                 EDUCATION NEEDS:   No education needs have been identified at this  time  Skin:  Skin Assessment: Reviewed RN Assessment  Last BM:  3/12 type 7  Height:   Ht Readings from Last 1 Encounters:  03/18/20 5' 3"  (1.6 m)    Weight:   Wt Readings from Last 1 Encounters:  04/08/20 80 kg    Ideal Body Weight:  52.2 kg  BMI:  Body mass index is 31.24 kg/m.  Estimated Nutritional Needs:   Kcal:  1400-1600  Protein:  85-100 grams  Fluid:  > 1.5 L/day   AmaLarkin InaS, RD, LDN RD pager number and weekend/on-call pager number located in [place]

## 2020-04-20 NOTE — Progress Notes (Signed)
PROGRESS NOTE    Hannah Fischer  ZOX:[id number] DOB: 05/15/1932 DOA: 03/18/2020 PCP: Sigmund Hazel, MD   Chief Complaint  Patient presents with  . 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 Shehad gone to bedin the morning her husbandhad trouble waking her up. In the ED she was noted to be responsive to pain. A code stroke was called. She was outside the window for TPA. An emergent CT did not show any acute abnormality. A CTA was performed and did not reveal any obvious stenosis. An MRI of the brain was performed on 2/13:Punctate acute infarction in the left superior cerebellumadjacent to the superior cerebellar peduncle. Bilateral thalamicacute infarction extending back into the mid brain. This is consistent with artery of Percheron infarction.She continues to wax and wane from a mental status standpoint. She had cortrak and subsequently underwent PEG tube placement.. Palliative and neuro are following,   Assessment & Plan:   Principal Problem:   Acute encephalopathy Active Problems:   Diabetes mellitus (HCC)   Hypothyroidism   History of embolic stroke   Acute bilateral thalamic infarction (HCC)   Dysphagia   CKD (chronic kidney disease), symptom management only, stage 3 (moderate) (HCC)   Hydronephrosis   Group B streptococcal UTI   Uncontrolled type 2 diabetes mellitus with hyperglycemia, with long-term current use of insulin (HCC)   Chronic diastolic heart failure (HCC)   Essential hypertension   Physical debility   Left hemiplegia (HCC)   Palliative care by specialist   Goals of care, counseling/discussion   DNR (do not resuscitate)   Fever   Acute lower UTI  1 bilateral thalamic infarct/persistent acute metabolic encephalopathy Secondary to acute CVA. -Patient  noted to have some waxing and waning mental status and at times minimally responsive versus others where she is noted to talk. -Patient initially noted to be on Provigil which was uptitrated however patient developed tremulousness and subsequently discontinued. -Patient also underwent trial of amantadine which was discontinued. -Patient with prior history of embolic infarcts imaging on admission showed punctate acute infarct in the left superior cerebellum adjacent to superior cerebellar peduncle.  Bilateral thalamic acute infarct extending back into the midbrain consistent with artery of Percheron infarction. -Patient noted to have an acute event with asymmetric pupils 2/25 underwent repeat MRI with no acute findings however expected evolution of known infarcts. -EEG done 03/19/2020 with moderate diffuse encephalopathy with no apparent underlying seizure activity. -Continue Plavix for secondary stroke prophylaxis. -Statin on hold secondary to transaminitis.  LFTs trending down.  Follow.  2.  Hypertonicity and spasticity secondary to recent brain injury from stroke -Patient noted to have significant hypertonicity lower extremities affecting working with PT and OT efficiently. -Patient also noted to have spasticity related tremors in upper extremities -Skelaxin and Robaxin held secondary to transaminitis.  LFTs trending down. -Could possibly resume Robaxin. -Decrease current regimen of low-dose OxyIR 2.5 mg to every 8 hours and continue to wean.  -Dr. Riley Kill, consulted via secure chat per Junious Silk, NP to assist with initiating/titrating medications for spasticity and hypertonicity.  3.  Recurrent urinary retention Patient noted to have required I/O cath. -Foley catheter inserted 04/17/2020 with 900 cc of urine returning. -Concerned that urinary retention may be secondary to medications which have been discontinued. -Urine output of 1.380 L over the past 24 hours.   -Nephrostomy with output  of  530 cc over the past 24 hours and 850 cc out of Foley.   -Voiding trial in about 4 days.  4.  Group B strep UTI/recurrent fevers/MRSA UTI (3/15) -Patient noted to have GBS UTI from 212 status post treatment with IV Rocephin. -2/27 noted to have yeast UTI status post course of fluconazole -Patient noted to have fever 2/27, chest x-ray with bibasilar opacities, urine culture with yeast status post completion of fluconazole. -Blood cultures 1/2 with staph epidermis likely contamination. -Patient noted to have low-grade temp 100.2 on 3/5 and temp of 101.1 3/14 and subsequently pancultured. -Urinalysis abnormal, chest x-ray unremarkable with improvement of bibasilar atelectasis. -Fever curve trending down. -Urine cultures with 80,000 colonies of MRSA.   -Due to ongoing fevers will place patient on IV doxycycline and treat for 3 to 5 days.  5.  Dysphagia/nutrition -Patient noted to have inadequate oral intake. -Patient had coretrack now status post PEG tube placement 04/17/2020.   -Tube feeds started.   -Dietitian consulted and following.   6.  AKI on chronic kidney disease stage IIIA/right-sided hydronephrosis -Baseline creatinine 1-1.3. -Early on during the hospitalization patient noted to have worsening renal function renal ultrasound done consistent with a severe right-sided hydronephrosis. -Neurology/urology consulted patient status post right nephrostomy tube placement per IR 03/27/2020 -Due to urinary retention Foley catheter was placed. -Abdominal ultrasound on 04/18/2020 with resolution of previously noted right hydronephrosis on CT. -Creatinine improving and currently around baseline of 1.00. -Outpatient follow-up with urology.  7.  Diabetes mellitus type 2 Hemoglobin A1c 7.6.   -CBG 181 this morning.   -Continue Levemir 5 units daily.  Sliding scale insulin.   8.  Chronic diastolic heart failure/hypertension 2D echo with grade 2 diastolic dysfunction. -Euvolemic.   -Continue  Plavix, Norvasc.    9.  Residual left hemiplegia from prior stroke/debility/deconditioning -PT/OT. -Likely needs SNF.  10.  Gluteal skin breakdown -Barrier cream, Desitin -Air mattress -Frequent turns.  11.  Hypothyroidism -Continue Synthroid.  Outpatient follow-u  12.  Hyperkalemia Resolved.  Potassium of 4.5.        DVT prophylaxis: Heparin Code Status: DNR Family Communication: Updated husband at bedside. Disposition:   Status is: Inpatient    Dispo: The patient is from: Home              Anticipated d/c is to: SNF              Patient currently Patient very slow to progress with PT and OT and unclear if she is an appropriate rehab candidate for SNF or will need long-term placement.  Had PEG tube placed on 3/8.  Family has confirmed that they do not have the resources to pay for skilled nursing facility services out-of-pocket once insurance benefits exhausted.   Difficult to place patient yes       Consultants:   Interventional radiology: Dr. Grace Isaac 04/11/2020  Urology: Dr. Marlou Porch 03/25/2020  Nephrology: Dr. Melanee Spry 03/25/2020  Palliative care: Lamarr Lulas 2, NP 03/21/2020  Neurology: Dr. Pearlean Brownie 03/18/2020  Procedures:   Right percutaneous nephrostomy 03/28/2019  CT angiogram head and neck 03/18/2020  MRI brain 03/19/2020, 03/31/2020  Gastrostomy tube placement under fluoroscopy per Dr. Deanne Coffer, interventional radiology 04/17/2020  Abdominal ultrasound 04/18/2020  Renal ultrasound 03/25/2020, 03/27/2020  CT chest abdomen and pelvis 03/25/2020  CT head 03/18/2020  MR venogram head with and without contrast 03/21/2020  Antimicrobials:  Anti-infectives (From admission, onward)   Start     Dose/Rate Route Frequency Ordered Stop   04/20/20  1000  doxycycline (VIBRAMYCIN) 100 mg in sodium chloride 0.9 % 250 mL IVPB        100 mg 125 mL/hr over 120 Minutes Intravenous Every 12 hours 04/20/20 0831 04/25/20 0959   04/17/20 0807  ceFAZolin (ANCEF) 2-4  GM/100ML-% IVPB       Note to Pharmacy: Cristy Friedlander   : cabinet override      04/17/20 0807 04/17/20 0847   04/07/20 2000  fluconazole (DIFLUCAN) 10 MG/ML suspension 100 mg        100 mg Per Tube Every evening 04/07/20 1236 04/08/20 2023   04/03/20 2000  fluconazole (DIFLUCAN) 40 MG/ML suspension 100 mg  Status:  Discontinued       "Followed by" Linked Group Details   100 mg Per Tube Every 24 hours 04/02/20 1752 04/07/20 1236   04/02/20 1900  fluconazole (DIFLUCAN) 40 MG/ML suspension 200 mg       "Followed by" Linked Group Details   200 mg Per Tube  Once 04/02/20 1752 04/02/20 2020   03/26/20 0730  cefTRIAXone (ROCEPHIN) 1 g in sodium chloride 0.9 % 100 mL IVPB        1 g 200 mL/hr over 30 Minutes Intravenous Every 24 hours 03/26/20 0644 03/30/20 0844   03/18/20 1900  azithromycin (ZITHROMAX) 500 mg in sodium chloride 0.9 % 250 mL IVPB  Status:  Discontinued        500 mg 250 mL/hr over 60 Minutes Intravenous Every 24 hours 03/18/20 1830 03/19/20 1106   03/18/20 1900  cefTRIAXone (ROCEPHIN) 2 g in sodium chloride 0.9 % 100 mL IVPB  Status:  Discontinued        2 g 200 mL/hr over 30 Minutes Intravenous Every 24 hours 03/18/20 1830 03/19/20 1106       Subjective: Sleeping deeply.  Husband at bedside.  Husband states mental status has been waxing and waning.   Objective: Vitals:   04/20/20 0342 04/20/20 0753 04/20/20 1124 04/20/20 1500  BP: (!) 141/78 115/66 (!) 153/85 132/75  Pulse: 88 80 90 77  Resp: 18 18 20 18   Temp: 99.6 F (37.6 C) 98.2 F (36.8 C) 99.4 F (37.4 C)   TempSrc: Oral Axillary Axillary Axillary  SpO2: 94% 96% 97% 95%  Weight:      Height:        Intake/Output Summary (Last 24 hours) at 04/20/2020 1714 Last data filed at 04/20/2020 0629 Gross per 24 hour  Intake 1066.75 ml  Output 1380 ml  Net -313.25 ml   Filed Weights   04/04/20 0438 04/05/20 0500 04/08/20 0423  Weight: 81.8 kg 79.5 kg 80 kg    Examination:  General exam: Sleeping  heavily. Respiratory system: CTA B anterior lung fields.  No wheezes, no crackles, no rhonchi.  Normal respiratory effort.   Cardiovascular system: Regular rate rhythm no murmurs rubs or gallops.  No JVD.  No lower extremity edema.  Gastrointestinal system: Abdomen is soft, nontender, nondistended, positive bowel sounds.  No rebound.  No guarding.  Central nervous system: Sleeping heavily.  Bilateral upper extremities in splints.  Spasticity right upper extremity.  Hypertonicity bilateral lower extremities but with pain. Extremities: Symmetric 5 x 5 power. Skin: No rashes, lesions or ulcers Psychiatry: Judgement and insight unable to assess. Mood & affect appropriate.     Data Reviewed: I have personally reviewed following labs and imaging studies  CBC: Recent Labs  Lab 04/15/20 0258 04/16/20 0335 04/17/20 0202 04/19/20 0421 04/20/20 0417  WBC 6.3 6.7 6.2 5.3  5.4  NEUTROABS 3.7 4.5 4.1 3.4  --   HGB 11.4* 11.4* 11.0* 10.3* 11.1*  HCT 34.6* 33.7* 35.1* 31.9* 34.5*  MCV 98.3 97.1 99.4 99.7 98.6  PLT 337 356 293 250 286    Basic Metabolic Panel: Recent Labs  Lab 04/16/20 0335 04/17/20 0202 04/18/20 0234 04/19/20 0421 04/20/20 0417  NA 136 135 136 138 137  K 5.0 4.9 4.8 4.0 4.5  CL 98 98 99 102 98  CO2 29 26 29 29 29   GLUCOSE 86 125* 169* 179* 176*  BUN 63* 60* 52* 44* 40*  CREATININE 1.26* 1.25* 1.26* 1.08* 1.00  CALCIUM 9.8 9.8 9.8 9.6 9.7  MG 2.3 2.3  --   --   --   PHOS 4.3 4.5  --   --   --     GFR: Estimated Creatinine Clearance: 41.2 mL/min (by C-G formula based on SCr of 1 mg/dL).  Liver Function Tests: Recent Labs  Lab 04/16/20 0335 04/17/20 0202 04/18/20 0234 04/19/20 0421 04/20/20 0417  AST 164* 63* 144* 65* 34  ALT 89* 64* 67* 67* 45*  ALKPHOS 329* 268* 326* 287* 277*  BILITOT 0.9 0.8 2.1* 0.7 0.6  PROT 6.4* 6.7 6.0* 5.8* 6.2*  ALBUMIN 2.7* 2.7* 2.4* 2.2* 2.3*    CBG: Recent Labs  Lab 04/19/20 2340 04/20/20 0343 04/20/20 0747  04/20/20 1147 04/20/20 1537  GLUCAP 152* 165* 181* 172* 198*     Recent Results (from the past 240 hour(s))  Surgical pcr screen     Status: Abnormal   Collection Time: 04/16/20  7:28 PM   Specimen: Nasal Mucosa; Nasal Swab  Result Value Ref Range Status   MRSA, PCR NEGATIVE NEGATIVE Final   Staphylococcus aureus POSITIVE (A) NEGATIVE Final    Comment: (NOTE) The Xpert SA Assay (FDA approved for NASAL specimens in patients 19 years of age and older), is one component of a comprehensive surveillance program. It is not intended to diagnose infection nor to guide or monitor treatment. Performed at [facility], [address], [place], [place]   Culture, blood (routine x 2)     Status: None (Preliminary result)   Collection Time: 04/17/20  4:48 PM   Specimen: BLOOD  Result Value Ref Range Status   Specimen Description BLOOD LEFT ANTECUBITAL  Final   Special Requests   Final    BOTTLES DRAWN AEROBIC ONLY Blood Culture adequate volume   Culture   Final    NO GROWTH 3 DAYS Performed at [facility], [address] [place], [place]    Report Status PENDING  Incomplete  Culture, blood (routine x 2)     Status: Abnormal (Preliminary result)   Collection Time: 04/17/20  4:48 PM   Specimen: BLOOD RIGHT HAND  Result Value Ref Range Status   Specimen Description BLOOD RIGHT HAND  Final   Special Requests   Final    BOTTLES DRAWN AEROBIC ONLY Blood Culture adequate volume   Culture  Setup Time   Final    AEROBIC BOTTLE ONLY GRAM POSITIVE COCCI CRITICAL RESULT CALLED TO, READ BACK BY AND VERIFIED WITH: PHARMD THOMAS J.  04/19/20 [phone] FCP    Culture (A)  Final    STAPHYLOCOCCUS EPIDERMIDIS THE SIGNIFICANCE OF ISOLATING THIS ORGANISM FROM A SINGLE SET OF BLOOD CULTURES WHEN MULTIPLE SETS ARE DRAWN IS UNCERTAIN. PLEASE NOTIFY THE MICROBIOLOGY DEPARTMENT WITHIN ONE WEEK IF SPECIATION AND SENSITIVITIES ARE REQUIRED. Performed at [facility], [address] [place], [place] [address]    Report Status PENDING  Incomplete  Blood Culture ID Panel (Reflexed)     Status: Abnormal   Collection Time: 04/17/20  4:48 PM  Result Value Ref Range Status   Enterococcus faecalis NOT DETECTED NOT DETECTED Final   Enterococcus Faecium NOT DETECTED NOT DETECTED Final   Listeria monocytogenes NOT DETECTED NOT DETECTED Final   Staphylococcus species DETECTED (A) NOT DETECTED Final    Comment: CRITICAL RESULT CALLED TO, READ BACK BY AND VERIFIED WITH: PHARMD THOMAS J.  [phone] FCP    Staphylococcus aureus (BCID) NOT DETECTED NOT DETECTED Final   Staphylococcus epidermidis DETECTED (A) NOT DETECTED Final    Comment: Methicillin (oxacillin) resistant coagulase negative staphylococcus. Possible blood culture contaminant (unless isolated from more than one blood culture draw or clinical case suggests pathogenicity). No antibiotic treatment is indicated for blood  culture contaminants. CRITICAL RESULT CALLED TO, READ BACK BY AND VERIFIED WITH: PHARMD THOMAS J.  [id number] [phone] FCP    Staphylococcus lugdunensis NOT DETECTED NOT DETECTED Final   Streptococcus species NOT DETECTED NOT DETECTED Final   Streptococcus agalactiae NOT DETECTED NOT DETECTED Final   Streptococcus pneumoniae NOT DETECTED NOT DETECTED Final   Streptococcus pyogenes NOT DETECTED NOT DETECTED Final   A.calcoaceticus-baumannii NOT DETECTED NOT DETECTED Final   Bacteroides fragilis NOT DETECTED NOT DETECTED Final   Enterobacterales NOT DETECTED NOT DETECTED Final   Enterobacter cloacae complex NOT DETECTED NOT DETECTED Final   Escherichia coli NOT DETECTED NOT DETECTED Final   Klebsiella aerogenes NOT DETECTED NOT DETECTED Final   Klebsiella oxytoca NOT DETECTED NOT DETECTED Final   Klebsiella pneumoniae NOT DETECTED NOT DETECTED Final   Proteus species NOT DETECTED NOT DETECTED Final   Salmonella species NOT DETECTED NOT DETECTED Final   Serratia marcescens NOT DETECTED NOT DETECTED  Final   Haemophilus influenzae NOT DETECTED NOT DETECTED Final   Neisseria meningitidis NOT DETECTED NOT DETECTED Final   Pseudomonas aeruginosa NOT DETECTED NOT DETECTED Final   Stenotrophomonas maltophilia NOT DETECTED NOT DETECTED Final   Candida albicans NOT DETECTED NOT DETECTED Final   Candida auris NOT DETECTED NOT DETECTED Final   Candida glabrata NOT DETECTED NOT DETECTED Final   Candida krusei NOT DETECTED NOT DETECTED Final   Candida parapsilosis NOT DETECTED NOT DETECTED Final   Candida tropicalis NOT DETECTED NOT DETECTED Final   Cryptococcus neoformans/gattii NOT DETECTED NOT DETECTED Final   Methicillin resistance mecA/C DETECTED (A) NOT DETECTED Final    Comment: CRITICAL RESULT CALLED TO, READ BACK BY AND VERIFIED WITH: Marissa Nestle  [id number] [phone] FCP Performed at [facility], [address], [place], [place] [address]   Culture, Urine     Status: Abnormal   Collection Time: 04/18/20  3:57 AM   Specimen: Urine, Catheterized  Result Value Ref Range Status   Specimen Description URINE, CATHETERIZED  Final   Special Requests   Final    NONE Performed at [facility], [address], [place], [place] [address]    Culture (A)  Final    80,000 COLONIES/mL METHICILLIN RESISTANT STAPHYLOCOCCUS AUREUS   Report Status 04/20/2020 FINAL  Final   Organism ID, Bacteria METHICILLIN RESISTANT STAPHYLOCOCCUS AUREUS (A)  Final      Susceptibility   Methicillin resistant staphylococcus aureus - MIC*    CIPROFLOXACIN >=8 RESISTANT Resistant     GENTAMICIN <=0.5 SENSITIVE Sensitive     NITROFURANTOIN <=16 SENSITIVE Sensitive     OXACILLIN >=4  RESISTANT Resistant     TETRACYCLINE <=1 SENSITIVE Sensitive     VANCOMYCIN <=0.5 SENSITIVE Sensitive     TRIMETH/SULFA <=10 SENSITIVE Sensitive     CLINDAMYCIN <=0.25 SENSITIVE Sensitive     RIFAMPIN <=0.5 SENSITIVE Sensitive     Inducible Clindamycin NEGATIVE Sensitive     * 80,000 COLONIES/mL METHICILLIN RESISTANT  STAPHYLOCOCCUS AUREUS         Radiology Studies: US Abdomen Complete  Result Date: 04/18/2020 CLINICAL DATA:  Abnormal labs EXAM: ABDOMEN ULTRASOUND COMPLETE COMPARISON:  CT 03/25/2020 FINDINGS: Gallbladder: Surgically absent Common bile duct: Diameter: 8 mm Liver: Echogenicity within normal limits. No focal hepatic abnormality. Portal vein is patent on color Doppler imaging with normal direction of blood flow towards the liver. IVC: No abnormality visualized. Pancreas: Pancreatic duct is mildly dilated. Spleen: Size and appearance within normal limits. Right Kidney: Length: 8.9 cm. Echogenicity within normal limits. No mass or hydronephrosis visualized. Left Kidney: Length: 8.5 cm. Echogenicity within normal limits. No mass or hydronephrosis visualized. Abdominal aorta: No aneurysm visualized. Other findings: None. IMPRESSION: 1. Status post cholecystectomy. Common duct diameter within normal limits for post cholecystectomy patient. 2. Slight dilatation of pancreatic duct. 3. Previously noted right hydronephrosis on CT has resolved in this patient with history of percutaneous nephrostomy. Electronically Signed   By: Jasmine PangKim  Fujinaga M.D.   On: 04/18/2020 20:35        Scheduled Meds: . amLODipine  5 mg Per Tube Daily  . chlorhexidine  15 mL Mouth Rinse BID  . Chlorhexidine Gluconate Cloth  6 each Topical Daily  . clopidogrel  75 mg Per Tube Daily  . feeding supplement (PROSource TF)  45 mL Per Tube TID  . free water  200 mL Per Tube Q6H  . heparin  5,000 Units Subcutaneous Q8H  . insulin aspart  0-9 Units Subcutaneous Q4H  . insulin aspart  2 Units Subcutaneous Q4H  . insulin detemir  5 Units Subcutaneous Daily  . levothyroxine  112 mcg Per Tube QAC breakfast  . liver oil-zinc oxide   Topical BID  . mouth rinse  15 mL Mouth Rinse q12n4p  . modafinil  200 mg Per Tube Daily  . mupirocin ointment  1 application Nasal BID  . oxyCODONE  2.5 mg Per Tube Q6H  . sodium chloride flush  5 mL  Intracatheter Q8H   Continuous Infusions: . sodium chloride 10 mL/hr at 03/24/20 0305  . doxycycline (VIBRAMYCIN) IV 100 mg (04/20/20 1157)  . feeding supplement (GLUCERNA 1.2 CAL) 45 mL/hr at 04/19/20 1820     LOS: 33 days    Time spent: 40 minutes    Ramiro Harvestaniel Thompson, MD [facility]   To contact the attending provider between 7A-7P or the covering provider during after hours 7P-7A, please log into the web site [email or web address] and access using universal [place] password for that web site. If you do not have the password, please call the hospital operator.  04/20/2020, 5:14 PM

## 2020-04-20 NOTE — Progress Notes (Signed)
   04/20/20 1124  Assess: MEWS Score  Temp 99.4 F (37.4 C)  BP (!) 153/85  Pulse Rate 90  Resp 20  SpO2 97 %  O2 Device Room Air  Assess: MEWS Score  MEWS Temp 0  MEWS Systolic 0  MEWS Pulse 0  MEWS RR 0  MEWS LOC 2  MEWS Score 2  MEWS Score Color Yellow  Assess: if the MEWS score is Yellow or Red  Were vital signs taken at a resting state? Yes  Focused Assessment No change from prior assessment  Early Detection of Sepsis Score *See Row Information* Low  MEWS guidelines implemented *See Row Information* No, altered LOC is baseline  Treat  Pain Scale Faces  Pain Score 4  Pain Intervention(s) Medication (See eMAR);Repositioned  Take Vital Signs  Increase Vital Sign Frequency  Yellow: Q 2hr X 2 then Q 4hr X 2, if remains yellow, continue Q 4hrs  Escalate  MEWS: Escalate Yellow: discuss with charge nurse/RN and consider discussing with provider and RRT  Notify: Charge Nurse/RN  Name of Charge Nurse/RN Notified Arlys John  Date Charge Nurse/RN Notified 04/20/20  Time Charge Nurse/RN Notified 1126

## 2020-04-20 NOTE — Progress Notes (Signed)
   04/20/20 1500  Assess: MEWS Score  BP 132/75  Pulse Rate 77  ECG Heart Rate 67  Resp 18  SpO2 95 %  O2 Device Room Air  Assess: MEWS Score  MEWS Temp 0  MEWS Systolic 0  MEWS Pulse 0  MEWS RR 0  MEWS LOC 2  MEWS Score 2  MEWS Score Color Yellow  Assess: if the MEWS score is Yellow or Red  Were vital signs taken at a resting state? Yes  Focused Assessment No change from prior assessment  Early Detection of Sepsis Score *See Row Information* Low  MEWS guidelines implemented *See Row Information* No, vital signs rechecked  Treat  MEWS Interventions Other (Comment)  Take Vital Signs  Increase Vital Sign Frequency  Yellow: Q 2hr X 2 then Q 4hr X 2, if remains yellow, continue Q 4hrs  Escalate  MEWS: Escalate Yellow: discuss with charge nurse/RN and consider discussing with provider and RRT  Notify: Charge Nurse/RN  Name of Charge Nurse/RN Notified Arlys John  Date Charge Nurse/RN Notified 04/20/20  Time Charge Nurse/RN Notified 1500

## 2020-04-20 NOTE — Progress Notes (Signed)
   04/20/20 1330  Assess: MEWS Score  Temp 99 F (37.2 C)  BP 130/64  Pulse Rate 72  ECG Heart Rate 71  Resp 20  Level of Consciousness Responds to Pain  SpO2 94 %  O2 Device Room Air  Patient Activity (if Appropriate) In bed  Assess: MEWS Score  MEWS Temp 0  MEWS Systolic 0  MEWS Pulse 0  MEWS RR 0  MEWS LOC 2  MEWS Score 2  MEWS Score Color Yellow  Assess: if the MEWS score is Yellow or Red  Were vital signs taken at a resting state? Yes  Focused Assessment No change from prior assessment  Early Detection of Sepsis Score *See Row Information* Low  MEWS guidelines implemented *See Row Information* No, altered LOC is baseline  Treat  MEWS Interventions Administered scheduled meds/treatments  Take Vital Signs  Increase Vital Sign Frequency  Yellow: Q 2hr X 2 then Q 4hr X 2, if remains yellow, continue Q 4hrs  Escalate  MEWS: Escalate Yellow: discuss with charge nurse/RN and consider discussing with provider and RRT  Notify: Charge Nurse/RN  Name of Charge Nurse/RN Notified Arlys John  Date Charge Nurse/RN Notified 04/20/20  Time Charge Nurse/RN Notified 1330

## 2020-04-20 NOTE — Progress Notes (Signed)
°   04/20/20 1700  Assess: MEWS Score  Temp 98.9 F (37.2 C)  BP 130/82  Pulse Rate 80  ECG Heart Rate 70  Resp (!) 21  Level of Consciousness Responds to Pain  SpO2 95 %  O2 Device Room Air  Patient Activity (if Appropriate) In bed  Assess: MEWS Score  MEWS Temp 0  MEWS Systolic 0  MEWS Pulse 0  MEWS RR 1  MEWS LOC 2  MEWS Score 3  MEWS Score Color Yellow  Assess: if the MEWS score is Yellow or Red  Were vital signs taken at a resting state? Yes  Focused Assessment No change from prior assessment  Early Detection of Sepsis Score *See Row Information* Low  MEWS guidelines implemented *See Row Information* No, vital signs rechecked  Take Vital Signs  Increase Vital Sign Frequency  Yellow: Q 2hr X 2 then Q 4hr X 2, if remains yellow, continue Q 4hrs  Escalate  MEWS: Escalate Yellow: discuss with charge nurse/RN and consider discussing with provider and RRT  Notify: Charge Nurse/RN  Name of Charge Nurse/RN Notified Arlys John  Date Charge Nurse/RN Notified 04/20/20  Time Charge Nurse/RN Notified 1700

## 2020-04-20 NOTE — Progress Notes (Signed)
  Speech Language Pathology Treatment: Dysphagia  Patient Details Name: Hannah Fischer MRN: [id number] DOB: 01-08-1933 Today's Date: 04/20/2020 Time: 0945-1000 SLP Time Calculation (min) (ACUTE ONLY): 15 min  Assessment / Plan / Recommendation Clinical Impression  Followed up for PO trials. Pt s/p PEG 3/14 for nutritional support. Lethargy continues to remain barrier to diet readiness. Xerostomia noted, improving post oral care by SLP. Pt assessed with single ice chips and 1/2 teaspoon of puree. Pt with reduced lingual movement/bolus coordination including some anterior spillage and delayed AP transit. Palpable swallows exhibited x3 with POs. No overt coughing however mentation remains poor. SLP to continue to monitor.     HPI HPI: [name] is a 87 y.o. female with bilateral medial thalamic and punctate left superior cerebellar infarcts. PMHx significant for multiple CVAs with most recent acute/subacute R frontal lobe and L parietal and occipital lobe infarcts ~3 months ago, residual L-sided hemiplegia, hypothyroidism, GERD, HTN, TIA, DM, PE, anxiety, HTN, and HLD. Pt s/p PEG 3/14, remains heavily lethargic.      SLP Plan  Continue with current plan of care       Recommendations  Diet recommendations: NPO Medication Administration: Via alternative means                Plan: Continue with current plan of care       GO                Ardyth Gal MA, CCC-SLP Acute Rehabilitation Services   04/20/2020, 10:03 AM

## 2020-04-20 NOTE — Progress Notes (Addendum)
Occupational Therapy Treatment Patient Details Name: Hannah Fischer MRN: [id number] DOB: 03-Jan-1933 Today's Date: 04/20/2020    History of present illness Pt is an 87 y/o female admitted 03/18/20 with CVA. Imaging (+) for bilateral medial thalamic and punctate infarcts and L superior cerebellar infarcts. PEG placed 3/14. PMH significant for multiple CVAs with most recent ~3 months ago, residual L-sided hemiplegia with recent d/c from outpatient PT/OT with return to baseline level of function, DM, PE, HTN.   OT comments  Patient supine in bed with spouse at side, patient with eyes closed and lethargic throughout session.  Noted L UE elevated upon entry but not with palm guard on; R UE with palm guard but not elevated.  Therapist doffed splint to R UE, provided PROM to BUEs and hygiene to B hands (total assist).  Educated spouse on elevation of BUEs and splint use.  RN reports she elevated L UE this morning.  Reviewed splint schedule with RN and NT, educated to not over tighten splints and to keep UEs elevated- verbalized understanding. Updated whiteboard and will place schedule above head of bed. Will follow acutely.    Follow Up Recommendations  SNF    Equipment Recommendations  Hospital bed    Recommendations for Other Services      Precautions / Restrictions Precautions Precautions: Fall Precaution Comments: nephrostomy drain, at risk for skin breakdown, PEG Restrictions Weight Bearing Restrictions: No       Mobility Bed Mobility               General bed mobility comments: repositioned in bed to midline with total assist    Transfers                      Balance                                           ADL either performed or assessed with clinical judgement   ADL Overall ADL's : Needs assistance/impaired     Grooming: Wash/dry hands;Total assistance;Bed level Grooming Details (indicate cue type and reason): total assist to wash  hands, limited engagement due to pain in L UE with any touch                                     Vision       Perception     Praxis      Cognition Arousal/Alertness: Lethargic Behavior During Therapy: Flat affect Overall Cognitive Status: Difficult to assess                                 General Comments: pt lethargic throughout session, keeping eyes closed and only grimacing to touch/movement of L UE        Exercises Exercises: General Upper Extremity General Exercises - Upper Extremity Shoulder Flexion: PROM;10 reps;Supine;Right;Left Elbow Flexion: PROM;Left;Right;10 reps;Supine Elbow Extension: PROM;Right;Left;10 reps;Supine Wrist Flexion: PROM;10 reps;Right;Left;Supine Wrist Extension: PROM;Right;Left;10 reps;Supine Digit Composite Flexion: PROM;Both;5 reps Composite Extension: PROM;Both;10 reps   Shoulder Instructions       General Comments Provided PROM to BUEs, R palm guard on upon entry but L UE without palm guard.  Discussed splint schedule with nursing Britt Boozer RN and NT).  Assisted with hygiene to  B hands, total assist, then donning R resting hand splint and L palm guard. Educated spouse and nursing on not overtightening splints and positioning of UEs into elevation at all times.    Pertinent Vitals/ Pain       Pain Assessment: Faces Faces Pain Scale: Hurts little more Pain Location: L UE with touch, movement Pain Descriptors / Indicators: Grimacing Pain Intervention(s): Monitored during session;Repositioned  Home Living                                          Prior Functioning/Environment              Frequency  Min 1X/week        Progress Toward Goals  OT Goals(current goals can now be found in the care plan section)  Progress towards OT goals: Progressing toward goals  Acute Rehab OT Goals Patient Stated Goal: Unable to state. OT Goal Formulation: With family  Plan Discharge plan remains  appropriate;Frequency remains appropriate    Co-evaluation                 AM-PAC OT "6 Clicks" Daily Activity     Outcome Measure   Help from another person eating meals?: Total Help from another person taking care of personal grooming?: Total Help from another person toileting, which includes using toliet, bedpan, or urinal?: Total Help from another person bathing (including washing, rinsing, drying)?: Total Help from another person to put on and taking off regular upper body clothing?: Total Help from another person to put on and taking off regular lower body clothing?: Total 6 Click Score: 6    End of Session    OT Visit Diagnosis: Unsteadiness on feet (R26.81);Other abnormalities of gait and mobility (R26.89);Muscle weakness (generalized) (M62.81);Hemiplegia and hemiparesis;Other symptoms and signs involving cognitive function;Pain Hemiplegia - Right/Left: Right Hemiplegia - dominant/non-dominant: Dominant Hemiplegia - caused by: Cerebral infarction Pain - part of body:  (L UE)   Activity Tolerance Patient limited by pain;Patient limited by lethargy   Patient Left in bed;with call bell/phone within reach;with bed alarm set;with family/visitor present;with nursing/sitter in room   Nurse Communication Mobility status;Other (comment) (splint schedule)        Time: 6378-5885 OT Time Calculation (min): 21 min  Charges: OT General Charges $OT Visit: 1 Visit OT Treatments $Self Care/Home Management : 8-22 mins  Barry Brunner, OT Acute Rehabilitation Services Pager [phone] Office [phone]    Chancy Milroy 04/20/2020, 12:49 PM

## 2020-04-21 DIAGNOSIS — N179 Acute kidney failure, unspecified: Secondary | ICD-10-CM | POA: Diagnosis not present

## 2020-04-21 DIAGNOSIS — I639 Cerebral infarction, unspecified: Secondary | ICD-10-CM | POA: Diagnosis not present

## 2020-04-21 DIAGNOSIS — I5032 Chronic diastolic (congestive) heart failure: Secondary | ICD-10-CM | POA: Diagnosis not present

## 2020-04-21 DIAGNOSIS — G9340 Encephalopathy, unspecified: Secondary | ICD-10-CM | POA: Diagnosis not present

## 2020-04-21 LAB — GLUCOSE, CAPILLARY
Glucose-Capillary: 122 mg/dL — ABNORMAL HIGH (ref 70–99)
Glucose-Capillary: 122 mg/dL — ABNORMAL HIGH (ref 70–99)
Glucose-Capillary: 153 mg/dL — ABNORMAL HIGH (ref 70–99)
Glucose-Capillary: 157 mg/dL — ABNORMAL HIGH (ref 70–99)
Glucose-Capillary: 172 mg/dL — ABNORMAL HIGH (ref 70–99)
Glucose-Capillary: 189 mg/dL — ABNORMAL HIGH (ref 70–99)

## 2020-04-21 LAB — CULTURE, BLOOD (ROUTINE X 2): Special Requests: ADEQUATE

## 2020-04-21 NOTE — Progress Notes (Signed)
Palliative Medicine RN Note: Discussed pt in morning team rounds. Goals are clear, and pt is "difficult to place." At this time, PMT has no further role.  If she experiences significant decline, and her family wants to talk to [place] about changing goals and treatment, please re-consult our team.  Margret Chance. Gilma Bessette, RN, BSN, [facility] Palliative Medicine Team 04/21/2020 1:09 PM Office [phone]

## 2020-04-21 NOTE — Progress Notes (Signed)
PROGRESS NOTE    Hannah Fischer  Hannah Fischer DOB: 09-Jul-1932 DOA: 03/18/2020 PCP: Sigmund Hazel, MD   Chief Complaint  Patient presents with  . 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 Shehad gone to bedin the morning her husbandhad trouble waking her up. In the ED she was noted to be responsive to pain. A code stroke was called. She was outside the window for TPA. An emergent CT did not show any acute abnormality. A CTA was performed and did not reveal any obvious stenosis. An MRI of the brain was performed on 2/13:Punctate acute infarction in the left superior cerebellumadjacent to the superior cerebellar peduncle. Bilateral thalamicacute infarction extending back into the mid brain. This is consistent with artery of Percheron infarction.She continues to wax and wane from a mental status standpoint. She had cortrak and subsequently underwent PEG tube placement.. Palliative and neuro are following,   Assessment & Plan:   Principal Problem:   Acute encephalopathy Active Problems:   Diabetes mellitus (HCC)   Hypothyroidism   History of embolic stroke   Acute bilateral thalamic infarction (HCC)   Dysphagia   CKD (chronic kidney disease), symptom management only, stage 3 (moderate) (HCC)   Hydronephrosis   Group B streptococcal UTI   Uncontrolled type 2 diabetes mellitus with hyperglycemia, with long-term current use of insulin (HCC)   Chronic diastolic heart failure (HCC)   Essential hypertension   Physical debility   Left hemiplegia (HCC)   Palliative care by specialist   Goals of care, counseling/discussion   DNR (do not resuscitate)   Fever   Acute lower UTI  1 bilateral thalamic infarct/persistent acute metabolic encephalopathy Secondary to acute CVA. -Patient  noted to have some waxing and waning mental status and at times minimally responsive versus others where she is noted to talk. -Patient initially noted to be on Provigil which was uptitrated however patient developed tremulousness and subsequently discontinued. -Patient also underwent trial of amantadine which was discontinued. -Patient with prior history of embolic infarcts imaging on admission showed punctate acute infarct in the left superior cerebellum adjacent to superior cerebellar peduncle.  Bilateral thalamic acute infarct extending back into the midbrain consistent with artery of Percheron infarction. -Patient noted to have an acute event with asymmetric pupils 2/25 underwent repeat MRI with no acute findings however expected evolution of known infarcts. -EEG done 03/19/2020 with moderate diffuse encephalopathy with no apparent underlying seizure activity. -Continue Plavix for secondary stroke prophylaxis. -Statin on hold secondary to transaminitis.  LFTs trending down.  Follow.  2.  Hypertonicity and spasticity secondary to recent brain injury from stroke -Patient noted to have significant hypertonicity lower extremities affecting working with PT and OT efficiently. -Patient also noted to have spasticity related tremors in upper extremities -Skelaxin and Robaxin held secondary to transaminitis.  LFTs trending down. -Could possibly resume Robaxin. -Decreased current regimen of low-dose OxyIR 2.5 mg to every 8 hours and continue to wean to every 12 hours tomorrow.  -Dr. Riley Kill, consulted via secure chat per Junious Silk, NP to assist with initiating/titrating medications for spasticity and hypertonicity.  3.  Recurrent urinary retention Patient noted to have required I/O cath. -Foley catheter inserted 04/17/2020 with 900 cc of urine returning. -Concerned that urinary retention may be secondary to medications which have been discontinued. -Output of 1.6 L with 650 cc from right nephrostomy  tube, 950 cc from Foley catheter.   -Voiding trial in 3 days.  4.  Group B strep UTI/recurrent fevers/MRSA UTI (3/15) -Patient noted to have GBS UTI from 212 status post treatment with IV Rocephin. -2/27 noted to have yeast UTI status post course of fluconazole -Patient noted to have fever 2/27, chest x-ray with bibasilar opacities, urine culture with yeast status post completion of fluconazole. -Blood cultures 1/2 with staph epidermis likely contamination. -Patient noted to have low-grade temp 100.2 on 3/5 and temp of 101.1 3/14 and subsequently pancultured. -Urinalysis abnormal, chest x-ray unremarkable with improvement of bibasilar atelectasis. -Fever curve trending down. -Urine cultures with 80,000 colonies of MRSA.   -Due to ongoing fevers, patient started on IV doxycycline and treat for total of 3 to 5 days.   5.  Dysphagia/nutrition -Patient with inadequate oral intake.   -Status post PEG tube placement 04/17/2020  -Continue tube feeds  -Dietitian following.    6.  AKI on chronic kidney disease stage IIIA/right-sided hydronephrosis -Baseline creatinine 1-1.3. -Early on during the hospitalization patient noted to have worsening renal function renal ultrasound done consistent with a severe right-sided hydronephrosis. -Neurology/urology consulted patient status post right nephrostomy tube placement per IR 03/27/2020 -Due to urinary retention Foley catheter was placed. -Abdominal ultrasound on 04/18/2020 with resolution of previously noted right hydronephrosis on CT. -Creatinine improving and currently around baseline of 1.00. -Outpatient follow-up with urology.  7.  Diabetes mellitus type 2 Hemoglobin A1c 7.6.   -CBG 153 this morning.   -Levemir 5 units daily.  SSI.   8.  Chronic diastolic heart failure/hypertension 2D echo with grade 2 diastolic dysfunction. -Euvolemic.   -Continue Plavix, Norvasc.    9.  Residual left hemiplegia from prior  stroke/debility/deconditioning -Continue PT/OT.   -Needs SNF.   10.  Gluteal skin breakdown -Barrier cream, Desitin -Air mattress -Frequent turns.  11.  Hypothyroidism -Continue Synthroid.  Outpatient follow-u  12.  Hyperkalemia Resolved.  Repeat labs in the morning.         DVT prophylaxis: Heparin Code Status: DNR Family Communication: Updated husband at bedside. Disposition:   Status is: Inpatient    Dispo: The patient is from: Home              Anticipated d/c is to: SNF              Patient currently Patient very slow to progress with PT and OT and unclear if she is an appropriate rehab candidate for SNF or will need long-term placement.  Had PEG tube placed on 3/8.  Family has confirmed that they do not have the resources to pay for skilled nursing facility services out-of-pocket once insurance benefits exhausted.   Difficult to place patient yes       Consultants:   Interventional radiology: Dr. Grace Isaac 04/11/2020  Urology: Dr. Marlou Porch 03/25/2020  Nephrology: Dr. Melanee Spry 03/25/2020  Palliative care: Lamarr Lulas 2, NP 03/21/2020  Neurology: Dr. Pearlean Brownie 03/18/2020  Procedures:   Right percutaneous nephrostomy 03/28/2019  CT angiogram head and neck 03/18/2020  MRI brain 03/19/2020, 03/31/2020  Gastrostomy tube placement under fluoroscopy per Dr. Deanne Coffer, interventional radiology 04/17/2020  Abdominal ultrasound 04/18/2020  Renal ultrasound 03/25/2020, 03/27/2020  CT chest abdomen and pelvis 03/25/2020  CT head 03/18/2020  MR venogram head with and without contrast 03/21/2020  Antimicrobials:  Anti-infectives (From admission, onward)   Start     Dose/Rate Route Frequency Ordered Stop   04/20/20 1000  doxycycline (VIBRAMYCIN) 100 mg in sodium chloride 0.9 % 250  mL IVPB        100 mg 125 mL/hr over 120 Minutes Intravenous Every 12 hours 04/20/20 0831 04/25/20 0959   04/17/20 0807  ceFAZolin (ANCEF) 2-4 GM/100ML-% IVPB       Note to Pharmacy: Cristy Friedlanderusterman,  Kyle   : cabinet override      04/17/20 0807 04/17/20 0847   04/07/20 2000  fluconazole (DIFLUCAN) 10 MG/ML suspension 100 mg        100 mg Per Tube Every evening 04/07/20 1236 04/08/20 2023   04/03/20 2000  fluconazole (DIFLUCAN) 40 MG/ML suspension 100 mg  Status:  Discontinued       "Followed by" Linked Group Details   100 mg Per Tube Every 24 hours 04/02/20 1752 04/07/20 1236   04/02/20 1900  fluconazole (DIFLUCAN) 40 MG/ML suspension 200 mg       "Followed by" Linked Group Details   200 mg Per Tube  Once 04/02/20 1752 04/02/20 2020   03/26/20 0730  cefTRIAXone (ROCEPHIN) 1 g in sodium chloride 0.9 % 100 mL IVPB        1 g 200 mL/hr over 30 Minutes Intravenous Every 24 hours 03/26/20 0644 03/30/20 0844   03/18/20 1900  azithromycin (ZITHROMAX) 500 mg in sodium chloride 0.9 % 250 mL IVPB  Status:  Discontinued        500 mg 250 mL/hr over 60 Minutes Intravenous Every 24 hours 03/18/20 1830 03/19/20 1106   03/18/20 1900  cefTRIAXone (ROCEPHIN) 2 g in sodium chloride 0.9 % 100 mL IVPB  Status:  Discontinued        2 g 200 mL/hr over 30 Minutes Intravenous Every 24 hours 03/18/20 1830 03/19/20 1106       Subjective: Patient minimally responsive, however per husband spoke a little bit to her today and more responsive today than she was yesterday.  Patient with waxing and waning mental status.   Objective: Vitals:   04/21/20 0818 04/21/20 1141 04/21/20 1214 04/21/20 1606  BP: 120/68 (!) 133/91 120/80 122/61  Pulse: 67 (!) 106 76 60  Resp: 18 18 20 16   Temp: 98.8 F (37.1 C) 99.5 F (37.5 C) 98.2 F (36.8 C) 99.5 F (37.5 C)  TempSrc: Oral Axillary Axillary Oral  SpO2: 96% 96% 95% 96%  Weight:      Height:        Intake/Output Summary (Last 24 hours) at 04/21/2020 1836 Last data filed at 04/21/2020 1057 Gross per 24 hour  Intake 341.54 ml  Output 1352 ml  Net -1010.46 ml   Filed Weights   04/04/20 0438 04/05/20 0500 04/08/20 0423  Weight: 81.8 kg 79.5 kg 80 kg     Examination:  General exam: Sleeping.  Minimally responsive. Respiratory system: CTA B anterior lung fields.  No wheezes, no crackles, no rhonchi.  Normal respiratory effort.    Cardiovascular system: RRR no murmurs rubs or gallops.  No JVD.  No lower extremity edema.  Gastrointestinal system: Abdomen is soft, nontender, nondistended, positive bowel sounds.  No rebound.  No guarding.  Central nervous system: Sleeping heavily.  Minimally responsive.  Bilateral upper extremity in splints with tenderness to palpation. Spasticity right upper extremity improving.  Hypertonicity bilateral lower extremities but with pain. Extremities: Symmetric 5 x 5 power. Skin: No rashes, lesions or ulcers Psychiatry: Judgement and insight unable to assess. Mood & affect appropriate.     Data Reviewed: I have personally reviewed following labs and imaging studies  CBC: Recent Labs  Lab 04/15/20 0258 04/16/20 0335  04/17/20 0202 04/19/20 0421 04/20/20 0417  WBC 6.3 6.7 6.2 5.3 5.4  NEUTROABS 3.7 4.5 4.1 3.4  --   HGB 11.4* 11.4* 11.0* 10.3* 11.1*  HCT 34.6* 33.7* 35.1* 31.9* 34.5*  MCV 98.3 97.1 99.4 99.7 98.6  PLT 337 356 293 250 286    Basic Metabolic Panel: Recent Labs  Lab 04/16/20 0335 04/17/20 0202 04/18/20 0234 04/19/20 0421 04/20/20 0417  NA 136 135 136 138 137  K 5.0 4.9 4.8 4.0 4.5  CL 98 98 99 102 98  CO2 GLUCOSE 86 125* 169* 179* 176*  BUN 63* 60* 52* 44* 40*  CREATININE 1.26* 1.25* 1.26* 1.08* 1.00  CALCIUM 9.8 9.8 9.8 9.6 9.7  MG 2.3 2.3  --   --   --   PHOS 4.3 4.5  --   --   --     GFR: Estimated Creatinine Clearance: 41.2 mL/min (by C-G formula based on SCr of 1 mg/dL).  Liver Function Tests: Recent Labs  Lab 04/16/20 0335 04/17/20 0202 04/18/20 0234 04/19/20 0421 04/20/20 0417  AST 164* 63* 144* 65* 34  ALT 89* 64* 67* 67* 45*  ALKPHOS 329* 268* 326* 287* 277*  BILITOT 0.9 0.8 2.1* 0.7 0.6  PROT 6.4* 6.7 6.0* 5.8* 6.2*  ALBUMIN 2.7*  2.7* 2.4* 2.2* 2.3*    CBG: Recent Labs  Lab 04/20/20 2354 04/21/20 0418 04/21/20 0815 04/21/20 1135 04/21/20 1618  GLUCAP 129* 157* 153* 189* 172*     Recent Results (from the past 240 hour(s))  Surgical pcr screen     Status: Abnormal   Collection Time: 04/16/20  7:28 PM   Specimen: Nasal Mucosa; Nasal Swab  Result Value Ref Range Status   MRSA, PCR NEGATIVE NEGATIVE Final   Staphylococcus aureus POSITIVE (A) NEGATIVE Final    Comment: (NOTE) The Xpert SA Assay (FDA approved for NASAL specimens in patients 70 years of age and older), is one component of a comprehensive surveillance program. It is not intended to diagnose infection nor to guide or monitor treatment. Performed at [facility], [address] [place], [place] [address]   Culture, blood (routine x 2)     Status: None (Preliminary result)   Collection Time: 04/17/20  4:48 PM   Specimen: BLOOD  Result Value Ref Range Status   Specimen Description BLOOD LEFT ANTECUBITAL  Final   Special Requests   Final    BOTTLES DRAWN AEROBIC ONLY Blood Culture adequate volume   Culture   Final    NO GROWTH 4 DAYS Performed at [facility], [address], [place], [place] [address]    Report Status PENDING  Incomplete  Culture, blood (routine x 2)     Status: Abnormal   Collection Time: 04/17/20  4:48 PM   Specimen: BLOOD RIGHT HAND  Result Value Ref Range Status   Specimen Description BLOOD RIGHT HAND  Final   Special Requests   Final    BOTTLES DRAWN AEROBIC ONLY Blood Culture adequate volume   Culture  Setup Time   Final    AEROBIC BOTTLE ONLY GRAM POSITIVE COCCI CRITICAL RESULT CALLED TO, READ BACK BY AND VERIFIED WITH: PHARMD THOMAS J.  [id number] [phone] FCP    Culture (A)  Final    STAPHYLOCOCCUS EPIDERMIDIS THE SIGNIFICANCE OF ISOLATING THIS ORGANISM FROM A SINGLE SET OF BLOOD CULTURES WHEN MULTIPLE SETS ARE DRAWN IS UNCERTAIN. PLEASE NOTIFY THE MICROBIOLOGY DEPARTMENT WITHIN ONE WEEK IF SPECIATION  AND SENSITIVITIES ARE REQUIRED. Performed at [facility], [address] [place], [place] [address]    Report Status 04/21/2020 FINAL  Final  Blood Culture ID Panel (Reflexed)     Status: Abnormal   Collection Time: 04/17/20  4:48 PM  Result Value Ref Range Status   Enterococcus faecalis NOT DETECTED NOT DETECTED Final   Enterococcus Faecium NOT DETECTED NOT DETECTED Final   Listeria monocytogenes NOT DETECTED NOT DETECTED Final   Staphylococcus species DETECTED (A) NOT DETECTED Final    Comment: CRITICAL RESULT CALLED TO, READ BACK BY AND VERIFIED WITH: PHARMD THOMAS J.  [phone] FCP    Staphylococcus aureus (BCID) NOT DETECTED NOT DETECTED Final   Staphylococcus epidermidis DETECTED (A) NOT DETECTED Final    Comment: Methicillin (oxacillin) resistant coagulase negative staphylococcus. Possible blood culture contaminant (unless isolated from more than one blood culture draw or clinical case suggests pathogenicity). No antibiotic treatment is indicated for blood  culture contaminants. CRITICAL RESULT CALLED TO, READ BACK BY AND VERIFIED WITH: PHARMD THOMAS J.  [id number] [phone] FCP    Staphylococcus lugdunensis NOT DETECTED NOT DETECTED Final   Streptococcus species NOT DETECTED NOT DETECTED Final   Streptococcus agalactiae NOT DETECTED NOT DETECTED Final   Streptococcus pneumoniae NOT DETECTED NOT DETECTED Final   Streptococcus pyogenes NOT DETECTED NOT DETECTED Final   A.calcoaceticus-baumannii NOT DETECTED NOT DETECTED Final   Bacteroides fragilis NOT DETECTED NOT DETECTED Final   Enterobacterales NOT DETECTED NOT DETECTED Final   Enterobacter cloacae complex NOT DETECTED NOT DETECTED Final   Escherichia coli NOT DETECTED NOT DETECTED Final   Klebsiella aerogenes NOT DETECTED NOT DETECTED Final   Klebsiella oxytoca NOT DETECTED NOT DETECTED Final   Klebsiella pneumoniae NOT DETECTED NOT DETECTED Final   Proteus species NOT DETECTED NOT DETECTED Final   Salmonella species  NOT DETECTED NOT DETECTED Final   Serratia marcescens NOT DETECTED NOT DETECTED Final   Haemophilus influenzae NOT DETECTED NOT DETECTED Final   Neisseria meningitidis NOT DETECTED NOT DETECTED Final   Pseudomonas aeruginosa NOT DETECTED NOT DETECTED Final   Stenotrophomonas maltophilia NOT DETECTED NOT DETECTED Final   Candida albicans NOT DETECTED NOT DETECTED Final   Candida auris NOT DETECTED NOT DETECTED Final   Candida glabrata NOT DETECTED NOT DETECTED Final   Candida krusei NOT DETECTED NOT DETECTED Final   Candida parapsilosis NOT DETECTED NOT DETECTED Final   Candida tropicalis NOT DETECTED NOT DETECTED Final   Cryptococcus neoformans/gattii NOT DETECTED NOT DETECTED Final   Methicillin resistance mecA/C DETECTED (A) NOT DETECTED Final    Comment: CRITICAL RESULT CALLED TO, READ BACK BY AND VERIFIED WITH: Marissa Nestle  [id number] [phone] FCP Performed at [facility], [address] [place], [place] [address]   Culture, Urine     Status: Abnormal   Collection Time: 04/18/20  3:57 AM   Specimen: Urine, Catheterized  Result Value Ref Range Status   Specimen Description URINE, CATHETERIZED  Final   Special Requests   Final    NONE Performed at [facility], [address] [place], [place] [address]    Culture (A)  Final    80,000 COLONIES/mL METHICILLIN RESISTANT STAPHYLOCOCCUS AUREUS   Report Status 04/20/2020 FINAL  Final   Organism ID, Bacteria METHICILLIN RESISTANT STAPHYLOCOCCUS AUREUS (A)  Final      Susceptibility   Methicillin resistant staphylococcus aureus - MIC*    CIPROFLOXACIN >=8 RESISTANT Resistant     GENTAMICIN <=0.5 SENSITIVE Sensitive  NITROFURANTOIN <=16 SENSITIVE Sensitive     OXACILLIN >=4 RESISTANT Resistant     TETRACYCLINE <=1 SENSITIVE Sensitive     VANCOMYCIN <=0.5 SENSITIVE Sensitive     TRIMETH/SULFA <=10 SENSITIVE Sensitive     CLINDAMYCIN <=0.25 SENSITIVE Sensitive     RIFAMPIN <=0.5 SENSITIVE Sensitive     Inducible  Clindamycin NEGATIVE Sensitive     * 80,000 COLONIES/mL METHICILLIN RESISTANT STAPHYLOCOCCUS AUREUS         Radiology Studies: No results found.      Scheduled Meds: . amLODipine  5 mg Per Tube Daily  . chlorhexidine  15 mL Mouth Rinse BID  . Chlorhexidine Gluconate Cloth  6 each Topical Daily  . clopidogrel  75 mg Per Tube Daily  . feeding supplement (PROSource TF)  45 mL Per Tube TID  . free water  200 mL Per Tube Q6H  . heparin  5,000 Units Subcutaneous Q8H  . insulin aspart  0-9 Units Subcutaneous Q4H  . insulin aspart  2 Units Subcutaneous Q4H  . insulin detemir  5 Units Subcutaneous Daily  . levothyroxine  112 mcg Per Tube QAC breakfast  . liver oil-zinc oxide   Topical BID  . mouth rinse  15 mL Mouth Rinse q12n4p  . modafinil  200 mg Per Tube Daily  . mupirocin ointment  1 application Nasal BID  . oxyCODONE  2.5 mg Per Tube Q8H  . sodium chloride flush  5 mL Intracatheter Q8H   Continuous Infusions: . sodium chloride 10 mL/hr at 03/24/20 0305  . doxycycline (VIBRAMYCIN) IV 100 mg (04/21/20 1123)  . feeding supplement (GLUCERNA 1.2 CAL) 45 mL/hr at 04/19/20 1820     LOS: 34 days    Time spent: 35 minutes    Ramiro Harvest, MD [facility]   To contact the attending provider between 7A-7P or the covering provider during after hours 7P-7A, please log into the web site [email or web address] and access using universal [place] password for that web site. If you do not have the password, please call the hospital operator.  04/21/2020, 6:36 PM

## 2020-04-21 NOTE — Progress Notes (Signed)
Physical Therapy Treatment Patient Details Name: Hannah Fischer MRN: [id number] DOB: May 03, 1932 Today's Date: 04/21/2020    History of Present Illness Pt is an 87 y/o female admitted 03/18/20 with CVA. Imaging (+) for bilateral medial thalamic and punctate infarcts and L superior cerebellar infarcts. PEG placed 3/14. PMH significant for multiple CVAs with most recent ~3 months ago, residual L-sided hemiplegia with recent d/c from outpatient PT/OT with return to baseline level of function, DM, PE, HTN.    PT Comments    Patient limited by lethargy and level of arousal. Patient able to keep eyes open for ~1 minute after cleaning of eyes. Patient grunting with pain and questions. Performed PROM of B UEs and LEs with unpurposeful resisting of movements. Continue to recommend SNF for ongoing Physical Therapy.       Follow Up Recommendations  SNF;Supervision/Assistance - 24 hour     Equipment Recommendations  Wheelchair cushion (measurements PT);Wheelchair (measurements PT);Hospital bed    Recommendations for Other Services       Precautions / Restrictions Precautions Precautions: Fall Precaution Comments: nephrostomy drain, at risk for skin breakdown, PEG Restrictions Weight Bearing Restrictions: No    Mobility  Bed Mobility                    Transfers                    Ambulation/Gait                 Stairs             Wheelchair Mobility    Modified Rankin (Stroke Patients Only) Modified Rankin (Stroke Patients Only) Pre-Morbid Rankin Score: No significant disability Modified Rankin: Severe disability     Balance                                            Cognition Arousal/Alertness: Lethargic Behavior During Therapy: Flat affect Overall Cognitive Status: Difficult to assess                                        Exercises General Exercises - Upper Extremity Shoulder Flexion: PROM;10  reps;Supine;Right;Left Elbow Flexion: PROM;Left;Right;10 reps;Supine Elbow Extension: PROM;Right;Left;10 reps;Supine Wrist Flexion: PROM;10 reps;Right;Left;Supine Wrist Extension: PROM;Right;Left;10 reps;Supine General Exercises - Lower Extremity Ankle Circles/Pumps: PROM;10 reps;Right;Left Heel Slides: PROM;10 reps;Right;Left    General Comments        Pertinent Vitals/Pain Pain Assessment: Faces Faces Pain Scale: Hurts little more Pain Location: L UE with touch, movement Pain Descriptors / Indicators: Grimacing Pain Intervention(s): Monitored during session    Home Living                      Prior Function            PT Goals (current goals can now be found in the care plan section) Acute Rehab PT Goals Patient Stated Goal: Unable to state. PT Goal Formulation: Patient unable to participate in goal setting Time For Goal Achievement: 05/01/20 Potential to Achieve Goals: Poor Progress towards PT goals: Not progressing toward goals - comment (limited by level of arousal)    Frequency    Min 3X/week      PT Plan Current plan remains appropriate    Co-evaluation  AM-PAC PT "6 Clicks" Mobility   Outcome Measure  Help needed turning from your back to your side while in a flat bed without using bedrails?: Total Help needed moving from lying on your back to sitting on the side of a flat bed without using bedrails?: Total Help needed moving to and from a bed to a chair (including a wheelchair)?: Total Help needed standing up from a chair using your arms (e.g., wheelchair or bedside chair)?: Total Help needed to walk in hospital room?: Total Help needed climbing 3-5 steps with a railing? : Total 6 Click Score: 6    End of Session   Activity Tolerance: Patient limited by lethargy Patient left: with call bell/phone within reach;in bed Nurse Communication: Mobility status;Need for lift equipment PT Visit Diagnosis: Other symptoms and signs  involving the nervous system (R29.898);Difficulty in walking, not elsewhere classified (R26.2);Unsteadiness on feet (R26.81);Muscle weakness (generalized) (M62.81)     Time: 4599-7741 PT Time Calculation (min) (ACUTE ONLY): 16 min  Charges:  $Therapeutic Exercise: 8-22 mins                     Oviya Ammar A. Dan Humphreys PT, DPT Acute Rehabilitation Services Pager [phone] Office [phone]    Viviann Spare 04/21/2020, 3:09 PM

## 2020-04-22 ENCOUNTER — Inpatient Hospital Stay (HOSPITAL_COMMUNITY): Payer: Medicare Other

## 2020-04-22 DIAGNOSIS — I639 Cerebral infarction, unspecified: Secondary | ICD-10-CM | POA: Diagnosis not present

## 2020-04-22 DIAGNOSIS — N179 Acute kidney failure, unspecified: Secondary | ICD-10-CM | POA: Diagnosis not present

## 2020-04-22 DIAGNOSIS — G9340 Encephalopathy, unspecified: Secondary | ICD-10-CM | POA: Diagnosis not present

## 2020-04-22 DIAGNOSIS — I5032 Chronic diastolic (congestive) heart failure: Secondary | ICD-10-CM | POA: Diagnosis not present

## 2020-04-22 LAB — CBC
HCT: 33.4 % — ABNORMAL LOW (ref 36.0–46.0)
Hemoglobin: 10.9 g/dL — ABNORMAL LOW (ref 12.0–15.0)
MCH: 32 pg (ref 26.0–34.0)
MCHC: 32.6 g/dL (ref 30.0–36.0)
MCV: 97.9 fL (ref 80.0–100.0)
Platelets: 299 10*3/uL (ref 150–400)
RBC: 3.41 MIL/uL — ABNORMAL LOW (ref 3.87–5.11)
RDW: 13.6 % (ref 11.5–15.5)
WBC: 5.8 10*3/uL (ref 4.0–10.5)
nRBC: 0 % (ref 0.0–0.2)

## 2020-04-22 LAB — COMPREHENSIVE METABOLIC PANEL
ALT: 30 U/L (ref 0–44)
AST: 21 U/L (ref 15–41)
Albumin: 2.3 g/dL — ABNORMAL LOW (ref 3.5–5.0)
Alkaline Phosphatase: 190 U/L — ABNORMAL HIGH (ref 38–126)
Anion gap: 6 (ref 5–15)
BUN: 40 mg/dL — ABNORMAL HIGH (ref 8–23)
CO2: 30 mmol/L (ref 22–32)
Calcium: 9.4 mg/dL (ref 8.9–10.3)
Chloride: 100 mmol/L (ref 98–111)
Creatinine, Ser: 0.98 mg/dL (ref 0.44–1.00)
GFR, Estimated: 57 mL/min — ABNORMAL LOW (ref >60)
Glucose, Bld: 137 mg/dL — ABNORMAL HIGH (ref 70–99)
Potassium: 4.6 mmol/L (ref 3.5–5.1)
Sodium: 136 mmol/L (ref 135–145)
Total Bilirubin: 0.7 mg/dL (ref 0.3–1.2)
Total Protein: 6 g/dL — ABNORMAL LOW (ref 6.5–8.1)

## 2020-04-22 LAB — CULTURE, BLOOD (ROUTINE X 2)
Culture: NO GROWTH
Special Requests: ADEQUATE

## 2020-04-22 LAB — GLUCOSE, CAPILLARY
Glucose-Capillary: 120 mg/dL — ABNORMAL HIGH (ref 70–99)
Glucose-Capillary: 158 mg/dL — ABNORMAL HIGH (ref 70–99)
Glucose-Capillary: 130 mg/dL — ABNORMAL HIGH (ref 70–99)
Glucose-Capillary: 190 mg/dL — ABNORMAL HIGH (ref 70–99)
Glucose-Capillary: 144 mg/dL — ABNORMAL HIGH (ref 70–99)

## 2020-04-22 LAB — MAGNESIUM: Magnesium: 1.9 mg/dL (ref 1.7–2.4)

## 2020-04-22 LAB — AMMONIA: Ammonia: 12 umol/L (ref 9–35)

## 2020-04-22 MED ORDER — MORPHINE SULFATE (PF) 2 MG/ML IV SOLN
0.5000 mg | INTRAVENOUS | Status: DC | PRN
  Administered 2020-04-23 – 2020-04-27 (×2): 0.5 mg via INTRAVENOUS
  Filled 2020-04-22 (×4): qty 1

## 2020-04-22 NOTE — Progress Notes (Signed)
PROGRESS NOTE    Hannah Fischer  ASN:[id number] DOB: 06-02-1932 DOA: 03/18/2020 PCP: Sigmund Hazel, MD   Chief Complaint  Patient presents with  . 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 Shehad gone to bedin the morning her husbandhad trouble waking her up. In the ED she was noted to be responsive to pain. A code stroke was called. She was outside the window for TPA. An emergent CT did not show any acute abnormality. A CTA was performed and did not reveal any obvious stenosis. An MRI of the brain was performed on 2/13:Punctate acute infarction in the left superior cerebellumadjacent to the superior cerebellar peduncle. Bilateral thalamicacute infarction extending back into the mid brain. This is consistent with artery of Percheron infarction.She continues to wax and wane from a mental status standpoint. She had cortrak and subsequently underwent PEG tube placement.. Palliative and neuro are following,   Assessment & Plan:   Principal Problem:   Acute encephalopathy Active Problems:   Diabetes mellitus (HCC)   Hypothyroidism   History of embolic stroke   Acute bilateral thalamic infarction (HCC)   Dysphagia   CKD (chronic kidney disease), symptom management only, stage 3 (moderate) (HCC)   Hydronephrosis   Group B streptococcal UTI   Uncontrolled type 2 diabetes mellitus with hyperglycemia, with long-term current use of insulin (HCC)   Chronic diastolic heart failure (HCC)   Essential hypertension   Physical debility   Left hemiplegia (HCC)   Palliative care by specialist   Goals of care, counseling/discussion   DNR (do not resuscitate)   Fever   Acute lower UTI  1 bilateral thalamic infarct/persistent acute metabolic encephalopathy Secondary to acute CVA. -Patient  noted to have some waxing and waning mental status and at times minimally responsive versus others where she is noted to talk. -Patient initially noted to be on Provigil which was uptitrated however patient developed tremulousness and subsequently discontinued. -Patient also underwent trial of amantadine which was discontinued. -Patient with prior history of embolic infarcts imaging on admission showed punctate acute infarct in the left superior cerebellum adjacent to superior cerebellar peduncle.  Bilateral thalamic acute infarct extending back into the midbrain consistent with artery of Percheron infarction. -Patient noted to have an acute event with asymmetric pupils 2/25 underwent repeat MRI with no acute findings however expected evolution of known infarcts. -EEG done 03/19/2020 with moderate diffuse encephalopathy with no apparent underlying seizure activity. -Continue Plavix for secondary stroke prophylaxis. -Statin on hold secondary to transaminitis.  LFTs trending down.  Follow.  2.  Hypertonicity and spasticity secondary to recent brain injury from stroke -Patient noted to have significant hypertonicity lower extremities affecting working with PT and OT efficiently. -Patient also noted to have spasticity related tremors in upper extremities -Skelaxin and Robaxin held secondary to transaminitis.  LFTs trending down. -Could possibly resume Robaxin. -Continue decreased current regimen of low-dose OxyIR 2.5 mg to every 8 hours and continue to wean to every 12 hours tomorrow.  -Dr. Riley Kill, consulted via secure chat per Junious Silk, NP to assist with initiating/titrating medications for spasticity and hypertonicity.  3.  Recurrent urinary retention Patient noted to have required I/O cath. -Foley catheter inserted 04/17/2020 with 900 cc of urine returning. -Concerned that urinary retention may be secondary to medications which have been discontinued. -Output of 950 cc from Foley catheter.    -  Voiding trial in 3 days.  4.  Group B strep UTI/recurrent fevers/MRSA UTI (3/15) -Patient noted to have GBS UTI from 2/12 status post treatment with IV Rocephin. -2/27 noted to have yeast UTI status post course of fluconazole -Patient noted to have fever 2/27, chest x-ray with bibasilar opacities, urine culture with yeast status post completion of fluconazole. -Blood cultures 1/2 with staph epidermis likely contamination. -Patient noted to have low-grade temp 100.2 on 3/5 and temp of 101.1 3/14 and subsequently pancultured. -Urinalysis abnormal, chest x-ray unremarkable with improvement of bibasilar atelectasis. -Fever curve trending down. -Urine cultures with 80,000 colonies of MRSA.   -Due to ongoing fevers, patient started on IV doxycycline and treat for total of 3 to 5 days.   5.  Dysphagia/nutrition -Patient with inadequate oral intake.   -Status post PEG tube placement 04/17/2020  -Continue tube feeds  -Dietitian following.    6.  AKI on chronic kidney disease stage IIIA/right-sided hydronephrosis -Baseline creatinine 1-1.3. -Early on during the hospitalization patient noted to have worsening renal function renal ultrasound done consistent with a severe right-sided hydronephrosis. -Neurology/urology consulted patient status post right nephrostomy tube placement per IR 03/27/2020 -Due to urinary retention Foley catheter was placed. -Urine output 950 cc over the past 24 hours. -Abdominal ultrasound on 04/18/2020 with resolution of previously noted right hydronephrosis on CT. -Creatinine improving and currently around baseline of 1.00. -Outpatient follow-up with urology.  7.  Diabetes mellitus type 2 Hemoglobin A1c 7.6.   -CBG 158 this morning.   -CBG 153 this morning.   -Continue Levemir 5 units daily.  NovoLog 2 units every 4 hours.  SSI.  8.  Chronic diastolic heart failure/hypertension 2D echo with grade 2 diastolic dysfunction. -Euvolemic.   -Continue Plavix, Norvasc.     9.  Residual left hemiplegia from prior stroke/debility/deconditioning -PT/OT.   -Needs SNF  10.  Gluteal skin breakdown -Barrier cream, Desitin -Air mattress -Frequent turns.  11.  Hypothyroidism -Synthroid daily.  Outpatient follow-up.   12.  Hyperkalemia Resolved.        DVT prophylaxis: Heparin Code Status: DNR Family Communication: No family at bedside. Disposition:   Status is: Inpatient    Dispo: The patient is from: Home              Anticipated d/c is to: SNF              Patient currently Patient very slow to progress with PT and OT and unclear if she is an appropriate rehab candidate for SNF or will need long-term placement.  Had PEG tube placed on 3/8.  Family has confirmed that they do not have the resources to pay for skilled nursing facility services out-of-pocket once insurance benefits exhausted.   Difficult to place patient yes       Consultants:   Interventional radiology: Dr. Grace Isaac 04/11/2020  Urology: Dr. Marlou Porch 03/25/2020  Nephrology: Dr. Melanee Spry 03/25/2020  Palliative care: Lamarr Lulas 2, NP 03/21/2020  Neurology: Dr. Pearlean Brownie 03/18/2020  Procedures:   Right percutaneous nephrostomy 03/28/2019  CT angiogram head and neck 03/18/2020  MRI brain 03/19/2020, 03/31/2020  Gastrostomy tube placement under fluoroscopy per Dr. Deanne Coffer, interventional radiology 04/17/2020  Abdominal ultrasound 04/18/2020  Renal ultrasound 03/25/2020, 03/27/2020  CT chest abdomen and pelvis 03/25/2020  CT head 03/18/2020  MR venogram head with and without contrast 03/21/2020  Antimicrobials:  Anti-infectives (From admission, onward)   Start     Dose/Rate Route Frequency Ordered Stop   04/20/20 1000  doxycycline (VIBRAMYCIN) 100 mg  in sodium chloride 0.9 % 250 mL IVPB        100 mg 125 mL/hr over 120 Minutes Intravenous Every 12 hours 04/20/20 0831 04/25/20 0959   04/17/20 0807  ceFAZolin (ANCEF) 2-4 GM/100ML-% IVPB       Note to Pharmacy: Cristy Friedlanderusterman,  Kyle   : cabinet override      04/17/20 0807 04/17/20 0847   04/07/20 2000  fluconazole (DIFLUCAN) 10 MG/ML suspension 100 mg        100 mg Per Tube Every evening 04/07/20 1236 04/08/20 2023   04/03/20 2000  fluconazole (DIFLUCAN) 40 MG/ML suspension 100 mg  Status:  Discontinued       "Followed by" Linked Group Details   100 mg Per Tube Every 24 hours 04/02/20 1752 04/07/20 1236   04/02/20 1900  fluconazole (DIFLUCAN) 40 MG/ML suspension 200 mg       "Followed by" Linked Group Details   200 mg Per Tube  Once 04/02/20 1752 04/02/20 2020   03/26/20 0730  cefTRIAXone (ROCEPHIN) 1 g in sodium chloride 0.9 % 100 mL IVPB        1 g 200 mL/hr over 30 Minutes Intravenous Every 24 hours 03/26/20 0644 03/30/20 0844   03/18/20 1900  azithromycin (ZITHROMAX) 500 mg in sodium chloride 0.9 % 250 mL IVPB  Status:  Discontinued        500 mg 250 mL/hr over 60 Minutes Intravenous Every 24 hours 03/18/20 1830 03/19/20 1106   03/18/20 1900  cefTRIAXone (ROCEPHIN) 2 g in sodium chloride 0.9 % 100 mL IVPB  Status:  Discontinued        2 g 200 mL/hr over 30 Minutes Intravenous Every 24 hours 03/18/20 1830 03/19/20 1106       Subjective: Patient minimally responsive.  Patient noted to have been having waxing and waning mental status during this hospitalization.    Objective: Vitals:   04/22/20 0426 04/22/20 0813 04/22/20 1100 04/22/20 1533  BP: (!) 124/55 (!) 126/55 (!) 152/101 124/70  Pulse: 73 69 81 77  Resp: 18 (!) 22 19 (!) 32  Temp: 98.4 F (36.9 C) 98.6 F (37 C) (!) 97.5 F (36.4 C) 98.2 F (36.8 C)  TempSrc: Oral Oral Oral Oral  SpO2: 93% 95%  96%  Weight:      Height:        Intake/Output Summary (Last 24 hours) at 04/22/2020 1534 Last data filed at 04/22/2020 0200 Gross per 24 hour  Intake 543.37 ml  Output 401 ml  Net 142.37 ml   Filed Weights   04/04/20 0438 04/05/20 0500 04/08/20 0423  Weight: 81.8 kg 79.5 kg 80 kg    Examination:  General exam: Sleeping.  Minimally  responsive. Respiratory system: CTA B anterior lung fields.  No wheezes, no crackles, no rhonchi.  Normal respiratory effort. Cardiovascular system: Regular rate rhythm no murmurs rubs or gallops.  No JVD.  No lower extremity edema.  Gastrointestinal system: Abdomen is soft, nontender, nondistended, positive bowel sounds.  No rebound.  No guarding. Central nervous system: Sleeping heavily.  Minimally responsive.  Bilateral upper extremity in splints with some tenderness to palpation.  Improving spasticity right upper extremity.  Hypertonicity bilateral lower extremities but with pain. Extremities: Symmetric 5 x 5 power. Skin: No rashes, lesions or ulcers Psychiatry: Judgement and insight unable to assess. Mood & affect appropriate.     Data Reviewed: I have personally reviewed following labs and imaging studies  CBC: Recent Labs  Lab 04/16/20 0335 04/17/20 0202  04/19/20 0421 04/20/20 0417 04/22/20 0055  WBC 6.7 6.2 5.3 5.4 5.8  NEUTROABS 4.5 4.1 3.4  --   --   HGB 11.4* 11.0* 10.3* 11.1* 10.9*  HCT 33.7* 35.1* 31.9* 34.5* 33.4*  MCV 97.1 99.4 99.7 98.6 97.9  PLT 356 293 250 286 299    Basic Metabolic Panel: Recent Labs  Lab 04/16/20 0335 04/17/20 0202 04/18/20 0234 04/19/20 0421 04/20/20 0417 04/22/20 0055  NA 136 135 136 138 137 136  K 5.0 4.9 4.8 4.0 4.5 4.6  CL 98 98 99 102 98 100  CO2 29 26 29 29 29 30   GLUCOSE 86 125* 169* 179* 176* 137*  BUN 63* 60* 52* 44* 40* 40*  CREATININE 1.26* 1.25* 1.26* 1.08* 1.00 0.98  CALCIUM 9.8 9.8 9.8 9.6 9.7 9.4  MG 2.3 2.3  --   --   --  1.9  PHOS 4.3 4.5  --   --   --   --     GFR: Estimated Creatinine Clearance: 42 mL/min (by C-G formula based on SCr of 0.98 mg/dL).  Liver Function Tests: Recent Labs  Lab 04/17/20 0202 04/18/20 0234 04/19/20 0421 04/20/20 0417 04/22/20 0055  AST 63* 144* 65* 34 21  ALT 64* 67* 67* 45* 30  ALKPHOS 268* 326* 287* 277* 190*  BILITOT 0.8 2.1* 0.7 0.6 0.7  PROT 6.7 6.0* 5.8* 6.2* 6.0*   ALBUMIN 2.7* 2.4* 2.2* 2.3* 2.3*    CBG: Recent Labs  Lab 04/21/20 2345 04/22/20 0424 04/22/20 0808 04/22/20 1107 04/22/20 1528  GLUCAP 122* 120* 158* 130* 190*     Recent Results (from the past 240 hour(s))  Surgical pcr screen     Status: Abnormal   Collection Time: 04/16/20  7:28 PM   Specimen: Nasal Mucosa; Nasal Swab  Result Value Ref Range Status   MRSA, PCR NEGATIVE NEGATIVE Final   Staphylococcus aureus POSITIVE (A) NEGATIVE Final    Comment: (NOTE) The Xpert SA Assay (FDA approved for NASAL specimens in patients 1 years of age and older), is one component of a comprehensive surveillance program. It is not intended to diagnose infection nor to guide or monitor treatment. Performed at [facility], [address], [place], [place]   Culture, blood (routine x 2)     Status: None   Collection Time: 04/17/20  4:48 PM   Specimen: BLOOD  Result Value Ref Range Status   Specimen Description BLOOD LEFT ANTECUBITAL  Final   Special Requests   Final    BOTTLES DRAWN AEROBIC ONLY Blood Culture adequate volume   Culture   Final    NO GROWTH 5 DAYS Performed at [facility], [address] [place], [place]    Report Status 04/22/2020 FINAL  Final  Culture, blood (routine x 2)     Status: Abnormal   Collection Time: 04/17/20  4:48 PM   Specimen: BLOOD RIGHT HAND  Result Value Ref Range Status   Specimen Description BLOOD RIGHT HAND  Final   Special Requests   Final    BOTTLES DRAWN AEROBIC ONLY Blood Culture adequate volume   Culture  Setup Time   Final    AEROBIC BOTTLE ONLY GRAM POSITIVE COCCI CRITICAL RESULT CALLED TO, READ BACK BY AND VERIFIED WITH: PHARMD THOMAS J.  04/19/20 [phone] FCP    Culture (A)  Final    STAPHYLOCOCCUS EPIDERMIDIS THE SIGNIFICANCE OF ISOLATING THIS ORGANISM FROM A SINGLE SET OF BLOOD CULTURES WHEN MULTIPLE SETS  ARE DRAWN IS UNCERTAIN. PLEASE NOTIFY THE MICROBIOLOGY DEPARTMENT WITHIN ONE WEEK IF SPECIATION AND  SENSITIVITIES ARE REQUIRED. Performed at [facility], [address], [place], [place] [address]    Report Status 04/21/2020 FINAL  Final  Blood Culture ID Panel (Reflexed)     Status: Abnormal   Collection Time: 04/17/20  4:48 PM  Result Value Ref Range Status   Enterococcus faecalis NOT DETECTED NOT DETECTED Final   Enterococcus Faecium NOT DETECTED NOT DETECTED Final   Listeria monocytogenes NOT DETECTED NOT DETECTED Final   Staphylococcus species DETECTED (A) NOT DETECTED Final    Comment: CRITICAL RESULT CALLED TO, READ BACK BY AND VERIFIED WITH: PHARMD THOMAS J.  [phone] FCP    Staphylococcus aureus (BCID) NOT DETECTED NOT DETECTED Final   Staphylococcus epidermidis DETECTED (A) NOT DETECTED Final    Comment: Methicillin (oxacillin) resistant coagulase negative staphylococcus. Possible blood culture contaminant (unless isolated from more than one blood culture draw or clinical case suggests pathogenicity). No antibiotic treatment is indicated for blood  culture contaminants. CRITICAL RESULT CALLED TO, READ BACK BY AND VERIFIED WITH: PHARMD THOMAS J.  [id number] [phone] FCP    Staphylococcus lugdunensis NOT DETECTED NOT DETECTED Final   Streptococcus species NOT DETECTED NOT DETECTED Final   Streptococcus agalactiae NOT DETECTED NOT DETECTED Final   Streptococcus pneumoniae NOT DETECTED NOT DETECTED Final   Streptococcus pyogenes NOT DETECTED NOT DETECTED Final   A.calcoaceticus-baumannii NOT DETECTED NOT DETECTED Final   Bacteroides fragilis NOT DETECTED NOT DETECTED Final   Enterobacterales NOT DETECTED NOT DETECTED Final   Enterobacter cloacae complex NOT DETECTED NOT DETECTED Final   Escherichia coli NOT DETECTED NOT DETECTED Final   Klebsiella aerogenes NOT DETECTED NOT DETECTED Final   Klebsiella oxytoca NOT DETECTED NOT DETECTED Final   Klebsiella pneumoniae NOT DETECTED NOT DETECTED Final   Proteus species NOT DETECTED NOT DETECTED Final   Salmonella species NOT  DETECTED NOT DETECTED Final   Serratia marcescens NOT DETECTED NOT DETECTED Final   Haemophilus influenzae NOT DETECTED NOT DETECTED Final   Neisseria meningitidis NOT DETECTED NOT DETECTED Final   Pseudomonas aeruginosa NOT DETECTED NOT DETECTED Final   Stenotrophomonas maltophilia NOT DETECTED NOT DETECTED Final   Candida albicans NOT DETECTED NOT DETECTED Final   Candida auris NOT DETECTED NOT DETECTED Final   Candida glabrata NOT DETECTED NOT DETECTED Final   Candida krusei NOT DETECTED NOT DETECTED Final   Candida parapsilosis NOT DETECTED NOT DETECTED Final   Candida tropicalis NOT DETECTED NOT DETECTED Final   Cryptococcus neoformans/gattii NOT DETECTED NOT DETECTED Final   Methicillin resistance mecA/C DETECTED (A) NOT DETECTED Final    Comment: CRITICAL RESULT CALLED TO, READ BACK BY AND VERIFIED WITH: Marissa Nestle  [id number] [phone] FCP Performed at [facility], [address], [place], [place] [address]   Culture, Urine     Status: Abnormal   Collection Time: 04/18/20  3:57 AM   Specimen: Urine, Catheterized  Result Value Ref Range Status   Specimen Description URINE, CATHETERIZED  Final   Special Requests   Final    NONE Performed at [facility], [address] [place], [place] [address]    Culture (A)  Final    80,000 COLONIES/mL METHICILLIN RESISTANT STAPHYLOCOCCUS AUREUS   Report Status 04/20/2020 FINAL  Final   Organism ID, Bacteria METHICILLIN RESISTANT STAPHYLOCOCCUS AUREUS (A)  Final      Susceptibility   Methicillin resistant staphylococcus aureus - MIC*  CIPROFLOXACIN >=8 RESISTANT Resistant     GENTAMICIN <=0.5 SENSITIVE Sensitive     NITROFURANTOIN <=16 SENSITIVE Sensitive     OXACILLIN >=4 RESISTANT Resistant     TETRACYCLINE <=1 SENSITIVE Sensitive     VANCOMYCIN <=0.5 SENSITIVE Sensitive     TRIMETH/SULFA <=10 SENSITIVE Sensitive     CLINDAMYCIN <=0.25 SENSITIVE Sensitive     RIFAMPIN <=0.5 SENSITIVE Sensitive     Inducible  Clindamycin NEGATIVE Sensitive     * 80,000 COLONIES/mL METHICILLIN RESISTANT STAPHYLOCOCCUS AUREUS         Radiology Studies: No results found.      Scheduled Meds: . amLODipine  5 mg Per Tube Daily  . chlorhexidine  15 mL Mouth Rinse BID  . Chlorhexidine Gluconate Cloth  6 each Topical Daily  . clopidogrel  75 mg Per Tube Daily  . feeding supplement (PROSource TF)  45 mL Per Tube TID  . free water  200 mL Per Tube Q6H  . heparin  5,000 Units Subcutaneous Q8H  . insulin aspart  0-9 Units Subcutaneous Q4H  . insulin aspart  2 Units Subcutaneous Q4H  . insulin detemir  5 Units Subcutaneous Daily  . levothyroxine  112 mcg Per Tube QAC breakfast  . liver oil-zinc oxide   Topical BID  . mouth rinse  15 mL Mouth Rinse q12n4p  . modafinil  200 mg Per Tube Daily  . mupirocin ointment  1 application Nasal BID  . oxyCODONE  2.5 mg Per Tube Q8H  . sodium chloride flush  5 mL Intracatheter Q8H   Continuous Infusions: . sodium chloride Stopped (04/21/20 2342)  . doxycycline (VIBRAMYCIN) IV 100 mg (04/22/20 1059)  . feeding supplement (GLUCERNA 1.2 CAL) 1,000 mL (04/21/20 1942)     LOS: 35 days    Time spent: 35 minutes    Ramiro Harvest, MD [facility]   To contact the attending provider between 7A-7P or the covering provider during after hours 7P-7A, please log into the web site [email or web address] and access using universal [place] password for that web site. If you do not have the password, please call the hospital operator.  04/22/2020, 3:34 PM

## 2020-04-23 ENCOUNTER — Inpatient Hospital Stay (HOSPITAL_COMMUNITY): Payer: Medicare Other

## 2020-04-23 DIAGNOSIS — Z794 Long term (current) use of insulin: Secondary | ICD-10-CM

## 2020-04-23 DIAGNOSIS — M79602 Pain in left arm: Secondary | ICD-10-CM | POA: Diagnosis not present

## 2020-04-23 DIAGNOSIS — M7989 Other specified soft tissue disorders: Secondary | ICD-10-CM

## 2020-04-23 DIAGNOSIS — R0682 Tachypnea, not elsewhere classified: Secondary | ICD-10-CM

## 2020-04-23 DIAGNOSIS — N390 Urinary tract infection, site not specified: Secondary | ICD-10-CM | POA: Diagnosis not present

## 2020-04-23 DIAGNOSIS — G9340 Encephalopathy, unspecified: Secondary | ICD-10-CM | POA: Diagnosis not present

## 2020-04-23 DIAGNOSIS — I639 Cerebral infarction, unspecified: Secondary | ICD-10-CM | POA: Diagnosis not present

## 2020-04-23 DIAGNOSIS — E1165 Type 2 diabetes mellitus with hyperglycemia: Secondary | ICD-10-CM

## 2020-04-23 DIAGNOSIS — N179 Acute kidney failure, unspecified: Secondary | ICD-10-CM | POA: Diagnosis not present

## 2020-04-23 LAB — MAGNESIUM: Magnesium: 2 mg/dL (ref 1.7–2.4)

## 2020-04-23 LAB — GLUCOSE, CAPILLARY
Glucose-Capillary: 112 mg/dL — ABNORMAL HIGH (ref 70–99)
Glucose-Capillary: 121 mg/dL — ABNORMAL HIGH (ref 70–99)
Glucose-Capillary: 136 mg/dL — ABNORMAL HIGH (ref 70–99)
Glucose-Capillary: 143 mg/dL — ABNORMAL HIGH (ref 70–99)
Glucose-Capillary: 145 mg/dL — ABNORMAL HIGH (ref 70–99)
Glucose-Capillary: 157 mg/dL — ABNORMAL HIGH (ref 70–99)
Glucose-Capillary: 189 mg/dL — ABNORMAL HIGH (ref 70–99)

## 2020-04-23 LAB — BASIC METABOLIC PANEL
Anion gap: 9 (ref 5–15)
BUN: 36 mg/dL — ABNORMAL HIGH (ref 8–23)
CO2: 29 mmol/L (ref 22–32)
Calcium: 9.4 mg/dL (ref 8.9–10.3)
Chloride: 98 mmol/L (ref 98–111)
Creatinine, Ser: 0.86 mg/dL (ref 0.44–1.00)
GFR, Estimated: >60 mL/min (ref >60)
Glucose, Bld: 139 mg/dL — ABNORMAL HIGH (ref 70–99)
Potassium: 4.2 mmol/L (ref 3.5–5.1)
Sodium: 136 mmol/L (ref 135–145)

## 2020-04-23 LAB — CBC WITH DIFFERENTIAL/PLATELET
Abs Immature Granulocytes: 0.05 10*3/uL (ref 0.00–0.07)
Basophils Absolute: 0.1 10*3/uL (ref 0.0–0.1)
Basophils Relative: 1 %
Eosinophils Absolute: 0.4 10*3/uL (ref 0.0–0.5)
Eosinophils Relative: 6 %
HCT: 33.1 % — ABNORMAL LOW (ref 36.0–46.0)
Hemoglobin: 10.8 g/dL — ABNORMAL LOW (ref 12.0–15.0)
Immature Granulocytes: 1 %
Lymphocytes Relative: 18 %
Lymphs Abs: 1 10*3/uL (ref 0.7–4.0)
MCH: 32 pg (ref 26.0–34.0)
MCHC: 32.6 g/dL (ref 30.0–36.0)
MCV: 98.2 fL (ref 80.0–100.0)
Monocytes Absolute: 0.5 10*3/uL (ref 0.1–1.0)
Monocytes Relative: 9 %
Neutro Abs: 3.7 10*3/uL (ref 1.7–7.7)
Neutrophils Relative %: 65 %
Platelets: 289 10*3/uL (ref 150–400)
RBC: 3.37 MIL/uL — ABNORMAL LOW (ref 3.87–5.11)
RDW: 13.7 % (ref 11.5–15.5)
WBC: 5.7 10*3/uL (ref 4.0–10.5)
nRBC: 0 % (ref 0.0–0.2)

## 2020-04-23 LAB — D-DIMER, QUANTITATIVE: D-Dimer, Quant: 3.66 ug/mL-FEU — ABNORMAL HIGH (ref 0.00–0.50)

## 2020-04-23 MED ORDER — IOHEXOL 350 MG/ML SOLN
100.0000 mL | Freq: Once | INTRAVENOUS | Status: AC | PRN
  Administered 2020-04-23: 100 mL via INTRAVENOUS

## 2020-04-23 NOTE — Progress Notes (Signed)
PROGRESS NOTE    Hannah Fischer  WUJ:[id number] DOB: 1932/11/13 DOA: 03/18/2020 PCP: Sigmund Hazel, MD   Chief Complaint  Patient presents with  . 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 Shehad gone to bedin the morning her husbandhad trouble waking her up. In the ED she was noted to be responsive to pain. A code stroke was called. She was outside the window for TPA. An emergent CT did not show any acute abnormality. A CTA was performed and did not reveal any obvious stenosis. An MRI of the brain was performed on 2/13:Punctate acute infarction in the left superior cerebellumadjacent to the superior cerebellar peduncle. Bilateral thalamicacute infarction extending back into the mid brain. This is consistent with artery of Percheron infarction.She continues to wax and wane from a mental status standpoint. She had cortrak and subsequently underwent PEG tube placement.. Palliative and neuro are following,   Assessment & Plan:   Principal Problem:   Acute encephalopathy Active Problems:   Diabetes mellitus (HCC)   Hypothyroidism   History of embolic stroke   Acute bilateral thalamic infarction (HCC)   Dysphagia   CKD (chronic kidney disease), symptom management only, stage 3 (moderate) (HCC)   Hydronephrosis   Group B streptococcal UTI   Uncontrolled type 2 diabetes mellitus with hyperglycemia, with long-term current use of insulin (HCC)   Chronic diastolic heart failure (HCC)   Essential hypertension   Physical debility   Left hemiplegia (HCC)   Palliative care by specialist   Goals of care, counseling/discussion   DNR (do not resuscitate)   Fever   Acute lower UTI   Tachypnea   Left upper extremity swelling  1 bilateral thalamic infarct/persistent acute metabolic  encephalopathy Secondary to acute CVA. -Patient noted to have some waxing and waning mental status and at times minimally responsive versus others where she is noted to talk. -Patient more alert today. -Patient initially noted to be on Provigil which was uptitrated however patient developed tremulousness and subsequently discontinued. -Patient also underwent trial of amantadine which was discontinued. -Patient with prior history of embolic infarcts imaging on admission showed punctate acute infarct in the left superior cerebellum adjacent to superior cerebellar peduncle.  Bilateral thalamic acute infarct extending back into the midbrain consistent with artery of Percheron infarction. -Patient noted to have an acute event with asymmetric pupils 2/25 underwent repeat MRI with no acute findings however expected evolution of known infarcts. -EEG done 03/19/2020 with moderate diffuse encephalopathy with no apparent underlying seizure activity. -Continue Plavix for secondary stroke prophylaxis. -Statin on hold secondary to transaminitis.  LFTs have trended down.    2.  Hypertonicity and spasticity secondary to recent brain injury from stroke -Patient noted to have significant hypertonicity lower extremities affecting working with PT and OT efficiently. -Patient also noted to have spasticity related tremors in upper extremities -Skelaxin and Robaxin held secondary to transaminitis.  LFTs trending down. -Could possibly resume Robaxin. -Continue decreased current regimen of low-dose OxyIR 2.5 mg to every 8 hours and continue to wean to every 12 hours in 1 to 2 days.  -Dr. Riley Kill, consulted via secure chat per Junious Silk, NP to assist with initiating/titrating medications for spasticity and hypertonicity.  3.  Tachypnea Patient noted to be tachypneic yesterday evening and today with respiratory rate in the 30s. -Questionable etiology.  Could be multifactorial secondary to  pain, problems #1 and 2.   Patient with sats of 100% on room air.  Chest x-ray done 04/22/2020 with no acute abnormalities.  Check a D-dimer and if elevated will get a CT angiogram chest to rule out PE as patient pretty much has been bedbound during this hospitalization although on DVT prophylaxis..  Patient also with left upper extremity pain and swelling.  Continue current pain management.  Follow.  4.  Left upper extremity pain and swelling Check a left upper extremity Doppler ultrasound to rule out DVT and plain films of the left upper extremity to rule out fracture.  Continue current pain regimen.  Morphine for breakthrough pain.  5.  Recurrent urinary retention Patient noted to have required I/O cath. -Foley catheter inserted 04/17/2020 with 950 cc of urine returning. -Concerned that urinary retention may be secondary to medications which have been discontinued. -Output of 1000 cc from Foley catheter.   -Voiding trial in 2 days.  6.  Group B strep UTI/recurrent fevers/MRSA UTI (3/15) -Patient noted to have GBS UTI from 2/12 status post treatment with IV Rocephin. -2/27 noted to have yeast UTI status post course of fluconazole -Patient noted to have fever 2/27, chest x-ray with bibasilar opacities, urine culture with yeast status post completion of fluconazole. -Blood cultures 1/2 with staph epidermis likely contamination. -Patient noted to have low-grade temp 100.2 on 3/5 and temp of 101.1 3/14 and subsequently pancultured. -Urinalysis abnormal, chest x-ray unremarkable with improvement of bibasilar atelectasis. -Fever curve trending down. -Urine cultures with 80,000 colonies of MRSA.   -Due to ongoing fevers, patient started on IV doxycycline and treat for total of 3-5 days.   7.  Dysphagia/nutrition -Patient with inadequate oral intake.   -Status post PEG tube placement 04/17/2020  -Continue tube feeds  -Dietitian following.    8.  AKI on chronic kidney disease stage IIIA/right-sided hydronephrosis -Baseline  creatinine 1-1.3. -Early on during the hospitalization patient noted to have worsening renal function renal ultrasound done consistent with a severe right-sided hydronephrosis. -Neurology/urology consulted patient status post right nephrostomy tube placement per IR 03/27/2020 -Due to urinary retention Foley catheter was placed. -Urine output 1000 cc over the past 24 hours. -Abdominal ultrasound on 04/18/2020 with resolution of previously noted right hydronephrosis on CT. -Creatinine improving and currently around baseline of 0.98. -Outpatient follow-up with urology.  9.  Diabetes mellitus type 2 Hemoglobin A1c 7.6.   -CBG 136 this morning.   -Continue current regimen of Levemir 5 units daily, NovoLog 2 units every 4 hours.  SSI.   10.  Chronic diastolic heart failure/hypertension 2D echo with grade 2 diastolic dysfunction. -Compensated.   -Continue Plavix, Norvasc.  11.  Residual left hemiplegia from prior stroke/debility/deconditioning -PT/OT.   -Needs SNF  12.  Gluteal skin breakdown -Barrier cream, Desitin -Air mattress -Frequent turns.  13.  Hypothyroidism -Continue Synthroid daily.  Outpatient follow-up.   14.  Hyperkalemia Resolved.  Potassium at 4.6.        DVT prophylaxis: Heparin Code Status: DNR Family Communication: No family at bedside. Disposition:   Status is: Inpatient    Dispo: The patient is from: Home              Anticipated d/c is to: SNF              Patient currently Patient very slow to progress with PT and OT and unclear if she is an appropriate rehab candidate for SNF or will need long-term placement.  Had PEG tube placed on  3/8.  Family has confirmed that they do not have the resources to pay for skilled nursing facility services out-of-pocket once insurance benefits exhausted.   Difficult to place patient yes       Consultants:   Interventional radiology: Dr. Grace Isaac 04/11/2020  Urology: Dr. Marlou Porch 03/25/2020  Nephrology: Dr.  Melanee Spry 03/25/2020  Palliative care: Lamarr Lulas 2, NP 03/21/2020  Neurology: Dr. Pearlean Brownie 03/18/2020  Procedures:   Right percutaneous nephrostomy 03/28/2019  CT angiogram head and neck 03/18/2020  MRI brain 03/19/2020, 03/31/2020  Gastrostomy tube placement under fluoroscopy per Dr. Deanne Coffer, interventional radiology 04/17/2020  Abdominal ultrasound 04/18/2020  Renal ultrasound 03/25/2020, 03/27/2020  CT chest abdomen and pelvis 03/25/2020  CT head 03/18/2020  MR venogram head with and without contrast 03/21/2020  Antimicrobials:  Anti-infectives (From admission, onward)   Start     Dose/Rate Route Frequency Ordered Stop   04/20/20 1000  doxycycline (VIBRAMYCIN) 100 mg in sodium chloride 0.9 % 250 mL IVPB        100 mg 125 mL/hr over 120 Minutes Intravenous Every 12 hours 04/20/20 0831 04/25/20 0959   04/17/20 0807  ceFAZolin (ANCEF) 2-4 GM/100ML-% IVPB       Note to Pharmacy: Cristy Friedlander   : cabinet override      04/17/20 0807 04/17/20 0847   04/07/20 2000  fluconazole (DIFLUCAN) 10 MG/ML suspension 100 mg        100 mg Per Tube Every evening 04/07/20 1236 04/08/20 2023   04/03/20 2000  fluconazole (DIFLUCAN) 40 MG/ML suspension 100 mg  Status:  Discontinued       "Followed by" Linked Group Details   100 mg Per Tube Every 24 hours 04/02/20 1752 04/07/20 1236   04/02/20 1900  fluconazole (DIFLUCAN) 40 MG/ML suspension 200 mg       "Followed by" Linked Group Details   200 mg Per Tube  Once 04/02/20 1752 04/02/20 2020   03/26/20 0730  cefTRIAXone (ROCEPHIN) 1 g in sodium chloride 0.9 % 100 mL IVPB        1 g 200 mL/hr over 30 Minutes Intravenous Every 24 hours 03/26/20 0644 03/30/20 0844   03/18/20 1900  azithromycin (ZITHROMAX) 500 mg in sodium chloride 0.9 % 250 mL IVPB  Status:  Discontinued        500 mg 250 mL/hr over 60 Minutes Intravenous Every 24 hours 03/18/20 1830 03/19/20 1106   03/18/20 1900  cefTRIAXone (ROCEPHIN) 2 g in sodium chloride 0.9 % 100 mL IVPB  Status:   Discontinued        2 g 200 mL/hr over 30 Minutes Intravenous Every 24 hours 03/18/20 1830 03/19/20 1106       Subjective: Patient opens eyes to verbal stimuli.  More alert today.  Monosyllables.  Left upper extremity extremely tender to palpation with swelling.  Patient complaining of pain in the left upper extremity.   Objective: Vitals:   04/22/20 2017 04/23/20 0016 04/23/20 0406 04/23/20 0740  BP: 92/80 (!) 147/77 136/60 128/73  Pulse: 76 95 80 76  Resp: 16 15 16  (!) 32  Temp: 99 F (37.2 C) 98.8 F (37.1 C) 97.9 F (36.6 C) 98.1 F (36.7 C)  TempSrc: Oral Oral Oral Oral  SpO2: 98% 95% 96% 96%  Weight:      Height:        Intake/Output Summary (Last 24 hours) at 04/23/2020 0926 Last data filed at 04/23/2020 0754 Gross per 24 hour  Intake 991.07 ml  Output 1000 ml  Net -8.93  ml   Filed Weights   04/04/20 0438 04/05/20 0500 04/08/20 0423  Weight: 81.8 kg 79.5 kg 80 kg    Examination:  General exam: Alert. Respiratory system: Some coarse breath sounds anterior lung fields.  No wheezes, no crackles, no rhonchi.  Some tachypnea. Cardiovascular system: RRR no murmurs rubs or gallops.  No JVD.  No lower extremity edema.  Gastrointestinal system: Abdomen is soft, nontender, nondistended, positive bowel sounds.  PEG tube intact. Central nervous system: Alert.  Monosyllables.  Bilateral upper extremities with splints.  Left upper extremity swelling and significant tenderness to palpation.  Improving spasticity right upper extremity.  Hypertonicity bilateral lower extremities with pain improving.   Extremities: Left upper extremity swelling and significant tenderness to palpation.  Improving spasticity right upper extremity.  Hypertonicity bilateral lower extremities with pain improving.  Skin: No rashes, lesions or ulcers Psychiatry: Judgement and insight poor to fair.  Mood & affect appropriate.     Data Reviewed: I have personally reviewed following labs and imaging  studies  CBC: Recent Labs  Lab 04/17/20 0202 04/19/20 0421 04/20/20 0417 04/22/20 0055  WBC 6.2 5.3 5.4 5.8  NEUTROABS 4.1 3.4  --   --   HGB 11.0* 10.3* 11.1* 10.9*  HCT 35.1* 31.9* 34.5* 33.4*  MCV 99.4 99.7 98.6 97.9  PLT 293 250 286 299    Basic Metabolic Panel: Recent Labs  Lab 04/17/20 0202 04/18/20 0234 04/19/20 0421 04/20/20 0417 04/22/20 0055  NA 135 136 138 137 136  K 4.9 4.8 4.0 4.5 4.6  CL 98 99 102 98 100  CO2 26 29 29 29 30   GLUCOSE 125* 169* 179* 176* 137*  BUN 60* 52* 44* 40* 40*  CREATININE 1.25* 1.26* 1.08* 1.00 0.98  CALCIUM 9.8 9.8 9.6 9.7 9.4  MG 2.3  --   --   --  1.9  PHOS 4.5  --   --   --   --     GFR: Estimated Creatinine Clearance: 42 mL/min (by C-G formula based on SCr of 0.98 mg/dL).  Liver Function Tests: Recent Labs  Lab 04/17/20 0202 04/18/20 0234 04/19/20 0421 04/20/20 0417 04/22/20 0055  AST 63* 144* 65* 34 21  ALT 64* 67* 67* 45* 30  ALKPHOS 268* 326* 287* 277* 190*  BILITOT 0.8 2.1* 0.7 0.6 0.7  PROT 6.7 6.0* 5.8* 6.2* 6.0*  ALBUMIN 2.7* 2.4* 2.2* 2.3* 2.3*    CBG: Recent Labs  Lab 04/22/20 1528 04/22/20 2017 04/23/20 0017 04/23/20 0404 04/23/20 0737  GLUCAP 190* 144* 145* 136* 121*     Recent Results (from the past 240 hour(s))  Surgical pcr screen     Status: Abnormal   Collection Time: 04/16/20  7:28 PM   Specimen: Nasal Mucosa; Nasal Swab  Result Value Ref Range Status   MRSA, PCR NEGATIVE NEGATIVE Final   Staphylococcus aureus POSITIVE (A) NEGATIVE Final    Comment: (NOTE) The Xpert SA Assay (FDA approved for NASAL specimens in patients 85 years of age and older), is one component of a comprehensive surveillance program. It is not intended to diagnose infection nor to guide or monitor treatment. Performed at [facility], [address] [place], [place] [address]   Culture, blood (routine x 2)     Status: None   Collection Time: 04/17/20  4:48 PM   Specimen: BLOOD  Result Value Ref  Range Status   Specimen Description BLOOD LEFT ANTECUBITAL  Final   Special Requests   Final  BOTTLES DRAWN AEROBIC ONLY Blood Culture adequate volume   Culture   Final    NO GROWTH 5 DAYS Performed at [facility], [address] [place], [place] [address]    Report Status 04/22/2020 FINAL  Final  Culture, blood (routine x 2)     Status: Abnormal   Collection Time: 04/17/20  4:48 PM   Specimen: BLOOD RIGHT HAND  Result Value Ref Range Status   Specimen Description BLOOD RIGHT HAND  Final   Special Requests   Final    BOTTLES DRAWN AEROBIC ONLY Blood Culture adequate volume   Culture  Setup Time   Final    AEROBIC BOTTLE ONLY GRAM POSITIVE COCCI CRITICAL RESULT CALLED TO, READ BACK BY AND VERIFIED WITH: PHARMD THOMAS J.  [id number] [phone] FCP    Culture (A)  Final    STAPHYLOCOCCUS EPIDERMIDIS THE SIGNIFICANCE OF ISOLATING THIS ORGANISM FROM A SINGLE SET OF BLOOD CULTURES WHEN MULTIPLE SETS ARE DRAWN IS UNCERTAIN. PLEASE NOTIFY THE MICROBIOLOGY DEPARTMENT WITHIN ONE WEEK IF SPECIATION AND SENSITIVITIES ARE REQUIRED. Performed at [facility], [address], [place], [place] [address]    Report Status 04/21/2020 FINAL  Final  Blood Culture ID Panel (Reflexed)     Status: Abnormal   Collection Time: 04/17/20  4:48 PM  Result Value Ref Range Status   Enterococcus faecalis NOT DETECTED NOT DETECTED Final   Enterococcus Faecium NOT DETECTED NOT DETECTED Final   Listeria monocytogenes NOT DETECTED NOT DETECTED Final   Staphylococcus species DETECTED (A) NOT DETECTED Final    Comment: CRITICAL RESULT CALLED TO, READ BACK BY AND VERIFIED WITH: PHARMD THOMAS J.  [id number] FCP    Staphylococcus aureus (BCID) NOT DETECTED NOT DETECTED Final   Staphylococcus epidermidis DETECTED (A) NOT DETECTED Final    Comment: Methicillin (oxacillin) resistant coagulase negative staphylococcus. Possible blood culture contaminant (unless isolated from more than one blood culture draw or  clinical case suggests pathogenicity). No antibiotic treatment is indicated for blood  culture contaminants. CRITICAL RESULT CALLED TO, READ BACK BY AND VERIFIED WITH: PHARMD THOMAS J.  [id number] [phone] FCP    Staphylococcus lugdunensis NOT DETECTED NOT DETECTED Final   Streptococcus species NOT DETECTED NOT DETECTED Final   Streptococcus agalactiae NOT DETECTED NOT DETECTED Final   Streptococcus pneumoniae NOT DETECTED NOT DETECTED Final   Streptococcus pyogenes NOT DETECTED NOT DETECTED Final   A.calcoaceticus-baumannii NOT DETECTED NOT DETECTED Final   Bacteroides fragilis NOT DETECTED NOT DETECTED Final   Enterobacterales NOT DETECTED NOT DETECTED Final   Enterobacter cloacae complex NOT DETECTED NOT DETECTED Final   Escherichia coli NOT DETECTED NOT DETECTED Final   Klebsiella aerogenes NOT DETECTED NOT DETECTED Final   Klebsiella oxytoca NOT DETECTED NOT DETECTED Final   Klebsiella pneumoniae NOT DETECTED NOT DETECTED Final   Proteus species NOT DETECTED NOT DETECTED Final   Salmonella species NOT DETECTED NOT DETECTED Final   Serratia marcescens NOT DETECTED NOT DETECTED Final   Haemophilus influenzae NOT DETECTED NOT DETECTED Final   Neisseria meningitidis NOT DETECTED NOT DETECTED Final   Pseudomonas aeruginosa NOT DETECTED NOT DETECTED Final   Stenotrophomonas maltophilia NOT DETECTED NOT DETECTED Final   Candida albicans NOT DETECTED NOT DETECTED Final   Candida auris NOT DETECTED NOT DETECTED Final   Candida glabrata NOT DETECTED NOT DETECTED Final   Candida krusei NOT DETECTED NOT DETECTED Final   Candida parapsilosis NOT DETECTED NOT DETECTED Final   Candida tropicalis NOT DETECTED NOT DETECTED Final  Cryptococcus neoformans/gattii NOT DETECTED NOT DETECTED Final   Methicillin resistance mecA/C DETECTED (A) NOT DETECTED Final    Comment: CRITICAL RESULT CALLED TO, READ BACK BY AND VERIFIED WITH: Marissa Nestle  [id number] [phone] FCP Performed at [facility], [address], [place], [place] [address]   Culture, Urine     Status: Abnormal   Collection Time: 04/18/20  3:57 AM   Specimen: Urine, Catheterized  Result Value Ref Range Status   Specimen Description URINE, CATHETERIZED  Final   Special Requests   Final    NONE Performed at [facility], [address], [place], [place] [address]    Culture (A)  Final    80,000 COLONIES/mL METHICILLIN RESISTANT STAPHYLOCOCCUS AUREUS   Report Status 04/20/2020 FINAL  Final   Organism ID, Bacteria METHICILLIN RESISTANT STAPHYLOCOCCUS AUREUS (A)  Final      Susceptibility   Methicillin resistant staphylococcus aureus - MIC*    CIPROFLOXACIN >=8 RESISTANT Resistant     GENTAMICIN <=0.5 SENSITIVE Sensitive     NITROFURANTOIN <=16 SENSITIVE Sensitive     OXACILLIN >=4 RESISTANT Resistant     TETRACYCLINE <=1 SENSITIVE Sensitive     VANCOMYCIN <=0.5 SENSITIVE Sensitive     TRIMETH/SULFA <=10 SENSITIVE Sensitive     CLINDAMYCIN <=0.25 SENSITIVE Sensitive     RIFAMPIN <=0.5 SENSITIVE Sensitive     Inducible Clindamycin NEGATIVE Sensitive     * 80,000 COLONIES/mL METHICILLIN RESISTANT STAPHYLOCOCCUS AUREUS         Radiology Studies: DG CHEST PORT 1 VIEW  Result Date: 04/22/2020 CLINICAL DATA:  Tachypnea EXAM: PORTABLE CHEST 1 VIEW COMPARISON:  04/17/2020 FINDINGS: Bilateral shoulder replacements. Mild cardiomegaly. No confluent opacities or effusions. No acute bony abnormality. IMPRESSION: Mild cardiomegaly.  No active disease. Electronically Signed   By: Charlett Nose M.D.   On: 04/22/2020 16:23        Scheduled Meds: . amLODipine  5 mg Per Tube Daily  . chlorhexidine  15 mL Mouth Rinse BID  . Chlorhexidine Gluconate Cloth  6 each Topical Daily  . clopidogrel  75 mg Per Tube Daily  . feeding supplement (PROSource TF)  45 mL Per Tube TID  . free water  200 mL Per Tube Q6H  . heparin  5,000 Units Subcutaneous Q8H  . insulin aspart  0-9 Units Subcutaneous Q4H  . insulin aspart  2 Units  Subcutaneous Q4H  . insulin detemir  5 Units Subcutaneous Daily  . levothyroxine  112 mcg Per Tube QAC breakfast  . liver oil-zinc oxide   Topical BID  . mouth rinse  15 mL Mouth Rinse q12n4p  . modafinil  200 mg Per Tube Daily  . mupirocin ointment  1 application Nasal BID  . oxyCODONE  2.5 mg Per Tube Q8H  . sodium chloride flush  5 mL Intracatheter Q8H   Continuous Infusions: . sodium chloride Stopped (04/23/20 0049)  . doxycycline (VIBRAMYCIN) IV Stopped (04/23/20 0003)  . feeding supplement (GLUCERNA 1.2 CAL) 1,000 mL (04/22/20 1754)     LOS: 36 days    Time spent: 40 minutes    Ramiro Harvest, MD [facility]   To contact the attending provider between 7A-7P or the covering provider during after hours 7P-7A, please log into the web site [email or web address] and access using universal [employer] password for that web site. If you do not have the password, please call the hospital operator.  04/23/2020, 9:26 AM

## 2020-04-23 NOTE — Progress Notes (Signed)
VASCULAR LAB    Left upper extremity venous duplex has been performed.  See CV proc for preliminary results.  Gave verbal results to Marge, RN, and to Dr. Janee Morn via secure chat.  Mekhi Lascola, RVT 04/23/2020, 12:53 PM

## 2020-04-24 DIAGNOSIS — I639 Cerebral infarction, unspecified: Secondary | ICD-10-CM | POA: Diagnosis not present

## 2020-04-24 DIAGNOSIS — N179 Acute kidney failure, unspecified: Secondary | ICD-10-CM | POA: Diagnosis not present

## 2020-04-24 DIAGNOSIS — G9340 Encephalopathy, unspecified: Secondary | ICD-10-CM | POA: Diagnosis not present

## 2020-04-24 DIAGNOSIS — I5032 Chronic diastolic (congestive) heart failure: Secondary | ICD-10-CM | POA: Diagnosis not present

## 2020-04-24 DIAGNOSIS — I82890 Acute embolism and thrombosis of other specified veins: Secondary | ICD-10-CM

## 2020-04-24 LAB — GLUCOSE, CAPILLARY
Glucose-Capillary: 180 mg/dL — ABNORMAL HIGH (ref 70–99)
Glucose-Capillary: 121 mg/dL — ABNORMAL HIGH (ref 70–99)
Glucose-Capillary: 94 mg/dL (ref 70–99)
Glucose-Capillary: 146 mg/dL — ABNORMAL HIGH (ref 70–99)
Glucose-Capillary: 192 mg/dL — ABNORMAL HIGH (ref 70–99)
Glucose-Capillary: 141 mg/dL — ABNORMAL HIGH (ref 70–99)

## 2020-04-24 LAB — CBC
HCT: 32.9 % — ABNORMAL LOW (ref 36.0–46.0)
Hemoglobin: 10.3 g/dL — ABNORMAL LOW (ref 12.0–15.0)
MCH: 31.1 pg (ref 26.0–34.0)
MCHC: 31.3 g/dL (ref 30.0–36.0)
MCV: 99.4 fL (ref 80.0–100.0)
Platelets: 267 10*3/uL (ref 150–400)
RBC: 3.31 MIL/uL — ABNORMAL LOW (ref 3.87–5.11)
RDW: 13.7 % (ref 11.5–15.5)
WBC: 5.8 10*3/uL (ref 4.0–10.5)
nRBC: 0 % (ref 0.0–0.2)

## 2020-04-24 LAB — COMPREHENSIVE METABOLIC PANEL
ALT: 20 U/L (ref 0–44)
AST: 19 U/L (ref 15–41)
Albumin: 2.1 g/dL — ABNORMAL LOW (ref 3.5–5.0)
Alkaline Phosphatase: 144 U/L — ABNORMAL HIGH (ref 38–126)
Anion gap: 9 (ref 5–15)
BUN: 35 mg/dL — ABNORMAL HIGH (ref 8–23)
CO2: 28 mmol/L (ref 22–32)
Calcium: 9.4 mg/dL (ref 8.9–10.3)
Chloride: 98 mmol/L (ref 98–111)
Creatinine, Ser: 0.89 mg/dL (ref 0.44–1.00)
GFR, Estimated: >60 mL/min (ref >60)
Glucose, Bld: 178 mg/dL — ABNORMAL HIGH (ref 70–99)
Potassium: 4.4 mmol/L (ref 3.5–5.1)
Sodium: 135 mmol/L (ref 135–145)
Total Bilirubin: 0.7 mg/dL (ref 0.3–1.2)
Total Protein: 5.6 g/dL — ABNORMAL LOW (ref 6.5–8.1)

## 2020-04-24 MED ORDER — ROSUVASTATIN CALCIUM 5 MG PO TABS
2.5000 mg | ORAL_TABLET | Freq: Every day | ORAL | Status: DC
  Administered 2020-04-24 – 2020-04-30 (×6): 2.5 mg
  Filled 2020-04-24 (×6): qty 1

## 2020-04-24 MED ORDER — METHOCARBAMOL 500 MG PO TABS
500.0000 mg | ORAL_TABLET | Freq: Two times a day (BID) | ORAL | Status: DC
  Administered 2020-04-24 – 2020-04-25 (×4): 500 mg
  Filled 2020-04-24 (×5): qty 1

## 2020-04-24 NOTE — Progress Notes (Signed)
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 Shehad gone to bedin the morning her husbandhad trouble waking her up. In the ED she was noted to be responsive to pain. A code stroke was called. She was outside the window for TPA. An emergent CT did not show any acute abnormality. A CTA was performed and did not reveal any obvious stenosis. An MRI of the brain was performed on 2/13:Punctate acute infarction in the left superior cerebellumadjacent to the superior cerebellar peduncle. Bilateral thalamicacute infarction extending back into the mid brain. This is consistent with artery of Percheron infarction.She continues to wax and wane from a mental status standpoint. She had cortrak and subsequently underwent PEG tube placement.. Palliative and neuro are following,   Assessment & Plan:   Principal Problem:   Acute encephalopathy Active Problems:   Diabetes mellitus (HCC)   Hypothyroidism   History of embolic stroke   Acute bilateral thalamic infarction (HCC)   Dysphagia   CKD (chronic kidney disease), symptom management only, stage 3 (moderate) (HCC)   Hydronephrosis   Group B streptococcal UTI   Uncontrolled type 2 diabetes mellitus with hyperglycemia, with long-term current use of insulin (HCC)   Chronic diastolic heart failure (HCC)   Essential hypertension   Physical debility   Left hemiplegia (HCC)   Palliative care by specialist   Goals of care, counseling/discussion   DNR (do not resuscitate)   Fever   Acute lower UTI   Tachypnea   Left upper extremity swelling  1 bilateral thalamic infarct/persistent acute metabolic  encephalopathy Secondary to acute CVA. -Patient noted to have some waxing and waning mental status and at times minimally responsive versus others where she is noted to talk. -Patient sleeping heavily today.  Patient noted to be alert yesterday and interacting.. -Patient initially noted to be on Provigil which was uptitrated however patient developed tremulousness and subsequently discontinued. -Patient also underwent trial of amantadine which was discontinued. -Patient with prior history of embolic infarcts imaging on admission showed punctate acute infarct in the left superior cerebellum adjacent to superior cerebellar peduncle.  Bilateral thalamic acute infarct extending back into the midbrain consistent with artery of Percheron infarction. -Patient noted to have an acute event with asymmetric pupils 2/25 underwent repeat MRI with no acute findings however expected evolution of known infarcts. -EEG done 03/19/2020 with moderate diffuse encephalopathy with no apparent underlying seizure activity. -Continue Plavix for secondary stroke prophylaxis. -Statin on hold secondary to transaminitis.  LFTs have trended down.    2.  Hypertonicity and spasticity secondary to recent brain injury from stroke -Patient noted to have significant hypertonicity lower extremities affecting working with PT and OT efficiently. -Patient also noted to have spasticity related tremors in upper extremities -Skelaxin and Robaxin held secondary to transaminitis.  LFTs trending down. -Could possibly resume Robaxin. -Continue decreased current regimen of low-dose OxyIR 2.5 mg to every 8 hours and continue to wean to every 12 hours tomorrow.  -Dr. Riley Kill, consulted via secure chat per Junious Silk, NP to assist with initiating/titrating medications for spasticity and hypertonicity.  3.  Tachypnea Patient noted to be tachypneic on 3/19-20, with respiratory rate in the 30s which has since improved  -  Questionable etiology.   Could be multifactorial secondary to pain, left upper extremity superficial vein thrombosis, problems #1 and 2.  Patient with sats of 100% on room air.   -Clinical improvement.   -Chest x-ray done 04/22/2020 with no acute abnormalities.   -D-dimer elevated and as such CT angiogram chest was obtained which was negative for PE.   -Left upper extremity swelling improved.   -Continue current pain management.   4.  Left upper extremity pain and swelling/superficial vein thrombosis Upper extremity Doppler consistent with superficial vein thrombosis likely secondary to IV.   -IV has been discontinued.   -Left upper extremity swelling and pain improved.   -Keep left upper extremity elevated, warm compresses.   -Patient on heparin for DVT prophylaxis as well as Plavix.   -Supportive care.   5.  Recurrent urinary retention Patient noted to have required I/O cath. -Foley catheter inserted 04/17/2020 with 950 cc of urine returning. -Concerned that urinary retention may be secondary to medications which have been discontinued. -Output of 1650 cc from Foley catheter.   -Voiding trial in 1 days.  6.  Group B strep UTI/recurrent fevers/MRSA UTI (3/15) -Patient noted to have GBS UTI from 2/12 status post treatment with IV Rocephin. -2/27 noted to have yeast UTI status post course of fluconazole -Patient noted to have fever 2/27, chest x-ray with bibasilar opacities, urine culture with yeast status post completion of fluconazole. -Blood cultures 1/2 with staph epidermis likely contamination. -Patient noted to have low-grade temp 100.2 on 3/5 and temp of 101.1 3/14 and subsequently pancultured. -Urinalysis abnormal, chest x-ray unremarkable with improvement of bibasilar atelectasis. -Fever curve trending down. -Urine cultures with 80,000 colonies of MRSA.   -Due to ongoing fevers, patient started on IV doxycycline and treat for total of 3-5 days.   7.  Dysphagia/nutrition -Patient with inadequate oral  intake.   -Status post PEG tube placement 04/17/2020  -TF. -Dietitian following.    8.  AKI on chronic kidney disease stage IIIA/right-sided hydronephrosis -Baseline creatinine 1-1.3. -Early on during the hospitalization patient noted to have worsening renal function renal ultrasound done consistent with a severe right-sided hydronephrosis. -Neurology/urology consulted patient status post right nephrostomy tube placement per IR 03/27/2020 -Due to urinary retention Foley catheter was placed. -Urine output 1.650 cc over the past 24 hours. -Abdominal ultrasound on 04/18/2020 with resolution of previously noted right hydronephrosis on CT. -Renal function improved.  Creatinine is 0.89.   -Outpatient follow-up with urology.  9.  Diabetes mellitus type 2 Hemoglobin A1c 7.6.   -CBG 121 this morning.  -Continue Levemir 5 units daily, NovoLog 2 units every 4 hours.  SSI.  10.  Chronic diastolic heart failure/hypertension 2D echo with grade 2 diastolic dysfunction. -Compensated.   -Continue Norvasc, Plavix.   11.  Residual left hemiplegia from prior stroke/debility/deconditioning -PT/OT.   -Needs SNF  12.  Gluteal skin breakdown -Barrier cream, Desitin -Air mattress -Frequent turns.  13.  Hypothyroidism -Synthroid.   14.  Hyperkalemia Resolved.  Potassium at 4.4.        DVT prophylaxis: Heparin Code Status: DNR Family Communication: No family at bedside. Disposition:   Status is: Inpatient    Dispo: The patient is from: Home              Anticipated d/c is to: SNF              Patient currently Patient very slow to progress with PT and OT and unclear if she is an appropriate rehab candidate  for SNF or will need long-term placement.  Had PEG tube placed on 3/8.  Family has confirmed that they do not have the resources to pay for skilled nursing facility services out-of-pocket once insurance benefits exhausted.   Difficult to place patient yes       Consultants:    Interventional radiology: Dr. Grace Isaac 04/11/2020  Urology: Dr. Marlou Porch 03/25/2020  Nephrology: Dr. Melanee Spry 03/25/2020  Palliative care: Lamarr Lulas 2, NP 03/21/2020  Neurology: Dr. Pearlean Brownie 03/18/2020  Procedures:   Right percutaneous nephrostomy 03/28/2019  CT angiogram head and neck 03/18/2020  MRI brain 03/19/2020, 03/31/2020  Gastrostomy tube placement under fluoroscopy per Dr. Deanne Coffer, interventional radiology 04/17/2020  Abdominal ultrasound 04/18/2020  Renal ultrasound 03/25/2020, 03/27/2020  CT chest abdomen and pelvis 03/25/2020  CT head 03/18/2020  MR venogram head with and without contrast 03/21/2020  CT angiogram chest 04/23/2020  Left upper extremity Dopplers 04/23/2020  Antimicrobials:  Anti-infectives (From admission, onward)   Start     Dose/Rate Route Frequency Ordered Stop   04/20/20 1000  doxycycline (VIBRAMYCIN) 100 mg in sodium chloride 0.9 % 250 mL IVPB        100 mg 125 mL/hr over 120 Minutes Intravenous Every 12 hours 04/20/20 0831 04/25/20 0959   04/17/20 0807  ceFAZolin (ANCEF) 2-4 GM/100ML-% IVPB       Note to Pharmacy: Cristy Friedlander   : cabinet override      04/17/20 0807 04/17/20 0847   04/07/20 2000  fluconazole (DIFLUCAN) 10 MG/ML suspension 100 mg        100 mg Per Tube Every evening 04/07/20 1236 04/08/20 2023   04/03/20 2000  fluconazole (DIFLUCAN) 40 MG/ML suspension 100 mg  Status:  Discontinued       "Followed by" Linked Group Details   100 mg Per Tube Every 24 hours 04/02/20 1752 04/07/20 1236   04/02/20 1900  fluconazole (DIFLUCAN) 40 MG/ML suspension 200 mg       "Followed by" Linked Group Details   200 mg Per Tube  Once 04/02/20 1752 04/02/20 2020   03/26/20 0730  cefTRIAXone (ROCEPHIN) 1 g in sodium chloride 0.9 % 100 mL IVPB        1 g 200 mL/hr over 30 Minutes Intravenous Every 24 hours 03/26/20 0644 03/30/20 0844   03/18/20 1900  azithromycin (ZITHROMAX) 500 mg in sodium chloride 0.9 % 250 mL IVPB  Status:  Discontinued         500 mg 250 mL/hr over 60 Minutes Intravenous Every 24 hours 03/18/20 1830 03/19/20 1106   03/18/20 1900  cefTRIAXone (ROCEPHIN) 2 g in sodium chloride 0.9 % 100 mL IVPB  Status:  Discontinued        2 g 200 mL/hr over 30 Minutes Intravenous Every 24 hours 03/18/20 1830 03/19/20 1106       Subjective: Patient sleeping heavily.   Objective: Vitals:   04/23/20 2316 04/24/20 0417 04/24/20 0734 04/24/20 1117  BP: (!) 121/58 136/71 127/61 (!) 160/73  Pulse: 84 82 69 88  Resp: Temp: 99 F (37.2 C) 98.4 F (36.9 C) 98.6 F (37 C) 98.5 F (36.9 C)  TempSrc: Oral Oral Oral Axillary  SpO2: 99% 95% 94% 98%  Weight:      Height:        Intake/Output Summary (Last 24 hours) at 04/24/2020 1217 Last data filed at 04/24/2020 1119 Gross per 24 hour  Intake --  Output 2100 ml  Net -2100 ml   [employer]  04/04/20 0438 04/05/20 0500 04/08/20 0423  Weight: 81.8 kg 79.5 kg 80 kg    Examination:  General exam: Sleeping heavily. Respiratory system: CTA B anterior lung fields.  No wheezes, no crackles, no rhonchi.  Normal respiratory effort. Cardiovascular system: Regular rate rhythm no murmurs rubs or gallops.  No JVD.  No lower extremity edema. Gastrointestinal system: Abdomen is soft, nontender, nondistended, positive bowel sounds.  PEG tube intact.  Central nervous system: Sleeping deeply..  Bilateral upper extremities with splints.  Left upper extremity with improved swelling, less tender to palpation.   Improving spasticity right upper extremity. Hypertonicity bilateral lower extremities with pain improving.   Extremities: Left upper extremity with decreased swelling and less tenderness to palpation.  Improving spasticity right upper extremity.  Hypertonicity bilateral lower extremities with pain improving.  Skin: No rashes, lesions or ulcers Psychiatry: Judgement and insight poor to fair.  Mood & affect appropriate.     Data Reviewed: I have personally reviewed  following labs and imaging studies  CBC: Recent Labs  Lab 04/19/20 0421 04/20/20 0417 04/22/20 0055 04/23/20 0845 04/24/20 0458  WBC 5.3 5.4 5.8 5.7 5.8  NEUTROABS 3.4  --   --  3.7  --   HGB 10.3* 11.1* 10.9* 10.8* 10.3*  HCT 31.9* 34.5* 33.4* 33.1* 32.9*  MCV 99.7 98.6 97.9 98.2 99.4  PLT 250 286 299 289 267    Basic Metabolic Panel: Recent Labs  Lab 04/19/20 0421 04/20/20 0417 04/22/20 0055 04/23/20 0845 04/24/20 0458  NA 138 137 136 136 135  K 4.0 4.5 4.6 4.2 4.4  CL 102 98 100 98 98  CO2 29 29 30 29 28   GLUCOSE 179* 176* 137* 139* 178*  BUN 44* 40* 40* 36* 35*  CREATININE 1.08* 1.00 0.98 0.86 0.89  CALCIUM 9.6 9.7 9.4 9.4 9.4  MG  --   --  1.9 2.0  --     GFR: Estimated Creatinine Clearance: 46.3 mL/min (by C-G formula based on SCr of 0.89 mg/dL).  Liver Function Tests: Recent Labs  Lab 04/18/20 0234 04/19/20 0421 04/20/20 0417 04/22/20 0055 04/24/20 0458  AST 144* 65* 34 21 19  ALT 67* 67* 45* 30 20  ALKPHOS 326* 287* 277* 190* 144*  BILITOT 2.1* 0.7 0.6 0.7 0.7  PROT 6.0* 5.8* 6.2* 6.0* 5.6*  ALBUMIN 2.4* 2.2* 2.3* 2.3* 2.1*    CBG: Recent Labs  Lab 04/23/20 1959 04/23/20 2342 04/24/20 0403 04/24/20 0753 04/24/20 1128  GLUCAP 189* 112* 180* 121* 94     Recent Results (from the past 240 hour(s))  Surgical pcr screen     Status: Abnormal   Collection Time: 04/16/20  7:28 PM   Specimen: Nasal Mucosa; Nasal Swab  Result Value Ref Range Status   MRSA, PCR NEGATIVE NEGATIVE Final   Staphylococcus aureus POSITIVE (A) NEGATIVE Final    Comment: (NOTE) The Xpert SA Assay (FDA approved for NASAL specimens in patients 99 years of age and older), is one component of a comprehensive surveillance program. It is not intended to diagnose infection nor to guide or monitor treatment. Performed at [facility], [address] [place], [place]   Culture, blood (routine x 2)     Status: None   Collection Time: 04/17/20  4:48 PM    Specimen: BLOOD  Result Value Ref Range Status   Specimen Description BLOOD LEFT ANTECUBITAL  Final   Special Requests   Final    BOTTLES DRAWN AEROBIC ONLY Blood Culture adequate  volume   Culture   Final    NO GROWTH 5 DAYS Performed at [facility], [address] [place], [place] [address]    Report Status 04/22/2020 FINAL  Final  Culture, blood (routine x 2)     Status: Abnormal   Collection Time: 04/17/20  4:48 PM   Specimen: BLOOD RIGHT HAND  Result Value Ref Range Status   Specimen Description BLOOD RIGHT HAND  Final   Special Requests   Final    BOTTLES DRAWN AEROBIC ONLY Blood Culture adequate volume   Culture  Setup Time   Final    AEROBIC BOTTLE ONLY GRAM POSITIVE COCCI CRITICAL RESULT CALLED TO, READ BACK BY AND VERIFIED WITH: PHARMD THOMAS J.  [id number] [phone] FCP    Culture (A)  Final    STAPHYLOCOCCUS EPIDERMIDIS THE SIGNIFICANCE OF ISOLATING THIS ORGANISM FROM A SINGLE SET OF BLOOD CULTURES WHEN MULTIPLE SETS ARE DRAWN IS UNCERTAIN. PLEASE NOTIFY THE MICROBIOLOGY DEPARTMENT WITHIN ONE WEEK IF SPECIATION AND SENSITIVITIES ARE REQUIRED. Performed at [facility], [address] [place], [place] [address]    Report Status 04/21/2020 FINAL  Final  Blood Culture ID Panel (Reflexed)     Status: Abnormal   Collection Time: 04/17/20  4:48 PM  Result Value Ref Range Status   Enterococcus faecalis NOT DETECTED NOT DETECTED Final   Enterococcus Faecium NOT DETECTED NOT DETECTED Final   Listeria monocytogenes NOT DETECTED NOT DETECTED Final   Staphylococcus species DETECTED (A) NOT DETECTED Final    Comment: CRITICAL RESULT CALLED TO, READ BACK BY AND VERIFIED WITH: PHARMD THOMAS J.  [id number] FCP    Staphylococcus aureus (BCID) NOT DETECTED NOT DETECTED Final   Staphylococcus epidermidis DETECTED (A) NOT DETECTED Final    Comment: Methicillin (oxacillin) resistant coagulase negative staphylococcus. Possible blood culture contaminant (unless isolated from more  than one blood culture draw or clinical case suggests pathogenicity). No antibiotic treatment is indicated for blood  culture contaminants. CRITICAL RESULT CALLED TO, READ BACK BY AND VERIFIED WITH: PHARMD THOMAS J.  [id number] [phone] FCP    Staphylococcus lugdunensis NOT DETECTED NOT DETECTED Final   Streptococcus species NOT DETECTED NOT DETECTED Final   Streptococcus agalactiae NOT DETECTED NOT DETECTED Final   Streptococcus pneumoniae NOT DETECTED NOT DETECTED Final   Streptococcus pyogenes NOT DETECTED NOT DETECTED Final   A.calcoaceticus-baumannii NOT DETECTED NOT DETECTED Final   Bacteroides fragilis NOT DETECTED NOT DETECTED Final   Enterobacterales NOT DETECTED NOT DETECTED Final   Enterobacter cloacae complex NOT DETECTED NOT DETECTED Final   Escherichia coli NOT DETECTED NOT DETECTED Final   Klebsiella aerogenes NOT DETECTED NOT DETECTED Final   Klebsiella oxytoca NOT DETECTED NOT DETECTED Final   Klebsiella pneumoniae NOT DETECTED NOT DETECTED Final   Proteus species NOT DETECTED NOT DETECTED Final   Salmonella species NOT DETECTED NOT DETECTED Final   Serratia marcescens NOT DETECTED NOT DETECTED Final   Haemophilus influenzae NOT DETECTED NOT DETECTED Final   Neisseria meningitidis NOT DETECTED NOT DETECTED Final   Pseudomonas aeruginosa NOT DETECTED NOT DETECTED Final   Stenotrophomonas maltophilia NOT DETECTED NOT DETECTED Final   Candida albicans NOT DETECTED NOT DETECTED Final   Candida auris NOT DETECTED NOT DETECTED Final   Candida glabrata NOT DETECTED NOT DETECTED Final   Candida krusei NOT DETECTED NOT DETECTED Final   Candida parapsilosis NOT DETECTED NOT DETECTED Final   Candida tropicalis NOT DETECTED NOT DETECTED Final   Cryptococcus neoformans/gattii NOT DETECTED NOT DETECTED Final  Methicillin resistance mecA/C DETECTED (A) NOT DETECTED Final    Comment: CRITICAL RESULT CALLED TO, READ BACK BY AND VERIFIED WITH: Marissa NestleHARMD THOMAS J.  [phone] FCP Performed at  [facility], [address] [place], [place] [address]   Culture, Urine     Status: Abnormal   Collection Time: 04/18/20  3:57 AM   Specimen: Urine, Catheterized  Result Value Ref Range Status   Specimen Description URINE, CATHETERIZED  Final   Special Requests   Final    NONE Performed at [facility], [address] [place], [place] [address]    Culture (A)  Final    80,000 COLONIES/mL METHICILLIN RESISTANT STAPHYLOCOCCUS AUREUS   Report Status 04/20/2020 FINAL  Final   Organism ID, Bacteria METHICILLIN RESISTANT STAPHYLOCOCCUS AUREUS (A)  Final      Susceptibility   Methicillin resistant staphylococcus aureus - MIC*    CIPROFLOXACIN >=8 RESISTANT Resistant     GENTAMICIN <=0.5 SENSITIVE Sensitive     NITROFURANTOIN <=16 SENSITIVE Sensitive     OXACILLIN >=4 RESISTANT Resistant     TETRACYCLINE <=1 SENSITIVE Sensitive     VANCOMYCIN <=0.5 SENSITIVE Sensitive     TRIMETH/SULFA <=10 SENSITIVE Sensitive     CLINDAMYCIN <=0.25 SENSITIVE Sensitive     RIFAMPIN <=0.5 SENSITIVE Sensitive     Inducible Clindamycin NEGATIVE Sensitive     * 80,000 COLONIES/mL METHICILLIN RESISTANT STAPHYLOCOCCUS AUREUS         Radiology Studies: DG Forearm Left  Result Date: 04/23/2020 CLINICAL DATA:  Left forearm pain. EXAM: LEFT FOREARM - 2 VIEW COMPARISON:  None. FINDINGS: There is no evidence of fracture or other focal bone lesions. Soft tissues are unremarkable. IMPRESSION: Negative. Electronically Signed   By: Romona Curlsyler  Litton M.D.   On: 04/23/2020 15:14   CT ANGIO CHEST PE W OR WO CONTRAST  Result Date: 04/23/2020 CLINICAL DATA:  Increased lethargy. Artery of Percheron infarction. Tachypnea. EXAM: CT ANGIOGRAPHY CHEST WITH CONTRAST TECHNIQUE: Multidetector CT imaging of the chest was performed using the standard protocol during bolus administration of intravenous contrast. Multiplanar CT image reconstructions and MIPs were obtained to evaluate the vascular anatomy. CONTRAST:  100mL  OMNIPAQUE IOHEXOL 350 MG/ML SOLN COMPARISON:  Chest radiograph 04/22/2020 and CT chest from 03/25/2020 FINDINGS: Despite efforts by the technologist and patient, motion artifact is present on today's exam and could not be eliminated. This reduces exam sensitivity and specificity. Streak artifact from the patient's bilateral humeral implants also reduces to signal to noise ratio in a way that adversely impacts diagnostic sensitivity and specificity. Cardiovascular: No filling defect is identified in the pulmonary arterial tree to suggest pulmonary embolus. Coronary, aortic arch, and branch vessel atherosclerotic vascular disease. Mitral valve calcification. Mild aortic valve calcification. Mediastinum/Nodes: Unremarkable Lungs/Pleura: Mild dependent airspace opacity in the posterior basal segment right lower lobe probably from atelectasis. Linear subsegmental atelectasis or scarring in the posterior basal segment left lower lobe. Upper Abdomen: Unremarkable Musculoskeletal: Right proximal humeral prosthesis. Reverse left total shoulder prosthesis. Mild thoracic kyphosis. Thoracic spondylosis. Review of the MIP images confirms the above findings. IMPRESSION: 1. No filling defect is identified in the pulmonary arterial tree to suggest pulmonary embolus. 2. Coronary, aortic arch, and branch vessel atherosclerotic vascular disease. Mild aortic valve calcification. Mitral valve calcification. 3. Mild dependent airspace opacity in the posterior basal segment right lower lobe probably from atelectasis. 4. Linear subsegmental atelectasis or scarring in the posterior basal segment left lower lobe. 5. Aortic atherosclerosis. Aortic Atherosclerosis (ICD10-I70.0). Electronically Signed  By: Gaylyn Rong M.D.   On: 04/23/2020 14:55   DG CHEST PORT 1 VIEW  Result Date: 04/22/2020 CLINICAL DATA:  Tachypnea EXAM: PORTABLE CHEST 1 VIEW COMPARISON:  04/17/2020 FINDINGS: Bilateral shoulder replacements. Mild cardiomegaly. No  confluent opacities or effusions. No acute bony abnormality. IMPRESSION: Mild cardiomegaly.  No active disease. Electronically Signed   By: Charlett Nose M.D.   On: 04/22/2020 16:23   DG Shoulder Left  Result Date: 04/23/2020 CLINICAL DATA:  Left shoulder pain. EXAM: LEFT SHOULDER - 2+ VIEW COMPARISON:  None. FINDINGS: The patient is status post a left shoulder arthroplasty. There is no evidence of fracture or dislocation. Soft tissues are unremarkable. IMPRESSION: No acute osseous injury. Electronically Signed   By: Romona Curls M.D.   On: 04/23/2020 15:15   DG Humerus Left  Result Date: 04/23/2020 CLINICAL DATA:  Pain in the left arm. EXAM: LEFT HUMERUS - 2+ VIEW COMPARISON:  None. FINDINGS: The patient is status post a left shoulder arthroplasty. There is no evidence of fracture or other focal bone lesions. Soft tissues are unremarkable. IMPRESSION: Negative. Electronically Signed   By: Romona Curls M.D.   On: 04/23/2020 15:14   VAS [place] UPPER EXTREMITY VENOUS DUPLEX  Result Date: 04/24/2020 UPPER VENOUS STUDY  Indications: Pain, and Swelling Limitations: Body habitus and edema, inability to flex arm. Comparison Study: No prior study Performing Technologist: Sherren Kerns RVS  Examination Guidelines: A complete evaluation includes B-mode imaging, spectral Doppler, color Doppler, and power Doppler as needed of all accessible portions of each vessel. Bilateral testing is considered an integral part of a complete examination. Limited examinations for reoccurring indications may be performed as noted.  Right Findings: +----------+------------+---------+-----------+----------+-------+  RIGHT      Compressible Phasicity Spontaneous Properties Summary  +----------+------------+---------+-----------+----------+-------+  Subclavian                 Yes        Yes                         +----------+------------+---------+-----------+----------+-------+  Left Findings:  +----------+------------+---------+-----------+----------+--------------+  LEFT       Compressible Phasicity Spontaneous Properties    Summary      +----------+------------+---------+-----------+----------+--------------+  IJV            Full        Yes        Yes                                +----------+------------+---------+-----------+----------+--------------+  Subclavian     Full        Yes        Yes                                +----------+------------+---------+-----------+----------+--------------+  Axillary       Full        Yes        Yes                                +----------+------------+---------+-----------+----------+--------------+  Brachial       Full        Yes        Yes                                +----------+------------+---------+-----------+----------+--------------+  Radial         Full                                                      +----------+------------+---------+-----------+----------+--------------+  Ulnar                                                    Not visualized  +----------+------------+---------+-----------+----------+--------------+  Cephalic       None                   No                     Acute       +----------+------------+---------+-----------+----------+--------------+  Basilic        Full                                                      +----------+------------+---------+-----------+----------+--------------+  Summary:  Right: No evidence of thrombosis in the subclavian.  Left: No evidence of deep vein thrombosis in the upper extremity. Findings consistent with acute superficial vein thrombosis involving the left cephalic vein.  *See table(s) above for measurements and observations.  Diagnosing physician: Lemar Livings MD Electronically signed by Lemar Livings MD on 04/24/2020 at 11:19:08 AM.    Final         Scheduled Meds:  amLODipine  5 mg Per Tube Daily   chlorhexidine  15 mL Mouth Rinse BID   Chlorhexidine Gluconate Cloth  6  each Topical Daily   clopidogrel  75 mg Per Tube Daily   feeding supplement (PROSource TF)  45 mL Per Tube TID   free water  200 mL Per Tube Q6H   heparin  5,000 Units Subcutaneous Q8H   insulin aspart  0-9 Units Subcutaneous Q4H   insulin aspart  2 Units Subcutaneous Q4H   insulin detemir  5 Units Subcutaneous Daily   levothyroxine  112 mcg Per Tube QAC breakfast   liver oil-zinc oxide   Topical BID   mouth rinse  15 mL Mouth Rinse q12n4p   methocarbamol  500 mg Per Tube BID   modafinil  200 mg Per Tube Daily   oxyCODONE  2.5 mg Per Tube Q8H   rosuvastatin  2.5 mg Per Tube q1800   sodium chloride flush  5 mL Intracatheter Q8H   Continuous Infusions:  sodium chloride Stopped (04/23/20 0049)   doxycycline (VIBRAMYCIN) IV 100 mg (04/24/20 1127)   feeding supplement (GLUCERNA 1.2 CAL) 1,000 mL (04/23/20 1856)     LOS: 37 days    Time spent: 35 minutes    Ramiro Harvest, MD [facility]   To contact the attending provider between 7A-7P or the covering provider during after hours 7P-7A, please log into the web site [email or web address] and access using universal [place] password for that web site. If you do not have the password, please call the hospital operator.  04/24/2020, 12:17 PM

## 2020-04-24 NOTE — Plan of Care (Signed)
Poor arousal. Signing off

## 2020-04-24 NOTE — Progress Notes (Signed)
[facility] PROGRESS NOTE  KENIKA SAHM FKC:[id number] DOB: 12-27-1932 DOA: 03/18/2020 PCP: Sigmund Hazel, MD  Status: Remains inpatient appropriate because:Altered mental status, Unsafe d/c plan and Inpatient level of care appropriate due to severity of illness   Dispo: The patient is from: Home              Anticipated d/c is to: SNF              Patient currently is not medically stable to d/c.  Patient continues to require close monitoring of transaminases while we are adjusting her baclofen and other antispasmodic agents.              Barriers to discharge: Patient very slow to progress with PT and OT and unclear if she is an appropriate rehab candidate for SNF or will need long-term placement.  Had PEG tube placed on 3/8.  Family has confirmed that they do not have the resources to pay for skilled nursing facility services out-of-pocket once insurance benefits exhausted.   Difficult to place patient Yes   Level of care: Progressive  Code Status: DNR Family Communication: 3/14 husband at bedside.   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 Shehad gone to bedin the morning her husbandhad trouble waking her up. In the ED she was noted to be responsive to pain. A code stroke was called. She was outside the window for TPA. An emergent CT did not show any acute abnormality. A CTA was performed and did not reveal any obvious stenosis. An MRI of the brain was performed on 2/13:Punctate acute infarction in the left superior cerebellumadjacent to the superior cerebellar peduncle. Bilateral thalamicacute infarction extending back into the mid brain. This is consistent with artery of Percheron infarction.She continues to wax and wane  from a mental status standpoint. She has cortrak. Palliative and neuro are following, family eventually decided for PEG which is now pending Plavix washout  Subjective: Appears to be sleeping and or lethargic.  Patient did briefly awaken without opening eyes and appeared uncomfortable when the left arm placed under pillow to elevate.  Objective: Vitals:   04/24/20 0417 04/24/20 0734  BP: 136/71 127/61  Pulse: 82 69  Resp:  16  Temp: 98.4 F (36.9 C) 98.6 F (37 C)  SpO2: 95% 94%    Intake/Output Summary (Last 24 hours) at 04/24/2020 0802 Last data filed at 04/24/2020 0739 Gross per 24 hour  Intake -  Output 2275 ml  Net -2275 ml   Filed Weights   04/04/20 0438 04/05/20 0500 04/08/20 0423  Weight: 81.8 kg 79.5 kg 80 kg    Exam:  Constitutional: Calm but appears uncomfortable especially when extremities moved.  No acute distress otherwise Respiratory: Anterior lung sounds are clear and she is stable on room air Cardiovascular: Focal LUE edema with arm now elevated on pillow, pulse regular and heart sounds are normal Abdomen: PEG tube-soft and nontender with normoactive bowel sounds, LBM 3/11 Genitourinary: Right nephrostomy tube with yellow urine noted in collection bag-Foley with yellow urine to bedside bag. Neurologic: Cranial nerves II through XII intact. Psychiatric: Minimally responsive primarily to pain and does not open eyes or follow commands.   Assessment/Plan: Acute problems: Bilateral Thalamic Infarcts Persistent acute metabolic encephalopathy  -due to CVA, mental status waxing and waning, at times  she is minimally responsive versus talking some.  Neurology following -Initially started on Provigil with gradual up titration but unfortunately developed tremulousness and dose was discontinued.   -She had also been given a trial of amantadine but this was discontinued.   -She has a prior history of embolic infarcts with imaging on admission showed punctate acute  infarction in the left superior cerebellum adjacent to the superior cerebellar peduncle.  Bilateral thalamic acute infarction extending back into the midbrain, consistent with artery of Percheron infarction.    -She had an event with asymmetric pupils on 2/25, had a repeat MRI without any new acute findings and expected evolution of known infarcts.   EEG 2/13 with moderate diffuse encephalopathy no apparent underlying seizure activity -Continue Plavix  -Resume statin at half prior dose.  Was on Crestor 5 mg daily we will start at 2.5 mg and follow LFTs and uptitrate as tolerated.  Hypertonicity and spasticity secondary to recent brain injury from stroke -Patient having significant hypertonicity of lower extremities which preclude OT and PT working effectively with her -She is also having spasticity related tremors in the upper extremities also impeding ability to participate with therapy -In addition with severe hypertonicity appears to be causing the patient pain -Skelaxin dc'd 2/2 transaminitis.  -AST and ALT have have normalized therefore will resume Robaxin 500 mg twice daily and repeat LFTs on Wednesday and Friday.  If LFTs remain stable can slowly uptitrate Robaxin. -3/10 began low-dose OxyIR 2.5 mg scheduled every 6 hours -Consulted Dr. Riley Kill via secure chat to assist with initiating/titrating medications for spasticity and hypertonicity.   Left upper extremity swelling -Patient with left upper extremity swelling without evidence of fracture on imaging -This was associated with tachypnea (likely secondary to ongoing pain syndrome) but as a precaution D-dimer was checked and was elevated -CTA of chest without evidence of PE -Venous duplex did reveal an acute SVT of the left cephalic vein.  Likely related to prior IV placement -Continue DVT prophylaxis heparin for now but consider increasing to full dose if can tolerate in regards to bleeding risk; of note patient is currently on Plavix as  well  Recurrent urinary retention -Required repeat I/O cath-foley inserted 3/14 w/ 900 cc returned. -Possibly recurred 2/2 meds which have been dc'd  Group B strep UTI/recurrent fevers-MRSA UTI -GBS UTI from 2/12 appears to have been treated with ceftriaxone. 2/27 yeast UTI, completed fluconazole, WBC remains normal this morning  -Fever on 2/27, CXR with bibasilar opacities, urine culture showed yeast and completed fluconazole. Blood cultures1/2 with staph epidermidis (likely contaminated) -Low-grade temp 100.2 as of 3/5 overnight, afebrile since. Closely monitor -Another temp up to 101.1 on the afternoon of 3/14-pan cultured-UA abnormal-CXR unremarkable and actually demonstrates improving bibasilar atelectasis; as of a.m. of 3/15 patient is afebrile -Abdominal ultrasound without evidence of acute cholecystitis -Repeat urine cx resulted 3/17 c/w MRSA sensitive to doxy which was initiated  Goals of care discussion  -with palliative team x2 last interaction on 3/7.  At that time despite being made aware of patient's poor status and poor prognosis her husband reported to the palliative provider that he was just not ready to let her go.  Her husband did wish for PEG placement  -Husband is agreeable to outpatient palliative provider follow-up in the event his wife further declines and would need referral to hospice care -Dtr in law states husband told by neurology that if pt were to improve and fully awaken it would take at least 2 months  Dysphagia/Nutrition Nutrition Status: Nutrition Problem: Inadequate oral intake Etiology: inability to eat Signs/Symptoms: NPO status Interventions: Tube feeding,Prostat Estimated body mass index is 31.24 kg/m as calculated from the following:   Height as of this encounter:  (1.6 m).   Weight as of this encounter: 80 kg. -Remains lethargic with most recent SLP evaluation on 3/7 recommended continued n.p.o. status-continue cortrack for tube feedings  - s/p PEG 3/14  CKD 3A- AKI due to dehydration  Right Sided Hydronephrosis  -Baseline creatinine ~ 1.0-1.3 -Creatinine has stabilized with increasing free water from 100 cc to 200 cc every 6 hours -Earlier in the hospitalization due to steady rise of creatinine arenal ultrasound completed whichshowed right sided hydronephrosis(no mass or definite calcification identified).  Nephrology, urology consulted and she is status post right nephrostomy tube placement by IR on 2/21.  -Has continued to have issues with urinary retention therefore Foley catheter placed overnight into 3/14 with significant amount of urine returned i.e. greater than 900 cc  Diabetes mellitus 2 on current long-term insulin with hyperglycemia -Continue on insulin sliding scale every 4 hours while she is on tube feeds -Continue Levemir 5 units daily along with NovoLog 2 units every 4 hours  -CBGs have been between 110 and 164 over the past 24 hours -HgbA1c = 7.6  Hypertension/chronic grade 2 diastolic heart failure -2D echoperformedduring this admission reveals grade 2 diastolic dysfunction -Remains euvolemic although overall net INO less than 10,000 cc since admission -Continue Norvasc  Physical deconditioning/residual left hemiplegia from prior stroke PT eval 3/18:    Patient limited by lethargy and level of arousal. Patient able to keep eyes open for ~1 minute after cleaning of eyes. Patient grunting with pain and questions. Performed PROM of B UEs and LEs with unpurposeful resisting of movements. Continue to recommend SNF for ongoing Physical Therapy.       OT eval 3/17   Patient supine in bed with spouse at side, patient with eyes closed and lethargic throughout session.  Noted L UE elevated upon entry but not with palm guard on; R UE with palm guard but not elevated.  Therapist doffed splint to R UE, provided PROM to BUEs and hygiene to B hands (total assist).  Educated spouse on elevation of BUEs and  splint use.  RN reports she elevated L UE this morning.  Reviewed splint schedule with RN and NT, educated to not over tighten splints and to keep UEs elevated- verbalized understanding. Updated whiteboard and will place schedule above head of bed. Will follow acutely.    SLP eval 3/17 Followed up for PO trials. Pt s/p PEG 3/14 for nutritional support. Lethargy continues to remain barrier to diet readiness. Xerostomia noted, improving post oral care by SLP. Pt assessed with single ice chips and 1/2 teaspoon of puree. Pt with reduced lingual movement/bolus coordination including some anterior spillage and delayed AP transit. Palpable swallows exhibited x3 with POs. No overt coughing however mentation remains poor. SLP to continue to monitor.   Other problems: Group B strep UTI/recent fevers -GBS UTI from 2/12 appears to have been treated with ceftriaxone. 2/27 yeast UTI, completed fluconazole, WBC remains normal this morning  -Fever on 2/27, CXR with bibasilar opacities, urine culture showed yeast and completed fluconazole. Blood cultures1/2 with staph epidermidis (likely contaminated) -3/15 see above regarding recurrence of fevers and additional work-up  Intermittent agitation  -Has had issues with agitation when awake but for the most part remains lethargic -Continue liberalized visitation so family can sit with her  Gluteal skin breakdown -[place] and barrier cream -Continue air mattress to prevent further skin breakdown -turn as able Wound / Incision (Open or Dehisced) 03/27/20 Puncture Flank Right;Posterior nephrostomy tube placement insertion site (Active)  Date First Assessed/Time First Assessed: 03/27/20 1406   Wound Type: Puncture  Location: Flank  Location Orientation: Right;Posterior  Wound Description (Comments): nephrostomy tube placement insertion site  Present on Admission: No    Assessments 03/27/2020  8:09 AM 04/23/2020  7:54 AM  Dressing Type Gauze (Comment) Gauze (Comment)   Dressing Changed New Changed  Dressing Status Clean;Dry;Intact Clean;Dry;Intact  Dressing Change Frequency - Daily  Site / Wound Assessment Clean;Dry Clean;Dry  Closure - Sutures  Drainage Amount - None  Drainage Description - No odor  Treatment - Irrigation     No Linked orders to display     Wound / Incision (Open or Dehisced) 04/17/20 Puncture Abdomen Left;Anterior;Upper G-tube insertion site  (Active)  Date First Assessed/Time First Assessed: 04/17/20 0915   Wound Type: Puncture  Location: Abdomen  Location Orientation: Left;Anterior;Upper  Wound Description (Comments): G-tube insertion site   Present on Admission: No    Assessments 04/17/2020  7:20 AM 04/23/2020  7:54 AM  Dressing Type Gauze (Comment);Tape dressing Gauze (Comment)  Dressing Changed New Changed  Dressing Status Clean;Dry;Intact Clean;Dry;Intact  Dressing Change Frequency PRN PRN  Site / Wound Assessment Dressing in place / Unable to assess Clean;Dry  Peri-wound Assessment Intact Intact  Closure - None  Drainage Amount None None  Treatment - Cleansed     No Linked orders to display    Hypothyroidism -Continue Synthroid per tube -TSH 6.651 on 2/12 and likely an acute phase reactant consistent with sick euthyroid syndrome -TSH with T3 and T4 will need to be repeated in May 2022  Hyperkalemia Resolved -Potassium normal   Data Reviewed: Basic Metabolic Panel: Recent Labs  Lab 04/19/20 0421 04/20/20 0417 04/22/20 0055 04/23/20 0845 04/24/20 0458  NA 138 137 136 136 135  K 4.0 4.5 4.6 4.2 4.4  CL 102 98 100 98 98  CO2 GLUCOSE 179* 176* 137* 139* 178*  BUN 44* 40* 40* 36* 35*  CREATININE 1.08* 1.00 0.98 0.86 0.89  CALCIUM 9.6 9.7 9.4 9.4 9.4  MG  --   --  1.9 2.0  --    Liver Function Tests: Recent Labs  Lab 04/18/20 0234 04/19/20 0421 04/20/20 0417 04/22/20 0055 04/24/20 0458  AST 144* 65* 34 21 19  ALT 67* 67* 45* 30 20  ALKPHOS 326* 287* 277* 190* 144*  BILITOT 2.1*  0.7 0.6 0.7 0.7  PROT 6.0* 5.8* 6.2* 6.0* 5.6*  ALBUMIN 2.4* 2.2* 2.3* 2.3* 2.1*   Recent Labs  Lab 04/19/20 0421  LIPASE 31   Recent Labs  Lab 04/22/20 0055  AMMONIA 12   CBC: Recent Labs  Lab 04/19/20 0421 04/20/20 0417 04/22/20 0055 04/23/20 0845 04/24/20 0458  WBC 5.3 5.4 5.8 5.7 5.8  NEUTROABS 3.4  --   --  3.7  --   HGB 10.3* 11.1* 10.9* 10.8* 10.3*  HCT 31.9* 34.5* 33.4* 33.1* 32.9*  MCV 99.7 98.6 97.9 98.2 99.4  PLT 250 286 299 289 267   Cardiac Enzymes: No results for input(s): CKTOTAL, CKMB, CKMBINDEX, TROPONINI in the last 168 hours. BNP (last 3 results) No results for input(s): BNP in the last 8760 hours.  ProBNP (last 3 results) No results for input(s): PROBNP in the last 8760 hours.  CBG: Recent Labs  Lab 04/23/20  1522 04/23/20 1959 04/23/20 2342 04/24/20 0403 04/24/20 0753  GLUCAP 157* 189* 112* 180* 121*    Recent Results (from the past 240 hour(s))  Surgical pcr screen     Status: Abnormal   Collection Time: 04/16/20  7:28 PM   Specimen: Nasal Mucosa; Nasal Swab  Result Value Ref Range Status   MRSA, PCR NEGATIVE NEGATIVE Final   Staphylococcus aureus POSITIVE (A) NEGATIVE Final    Comment: (NOTE) The Xpert SA Assay (FDA approved for NASAL specimens in patients 81 years of age and older), is one component of a comprehensive surveillance program. It is not intended to diagnose infection nor to guide or monitor treatment. Performed at [facility], [address] [place], [place] [address]   Culture, blood (routine x 2)     Status: None   Collection Time: 04/17/20  4:48 PM   Specimen: BLOOD  Result Value Ref Range Status   Specimen Description BLOOD LEFT ANTECUBITAL  Final   Special Requests   Final    BOTTLES DRAWN AEROBIC ONLY Blood Culture adequate volume   Culture   Final    NO GROWTH 5 DAYS Performed at [facility], [address], [place], [place] [address]    Report Status 04/22/2020 FINAL  Final  Culture,  blood (routine x 2)     Status: Abnormal   Collection Time: 04/17/20  4:48 PM   Specimen: BLOOD RIGHT HAND  Result Value Ref Range Status   Specimen Description BLOOD RIGHT HAND  Final   Special Requests   Final    BOTTLES DRAWN AEROBIC ONLY Blood Culture adequate volume   Culture  Setup Time   Final    AEROBIC BOTTLE ONLY GRAM POSITIVE COCCI CRITICAL RESULT CALLED TO, READ BACK BY AND VERIFIED WITH: PHARMD THOMAS J.  [id number] [phone] FCP    Culture (A)  Final    STAPHYLOCOCCUS EPIDERMIDIS THE SIGNIFICANCE OF ISOLATING THIS ORGANISM FROM A SINGLE SET OF BLOOD CULTURES WHEN MULTIPLE SETS ARE DRAWN IS UNCERTAIN. PLEASE NOTIFY THE MICROBIOLOGY DEPARTMENT WITHIN ONE WEEK IF SPECIATION AND SENSITIVITIES ARE REQUIRED. Performed at [facility], [address] [place], [place] [address]    Report Status 04/21/2020 FINAL  Final  Blood Culture ID Panel (Reflexed)     Status: Abnormal   Collection Time: 04/17/20  4:48 PM  Result Value Ref Range Status   Enterococcus faecalis NOT DETECTED NOT DETECTED Final   Enterococcus Faecium NOT DETECTED NOT DETECTED Final   Listeria monocytogenes NOT DETECTED NOT DETECTED Final   Staphylococcus species DETECTED (A) NOT DETECTED Final    Comment: CRITICAL RESULT CALLED TO, READ BACK BY AND VERIFIED WITH: PHARMD THOMAS J.  [id number] FCP    Staphylococcus aureus (BCID) NOT DETECTED NOT DETECTED Final   Staphylococcus epidermidis DETECTED (A) NOT DETECTED Final    Comment: Methicillin (oxacillin) resistant coagulase negative staphylococcus. Possible blood culture contaminant (unless isolated from more than one blood culture draw or clinical case suggests pathogenicity). No antibiotic treatment is indicated for blood  culture contaminants. CRITICAL RESULT CALLED TO, READ BACK BY AND VERIFIED WITH: PHARMD THOMAS J.  [id number] [phone] FCP    Staphylococcus lugdunensis NOT DETECTED NOT DETECTED Final   Streptococcus species NOT DETECTED NOT DETECTED Final    Streptococcus agalactiae NOT DETECTED NOT DETECTED Final   Streptococcus pneumoniae NOT DETECTED NOT DETECTED Final   Streptococcus pyogenes NOT DETECTED NOT DETECTED Final   A.calcoaceticus-baumannii NOT DETECTED NOT DETECTED Final   Bacteroides  fragilis NOT DETECTED NOT DETECTED Final   Enterobacterales NOT DETECTED NOT DETECTED Final   Enterobacter cloacae complex NOT DETECTED NOT DETECTED Final   Escherichia coli NOT DETECTED NOT DETECTED Final   Klebsiella aerogenes NOT DETECTED NOT DETECTED Final   Klebsiella oxytoca NOT DETECTED NOT DETECTED Final   Klebsiella pneumoniae NOT DETECTED NOT DETECTED Final   Proteus species NOT DETECTED NOT DETECTED Final   Salmonella species NOT DETECTED NOT DETECTED Final   Serratia marcescens NOT DETECTED NOT DETECTED Final   Haemophilus influenzae NOT DETECTED NOT DETECTED Final   Neisseria meningitidis NOT DETECTED NOT DETECTED Final   Pseudomonas aeruginosa NOT DETECTED NOT DETECTED Final   Stenotrophomonas maltophilia NOT DETECTED NOT DETECTED Final   Candida albicans NOT DETECTED NOT DETECTED Final   Candida auris NOT DETECTED NOT DETECTED Final   Candida glabrata NOT DETECTED NOT DETECTED Final   Candida krusei NOT DETECTED NOT DETECTED Final   Candida parapsilosis NOT DETECTED NOT DETECTED Final   Candida tropicalis NOT DETECTED NOT DETECTED Final   Cryptococcus neoformans/gattii NOT DETECTED NOT DETECTED Final   Methicillin resistance mecA/C DETECTED (A) NOT DETECTED Final    Comment: CRITICAL RESULT CALLED TO, READ BACK BY AND VERIFIED WITH: Marissa Nestle  [id number] [phone] FCP Performed at [facility], [address], [place], [place] [address]   Culture, Urine     Status: Abnormal   Collection Time: 04/18/20  3:57 AM   Specimen: Urine, Catheterized  Result Value Ref Range Status   Specimen Description URINE, CATHETERIZED  Final   Special Requests   Final    NONE Performed at [facility], [address]  [place], [place] [address]    Culture (A)  Final    80,000 COLONIES/mL METHICILLIN RESISTANT STAPHYLOCOCCUS AUREUS   Report Status 04/20/2020 FINAL  Final   Organism ID, Bacteria METHICILLIN RESISTANT STAPHYLOCOCCUS AUREUS (A)  Final      Susceptibility   Methicillin resistant staphylococcus aureus - MIC*    CIPROFLOXACIN >=8 RESISTANT Resistant     GENTAMICIN <=0.5 SENSITIVE Sensitive     NITROFURANTOIN <=16 SENSITIVE Sensitive     OXACILLIN >=4 RESISTANT Resistant     TETRACYCLINE <=1 SENSITIVE Sensitive     VANCOMYCIN <=0.5 SENSITIVE Sensitive     TRIMETH/SULFA <=10 SENSITIVE Sensitive     CLINDAMYCIN <=0.25 SENSITIVE Sensitive     RIFAMPIN <=0.5 SENSITIVE Sensitive     Inducible Clindamycin NEGATIVE Sensitive     * 80,000 COLONIES/mL METHICILLIN RESISTANT STAPHYLOCOCCUS AUREUS     Studies: DG Forearm Left  Result Date: 04/23/2020 CLINICAL DATA:  Left forearm pain. EXAM: LEFT FOREARM - 2 VIEW COMPARISON:  None. FINDINGS: There is no evidence of fracture or other focal bone lesions. Soft tissues are unremarkable. IMPRESSION: Negative. Electronically Signed   By: Romona Curls M.D.   On: 04/23/2020 15:14   CT ANGIO CHEST PE W OR WO CONTRAST  Result Date: 04/23/2020 CLINICAL DATA:  Increased lethargy. Artery of Percheron infarction. Tachypnea. EXAM: CT ANGIOGRAPHY CHEST WITH CONTRAST TECHNIQUE: Multidetector CT imaging of the chest was performed using the standard protocol during bolus administration of intravenous contrast. Multiplanar CT image reconstructions and MIPs were obtained to evaluate the vascular anatomy. CONTRAST:  OMNIPAQUE IOHEXOL 350 MG/ML SOLN COMPARISON:  Chest radiograph 04/22/2020 and CT chest from 03/25/2020 FINDINGS: Despite efforts by the technologist and patient, motion artifact is present on today's exam and could not be eliminated. This reduces exam sensitivity and specificity. Streak artifact from  the patient's bilateral humeral implants also reduces to signal  to noise ratio in a way that adversely impacts diagnostic sensitivity and specificity. Cardiovascular: No filling defect is identified in the pulmonary arterial tree to suggest pulmonary embolus. Coronary, aortic arch, and branch vessel atherosclerotic vascular disease. Mitral valve calcification. Mild aortic valve calcification. Mediastinum/Nodes: Unremarkable Lungs/Pleura: Mild dependent airspace opacity in the posterior basal segment right lower lobe probably from atelectasis. Linear subsegmental atelectasis or scarring in the posterior basal segment left lower lobe. Upper Abdomen: Unremarkable Musculoskeletal: Right proximal humeral prosthesis. Reverse left total shoulder prosthesis. Mild thoracic kyphosis. Thoracic spondylosis. Review of the MIP images confirms the above findings. IMPRESSION: 1. No filling defect is identified in the pulmonary arterial tree to suggest pulmonary embolus. 2. Coronary, aortic arch, and branch vessel atherosclerotic vascular disease. Mild aortic valve calcification. Mitral valve calcification. 3. Mild dependent airspace opacity in the posterior basal segment right lower lobe probably from atelectasis. 4. Linear subsegmental atelectasis or scarring in the posterior basal segment left lower lobe. 5. Aortic atherosclerosis. Aortic Atherosclerosis (ICD10-I70.0). Electronically Signed   By: Gaylyn RongWalter  Liebkemann M.D.   On: 04/23/2020 14:55   DG CHEST PORT 1 VIEW  Result Date: 04/22/2020 CLINICAL DATA:  Tachypnea EXAM: PORTABLE CHEST 1 VIEW COMPARISON:  04/17/2020 FINDINGS: Bilateral shoulder replacements. Mild cardiomegaly. No confluent opacities or effusions. No acute bony abnormality. IMPRESSION: Mild cardiomegaly.  No active disease. Electronically Signed   By: Charlett NoseKevin  Dover M.D.   On: 04/22/2020 16:23   DG Shoulder Left  Result Date: 04/23/2020 CLINICAL DATA:  Left shoulder pain. EXAM: LEFT SHOULDER - 2+ VIEW COMPARISON:  None. FINDINGS: The patient is status post a left  shoulder arthroplasty. There is no evidence of fracture or dislocation. Soft tissues are unremarkable. IMPRESSION: No acute osseous injury. Electronically Signed   By: Romona Curlsyler  Litton M.D.   On: 04/23/2020 15:15   DG Humerus Left  Result Date: 04/23/2020 CLINICAL DATA:  Pain in the left arm. EXAM: LEFT HUMERUS - 2+ VIEW COMPARISON:  None. FINDINGS: The patient is status post a left shoulder arthroplasty. There is no evidence of fracture or other focal bone lesions. Soft tissues are unremarkable. IMPRESSION: Negative. Electronically Signed   By: Romona Curlsyler  Litton M.D.   On: 04/23/2020 15:14   VAS US UPPER EXTREMITY VENOUS DUPLEX  Result Date: 04/23/2020 UPPER VENOUS STUDY  Indications: Pain, and Swelling Limitations: Body habitus and edema, inability to flex arm. Comparison Study: No prior study Performing Technologist: Sherren Kernsandace Kanady RVS  Examination Guidelines: A complete evaluation includes B-mode imaging, spectral Doppler, color Doppler, and power Doppler as needed of all accessible portions of each vessel. Bilateral testing is considered an integral part of a complete examination. Limited examinations for reoccurring indications may be performed as noted.  Right Findings: +----------+------------+---------+-----------+----------+-------+ RIGHT     CompressiblePhasicitySpontaneousPropertiesSummary +----------+------------+---------+-----------+----------+-------+ Subclavian               Yes       Yes                      +----------+------------+---------+-----------+----------+-------+  Left Findings: +----------+------------+---------+-----------+----------+--------------+ LEFT      CompressiblePhasicitySpontaneousProperties   Summary     +----------+------------+---------+-----------+----------+--------------+ IJV           Full       Yes       Yes                             +----------+------------+---------+-----------+----------+--------------+  Subclavian    Full       Yes        Yes                             +----------+------------+---------+-----------+----------+--------------+ Axillary      Full       Yes       Yes                             +----------+------------+---------+-----------+----------+--------------+ Brachial      Full       Yes       Yes                             +----------+------------+---------+-----------+----------+--------------+ Radial        Full                                                 +----------+------------+---------+-----------+----------+--------------+ Ulnar                                               Not visualized +----------+------------+---------+-----------+----------+--------------+ Cephalic      None                 No                   Acute      +----------+------------+---------+-----------+----------+--------------+ Basilic       Full                                                 +----------+------------+---------+-----------+----------+--------------+  Summary:  Right: No evidence of thrombosis in the subclavian.  Left: No evidence of deep vein thrombosis in the upper extremity. Findings consistent with acute superficial vein thrombosis involving the left cephalic vein.  *See table(s) above for measurements and observations.    Preliminary     Scheduled Meds: . amLODipine  5 mg Per Tube Daily  . chlorhexidine  15 mL Mouth Rinse BID  . Chlorhexidine Gluconate Cloth  6 each Topical Daily  . clopidogrel  75 mg Per Tube Daily  . feeding supplement (PROSource TF)  45 mL Per Tube TID  . free water  200 mL Per Tube Q6H  . heparin  5,000 Units Subcutaneous Q8H  . insulin aspart  0-9 Units Subcutaneous Q4H  . insulin aspart  2 Units Subcutaneous Q4H  . insulin detemir  5 Units Subcutaneous Daily  . levothyroxine  112 mcg Per Tube QAC breakfast  . liver oil-zinc oxide   Topical BID  . mouth rinse  15 mL Mouth Rinse q12n4p  . methocarbamol  500 mg Per Tube BID  .  modafinil  200 mg Per Tube Daily  . oxyCODONE  2.5 mg Per Tube Q8H  . sodium chloride flush  5 mL Intracatheter Q8H   Continuous Infusions: . sodium chloride Stopped (04/23/20 0049)  . doxycycline (VIBRAMYCIN) IV 100 mg (04/23/20 2259)  . feeding supplement (GLUCERNA  1.2 CAL) 1,000 mL (04/23/20 1856)    Principal Problem:   Acute encephalopathy Active Problems:   Diabetes mellitus (HCC)   Hypothyroidism   History of embolic stroke   Acute bilateral thalamic infarction (HCC)   Dysphagia   CKD (chronic kidney disease), symptom management only, stage 3 (moderate) (HCC)   Hydronephrosis   Group B streptococcal UTI   Uncontrolled type 2 diabetes mellitus with hyperglycemia, with long-term current use of insulin (HCC)   Chronic diastolic heart failure (HCC)   Essential hypertension   Physical debility   Left hemiplegia (HCC)   Palliative care by specialist   Goals of care, counseling/discussion   DNR (do not resuscitate)   Fever   Acute lower UTI   Tachypnea   Left upper extremity swelling   Consultants: Neurology  Palliative care Nephrology Urology IR  Procedures: Right percutaneous nephrostomy 2/21  Antibiotics: Anti-infectives (From admission, onward)   Start     Dose/Rate Route Frequency Ordered Stop   04/20/20 1000  doxycycline (VIBRAMYCIN) 100 mg in sodium chloride 0.9 % 250 mL IVPB        100 mg 125 mL/hr over 120 Minutes Intravenous Every 12 hours 04/20/20 0831 04/25/20 0959   04/17/20 0807  ceFAZolin (ANCEF) 2-4 GM/100ML-% IVPB       Note to Pharmacy: Cristy Friedlander   : cabinet override      04/17/20 0807 04/17/20 0847   04/07/20 2000  fluconazole (DIFLUCAN) 10 MG/ML suspension 100 mg        100 mg Per Tube Every evening 04/07/20 1236 04/08/20 2023   04/03/20 2000  fluconazole (DIFLUCAN) 40 MG/ML suspension 100 mg  Status:  Discontinued       "Followed by" Linked Group Details   100 mg Per Tube Every 24 hours 04/02/20 1752 04/07/20 1236   04/02/20 1900   fluconazole (DIFLUCAN) 40 MG/ML suspension 200 mg       "Followed by" Linked Group Details   200 mg Per Tube  Once 04/02/20 1752 04/02/20 2020   03/26/20 0730  cefTRIAXone (ROCEPHIN) 1 g in sodium chloride 0.9 % 100 mL IVPB        1 g 200 mL/hr over 30 Minutes Intravenous Every 24 hours 03/26/20 0644 03/30/20 0844   03/18/20 1900  azithromycin (ZITHROMAX) 500 mg in sodium chloride 0.9 % 250 mL IVPB  Status:  Discontinued        500 mg 250 mL/hr over 60 Minutes Intravenous Every 24 hours 03/18/20 1830 03/19/20 1106   03/18/20 1900  cefTRIAXone (ROCEPHIN) 2 g in sodium chloride 0.9 % 100 mL IVPB  Status:  Discontinued        2 g 200 mL/hr over 30 Minutes Intravenous Every 24 hours 03/18/20 1830 03/19/20 1106       Time spent: 30 minutes    Junious Silk ANP  [facility] 7 am - 330 pm/M-F for direct patient care and secure chat Please refer to Amion for contact info 37  days

## 2020-04-24 NOTE — TOC Progression Note (Signed)
Transition of Care [facility]) - Progression Note    Patient Details  Name: Hannah Fischer MRN: [id number] Date of Birth: 25-Mar-1932  Transition of Care [facility]) CM/SW Contact  Janae Bridgeman, RN Phone Number: 04/24/2020, 2:23 PM  Clinical Narrative:    Case management spoke with Junious Silk, NP and patient is currently to stable to SNF placement - pending at [facility].  The patient's insurance authorization expired last week when she was unable to transfer to the facility at that time.  CM and MSW will continue to follow the patient for SNF placement and will start insurance authorization as soon as patient is medically stable to transfer to a SNF facility for care.  The patient's family is willing to pay for [facility] placement if needed if her Medicare is unwilling to cover SNf placement costs.  Expected Discharge Plan: Skilled Nursing Facility Barriers to Discharge: Continued Medical Work up (Patient scheduled for PEG placement on 04/17/2020)  Expected Discharge Plan and Services Expected Discharge Plan: Skilled Nursing Facility In-house Referral: Clinical Social Work,Hospice / Palliative Care Discharge Planning Services: CM Consult Post Acute Care Choice: Skilled Nursing Facility Living arrangements for the past 2 months: Skilled Nursing Facility                                       Social Determinants of Health (SDOH) Interventions    Readmission Risk Interventions Readmission Risk Prevention Plan 04/13/2020  Transportation Screening Complete  Medication Review [employer]) Complete  PCP or Specialist appointment within 3-5 days of discharge Complete  HRI or Home Care Consult Complete  SW Recovery Care/Counseling Consult Complete  Palliative Care Screening Complete  Skilled Nursing Facility Complete  Some recent data might be hidden

## 2020-04-24 NOTE — Progress Notes (Signed)
Physical Therapy Treatment Patient Details Name: Hannah Fischer MRN: [id number] DOB: 1932/03/11 Today's Date: 04/24/2020    History of Present Illness Pt is an 87 y/o female admitted 03/18/20 with CVA. Imaging (+) for bilateral medial thalamic and punctate infarcts and L superior cerebellar infarcts. PEG placed 3/14. PMH significant for multiple CVAs with most recent ~3 months ago, residual L-sided hemiplegia with recent d/c from outpatient PT/OT with return to baseline level of function, DM, PE, HTN.    PT Comments    Pt remains limited in progress by her low level of arousal. Pt not opening eyes or answering any questions during session. No active assistance noted except possible activation with L elbow flexion 1x and L knee flexion 1x. Otherwise, pt requires TA to come to long-sitting in bed. Focused session on addressing general limitations in ROM through providing PROM at neck, legs, and UEs, see Exercises below. Due to pt's continued inability to actively participate and poor level of arousal, decreasing frequency to 1x/week at this time. If pt makes improvements please notify PT. Will continue to follow acutely. Current recommendations remain appropriate.     Follow Up Recommendations  SNF;Supervision/Assistance - 24 hour     Equipment Recommendations  Wheelchair cushion (measurements PT);Wheelchair (measurements PT);Hospital bed    Recommendations for Other Services       Precautions / Restrictions Precautions Precautions: Fall Precaution Comments: nephrostomy drain, at risk for skin breakdown, PEG Restrictions Weight Bearing Restrictions: No    Mobility  Bed Mobility Overal bed mobility: Needs Assistance Bed Mobility: Supine to Sit     Supine to sit: Total assist;HOB elevated     General bed mobility comments: TA for supine > long-sitting in bed with HOB elevated, 3x. Pt not opening eyes or activating to assist.    Transfers                 General  transfer comment: Deferred due to level of arousal  Ambulation/Gait             General Gait Details: Deferred due to level of arousal   Stairs             Wheelchair Mobility    Modified Rankin (Stroke Patients Only) Modified Rankin (Stroke Patients Only) Pre-Morbid Rankin Score: No significant disability Modified Rankin: Severe disability     Balance Overall balance assessment: Needs assistance   Sitting balance-Leahy Scale: Zero Sitting balance - Comments: TA to come to long-sitting in bed, no active assist from pt noted                                    Cognition Arousal/Alertness: Lethargic Behavior During Therapy: Flat affect Overall Cognitive Status: Difficult to assess                                 General Comments: pt lethargic throughout session, keeping eyes closed and only grimacing to stretching and passive movement.      Exercises General Exercises - Upper Extremity Shoulder Flexion: PROM;10 reps;Supine;Both Elbow Flexion: PROM;Left;Right;10 reps;Supine Elbow Extension: PROM;Right;Left;10 reps;Supine General Exercises - Lower Extremity Heel Slides: PROM;10 reps;Right;Left;Supine Other Exercises Other Exercises: PROM into L cervical lateral flexion 4x ~10-20 sec each Other Exercises: PROM to bil ankles into eversion, inversion, dorsiflexion, and plantarflexion 5x each    General Comments  Pertinent Vitals/Pain Pain Assessment: Faces Faces Pain Scale: Hurts little more Pain Location: generalized with all movement and stretching Pain Descriptors / Indicators: Grimacing;Guarding Pain Intervention(s): Limited activity within patient's tolerance;Monitored during session;Repositioned    Home Living                      Prior Function            PT Goals (current goals can now be found in the care plan section) Acute Rehab PT Goals Patient Stated Goal: Unable to state. PT Goal Formulation:  Patient unable to participate in goal setting Time For Goal Achievement: 05/01/20 Potential to Achieve Goals: Poor Progress towards PT goals: Not progressing toward goals - comment    Frequency    Min 1X/week      PT Plan Current plan remains appropriate;Frequency needs to be updated    Co-evaluation              AM-PAC PT "6 Clicks" Mobility   Outcome Measure  Help needed turning from your back to your side while in a flat bed without using bedrails?: Total Help needed moving from lying on your back to sitting on the side of a flat bed without using bedrails?: Total Help needed moving to and from a bed to a chair (including a wheelchair)?: Total Help needed standing up from a chair using your arms (e.g., wheelchair or bedside chair)?: Total Help needed to walk in hospital room?: Total Help needed climbing 3-5 steps with a railing? : Total 6 Click Score: 6    End of Session   Activity Tolerance: Patient limited by lethargy Patient left: with call bell/phone within reach;in bed Nurse Communication: Mobility status;Other (comment) (notified NT of bowel movement and bed alarm not working) PT Visit Diagnosis: Other symptoms and signs involving the nervous system (R29.898);Difficulty in walking, not elsewhere classified (R26.2);Unsteadiness on feet (R26.81);Muscle weakness (generalized) (M62.81)     Time: 1610-9604 PT Time Calculation (min) (ACUTE ONLY): 13 min  Charges:  $Therapeutic Exercise: 8-22 mins                     Raymond Gurney, PT, DPT Acute Rehabilitation Services  Pager: [phone] Office: [phone]    Jewel Baize 04/24/2020, 12:59 PM

## 2020-04-24 NOTE — Progress Notes (Signed)
  Speech Language Pathology Treatment: Dysphagia  Patient Details Name: Hannah Fischer MRN: [id number] DOB: 05-Dec-1932 Today's Date: 04/24/2020 Time: 1470-9295 SLP Time Calculation (min) (ACUTE ONLY): 10 min  Assessment / Plan / Recommendation Clinical Impression  Pt continues to be minimally responsive. With total assist feeding of drips of water siphoned from a straw pt will seal lips of straw and swallow water. However, she never initiates sipping without total assist from SLP. After trials there is delayed weak coughing indicative of possible aspiration. Her arousal has been persistently insufficient since admit and pt has not met therapy goals over the past month. Will sign off at this time. Please reorder if pts status changes.   HPI HPI: Hannah Fischer is a 87 y.o. female with bilateral medial thalamic and punctate left superior cerebellar infarcts. PMHx significant for multiple CVAs with most recent acute/subacute R frontal lobe and L parietal and occipital lobe infarcts ~3 months ago, residual L-sided hemiplegia, hypothyroidism, GERD, HTN, TIA, DM, PE, anxiety, HTN, and HLD. Pt s/p PEG 3/14, remains heavily lethargic.      SLP Plan          Recommendations  Diet recommendations: NPO Liquids provided via: Straw Medication Administration: Via alternative means                        GO                DeBlois, Katherene Ponto 04/24/2020, 9:24 AM

## 2020-04-24 NOTE — Consult Note (Signed)
   [facility] Inpatient Consult   04/24/2020  CAYDEE TALKINGTON 1932/12/16 [id number]  [facility] [[facility]]  Accountable Care Organization [ACO] Patient: [employer] Medicare  LOS: 36 day.  Patient is awaiting transition to a skilled nursing facility level of care.  Plan:  No [facility] Care Management community post hospital needs at this time.  Charlesetta Shanks, RN BSN CCM [facility]  [phone] business mobile phone Toll free office [phone]  Fax number: [phone] [email or web address]

## 2020-04-25 DIAGNOSIS — R4182 Altered mental status, unspecified: Secondary | ICD-10-CM

## 2020-04-25 DIAGNOSIS — G9340 Encephalopathy, unspecified: Secondary | ICD-10-CM | POA: Diagnosis not present

## 2020-04-25 LAB — GLUCOSE, CAPILLARY
Glucose-Capillary: 127 mg/dL — ABNORMAL HIGH (ref 70–99)
Glucose-Capillary: 130 mg/dL — ABNORMAL HIGH (ref 70–99)
Glucose-Capillary: 132 mg/dL — ABNORMAL HIGH (ref 70–99)
Glucose-Capillary: 144 mg/dL — ABNORMAL HIGH (ref 70–99)
Glucose-Capillary: 168 mg/dL — ABNORMAL HIGH (ref 70–99)
Glucose-Capillary: 171 mg/dL — ABNORMAL HIGH (ref 70–99)

## 2020-04-25 NOTE — Progress Notes (Signed)
PROGRESS NOTE    RANDA RISS  ZOX:[id number] DOB: 09/06/1932 DOA: 03/18/2020 PCP: Sigmund Hazel, MD   Chief Complaint  Patient presents with  . 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 Shehad gone to bedin the morning her husbandhad trouble waking her up. In the ED she was noted to be responsive to pain. A code stroke was called. She was outside the window for TPA. An emergent CT did not show any acute abnormality. A CTA was performed and did not reveal any obvious stenosis. An MRI of the brain was performed on 2/13:Punctate acute infarction in the left superior cerebellumadjacent to the superior cerebellar peduncle. Bilateral thalamicacute infarction extending back into the mid brain. This is consistent with artery of Percheron infarction.She continues to wax and wane from a mental status standpoint. She had cortrak and subsequently underwent PEG tube placement.. Palliative and neuro are following,   Assessment & Plan:   Principal Problem:   Acute encephalopathy Active Problems:   Diabetes mellitus (HCC)   Hypothyroidism   History of embolic stroke   Acute bilateral thalamic infarction (HCC)   Dysphagia   CKD (chronic kidney disease), symptom management only, stage 3 (moderate) (HCC)   Hydronephrosis   Group B streptococcal UTI   Uncontrolled type 2 diabetes mellitus with hyperglycemia, with long-term current use of insulin (HCC)   Chronic diastolic heart failure (HCC)   Essential hypertension   Physical debility   Left hemiplegia (HCC)   Palliative care by specialist   Goals of care, counseling/discussion   DNR (do not resuscitate)   Fever   Acute lower UTI   Tachypnea   Left upper extremity swelling  1 bilateral thalamic infarct/persistent acute metabolic  encephalopathy Secondary to acute CVA. -Patient noted to have some waxing and waning mental status and at times minimally responsive versus others where she is noted to talk. -Patient sleeping heavily today.  Patient noted to be alert yesterday and interacting.. -Patient initially noted to be on Provigil which was uptitrated however patient developed tremulousness and subsequently discontinued. -Patient also underwent trial of amantadine which was discontinued. -Patient with prior history of embolic infarcts imaging on admission showed punctate acute infarct in the left superior cerebellum adjacent to superior cerebellar peduncle.  Bilateral thalamic acute infarct extending back into the midbrain consistent with artery of Percheron infarction. -Patient noted to have an acute event with asymmetric pupils 2/25 underwent repeat MRI with no acute findings however expected evolution of known infarcts. -EEG done 03/19/2020 with moderate diffuse encephalopathy with no apparent underlying seizure activity. -Continue Plavix for secondary stroke prophylaxis. -Statin resumed at half the dose as transaminitis has resolved.   -Monitor LFTs.   -Patient with waxing and waning mental status.  May need palliative care to reassess.  2.  Hypertonicity and spasticity secondary to recent brain injury from stroke -Patient noted to have significant hypertonicity lower extremities affecting working with PT and OT efficiently. -Patient also noted to have spasticity related tremors in upper extremities -Skelaxin and Robaxin held secondary to transaminitis.  LFTs trending down. -Could possibly resume Robaxin. -Continue decreased current regimen of low-dose OxyIR 2.5 mg to every 8 hours and continue to wean to every 12 hours in the next 2 to 3 days. -Robaxin started at twice daily..  -Dr. Riley Kill, consulted via secure chat per Junious Silk, NP to assist with  initiating/titrating medications for spasticity and  hypertonicity.  3.  Tachypnea Patient noted to be tachypneic on 3/19-20, with respiratory rate in the 30s which has since resolved. -Questionable etiology.  Likely multifactorial secondary to pain, left upper extremity superficial vein thrombosis, problems #1 and 2.  Patient with sats of 100% on room air.    -Chest x-ray done 04/22/2020 with no acute abnormalities.   -D-dimer elevated and as such CT angiogram chest was obtained which was negative for PE.   -Left upper extremity swelling improved.   -Continue current pain management.   4.  Left upper extremity pain and swelling/superficial vein thrombosis Upper extremity Doppler consistent with superficial vein thrombosis likely secondary to IV.   -IV has been discontinued.   -Left upper extremity swelling and pain improved.   -Keep LUE elevated, warm compresses.   -On Plavix, heparin for DVT prophylaxis.   -Supportive care.    5.  Recurrent urinary retention Patient noted to have required I/O cath. -Foley catheter inserted 04/17/2020 with 950 cc of urine returning. -Concerned that urinary retention may be secondary to medications which have been discontinued. -Output of 2.550 cc from Foley catheter.   -Voiding trial tomorrow.  6.  Group B strep UTI/recurrent fevers/MRSA UTI (3/15) -Patient noted to have GBS UTI from 2/12 status post treatment with IV Rocephin. -2/27 noted to have yeast UTI status post course of fluconazole -Patient noted to have fever 2/27, chest x-ray with bibasilar opacities, urine culture with yeast status post completion of fluconazole. -Blood cultures 1/2 with staph epidermis likely contamination. -Patient noted to have low-grade temp 100.2 on 3/5 and temp of 101.1 3/14 and subsequently pancultured. -Urinalysis abnormal, chest x-ray unremarkable with improvement of bibasilar atelectasis. -Fever curve trended down. -Urine cultures with 80,000 colonies of MRSA.   -Due to ongoing fevers, patient started on IV  doxycycline and completed a 5-day course of treatment.     7.  Dysphagia/nutrition -Patient with inadequate oral intake.   -Status post PEG tube placement, 04/17/2020.   -Continue tube feeds.   -Dietitian following.  8.  AKI on chronic kidney disease stage IIIA/right-sided hydronephrosis -Baseline creatinine 1-1.3. -Early on during the hospitalization patient noted to have worsening renal function renal ultrasound done consistent with a severe right-sided hydronephrosis. -Neurology/urology consulted patient status post right nephrostomy tube placement per IR 03/27/2020 -Due to urinary retention Foley catheter was placed. -Urine output 2.550 cc over the past 24 hours. -Abdominal ultrasound on 04/18/2020 with resolution of previously noted right hydronephrosis on CT. -Renal function improved.  Creatinine is 0.89.   -Outpatient follow-up with urology.  9.  Diabetes mellitus type 2 Hemoglobin A1c 7.6.   -CBG 127.  -Levemir 5 units daily, NovoLog 2 units every 4 hours.  SSI.   10.  Chronic diastolic heart failure/hypertension 2D echo with grade 2 diastolic dysfunction. -Compensated.   -Plavix, Norvasc.  11.  Residual left hemiplegia from prior stroke/debility/deconditioning -PT/OT.   -Needs SNF  12.  Gluteal skin breakdown -Barrier cream, Desitin -Air mattress -Frequent turns.  13.  Hypothyroidism -Synthroid per tube.  14.  Hyperkalemia Resolved.         DVT prophylaxis: Heparin Code Status: DNR Family Communication: No family at bedside. Disposition:   Status is: Inpatient    Dispo: The patient is from: Home              Anticipated d/c is to: SNF              Patient currently Patient very slow  to progress with PT and OT and unclear if she is an appropriate rehab candidate for SNF or will need long-term placement.  Had PEG tube placed on 3/8.  Family has confirmed that they do not have the resources to pay for skilled nursing facility services out-of-pocket once  insurance benefits exhausted.   Difficult to place patient yes       Consultants:   Interventional radiology: Dr. Grace Isaac 04/11/2020  Urology: Dr. Marlou Porch 03/25/2020  Nephrology: Dr. Melanee Spry 03/25/2020  Palliative care: Lamarr Lulas 2, NP 03/21/2020  Neurology: Dr. Pearlean Brownie 03/18/2020  Procedures:   Right percutaneous nephrostomy 03/28/2019  CT angiogram head and neck 03/18/2020  MRI brain 03/19/2020, 03/31/2020  Gastrostomy tube placement under fluoroscopy per Dr. Deanne Coffer, interventional radiology 04/17/2020  Abdominal ultrasound 04/18/2020  Renal ultrasound 03/25/2020, 03/27/2020  CT chest abdomen and pelvis 03/25/2020  CT head 03/18/2020  MR venogram head with and without contrast 03/21/2020  CT angiogram chest 04/23/2020  Left upper extremity Dopplers 04/23/2020  Antimicrobials:  Anti-infectives (From admission, onward)   Start     Dose/Rate Route Frequency Ordered Stop   04/20/20 1000  doxycycline (VIBRAMYCIN) 100 mg in sodium chloride 0.9 % 250 mL IVPB        100 mg 125 mL/hr over 120 Minutes Intravenous Every 12 hours 04/20/20 0831 04/24/20 2350   04/17/20 0807  ceFAZolin (ANCEF) 2-4 GM/100ML-% IVPB       Note to Pharmacy: Cristy Friedlander   : cabinet override      04/17/20 0807 04/17/20 0847   04/07/20 2000  fluconazole (DIFLUCAN) 10 MG/ML suspension 100 mg        100 mg Per Tube Every evening 04/07/20 1236 04/08/20 2023   04/03/20 2000  fluconazole (DIFLUCAN) 40 MG/ML suspension 100 mg  Status:  Discontinued       "Followed by" Linked Group Details   100 mg Per Tube Every 24 hours 04/02/20 1752 04/07/20 1236   04/02/20 1900  fluconazole (DIFLUCAN) 40 MG/ML suspension 200 mg       "Followed by" Linked Group Details   200 mg Per Tube  Once 04/02/20 1752 04/02/20 2020   03/26/20 0730  cefTRIAXone (ROCEPHIN) 1 g in sodium chloride 0.9 % 100 mL IVPB        1 g 200 mL/hr over 30 Minutes Intravenous Every 24 hours 03/26/20 0644 03/30/20 0844   03/18/20 1900  azithromycin  (ZITHROMAX) 500 mg in sodium chloride 0.9 % 250 mL IVPB  Status:  Discontinued        500 mg 250 mL/hr over 60 Minutes Intravenous Every 24 hours 03/18/20 1830 03/19/20 1106   03/18/20 1900  cefTRIAXone (ROCEPHIN) 2 g in sodium chloride 0.9 % 100 mL IVPB  Status:  Discontinued        2 g 200 mL/hr over 30 Minutes Intravenous Every 24 hours 03/18/20 1830 03/19/20 1106       Subjective: Patient sleeping, does not open eyes to stimulation, grimaces with passive range of motion left upper extremity.  Objective: Vitals:   04/24/20 1943 04/24/20 2302 04/25/20 0341 04/25/20 0901  BP: 119/61 135/65 132/74 (!) 149/57  Pulse: 78 76 77 81  Resp: 17 17 18  (!) 23  Temp: 98.9 F (37.2 C) 99.3 F (37.4 C) 98 F (36.7 C) 98.4 F (36.9 C)  TempSrc: Oral Oral  Oral  SpO2: 100% 100% 99% 98%  Weight:      Height:        Intake/Output Summary (Last 24 hours) at  04/25/2020 1003 Last data filed at 04/25/2020 0353 Gross per 24 hour  Intake 5580 ml  Output 1925 ml  Net 3655 ml   Filed Weights   04/05/20 0500 04/08/20 0423  Weight: 79.5 kg 80 kg    Examination:  General exam: Sleeping. Respiratory system: Clear to auscultation bilaterally anterior lung fields.  No wheezes, no crackles, no rhonchi.  Normal respiratory effort.  Cardiovascular system: RRR no murmurs rubs or gallops.  No JVD.  No lower extremity edema.  Gastrointestinal system: Abdomen is soft, nontender, nondistended, positive bowel sounds.  PEG tube intact. Central nervous system: Sleeping, does not open eyes with stimulation, grimaces with passive range of motion/movement left upper extremity. Left upper extremity with improved swelling, less tender to palpation.   Improving spasticity right upper extremity. Hypertonicity bilateral lower extremities with pain improving.   Extremities: Left upper extremity with decreased swelling and less tenderness to palpation.  Improving spasticity right upper extremity.  Hypertonicity  bilateral lower extremities with pain improving.  Skin: No rashes, lesions or ulcers Psychiatry: Judgement and insight poor to fair.  Mood & affect appropriate.     Data Reviewed: I have personally reviewed following labs and imaging studies  CBC: Recent Labs  Lab 04/19/20 0421 04/20/20 0417 04/22/20 0055 04/23/20 0845 04/24/20 0458  WBC 5.3 5.4 5.8 5.7 5.8  NEUTROABS 3.4  --   --  3.7  --   HGB 10.3* 11.1* 10.9* 10.8* 10.3*  HCT 31.9* 34.5* 33.4* 33.1* 32.9*  MCV 99.7 98.6 97.9 98.2 99.4  PLT 250 286 299 289 267    Basic Metabolic Panel: Recent Labs  Lab 04/19/20 0421 04/20/20 0417 04/22/20 0055 04/23/20 0845 04/24/20 0458  NA 138 137 136 136 135  K 4.0 4.5 4.6 4.2 4.4  CL 102 98 100 98 98  CO2 GLUCOSE 179* 176* 137* 139* 178*  BUN 44* 40* 40* 36* 35*  CREATININE 1.08* 1.00 0.98 0.86 0.89  CALCIUM 9.6 9.7 9.4 9.4 9.4  MG  --   --  1.9 2.0  --     GFR: Estimated Creatinine Clearance: 46.3 mL/min (by C-G formula based on SCr of 0.89 mg/dL).  Liver Function Tests: Recent Labs  Lab 04/19/20 0421 04/20/20 0417 04/22/20 0055 04/24/20 0458  AST 65* 34 21 19  ALT 67* 45* 30 20  ALKPHOS 287* 277* 190* 144*  BILITOT 0.7 0.6 0.7 0.7  PROT 5.8* 6.2* 6.0* 5.6*  ALBUMIN 2.2* 2.3* 2.3* 2.1*    CBG: Recent Labs  Lab 04/24/20 1614 04/24/20 1941 04/24/20 2303 04/25/20 0340 04/25/20 0904  GLUCAP 146* 192* 141* 127* 132*     Recent Results (from the past 240 hour(s))  Surgical pcr screen     Status: Abnormal   Collection Time: 04/16/20  7:28 PM   Specimen: Nasal Mucosa; Nasal Swab  Result Value Ref Range Status   MRSA, PCR NEGATIVE NEGATIVE Final   Staphylococcus aureus POSITIVE (A) NEGATIVE Final    Comment: (NOTE) The Xpert SA Assay (FDA approved for NASAL specimens in patients 103 years of age and older), is one component of a comprehensive surveillance program. It is not intended to diagnose infection nor to guide or monitor  treatment. Performed at [facility], [address], [place], [place] [address]   Culture, blood (routine x 2)     Status: None   Collection Time: 04/17/20  4:48 PM   Specimen: BLOOD  Result Value Ref Range Status  Specimen Description BLOOD LEFT ANTECUBITAL  Final   Special Requests   Final    BOTTLES DRAWN AEROBIC ONLY Blood Culture adequate volume   Culture   Final    NO GROWTH 5 DAYS Performed at [facility], [address], [place], [place] [address]    Report Status 04/22/2020 FINAL  Final  Culture, blood (routine x 2)     Status: Abnormal   Collection Time: 04/17/20  4:48 PM   Specimen: BLOOD RIGHT HAND  Result Value Ref Range Status   Specimen Description BLOOD RIGHT HAND  Final   Special Requests   Final    BOTTLES DRAWN AEROBIC ONLY Blood Culture adequate volume   Culture  Setup Time   Final    AEROBIC BOTTLE ONLY GRAM POSITIVE COCCI CRITICAL RESULT CALLED TO, READ BACK BY AND VERIFIED WITH: PHARMD THOMAS J.  [id number] [phone] FCP    Culture (A)  Final    STAPHYLOCOCCUS EPIDERMIDIS THE SIGNIFICANCE OF ISOLATING THIS ORGANISM FROM A SINGLE SET OF BLOOD CULTURES WHEN MULTIPLE SETS ARE DRAWN IS UNCERTAIN. PLEASE NOTIFY THE MICROBIOLOGY DEPARTMENT WITHIN ONE WEEK IF SPECIATION AND SENSITIVITIES ARE REQUIRED. Performed at [facility], [address], [place], [place] [address]    Report Status 04/21/2020 FINAL  Final  Blood Culture ID Panel (Reflexed)     Status: Abnormal   Collection Time: 04/17/20  4:48 PM  Result Value Ref Range Status   Enterococcus faecalis NOT DETECTED NOT DETECTED Final   Enterococcus Faecium NOT DETECTED NOT DETECTED Final   Listeria monocytogenes NOT DETECTED NOT DETECTED Final   Staphylococcus species DETECTED (A) NOT DETECTED Final    Comment: CRITICAL RESULT CALLED TO, READ BACK BY AND VERIFIED WITH: PHARMD THOMAS J.  [phone] FCP    Staphylococcus aureus (BCID) NOT DETECTED NOT DETECTED Final   Staphylococcus  epidermidis DETECTED (A) NOT DETECTED Final    Comment: Methicillin (oxacillin) resistant coagulase negative staphylococcus. Possible blood culture contaminant (unless isolated from more than one blood culture draw or clinical case suggests pathogenicity). No antibiotic treatment is indicated for blood  culture contaminants. CRITICAL RESULT CALLED TO, READ BACK BY AND VERIFIED WITH: PHARMD THOMAS J.  [id number] [phone] FCP    Staphylococcus lugdunensis NOT DETECTED NOT DETECTED Final   Streptococcus species NOT DETECTED NOT DETECTED Final   Streptococcus agalactiae NOT DETECTED NOT DETECTED Final   Streptococcus pneumoniae NOT DETECTED NOT DETECTED Final   Streptococcus pyogenes NOT DETECTED NOT DETECTED Final   A.calcoaceticus-baumannii NOT DETECTED NOT DETECTED Final   Bacteroides fragilis NOT DETECTED NOT DETECTED Final   Enterobacterales NOT DETECTED NOT DETECTED Final   Enterobacter cloacae complex NOT DETECTED NOT DETECTED Final   Escherichia coli NOT DETECTED NOT DETECTED Final   Klebsiella aerogenes NOT DETECTED NOT DETECTED Final   Klebsiella oxytoca NOT DETECTED NOT DETECTED Final   Klebsiella pneumoniae NOT DETECTED NOT DETECTED Final   Proteus species NOT DETECTED NOT DETECTED Final   Salmonella species NOT DETECTED NOT DETECTED Final   Serratia marcescens NOT DETECTED NOT DETECTED Final   Haemophilus influenzae NOT DETECTED NOT DETECTED Final   Neisseria meningitidis NOT DETECTED NOT DETECTED Final   Pseudomonas aeruginosa NOT DETECTED NOT DETECTED Final   Stenotrophomonas maltophilia NOT DETECTED NOT DETECTED Final   Candida albicans NOT DETECTED NOT DETECTED Final   Candida auris NOT DETECTED NOT DETECTED Final   Candida glabrata NOT DETECTED NOT DETECTED Final   Candida krusei NOT DETECTED NOT DETECTED Final   Candida  parapsilosis NOT DETECTED NOT DETECTED Final   Candida tropicalis NOT DETECTED NOT DETECTED Final   Cryptococcus neoformans/gattii NOT DETECTED NOT DETECTED  Final   Methicillin resistance mecA/C DETECTED (A) NOT DETECTED Final    Comment: CRITICAL RESULT CALLED TO, READ BACK BY AND VERIFIED WITH: Marissa NestleHARMD THOMAS J.  [phone] FCP Performed at [facility], [address] [place], [place] [address]   Culture, Urine     Status: Abnormal   Collection Time: 04/18/20  3:57 AM   Specimen: Urine, Catheterized  Result Value Ref Range Status   Specimen Description URINE, CATHETERIZED  Final   Special Requests   Final    NONE Performed at [facility], [address] [place], [place] [address]    Culture (A)  Final    80,000 COLONIES/mL METHICILLIN RESISTANT STAPHYLOCOCCUS AUREUS   Report Status 04/20/2020 FINAL  Final   Organism ID, Bacteria METHICILLIN RESISTANT STAPHYLOCOCCUS AUREUS (A)  Final      Susceptibility   Methicillin resistant staphylococcus aureus - MIC*    CIPROFLOXACIN >=8 RESISTANT Resistant     GENTAMICIN <=0.5 SENSITIVE Sensitive     NITROFURANTOIN <=16 SENSITIVE Sensitive     OXACILLIN >=4 RESISTANT Resistant     TETRACYCLINE <=1 SENSITIVE Sensitive     VANCOMYCIN <=0.5 SENSITIVE Sensitive     TRIMETH/SULFA <=10 SENSITIVE Sensitive     CLINDAMYCIN <=0.25 SENSITIVE Sensitive     RIFAMPIN <=0.5 SENSITIVE Sensitive     Inducible Clindamycin NEGATIVE Sensitive     * 80,000 COLONIES/mL METHICILLIN RESISTANT STAPHYLOCOCCUS AUREUS         Radiology Studies: DG Forearm Left  Result Date: 04/23/2020 CLINICAL DATA:  Left forearm pain. EXAM: LEFT FOREARM - 2 VIEW COMPARISON:  None. FINDINGS: There is no evidence of fracture or other focal bone lesions. Soft tissues are unremarkable. IMPRESSION: Negative. Electronically Signed   By: Romona Curlsyler  Litton M.D.   On: 04/23/2020 15:14   CT ANGIO CHEST PE W OR WO CONTRAST  Result Date: 04/23/2020 CLINICAL DATA:  Increased lethargy. Artery of Percheron infarction. Tachypnea. EXAM: CT ANGIOGRAPHY CHEST WITH CONTRAST TECHNIQUE: Multidetector CT imaging of the chest was performed  using the standard protocol during bolus administration of intravenous contrast. Multiplanar CT image reconstructions and MIPs were obtained to evaluate the vascular anatomy. CONTRAST:  100mL OMNIPAQUE IOHEXOL 350 MG/ML SOLN COMPARISON:  Chest radiograph 04/22/2020 and CT chest from 03/25/2020 FINDINGS: Despite efforts by the technologist and patient, motion artifact is present on today's exam and could not be eliminated. This reduces exam sensitivity and specificity. Streak artifact from the patient's bilateral humeral implants also reduces to signal to noise ratio in a way that adversely impacts diagnostic sensitivity and specificity. Cardiovascular: No filling defect is identified in the pulmonary arterial tree to suggest pulmonary embolus. Coronary, aortic arch, and branch vessel atherosclerotic vascular disease. Mitral valve calcification. Mild aortic valve calcification. Mediastinum/Nodes: Unremarkable Lungs/Pleura: Mild dependent airspace opacity in the posterior basal segment right lower lobe probably from atelectasis. Linear subsegmental atelectasis or scarring in the posterior basal segment left lower lobe. Upper Abdomen: Unremarkable Musculoskeletal: Right proximal humeral prosthesis. Reverse left total shoulder prosthesis. Mild thoracic kyphosis. Thoracic spondylosis. Review of the MIP images confirms the above findings. IMPRESSION: 1. No filling defect is identified in the pulmonary arterial tree to suggest pulmonary embolus. 2. Coronary, aortic arch, and branch vessel atherosclerotic vascular disease. Mild aortic valve calcification. Mitral valve calcification. 3. Mild dependent airspace opacity in the posterior basal segment right lower  lobe probably from atelectasis. 4. Linear subsegmental atelectasis or scarring in the posterior basal segment left lower lobe. 5. Aortic atherosclerosis. Aortic Atherosclerosis (ICD10-I70.0). Electronically Signed   By: Gaylyn Rong M.D.   On: 04/23/2020 14:55    DG Shoulder Left  Result Date: 04/23/2020 CLINICAL DATA:  Left shoulder pain. EXAM: LEFT SHOULDER - 2+ VIEW COMPARISON:  None. FINDINGS: The patient is status post a left shoulder arthroplasty. There is no evidence of fracture or dislocation. Soft tissues are unremarkable. IMPRESSION: No acute osseous injury. Electronically Signed   By: Romona Curls M.D.   On: 04/23/2020 15:15   DG Humerus Left  Result Date: 04/23/2020 CLINICAL DATA:  Pain in the left arm. EXAM: LEFT HUMERUS - 2+ VIEW COMPARISON:  None. FINDINGS: The patient is status post a left shoulder arthroplasty. There is no evidence of fracture or other focal bone lesions. Soft tissues are unremarkable. IMPRESSION: Negative. Electronically Signed   By: Romona Curls M.D.   On: 04/23/2020 15:14        Scheduled Meds: . amLODipine  5 mg Per Tube Daily  . chlorhexidine  15 mL Mouth Rinse BID  . Chlorhexidine Gluconate Cloth  6 each Topical Daily  . clopidogrel  75 mg Per Tube Daily  . feeding supplement (PROSource TF)  45 mL Per Tube TID  . free water  200 mL Per Tube Q6H  . heparin  5,000 Units Subcutaneous Q8H  . insulin aspart  0-9 Units Subcutaneous Q4H  . insulin aspart  2 Units Subcutaneous Q4H  . insulin detemir  5 Units Subcutaneous Daily  . levothyroxine  112 mcg Per Tube QAC breakfast  . liver oil-zinc oxide   Topical BID  . mouth rinse  15 mL Mouth Rinse q12n4p  . methocarbamol  500 mg Per Tube BID  . modafinil  200 mg Per Tube Daily  . oxyCODONE  2.5 mg Per Tube Q8H  . rosuvastatin  2.5 mg Per Tube q1800  . sodium chloride flush  5 mL Intracatheter Q8H   Continuous Infusions: . sodium chloride Stopped (04/23/20 0049)  . feeding supplement (GLUCERNA 1.2 CAL) 1,000 mL (04/24/20 2103)     LOS: 38 days    Time spent: 35 minutes    Ramiro Harvest, MD [facility]   To contact the attending provider between 7A-7P or the covering provider during after hours 7P-7A, please log into the web site  [email or web address] and access using universal [place] password for that web site. If you do not have the password, please call the hospital operator.  04/25/2020, 10:03 AM

## 2020-04-25 NOTE — Progress Notes (Addendum)
[facility] PROGRESS NOTE  Hannah Fischer ZHY:[id number] DOB: [birth year] DOA: 03/18/2020 PCP: Sigmund Hazel, MD  Status: Remains inpatient appropriate because:Altered mental status, Unsafe d/c plan and Inpatient level of care appropriate due to severity of illness   Dispo: The patient is from: Home              Anticipated d/c is to: SNF              Patient currently is not medically stable to d/c.  Patient continues to require close monitoring of transaminases while we are adjusting her baclofen and other antispasmodic agents.              Barriers to discharge: Patient very slow to progress with PT and OT and unclear if she is an appropriate rehab candidate for SNF or will need long-term placement.  Had PEG tube placed on 3/8.  Family has confirmed that they do not have the resources to pay for skilled nursing facility services out-of-pocket once insurance benefits exhausted.   Difficult to place patient Yes   Level of care: Progressive  Code Status: DNR Family Communication: 3/14 husband at bedside.  Attempted to contact patient's spouse by phone on 3/22 but did not answer and was sent to voicemail. 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 Shehad gone to bedin the morning her husbandhad trouble waking her up. In the ED she was noted to be responsive to pain. A code stroke was called. She was outside the window for TPA. An emergent CT did not show any acute abnormality. A CTA was performed and did not reveal any obvious stenosis. An MRI of the brain was performed on 2/13:Punctate acute infarction in the left superior cerebellumadjacent to the superior cerebellar peduncle. Bilateral thalamicacute infarction extending back into the  mid brain. This is consistent with artery of Percheron infarction.She continues to wax and wane from a mental status standpoint. She has cortrak. Palliative and neuro are following, family eventually decided for PEG which is now pending Plavix washout  Subjective: Laying in bed.  Does not open eyes with stimulation but does grimace with passive range of motion and movement of left upper extremity.  Objective: Vitals:   04/24/20 2302 04/25/20 0341  BP: 135/65 132/74  Pulse: 76 77  Resp: 17 18  Temp: 99.3 F (37.4 C) 98 F (36.7 C)  SpO2: 100% 99%    Intake/Output Summary (Last 24 hours) at 04/25/2020 0818 Last data filed at 04/25/2020 0353 Gross per 24 hour  Intake 5580 ml  Output 1925 ml  Net 3655 ml   Filed Weights   04/05/20 0500 04/08/20 0423  Weight: 79.5 kg 80 kg    Exam:  Constitutional: Calm initially but became slightly agitated in reference to pain with any palpation or movement of extremities Respiratory: Lungs are clear to auscultation, she is stable on room air Cardiovascular: Focal LUE significantly improved although still has focal edema involving the left hand.  Previous erythema has resolved.  Pulse regular and heart sounds are normal Abdomen: PEG tube for feedings and medications, abdomen soft nontender with normoactive bowel sounds.  LBM 3/11 Genitourinary: Right nephrostomy tube with yellow urine noted in collection bag-Foley with yellow urine to bedside bag. Neurologic: Cranial nerves II through XII intact. Psychiatric: Seems to be awake but does  not open eyes grimaces in pain.  Nonverbal.  Does not interact.   Assessment/Plan: Acute problems: Bilateral Thalamic Infarcts Persistent acute metabolic encephalopathy  -due to CVA, mental status waxing and waning, at times she is minimally responsive versus talking some.  Neurology following -Initially started on Provigil with gradual up titration but unfortunately developed tremulousness and dose was  discontinued.   -She had also been given a trial of amantadine but this was discontinued.   -She has a prior history of embolic infarcts with imaging on admission showed punctate acute infarction in the left superior cerebellum adjacent to the superior cerebellar peduncle.  Bilateral thalamic acute infarction extending back into the midbrain, consistent with artery of Percheron infarction.    -She had an event with asymmetric pupils on 2/25, had a repeat MRI without any new acute findings and expected evolution of known infarcts.   EEG 2/13 with moderate diffuse encephalopathy no apparent underlying seizure activity -Continue Plavix  -Resume statin at half prior dose.  Was on Crestor 5 mg daily we will start at 2.5 mg and follow LFTs and uptitrate as tolerated.  Hypertonicity and spasticity secondary to recent brain injury from stroke -Patient having significant hypertonicity of lower extremities which preclude OT and PT working effectively with her -She is also having spasticity related tremors in the upper extremities also impeding ability to participate with therapy -In addition with severe hypertonicity appears to be causing the patient pain -Skelaxin dc'd 2/2 transaminitis.  -AST and ALT have have normalized therefore will resume Robaxin 500 mg twice daily and repeat LFTs on Wednesday and Friday.  If LFTs remain stable can slowly uptitrate Robaxin. -3/10 began low-dose OxyIR 2.5 mg scheduled every 6 hours -Consulted Dr. Riley Kill via secure chat to assist with initiating/titrating medications for spasticity and hypertonicity.   Left upper extremity swelling -Patient with left upper extremity swelling without evidence of fracture on imaging -This was associated with tachypnea (likely secondary to ongoing pain syndrome) but as a precaution D-dimer was checked and was elevated-CTA of chest without evidence of PE -Venous duplex did reveal an acute SVT of the left cephalic vein.  Likely related to  prior IV placement-with elevation erythema resolving. -Continue DVT prophylaxis heparin for now but consider increasing to full dose if can tolerate in regards to bleeding risk; of note patient is currently on Plavix as well  Recurrent urinary retention -Required repeat I/O cath-foley inserted 3/14 w/ 900 cc returned. -Possibly recurred 2/2 meds which have been dc'd  Group B strep UTI/recurrent fevers-MRSA UTI -GBS UTI from 2/12 appears to have been treated with ceftriaxone. 2/27 yeast UTI, completed fluconazole, WBC remains normal this morning  -Fever on 2/27, CXR with bibasilar opacities, urine culture showed yeast and completed fluconazole. Blood cultures1/2 with staph epidermidis (likely contaminated) -Low-grade temp 100.2 as of 3/5 overnight, afebrile since. Closely monitor -Another temp up to 101.1 on the afternoon of 3/14-pan cultured-UA abnormal-CXR unremarkable and actually demonstrates improving bibasilar atelectasis; as of a.m. of 3/15 patient is afebrile -Abdominal ultrasound without evidence of acute cholecystitis -Repeat urine cx resulted 3/17 c/w MRSA sensitive to doxy which was initiated  Goals of care discussion  -with palliative team x2 last interaction on 3/7.  At that time despite being made aware of patient's poor status and poor prognosis her husband reported to the palliative provider that he was just not ready to let her go.  Breast husband did wish for PEG placement  -Husband is agreeable to outpatient palliative provider follow-up in the  event his wife further declines and would need referral to hospice care -Dtr in law states husband told by neurology that if pt were to improve and fully awaken it would take at least 2 months -Patient was admitted on 2/12 and has not made any meaningful recovery.  She continues to have significant discomfort relating to difficult to control spasticity and hypertonicity with pharmacotherapy limited by recent issues related to  transaminitis secondary to medications utilized for this issue.  Likely will need to have palliative medicine speak at length again with patient's husband and family on 2/12 since this is the benchmark that the husband is utilizing before he makes any further decisions about changing from aggressive care to a comfort care status.  Dysphagia/Nutrition Nutrition Status: Nutrition Problem: Inadequate oral intake Etiology: inability to eat Signs/Symptoms: NPO status Interventions: Tube feeding,Prostat Estimated body mass index is 31.24 kg/m as calculated from the following:   Height as of this encounter: 5\' 3"  (1.6 m).   Weight as of this encounter: 80 kg. -Remains lethargic with most recent SLP evaluation on 3/7 recommended continued n.p.o. status-continue cortrack for tube feedings - s/p PEG 3/14  CKD 3A- AKI due to dehydration  Right Sided Hydronephrosis  -Baseline creatinine ~ 1.0-1.3 -Creatinine has stabilized with increasing free water from 100 cc to 200 cc every 6 hours -Earlier in the hospitalization due to steady rise of creatinine arenal ultrasound completed whichshowed right sided hydronephrosis(no mass or definite calcification identified).  Nephrology, urology consulted and she is status post right nephrostomy tube placement by IR on 2/21.  -Has continued to have issues with urinary retention therefore Foley catheter placed overnight into 3/14 with significant amount of urine returned i.e. greater than 900 cc  Diabetes mellitus 2 on current long-term insulin with hyperglycemia -Continue on insulin sliding scale every 4 hours while she is on tube feeds -Continue Levemir 5 units daily along with NovoLog 2 units every 4 hours  -CBGs have been between 110 and 164 over the past 24 hours -HgbA1c = 7.6  Hypertension/chronic grade 2 diastolic heart failure -2D echoperformedduring this admission reveals grade 2 diastolic dysfunction -Remains euvolemic although overall net INO less  than 10,000 cc since admission -Continue Norvasc  Physical deconditioning/residual left hemiplegia from prior stroke PT eval 3/18:    Patient limited by lethargy and level of arousal. Patient able to keep eyes open for ~1 minute after cleaning of eyes. Patient grunting with pain and questions. Performed PROM of B UEs and LEs with unpurposeful resisting of movements. Continue to recommend SNF for ongoing Physical Therapy.       OT eval 3/17   Patient supine in bed with spouse at side, patient with eyes closed and lethargic throughout session.  Noted L UE elevated upon entry but not with palm guard on; R UE with palm guard but not elevated.  Therapist doffed splint to R UE, provided PROM to BUEs and hygiene to B hands (total assist).  Educated spouse on elevation of BUEs and splint use.  RN reports she elevated L UE this morning.  Reviewed splint schedule with RN and NT, educated to not over tighten splints and to keep UEs elevated- verbalized understanding. Updated whiteboard and will place schedule above head of bed. Will follow acutely.    SLP eval 3/17 Followed up for PO trials. Pt s/p PEG 3/14 for nutritional support. Lethargy continues to remain barrier to diet readiness. Xerostomia noted, improving post oral care by SLP. Pt assessed with single ice chips and  1/2 teaspoon of puree. Pt with reduced lingual movement/bolus coordination including some anterior spillage and delayed AP transit. Palpable swallows exhibited x3 with POs. No overt coughing however mentation remains poor. SLP to continue to monitor.   Other problems: Group B strep UTI/recent fevers -GBS UTI from 2/12 appears to have been treated with ceftriaxone. 2/27 yeast UTI, completed fluconazole, WBC remains normal this morning  -Fever on 2/27, CXR with bibasilar opacities, urine culture showed yeast and completed fluconazole. Blood cultures1/2 with staph epidermidis (likely contaminated) -3/15 see above regarding recurrence  of fevers and additional work-up  Intermittent agitation  -Has had issues with agitation when awake but for the most part remains lethargic -Continue liberalized visitation so family can sit with her  Gluteal skin breakdown -[place] and barrier cream -Continue air mattress to prevent further skin breakdown -turn as able Wound / Incision (Open or Dehisced) 03/27/20 Puncture Flank Right;Posterior nephrostomy tube placement insertion site (Active)  Date First Assessed/Time First Assessed: 03/27/20 1406   Wound Type: Puncture  Location: Flank  Location Orientation: Right;Posterior  Wound Description (Comments): nephrostomy tube placement insertion site  Present on Admission: No    Assessments 03/27/2020  8:09 AM 04/24/2020  8:10 PM  Dressing Type Gauze (Comment) Gauze (Comment)  Dressing Changed New Reinforced  Dressing Status Clean;Dry;Intact Clean;Dry;Intact  Dressing Change Frequency - Daily  Site / Wound Assessment Clean;Dry Clean;Dry  Closure - Sutures  Drainage Amount - Scant  Drainage Description - Serosanguineous     No Linked orders to display     Wound / Incision (Open or Dehisced) 04/17/20 Puncture Abdomen Left;Anterior;Upper G-tube insertion site  (Active)  Date First Assessed/Time First Assessed: 04/17/20 0915   Wound Type: Puncture  Location: Abdomen  Location Orientation: Left;Anterior;Upper  Wound Description (Comments): G-tube insertion site   Present on Admission: No    Assessments 04/17/2020  7:20 AM 04/24/2020  8:10 PM  Dressing Type Gauze (Comment);Tape dressing Gauze (Comment)  Dressing Changed New Changed  Dressing Status Clean;Dry;Intact Clean;Dry;Intact  Dressing Change Frequency PRN PRN  Site / Wound Assessment Dressing in place / Unable to assess Clean;Dry  Peri-wound Assessment Intact Intact  Drainage Amount None -  Treatment - Cleansed     No Linked orders to display    Hypothyroidism -Continue Synthroid per tube -TSH 6.651 on 2/12 and likely an acute  phase reactant consistent with sick euthyroid syndrome -TSH with T3 and T4 will need to be repeated in May 2022  Hyperkalemia Resolved -Potassium normal   Data Reviewed: Basic Metabolic Panel: Recent Labs  Lab 04/19/20 0421 04/20/20 0417 04/22/20 0055 04/23/20 0845 04/24/20 0458  NA 138 137 136 136 135  K 4.0 4.5 4.6 4.2 4.4  CL 102 98 100 98 98  CO2 29 29 30 29 28   GLUCOSE 179* 176* 137* 139* 178*  BUN 44* 40* 40* 36* 35*  CREATININE 1.08* 1.00 0.98 0.86 0.89  CALCIUM 9.6 9.7 9.4 9.4 9.4  MG  --   --  1.9 2.0  --    Liver Function Tests: Recent Labs  Lab 04/19/20 0421 04/20/20 0417 04/22/20 0055 04/24/20 0458  AST 65* 34 21 19  ALT 67* 45* 30 20  ALKPHOS 287* 277* 190* 144*  BILITOT 0.7 0.6 0.7 0.7  PROT 5.8* 6.2* 6.0* 5.6*  ALBUMIN 2.2* 2.3* 2.3* 2.1*   Recent Labs  Lab 04/19/20 0421  LIPASE 31   Recent Labs  Lab 04/22/20 0055  AMMONIA 12   CBC: Recent Labs  Lab 04/19/20 0421  04/20/20 0417 04/22/20 0055 04/23/20 0845 04/24/20 0458  WBC 5.3 5.4 5.8 5.7 5.8  NEUTROABS 3.4  --   --  3.7  --   HGB 10.3* 11.1* 10.9* 10.8* 10.3*  HCT 31.9* 34.5* 33.4* 33.1* 32.9*  MCV 99.7 98.6 97.9 98.2 99.4  PLT 250 286 299 289 267   Cardiac Enzymes: No results for input(s): CKTOTAL, CKMB, CKMBINDEX, TROPONINI in the last 168 hours. BNP (last 3 results) No results for input(s): BNP in the last 8760 hours.  ProBNP (last 3 results) No results for input(s): PROBNP in the last 8760 hours.  CBG: Recent Labs  Lab 04/24/20 1128 04/24/20 1614 04/24/20 1941 04/24/20 2303 04/25/20 0340  GLUCAP 94 146* 192* 141* 127*    Recent Results (from the past 240 hour(s))  Surgical pcr screen     Status: Abnormal   Collection Time: 04/16/20  7:28 PM   Specimen: Nasal Mucosa; Nasal Swab  Result Value Ref Range Status   MRSA, PCR NEGATIVE NEGATIVE Final   Staphylococcus aureus POSITIVE (A) NEGATIVE Final    Comment: (NOTE) The Xpert SA Assay (FDA approved for NASAL  specimens in patients 39 years of age and older), is one component of a comprehensive surveillance program. It is not intended to diagnose infection nor to guide or monitor treatment. Performed at [facility], [address], [place], [place] [address]   Culture, blood (routine x 2)     Status: None   Collection Time: 04/17/20  4:48 PM   Specimen: BLOOD  Result Value Ref Range Status   Specimen Description BLOOD LEFT ANTECUBITAL  Final   Special Requests   Final    BOTTLES DRAWN AEROBIC ONLY Blood Culture adequate volume   Culture   Final    NO GROWTH 5 DAYS Performed at [facility], [address] [place], [place] [address]    Report Status 04/22/2020 FINAL  Final  Culture, blood (routine x 2)     Status: Abnormal   Collection Time: 04/17/20  4:48 PM   Specimen: BLOOD RIGHT HAND  Result Value Ref Range Status   Specimen Description BLOOD RIGHT HAND  Final   Special Requests   Final    BOTTLES DRAWN AEROBIC ONLY Blood Culture adequate volume   Culture  Setup Time   Final    AEROBIC BOTTLE ONLY GRAM POSITIVE COCCI CRITICAL RESULT CALLED TO, READ BACK BY AND VERIFIED WITH: PHARMD THOMAS J.  [id number] [phone] FCP    Culture (A)  Final    STAPHYLOCOCCUS EPIDERMIDIS THE SIGNIFICANCE OF ISOLATING THIS ORGANISM FROM A SINGLE SET OF BLOOD CULTURES WHEN MULTIPLE SETS ARE DRAWN IS UNCERTAIN. PLEASE NOTIFY THE MICROBIOLOGY DEPARTMENT WITHIN ONE WEEK IF SPECIATION AND SENSITIVITIES ARE REQUIRED. Performed at [facility], [address], [place], [place] [address]    Report Status 04/21/2020 FINAL  Final  Blood Culture ID Panel (Reflexed)     Status: Abnormal   Collection Time: 04/17/20  4:48 PM  Result Value Ref Range Status   Enterococcus faecalis NOT DETECTED NOT DETECTED Final   Enterococcus Faecium NOT DETECTED NOT DETECTED Final   Listeria monocytogenes NOT DETECTED NOT DETECTED Final   Staphylococcus species DETECTED (A) NOT DETECTED Final    Comment: CRITICAL  RESULT CALLED TO, READ BACK BY AND VERIFIED WITH: PHARMD THOMAS J.  [phone] FCP    Staphylococcus aureus (BCID) NOT DETECTED NOT DETECTED Final   Staphylococcus epidermidis DETECTED (A) NOT DETECTED Final  Comment: Methicillin (oxacillin) resistant coagulase negative staphylococcus. Possible blood culture contaminant (unless isolated from more than one blood culture draw or clinical case suggests pathogenicity). No antibiotic treatment is indicated for blood  culture contaminants. CRITICAL RESULT CALLED TO, READ BACK BY AND VERIFIED WITH: PHARMD THOMAS J.  [phone] FCP    Staphylococcus lugdunensis NOT DETECTED NOT DETECTED Final   Streptococcus species NOT DETECTED NOT DETECTED Final   Streptococcus agalactiae NOT DETECTED NOT DETECTED Final   Streptococcus pneumoniae NOT DETECTED NOT DETECTED Final   Streptococcus pyogenes NOT DETECTED NOT DETECTED Final   A.calcoaceticus-baumannii NOT DETECTED NOT DETECTED Final   Bacteroides fragilis NOT DETECTED NOT DETECTED Final   Enterobacterales NOT DETECTED NOT DETECTED Final   Enterobacter cloacae complex NOT DETECTED NOT DETECTED Final   Escherichia coli NOT DETECTED NOT DETECTED Final   Klebsiella aerogenes NOT DETECTED NOT DETECTED Final   Klebsiella oxytoca NOT DETECTED NOT DETECTED Final   Klebsiella pneumoniae NOT DETECTED NOT DETECTED Final   Proteus species NOT DETECTED NOT DETECTED Final   Salmonella species NOT DETECTED NOT DETECTED Final   Serratia marcescens NOT DETECTED NOT DETECTED Final   Haemophilus influenzae NOT DETECTED NOT DETECTED Final   Neisseria meningitidis NOT DETECTED NOT DETECTED Final   Pseudomonas aeruginosa NOT DETECTED NOT DETECTED Final   Stenotrophomonas maltophilia NOT DETECTED NOT DETECTED Final   Candida albicans NOT DETECTED NOT DETECTED Final   Candida auris NOT DETECTED NOT DETECTED Final   Candida glabrata NOT DETECTED NOT DETECTED Final   Candida krusei NOT DETECTED NOT DETECTED Final    Candida parapsilosis NOT DETECTED NOT DETECTED Final   Candida tropicalis NOT DETECTED NOT DETECTED Final   Cryptococcus neoformans/gattii NOT DETECTED NOT DETECTED Final   Methicillin resistance mecA/C DETECTED (A) NOT DETECTED Final    Comment: CRITICAL RESULT CALLED TO, READ BACK BY AND VERIFIED WITH: Marissa NestleHARMD THOMAS J.  [phone] FCP Performed at [facility], [address] [place], [place] [address]   Culture, Urine     Status: Abnormal   Collection Time: 04/18/20  3:57 AM   Specimen: Urine, Catheterized  Result Value Ref Range Status   Specimen Description URINE, CATHETERIZED  Final   Special Requests   Final    NONE Performed at [facility], [address] [place], [place] [address]    Culture (A)  Final    80,000 COLONIES/mL METHICILLIN RESISTANT STAPHYLOCOCCUS AUREUS   Report Status 04/20/2020 FINAL  Final   Organism ID, Bacteria METHICILLIN RESISTANT STAPHYLOCOCCUS AUREUS (A)  Final      Susceptibility   Methicillin resistant staphylococcus aureus - MIC*    CIPROFLOXACIN >=8 RESISTANT Resistant     GENTAMICIN <=0.5 SENSITIVE Sensitive     NITROFURANTOIN <=16 SENSITIVE Sensitive     OXACILLIN >=4 RESISTANT Resistant     TETRACYCLINE <=1 SENSITIVE Sensitive     VANCOMYCIN <=0.5 SENSITIVE Sensitive     TRIMETH/SULFA <=10 SENSITIVE Sensitive     CLINDAMYCIN <=0.25 SENSITIVE Sensitive     RIFAMPIN <=0.5 SENSITIVE Sensitive     Inducible Clindamycin NEGATIVE Sensitive     * 80,000 COLONIES/mL METHICILLIN RESISTANT STAPHYLOCOCCUS AUREUS     Studies: DG Forearm Left  Result Date: 04/23/2020 CLINICAL DATA:  Left forearm pain. EXAM: LEFT FOREARM - 2 VIEW COMPARISON:  None. FINDINGS: There is no evidence of fracture or other focal bone lesions. Soft tissues are unremarkable. IMPRESSION: Negative. Electronically Signed   By: Romona Curlsyler  Litton M.D.   On: 04/23/2020 15:14  CT ANGIO CHEST PE W OR WO CONTRAST  Result Date: 04/23/2020 CLINICAL DATA:  Increased  lethargy. Artery of Percheron infarction. Tachypnea. EXAM: CT ANGIOGRAPHY CHEST WITH CONTRAST TECHNIQUE: Multidetector CT imaging of the chest was performed using the standard protocol during bolus administration of intravenous contrast. Multiplanar CT image reconstructions and MIPs were obtained to evaluate the vascular anatomy. CONTRAST:  OMNIPAQUE IOHEXOL 350 MG/ML SOLN COMPARISON:  Chest radiograph 04/22/2020 and CT chest from 03/25/2020 FINDINGS: Despite efforts by the technologist and patient, motion artifact is present on today's exam and could not be eliminated. This reduces exam sensitivity and specificity. Streak artifact from the patient's bilateral humeral implants also reduces to signal to noise ratio in a way that adversely impacts diagnostic sensitivity and specificity. Cardiovascular: No filling defect is identified in the pulmonary arterial tree to suggest pulmonary embolus. Coronary, aortic arch, and branch vessel atherosclerotic vascular disease. Mitral valve calcification. Mild aortic valve calcification. Mediastinum/Nodes: Unremarkable Lungs/Pleura: Mild dependent airspace opacity in the posterior basal segment right lower lobe probably from atelectasis. Linear subsegmental atelectasis or scarring in the posterior basal segment left lower lobe. Upper Abdomen: Unremarkable Musculoskeletal: Right proximal humeral prosthesis. Reverse left total shoulder prosthesis. Mild thoracic kyphosis. Thoracic spondylosis. Review of the MIP images confirms the above findings. IMPRESSION: 1. No filling defect is identified in the pulmonary arterial tree to suggest pulmonary embolus. 2. Coronary, aortic arch, and branch vessel atherosclerotic vascular disease. Mild aortic valve calcification. Mitral valve calcification. 3. Mild dependent airspace opacity in the posterior basal segment right lower lobe probably from atelectasis. 4. Linear subsegmental atelectasis or scarring in the posterior basal segment  left lower lobe. 5. Aortic atherosclerosis. Aortic Atherosclerosis (ICD10-I70.0). Electronically Signed   By: Gaylyn Rong M.D.   On: 04/23/2020 14:55   DG Shoulder Left  Result Date: 04/23/2020 CLINICAL DATA:  Left shoulder pain. EXAM: LEFT SHOULDER - 2+ VIEW COMPARISON:  None. FINDINGS: The patient is status post a left shoulder arthroplasty. There is no evidence of fracture or dislocation. Soft tissues are unremarkable. IMPRESSION: No acute osseous injury. Electronically Signed   By: Romona Curls M.D.   On: 04/23/2020 15:15   DG Humerus Left  Result Date: 04/23/2020 CLINICAL DATA:  Pain in the left arm. EXAM: LEFT HUMERUS - 2+ VIEW COMPARISON:  None. FINDINGS: The patient is status post a left shoulder arthroplasty. There is no evidence of fracture or other focal bone lesions. Soft tissues are unremarkable. IMPRESSION: Negative. Electronically Signed   By: Romona Curls M.D.   On: 04/23/2020 15:14   VAS [place] UPPER EXTREMITY VENOUS DUPLEX  Result Date: 04/24/2020 UPPER VENOUS STUDY  Indications: Pain, and Swelling Limitations: Body habitus and edema, inability to flex arm. Comparison Study: No prior study Performing Technologist: Sherren Kerns RVS  Examination Guidelines: A complete evaluation includes B-mode imaging, spectral Doppler, color Doppler, and power Doppler as needed of all accessible portions of each vessel. Bilateral testing is considered an integral part of a complete examination. Limited examinations for reoccurring indications may be performed as noted.  Right Findings: +----------+------------+---------+-----------+----------+-------+ RIGHT     CompressiblePhasicitySpontaneousPropertiesSummary +----------+------------+---------+-----------+----------+-------+ Subclavian               Yes       Yes                      +----------+------------+---------+-----------+----------+-------+  Left Findings:  +----------+------------+---------+-----------+----------+--------------+ LEFT      CompressiblePhasicitySpontaneousProperties   Summary     +----------+------------+---------+-----------+----------+--------------+ IJV  Full       Yes       Yes                             +----------+------------+---------+-----------+----------+--------------+ Subclavian    Full       Yes       Yes                             +----------+------------+---------+-----------+----------+--------------+ Axillary      Full       Yes       Yes                             +----------+------------+---------+-----------+----------+--------------+ Brachial      Full       Yes       Yes                             +----------+------------+---------+-----------+----------+--------------+ Radial        Full                                                 +----------+------------+---------+-----------+----------+--------------+ Ulnar                                               Not visualized +----------+------------+---------+-----------+----------+--------------+ Cephalic      None                 No                   Acute      +----------+------------+---------+-----------+----------+--------------+ Basilic       Full                                                 +----------+------------+---------+-----------+----------+--------------+  Summary:  Right: No evidence of thrombosis in the subclavian.  Left: No evidence of deep vein thrombosis in the upper extremity. Findings consistent with acute superficial vein thrombosis involving the left cephalic vein.  *See table(s) above for measurements and observations.  Diagnosing physician: Lemar Livings MD Electronically signed by Lemar Livings MD on 04/24/2020 at 11:19:08 AM.    Final     Scheduled Meds: . amLODipine  5 mg Per Tube Daily  . chlorhexidine  15 mL Mouth Rinse BID  . Chlorhexidine Gluconate Cloth  6 each  Topical Daily  . clopidogrel  75 mg Per Tube Daily  . feeding supplement (PROSource TF)  45 mL Per Tube TID  . free water  200 mL Per Tube Q6H  . heparin  5,000 Units Subcutaneous Q8H  . insulin aspart  0-9 Units Subcutaneous Q4H  . insulin aspart  2 Units Subcutaneous Q4H  . insulin detemir  5 Units Subcutaneous Daily  . levothyroxine  112 mcg Per Tube QAC breakfast  . liver oil-zinc oxide   Topical BID  . mouth rinse  15 mL Mouth Rinse q12n4p  .  methocarbamol  500 mg Per Tube BID  . modafinil  200 mg Per Tube Daily  . oxyCODONE  2.5 mg Per Tube Q8H  . rosuvastatin  2.5 mg Per Tube q1800  . sodium chloride flush  5 mL Intracatheter Q8H   Continuous Infusions: . sodium chloride Stopped (04/23/20 0049)  . feeding supplement (GLUCERNA 1.2 CAL) 1,000 mL (04/24/20 2103)    Principal Problem:   Acute encephalopathy Active Problems:   Diabetes mellitus (HCC)   Hypothyroidism   History of embolic stroke   Acute bilateral thalamic infarction (HCC)   Dysphagia   CKD (chronic kidney disease), symptom management only, stage 3 (moderate) (HCC)   Hydronephrosis   Group B streptococcal UTI   Uncontrolled type 2 diabetes mellitus with hyperglycemia, with long-term current use of insulin (HCC)   Chronic diastolic heart failure (HCC)   Essential hypertension   Physical debility   Left hemiplegia (HCC)   Palliative care by specialist   Goals of care, counseling/discussion   DNR (do not resuscitate)   Fever   Acute lower UTI   Tachypnea   Left upper extremity swelling   Consultants: Neurology  Palliative care Nephrology Urology IR  Procedures: Right percutaneous nephrostomy 2/21  Antibiotics: Anti-infectives (From admission, onward)   Start     Dose/Rate Route Frequency Ordered Stop   04/20/20 1000  doxycycline (VIBRAMYCIN) 100 mg in sodium chloride 0.9 % 250 mL IVPB        100 mg 125 mL/hr over 120 Minutes Intravenous Every 12 hours 04/20/20 0831 04/24/20 2350   04/17/20  0807  ceFAZolin (ANCEF) 2-4 GM/100ML-% IVPB       Note to Pharmacy: Cristy Friedlander   : cabinet override      04/17/20 0807 04/17/20 0847   04/07/20 2000  fluconazole (DIFLUCAN) 10 MG/ML suspension 100 mg        100 mg Per Tube Every evening 04/07/20 1236 04/08/20 2023   04/03/20 2000  fluconazole (DIFLUCAN) 40 MG/ML suspension 100 mg  Status:  Discontinued       "Followed by" Linked Group Details   100 mg Per Tube Every 24 hours 04/02/20 1752 04/07/20 1236   04/02/20 1900  fluconazole (DIFLUCAN) 40 MG/ML suspension 200 mg       "Followed by" Linked Group Details   200 mg Per Tube  Once 04/02/20 1752 04/02/20 2020   03/26/20 0730  cefTRIAXone (ROCEPHIN) 1 g in sodium chloride 0.9 % 100 mL IVPB        1 g 200 mL/hr over 30 Minutes Intravenous Every 24 hours 03/26/20 0644 03/30/20 0844   03/18/20 1900  azithromycin (ZITHROMAX) 500 mg in sodium chloride 0.9 % 250 mL IVPB  Status:  Discontinued        500 mg 250 mL/hr over 60 Minutes Intravenous Every 24 hours 03/18/20 1830 03/19/20 1106   03/18/20 1900  cefTRIAXone (ROCEPHIN) 2 g in sodium chloride 0.9 % 100 mL IVPB  Status:  Discontinued        2 g 200 mL/hr over 30 Minutes Intravenous Every 24 hours 03/18/20 1830 03/19/20 1106       Time spent: 30 minutes    Junious Silk ANP  [facility] 7 am - 330 pm/M-F for direct patient care and secure chat Please refer to Amion for contact info 38  days

## 2020-04-25 NOTE — Plan of Care (Signed)
  Problem: Education: Goal: Knowledge of General Education information will improve Description: Including pain rating scale, medication(s)/side effects and non-pharmacologic comfort measures Outcome: Progressing   Problem: Health Behavior/Discharge Planning: Goal: Ability to manage health-related needs will improve Outcome: Progressing   Problem: Clinical Measurements: Goal: Ability to maintain clinical measurements within normal limits will improve Outcome: Progressing Goal: Will remain free from infection Outcome: Progressing Goal: Diagnostic test results will improve Outcome: Progressing Goal: Respiratory complications will improve Outcome: Progressing Goal: Cardiovascular complication will be avoided Outcome: Progressing   Problem: Activity: Goal: Risk for activity intolerance will decrease Outcome: Progressing   Problem: Nutrition: Goal: Adequate nutrition will be maintained Outcome: Progressing   Problem: Coping: Goal: Level of anxiety will decrease Outcome: Progressing   Problem: Elimination: Goal: Will not experience complications related to bowel motility Outcome: Progressing Goal: Will not experience complications related to urinary retention Outcome: Progressing   Problem: Pain Managment: Goal: General experience of comfort will improve Outcome: Progressing   Problem: Safety: Goal: Ability to remain free from injury will improve Outcome: Progressing   Problem: Skin Integrity: Goal: Risk for impaired skin integrity will decrease Outcome: Progressing   Problem: Education: Goal: Knowledge of disease or condition will improve Outcome: Progressing Goal: Knowledge of secondary prevention will improve Outcome: Progressing Goal: Knowledge of patient specific risk factors addressed and post discharge goals established will improve Outcome: Progressing Goal: Individualized Educational Video(s) Outcome: Progressing   Problem: Coping: Goal: Will verbalize  positive feelings about self Outcome: Progressing   Problem: Health Behavior/Discharge Planning: Goal: Ability to manage health-related needs will improve Outcome: Progressing   

## 2020-04-26 DIAGNOSIS — G9340 Encephalopathy, unspecified: Secondary | ICD-10-CM | POA: Diagnosis not present

## 2020-04-26 LAB — COMPREHENSIVE METABOLIC PANEL
ALT: 16 U/L (ref 0–44)
AST: 26 U/L (ref 15–41)
Albumin: 2.2 g/dL — ABNORMAL LOW (ref 3.5–5.0)
Alkaline Phosphatase: 125 U/L (ref 38–126)
Anion gap: 9 (ref 5–15)
BUN: 38 mg/dL — ABNORMAL HIGH (ref 8–23)
CO2: 28 mmol/L (ref 22–32)
Calcium: 9.5 mg/dL (ref 8.9–10.3)
Chloride: 99 mmol/L (ref 98–111)
Creatinine, Ser: 0.95 mg/dL (ref 0.44–1.00)
GFR, Estimated: 59 mL/min — ABNORMAL LOW (ref >60)
Glucose, Bld: 147 mg/dL — ABNORMAL HIGH (ref 70–99)
Potassium: 5.8 mmol/L — ABNORMAL HIGH (ref 3.5–5.1)
Sodium: 136 mmol/L (ref 135–145)
Total Bilirubin: 0.7 mg/dL (ref 0.3–1.2)
Total Protein: 5.6 g/dL — ABNORMAL LOW (ref 6.5–8.1)

## 2020-04-26 LAB — GLUCOSE, CAPILLARY
Glucose-Capillary: 112 mg/dL — ABNORMAL HIGH (ref 70–99)
Glucose-Capillary: 143 mg/dL — ABNORMAL HIGH (ref 70–99)
Glucose-Capillary: 143 mg/dL — ABNORMAL HIGH (ref 70–99)
Glucose-Capillary: 146 mg/dL — ABNORMAL HIGH (ref 70–99)
Glucose-Capillary: 149 mg/dL — ABNORMAL HIGH (ref 70–99)
Glucose-Capillary: 180 mg/dL — ABNORMAL HIGH (ref 70–99)

## 2020-04-26 MED ORDER — ACETAMINOPHEN 325 MG PO TABS
650.0000 mg | ORAL_TABLET | Freq: Four times a day (QID) | ORAL | Status: DC
  Administered 2020-04-26 – 2020-05-01 (×22): 650 mg via NASOGASTRIC
  Filled 2020-04-26 (×22): qty 2

## 2020-04-26 MED ORDER — METHOCARBAMOL 500 MG PO TABS
500.0000 mg | ORAL_TABLET | Freq: Four times a day (QID) | ORAL | Status: DC
  Administered 2020-04-26 – 2020-04-27 (×8): 500 mg
  Filled 2020-04-26 (×7): qty 1

## 2020-04-26 NOTE — Progress Notes (Signed)
Occupational Therapy Treatment Patient Details Name: Hannah Fischer MRN: [id number] DOB: 03/15/1932 Today's Date: 04/26/2020    History of present illness Pt is an 87 y/o female admitted 03/18/20 with CVA. Imaging (+) for bilateral medial thalamic and punctate infarcts and L superior cerebellar infarcts. PEG placed 3/14. PMH significant for multiple CVAs with most recent ~3 months ago, residual L-sided hemiplegia with recent d/c from outpatient PT/OT with return to baseline level of function, DM, PE, HTN.   OT comments  Patient supine in bed, spouse at side.  Engaged in OT/PT session, total assist for bed mobility to EOB.  Seated EOB for pressure relief, posture and strengthening; worked on lateral leans, grooming tasks, and ROM of UEs.  Total assist given HOH for grooming (washing face, hands) but spontaneously bringing L hand towards mouth. Gentle ROM of UEs, but resistive to extension at times.  Keeps eyes closed during session, but following intermittent commands to initate return to midline. Donned R resting hand splint and L palm protector at end of session (B palm protectors on upon entry). Will follow acutely. SNF remains appropriate.    Follow Up Recommendations  SNF    Equipment Recommendations  Hospital bed    Recommendations for Other Services      Precautions / Restrictions Precautions Precautions: Fall Precaution Comments: nephrostomy drain, at risk for skin breakdown, PEG Restrictions Weight Bearing Restrictions: No       Mobility Bed Mobility Overal bed mobility: Needs Assistance Bed Mobility: Supine to Sit;Sit to Supine;Rolling Rolling: Total assist;+2 for physical assistance  Supine to sit: Total assist;HOB elevated Sit to supine: Total assist;+2 for physical assistance;+2 for safety/equipment   General bed mobility comments: utilized bedpad to pivot to EOB with total assist +2    Transfers Overall transfer level: Needs assistance         General  transfer comment: Deferred due to level of arousal    Balance Overall balance assessment: Needs assistance Sitting-balance support: Feet supported;Single extremity supported;No upper extremity supported Sitting balance-Leahy Scale: Poor Sitting balance - Comments: once positioned at EOB, pt able to progress to static sitting at min guard for safety/balance, worked on lateral leaning to L and R with pt able to initate movement from L lean into midline given assist to fully transition to midline                           ADL either performed or assessed with clinical judgement   ADL Overall ADL's : Needs assistance/impaired Eating/Feeding: NPO   Grooming: Wash/dry hands;Wash/dry face;Total assistance;Bed level;Sitting Grooming Details (indicate cue type and reason): supine and EOB sitting with assist, total assist with hand over hand support                               General ADL Comments: total assist at this time     Vision   Additional Comments: keeps eyes closed   Perception     Praxis      Cognition Arousal/Alertness: Lethargic Behavior During Therapy: Flat affect Overall Cognitive Status: Difficult to assess Area of Impairment: Attention;Following commands;Awareness;Problem solving                  Current Attention Level: Focused  Following Commands: Follows one step commands inconsistently;Follows one step commands with increased time  Awareness: Intellectual Problem Solving: Slow processing;Decreased initiation;Difficulty sequencing;Requires verbal cues;Requires tactile cues General Comments: pt  lethargic, keeping eyes closed and grimacing to stretching/passive movement           Shoulder Instructions       General Comments gentle PROM to B UEs, L sided painful to touch but improved with PROM. Re-donned L palm protector and R resting hand splint.    Pertinent Vitals/ Pain       Pain Assessment: Faces Faces Pain Scale:  Hurts little more Pain Location: generalized with all movement and stretching Pain Descriptors / Indicators: Grimacing;Guarding  Home Living                                          Prior Functioning/Environment              Frequency  Min 1X/week        Progress Toward Goals  OT Goals(current goals can now be found in the care plan section)  Progress towards OT goals: Progressing toward goals  Acute Rehab OT Goals Patient Stated Goal: Unable to state.  Plan Discharge plan remains appropriate;Frequency remains appropriate    Co-evaluation    PT/OT/SLP Co-Evaluation/Treatment: Yes Reason for Co-Treatment: Complexity of the patient's impairments (multi-system involvement);Necessary to address cognition/behavior during functional activity;For patient/therapist safety;To address functional/ADL transfers PT goals addressed during session: Mobility/safety with mobility;Balance;Strengthening/ROM OT goals addressed during session: ADL's and self-care      AM-PAC OT "6 Clicks" Daily Activity     Outcome Measure   Help from another person eating meals?: Total Help from another person taking care of personal grooming?: Total Help from another person toileting, which includes using toliet, bedpan, or urinal?: Total Help from another person bathing (including washing, rinsing, drying)?: Total Help from another person to put on and taking off regular upper body clothing?: Total Help from another person to put on and taking off regular lower body clothing?: Total 6 Click Score: 6    End of Session    OT Visit Diagnosis: Unsteadiness on feet (R26.81);Other abnormalities of gait and mobility (R26.89);Muscle weakness (generalized) (M62.81);Hemiplegia and hemiparesis;Other symptoms and signs involving cognitive function;Pain Hemiplegia - Right/Left: Right Hemiplegia - dominant/non-dominant: Dominant Hemiplegia - caused by: Cerebral infarction Pain - part of  body:  (generalized)   Activity Tolerance Patient limited by lethargy   Patient Left in bed;with call bell/phone within reach;with bed alarm set;with family/visitor present   Nurse Communication Mobility status        Time: 8891-6945 OT Time Calculation (min): 34 min  Charges: OT General Charges $OT Visit: 1 Visit OT Treatments $Self Care/Home Management : 8-22 mins  Barry Brunner, OT Acute Rehabilitation Services Pager [phone] Office [phone]    Chancy Milroy 04/26/2020, 1:00 PM

## 2020-04-26 NOTE — Progress Notes (Signed)
Orthopedic Tech Progress Note Patient Details:  Hannah Fischer Jun 14, 1932 [id number]  Ortho Devices Type of Ortho Device: Prafo boot/shoe Ortho Device/Splint Location: Bilateral Ortho Device/Splint Interventions: Ordered,Application,Adjustment   Post Interventions Patient Tolerated: Well RN called requesting bilateral PRAFO boots  Franziska Podgurski A Shimeka Bacot 04/26/2020, 12:58 PM

## 2020-04-26 NOTE — Progress Notes (Signed)
[facility] PROGRESS NOTE  Hannah Fischer IZT:[id number] DOB: [birth year] DOA: 03/18/2020 PCP: [name], MD  Status: Remains inpatient appropriate because:Altered mental status, Unsafe d/c plan and Inpatient level of care appropriate due to severity of illness   Dispo: The patient is from: Home              Anticipated d/c is to: SNF              Patient currently is medically stable to d/c.                Barriers to discharge: Patient has a limited amount of days on insurance for short-term SNF stay.  Family has subsequently confirmed that they will be able to pay for nursing facility services out-of-pocket once insurance benefits exhausted.  Discharge briefly delayed in context of transaminitis from medications used to treat hypertonicity.  LFTs have normalized and currently we are utilizing Robaxin which does not influence LFTs.  As of 3/23 [facility] confirmed that they will take the patient but they will not have a bed available until Tuesday, March 29.   Difficult to place patient Yes   Level of care: Progressive  Code Status: DNR Family Communication: 3/14 husband at bedside.  Attempted to contact patient's spouse by phone on 3/22 but did not answer and was sent to voicemail. 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 Shehad gone to bedin the morning her husbandhad trouble waking her up. In the ED she was noted to be responsive to pain. A code stroke was called. She was outside the window for TPA. An emergent CT did not show any acute abnormality. A CTA was performed and did not reveal any obvious stenosis. An MRI of the brain was performed on 2/13:Punctate acute infarction in the left superior cerebellumadjacent  to the superior cerebellar peduncle. Bilateral thalamicacute infarction extending back into the mid brain. This is consistent with artery of Percheron infarction.She continues to wax and wane from a mental status standpoint. She has cortrak. Palliative and neuro are following, family eventually decided for PEG which is now pending Plavix washout  Subjective: Resting.  Awakened but did not open eyes.  Remains nonverbal.  Appears to be having less pain and decreased hypertonicity in upper extremities with adjustments in musculoskeletal medications.  No family at bedside.  Objective: Vitals:   04/26/20 0332 04/26/20 0723  BP: (!) 136/58 125/64  Pulse: 75 78  Resp: 18 17  Temp: 97.8 F (36.6 C) 99.2 F (37.3 C)  SpO2: 96% 96%    Intake/Output Summary (Last 24 hours) at 04/26/2020 0818 Last data filed at 04/26/2020 3825 Gross per 24 hour  Intake --  Output 2650 ml  Net -2650 ml   Filed Weights   04/08/20 0423 04/26/20 0332  Weight: 80 kg 79.6 kg    Exam:  Constitutional: Lethargic, calm, no acute distress Respiratory: Lungs are clear.  Normal air Cardiovascular: Focal LUE edema has resolved as has erythema from IV infiltration Abdomen: PEG tube for feedings and medications, abdomen soft nontender with normoactive bowel sounds.  LBM 3/21 Genitourinary: Right nephrostomy tube with small volume blood-tinged mildly amber urine noted in collection bag-Foley with yellow urine to bedside bag with larger volume than nephrostomy bag. Neurologic: Cranial nerves II through XII intact. Minimal,purposeful movement RUE.Prafo  boot applied bilaterally by [employer] on 3/23 Psychiatric: Lethargic and nonverbal therefore unable to accurately assess orientation   Assessment/Plan: Acute problems: Bilateral Thalamic Infarcts Persistent acute metabolic encephalopathy  -due to CVA, mental status waxing and waning, at times she is minimally responsive versus talking some.  Neurology  following -Initially started on Provigil with gradual up titration but unfortunately developed tremulousness and dose was discontinued.   -She had also been given a trial of amantadine but this was discontinued.   -She has a prior history of embolic infarcts with imaging on admission showed punctate acute infarction in the left superior cerebellum adjacent to the superior cerebellar peduncle.  Bilateral thalamic acute infarction extending back into the midbrain, consistent with artery of Percheron infarction.    -She had an event with asymmetric pupils on 2/25, had a repeat MRI without any new acute findings and expected evolution of known infarcts.   EEG 2/13 with moderate diffuse encephalopathy no apparent underlying seizure activity -Continue Plavix  -3/22 resumed statin at half prior dose.  Was on Crestor 5 mg daily we will start at 2.5 mg and follow LFTs and uptitrate as tolerated.  Hypertonicity and spasticity secondary to recent brain injury from stroke -Patient having significant hypertonicity of lower extremities which preclude OT and PT working effectively with her -She is also having spasticity related tremors in the upper extremities also impeding ability to participate with therapy -In addition with severe hypertonicity appears to be causing the patient pain -Skelaxin and Tizanadine dc'd 2/2 transaminitis.  -3/23 AST and ALT remain normal so will increase Robaxin 500 mg frequency to [facility] q 6hrs-max dose allowed 8000 mg/24 hours -Cont low-dose OxyIR 2.5 mg q 8 hrs  Left upper extremity swelling -Patient with left upper extremity swelling without evidence of fracture on imaging -This was associated with tachypnea (likely secondary to ongoing pain syndrome) but as a precaution D-dimer was checked and was elevated-CTA of chest without evidence of PE -Venous duplex did reveal an acute SVT of the left cephalic vein at site of previous IV infiltration.  Recurrent urinary  retention -Required repeat I/O cath-foley inserted 3/14 w/ 900 cc returned. -Possibly recurred 2/2 meds which have been dc'd  Recent Group B strep UTI/recurrent fevers-acute MRSA UTI -GBS UTI from 2/12 appears to have been treated with ceftriaxone. 2/27 yeast UTI, completed fluconazole, WBC remains normal this morning  -Fever on 2/27, CXR with bibasilar opacities, urine culture showed yeast and completed fluconazole. Blood cultures1/2 with staph epidermidis (likely contaminated) -Low-grade temp 100.2 as of 3/5 overnight, afebrile since. Closely monitor -Another temp up to 101.1 on the afternoon of 3/14-pan cultured-UA abnormal-CXR unremarkable and actually demonstrates improving bibasilar atelectasis; as of a.m. of 3/15 patient is afebrile -Abdominal ultrasound without evidence of acute cholecystitis -Repeat urine cx resulted 3/17 c/w MRSA sensitive to doxy which was initiated  Goals of care discussion  -with palliative team x2 last interaction on 3/7.  At that time despite being made aware of patient's poor status and poor prognosis her husband reported to the palliative provider that he was just not ready to let her go. Husband did wish for PEG placement  -Husband is agreeable to outpatient palliative provider follow-up in the event his wife further declines and would need referral to hospice care -Dtr in law states husband told by neurology that if pt were to improve and fully awaken it would take at least 2 months -Patient was admitted on 2/12 and has not made any meaningful recovery.  She continues  to have significant discomfort relating to difficult to control spasticity and hypertonicity with pharmacotherapy limited by recent issues related to transaminitis secondary to medications utilized for this issue.  Likely will need to have palliative medicine speak at length again with patient's husband and family on 2/12 since this is the benchmark that the husband is utilizing before he makes any  further decisions about changing from aggressive care to a comfort care status.  Dysphagia/Nutrition Nutrition Status: Nutrition Problem: Inadequate oral intake Etiology: inability to eat Signs/Symptoms: NPO status Interventions: Tube feeding,Prostat Estimated body mass index is 31.09 kg/m as calculated from the following:   Height as of this encounter: 5' 3"  (1.6 m).   Weight as of this encounter: 79.6 kg. -Remains lethargic with most recent SLP evaluation on 3/7 recommended continued n.p.o. status-continue cortrack for tube feedings - s/p PEG 3/14  CKD 3A- AKI due to dehydration  Right Sided Hydronephrosis  -Baseline creatinine ~ 1.0-1.3 -Creatinine has stabilized with increasing free water from 100 cc to 200 cc every 6 hours -Earlier in the hospitalization due to steady rise of creatinine arenal ultrasound completed whichshowed right sided hydronephrosis(no mass or definite calcification identified).  Nephrology, urology consulted and she is status post right nephrostomy tube placement by IR on 2/21.  -3/14 Foley catheter placed secondary to significant urinary retention of greater than 900 cc.  Will need to discharge with this in place especially with need for skeletal muscle relaxants and patient's altered mentation.  Diabetes mellitus 2 on current long-term insulin with hyperglycemia -Continue on insulin sliding scale every 4 hours while she is on tube feeds -Continue Levemir 5 units daily along with NovoLog 2 units every 4 hours  -CBGs have been between 110 and 164 over the past 24 hours -HgbA1c = 7.6  Hypertension/chronic grade 2 diastolic heart failure -2D echoperformedduring this admission reveals grade 2 diastolic dysfunction -Remains euvolemic -Continue Norvasc  Physical deconditioning/residual left hemiplegia from prior stroke PT eval 3/23:     Pt making very slow progress towards physical therapy goals. Husband present this session and pt opened eyes for him  x2, as well as attempted to verbalize a few times to different questions. Pt demonstrated fair sitting balance this session and able to accept mild challenges vs resisting movement from therapist. She initiated trunk to neutral after weight bearing on L elbow for activity. Will continue to follow and progress as able per POC.     OT eval 3/23     Patient supine in bed, spouse at side.  Engaged in OT/PT session, total assist for bed mobility to EOB.  Seated EOB for pressure relief, posture and strengthening; worked on lateral leans, grooming tasks, and ROM of UEs.  Total assist given HOH for grooming (washing face, hands) but spontaneously bringing L hand towards mouth. Gentle ROM of UEs, but resistive to extension at times.  Keeps eyes closed during session, but following intermittent commands to initate return to midline. Donned R resting hand splint and L palm protector at end of session (B palm protectors on upon entry). Will follow acutely. SNF remains appropriate.       SLP eval 3/21 Pt continues to be minimally responsive. With total assist feeding of drips of water siphoned from a straw pt will seal lips of straw and swallow water. However, she never initiates sipping without total assist from SLP. After trials there is delayed weak coughing indicative of possible aspiration. Her arousal has been persistently insufficient since admit and pt has not met  therapy goals over the past month. Will sign off at this time. Please reorder if pts status changes.   Other problems: Group B strep UTI/recent fevers -GBS UTI from 2/12 appears to have been treated with ceftriaxone. 2/27 yeast UTI, completed fluconazole, WBC remains normal this morning  -Fever on 2/27, CXR with bibasilar opacities, urine culture showed yeast and completed fluconazole. Blood cultures1/2 with staph epidermidis (likely contaminated) -3/15 see above regarding recurrence of fevers and additional work-up  Intermittent  agitation  -Has had issues with agitation when awake but for the most part remains lethargic -Continue liberalized visitation so family can sit with her  Gluteal skin breakdown -[place] and barrier cream -Continue air mattress to prevent further skin breakdown -turn as able Wound / Incision (Open or Dehisced) 03/27/20 Puncture Flank Right;Posterior nephrostomy tube placement insertion site (Active)  Date First Assessed/Time First Assessed: 03/27/20 1406   Wound Type: Puncture  Location: Flank  Location Orientation: Right;Posterior  Wound Description (Comments): nephrostomy tube placement insertion site  Present on Admission: No    Assessments 03/27/2020  8:09 AM 04/26/2020 12:30 PM  Dressing Type Gauze (Comment) --  Dressing Changed New --  Dressing Status Clean;Dry;Intact Intact;Clean;Dry  Site / Wound Assessment Clean;Dry --     No Linked orders to display     Wound / Incision (Open or Dehisced) 04/17/20 Puncture Abdomen Left;Anterior;Upper G-tube insertion site  (Active)  Date First Assessed/Time First Assessed: 04/17/20 0915   Wound Type: Puncture  Location: Abdomen  Location Orientation: Left;Anterior;Upper  Wound Description (Comments): G-tube insertion site   Present on Admission: No    Assessments 04/17/2020  7:20 AM 04/26/2020  8:00 AM  Dressing Type Gauze (Comment);Tape dressing --  Dressing Changed New --  Dressing Status Clean;Dry;Intact Intact;Clean;Dry  Dressing Change Frequency PRN --  Site / Wound Assessment Dressing in place / Unable to assess --  Peri-wound Assessment Intact --  Drainage Amount None --     No Linked orders to display    Hypothyroidism -Continue Synthroid per tube -TSH 6.651 on 2/12 and likely an acute phase reactant consistent with sick euthyroid syndrome -TSH with T3 and T4 will need to be repeated in May 2022  Hyperkalemia Resolved -Potassium normal   Data Reviewed: Basic Metabolic Panel: Recent Labs  Lab 04/20/20 0417 04/22/20 0055  04/23/20 0845 04/24/20 0458 04/26/20 0453  NA 137 136 136 135 136  K 4.5 4.6 4.2 4.4 5.8*  CL 98 100 98 98 99  CO2 29 30 29 28 28   GLUCOSE 176* 137* 139* 178* 147*  BUN 40* 40* 36* 35* 38*  CREATININE 1.00 0.98 0.86 0.89 0.95  CALCIUM 9.7 9.4 9.4 9.4 9.5  MG  --  1.9 2.0  --   --    Liver Function Tests: Recent Labs  Lab 04/20/20 0417 04/22/20 0055 04/24/20 0458 04/26/20 0453  AST 34 21 19 26   ALT 45* 30 20 16   ALKPHOS 277* 190* 144* 125  BILITOT 0.6 0.7 0.7 0.7  PROT 6.2* 6.0* 5.6* 5.6*  ALBUMIN 2.3* 2.3* 2.1* 2.2*   No results for input(s): LIPASE, AMYLASE in the last 168 hours. Recent Labs  Lab 04/22/20 0055  AMMONIA 12   CBC: Recent Labs  Lab 04/20/20 0417 04/22/20 0055 04/23/20 0845 04/24/20 0458  WBC 5.4 5.8 5.7 5.8  NEUTROABS  --   --  3.7  --   HGB 11.1* 10.9* 10.8* 10.3*  HCT 34.5* 33.4* 33.1* 32.9*  MCV 98.6 97.9 98.2 99.4  PLT 286  299 289 267   Cardiac Enzymes: No results for input(s): CKTOTAL, CKMB, CKMBINDEX, TROPONINI in the last 168 hours. BNP (last 3 results) No results for input(s): BNP in the last 8760 hours.  ProBNP (last 3 results) No results for input(s): PROBNP in the last 8760 hours.  CBG: Recent Labs  Lab 04/25/20 1634 04/25/20 1940 04/25/20 2339 04/26/20 0330 04/26/20 0748  GLUCAP 144* 171* 130* 143* 112*    Recent Results (from the past 240 hour(s))  Surgical pcr screen     Status: Abnormal   Collection Time: 04/16/20  7:28 PM   Specimen: Nasal Mucosa; Nasal Swab  Result Value Ref Range Status   MRSA, PCR NEGATIVE NEGATIVE Final   Staphylococcus aureus POSITIVE (A) NEGATIVE Final    Comment: (NOTE) The Xpert SA Assay (FDA approved for NASAL specimens in patients 68 years of age and older), is one component of a comprehensive surveillance program. It is not intended to diagnose infection nor to guide or monitor treatment. Performed at [facility], [place] [address] [place], [place] [address]   Culture,  blood (routine x 2)     Status: None   Collection Time: 04/17/20  4:48 PM   Specimen: BLOOD  Result Value Ref Range Status   Specimen Description BLOOD LEFT ANTECUBITAL  Final   Special Requests   Final    BOTTLES DRAWN AEROBIC ONLY Blood Culture adequate volume   Culture   Final    NO GROWTH 5 DAYS Performed at [facility], [place] [address] [place], [place] [address]    Report Status 04/22/2020 FINAL  Final  Culture, blood (routine x 2)     Status: Abnormal   Collection Time: 04/17/20  4:48 PM   Specimen: BLOOD RIGHT HAND  Result Value Ref Range Status   Specimen Description BLOOD RIGHT HAND  Final   Special Requests   Final    BOTTLES DRAWN AEROBIC ONLY Blood Culture adequate volume   Culture  Setup Time   Final    AEROBIC BOTTLE ONLY GRAM POSITIVE COCCI CRITICAL RESULT CALLED TO, READ BACK BY AND VERIFIED WITH: Barnes J.  [id number] [phone] FCP    Culture (A)  Final    STAPHYLOCOCCUS EPIDERMIDIS THE SIGNIFICANCE OF ISOLATING THIS ORGANISM FROM A SINGLE SET OF BLOOD CULTURES WHEN MULTIPLE SETS ARE DRAWN IS UNCERTAIN. PLEASE NOTIFY THE MICROBIOLOGY DEPARTMENT WITHIN ONE WEEK IF SPECIATION AND SENSITIVITIES ARE REQUIRED. Performed at [facility], [place] [address], [place], [place] [address]    Report Status 04/21/2020 FINAL  Final  Blood Culture ID Panel (Reflexed)     Status: Abnormal   Collection Time: 04/17/20  4:48 PM  Result Value Ref Range Status   Enterococcus faecalis NOT DETECTED NOT DETECTED Final   Enterococcus Faecium NOT DETECTED NOT DETECTED Final   Listeria monocytogenes NOT DETECTED NOT DETECTED Final   Staphylococcus species DETECTED (A) NOT DETECTED Final    Comment: CRITICAL RESULT CALLED TO, READ BACK BY AND VERIFIED WITH: PHARMD THOMAS J.  [phone] FCP    Staphylococcus aureus (BCID) NOT DETECTED NOT DETECTED Final   Staphylococcus epidermidis DETECTED (A) NOT DETECTED Final    Comment: Methicillin (oxacillin) resistant coagulase negative  staphylococcus. Possible blood culture contaminant (unless isolated from more than one blood culture draw or clinical case suggests pathogenicity). No antibiotic treatment is indicated for blood  culture contaminants. CRITICAL RESULT CALLED TO, READ BACK BY AND VERIFIED WITH: Gracy Bruins  [id number] [phone] FCP  Staphylococcus lugdunensis NOT DETECTED NOT DETECTED Final   Streptococcus species NOT DETECTED NOT DETECTED Final   Streptococcus agalactiae NOT DETECTED NOT DETECTED Final   Streptococcus pneumoniae NOT DETECTED NOT DETECTED Final   Streptococcus pyogenes NOT DETECTED NOT DETECTED Final   A.calcoaceticus-baumannii NOT DETECTED NOT DETECTED Final   Bacteroides fragilis NOT DETECTED NOT DETECTED Final   Enterobacterales NOT DETECTED NOT DETECTED Final   Enterobacter cloacae complex NOT DETECTED NOT DETECTED Final   Escherichia coli NOT DETECTED NOT DETECTED Final   Klebsiella aerogenes NOT DETECTED NOT DETECTED Final   Klebsiella oxytoca NOT DETECTED NOT DETECTED Final   Klebsiella pneumoniae NOT DETECTED NOT DETECTED Final   Proteus species NOT DETECTED NOT DETECTED Final   Salmonella species NOT DETECTED NOT DETECTED Final   Serratia marcescens NOT DETECTED NOT DETECTED Final   Haemophilus influenzae NOT DETECTED NOT DETECTED Final   Neisseria meningitidis NOT DETECTED NOT DETECTED Final   Pseudomonas aeruginosa NOT DETECTED NOT DETECTED Final   Stenotrophomonas maltophilia NOT DETECTED NOT DETECTED Final   Candida albicans NOT DETECTED NOT DETECTED Final   Candida auris NOT DETECTED NOT DETECTED Final   Candida glabrata NOT DETECTED NOT DETECTED Final   Candida krusei NOT DETECTED NOT DETECTED Final   Candida parapsilosis NOT DETECTED NOT DETECTED Final   Candida tropicalis NOT DETECTED NOT DETECTED Final   Cryptococcus neoformans/gattii NOT DETECTED NOT DETECTED Final   Methicillin resistance mecA/C DETECTED (A) NOT DETECTED Final    Comment: CRITICAL RESULT CALLED TO,  READ BACK BY AND VERIFIED WITH: Gracy Bruins  [id number] [phone] FCP Performed at [facility], [address] [place], [place] [address]   Culture, Urine     Status: Abnormal   Collection Time: 04/18/20  3:57 AM   Specimen: Urine, Catheterized  Result Value Ref Range Status   Specimen Description URINE, CATHETERIZED  Final   Special Requests   Final    NONE Performed at [facility], [place] [address], [place], [place] [address]    Culture (A)  Final    80,000 COLONIES/mL METHICILLIN RESISTANT STAPHYLOCOCCUS AUREUS   Report Status 04/20/2020 FINAL  Final   Organism ID, Bacteria METHICILLIN RESISTANT STAPHYLOCOCCUS AUREUS (A)  Final      Susceptibility   Methicillin resistant staphylococcus aureus - MIC*    CIPROFLOXACIN >=8 RESISTANT Resistant     GENTAMICIN <=0.5 SENSITIVE Sensitive     NITROFURANTOIN <=16 SENSITIVE Sensitive     OXACILLIN >=4 RESISTANT Resistant     TETRACYCLINE <=1 SENSITIVE Sensitive     VANCOMYCIN <=0.5 SENSITIVE Sensitive     TRIMETH/SULFA <=10 SENSITIVE Sensitive     CLINDAMYCIN <=0.25 SENSITIVE Sensitive     RIFAMPIN <=0.5 SENSITIVE Sensitive     Inducible Clindamycin NEGATIVE Sensitive     * 80,000 COLONIES/mL METHICILLIN RESISTANT STAPHYLOCOCCUS AUREUS     Studies: No results found.  Scheduled Meds: . acetaminophen  650 mg Per NG tube Q6H  . amLODipine  5 mg Per Tube Daily  . chlorhexidine  15 mL Mouth Rinse BID  . Chlorhexidine Gluconate Cloth  6 each Topical Daily  . clopidogrel  75 mg Per Tube Daily  . feeding supplement (PROSource TF)  45 mL Per Tube TID  . free water  200 mL Per Tube Q6H  . heparin  5,000 Units Subcutaneous Q8H  . insulin aspart  0-9 Units Subcutaneous Q4H  . insulin aspart  2 Units Subcutaneous Q4H  . insulin detemir  5 Units Subcutaneous Daily  .  levothyroxine  112 mcg Per Tube QAC breakfast  . liver oil-zinc oxide   Topical BID  . mouth rinse  15 mL Mouth Rinse q12n4p  . methocarbamol  500 mg Per Tube QID   . modafinil  200 mg Per Tube Daily  . oxyCODONE  2.5 mg Per Tube Q8H  . rosuvastatin  2.5 mg Per Tube q1800  . sodium chloride flush  5 mL Intracatheter Q8H   Continuous Infusions: . sodium chloride Stopped (04/23/20 0049)  . feeding supplement (GLUCERNA 1.2 CAL) 1,000 mL (04/25/20 2241)    Principal Problem:   Acute encephalopathy Active Problems:   Diabetes mellitus ([place])   Hypothyroidism   History of embolic stroke   Acute bilateral thalamic infarction ([place])   Dysphagia   CKD (chronic kidney disease), symptom management only, stage 3 (moderate) (H[facility])   Hydronephrosis   Group B streptococcal UTI   Uncontrolled type 2 diabetes mellitus with hyperglycemia, with long-term current use of insulin (H[facility])   Chronic diastolic heart failure ([facility])   Essential hypertension   Physical debility   Left hemiplegia ([place])   Palliative care by specialist   Goals of care, counseling/discussion   DNR (do not resuscitate)   Fever   Acute lower UTI   Tachypnea   Left upper extremity swelling   Altered mental status   Consultants: Neurology  Palliative care Nephrology Urology IR  Procedures: Right percutaneous nephrostomy 2/21  Antibiotics: Anti-infectives (From admission, onward)   Start     Dose/Rate Route Frequency Ordered Stop   04/20/20 1000  doxycycline (VIBRAMYCIN) 100 mg in sodium chloride 0.9 % 250 mL IVPB        100 mg 125 mL/hr over 120 Minutes Intravenous Every 12 hours 04/20/20 0831 04/24/20 2350   04/17/20 0807  ceFAZolin (ANCEF) 2-4 GM/100ML-% IVPB       Note to Pharmacy: Domenick Bookbinder   : cabinet override      04/17/20 0807 04/17/20 0847   04/07/20 2000  fluconazole (DIFLUCAN) 10 MG/ML suspension 100 mg        100 mg Per Tube Every evening 04/07/20 1236 04/08/20 2023   04/03/20 2000  fluconazole (DIFLUCAN) 40 MG/ML suspension 100 mg  Status:  Discontinued       "Followed by" Linked Group Details   100 mg Per Tube Every 24 hours 04/02/20 1752 04/07/20 1236    04/02/20 1900  fluconazole (DIFLUCAN) 40 MG/ML suspension 200 mg       "Followed by" Linked Group Details   200 mg Per Tube  Once 04/02/20 1752 04/02/20 2020   03/26/20 0730  cefTRIAXone (ROCEPHIN) 1 g in sodium chloride 0.9 % 100 mL IVPB        1 g 200 mL/hr over 30 Minutes Intravenous Every 24 hours 03/26/20 0644 03/30/20 0844   03/18/20 1900  azithromycin (ZITHROMAX) 500 mg in sodium chloride 0.9 % 250 mL IVPB  Status:  Discontinued        500 mg 250 mL/hr over 60 Minutes Intravenous Every 24 hours 03/18/20 1830 03/19/20 1106   03/18/20 1900  cefTRIAXone (ROCEPHIN) 2 g in sodium chloride 0.9 % 100 mL IVPB  Status:  Discontinued        2 g 200 mL/hr over 30 Minutes Intravenous Every 24 hours 03/18/20 1830 03/19/20 1106       Time spent: 20 minutes    Erin Hearing ANP  [facility] 7 am - 330 pm/M-F for direct patient care and secure chat Please refer to  Amion for contact info 39  days

## 2020-04-26 NOTE — Progress Notes (Signed)
Physical Therapy Treatment Patient Details Name: Hannah Fischer MRN: [id number] DOB: [birth year] Today's Date: 04/26/2020    History of Present Illness Pt is an 87 y/o female admitted 03/18/20 with CVA. Imaging (+) for bilateral medial thalamic and punctate infarcts and L superior cerebellar infarcts. PEG placed 3/14. PMH significant for multiple CVAs with most recent ~3 months ago, residual L-sided hemiplegia with recent d/c from outpatient PT/OT with return to baseline level of function, DM, PE, HTN.    PT Comments    Pt making very slow progress towards physical therapy goals. Husband present this session and pt opened eyes for him x2, as well as attempted to verbalize a few times to different questions. Pt demonstrated fair sitting balance this session and able to accept mild challenges vs resisting movement from therapist. She initiated trunk to neutral after weight bearing on L elbow for activity. Will continue to follow and progress as able per POC.     Follow Up Recommendations  SNF;Supervision/Assistance - 24 hour     Equipment Recommendations  Wheelchair cushion (measurements PT);Wheelchair (measurements PT);Hospital bed    Recommendations for Other Services       Precautions / Restrictions Precautions Precautions: Fall Precaution Comments: nephrostomy drain, at risk for skin breakdown, PEG Restrictions Weight Bearing Restrictions: No    Mobility  Bed Mobility Overal bed mobility: Needs Assistance Bed Mobility: Supine to Sit;Sit to Supine;Rolling Rolling: Total assist;+2 for physical assistance   Supine to sit: Total assist;HOB elevated Sit to supine: Total assist;+2 for physical assistance;+2 for safety/equipment   General bed mobility comments: utilized bedpad to pivot to EOB with total assist +2    Transfers                 General transfer comment: Deferred due to level of arousal  Ambulation/Gait             General Gait Details: Unable to  attempt this session.   Stairs             Wheelchair Mobility    Modified Rankin (Stroke Patients Only) Modified Rankin (Stroke Patients Only) Pre-Morbid Rankin Score: No significant disability Modified Rankin: Severe disability     Balance Overall balance assessment: Needs assistance Sitting-balance support: Feet supported;Single extremity supported;No upper extremity supported Sitting balance-Leahy Scale: Poor Sitting balance - Comments: once positioned at EOB, pt able to progress to static sitting at min guard for safety/balance, worked on lateral leaning to L and R with pt able to initate movement from L lean into midline given assist to fully transition to midline                                    Cognition Arousal/Alertness: Lethargic Behavior During Therapy: Flat affect Overall Cognitive Status: Difficult to assess Area of Impairment: Attention;Following commands;Awareness;Problem solving                 Orientation Level: Disoriented to;Time;Situation;Person;Place Current Attention Level: Focused Memory: Decreased short-term memory;Decreased recall of precautions Following Commands: Follows one step commands inconsistently;Follows one step commands with increased time Safety/Judgement: Decreased awareness of safety;Decreased awareness of deficits Awareness: Intellectual Problem Solving: Slow processing;Decreased initiation;Difficulty sequencing;Requires verbal cues;Requires tactile cues General Comments: pt lethargic, keeping eyes closed and grimacing to stretching/passive movement      Exercises Other Exercises Other Exercises: L cervical rotation to neutral 3x ~10-20 sec each Other Exercises: Pec stretch with cervical extension to neutral  x30" Other Exercises: Cervical extension to neutral x6 throughout session in differing positions.    General Comments General comments (skin integrity, edema, etc.): gentle PROM to B UEs, L sided  painful to touch but improved with PROM. Re-donned L palm protector and R resting hand splint.      Pertinent Vitals/Pain Pain Assessment: Faces Faces Pain Scale: Hurts little more Pain Location: generalized with all movement and stretching Pain Descriptors / Indicators: Grimacing;Guarding Pain Intervention(s): Limited activity within patient's tolerance;Monitored during session;Repositioned    Home Living                      Prior Function            PT Goals (current goals can now be found in the care plan section) Acute Rehab PT Goals Patient Stated Goal: Unable to state. PT Goal Formulation: Patient unable to participate in goal setting Time For Goal Achievement: 05/01/20 Potential to Achieve Goals: Poor Progress towards PT goals: Progressing toward goals    Frequency    Min 2X/week      PT Plan Frequency needs to be updated    Co-evaluation PT/OT/SLP Co-Evaluation/Treatment: Yes Reason for Co-Treatment: Complexity of the patient's impairments (multi-system involvement);Necessary to address cognition/behavior during functional activity;For patient/therapist safety;To address functional/ADL transfers PT goals addressed during session: Mobility/safety with mobility;Balance;Strengthening/ROM OT goals addressed during session: ADL's and self-care      AM-PAC PT "6 Clicks" Mobility   Outcome Measure  Help needed turning from your back to your side while in a flat bed without using bedrails?: Total Help needed moving from lying on your back to sitting on the side of a flat bed without using bedrails?: Total Help needed moving to and from a bed to a chair (including a wheelchair)?: Total Help needed standing up from a chair using your arms (e.g., wheelchair or bedside chair)?: Total Help needed to walk in hospital room?: Total Help needed climbing 3-5 steps with a railing? : Total 6 Click Score: 6    End of Session   Activity Tolerance: Patient limited by  lethargy Patient left: with call bell/phone within reach;in bed;with family/visitor present Nurse Communication: Mobility status PT Visit Diagnosis: Other symptoms and signs involving the nervous system (R29.898);Difficulty in walking, not elsewhere classified (R26.2);Unsteadiness on feet (R26.81);Muscle weakness (generalized) (M62.81)     Time: 2376-2831 PT Time Calculation (min) (ACUTE ONLY): 26 min  Charges:  $Therapeutic Activity: 8-22 mins                     Conni Slipper, PT, DPT Acute Rehabilitation Services Pager: [phone] Office: [phone]    Marylynn Pearson 04/26/2020, 1:15 PM

## 2020-04-26 NOTE — TOC Progression Note (Signed)
Transition of Care [facility]) - Progression Note    Patient Details  Name: Hannah Fischer MRN: [id number] Date of Birth: April 22, 1932  [facility] CM/SW Contact  Janae Bridgeman, RN Phone Number: 04/26/2020, 8:21 AM  Clinical Narrative:    Case management spoke with Junious Silk, NP this morning regarding patient's progression to SNF facility.  Rennis Harding, NP is making adjustments to patient's medications at this time and she states that once the patient's laboratory studies are stable for disposition - CM/ MSW will restart insurance authorization for SNF / LTC placement as appropriate for the patient at this time.  CM and MSW will continue to follow the patient for disposition to [facility] in [place].  The patient's family is agreeable to pay for LTC placement once the patient's Medicare is exhausted for admission to the facility if needed.   Expected Discharge Plan: Skilled Nursing Facility Barriers to Discharge: Continued Medical Work up (Patient scheduled for PEG placement on 04/17/2020)  Expected Discharge Plan and Services Expected Discharge Plan: Skilled Nursing Facility In-house Referral: Clinical Social Work,Hospice / Palliative Care Discharge Planning Services: CM Consult Post Acute Care Choice: Skilled Nursing Facility Living arrangements for the past 2 months: Skilled Nursing Facility                                       Social Determinants of Health (SDOH) Interventions    Readmission Risk Interventions Readmission Risk Prevention Plan 04/13/2020  Transportation Screening Complete  Medication Review [facility]) Complete  PCP or Specialist appointment within 3-5 days of discharge Complete  HRI or Home Care Consult Complete  SW Recovery Care/Counseling Consult Complete  Palliative Care Screening Complete  Skilled Nursing Facility Complete  Some recent data might be hidden

## 2020-04-26 NOTE — TOC Progression Note (Signed)
Transition of Care [facility]) - Progression Note    Patient Details  Name: Hannah Fischer MRN: [id number] Date of Birth: [birth year]  Transition of Care [facility]) CM/SW Contact  Janae Bridgeman, RN Phone Number: 04/26/2020, 1:00 PM  Clinical Narrative:    Case management spoke with [facility] SNF and the facility will be unable to admit the patient to the facility until Monday, 3/28 or Tuesday 3/29 since the facility was full.  I spoke with Roanna Epley, CM at [facility] and they will be able to provide the patient with oral antibiotics for MRSA UTI and follow patient's urine cultures and treatment along with liver function test monitoring and needed medication adjustments or changes.  CM will start insurance authorization on Friday, 04/28/2020 for pending admission for Monday or Tuesday of next week.  Expected Discharge Plan: Skilled Nursing Facility Barriers to Discharge: Continued Medical Work up (Patient scheduled for PEG placement on 04/17/2020)  Expected Discharge Plan and Services Expected Discharge Plan: Skilled Nursing Facility In-house Referral: Clinical Social Work,Hospice / Palliative Care Discharge Planning Services: CM Consult Post Acute Care Choice: Skilled Nursing Facility Living arrangements for the past 2 months: Skilled Nursing Facility                                       Social Determinants of Health (SDOH) Interventions    Readmission Risk Interventions Readmission Risk Prevention Plan 04/13/2020  Transportation Screening Complete  Medication Review [facility]) Complete  PCP or Specialist appointment within 3-5 days of discharge Complete  HRI or Home Care Consult Complete  SW Recovery Care/Counseling Consult Complete  Palliative Care Screening Complete  Skilled Nursing Facility Complete  Some recent data might be hidden

## 2020-04-27 LAB — GLUCOSE, CAPILLARY
Glucose-Capillary: 145 mg/dL — ABNORMAL HIGH (ref 70–99)
Glucose-Capillary: 111 mg/dL — ABNORMAL HIGH (ref 70–99)
Glucose-Capillary: 168 mg/dL — ABNORMAL HIGH (ref 70–99)
Glucose-Capillary: 162 mg/dL — ABNORMAL HIGH (ref 70–99)
Glucose-Capillary: 175 mg/dL — ABNORMAL HIGH (ref 70–99)
Glucose-Capillary: 152 mg/dL — ABNORMAL HIGH (ref 70–99)

## 2020-04-27 NOTE — Progress Notes (Signed)
Nutrition Follow-up  DOCUMENTATION CODES:   Not applicable  INTERVENTION:  Continue TF via PEG:  Glucerna 1.2 at 45 ml/h (1034m)  Prosource TF 45 ml TID  Free water flushes 200 ml every 6 hours   Provides 1416 kcal, 93 gm protein, 1669 ml total free water daily   NUTRITION DIAGNOSIS:   Inadequate oral intake related to inability to eat as evidenced by NPO status.  Ongoing   GOAL:   Patient will meet greater than or equal to 90% of their needs  Met with TF  MONITOR:   Diet advancement,TF tolerance  REASON FOR ASSESSMENT:   Consult Enteral/tube feeding initiation and management  ASSESSMENT:   Pt with PMH of HTN, HLD, Dm, GERD, COVID 02/2018, PE, IBS, cerebral embolic strokes 151/76with residula L hand weakness now admitted with bilateral thalamic infarct extending back to the midbrain c/w [place] stroke.  2/14 Cortrak placed (gastric) 2/19 ultrasound revealed hydronephrosis, R worse that L and chronic R UPJ obstruction. 2/21 R percutaneous nephrostomy drain placement in IR. 3/1 TF held due to [place] dis[place] 3/2 Cortrak repositioned; gastric tip 3/14 s/p PEG placement  Discussed pt with RN. Pt unresponsive to RD voice/touch throughout assessment. Pt receiving TF via PEG. Current regimen: Glucerna 1.2 at 45 ml/hr with Prosource TF 45 ml TID and 2052mfree water flushes Q6H. Tolerating well per RN.   UOP: 185050mocumented x24 hours  Admit wt: 78.9 kg Current wt: 79.6 kg  Medications: SSI Q4H, 2 units novolog Q4H, 5 units levemir daily Labs: K+ 5.8 (H; has been WNL) CBGs [id number] Coordinator following)  Diet Order:   Diet Order    None      EDUCATION NEEDS:   No education needs have been identified at this time  Skin:  Skin Assessment: Reviewed RN Assessment  Last BM:  3/23  Height:   Ht Readings from Last 1 Encounters:  03/18/20 5' 3"  (1.6 m)    Weight:   Wt Readings from Last 1 Encounters:  04/26/20 79.6 kg     Ideal Body Weight:  52.2 kg  BMI:  Body mass index is 31.09 kg/m.  Estimated Nutritional Needs:   Kcal:  1400-1600  Protein:  85-100 grams  Fluid:  > 1.5 L/day   AmaLarkin InaS, RD, LDN RD pager number and weekend/on-call pager number located in [place]

## 2020-04-27 NOTE — Progress Notes (Signed)
[facility] PROGRESS NOTE  Hannah Fischer JOI:[id number] DOB: 1932/07/10 DOA: 03/18/2020 PCP: [name], MD         04/27/2020     Status: Remains inpatient appropriate because:Altered mental status, Unsafe d/c plan and Inpatient level of care appropriate due to severity of illness   Dispo: The patient is from: Home              Anticipated d/c is to: SNF              Patient currently is medically stable to d/c.                Barriers to discharge: Patient has a limited amount of days on insurance for short-term SNF stay.  Family has subsequently confirmed that they will be able to pay for nursing facility services out-of-pocket once insurance benefits exhausted.  Discharge briefly delayed in context of transaminitis from medications used to treat hypertonicity.  LFTs have normalized and currently we are utilizing Robaxin which does not influence LFTs.  As of 3/23 [facility] confirmed that they will take the patient but they will not have a bed available until Tuesday, March 29.   Difficult to place patient Yes   Level of care: Progressive  Code Status: DNR Family Communication: 3/14 husband at bedside.  Attempted to contact patient's spouse by phone on 3/22 but did not answer and was sent to voicemail. 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 Shehad gone to bedin the morning her husbandhad trouble waking her up. In the ED she was noted to be responsive to pain. A code stroke was called. She was outside the window for TPA. An emergent CT did not show any acute abnormality. A CTA was performed and did not reveal any obvious stenosis. An MRI of the brain was performed on 2/13:Punctate acute infarction in the left  superior cerebellumadjacent to the superior cerebellar peduncle. Bilateral thalamicacute infarction extending back into the mid brain. This is consistent with artery of Percheron infarction.She continues to wax and wane from a mental status standpoint. She has cortrak. Palliative and neuro are following, family eventually decided for PEG which is now pending Plavix washout  Subjective: Patient awakens by grimacing but does not open eyes or otherwise participate in any meaningful interaction.  Objective: Vitals:   04/27/20 0355 04/27/20 0739  BP: 130/70 (!) 144/67  Pulse: 88 79  Resp: 18 18  Temp: 98.8 F (37.1 C) 98.3 F (36.8 C)  SpO2: 96% 97%    Intake/Output Summary (Last 24 hours) at 04/27/2020 4709 Last data filed at 04/27/2020 0355 Gross per 24 hour  Intake --  Output 1850 ml  Net -1850 ml   Filed Weights   04/26/20 0332  Weight: 79.6 kg    Exam:  Constitutional: Calm, no acute distressingly stimulating in extremities Respiratory: Lungs are clear to auscultation and she is stable on room air Cardiovascular: Focal LUE edema has resolved as has erythema from IV infiltration Abdomen: PEG tube for feedings, LBM 3/21 Genitourinary: Right nephrostomy tube with small volume blood-tinged mildly amber urine noted in collection bag-Foley with yellow urine to bedside bag with larger volume than nephrostomy bag. Neurologic: Cranial nerves II through XII intact. Minimal,purposeful movement RUE.Prafo boot applied bilaterally by [employer] on 3/23 Psychiatric: Does not fully awaken  during my interaction.  Unable to accurately assess orientation   Assessment/Plan: Acute problems: Bilateral Thalamic Infarcts Persistent acute metabolic encephalopathy  -due to CVA, mental status waxing and waning, at times she is minimally responsive versus talking some.  Neurology following -Initially started on Provigil with gradual up titration but unfortunately developed tremulousness and dose  was discontinued.   -She had also been given a trial of amantadine but this was discontinued.   -She has a prior history of embolic infarcts with imaging on admission showed punctate acute infarction in the left superior cerebellum adjacent to the superior cerebellar peduncle.  Bilateral thalamic acute infarction extending back into the midbrain, consistent with artery of Percheron infarction.    -She had an event with asymmetric pupils on 2/25, had a repeat MRI without any new acute findings and expected evolution of known infarcts.   EEG 2/13 with moderate diffuse encephalopathy no apparent underlying seizure activity -Continue Plavix  -3/22 resumed statin at half prior dose.  Was on Crestor 5 mg daily we will start at 2.5 mg and follow LFTs and uptitrate as tolerated. 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  -3/23 AST and ALT remain normal so will increase Robaxin 500 mg frequency to [facility] q 6hrs-max dose allowed 8000 mg/24 hours -Cont low-dose OxyIR 2.5 mg q 8 hrs  Left upper extremity swelling -Patient with left upper extremity swelling without evidence of fracture on imaging -This was associated with tachypnea (likely secondary to ongoing pain syndrome) but as a precaution D-dimer was checked and was elevated-CTA of chest without evidence of PE -Venous duplex did reveal an acute SVT of the left cephalic vein at site of previous IV infiltration.  Recurrent urinary retention -Required repeat I/O cath-foley inserted 3/14 w/ 900 cc returned. -Possibly recurred 2/2 meds which have  been dc'd  Recent Group B strep UTI/recurrent fevers-acute MRSA UTI -GBS UTI from 2/12 appears to have been treated with ceftriaxone. 2/27 yeast UTI, completed fluconazole, WBC remains normal this morning  -Fever on 2/27, CXR with bibasilar opacities, urine culture showed yeast and completed fluconazole. Blood cultures1/2 with staph epidermidis (likely contaminated) -Low-grade temp 100.2 as of 3/5 overnight, afebrile since. Closely monitor -Another temp up to 101.1 on the afternoon of 3/14-pan cultured-UA abnormal-CXR unremarkable and actually demonstrates improving bibasilar atelectasis; as of a.m. of 3/15 patient is afebrile -Abdominal ultrasound without evidence of acute cholecystitis -Repeat urine cx resulted 3/17 c/w MRSA sensitive to doxy which was initiated  Goals of care discussion  -with palliative team x2 last interaction on 3/7.  At that time despite being made aware of patient's poor status and poor prognosis her husband reported to the palliative provider that he was just not ready to let her go. Husband did wish for PEG placement  -Husband is agreeable to outpatient palliative provider follow-up in the event his wife further declines and would need referral to hospice care -Dtr in law states husband told by neurology that if pt were to improve and fully awaken it would take at least 2 months -Patient was admitted on 2/12 and has not made any meaningful recovery.  She continues to have significant discomfort  relating to difficult to control spasticity and hypertonicity with pharmacotherapy limited by recent issues related to transaminitis secondary to medications utilized for this issue.  Likely will need to have palliative medicine speak at length again with patient's husband and family on 2/12 since this is the benchmark that the husband is utilizing before he makes any further decisions about changing from aggressive care to a comfort care status.  Dysphagia/Nutrition Nutrition  Status: Nutrition Problem: Inadequate oral intake Etiology: inability to eat Signs/Symptoms: NPO status Interventions: Tube feeding,Prostat Estimated body mass index is 31.09 kg/m as calculated from the following:   Height as of this encounter: _0  (1.6 m).   Weight as of this encounter: 79.6 kg. -Remains lethargic with most recent SLP evaluation on 3/7 recommended continued n.p.o. status-continue cortrack for tube feedings - s/p PEG 3/14  CKD 3A- AKI due to dehydration  Right Sided Hydronephrosis  -Baseline creatinine ~ 1.0-1.3 -Creatinine has stabilized with increasing free water from 100 cc to 200 cc every 6 hours -Earlier in the hospitalization due to steady rise of creatinine arenal ultrasound completed whichshowed right sided hydronephrosis(no mass or definite calcification identified).  Nephrology, urology consulted and she is status post right nephrostomy tube placement by IR on 2/21.  -3/14 Foley catheter placed secondary to significant urinary retention of greater than 900 cc.  Will need to discharge with this in place especially with need for skeletal muscle relaxants and patient's altered mentation.  Diabetes mellitus 2 on current long-term insulin with hyperglycemia -Continue on insulin sliding scale every 4 hours while she is on tube feeds -Continue Levemir 5 units daily along with NovoLog 2 units every 4 hours  -CBGs have been between 111 and 180 over the past 24 hours -HgbA1c = 7.6  Hypertension/chronic grade 2 diastolic heart failure -2D echoperformedduring this admission reveals grade 2 diastolic dysfunction -Remains euvolemic -Continue Norvasc  Physical deconditioning/residual left hemiplegia from prior stroke PT eval 3/23:     Pt making very slow progress towards physical therapy goals. Husband present this session and pt opened eyes for him x2, as well as attempted to verbalize a few times to different questions. Pt demonstrated fair sitting balance this  session and able to accept mild challenges vs resisting movement from therapist. She initiated trunk to neutral after weight bearing on L elbow for activity. Will continue to follow and progress as able per POC.     OT eval 3/23     Patient supine in bed, spouse at side.  Engaged in OT/PT session, total assist for bed mobility to EOB.  Seated EOB for pressure relief, posture and strengthening; worked on lateral leans, grooming tasks, and ROM of UEs.  Total assist given HOH for grooming (washing face, hands) but spontaneously bringing L hand towards mouth. Gentle ROM of UEs, but resistive to extension at times.  Keeps eyes closed during session, but following intermittent commands to initate return to midline. Donned R resting hand splint and L palm protector at end of session (B palm protectors on upon entry). Will follow acutely. SNF remains appropriate.       SLP eval 3/21 Pt continues to be minimally responsive. With total assist feeding of drips of water siphoned from a straw pt will seal lips of straw and swallow water. However, she never initiates sipping without total assist from SLP. After trials there is delayed weak coughing indicative of possible aspiration. Her arousal has been persistently insufficient since admit and pt has not met therapy goals over the  past month. Will sign off at this time. Please reorder if pts status changes.   Other problems: Group B strep UTI/recent fevers -GBS UTI from 2/12 appears to have been treated with ceftriaxone. 2/27 yeast UTI, completed fluconazole, WBC remains normal this morning  -Fever on 2/27, CXR with bibasilar opacities, urine culture showed yeast and completed fluconazole. Blood cultures1/2 with staph epidermidis (likely contaminated) -3/15 see above regarding recurrence of fevers and additional work-up  Intermittent agitation  -Has had issues with agitation when awake but for the most part remains lethargic -Continue liberalized  visitation so family can sit with her  Gluteal skin breakdown -[place] and barrier cream -Continue air mattress to prevent further skin breakdown -turn as able Wound / Incision (Open or Dehisced) 03/27/20 Puncture Flank Right;Posterior nephrostomy tube placement insertion site (Active)  Date First Assessed/Time First Assessed: 03/27/20 1406   Wound Type: Puncture  Location: Flank  Location Orientation: Right;Posterior  Wound Description (Comments): nephrostomy tube placement insertion site  Present on Admission: No    Assessments 03/27/2020  8:09 AM 04/26/2020  8:00 PM  Dressing Type Gauze (Comment) Gauze (Comment)  Dressing Changed New --  Dressing Status Clean;Dry;Intact Clean;Dry;Intact  Dressing Change Frequency -- Daily  Site / Wound Assessment Clean;Dry Clean;Dry  Closure -- Sutures  Drainage Amount -- None     No Linked orders to display     Wound / Incision (Open or Dehisced) 04/17/20 Puncture Abdomen Left;Anterior;Upper G-tube insertion site  (Active)  Date First Assessed/Time First Assessed: 04/17/20 0915   Wound Type: Puncture  Location: Abdomen  Location Orientation: Left;Anterior;Upper  Wound Description (Comments): G-tube insertion site   Present on Admission: No    Assessments 04/17/2020  7:20 AM 04/26/2020  8:00 PM  Dressing Type Gauze (Comment);Tape dressing Gauze (Comment)  Dressing Changed New Changed  Dressing Status Clean;Dry;Intact Clean;Dry;Intact  Dressing Change Frequency PRN PRN  Site / Wound Assessment Dressing in place / Unable to assess Clean;Dry  Peri-wound Assessment Intact --  Drainage Amount None --  Treatment -- Other (Comment)     No Linked orders to display    Hypothyroidism -Continue Synthroid per tube -TSH 6.651 on 2/12 and likely an acute phase reactant consistent with sick euthyroid syndrome -TSH with T3 and T4 will need to be repeated in May 2022  Hyperkalemia Resolved -Potassium normal   Data Reviewed: Basic Metabolic  Panel: Recent Labs  Lab 04/22/20 0055 04/23/20 0845 04/24/20 0458 04/26/20 0453  NA 136 136 135 136  K 4.6 4.2 4.4 5.8*  CL 100 98 98 99  CO2 _0 GLUCOSE 137* 139* 178* 147*  BUN 40* 36* 35* 38*  CREATININE 0.98 0.86 0.89 0.95  CALCIUM 9.4 9.4 9.4 9.5  MG 1.9 2.0  --   --    Liver Function Tests: Recent Labs  Lab 04/22/20 0055 04/24/20 0458 04/26/20 0453  AST _1 ALT _2 ALKPHOS 190* 144* 125  BILITOT 0.7 0.7 0.7  PROT 6.0* 5.6* 5.6*  ALBUMIN 2.3* 2.1* 2.2*   No results for input(s): LIPASE, AMYLASE in the last 168 hours. Recent Labs  Lab 04/22/20 0055  AMMONIA 12   CBC: Recent Labs  Lab 04/22/20 0055 04/23/20 0845 04/24/20 0458  WBC 5.8 5.7 5.8  NEUTROABS  --  3.7  --   HGB 10.9* 10.8* 10.3*  HCT 33.4* 33.1* 32.9*  MCV 97.9 98.2 99.4  PLT 299 289 267   Cardiac Enzymes: No results for input(s): CKTOTAL,  CKMB, CKMBINDEX, TROPONINI in the last 168 hours. BNP (last 3 results) No results for input(s): BNP in the last 8760 hours.  ProBNP (last 3 results) No results for input(s): PROBNP in the last 8760 hours.  CBG: Recent Labs  Lab 04/26/20 1131 04/26/20 1611 04/26/20 2012 04/26/20 2353 04/27/20 0356  GLUCAP 143* 180* 149* 146* 145*    Recent Results (from the past 240 hour(s))  Culture, blood (routine x 2)     Status: None   Collection Time: 04/17/20  4:48 PM   Specimen: BLOOD  Result Value Ref Range Status   Specimen Description BLOOD LEFT ANTECUBITAL  Final   Special Requests   Final    BOTTLES DRAWN AEROBIC ONLY Blood Culture adequate volume   Culture   Final    NO GROWTH 5 DAYS Performed at [facility], [place] [address], [place], [place] [address]    Report Status 04/22/2020 FINAL  Final  Culture, blood (routine x 2)     Status: Abnormal   Collection Time: 04/17/20  4:48 PM   Specimen: BLOOD RIGHT HAND  Result Value Ref Range Status   Specimen Description BLOOD RIGHT HAND  Final   Special Requests   Final     BOTTLES DRAWN AEROBIC ONLY Blood Culture adequate volume   Culture  Setup Time   Final    AEROBIC BOTTLE ONLY GRAM POSITIVE COCCI CRITICAL RESULT CALLED TO, READ BACK BY AND VERIFIED WITH: Iraan J.  [phone] FCP    Culture (A)  Final    STAPHYLOCOCCUS EPIDERMIDIS THE SIGNIFICANCE OF ISOLATING THIS ORGANISM FROM A SINGLE SET OF BLOOD CULTURES WHEN MULTIPLE SETS ARE DRAWN IS UNCERTAIN. PLEASE NOTIFY THE MICROBIOLOGY DEPARTMENT WITHIN ONE WEEK IF SPECIATION AND SENSITIVITIES ARE REQUIRED. Performed at [facility], [place] [address], [place], [place] [address]    Report Status 04/21/2020 FINAL  Final  Blood Culture ID Panel (Reflexed)     Status: Abnormal   Collection Time: 04/17/20  4:48 PM  Result Value Ref Range Status   Enterococcus faecalis NOT DETECTED NOT DETECTED Final   Enterococcus Faecium NOT DETECTED NOT DETECTED Final   Listeria monocytogenes NOT DETECTED NOT DETECTED Final   Staphylococcus species DETECTED (A) NOT DETECTED Final    Comment: CRITICAL RESULT CALLED TO, READ BACK BY AND VERIFIED WITH: PHARMD THOMAS J.  [phone] FCP    Staphylococcus aureus (BCID) NOT DETECTED NOT DETECTED Final   Staphylococcus epidermidis DETECTED (A) NOT DETECTED Final    Comment: Methicillin (oxacillin) resistant coagulase negative staphylococcus. Possible blood culture contaminant (unless isolated from more than one blood culture draw or clinical case suggests pathogenicity). No antibiotic treatment is indicated for blood  culture contaminants. CRITICAL RESULT CALLED TO, READ BACK BY AND VERIFIED WITH: PHARMD Gwen Her  [id number] [phone] FCP    Staphylococcus lugdunensis NOT DETECTED NOT DETECTED Final   Streptococcus species NOT DETECTED NOT DETECTED Final   Streptococcus agalactiae NOT DETECTED NOT DETECTED Final   Streptococcus pneumoniae NOT DETECTED NOT DETECTED Final   Streptococcus pyogenes NOT DETECTED NOT DETECTED Final   A.calcoaceticus-baumannii NOT DETECTED NOT  DETECTED Final   Bacteroides fragilis NOT DETECTED NOT DETECTED Final   Enterobacterales NOT DETECTED NOT DETECTED Final   Enterobacter cloacae complex NOT DETECTED NOT DETECTED Final   Escherichia coli NOT DETECTED NOT DETECTED Final   Klebsiella aerogenes NOT DETECTED NOT DETECTED Final   Klebsiella oxytoca NOT DETECTED NOT DETECTED Final   Klebsiella pneumoniae NOT DETECTED NOT DETECTED  Final   Proteus species NOT DETECTED NOT DETECTED Final   Salmonella species NOT DETECTED NOT DETECTED Final   Serratia marcescens NOT DETECTED NOT DETECTED Final   Haemophilus influenzae NOT DETECTED NOT DETECTED Final   Neisseria meningitidis NOT DETECTED NOT DETECTED Final   Pseudomonas aeruginosa NOT DETECTED NOT DETECTED Final   Stenotrophomonas maltophilia NOT DETECTED NOT DETECTED Final   Candida albicans NOT DETECTED NOT DETECTED Final   Candida auris NOT DETECTED NOT DETECTED Final   Candida glabrata NOT DETECTED NOT DETECTED Final   Candida krusei NOT DETECTED NOT DETECTED Final   Candida parapsilosis NOT DETECTED NOT DETECTED Final   Candida tropicalis NOT DETECTED NOT DETECTED Final   Cryptococcus neoformans/gattii NOT DETECTED NOT DETECTED Final   Methicillin resistance mecA/C DETECTED (A) NOT DETECTED Final    Comment: CRITICAL RESULT CALLED TO, READ BACK BY AND VERIFIED WITH: Gracy Bruins  [id number] [phone] FCP Performed at [facility], [place] [address], [place], [place] [address]   Culture, Urine     Status: Abnormal   Collection Time: 04/18/20  3:57 AM   Specimen: Urine, Catheterized  Result Value Ref Range Status   Specimen Description URINE, CATHETERIZED  Final   Special Requests   Final    NONE Performed at [facility], [place] [address], [place], [place] [address]    Culture (A)  Final    80,000 COLONIES/mL METHICILLIN RESISTANT STAPHYLOCOCCUS AUREUS   Report Status 04/20/2020 FINAL  Final   Organism ID, Bacteria METHICILLIN RESISTANT STAPHYLOCOCCUS AUREUS (A)   Final      Susceptibility   Methicillin resistant staphylococcus aureus - MIC*    CIPROFLOXACIN >=8 RESISTANT Resistant     GENTAMICIN <=0.5 SENSITIVE Sensitive     NITROFURANTOIN <=16 SENSITIVE Sensitive     OXACILLIN >=4 RESISTANT Resistant     TETRACYCLINE <=1 SENSITIVE Sensitive     VANCOMYCIN <=0.5 SENSITIVE Sensitive     TRIMETH/SULFA <=10 SENSITIVE Sensitive     CLINDAMYCIN <=0.25 SENSITIVE Sensitive     RIFAMPIN <=0.5 SENSITIVE Sensitive     Inducible Clindamycin NEGATIVE Sensitive     * 80,000 COLONIES/mL METHICILLIN RESISTANT STAPHYLOCOCCUS AUREUS     Studies: No results found.  Scheduled Meds: . acetaminophen  650 mg Per NG tube Q6H  . amLODipine  5 mg Per Tube Daily  . chlorhexidine  15 mL Mouth Rinse BID  . Chlorhexidine Gluconate Cloth  6 each Topical Daily  . clopidogrel  75 mg Per Tube Daily  . feeding supplement (PROSource TF)  45 mL Per Tube TID  . free water  200 mL Per Tube Q6H  . heparin  5,000 Units Subcutaneous Q8H  . insulin aspart  0-9 Units Subcutaneous Q4H  . insulin aspart  2 Units Subcutaneous Q4H  . insulin detemir  5 Units Subcutaneous Daily  . levothyroxine  112 mcg Per Tube QAC breakfast  . liver oil-zinc oxide   Topical BID  . mouth rinse  15 mL Mouth Rinse q12n4p  . methocarbamol  500 mg Per Tube QID  . modafinil  200 mg Per Tube Daily  . oxyCODONE  2.5 mg Per Tube Q8H  . rosuvastatin  2.5 mg Per Tube q1800  . sodium chloride flush  5 mL Intracatheter Q8H   Continuous Infusions: . sodium chloride Stopped (04/23/20 0049)  . feeding supplement (GLUCERNA 1.2 CAL) 1,000 mL (04/26/20 2322)    Principal Problem:   Acute encephalopathy Active Problems:   Diabetes mellitus ([place])  Hypothyroidism   History of embolic stroke   Acute bilateral thalamic infarction ([place])   Dysphagia   CKD (chronic kidney disease), symptom management only, stage 3 (moderate) ([facility])   Hydronephrosis   Group B streptococcal UTI   Uncontrolled type 2 diabetes  mellitus with hyperglycemia, with long-term current use of insulin ([facility])   Chronic diastolic heart failure ([facility])   Essential hypertension   Physical debility   Left hemiplegia ([place])   Palliative care by specialist   Goals of care, counseling/discussion   DNR (do not resuscitate)   Fever   Acute lower UTI   Tachypnea   Left upper extremity swelling   Altered mental status   Consultants: Neurology  Palliative care Nephrology Urology IR  Procedures: Right percutaneous nephrostomy 2/21  Antibiotics: Anti-infectives (From admission, onward)   Start     Dose/Rate Route Frequency Ordered Stop   04/20/20 1000  doxycycline (VIBRAMYCIN) 100 mg in sodium chloride 0.9 % 250 mL IVPB        100 mg 125 mL/hr over 120 Minutes Intravenous Every 12 hours 04/20/20 0831 04/24/20 2350   04/17/20 0807  ceFAZolin (ANCEF) 2-4 GM/100ML-% IVPB       Note to Pharmacy: Domenick Bookbinder   : cabinet override      04/17/20 0807 04/17/20 0847   04/07/20 2000  fluconazole (DIFLUCAN) 10 MG/ML suspension 100 mg        100 mg Per Tube Every evening 04/07/20 1236 04/08/20 2023   04/03/20 2000  fluconazole (DIFLUCAN) 40 MG/ML suspension 100 mg  Status:  Discontinued       "Followed by" Linked Group Details   100 mg Per Tube Every 24 hours 04/02/20 1752 04/07/20 1236   04/02/20 1900  fluconazole (DIFLUCAN) 40 MG/ML suspension 200 mg       "Followed by" Linked Group Details   200 mg Per Tube  Once 04/02/20 1752 04/02/20 2020   03/26/20 0730  cefTRIAXone (ROCEPHIN) 1 g in sodium chloride 0.9 % 100 mL IVPB        1 g 200 mL/hr over 30 Minutes Intravenous Every 24 hours 03/26/20 0644 03/30/20 0844   03/18/20 1900  azithromycin (ZITHROMAX) 500 mg in sodium chloride 0.9 % 250 mL IVPB  Status:  Discontinued        500 mg 250 mL/hr over 60 Minutes Intravenous Every 24 hours 03/18/20 1830 03/19/20 1106   03/18/20 1900  cefTRIAXone (ROCEPHIN) 2 g in sodium chloride 0.9 % 100 mL IVPB  Status:  Discontinued        2  g 200 mL/hr over 30 Minutes Intravenous Every 24 hours 03/18/20 1830 03/19/20 1106       Time spent: 20 minutes    Erin Hearing ANP  [facility] 7 am - 330 pm/M-F for direct patient care and secure chat Please refer to Amion for contact info 40  days

## 2020-04-27 NOTE — Plan of Care (Signed)
  Problem: Education: Goal: Knowledge of General Education information will improve Description: Including pain rating scale, medication(s)/side effects and non-pharmacologic comfort measures Outcome: Progressing   Problem: Health Behavior/Discharge Planning: Goal: Ability to manage health-related needs will improve Outcome: Progressing   Problem: Clinical Measurements: Goal: Ability to maintain clinical measurements within normal limits will improve Outcome: Progressing Goal: Will remain free from infection Outcome: Progressing Goal: Diagnostic test results will improve Outcome: Progressing Goal: Respiratory complications will improve Outcome: Progressing Goal: Cardiovascular complication will be avoided Outcome: Progressing   Problem: Activity: Goal: Risk for activity intolerance will decrease Outcome: Progressing   Problem: Nutrition: Goal: Adequate nutrition will be maintained Outcome: Progressing   Problem: Coping: Goal: Level of anxiety will decrease Outcome: Progressing   Problem: Elimination: Goal: Will not experience complications related to bowel motility Outcome: Progressing Goal: Will not experience complications related to urinary retention Outcome: Progressing   Problem: Pain Managment: Goal: General experience of comfort will improve Outcome: Progressing   Problem: Safety: Goal: Ability to remain free from injury will improve Outcome: Progressing   Problem: Skin Integrity: Goal: Risk for impaired skin integrity will decrease Outcome: Progressing   Problem: Education: Goal: Knowledge of disease or condition will improve Outcome: Progressing Goal: Knowledge of secondary prevention will improve Outcome: Progressing Goal: Knowledge of patient specific risk factors addressed and post discharge goals established will improve Outcome: Progressing Goal: Individualized Educational Video(s) Outcome: Progressing   Problem: Coping: Goal: Will verbalize  positive feelings about self Outcome: Progressing   Problem: Health Behavior/Discharge Planning: Goal: Ability to manage health-related needs will improve Outcome: Progressing   

## 2020-04-27 NOTE — Plan of Care (Signed)
  Problem: Education: Goal: Knowledge of General Education information will improve Description: Including pain rating scale, medication(s)/side effects and non-pharmacologic comfort measures Outcome: Progressing   Problem: Health Behavior/Discharge Planning: Goal: Ability to manage health-related needs will improve Outcome: Progressing   Problem: Clinical Measurements: Goal: Ability to maintain clinical measurements within normal limits will improve Outcome: Progressing Goal: Will remain free from infection Outcome: Progressing Goal: Diagnostic test results will improve Outcome: Progressing Goal: Respiratory complications will improve Outcome: Progressing Goal: Cardiovascular complication will be avoided Outcome: Progressing   Problem: Activity: Goal: Risk for activity intolerance will decrease Outcome: Progressing   Problem: Nutrition: Goal: Adequate nutrition will be maintained Outcome: Progressing   Problem: Coping: Goal: Level of anxiety will decrease Outcome: Progressing   Problem: Elimination: Goal: Will not experience complications related to bowel motility Outcome: Progressing Goal: Will not experience complications related to urinary retention Outcome: Progressing   Problem: Pain Managment: Goal: General experience of comfort will improve Outcome: Progressing   Problem: Safety: Goal: Ability to remain free from injury will improve Outcome: Progressing   Problem: Skin Integrity: Goal: Risk for impaired skin integrity will decrease Outcome: Progressing   Problem: Education: Goal: Knowledge of disease or condition will improve Outcome: Progressing Goal: Knowledge of secondary prevention will improve Outcome: Progressing Goal: Knowledge of patient specific risk factors addressed and post discharge goals established will improve Outcome: Progressing Goal: Individualized Educational Video(s) Outcome: Progressing   Problem: Coping: Goal: Will verbalize  positive feelings about self Outcome: Progressing   Problem: Health Behavior/Discharge Planning: Goal: Ability to manage health-related needs will improve Outcome: Progressing

## 2020-04-28 ENCOUNTER — Inpatient Hospital Stay (HOSPITAL_COMMUNITY): Payer: Medicare Other

## 2020-04-28 LAB — COMPREHENSIVE METABOLIC PANEL
ALT: 16 U/L (ref 0–44)
AST: 22 U/L (ref 15–41)
Albumin: 2.3 g/dL — ABNORMAL LOW (ref 3.5–5.0)
Alkaline Phosphatase: 123 U/L (ref 38–126)
Anion gap: 9 (ref 5–15)
BUN: 38 mg/dL — ABNORMAL HIGH (ref 8–23)
CO2: 30 mmol/L (ref 22–32)
Calcium: 9.7 mg/dL (ref 8.9–10.3)
Chloride: 98 mmol/L (ref 98–111)
Creatinine, Ser: 0.96 mg/dL (ref 0.44–1.00)
GFR, Estimated: 58 mL/min — ABNORMAL LOW (ref >60)
Glucose, Bld: 154 mg/dL — ABNORMAL HIGH (ref 70–99)
Potassium: 4.8 mmol/L (ref 3.5–5.1)
Sodium: 137 mmol/L (ref 135–145)
Total Bilirubin: 0.5 mg/dL (ref 0.3–1.2)
Total Protein: 6 g/dL — ABNORMAL LOW (ref 6.5–8.1)

## 2020-04-28 LAB — GLUCOSE, CAPILLARY
Glucose-Capillary: 105 mg/dL — ABNORMAL HIGH (ref 70–99)
Glucose-Capillary: 119 mg/dL — ABNORMAL HIGH (ref 70–99)
Glucose-Capillary: 122 mg/dL — ABNORMAL HIGH (ref 70–99)
Glucose-Capillary: 129 mg/dL — ABNORMAL HIGH (ref 70–99)
Glucose-Capillary: 145 mg/dL — ABNORMAL HIGH (ref 70–99)
Glucose-Capillary: 163 mg/dL — ABNORMAL HIGH (ref 70–99)
Glucose-Capillary: 165 mg/dL — ABNORMAL HIGH (ref 70–99)

## 2020-04-28 MED ORDER — METHOCARBAMOL 750 MG PO TABS
750.0000 mg | ORAL_TABLET | Freq: Four times a day (QID) | ORAL | Status: DC
  Administered 2020-04-28 – 2020-05-01 (×14): 750 mg
  Filled 2020-04-28 (×14): qty 1

## 2020-04-28 NOTE — Progress Notes (Addendum)
[facility] PROGRESS NOTE  Hannah Fischer POE:[id number] DOB: 1932-04-20 DOA: 03/18/2020 PCP: [name], MD         04/27/2020   04/28/2020     Status: Remains inpatient appropriate because:Altered mental status, Unsafe d/c plan and Inpatient level of care appropriate due to severity of illness   Dispo: The patient is from: Home              Anticipated d/c is to: SNF              Patient currently is medically stable to d/c.                Barriers to discharge: Patient has a limited amount of days on insurance for short-term SNF stay.  Family has subsequently confirmed that they will be able to pay for nursing facility services out-of-pocket once insurance benefits exhausted.  Discharge briefly delayed in context of transaminitis from medications used to treat hypertonicity.  LFTs have normalized and currently we are utilizing Robaxin which does not influence LFTs.  As of 3/23 [facility] confirmed that they will take the patient but they will not have a bed available until Tuesday, March 29.   Difficult to place patient Yes   Level of care: Progressive  Code Status: DNR Family Communication: 3/14 husband at bedside.  Attempted to contact patient's spouse by phone on 3/22 but did not answer and was sent to voicemail. 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 Shehad gone to bedin the morning her husbandhad trouble waking her up. In the ED she was noted to be responsive to pain. A code stroke was called. She was outside the window for TPA. An emergent CT did not show any acute abnormality. A CTA was performed and did not reveal any obvious stenosis. An MRI of the brain was performed on 2/13:Punctate acute infarction  in the left superior cerebellum adjacent to the superior cerebellar peduncle. Bilateral thalamicacute infarction extending back into the mid brain. This is consistent with artery of Percheron infarction.She continues to wax and wane from a mental status standpoint. She has cortrak. Palliative and neuro are following, family eventually decided for PEG which is now pending Plavix washout  Subjective: Awake with eyes open after I left a list to check pupils.  Kept eyes open.  Minimal tracking.  Did not follow commands.  Slightly shook head no when asked to stick out tongue.  Objective: Vitals:   04/28/20 0415 04/28/20 0824  BP: 135/73 (!) 148/68  Pulse: 73 86  Resp: 20 18  Temp: 98.4 F (36.9 C) 98.3 F (36.8 C)  SpO2: 96% 95%    Intake/Output Summary (Last 24 hours) at 04/28/2020 0825 Last data filed at 04/28/2020 0402 Gross per 24 hour  Intake -  Output 1900 ml  Net -1900 ml   Filed Weights   04/26/20 0332  Weight: 79.6 kg    Exam:  Constitutional: Calm, awake in no acute distress Respiratory: Lungs are clear to auscultation and she is stable on room air Cardiovascular: Normal heart sounds, pulse regular, normotensive.  Extremities warm and dry with adequate capillary refill Abdomen: PEG tube for feedings, nontender with normoactive bowel sounds.  LBM 3/23 Genitourinary: Right nephrostomy tube with small volume blood-tinged mildly amber urine noted in collection bag-Foley with yellow  urine to bedside bag with larger volume than nephrostomy bag. Neurologic: Cranial nerves II through XII intact. Minimal,purposeful movement RUE.Prafo boot applied bilaterally by [employer] con 3/23 Psychiatric: Does not fully awaken during my interaction.  Unable to accurately assess orientation   Assessment/Plan: Acute problems: Bilateral Thalamic Infarcts Persistent acute metabolic encephalopathy  -due to CVA, mental status waxing and waning, at times she is minimally responsive versus  talking some.  Neurology following -Continue Provigil  -She has a prior history of embolic infarcts with imaging on admission showed punctate acute infarction in the left superior cerebellum adjacent to the superior cerebellar peduncle.  Bilateral thalamic acute infarction extending back into the midbrain, consistent with artery of Percheron infarction.    -She had an event with asymmetric pupils on 2/25, had a repeat MRI without any new acute findings and expected evolution of known infarcts.   EEG 2/13 with moderate diffuse encephalopathy no apparent underlying seizure activity -Continue Plavix  -Continue low-dose Crestor. 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  -3/25 AST and ALT remain normal so will increase Robaxin to 750 mg QID (max dose allowed 8000 mg/24 hours) -Cont low-dose OxyIR 2.5 mg q 8 hrs  Left upper extremity swelling -Patient with left upper extremity swelling without evidence of fracture on imaging -This was associated with tachypnea (likely secondary to ongoing pain syndrome) but as a precaution D-dimer was checked and was elevated-CTA of chest without evidence of PE -Venous duplex did reveal an acute SVT of the left cephalic vein at site of previous IV infiltration.  Recurrent urinary retention -Required repeat I/O cath-foley inserted 3/14 w/ 900 cc returned. -Possibly recurred 2/2 meds which have been dc'd  Recent Group B strep UTI/recurrent fevers-acute MRSA UTI -GBS UTI from 2/12 appears to have been treated with ceftriaxone. 2/27 yeast UTI, completed  fluconazole, WBC remains normal this morning  -Fever on 2/27, CXR with bibasilar opacities, urine culture showed yeast and completed fluconazole. Blood cultures1/2 with staph epidermidis (likely contaminated) -Low-grade temp 100.2 as of 3/5 overnight, afebrile since. Closely monitor -Another temp up to 101.1 on the afternoon of 3/14-pan cultured-UA abnormal-CXR unremarkable and actually demonstrates improving bibasilar atelectasis; as of a.m. of 3/15 patient is afebrile -Abdominal ultrasound without evidence of acute cholecystitis -Repeat urine cx resulted 3/17 c/w MRSA sensitive to doxy which has been completed  Goals of care discussion  -with palliative team x2 last interaction on 3/7.  At that time despite being made aware of patient's poor status and poor prognosis her husband reported to the palliative provider that he was just not ready to let her go. Husband did wish for PEG placement  -Husband is agreeable to outpatient palliative provider follow-up in the event his wife further declines and would need referral to hospice care -Dtr in law states husband told by neurology that if pt were to improve and fully awaken it would take at least 2 months -Patient was admitted on 2/12 and has not made any meaningful recovery.  She continues to have significant discomfort relating to difficult to control spasticity and hypertonicity with pharmacotherapy limited by recent issues related to transaminitis secondary to medications utilized for this  issue.  Likely will need to have palliative medicine speak at length again with patient's husband and family on 2/12 since this is the benchmark that the husband is utilizing before he makes any further decisions about changing from aggressive care to a comfort care status.  Dysphagia/Nutrition Nutrition Status: Nutrition Problem: Inadequate oral intake Etiology: inability to eat Signs/Symptoms: NPO status Interventions: Tube feeding,Prostat Estimated body  mass index is 31.09 kg/m as calculated from the following:   Height as of this encounter: _0  (1.6 m).   Weight as of this encounter: 79.6 kg. -Remains lethargic with most recent SLP evaluation on 3/7 recommended continued n.p.o. status-continue cortrack for tube feedings - s/p PEG 3/14  CKD 3A- AKI due to dehydration  Chronic Right UVJ obstruction w/Hydronephrosis  -Baseline creatinine ~ 1.0-1.3 -Creatinine has stabilized with increasing free water from 100 cc to 200 cc every 6 hours -Earlier in the hospitalization due to steady rise of creatinine arenal ultrasound completed whichshowed right sided hydronephrosis(no mass or definite calcification identified).  Nephrology, urology consulted and she is status post right nephrostomy tube placement by IR on 2/21. As of 3/25 minimal UOP but now has foley 2/2 urinary obstruction. Will order Renal US to ensure PCN tube patency -3/14 Foley catheter placed secondary to significant urinary retention of greater than 900 cc.  Will need to discharge with this in place especially with need for skeletal muscle relaxants and patient's altered mentation.  Diabetes mellitus 2 on current long-term insulin with hyperglycemia -Continue on insulin sliding scale every 4 hours while she is on tube feeds -Continue Levemir 5 units daily along with NovoLog 2 units every 4 hours  -CBGs have been between 111 and 180 over the past 24 hours -HgbA1c = 7.6  Hypertension/chronic grade 2 diastolic heart failure -2D echoperformedduring this admission reveals grade 2 diastolic dysfunction -Remains euvolemic -Continue Norvasc  Physical deconditioning/residual left hemiplegia from prior stroke PT eval 3/23:     Pt making very slow progress towards physical therapy goals. Husband present this session and pt opened eyes for him x2, as well as attempted to verbalize a few times to different questions. Pt demonstrated fair sitting balance this session and able to accept  mild challenges vs resisting movement from therapist. She initiated trunk to neutral after weight bearing on L elbow for activity. Will continue to follow and progress as able per POC.     OT eval 3/23     Patient supine in bed, spouse at side.  Engaged in OT/PT session, total assist for bed mobility to EOB.  Seated EOB for pressure relief, posture and strengthening; worked on lateral leans, grooming tasks, and ROM of UEs.  Total assist given HOH for grooming (washing face, hands) but spontaneously bringing L hand towards mouth. Gentle ROM of UEs, but resistive to extension at times.  Keeps eyes closed during session, but following intermittent commands to initate return to midline. Donned R resting hand splint and L palm protector at end of session (B palm protectors on upon entry). Will follow acutely. SNF remains appropriate.       SLP eval 3/21 Pt continues to be minimally responsive. With total assist feeding of drips of water siphoned from a straw pt will seal lips of straw and swallow water. However, she never initiates sipping without total assist from SLP. After trials there is delayed weak coughing indicative of possible aspiration. Her arousal has been persistently insufficient since admit and pt has not met therapy goals over the past  month. Will sign off at this time. Please reorder if pts status changes.   Other problems: Group B strep UTI/recent fevers -GBS UTI from 2/12 appears to have been treated with ceftriaxone. 2/27 yeast UTI, completed fluconazole, WBC remains normal this morning  -Fever on 2/27, CXR with bibasilar opacities, urine culture showed yeast and completed fluconazole. Blood cultures1/2 with staph epidermidis (likely contaminated) -3/15 see above regarding recurrence of fevers and additional work-up  Intermittent agitation  -Has had issues with agitation when awake but for the most part remains lethargic -Continue liberalized visitation so family can sit with  her  Gluteal skin breakdown -[place] and barrier cream -Continue air mattress to prevent further skin breakdown -turn as able Wound / Incision (Open or Dehisced) 03/27/20 Puncture Flank Right;Posterior nephrostomy tube placement insertion site (Active)  Date First Assessed/Time First Assessed: 03/27/20 1406   Wound Type: Puncture  Location: Flank  Location Orientation: Right;Posterior  Wound Description (Comments): nephrostomy tube placement insertion site  Present on Admission: No    Assessments 03/27/2020  8:09 AM 04/27/2020  8:00 PM  Dressing Type Gauze (Comment) Gauze (Comment)  Dressing Changed New -  Dressing Status Clean;Dry;Intact Clean;Dry;Intact  Dressing Change Frequency - Daily  Site / Wound Assessment Clean;Dry Clean;Dry  Drainage Amount - None  Drainage Description - Serosanguineous     No Linked orders to display     Wound / Incision (Open or Dehisced) 04/17/20 Puncture Abdomen Left;Anterior;Upper G-tube insertion site  (Active)  Date First Assessed/Time First Assessed: 04/17/20 0915   Wound Type: Puncture  Location: Abdomen  Location Orientation: Left;Anterior;Upper  Wound Description (Comments): G-tube insertion site   Present on Admission: No    Assessments 04/17/2020  7:20 AM 04/27/2020  8:00 PM  Dressing Type Gauze (Comment);Tape dressing Gauze (Comment)  Dressing Changed New Changed  Dressing Status Clean;Dry;Intact Clean;Dry;Intact  Dressing Change Frequency PRN PRN  Site / Wound Assessment Dressing in place / Unable to assess Clean;Dry  Peri-wound Assessment Intact Intact  Closure - None  Drainage Amount None None     No Linked orders to display    Hypothyroidism -Continue Synthroid per tube -TSH 6.651 on 2/12 and likely an acute phase reactant consistent with sick euthyroid syndrome -TSH with T3 and T4 will need to be repeated in May 2022  Hyperkalemia Resolved -Potassium normal   Data Reviewed: Basic Metabolic Panel: Recent Labs  Lab  04/22/20 0055 04/23/20 0845 04/24/20 0458 04/26/20 0453 04/28/20 0540  NA 136 136 135 136 137  K 4.6 4.2 4.4 5.8* 4.8  CL 100 98 98 99 98  CO2 _0 GLUCOSE 137* 139* 178* 147* 154*  BUN 40* 36* 35* 38* 38*  CREATININE 0.98 0.86 0.89 0.95 0.96  CALCIUM 9.4 9.4 9.4 9.5 9.7  MG 1.9 2.0  --   --   --    Liver Function Tests: Recent Labs  Lab 04/22/20 0055 04/24/20 0458 04/26/20 0453 04/28/20 0540  AST _1 ALT _2 ALKPHOS 190* 144* 125 123  BILITOT 0.7 0.7 0.7 0.5  PROT 6.0* 5.6* 5.6* 6.0*  ALBUMIN 2.3* 2.1* 2.2* 2.3*   No results for input(s): LIPASE, AMYLASE in the last 168 hours. Recent Labs  Lab 04/22/20 0055  AMMONIA 12   CBC: Recent Labs  Lab 04/22/20 0055 04/23/20 0845 04/24/20 0458  WBC 5.8 5.7 5.8  NEUTROABS  --  3.7  --   HGB 10.9* 10.8* 10.3*  HCT 33.4* 33.1*  32.9*  MCV 97.9 98.2 99.4  PLT 299 289 267   Cardiac Enzymes: No results for input(s): CKTOTAL, CKMB, CKMBINDEX, TROPONINI in the last 168 hours. BNP (last 3 results) No results for input(s): BNP in the last 8760 hours.  ProBNP (last 3 results) No results for input(s): PROBNP in the last 8760 hours.  CBG: Recent Labs  Lab 04/27/20 1738 04/27/20 2012 04/28/20 0008 04/28/20 0342 04/28/20 0820  GLUCAP 175* 152* 119* 129* 145*    No results found for this or any previous visit (from the past 240 hour(s)).   Studies: No results found.  Scheduled Meds: . acetaminophen  650 mg Per NG tube Q6H  . amLODipine  5 mg Per Tube Daily  . chlorhexidine  15 mL Mouth Rinse BID  . Chlorhexidine Gluconate Cloth  6 each Topical Daily  . clopidogrel  75 mg Per Tube Daily  . feeding supplement (PROSource TF)  45 mL Per Tube TID  . free water  200 mL Per Tube Q6H  . heparin  5,000 Units Subcutaneous Q8H  . insulin aspart  0-9 Units Subcutaneous Q4H  . insulin aspart  2 Units Subcutaneous Q4H  . insulin detemir  5 Units Subcutaneous Daily  . levothyroxine  112 mcg Per  Tube QAC breakfast  . liver oil-zinc oxide   Topical BID  . mouth rinse  15 mL Mouth Rinse q12n4p  . methocarbamol  500 mg Per Tube QID  . modafinil  200 mg Per Tube Daily  . oxyCODONE  2.5 mg Per Tube Q8H  . rosuvastatin  2.5 mg Per Tube q1800  . sodium chloride flush  5 mL Intracatheter Q8H   Continuous Infusions: . sodium chloride Stopped (04/23/20 0049)  . feeding supplement (GLUCERNA 1.2 CAL) 1,000 mL (04/28/20 0117)    Principal Problem:   Acute encephalopathy Active Problems:   Diabetes mellitus ([place])   Hypothyroidism   History of embolic stroke   Acute bilateral thalamic infarction ([place])   Dysphagia   CKD (chronic kidney disease), symptom management only, stage 3 (moderate) ([facility])   Hydronephrosis   Group B streptococcal UTI   Uncontrolled type 2 diabetes mellitus with hyperglycemia, with long-term current use of insulin ([facility])   Chronic diastolic heart failure ([facility])   Essential hypertension   Physical debility   Left hemiplegia ([place])   Palliative care by specialist   Goals of care, counseling/discussion   DNR (do not resuscitate)   Fever   Acute lower UTI   Tachypnea   Left upper extremity swelling   Altered mental status   Consultants: Neurology  Palliative care Nephrology Urology IR  Procedures: Right percutaneous nephrostomy 2/21  Antibiotics: Anti-infectives (From admission, onward)   Start     Dose/Rate Route Frequency Ordered Stop   04/20/20 1000  doxycycline (VIBRAMYCIN) 100 mg in sodium chloride 0.9 % 250 mL IVPB        100 mg 125 mL/hr over 120 Minutes Intravenous Every 12 hours 04/20/20 0831 04/24/20 2350   04/17/20 0807  ceFAZolin (ANCEF) 2-4 GM/100ML-% IVPB       Note to Pharmacy: Domenick Bookbinder   : cabinet override      04/17/20 0807 04/17/20 0847   04/07/20 2000  fluconazole (DIFLUCAN) 10 MG/ML suspension 100 mg        100 mg Per Tube Every evening 04/07/20 1236 04/08/20 2023   04/03/20 2000  fluconazole (DIFLUCAN) 40 MG/ML suspension  100 mg  Status:  Discontinued       "Followed  by" Linked Group Details   100 mg Per Tube Every 24 hours 04/02/20 1752 04/07/20 1236   04/02/20 1900  fluconazole (DIFLUCAN) 40 MG/ML suspension 200 mg       "Followed by" Linked Group Details   200 mg Per Tube  Once 04/02/20 1752 04/02/20 2020   03/26/20 0730  cefTRIAXone (ROCEPHIN) 1 g in sodium chloride 0.9 % 100 mL IVPB        1 g 200 mL/hr over 30 Minutes Intravenous Every 24 hours 03/26/20 0644 03/30/20 0844   03/18/20 1900  azithromycin (ZITHROMAX) 500 mg in sodium chloride 0.9 % 250 mL IVPB  Status:  Discontinued        500 mg 250 mL/hr over 60 Minutes Intravenous Every 24 hours 03/18/20 1830 03/19/20 1106   03/18/20 1900  cefTRIAXone (ROCEPHIN) 2 g in sodium chloride 0.9 % 100 mL IVPB  Status:  Discontinued        2 g 200 mL/hr over 30 Minutes Intravenous Every 24 hours 03/18/20 1830 03/19/20 1106       Time spent: 20 minutes    Erin Hearing ANP  [facility] 7 am - 330 pm/M-F for direct patient care and secure chat Please refer to Amion for contact info 41  days

## 2020-04-28 NOTE — Progress Notes (Signed)
Physical Therapy Treatment Patient Details Name: Hannah Fischer MRN: [id number] DOB: 05/22/1932 Today's Date: 04/28/2020    History of Present Illness Pt is an 87 y/o female admitted 03/18/20 with CVA. Imaging (+) for bilateral medial thalamic and punctate infarcts and L superior cerebellar infarcts. PEG placed 3/14. PMH significant for multiple CVAs with most recent ~3 months ago, residual L-sided hemiplegia with recent d/c from outpatient PT/OT with return to baseline level of function, DM, PE, HTN.    PT Comments    Focus of session was repositioning in the bed, readjusting PRAFO's and hand splints, and PROM activity. Pt appeared somewhat painful with cervical stretching activity as well as any hand/wrist movement bilaterally. Pt was propped to the L to offload sacrum, and positioned in cervical neutral, HOB at 30. Pt responsive to pain but kept eyes closed throughout session. Pt not participating in ROM activity however actively resisting movement bilaterally (L>R). Will continue to follow and progress as able per POC.    Follow Up Recommendations  SNF;Supervision/Assistance - 24 hour     Equipment Recommendations  Wheelchair cushion (measurements PT);Wheelchair (measurements PT);Hospital bed    Recommendations for Other Services       Precautions / Restrictions Precautions Precautions: Fall Precaution Comments: nephrostomy drain, at risk for skin breakdown, PEG Restrictions Weight Bearing Restrictions: No    Mobility  Bed Mobility Overal bed mobility: Needs Assistance Bed Mobility: Supine to Sit;Sit to Supine;Rolling           General bed mobility comments: +2 total assist for all aspects of repositioning in the bed, including rolling and scooting up towards HOB.    Transfers                    Ambulation/Gait                 Stairs             Wheelchair Mobility    Modified Rankin (Stroke Patients Only) Modified Rankin (Stroke  Patients Only) Pre-Morbid Rankin Score: No significant disability Modified Rankin: Severe disability     Balance       Sitting balance - Comments: Not able to be tested this session                                    Cognition Arousal/Alertness: Lethargic Behavior During Therapy: Flat affect Overall Cognitive Status: Difficult to assess Area of Impairment: Attention;Following commands;Awareness;Problem solving                 Orientation Level: Disoriented to;Time;Situation;Person;Place Current Attention Level: Focused Memory: Decreased short-term memory;Decreased recall of precautions Following Commands: Follows one step commands inconsistently;Follows one step commands with increased time Safety/Judgement: Decreased awareness of safety;Decreased awareness of deficits Awareness: Intellectual Problem Solving: Slow processing;Decreased initiation;Difficulty sequencing;Requires verbal cues;Requires tactile cues General Comments: pt lethargic, keeping eyes closed and grimacing to stretching/passive movement      Exercises General Exercises - Upper Extremity Shoulder Flexion: PROM;10 reps;Supine;Both Shoulder ABduction: PROM;Right;Left;10 reps;Supine Shoulder ADduction: PROM;10 reps;Right;Left;Supine Elbow Flexion: PROM;Left;Right;10 reps;Supine Elbow Extension: PROM;Right;Left;10 reps;Supine Wrist Flexion: PROM;10 reps;Right;Left;Supine Wrist Extension: PROM;Right;Left;10 reps;Supine Composite Extension: PROM;Both;10 reps General Exercises - Lower Extremity Ankle Circles/Pumps: PROM;10 reps;Right;Left Other Exercises Other Exercises: L cervical rotation to neutral 3x30" Other Exercises: L cervical lateral flexion with R shoulder anchor 3x30"    General Comments        Pertinent Vitals/Pain Pain Assessment: Faces  Faces Pain Scale: Hurts little more Pain Location: generalized with all movement and stretching Pain Descriptors / Indicators:  Grimacing;Guarding Pain Intervention(s): Limited activity within patient's tolerance;Monitored during session;Repositioned    Home Living                      Prior Function            PT Goals (current goals can now be found in the care plan section) Acute Rehab PT Goals Patient Stated Goal: Unable to state. PT Goal Formulation: Patient unable to participate in goal setting Time For Goal Achievement: 05/01/20 Potential to Achieve Goals: Poor Progress towards PT goals: Progressing toward goals    Frequency    Min 2X/week      PT Plan Frequency needs to be updated    Co-evaluation              AM-PAC PT "6 Clicks" Mobility   Outcome Measure  Help needed turning from your back to your side while in a flat bed without using bedrails?: Total Help needed moving from lying on your back to sitting on the side of a flat bed without using bedrails?: Total Help needed moving to and from a bed to a chair (including a wheelchair)?: Total Help needed standing up from a chair using your arms (e.g., wheelchair or bedside chair)?: Total Help needed to walk in hospital room?: Total Help needed climbing 3-5 steps with a railing? : Total 6 Click Score: 6    End of Session Equipment Utilized During Treatment: Oxygen Activity Tolerance: Patient limited by lethargy Patient left: with call bell/phone within reach;in bed;with family/visitor present Nurse Communication: Mobility status PT Visit Diagnosis: Other symptoms and signs involving the nervous system (R29.898);Difficulty in walking, not elsewhere classified (R26.2);Unsteadiness on feet (R26.81);Muscle weakness (generalized) (M62.81)     Time: 8841-6606 PT Time Calculation (min) (ACUTE ONLY): 31 min  Charges:  $Therapeutic Activity: 23-37 mins                     Hannah Fischer, PT, DPT Acute Rehabilitation Services Pager: [phone] Office: [phone]    Hannah Fischer 04/28/2020, 2:16 PM

## 2020-04-28 NOTE — TOC Progression Note (Signed)
Transition of Care [facility]) - Progression Note    Patient Details  Name: Hannah Fischer MRN: [id number] Date of Birth: [birth year]  Transition of Care [facility]) CM/SW Contact  Janae Bridgeman, RN Phone Number: 04/28/2020, 2:32 PM  Clinical Narrative:    Case management called and spoke with Fransico Him and insurance authorization was started for Monday, May 01, 2020  for [facility] to ready bed available on Tuesday, 05/02/2020 - Ref # [id number].  The current clinicals were all faxed to [phone].  CM and MSW will continue to follow the patient for SNF placement on [facility].   Expected Discharge Plan: Skilled Nursing Facility Barriers to Discharge: Continued Medical Work up (Patient scheduled for PEG placement on 04/17/2020)  Expected Discharge Plan and Services Expected Discharge Plan: Skilled Nursing Facility In-house Referral: Clinical Social Work,Hospice / Palliative Care Discharge Planning Services: CM Consult Post Acute Care Choice: Skilled Nursing Facility Living arrangements for the past 2 months: Skilled Nursing Facility                                       Social Determinants of Health (SDOH) Interventions    Readmission Risk Interventions Readmission Risk Prevention Plan 04/13/2020  Transportation Screening Complete  Medication Review [facility]) Complete  PCP or Specialist appointment within 3-5 days of discharge Complete  HRI or Home Care Consult Complete  SW Recovery Care/Counseling Consult Complete  Palliative Care Screening Complete  Skilled Nursing Facility Complete  Some recent data might be hidden

## 2020-04-28 NOTE — Plan of Care (Signed)
  Problem: Education: Goal: Knowledge of General Education information will improve Description: Including pain rating scale, medication(s)/side effects and non-pharmacologic comfort measures Outcome: Progressing   Problem: Health Behavior/Discharge Planning: Goal: Ability to manage health-related needs will improve Outcome: Progressing   Problem: Clinical Measurements: Goal: Ability to maintain clinical measurements within normal limits will improve Outcome: Progressing Goal: Will remain free from infection Outcome: Progressing Goal: Diagnostic test results will improve Outcome: Progressing Goal: Respiratory complications will improve Outcome: Progressing Goal: Cardiovascular complication will be avoided Outcome: Progressing   Problem: Activity: Goal: Risk for activity intolerance will decrease Outcome: Progressing   Problem: Nutrition: Goal: Adequate nutrition will be maintained Outcome: Progressing   Problem: Coping: Goal: Level of anxiety will decrease Outcome: Progressing   Problem: Elimination: Goal: Will not experience complications related to bowel motility Outcome: Progressing Goal: Will not experience complications related to urinary retention Outcome: Progressing   Problem: Pain Managment: Goal: General experience of comfort will improve Outcome: Progressing   Problem: Safety: Goal: Ability to remain free from injury will improve Outcome: Progressing   Problem: Skin Integrity: Goal: Risk for impaired skin integrity will decrease Outcome: Progressing   Problem: Education: Goal: Knowledge of disease or condition will improve Outcome: Progressing Goal: Knowledge of secondary prevention will improve Outcome: Progressing Goal: Knowledge of patient specific risk factors addressed and post discharge goals established will improve Outcome: Progressing Goal: Individualized Educational Video(s) Outcome: Progressing   Problem: Coping: Goal: Will verbalize  positive feelings about self Outcome: Progressing   Problem: Health Behavior/Discharge Planning: Goal: Ability to manage health-related needs will improve Outcome: Progressing   

## 2020-04-29 ENCOUNTER — Inpatient Hospital Stay (HOSPITAL_COMMUNITY): Payer: Medicare Other

## 2020-04-29 LAB — CBC WITH DIFFERENTIAL/PLATELET
Abs Immature Granulocytes: 0.06 10*3/uL (ref 0.00–0.07)
Basophils Absolute: 0.1 10*3/uL (ref 0.0–0.1)
Basophils Relative: 1 %
Eosinophils Absolute: 0.3 10*3/uL (ref 0.0–0.5)
Eosinophils Relative: 6 %
HCT: 36.4 % (ref 36.0–46.0)
Hemoglobin: 11.2 g/dL — ABNORMAL LOW (ref 12.0–15.0)
Immature Granulocytes: 1 %
Lymphocytes Relative: 29 %
Lymphs Abs: 1.4 10*3/uL (ref 0.7–4.0)
MCH: 30.7 pg (ref 26.0–34.0)
MCHC: 30.8 g/dL (ref 30.0–36.0)
MCV: 99.7 fL (ref 80.0–100.0)
Monocytes Absolute: 0.4 10*3/uL (ref 0.1–1.0)
Monocytes Relative: 9 %
Neutro Abs: 2.7 10*3/uL (ref 1.7–7.7)
Neutrophils Relative %: 54 %
Platelets: 316 10*3/uL (ref 150–400)
RBC: 3.65 MIL/uL — ABNORMAL LOW (ref 3.87–5.11)
RDW: 13.9 % (ref 11.5–15.5)
WBC: 5 10*3/uL (ref 4.0–10.5)
nRBC: 0 % (ref 0.0–0.2)

## 2020-04-29 LAB — COMPREHENSIVE METABOLIC PANEL
ALT: 49 U/L — ABNORMAL HIGH (ref 0–44)
AST: 87 U/L — ABNORMAL HIGH (ref 15–41)
Albumin: 2.3 g/dL — ABNORMAL LOW (ref 3.5–5.0)
Alkaline Phosphatase: 214 U/L — ABNORMAL HIGH (ref 38–126)
Anion gap: 7 (ref 5–15)
BUN: 37 mg/dL — ABNORMAL HIGH (ref 8–23)
CO2: 29 mmol/L (ref 22–32)
Calcium: 9.5 mg/dL (ref 8.9–10.3)
Chloride: 100 mmol/L (ref 98–111)
Creatinine, Ser: 0.93 mg/dL (ref 0.44–1.00)
GFR, Estimated: >60 mL/min (ref >60)
Glucose, Bld: 118 mg/dL — ABNORMAL HIGH (ref 70–99)
Potassium: 4.3 mmol/L (ref 3.5–5.1)
Sodium: 136 mmol/L (ref 135–145)
Total Bilirubin: 0.2 mg/dL — ABNORMAL LOW (ref 0.3–1.2)
Total Protein: 5.8 g/dL — ABNORMAL LOW (ref 6.5–8.1)

## 2020-04-29 LAB — URINALYSIS, ROUTINE W REFLEX MICROSCOPIC
Bacteria, UA: NONE SEEN
Bilirubin Urine: NEGATIVE
Glucose, UA: NEGATIVE mg/dL
Hgb urine dipstick: NEGATIVE
Ketones, ur: NEGATIVE mg/dL
Nitrite: NEGATIVE
Protein, ur: NEGATIVE mg/dL
Specific Gravity, Urine: 1.021 (ref 1.005–1.030)
pH: 5 (ref 5.0–8.0)

## 2020-04-29 LAB — GLUCOSE, CAPILLARY
Glucose-Capillary: 109 mg/dL — ABNORMAL HIGH (ref 70–99)
Glucose-Capillary: 137 mg/dL — ABNORMAL HIGH (ref 70–99)
Glucose-Capillary: 142 mg/dL — ABNORMAL HIGH (ref 70–99)
Glucose-Capillary: 144 mg/dL — ABNORMAL HIGH (ref 70–99)
Glucose-Capillary: 146 mg/dL — ABNORMAL HIGH (ref 70–99)
Glucose-Capillary: 167 mg/dL — ABNORMAL HIGH (ref 70–99)

## 2020-04-29 NOTE — Progress Notes (Signed)
PROGRESS NOTE    Hannah Fischer  HQP:[id number] DOB: 1932-06-13 DOA: 03/18/2020 PCP: Sigmund Hazel, MD    Chief Complaint  Patient presents with  . 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 Shehad gone to bedin the morning her husbandhad trouble waking her up. In the ED she was noted to be responsive to pain. A code stroke was called. She was outside the window for TPA. An emergent CT did not show any acute abnormality. A CTA was performed and did not reveal any obvious stenosis. An MRI of the brain was performed on 2/13:Punctate acute infarction in the left superior cerebellumadjacent to the superior cerebellar peduncle. Bilateral thalamicacute infarction extending back into the mid brain. This is consistent with artery of [place] infarction.She continues to wax and wane from a mental status standpoint. She had cortrak and subsequently underwent PEG tube placement..  Assessment & Plan:   Principal Problem:   Acute encephalopathy Active Problems:   Diabetes mellitus (HCC)   Hypothyroidism   History of embolic stroke   Acute bilateral thalamic infarction (HCC)   Dysphagia   CKD (chronic kidney disease), symptom management only, stage 3 (moderate) (HCC)   Hydronephrosis   Group B streptococcal UTI   Uncontrolled type 2 diabetes mellitus with hyperglycemia, with long-term current use of insulin (HCC)   Chronic diastolic heart failure (HCC)   Essential hypertension   Physical debility   Left hemiplegia (HCC)   Palliative care by specialist   Goals of care, counseling/discussion   DNR (do not resuscitate)   Fever   Acute lower UTI   Tachypnea   Left upper extremity swelling   Altered mental status   Acute CVA, MRI showing bilateral thalamic infarcts leading to persistent  acute metabolic encephalopathy Mental status waxing and waning. EEG shows moderate diffuse encephalopathy without any underlying seizure activity. Continue with Plavix and Crestor.    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    Recently treated for acute MRSA UTI/group B streptococcus UTI. Patient febrile this morning with T-max of 100.6 Repeat urine analysis and culture, blood cultures and chest x-ray ordered for further evaluation.  Repeat CBC and BMP in the morning.   Acute on stage IIIa CKD secondary to dehydration, chronic right UVJ obstruction with hydronephrosis Patient underwent right nephrostomy tube placement by IR on 2/21. No urine output in the PCN tube since 24 hours. IR consulted and plan for diet injection tomorrow to check for tube patency. Ultrasound of the kidney showed no hydronephrosis.   Hypertension Blood pressure parameters slightly high this morning.  Continue to monitor   Urinary retention S/p Foley catheter placement.  Chronic diastolic heart failure Last echocardiogram showed grade 2 diastolic dysfunction. Patient appears to be compensated at this time.    Dysphagia probably secondary to stroke S/p PEG placement on 04/17/2020.    Hypothyroidism Continue with Synthroid 112 MCG daily.    Hyperlipidemia Continue with Crestor 2.5 mg daily   Elevated liver enzymes Monitor.    Goals of care discussion In view of her poor progression, multiple comorbidities and no meaningful recovery from right thalamic strokes palliative care on board and appreciate recommendations.   DVT prophylaxis: Heparin Code Status: DNR Family Communication: (None at bedside. Disposition:   Status is: Inpatient  Remains inpatient appropriate because:Ongoing diagnostic testing needed not appropriate for  outpatient work up   [employer]: The patient is from: Home              Anticipated d/c is to: SNF              Patient currently  is not medically stable to d/c.   Difficult to place patient No       Consultants: Neurology  Palliative care Nephrology Urology IR  Procedures: Right percutaneous nephrostomy 2/21 Peg PLACEMENT.      Subjective: Lethargic.   Objective: Vitals:   04/28/20 2316 04/29/20 0307 04/29/20 0700 04/29/20 0849  BP: 134/84 120/89 (!) 142/66 (!) 144/75  Pulse: 78 (!) 57 81 98  Resp: 16 20 20    Temp: 98.6 F (37 C) 98.3 F (36.8 C) 98.6 F (37 C)   TempSrc: Oral Axillary Axillary   SpO2: 98% 99% 99%   Weight:      Height:        Intake/Output Summary (Last 24 hours) at 04/29/2020 1144 Last data filed at 04/29/2020 0600 Gross per 24 hour  Intake 4465 ml  Output 2250 ml  Net 2215 ml   Filed Weights   04/26/20 0332  Weight: 79.6 kg    Examination:  General exam: ill appearing lady not in distress.  Respiratory system: diminished at bases, no wheezing or rhonchi.  Cardiovascular system: S1 & S2 heard, RRR.  Gastrointestinal system: Abdomen is nondistended, soft and nontender.  Central nervous system: pt lethargic.unable to assess her neurological exam.  Extremities: no pedal edema.  Skin:s/p nephrostomy tube.  Psychiatry: cannot be assessed.     Data Reviewed: I have personally reviewed following labs and imaging studies  CBC: Recent Labs  Lab 04/23/20 0845 04/24/20 0458 04/29/20 0220  WBC 5.7 5.8 5.0  NEUTROABS 3.7  --  2.7  HGB 10.8* 10.3* 11.2*  HCT 33.1* 32.9* 36.4  MCV 98.2 99.4 99.7  PLT 289 267 316    Basic Metabolic Panel: Recent Labs  Lab 04/23/20 0845 04/24/20 0458 04/26/20 0453 04/28/20 0540 04/29/20 0220  NA 136 135 136 137 136  K 4.2 4.4 5.8* 4.8 4.3  CL 98 98 99 98 100  CO2 29 28 28 30 29   GLUCOSE 139* 178* 147* 154* 118*  BUN 36* 35* 38* 38* 37*  CREATININE 0.86 0.89 0.95 0.96 0.93  CALCIUM 9.4 9.4 9.5 9.7 9.5  MG 2.0  --   --   --   --     GFR: Estimated Creatinine Clearance: 44.2 mL/min (by C-G formula based on SCr  of 0.93 mg/dL).  Liver Function Tests: Recent Labs  Lab 04/24/20 0458 04/26/20 0453 04/28/20 0540 04/29/20 0220  AST 19 26 22  87*  ALT 20 16 16  49*  ALKPHOS 144* 125 123 214*  BILITOT 0.7 0.7 0.5 0.2*  PROT 5.6* 5.6* 6.0* 5.8*  ALBUMIN 2.1* 2.2* 2.3* 2.3*    CBG: Recent Labs  Lab 04/28/20 1636 04/28/20 1939 04/28/20 2314 04/29/20 0305 04/29/20 0759  GLUCAP 105* 165* 122* 109* 137*     No results found for this or any previous visit (from the past 240 hour(s)).       Radiology Studies: 04/30/20 RENAL  Result Date: 04/28/2020 CLINICAL DATA:  Evaluate for hydronephrosis. EXAM: RENAL / URINARY TRACT ULTRASOUND COMPLETE COMPARISON:  04/18/2020 FINDINGS: Right Kidney: Renal measurements: 9.3 x 4.8 x 4.9 cm = volume: 113.2 mL. Echogenicity within normal limits. No mass or hydronephrosis visualized. Inferior pole calculus measures 5 mm Left Kidney: Renal measurements: 9.0  x 4.3 x 4.5 cm = volume: 91.3 mL. Inferior pole not confidently identified due to overlying bowel gas. Echogenicity within normal limits. No mass or hydronephrosis visualized. Bladder: Bladder not visualized. Other: None. IMPRESSION: 1. No hydronephrosis identified. 2. Right renal stone measuring 5 mm. Electronically Signed   By: Signa Kell M.D.   On: 04/28/2020 17:13        Scheduled Meds: . acetaminophen  650 mg Per NG tube Q6H  . amLODipine  5 mg Per Tube Daily  . chlorhexidine  15 mL Mouth Rinse BID  . Chlorhexidine Gluconate Cloth  6 each Topical Daily  . clopidogrel  75 mg Per Tube Daily  . feeding supplement (PROSource TF)  45 mL Per Tube TID  . free water  200 mL Per Tube Q6H  . heparin  5,000 Units Subcutaneous Q8H  . insulin aspart  0-9 Units Subcutaneous Q4H  . insulin aspart  2 Units Subcutaneous Q4H  . insulin detemir  5 Units Subcutaneous Daily  . levothyroxine  112 mcg Per Tube QAC breakfast  . liver oil-zinc oxide   Topical BID  . mouth rinse  15 mL Mouth Rinse q12n4p  .  methocarbamol  750 mg Per Tube QID  . modafinil  200 mg Per Tube Daily  . oxyCODONE  2.5 mg Per Tube Q8H  . rosuvastatin  2.5 mg Per Tube q1800  . sodium chloride flush  5 mL Intracatheter Q8H   Continuous Infusions: . sodium chloride Stopped (04/23/20 0049)  . feeding supplement (GLUCERNA 1.2 CAL) 1,000 mL (04/29/20 0559)     LOS: 42 days       Kathlen Mody, MD [facility]   To contact the attending provider between 7A-7P or the covering provider during after hours 7P-7A, please log into the web site [email or web address] and access using universal [place] password for that web site. If you do not have the password, please call the hospital operator.  04/29/2020, 11:44 AM

## 2020-04-29 NOTE — Progress Notes (Signed)
RT in to assess pt for increased RR. At time of assessment, SpO2 96% on RA, HR 98, RR 32 with mostly clear bbs. Pt in no obvious distress, appears to be resting comfortably, but pt with small beads of sweat on forehead. Assessment findings relayed to RN, temperature being rechecked. RT will continue to monitor.

## 2020-04-30 ENCOUNTER — Inpatient Hospital Stay (HOSPITAL_COMMUNITY): Payer: Medicare Other

## 2020-04-30 HISTORY — PX: IR NEPHROSTOGRAM RIGHT THRU EXISTING ACCESS: IMG6062

## 2020-04-30 HISTORY — PX: IR US GUIDANCE: IMG2393

## 2020-04-30 LAB — GLUCOSE, CAPILLARY
Glucose-Capillary: 120 mg/dL — ABNORMAL HIGH (ref 70–99)
Glucose-Capillary: 127 mg/dL — ABNORMAL HIGH (ref 70–99)
Glucose-Capillary: 182 mg/dL — ABNORMAL HIGH (ref 70–99)
Glucose-Capillary: 163 mg/dL — ABNORMAL HIGH (ref 70–99)
Glucose-Capillary: 165 mg/dL — ABNORMAL HIGH (ref 70–99)
Glucose-Capillary: 126 mg/dL — ABNORMAL HIGH (ref 70–99)

## 2020-04-30 MED ORDER — LIDOCAINE HCL 1 % IJ SOLN
INTRAMUSCULAR | Status: AC
  Filled 2020-04-30: qty 20

## 2020-04-30 MED ORDER — IOHEXOL 300 MG/ML  SOLN: 50.0000 mL | Freq: Once | INTRAMUSCULAR | Status: DC | PRN

## 2020-04-30 NOTE — Plan of Care (Signed)
  Problem: Nutrition: Goal: Adequate nutrition will be maintained Outcome: Progressing   Problem: Coping: Goal: Level of anxiety will decrease Outcome: Progressing   Problem: Pain Managment: Goal: General experience of comfort will improve Outcome: Progressing   

## 2020-04-30 NOTE — Progress Notes (Signed)
PROGRESS NOTE    Hannah Fischer  DXA:[id number] DOB: [birth year] DOA: 03/18/2020 PCP: Sigmund Hazel, MD    Chief Complaint  Patient presents with  . 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 Shehad gone to bedin the morning her husbandhad trouble waking her up. In the ED she was noted to be responsive to pain. A code stroke was called. She was outside the window for TPA. An emergent CT did not show any acute abnormality. A CTA was performed and did not reveal any obvious stenosis. An MRI of the brain was performed on 2/13:Punctate acute infarction in the left superior cerebellumadjacent to the superior cerebellar peduncle. Bilateral thalamicacute infarction extending back into the mid brain. This is consistent with artery of [place] infarction.She continues to wax and wane from a mental status standpoint. She had cortrak and subsequently underwent PEG tube placement..  Pt seen and examined, no new complaints, continues to be lethargic. She is scheduled for nephrostogram and possible exchange today  By Dr Lowella Dandy.   Assessment & Plan:   Principal Problem:   Acute encephalopathy Active Problems:   Diabetes mellitus (HCC)   Hypothyroidism   History of embolic stroke   Acute bilateral thalamic infarction (HCC)   Dysphagia   CKD (chronic kidney disease), symptom management only, stage 3 (moderate) (HCC)   Hydronephrosis   Group B streptococcal UTI   Uncontrolled type 2 diabetes mellitus with hyperglycemia, with long-term current use of insulin (HCC)   Chronic diastolic heart failure (HCC)   Essential hypertension   Physical debility   Left hemiplegia (HCC)   Palliative care by specialist   Goals of care, counseling/discussion   DNR (do not resuscitate)   Fever   Acute lower  UTI   Tachypnea   Left upper extremity swelling   Altered mental status   Acute CVA, MRI showing bilateral thalamic infarcts leading to persistent acute metabolic encephalopathy Mental status waxing and waning. EEG shows moderate diffuse encephalopathy without any underlying seizure activity. Continue with Plavix and Crestor. No change in her mental status.     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    Recently treated for acute MRSA UTI/group B streptococcus UTI. Patient febrile on 3/26 with T-max of 100.6 Repeat urine analysis is negative for infection, blood cultures negative so far and chest x-ray shows Low lung volumes with streaky bibasilar atelectasis.ordered for further evaluation.  Repeat CBC and BMP unremarkable.    Acute on stage IIIa CKD secondary to dehydration, chronic right UVJ obstruction with hydronephrosis Patient underwent right nephrostomy tube placement by IR on 2/21. No urine output in the PCN tube since 24 hours. IR consulted and scheduled for nephrostogram and possible exchange today  By Dr Lowella Dandy.  Ultrasound of the kidney showed no hydronephrosis.   Hypertension BP parameters appear to be optimal today.    Urinary retention S/p Foley catheter placement.  Chronic diastolic heart failure Last echocardiogram showed grade 2 diastolic dysfunction. Patient appears to be compensated at this time.    Dysphagia probably secondary to stroke S/p PEG placement on 04/17/2020.    Hypothyroidism Continue with Synthroid 112 MCG daily.    Hyperlipidemia Continue with Crestor 2.5 mg daily   Elevated liver enzymes Monitor.    Goals of care discussion In view of her poor progression, multiple comorbidities and no  meaningful recovery from right thalamic strokes palliative care on board and appreciate recommendations.   DVT prophylaxis: Heparin Code Status: DNR Family Communication: (None at bedside. Disposition:    Status is: Inpatient  Remains inpatient appropriate because:Ongoing diagnostic testing needed not appropriate for outpatient work up   Dispo: The patient is from: Home              Anticipated d/c is to: SNF              Patient currently is not medically stable to d/c.   Difficult to place patient No       Consultants: Neurology  Palliative care Nephrology Urology IR  Procedures: Right percutaneous nephrostomy 2/21 Peg PLACEMENT.      Subjective: No change in mental status from yesterday.  Not in any kind of distress.   Objective: Vitals:   04/29/20 2330 04/30/20 0401 04/30/20 0754 04/30/20 0800  BP: 136/69 116/63 124/64 116/64  Pulse: 77 65 (!) 57 95  Resp:  18  20  Temp: 99.1 F (37.3 C) 98 F (36.7 C) 98 F (36.7 C) 98.1 F (36.7 C)  TempSrc: Axillary Oral Oral Axillary  SpO2: 97% 98% 100% 99%  Weight:      Height:        Intake/Output Summary (Last 24 hours) at 04/30/2020 1057 Last data filed at 04/30/2020 0900 Gross per 24 hour  Intake 1415 ml  Output 650 ml  Net 765 ml   Filed Weights   04/26/20 0332  Weight: 79.6 kg    Examination:  General exam: elderly lady, ill appearing, does not seem to be in any distress.  Respiratory system: diminished at bases, no wheezing heard.  Cardiovascular system: S1S2, RRR, no JVD,  Gastrointestinal system: Abdomen is soft, NT ND BS+ Central nervous system: pt not following commands, very lethargic. Marland Kitchen  Extremities: no cyanosis.  Skin:s/p nephrostomy tube.  Psychiatry: cannot be assessed.     Data Reviewed: I have personally reviewed following labs and imaging studies  CBC: Recent Labs  Lab 04/24/20 0458 04/29/20 0220  WBC 5.8 5.0  NEUTROABS  --  2.7  HGB 10.3* 11.2*  HCT 32.9* 36.4  MCV 99.4 99.7  PLT 267 316    Basic Metabolic Panel: Recent Labs  Lab 04/24/20 0458 04/26/20 0453 04/28/20 0540 04/29/20 0220  NA 135 136 137 136  K 4.4 5.8* 4.8 4.3  CL 98 99 98 100  CO2 28 28 30  29   GLUCOSE 178* 147* 154* 118*  BUN 35* 38* 38* 37*  CREATININE 0.89 0.95 0.96 0.93  CALCIUM 9.4 9.5 9.7 9.5    GFR: Estimated Creatinine Clearance: 44.2 mL/min (by C-G formula based on SCr of 0.93 mg/dL).  Liver Function Tests: Recent Labs  Lab 04/24/20 0458 04/26/20 0453 04/28/20 0540 04/29/20 0220  AST 19 26 22  87*  ALT 20 16 16  49*  ALKPHOS 144* 125 123 214*  BILITOT 0.7 0.7 0.5 0.2*  PROT 5.6* 5.6* 6.0* 5.8*  ALBUMIN 2.1* 2.2* 2.3* 2.3*    CBG: Recent Labs  Lab 04/29/20 1605 04/29/20 1926 04/29/20 2356 04/30/20 0413 04/30/20 0752  GLUCAP 142* 144* 167* 120* 127*     Recent Results (from the past 240 hour(s))  Culture, blood (Routine X 2) w Reflex to ID Panel     Status: None (Preliminary result)   Collection Time: 04/29/20  3:11 PM   Specimen: BLOOD RIGHT FOREARM  Result Value Ref Range Status   Specimen Description BLOOD RIGHT FOREARM  Final   Special Requests   Final    BOTTLES DRAWN AEROBIC ONLY Blood Culture adequate volume Performed at [facility], [address], [place], [place] [address]    Culture PENDING  Incomplete   Report Status PENDING  Incomplete         Radiology Studies: US RENAL  Result Date: 04/28/2020 CLINICAL DATA:  Evaluate for hydronephrosis. EXAM: RENAL / URINARY TRACT ULTRASOUND COMPLETE COMPARISON:  04/18/2020 FINDINGS: Right Kidney: Renal measurements: 9.3 x 4.8 x 4.9 cm = volume: 113.2 mL. Echogenicity within normal limits. No mass or hydronephrosis visualized. Inferior pole calculus measures 5 mm Left Kidney: Renal measurements: 9.0 x 4.3 x 4.5 cm = volume: 91.3 mL. Inferior pole not confidently identified due to overlying bowel gas. Echogenicity within normal limits. No mass or hydronephrosis visualized. Bladder: Bladder not visualized. Other: None. IMPRESSION: 1. No hydronephrosis identified. 2. Right renal stone measuring 5 mm. Electronically Signed   By: Signa Kell M.D.   On: 04/28/2020 17:13   DG CHEST PORT  1 VIEW  Result Date: 04/29/2020 CLINICAL DATA:  Fever. EXAM: PORTABLE CHEST 1 VIEW COMPARISON:  Radiograph 04/22/2020.  CT 04/23/2020 FINDINGS: Significant patient rotation limits assessment. Lower lung volumes from prior exam. Streaky bibasilar atelectasis appears similar. No new or progressive airspace disease. Stable heart size and mediastinal contours allowing for rotation. No pleural fluid or pneumothorax. Bilateral shoulder arthroplasties in place. IMPRESSION: Limited by patient rotation. Low lung volumes with streaky bibasilar atelectasis. Electronically Signed   By: Narda Rutherford M.D.   On: 04/29/2020 15:29        Scheduled Meds: . acetaminophen  650 mg Per NG tube Q6H  . amLODipine  5 mg Per Tube Daily  . chlorhexidine  15 mL Mouth Rinse BID  . Chlorhexidine Gluconate Cloth  6 each Topical Daily  . clopidogrel  75 mg Per Tube Daily  . feeding supplement (PROSource TF)  45 mL Per Tube TID  . free water  200 mL Per Tube Q6H  . heparin  5,000 Units Subcutaneous Q8H  . insulin aspart  0-9 Units Subcutaneous Q4H  . insulin aspart  2 Units Subcutaneous Q4H  . insulin detemir  5 Units Subcutaneous Daily  . levothyroxine  112 mcg Per Tube QAC breakfast  . liver oil-zinc oxide   Topical BID  . mouth rinse  15 mL Mouth Rinse q12n4p  . methocarbamol  750 mg Per Tube QID  . modafinil  200 mg Per Tube Daily  . oxyCODONE  2.5 mg Per Tube Q8H  . rosuvastatin  2.5 mg Per Tube q1800  . sodium chloride flush  5 mL Intracatheter Q8H   Continuous Infusions: . sodium chloride Stopped (04/23/20 0049)  . feeding supplement (GLUCERNA 1.2 CAL) 1,000 mL (04/30/20 0549)     LOS: 43 days       Kathlen Mody, MD [facility]   To contact the attending provider between 7A-7P or the covering provider during after hours 7P-7A, please log into the web site [email or web address] and access using universal [place] password for that web site. If you do not have the password, please call the  hospital operator.  04/30/2020, 10:57 AM

## 2020-04-30 NOTE — H&P (Signed)
Chief Complaint: Malfunctioning Nephrostomy tube  Referring Physician(s): Kathlen Mody  Supervising Physician: Richarda Overlie  Patient Status: [facility] - In-pt  History of Present Illness: Hannah Fischer is a 87 y.o. female who is admitted for altered mental status.  Medical issues include previous CVA with left hemiplegia, type 2 diabetes, and hypertension.  She underwent placement of a right PCN back on 03/27/20 for hydronephrosis.  It appears her PCN is clogged up as there is no urine in the bag.  We are asked to perform nephrostogram and possible exchange.  Past Medical History:  Diagnosis Date  . Abnormal nuclear stress test    2005, normal cath Dr. Elease Hashimoto, normal stress test 2012 Dr. Eldridge Dace  . Anemia    chronic  . Anxiety   . Arthritis    djd  . Arthritis of shoulder region, left, degenerative 12/03/2013  . Blood transfusion 1979   for anemia  . Chest pain    evaluated @ [facility] cardiology; had a stress test 2012  . Chronic pain syndrome 04/29/2019  . Chronic right-sided low back pain without sciatica 04/12/2019  . Complication of anesthesia    unable to breathe lying  flat on back  . Diabetes mellitus    type 2  niddm x 25 yrs  . Diabetes mellitus (HCC) 11/15/2011  . Diverticular disease   . DJD (degenerative joint disease)    lumbar and cervical  . GERD (gastroesophageal reflux disease)   . Hyperlipemia   . Hypertension    on medication  . Hypothyroidism    takes levoxyl  . Irritable bowel syndrome   . 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 Neuromuscular disorder (HCC)   . Neuropathy in diabetes (HCC)   . Nonspecific abnormal electrocardiogram (ECG) (EKG) 10/15/2013  . Other and unspecified angina pectoris    tests came back NOT heart related  . Pulmonary embolism (HCC) 06-2010   bilateral  . Pyuria 11/15/2011  . Seizures (HCC)    once, age 40  . TIA (transient ischemic attack) 11/15/2011    Past Surgical History:  Procedure Laterality  Date  . BACK SURGERY    . BLEPHAROPLASTY  2012   rt eye  . CARDIAC CATHETERIZATION     had one 10/2013  . CHOLECYSTECTOMY    . EYE SURGERY  2007   cataract ext/ iol rt eye  . IR GASTROSTOMY TUBE MOD SED  04/17/2020  . IR NEPHROSTOMY PLACEMENT RIGHT  03/27/2020  . IR US GUIDANCE  03/27/2020  . JOINT REPLACEMENT  2001,2005   both knees  . LEFT HEART CATHETERIZATION WITH CORONARY ANGIOGRAM N/A 10/18/2013   Procedure: LEFT HEART CATHETERIZATION WITH CORONARY ANGIOGRAM;  Surgeon: Corky Crafts, MD;  Location: [facility] CATH LAB;  Service: Cardiovascular;  Laterality: N/A;  . LUMBAR DISC SURGERY  1990  . REVERSE SHOULDER ARTHROPLASTY Left 12/03/2013   Procedure: LEFT SHOULDER REVERSE ARTHROPLASTY;  Surgeon: Verlee Rossetti, MD;  Location: [facility] OR;  Service: Orthopedics;  Laterality: Left;  . SHOULDER HEMI-ARTHROPLASTY  12/25/2010   Procedure: SHOULDER HEMI-ARTHROPLASTY;  Surgeon: Cammy Copa;  Location: [facility] OR;  Service: Orthopedics;  Laterality: Right;    Allergies: Atorvastatin, Ezetimibe-simvastatin, Lovastatin, Metformin hcl, Pravastatin, Codeine, Simvastatin, Sulfa antibiotics, and Sulfites  Medications: Prior to Admission medications   Medication Sig Start Date End Date Taking? Authorizing Provider  amLODipine (NORVASC) 5 MG tablet Take 5 mg by mouth every morning. 12/22/19  Yes [provider]  B Complex  Vitamins (VITAMIN B COMPLEX) TABS Take 1 tablet by mouth daily after breakfast.   Yes [provider]  Cholecalciferol (VITAMIN D3) 2000 UNITS TABS Take 2,000 Units by mouth daily.    Yes [provider]  clopidogrel (PLAVIX) 75 MG tablet Take 1 tablet (75 mg total) by mouth daily with breakfast. 11/17/11  Yes Vassie Loll, MD  DULoxetine (CYMBALTA) 30 MG capsule TAKE 1 CAPSULE(30 MG) BY MOUTH DAILY Patient taking differently: Take 30 mg by mouth daily. 08/25/19  Yes Marcello Fennel, MD  levothyroxine (SYNTHROID) 112 MCG tablet Take 112 mcg by mouth  daily before breakfast.   Yes [provider]  losartan (COZAAR) 100 MG tablet Take 100 mg by mouth daily. 04/29/19  Yes [provider]  nitrofurantoin (MACRODANTIN) 100 MG capsule Take 100 mg by mouth daily. 03/09/20  Yes [provider]  Omega-3 Fatty Acids (FISH OIL) 1000 MG CAPS Take 1,000 mg by mouth 2 (two) times daily.   Yes [provider]  pioglitazone (ACTOS) 45 MG tablet Take 45 mg by mouth daily.   Yes [provider]  propranolol (INDERAL) 40 MG tablet Take 40 mg by mouth daily.  04/13/17  Yes [provider]  traZODone (DESYREL) 50 MG tablet TAKE 1 TABLET BY MOUTH AT BEDTIME Patient taking differently: Take 50 mg by mouth at bedtime. 07/01/17  Yes Patel, Maryln Gottron, MD  levothyroxine (SYNTHROID, LEVOTHROID) 100 MCG tablet Take 1 tablet (100 mcg total) by mouth daily. Patient not taking: No sig reported 11/17/11   Vassie Loll, MD  methocarbamol (ROBAXIN) 500 MG tablet Take 1 tablet (500 mg total) by mouth every 8 (eight) hours as needed for muscle spasms. Patient not taking: No sig reported 04/13/19   Marcello Fennel, MD  rosuvastatin (CRESTOR) 5 MG tablet Take 1 tablet (5 mg total) by mouth 2 (two) times a week for 30 doses. 11/29/19 03/10/20  Azucena Fallen, MD     Family History  Problem Relation Age of Onset  . Diabetes Mother   . Heart disease Mother   . Heart disease Sister   . Heart disease Brother     Social History   Socioeconomic History  . Marital status: Married    Spouse name: Not on file  . Number of children: Not on file  . Years of education: Not on file  . Highest education level: Not on file  Occupational History  . Not on file  Tobacco Use  . Smoking status: Never Smoker  . Smokeless tobacco: Never Used  Substance and Sexual Activity  . Alcohol use: No  . Drug use: No  . Sexual activity: Never    Birth control/protection: Post-menopausal  Other Topics Concern  . Not on file  Social  History Narrative  . Not on file   Social Determinants of Health   Financial Resource Strain: Not on file  Food Insecurity: Not on file  Transportation Needs: Not on file  Physical Activity: Not on file  Stress: Not on file  Social Connections: Not on file    Review of Systems  Unable to perform ROS: Mental status change    Vital Signs: BP 116/64 (BP Location: Right Arm)   Pulse 95   Temp 98.1 F (36.7 C) (Axillary)   Resp 20   Ht 5\' 3"  (1.6 m)   Wt 79.6 kg   SpO2 99%   BMI 31.09 kg/m   Physical Exam Vitals reviewed.  Constitutional:      Appearance:  Normal appearance.     Comments: Somnolent  Cardiovascular:     Rate and Rhythm: Normal rate.  Pulmonary:     Effort: Pulmonary effort is normal. No respiratory distress.  Abdominal:     Palpations: Abdomen is soft.  Genitourinary:    Comments: Right PCN in place, appears to have blood in tubing,likely clogged. No urine in bag.    Imaging: DG Forearm Left  Result Date: 04/23/2020 CLINICAL DATA:  Left forearm pain. EXAM: LEFT FOREARM - 2 VIEW COMPARISON:  None. FINDINGS: There is no evidence of fracture or other focal bone lesions. Soft tissues are unremarkable. IMPRESSION: Negative. Electronically Signed   By: Romona Curls M.D.   On: 04/23/2020 15:14   DG Abd 1 View  Result Date: 04/04/2020 CLINICAL DATA:  Enteric catheter placement EXAM: ABDOMEN - 1 VIEW COMPARISON:  03/30/2020, 04/03/2020 FINDINGS: Frontal view of the lower chest and upper abdomen demonstrates stable position of the enteric catheter, tip overlying the gastroesophageal junction. Right-sided nephrostomy tube not appreciably changed. Dense calcification mitral annulus again noted. Bowel gas pattern is unremarkable. IMPRESSION: 1. Enteric catheter tip projecting over the gastroesophageal junction. 2. Stable right nephrostomy tube. Electronically Signed   By: Sharlet Salina M.D.   On: 04/04/2020 15:23   CT ANGIO CHEST PE W OR WO CONTRAST  Result Date:  04/23/2020 CLINICAL DATA:  Increased lethargy. Artery of [place] infarction. Tachypnea. EXAM: CT ANGIOGRAPHY CHEST WITH CONTRAST TECHNIQUE: Multidetector CT imaging of the chest was performed using the standard protocol during bolus administration of intravenous contrast. Multiplanar CT image reconstructions and MIPs were obtained to evaluate the vascular anatomy. CONTRAST:  OMNIPAQUE IOHEXOL 350 MG/ML SOLN COMPARISON:  Chest radiograph 04/22/2020 and CT chest from 03/25/2020 FINDINGS: Despite efforts by the technologist and patient, motion artifact is present on today's exam and could not be eliminated. This reduces exam sensitivity and specificity. Streak artifact from the patient's bilateral humeral implants also reduces to signal to noise ratio in a way that adversely impacts diagnostic sensitivity and specificity. Cardiovascular: No filling defect is identified in the pulmonary arterial tree to suggest pulmonary embolus. Coronary, aortic arch, and branch vessel atherosclerotic vascular disease. Mitral valve calcification. Mild aortic valve calcification. Mediastinum/Nodes: Unremarkable Lungs/Pleura: Mild dependent airspace opacity in the posterior basal segment right lower lobe probably from atelectasis. Linear subsegmental atelectasis or scarring in the posterior basal segment left lower lobe. Upper Abdomen: Unremarkable Musculoskeletal: Right proximal humeral prosthesis. Reverse left total shoulder prosthesis. Mild thoracic kyphosis. Thoracic spondylosis. Review of the MIP images confirms the above findings. IMPRESSION: 1. No filling defect is identified in the pulmonary arterial tree to suggest pulmonary embolus. 2. Coronary, aortic arch, and branch vessel atherosclerotic vascular disease. Mild aortic valve calcification. Mitral valve calcification. 3. Mild dependent airspace opacity in the posterior basal segment right lower lobe probably from atelectasis. 4. Linear subsegmental atelectasis or  scarring in the posterior basal segment left lower lobe. 5. Aortic atherosclerosis. Aortic Atherosclerosis (ICD10-I70.0). Electronically Signed   By: Gaylyn Rong M.D.   On: 04/23/2020 14:55   MR BRAIN WO CONTRAST  Result Date: 03/31/2020 CLINICAL DATA:  Stroke follow-up EXAM: MRI HEAD WITHOUT CONTRAST TECHNIQUE: Diffusion-weighted images obtained axial coronal planes. COMPARISON:  MRI of the brain March 19, 2020 FINDINGS: Mild restricted diffusion within the bilateral thalami consistent with expected evolution of known infarcts. No other focus of restricted diffusion identified. IMPRESSION: 1. No new acute infarct identified. 2. Mildly restricted diffusion within the bilateral thalami consistent with expected evolution of known infarcts.  Electronically Signed   By: Baldemar Lenis M.D.   On: 03/31/2020 12:07   US Abdomen Complete  Result Date: 04/18/2020 CLINICAL DATA:  Abnormal labs EXAM: ABDOMEN ULTRASOUND COMPLETE COMPARISON:  CT 03/25/2020 FINDINGS: Gallbladder: Surgically absent Common bile duct: Diameter: 8 mm Liver: Echogenicity within normal limits. No focal hepatic abnormality. Portal vein is patent on color Doppler imaging with normal direction of blood flow towards the liver. IVC: No abnormality visualized. Pancreas: Pancreatic duct is mildly dilated. Spleen: Size and appearance within normal limits. Right Kidney: Length: 8.9 cm. Echogenicity within normal limits. No mass or hydronephrosis visualized. Left Kidney: Length: 8.5 cm. Echogenicity within normal limits. No mass or hydronephrosis visualized. Abdominal aorta: No aneurysm visualized. Other findings: None. IMPRESSION: 1. Status post cholecystectomy. Common duct diameter within normal limits for post cholecystectomy patient. 2. Slight dilatation of pancreatic duct. 3. Previously noted right hydronephrosis on CT has resolved in this patient with history of percutaneous nephrostomy. Electronically Signed   By: Jasmine Pang M.D.   On: 04/18/2020 20:35   IR GASTROSTOMY TUBE MOD SED  Result Date: 04/17/2020 CLINICAL DATA:  Recent stroke, needs enteral feeding support EXAM: PERC PLACEMENT GASTROSTOMY FLUOROSCOPY TIME:  4 minutes 6 seconds; 18 mGy TECHNIQUE: The procedure, risks, benefits, and alternatives were explained to the family. Questions regarding the procedure were encouraged and answered. The family understands and consents to the procedure. As antibiotic prophylaxis, cefazolin 2 g was ordered pre-procedure and administered intravenously within one hour of incision. A safe percutaneous approach was confirmed on previous CT imaging. A 5 French angiographic catheter was placed as orogastric tube. The upper abdomen was prepped with Betadine, draped in usual sterile fashion, and infiltrated locally with 1% lidocaine. Intravenous Fentanyl and Versed 0.5mg  were administered as conscious sedation during continuous monitoring of the patient's level of consciousness and physiological / cardiorespiratory status by the radiology RN, with a total moderate sedation time of 11 minutes. Stomach was insufflated using air through the orogastric tube. An 61 French sheath needle was advanced percutaneously into the gastric lumen under fluoroscopy. Gas could be aspirated and a small contrast injection confirmed intraluminal spread. The sheath was exchanged over a guidewire for a 9 [place] vascular sheath, through which the snare device was advanced and used to snare a guidewire passed through the orogastric tube. This was withdrawn, and the snare attached to the 20 French pull-through gastrostomy tube, which was advanced antegrade, positioned with the internal bumper securing the anterior gastric wall to the anterior abdominal wall. Small contrast injection confirms appropriate positioning. The external bumper was applied and the catheter was flushed. COMPLICATIONS: COMPLICATIONS none IMPRESSION: 1. Technically successful 20 French  pull-through gastrostomy placement under fluoroscopy. Electronically Signed   By: Corlis Leak M.D.   On: 04/17/2020 10:28   US RENAL  Result Date: 04/28/2020 CLINICAL DATA:  Evaluate for hydronephrosis. EXAM: RENAL / URINARY TRACT ULTRASOUND COMPLETE COMPARISON:  04/18/2020 FINDINGS: Right Kidney: Renal measurements: 9.3 x 4.8 x 4.9 cm = volume: 113.2 mL. Echogenicity within normal limits. No mass or hydronephrosis visualized. Inferior pole calculus measures 5 mm Left Kidney: Renal measurements: 9.0 x 4.3 x 4.5 cm = volume: 91.3 mL. Inferior pole not confidently identified due to overlying bowel gas. Echogenicity within normal limits. No mass or hydronephrosis visualized. Bladder: Bladder not visualized. Other: None. IMPRESSION: 1. No hydronephrosis identified. 2. Right renal stone measuring 5 mm. Electronically Signed   By: Signa Kell M.D.   On: 04/28/2020 17:13   DG  CHEST PORT 1 VIEW  Result Date: 04/29/2020 CLINICAL DATA:  Fever. EXAM: PORTABLE CHEST 1 VIEW COMPARISON:  Radiograph 04/22/2020.  CT 04/23/2020 FINDINGS: Significant patient rotation limits assessment. Lower lung volumes from prior exam. Streaky bibasilar atelectasis appears similar. No new or progressive airspace disease. Stable heart size and mediastinal contours allowing for rotation. No pleural fluid or pneumothorax. Bilateral shoulder arthroplasties in place. IMPRESSION: Limited by patient rotation. Low lung volumes with streaky bibasilar atelectasis. Electronically Signed   By: Narda Rutherford M.D.   On: 04/29/2020 15:29   DG CHEST PORT 1 VIEW  Result Date: 04/22/2020 CLINICAL DATA:  Tachypnea EXAM: PORTABLE CHEST 1 VIEW COMPARISON:  04/17/2020 FINDINGS: Bilateral shoulder replacements. Mild cardiomegaly. No confluent opacities or effusions. No acute bony abnormality. IMPRESSION: Mild cardiomegaly.  No active disease. Electronically Signed   By: Charlett Nose M.D.   On: 04/22/2020 16:23   DG CHEST PORT 1 VIEW  Result Date:  04/17/2020 CLINICAL DATA:  Fever. EXAM: PORTABLE CHEST 1 VIEW COMPARISON:  Radiograph 04/09/2020.  CT 03/25/2020 FINDINGS: Stable heart size and mediastinal contours. Aortic atherosclerosis, mitral annulus calcifications. Improving bibasilar atelectasis. No confluent airspace disease. No pulmonary edema, pleural effusion or pneumothorax. Bilateral shoulder arthroplasties in place. IMPRESSION: Improving bibasilar atelectasis. Electronically Signed   By: Narda Rutherford M.D.   On: 04/17/2020 19:00   DG CHEST PORT 1 VIEW  Result Date: 04/09/2020 CLINICAL DATA:  Tachypnea. EXAM: PORTABLE CHEST 1 VIEW COMPARISON:  Radiograph 04/03/2020 FINDINGS: Weighted enteric tube with tip below the diaphragm not included in the field of view. Stable heart size and mediastinal contours. Mitral annulus calcifications. Minor bibasilar atelectasis. No pulmonary edema, large pleural effusion, or pneumothorax. Pigtail catheter in the right upper quadrant. Bilateral shoulder arthroplasties. IMPRESSION: Minor bibasilar atelectasis. Electronically Signed   By: Narda Rutherford M.D.   On: 04/09/2020 20:11   DG CHEST PORT 1 VIEW  Result Date: 04/03/2020 CLINICAL DATA:  Fever EXAM: PORTABLE CHEST 1 VIEW COMPARISON:  03/18/2020 FINDINGS: Feeding tube has been placed with the tip in the stomach. Bibasilar opacities, favor atelectasis. Heart is borderline in size. No effusions or pneumothorax. Bilateral shoulder replacements. No acute bony abnormality. IMPRESSION: Bibasilar opacities, favor atelectasis. Electronically Signed   By: Charlett Nose M.D.   On: 04/03/2020 08:26   DG Shoulder Left  Result Date: 04/23/2020 CLINICAL DATA:  Left shoulder pain. EXAM: LEFT SHOULDER - 2+ VIEW COMPARISON:  None. FINDINGS: The patient is status post a left shoulder arthroplasty. There is no evidence of fracture or dislocation. Soft tissues are unremarkable. IMPRESSION: No acute osseous injury. Electronically Signed   By: Romona Curls M.D.   On:  04/23/2020 15:15   DG Abd Portable 1V  Result Date: 04/05/2020 CLINICAL DATA:  Feeding tube placement EXAM: PORTABLE ABDOMEN - 1 VIEW COMPARISON:  Plain film from earlier same day. FINDINGS: Weighted tip feeding tube in place with tip now directed towards the expected region of the stomach pylorus/duodenal bulb. No dilated bowel loops are appreciated. IMPRESSION: Weighted tip feeding tube in place, repositioned compared to today's earlier exam, with tip now directed towards the expected region of the stomach pylorus/duodenal bulb. Electronically Signed   By: Bary Richard M.D.   On: 04/05/2020 16:56   DG Abd Portable 1V  Result Date: 04/05/2020 CLINICAL DATA:  Feeding tube placement. EXAM: PORTABLE ABDOMEN - 1 VIEW COMPARISON:  Single-view of the abdomen earlier today. FINDINGS: Feeding tube seen on the prior study has been advanced. The tube is now looped with the  tip projecting retrograde. There is a kink at the site of the loop. No other change. IMPRESSION: As above. Electronically Signed   By: Drusilla Kanner M.D.   On: 04/05/2020 13:46   DG Humerus Left  Result Date: 04/23/2020 CLINICAL DATA:  Pain in the left arm. EXAM: LEFT HUMERUS - 2+ VIEW COMPARISON:  None. FINDINGS: The patient is status post a left shoulder arthroplasty. There is no evidence of fracture or other focal bone lesions. Soft tissues are unremarkable. IMPRESSION: Negative. Electronically Signed   By: Romona Curls M.D.   On: 04/23/2020 15:14   VAS [place] UPPER EXTREMITY VENOUS DUPLEX  Result Date: 04/24/2020 UPPER VENOUS STUDY  Indications: Pain, and Swelling Limitations: Body habitus and edema, inability to flex arm. Comparison Study: No prior study Performing Technologist: Sherren Kerns RVS  Examination Guidelines: A complete evaluation includes B-mode imaging, spectral Doppler, color Doppler, and power Doppler as needed of all accessible portions of each vessel. Bilateral testing is considered an integral part of a complete  examination. Limited examinations for reoccurring indications may be performed as noted.  Right Findings: +----------+------------+---------+-----------+----------+-------+ RIGHT     CompressiblePhasicitySpontaneousPropertiesSummary +----------+------------+---------+-----------+----------+-------+ Subclavian               Yes       Yes                      +----------+------------+---------+-----------+----------+-------+  Left Findings: +----------+------------+---------+-----------+----------+--------------+ LEFT      CompressiblePhasicitySpontaneousProperties   Summary     +----------+------------+---------+-----------+----------+--------------+ IJV           Full       Yes       Yes                             +----------+------------+---------+-----------+----------+--------------+ Subclavian    Full       Yes       Yes                             +----------+------------+---------+-----------+----------+--------------+ Axillary      Full       Yes       Yes                             +----------+------------+---------+-----------+----------+--------------+ Brachial      Full       Yes       Yes                             +----------+------------+---------+-----------+----------+--------------+ Radial        Full                                                 +----------+------------+---------+-----------+----------+--------------+ Ulnar                                               Not visualized +----------+------------+---------+-----------+----------+--------------+ Cephalic      None                 No  Acute      +----------+------------+---------+-----------+----------+--------------+ Basilic       Full                                                 +----------+------------+---------+-----------+----------+--------------+  Summary:  Right: No evidence of thrombosis in the subclavian.  Left: No evidence of  deep vein thrombosis in the upper extremity. Findings consistent with acute superficial vein thrombosis involving the left cephalic vein.  *See table(s) above for measurements and observations.  Diagnosing physician: Lemar LivingsBrandon Cain MD Electronically signed by Lemar LivingsBrandon Cain MD on 04/24/2020 at 11:19:08 AM.    Final     Labs:  CBC: Recent Labs    04/22/20 0055 04/23/20 0845 04/24/20 0458 04/29/20 0220  WBC 5.8 5.7 5.8 5.0  HGB 10.9* 10.8* 10.3* 11.2*  HCT 33.4* 33.1* 32.9* 36.4  PLT 299 289 267 316    COAGS: Recent Labs    11/23/19 2304 03/18/20 1420 03/27/20 1009 04/17/20 0202  INR 0.9 1.0 1.0 1.1  APTT 27 26  --   --     BMP: Recent Labs    04/24/20 0458 04/26/20 0453 04/28/20 0540 04/29/20 0220  NA 135 136 137 136  K 4.4 5.8* 4.8 4.3  CL 98 99 98 100  CO2 28 28 30 29   GLUCOSE 178* 147* 154* 118*  BUN 35* 38* 38* 37*  CALCIUM 9.4 9.5 9.7 9.5  CREATININE 0.89 0.95 0.96 0.93  GFRNONAA >60 59* 58* >60    LIVER FUNCTION TESTS: Recent Labs    04/24/20 0458 04/26/20 0453 04/28/20 0540 04/29/20 0220  BILITOT 0.7 0.7 0.5 0.2*  AST 19 26 22  87*  ALT 20 16 16  49*  ALKPHOS 144* 125 123 214*  PROT 5.6* 5.6* 6.0* 5.8*  ALBUMIN 2.1* 2.2* 2.3* 2.3*    TUMOR MARKERS: No results for input(s): AFPTM, CEA, CA199, CHROMGRNA in the last 8760 hours.  Assessment and Plan:  Malfunctioning right nephrostomy tube.  Will proceed with nephrostogram and possible exchange today by Dr. Lowella DandyHenn.  Risks and benefits of right PCN exchange was discussed with the patient's husband  including, but not limited to, infection, bleeding, significant bleeding causing loss or decrease in renal function or damage to adjacent structures.   All questions were answered, patient is agreeable to proceed.  Consent signed and in chart.  Thank you for this interesting consult.  I greatly enjoyed meeting Hannah Fischer and look forward to participating in their care.  A copy of this report was sent  to the requesting provider on this date.  Electronically Signed: Gwynneth MacleodWENDY S Shaida Route, PA-C   04/30/2020, 10:11 AM      I spent a total of    15 Minutes in face to face in clinical consultation, greater than 50% of which was counseling/coordinating care for PCN exchange.

## 2020-04-30 NOTE — Procedures (Signed)
Interventional Radiology Procedure:   Indications: History of right hydronephrosis and right nephrostomy tube placement.  Tube has recently stopped draining.  Procedure: Right nephrostogram and attempted drain exchange.  Failed replacement of right nephrostomy.  Findings: Drain was pulled back on exam.  Contrast drained onto skin during injection.  Tube was completely dislodged from kidney.  Failed attempt to replace the catheter through existing access.  US demonstrated mild fullness in right renal pelvis but no significant calyceal dilatation.    Complications: None     EBL: None  Plan: Discussed with Dr. Blake Divine.  No immediate need for a nephrostomy tube.  Recommend surveillance [place] to look for recurrent hydronephrosis.     Lavonda Thal R. Lowella Dandy, MD  Pager: [phone]

## 2020-05-01 DIAGNOSIS — E119 Type 2 diabetes mellitus without complications: Secondary | ICD-10-CM | POA: Diagnosis not present

## 2020-05-01 DIAGNOSIS — N183 Chronic kidney disease, stage 3 unspecified: Secondary | ICD-10-CM | POA: Diagnosis not present

## 2020-05-01 DIAGNOSIS — D649 Anemia, unspecified: Secondary | ICD-10-CM | POA: Diagnosis not present

## 2020-05-01 DIAGNOSIS — G9341 Metabolic encephalopathy: Secondary | ICD-10-CM | POA: Diagnosis not present

## 2020-05-01 DIAGNOSIS — I63333 Cerebral infarction due to thrombosis of bilateral posterior cerebral arteries: Secondary | ICD-10-CM | POA: Diagnosis not present

## 2020-05-01 DIAGNOSIS — M6289 Other specified disorders of muscle: Secondary | ICD-10-CM | POA: Diagnosis not present

## 2020-05-01 DIAGNOSIS — R279 Unspecified lack of coordination: Secondary | ICD-10-CM | POA: Diagnosis not present

## 2020-05-01 DIAGNOSIS — N390 Urinary tract infection, site not specified: Secondary | ICD-10-CM | POA: Diagnosis not present

## 2020-05-01 DIAGNOSIS — I130 Hypertensive heart and chronic kidney disease with heart failure and stage 1 through stage 4 chronic kidney disease, or unspecified chronic kidney disease: Secondary | ICD-10-CM | POA: Diagnosis not present

## 2020-05-01 DIAGNOSIS — G9340 Encephalopathy, unspecified: Secondary | ICD-10-CM | POA: Diagnosis not present

## 2020-05-01 DIAGNOSIS — R509 Fever, unspecified: Secondary | ICD-10-CM | POA: Diagnosis not present

## 2020-05-01 DIAGNOSIS — I639 Cerebral infarction, unspecified: Secondary | ICD-10-CM | POA: Diagnosis not present

## 2020-05-01 DIAGNOSIS — R0989 Other specified symptoms and signs involving the circulatory and respiratory systems: Secondary | ICD-10-CM | POA: Diagnosis not present

## 2020-05-01 DIAGNOSIS — R059 Cough, unspecified: Secondary | ICD-10-CM | POA: Diagnosis not present

## 2020-05-01 DIAGNOSIS — E039 Hypothyroidism, unspecified: Secondary | ICD-10-CM | POA: Diagnosis not present

## 2020-05-01 DIAGNOSIS — N179 Acute kidney failure, unspecified: Secondary | ICD-10-CM | POA: Diagnosis not present

## 2020-05-01 DIAGNOSIS — J9811 Atelectasis: Secondary | ICD-10-CM | POA: Diagnosis not present

## 2020-05-01 DIAGNOSIS — J9600 Acute respiratory failure, unspecified whether with hypoxia or hypercapnia: Secondary | ICD-10-CM | POA: Diagnosis not present

## 2020-05-01 DIAGNOSIS — R5381 Other malaise: Secondary | ICD-10-CM | POA: Diagnosis not present

## 2020-05-01 DIAGNOSIS — R0602 Shortness of breath: Secondary | ICD-10-CM | POA: Diagnosis not present

## 2020-05-01 DIAGNOSIS — N189 Chronic kidney disease, unspecified: Secondary | ICD-10-CM | POA: Diagnosis not present

## 2020-05-01 DIAGNOSIS — I6340 Cerebral infarction due to embolism of unspecified cerebral artery: Secondary | ICD-10-CM | POA: Diagnosis not present

## 2020-05-01 DIAGNOSIS — I160 Hypertensive urgency: Secondary | ICD-10-CM | POA: Diagnosis not present

## 2020-05-01 DIAGNOSIS — N135 Crossing vessel and stricture of ureter without hydronephrosis: Secondary | ICD-10-CM | POA: Diagnosis present

## 2020-05-01 DIAGNOSIS — Z743 Need for continuous supervision: Secondary | ICD-10-CM | POA: Diagnosis not present

## 2020-05-01 LAB — GLUCOSE, CAPILLARY
Glucose-Capillary: 104 mg/dL — ABNORMAL HIGH (ref 70–99)
Glucose-Capillary: 157 mg/dL — ABNORMAL HIGH (ref 70–99)
Glucose-Capillary: 129 mg/dL — ABNORMAL HIGH (ref 70–99)
Glucose-Capillary: 162 mg/dL — ABNORMAL HIGH (ref 70–99)

## 2020-05-01 LAB — SARS CORONAVIRUS 2 (TAT 6-24 HRS): SARS Coronavirus 2: NEGATIVE

## 2020-05-01 MED ORDER — OXYCODONE HCL 5 MG PO TABS: 2.5000 mg | ORAL_TABLET | Freq: Three times a day (TID) | ORAL | 0 refills | Status: AC

## 2020-05-01 MED ORDER — PROSOURCE TF PO LIQD: 45.0000 mL | Freq: Three times a day (TID) | ORAL | Status: AC

## 2020-05-01 MED ORDER — MODAFINIL 200 MG PO TABS: 200.0000 mg | ORAL_TABLET | Freq: Every day | ORAL | 0 refills | Status: AC

## 2020-05-01 MED ORDER — INSULIN ASPART 100 UNIT/ML ~~LOC~~ SOLN: 0.0000 [IU] | SUBCUTANEOUS | 11 refills | Status: DC

## 2020-05-01 MED ORDER — CLOPIDOGREL BISULFATE 75 MG PO TABS: 75.0000 mg | ORAL_TABLET | Freq: Every day | ORAL | Status: AC

## 2020-05-01 MED ORDER — FREE WATER: 200.0000 mL | Freq: Four times a day (QID) | Status: DC

## 2020-05-01 MED ORDER — DOCUSATE SODIUM 50 MG/5ML PO LIQD: 100.0000 mg | Freq: Two times a day (BID) | ORAL | 0 refills | Status: AC | PRN

## 2020-05-01 MED ORDER — GLUCERNA 1.2 CAL PO LIQD: 1000.0000 mL | ORAL | Status: AC

## 2020-05-01 MED ORDER — INSULIN ASPART 100 UNIT/ML ~~LOC~~ SOLN: 2.0000 [IU] | SUBCUTANEOUS | 11 refills | Status: DC

## 2020-05-01 MED ORDER — AMLODIPINE BESYLATE 5 MG PO TABS: 5.0000 mg | ORAL_TABLET | Freq: Every day | ORAL | Status: AC

## 2020-05-01 MED ORDER — ACETAMINOPHEN 325 MG PO TABS: 650.0000 mg | ORAL_TABLET | Freq: Four times a day (QID) | ORAL | Status: AC

## 2020-05-01 MED ORDER — LEVOTHYROXINE SODIUM 112 MCG PO TABS: 112.0000 ug | ORAL_TABLET | Freq: Every day | ORAL | Status: AC

## 2020-05-01 MED ORDER — HYPROMELLOSE (GONIOSCOPIC) 2.5 % OP SOLN: 1.0000 [drp] | Freq: Four times a day (QID) | OPHTHALMIC | 12 refills | Status: AC | PRN

## 2020-05-01 MED ORDER — ZINC OXIDE 40 % EX OINT: TOPICAL_OINTMENT | Freq: Two times a day (BID) | CUTANEOUS | 0 refills | Status: AC

## 2020-05-01 MED ORDER — METHOCARBAMOL 750 MG PO TABS: 750.0000 mg | ORAL_TABLET | Freq: Four times a day (QID) | ORAL | 0 refills | Status: AC

## 2020-05-01 MED ORDER — POLYETHYLENE GLYCOL 3350 17 G PO PACK: 17.0000 g | PACK | Freq: Every day | ORAL | 0 refills | Status: AC | PRN

## 2020-05-01 MED ORDER — INSULIN DETEMIR 100 UNIT/ML ~~LOC~~ SOLN: 5.0000 [IU] | Freq: Every day | SUBCUTANEOUS | 11 refills | Status: DC

## 2020-05-01 MED ORDER — ROSUVASTATIN CALCIUM 5 MG PO TABS: 2.5000 mg | ORAL_TABLET | Freq: Every day | ORAL | Status: AC

## 2020-05-01 NOTE — Discharge Summary (Signed)
Physician Discharge Summary  Hannah Fischer VEL:[id number] DOB: August 22, 1932 DOA: 03/18/2020  PCP: [name], MD       Admit date: 03/18/2020 Discharge date: 05/01/2020  Time spent: 42 minutes  Recommendations for Outpatient Follow-up:  1. This patient has chronic UVJ obstruction and required a percutaneous nephrostomy tube during the hospitalization.  This tube has been removed and follow-up renal ultrasound without evidence of hydronephrosis.  Recommendation is to obtain a renal ultrasound in the outpatient setting in 1 month after discharge.  In addition electrolyte panel with creatinine should be obtained once weekly and if upward in creatinine has noted this could indicate recurrence of hydronephrosis therefore recommend obtaining a renal ultrasound sooner. 2. If follow-up renal ultrasound is abnormal patient will need to follow-up with Dr. [name] otherwise follow-up should be as needed. 3. [facility] 4. Patient needs to be scheduled for a follow-up appointment with Dr. [name] with neurology within 4 to 6 weeks after discharge regarding stroke follow-up.   Discharge Diagnoses:  Principal Problem:   Acute encephalopathy Active Problems:   Diabetes mellitus ([place])   Hypothyroidism   History of embolic stroke   Acute bilateral thalamic infarction ([place])   Dysphagia   CKD (chronic kidney disease), symptom management only, stage 3 (moderate) (HCC)   Hydronephrosis   Group B streptococcal UTI   Chronic diastolic heart failure (HCC)   Essential hypertension   Physical debility   Left hemiplegia ([place])   Palliative care by specialist   Goals of care, counseling/discussion   DNR (do not resuscitate)   Acute lower UTI   Chronic right Ureterovesical junction (UVJ) obstruction    Discharge Condition: Stable  Diet recommendation: Glucerna 1.2 Cal 45 cc/h with 200 cc of free water every 6 hours.  Filed Weights   04/26/20 0332   Weight: 79.6 kg    History of present illness:  87 year old lady with hypertension, hyperlipidemia, diabetes mellitus type 2, embolic CVAs with residual left hand weakness, hypothyroidism, GERD, PE who presentedto the hospital for increased lethargy. Shehad gone to bedin the morning her husbandhad trouble waking her up. In the ED she was noted to be responsive to pain. A code stroke was called. She was outside the window for TPA. An emergent CT did not show any acute abnormality. A CTA was performed and did not reveal any obvious stenosis. An MRI of the brain was performed on 2/13:Punctate acute infarction in the left superior cerebellum adjacent to the superior cerebellar peduncle. Bilateral thalamicacute infarction extending back into the mid brain. This is consistent with artery of Percheron infarction.She continues to wax and wane from a mental status standpoint. She has cortrak. Palliative and neuro are following,family eventually decided for PEG.   Patient has had improvement in pain and hypertonicity/spasticity after initiation of skeletal muscle relaxers.  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  Over the past several days prior to discharge urine output from this tube had decreased to 0.  Follow-up renal ultrasound without evidence of hydronephrosis.  Interventional radiology attempted to exchange out the percutaneous nephrostomy tube and this was left out.  Creatinine remained stable.  Discussed with Dr. [name] with urology who recommends leaving the nephrostomy tube out and obtaining renal ultrasound about 4 weeks after discharge.  Also agrees with recommendation to follow renal function once weekly.  If creatinine increasing recommendation is to repeat  renal ultrasound earlier.  She will only need to follow-up with urology if renal ultrasound  abnormal.  High threshold for reinsertion of nephrostomy tube given patient's overall poor prognosis and persistent altered mentation from sequelae due to stroke. - Hospital Course:  Acute problems: Bilateral Thalamic Infarcts Persistent acute metabolic encephalopathy  -due to CVA, mental status waxing and waning, at times she is minimally responsive versus talking some. Neurology following -Continue Provigil -She has a prior history of embolic infarcts with imaging on admission showed punctate acute infarction in the left superior cerebellum adjacent to the superior cerebellar peduncle. Bilateral thalamic acute infarction extending back into the midbrain, consistent with artery of Percheron infarction.  EEG 2/13 with moderate diffuse encephalopathy no apparent underlying seizure activity -Continue Plavix and low-dose Crestor; 3/24 LFTs normal  Hypertonicity and spasticity secondary to recent brain injury from stroke -Patient having significant hypertonicity of lower extremities which preclude OT and PT working effectively with her -She is also having spasticity related tremors in the upper extremities also impeding ability to participate with therapy -In addition with severe hypertonicity appears to be causing the patient pain -Skelaxin and Tizanadine dc'd 2/2 transaminitis.  -3/25 increased Robaxin to 750 mg QID (max dose allowed 8000 mg/24 hours) -Cont low-dose OxyIR 2.5 mg q 8 hrs  Left upper extremity swelling/acute SVT left cephalic vein -Patient with left upper extremity swelling which has now resolved -Venous duplex did reveal an acute SVT of the left cephalic vein at site of previous IV infiltration.  CKD 3A- AKI due to dehydration  Chronic Right UVJ obstruction w/Hydronephrosis  -Baseline creatinine ~ 1.0-1.3 -Earlier in the hospitalization due to steady rise of creatinine arenal ultrasound completed whichshowed right sided hydronephrosis(no mass or definite  calcification identified). -Nephrology, urology consulted -right nephrostomy tube placement by IR on 2/21. As of 3/25 minimal UOP but now has foley 2/2 urinary obstruction. Renal US without evidence of hydronephrosis.  Due to decreased urinary output IR attempted to exchange out nephrostomy tube on 3/26 but were unsuccessful therefore nephrostomy tube left out.  This was discussed with Dr. [name] with urology who recommended leaving tube out and repeating ultrasound in 1 month.  She will need to follow-up with urology in the outpatient setting if renal ultrasound abnormal.  We also have recommended following electrolyte panel weekly and if upward trend in creatinine noted recommendation is to pursue ultrasound sooner given this could reflect redevelopment of hydronephrosis.  Recurrent urinary retention -Required repeat I/O cath-foley inserted 3/14 w/ 900 cc returned. -Possibly recurred 2/2 meds which have been dc'd  Recent Group B strep UTI/recurrent fevers-acute MRSA UTI Resolved -GBS UTI from 2/12 appears to have been treated with ceftriaxone. 2/27 yeast UTI, completed fluconazole, WBC remains normal this morning  -Fever on 2/27, CXR with bibasilar opacities, urine culture showed yeast and completed fluconazole. Blood cultures1/2 with staph epidermidis (likely contaminated) -Repeat urine cx resulted 3/17 c/w MRSA sensitive to doxy which has been completed  Goals of care discussion  -with palliative team x2 last interaction on 3/7.  At that time despite being made aware of patient's poor status and poor prognosis her husband reported to the palliative provider that he was just not ready to let her go. Husband did wish for PEG placement  -Husband is agreeable to outpatient palliative provider follow-up in the event his wife further declines and would need referral to hospice care -Dtr in law states husband told by neurology that if pt were to improve and fully awaken it would take  at least 2  months -Patient was admitted on 2/12 and has not made any meaningful recovery.  She continues to have significant discomfort relating to difficult to control spasticity and hypertonicity with pharmacotherapy limited by recent issues related to transaminitis secondary to medications utilized for this issue.  Likely will need to have palliative medicine speak at length again with patient's husband and family on 2/12 since this is the benchmark that the husband is utilizing before he makes any further decisions about changing from aggressive care to a comfort care status.  Dysphagia/Nutrition Nutrition Status: Nutrition Problem: Inadequate oral intake Etiology: inability to eat Signs/Symptoms: NPO status Interventions: Tube feeding,Prostat Estimated body mass index is 31.09 kg/m as calculated from the following:   Height as of this encounter: 5' 3"  (1.6 m).   Weight as of this encounter: 79.6 kg. -Remains lethargic with most recent SLP evaluation on 3/7 recommended continued n.p.o. status-continue cortrack for tube feedings -s/p PEG 3/14  Diabetes mellitus 2 on current long-term insulin with hyperglycemia -Continue on insulin sliding scale every 4 hours while she is on tube feeds -Continue Levemir 5 units daily along with NovoLog 2 units every 4 hours  -HgbA1c = 7.6  Hypertension/chronic grade 2 diastolic heart failure -2D echoperformedduring this admission reveals grade 2 diastolic dysfunction -Continue Norvasc  Physical deconditioning/residual left hemiplegia from prior stroke PT eval 3/25:    Focus of session was repositioning in the bed, readjusting PRAFO's and hand splints, and PROM activity. Pt appeared somewhat painful with cervical stretching activity as well as any hand/wrist movement bilaterally. Pt was propped to the L to offload sacrum, and positioned in cervical neutral, HOB at 30. Pt responsive to pain but kept eyes closed throughout session. Pt not participating in  ROM activity however actively resisting movement bilaterally (L>R). Will continue to follow and progress as able per POC.     OT eval 3/23     Patient supine in bed, spouse at side. Engaged in OT/PT session, total assist for bed mobility to EOB. Seated EOB for pressure relief, posture and strengthening; worked on lateral leans, grooming tasks, and ROM of UEs. Total assist given HOH for grooming (washing face, hands) but spontaneously bringing L hand towards mouth. Gentle ROM of UEs, but resistive to extension at times. Keeps eyes closed during session, but following intermittent commands to initate return to midline. Donned R resting hand splint and L palm protector at end of session (B palm protectors on upon entry). Will follow acutely. SNF remains appropriate.       SLP eval 3/21 Pt continues to be minimally responsive. With total assist feeding of drips of water siphoned from a straw pt will seal lips of straw and swallow water. However, she never initiates sipping without total assist from SLP. After trials there is delayed weak coughing indicative of possible aspiration. Her arousal has been persistently insufficient since admit and pt has not met therapy goals over the past month. Will sign off at this time. Please reorder if pts status changes.  Other problems:  Intermittent agitation  -Has had issues with agitation when awake but for the most part remains lethargic -Agitation greatly improved after treatment of underlying pain secondary to hypertonicity  Gluteal skin breakdown -[place] and barrier cream -Continue air mattress to prevent further skin breakdown -turn as able     Wound / Incision (Open or Dehisced) 03/27/20 Puncture Flank Right;Posterior nephrostomy tube placement insertion site (Active)  Date First Assessed/Time First Assessed: 03/27/20 1406   Wound  Type: Puncture  Location: Flank  Location Orientation: Right;Posterior  Wound Description (Comments):  nephrostomy tube placement insertion site  Present on Admission: No    Assessments 03/27/2020  8:09 AM 04/27/2020  8:00 PM  Dressing Type Gauze (Comment) Gauze (Comment)  Dressing Changed New -  Dressing Status Clean;Dry;Intact Clean;Dry;Intact  Dressing Change Frequency - Daily  Site / Wound Assessment Clean;Dry Clean;Dry  Drainage Amount - None  Drainage Description - Serosanguineous     No Linked orders to display     Wound / Incision (Open or Dehisced) 04/17/20 Puncture Abdomen Left;Anterior;Upper G-tube insertion site  (Active)  Date First Assessed/Time First Assessed: 04/17/20 0915   Wound Type: Puncture  Location: Abdomen  Location Orientation: Left;Anterior;Upper  Wound Description (Comments): G-tube insertion site   Present on Admission: No    Assessments 04/17/2020  7:20 AM 04/27/2020  8:00 PM  Dressing Type Gauze (Comment);Tape dressing Gauze (Comment)  Dressing Changed New Changed  Dressing Status Clean;Dry;Intact Clean;Dry;Intact  Dressing Change Frequency PRN PRN  Site / Wound Assessment Dressing in place / Unable to assess Clean;Dry  Peri-wound Assessment Intact Intact  Closure - None  Drainage Amount None None     No Linked orders to display    Hypothyroidism -Continue Synthroid per tube -TSH 6.651 on 2/12 and likely an acute phase reactant consistent with sick euthyroid syndrome -TSH with T3 and T4 will need to be repeated in May 2022  Hyperkalemia Resolved -Potassium normal     Procedures: Right percutaneous nephrostomy 2/21 PEG tube placement  Consultations: Neurology  Palliative care Nephrology Urology IR aspirated I told her she seems to  Discharge Exam: Vitals:   05/01/20 0334 05/01/20 0747  BP: 96/80 124/73  Pulse: 92 77  Resp: 20 16  Temp: 98.2 F (36.8 C) 97.6 F (36.4 C)  SpO2: 99% 95%   Constitutional:  Lethargic, despite aggressive stimulation did not open eyes appears to not be in any acute distress Respiratory: Lungs are  clear to auscultation and she is stable on room air Cardiovascular: Normal heart sounds, pulse regular, normotensive.  Extremities warm and dry with adequate capillary refill Abdomen: PEG tube for feedings, nontender with normoactive bowel sounds.  LBM 3/26 Genitourinary:  Foley catheter in place draining dark yellow urine to bedside bag Neurologic: Cranial nerves II through XII intact.  Very lethargic today.  Does not open eyes.  Some spasticity when stimulated and left upper extremity.  Bilateral hand splints in place.  Prafo boot applied bilaterally by [employer] con 3/23 Psychiatric: Does not fully awaken during my interaction.  Unable to accurately assess orientation  Discharge Instructions   Discharge Instructions    Ambulatory referral to Neurology   Complete by: As directed    Follow up with Dr. [name] at [facility] in 4-6 weeks. Too complicated for RN to follow. Thanks.   Discharge instructions   Complete by: As directed    Please obtain renal ultrasound 4 weeks after discharge.  If abnormal patient will need to follow-up with urologist Dr. [name].  Please check electrolyte panel/BMET weekly.  If creatinine begins to rise please order renal ultrasound.   Increase activity slowly   Complete by: As directed    No wound care   Complete by: As directed      Allergies as of 05/01/2020      Reactions   Atorvastatin Other (See Comments)   "Aches"   Ezetimibe-simvastatin Other (See Comments)   "Aches"   Lovastatin Other (See Comments)   Weakness  Metformin Hcl Diarrhea   Pravastatin Other (See Comments)   Weakness   Codeine Anxiety, Other (See Comments)   Keeps the patient awake   Simvastatin Nausea Only, Other (See Comments)   Muscle weakness and an stomach upset   Sulfa Antibiotics Other (See Comments)   Gastric intolerance and caused stomach upset   Sulfites Other (See Comments)   Abdominal pain      Medication List    STOP taking these medications   DULoxetine 30 MG  capsule Commonly known as: CYMBALTA   Fish Oil 1000 MG Caps   losartan 100 MG tablet Commonly known as: COZAAR   nitrofurantoin 100 MG capsule Commonly known as: MACRODANTIN   pioglitazone 45 MG tablet Commonly known as: ACTOS   propranolol 40 MG tablet Commonly known as: INDERAL   traZODone 50 MG tablet Commonly known as: DESYREL   Vitamin B Complex Tabs   Vitamin D3 50 MCG (2000 UT) Tabs     TAKE these medications   acetaminophen 325 MG tablet Commonly known as: TYLENOL 2 tablets (650 mg total) by Per NG tube route every 6 (six) hours.   amLODipine 5 MG tablet Commonly known as: NORVASC Place 1 tablet (5 mg total) into feeding tube daily. Start taking on: May 02, 2020 What changed:   how to take this  when to take this   clopidogrel 75 MG tablet Commonly known as: PLAVIX Place 1 tablet (75 mg total) into feeding tube daily. Start taking on: May 02, 2020 What changed:   how to take this  when to take this   docusate 50 MG/5ML liquid Commonly known as: COLACE Place 10 mLs (100 mg total) into feeding tube 2 (two) times daily as needed for mild constipation.   feeding supplement (GLUCERNA 1.2 CAL) Liqd Place 1,000 mLs into feeding tube continuous.   feeding supplement (PROSource TF) liquid Place 45 mLs into feeding tube 3 (three) times daily.   free water Soln Place 200 mLs into feeding tube every 6 (six) hours.   hydroxypropyl methylcellulose / hypromellose 2.5 % ophthalmic solution Commonly known as: ISOPTO TEARS / GONIOVISC Place 1 drop into both eyes 4 (four) times daily as needed for dry eyes.   insulin aspart 100 UNIT/ML injection Commonly known as: novoLOG Inject 0-9 Units into the skin every 4 (four) hours. Correction coverage: Sensitive (thin, NPO, renal)  CBG < 70: Implement Hypoglycemia Standing Orders and refer to Hypoglycemia Standing Orders sidebar report  CBG 70 - 120: 0 units  CBG 121 - 150: 1 unit  CBG 151 - 200: 2  units  CBG 201 - 250: 3 units  CBG 251 - 300: 5 units  CBG 301 - 350: 7 units  CBG 351 - 400 9 units  CBG > 400 call MD and obtain STAT lab verification   insulin aspart 100 UNIT/ML injection Commonly known as: novoLOG Inject 2 Units into the skin every 4 (four) hours.   insulin detemir 100 UNIT/ML injection Commonly known as: LEVEMIR Inject 0.05 mLs (5 Units total) into the skin daily. Start taking on: May 02, 2020   levothyroxine 112 MCG tablet Commonly known as: SYNTHROID Place 1 tablet (112 mcg total) into feeding tube daily before breakfast. Start taking on: May 02, 2020 What changed:   how to take this  Another medication with the same name was removed. Continue taking this medication, and follow the directions you see here.   liver oil-zinc oxide 40 % ointment Commonly known as: DESITIN Apply  topically 2 (two) times daily.   methocarbamol 750 MG tablet Commonly known as: ROBAXIN Place 1 tablet (750 mg total) into feeding tube 4 (four) times daily. What changed:   medication strength  how much to take  how to take this  when to take this  reasons to take this   modafinil 200 MG tablet Commonly known as: PROVIGIL Place 1 tablet (200 mg total) into feeding tube daily. Start taking on: May 02, 2020   oxyCODONE 5 MG immediate release tablet Commonly known as: Oxy IR/ROXICODONE Place 0.5 tablets (2.5 mg total) into feeding tube every 8 (eight) hours.   polyethylene glycol 17 g packet Commonly known as: MIRALAX / GLYCOLAX Place 17 g into feeding tube daily as needed for moderate constipation.   rosuvastatin 5 MG tablet Commonly known as: CRESTOR Place 0.5 tablets (2.5 mg total) into feeding tube daily at 6 PM. What changed:   how much to take  how to take this  when to take this      Allergies  Allergen Reactions  . Atorvastatin Other (See Comments)    "Aches"  . Ezetimibe-Simvastatin Other (See Comments)    "Aches"  . Lovastatin  Other (See Comments)    Weakness  . Metformin Hcl Diarrhea  . Pravastatin Other (See Comments)    Weakness  . Codeine Anxiety and Other (See Comments)    Keeps the patient awake  . Simvastatin Nausea Only and Other (See Comments)    Muscle weakness and an stomach upset  . Sulfa Antibiotics Other (See Comments)    Gastric intolerance and caused stomach upset  . Sulfites Other (See Comments)    Abdominal pain    Follow-up Information    Garvin Fila, MD. Schedule an appointment as soon as possible for a visit in 6 week(s).   Specialties: Neurology, Radiology Contact information: [address] [place] [address] [phone]        [facility] Palliative Follow up.   Specialty: PALLIATIVE CARE Why: [facility] will follow the patient on an outpatient basis for patient / family support for complex medical needs at probable SNF facility. Contact information: [place]       Ardis Hughs, MD. Schedule an appointment as soon as possible for a visit.   Specialty: Urology Why: To be seen if follow-up renal ultrasound abnormal otherwise follow-up as needed. Contact information: [place] [address] [phone]                The results of significant diagnostics from this hospitalization (including imaging, microbiology, ancillary and laboratory) are listed below for reference.    Significant Diagnostic Studies: DG Forearm Left  Result Date: 04/23/2020 CLINICAL DATA:  Left forearm pain. EXAM: LEFT FOREARM - 2 VIEW COMPARISON:  None. FINDINGS: There is no evidence of fracture or other focal bone lesions. Soft tissues are unremarkable. IMPRESSION: Negative. Electronically Signed   By: Zerita Boers M.D.   On: 04/23/2020 15:14   DG Abd 1 View  Result Date: 04/04/2020 CLINICAL DATA:  Enteric catheter placement EXAM: ABDOMEN - 1 VIEW COMPARISON:  03/30/2020, 04/03/2020 FINDINGS:  Frontal view of the lower chest and upper abdomen demonstrates stable position of the enteric catheter, tip overlying the gastroesophageal junction. Right-sided nephrostomy tube not appreciably changed. Dense calcification mitral annulus again noted. Bowel gas pattern is unremarkable. IMPRESSION: 1. Enteric catheter tip projecting over the gastroesophageal junction. 2. Stable right nephrostomy tube. Electronically Signed  By: Randa Ngo M.D.   On: 04/04/2020 15:23   CT ANGIO CHEST PE W OR WO CONTRAST  Result Date: 04/23/2020 CLINICAL DATA:  Increased lethargy. Artery of Percheron infarction. Tachypnea. EXAM: CT ANGIOGRAPHY CHEST WITH CONTRAST TECHNIQUE: Multidetector CT imaging of the chest was performed using the standard protocol during bolus administration of intravenous contrast. Multiplanar CT image reconstructions and MIPs were obtained to evaluate the vascular anatomy. CONTRAST:  14m OMNIPAQUE IOHEXOL 350 MG/ML SOLN COMPARISON:  Chest radiograph 04/22/2020 and CT chest from 03/25/2020 FINDINGS: Despite efforts by the technologist and patient, motion artifact is present on today's exam and could not be eliminated. This reduces exam sensitivity and specificity. Streak artifact from the patient's bilateral humeral implants also reduces to signal to noise ratio in a way that adversely impacts diagnostic sensitivity and specificity. Cardiovascular: No filling defect is identified in the pulmonary arterial tree to suggest pulmonary embolus. Coronary, aortic arch, and branch vessel atherosclerotic vascular disease. Mitral valve calcification. Mild aortic valve calcification. Mediastinum/Nodes: Unremarkable Lungs/Pleura: Mild dependent airspace opacity in the posterior basal segment right lower lobe probably from atelectasis. Linear subsegmental atelectasis or scarring in the posterior basal segment left lower lobe. Upper Abdomen: Unremarkable Musculoskeletal: Right proximal humeral prosthesis. Reverse  left total shoulder prosthesis. Mild thoracic kyphosis. Thoracic spondylosis. Review of the MIP images confirms the above findings. IMPRESSION: 1. No filling defect is identified in the pulmonary arterial tree to suggest pulmonary embolus. 2. Coronary, aortic arch, and branch vessel atherosclerotic vascular disease. Mild aortic valve calcification. Mitral valve calcification. 3. Mild dependent airspace opacity in the posterior basal segment right lower lobe probably from atelectasis. 4. Linear subsegmental atelectasis or scarring in the posterior basal segment left lower lobe. 5. Aortic atherosclerosis. Aortic Atherosclerosis (ICD10-I70.0). Electronically Signed   By: WVan ClinesM.D.   On: 04/23/2020 14:55   [place]oreaAbdomen Complete  Result Date: 04/18/2020 CLINICAL DATA:  Abnormal labs EXAM: ABDOMEN ULTRASOUND COMPLETE COMPARISON:  CT 03/25/2020 FINDINGS: Gallbladder: Surgically absent Common bile duct: Diameter: 8 mm Liver: Echogenicity within normal limits. No focal hepatic abnormality. Portal vein is patent on color Doppler imaging with normal direction of blood flow towards the liver. IVC: No abnormality visualized. Pancreas: Pancreatic duct is mildly dilated. Spleen: Size and appearance within normal limits. Right Kidney: Length: 8.9 cm. Echogenicity within normal limits. No mass or hydronephrosis visualized. Left Kidney: Length: 8.5 cm. Echogenicity within normal limits. No mass or hydronephrosis visualized. Abdominal aorta: No aneurysm visualized. Other findings: None. IMPRESSION: 1. Status post cholecystectomy. Common duct diameter within normal limits for post cholecystectomy patient. 2. Slight dilatation of pancreatic duct. 3. Previously noted right hydronephrosis on CT has resolved in this patient with history of percutaneous nephrostomy. Electronically Signed   By: KDonavan FoilM.D.   On: 04/18/2020 20:35   IR GASTROSTOMY TUBE MOD SED  Result Date: 04/17/2020 CLINICAL DATA:  Recent stroke,  needs enteral feeding support EXAM: PERC PLACEMENT GASTROSTOMY FLUOROSCOPY TIME:  4 minutes 6 seconds; 18 mGy TECHNIQUE: The procedure, risks, benefits, and alternatives were explained to the family. Questions regarding the procedure were encouraged and answered. The family understands and consents to the procedure. As antibiotic prophylaxis, cefazolin 2 g was ordered pre-procedure and administered intravenously within one hour of incision. A safe percutaneous approach was confirmed on previous CT imaging. A 5 French angiographic catheter was placed as orogastric tube. The upper abdomen was prepped with Betadine, draped in usual sterile fashion, and infiltrated locally with 1% lidocaine. Intravenous Fentanyl 290m and Versed 0.82m8m  were administered as conscious sedation during continuous monitoring of the patient's level of consciousness and physiological / cardiorespiratory status by the radiology RN, with a total moderate sedation time of 11 minutes. Stomach was insufflated using air through the orogastric tube. An 55 French sheath needle was advanced percutaneously into the gastric lumen under fluoroscopy. Gas could be aspirated and a small contrast injection confirmed intraluminal spread. The sheath was exchanged over a guidewire for a 9 [place] vascular sheath, through which the snare device was advanced and used to snare a guidewire passed through the orogastric tube. This was withdrawn, and the snare attached to the 20 French pull-through gastrostomy tube, which was advanced antegrade, positioned with the internal bumper securing the anterior gastric wall to the anterior abdominal wall. Small contrast injection confirms appropriate positioning. The external bumper was applied and the catheter was flushed. COMPLICATIONS: COMPLICATIONS none IMPRESSION: 1. Technically successful 20 French pull-through gastrostomy placement under fluoroscopy. Electronically Signed   By: Lucrezia Europe M.D.   On: 04/17/2020 10:28    US RENAL  Result Date: 04/28/2020 CLINICAL DATA:  Evaluate for hydronephrosis. EXAM: RENAL / URINARY TRACT ULTRASOUND COMPLETE COMPARISON:  04/18/2020 FINDINGS: Right Kidney: Renal measurements: 9.3 x 4.8 x 4.9 cm = volume: 113.2 mL. Echogenicity within normal limits. No mass or hydronephrosis visualized. Inferior pole calculus measures 5 mm Left Kidney: Renal measurements: 9.0 x 4.3 x 4.5 cm = volume: 91.3 mL. Inferior pole not confidently identified due to overlying bowel gas. Echogenicity within normal limits. No mass or hydronephrosis visualized. Bladder: Bladder not visualized. Other: None. IMPRESSION: 1. No hydronephrosis identified. 2. Right renal stone measuring 5 mm. Electronically Signed   By: Kerby Moors M.D.   On: 04/28/2020 17:13   IR US Guidance  Result Date: 04/30/2020 INDICATION: 87 year old with history of right hydronephrosis. Percutaneous nephrostomy tube was placed on 03/27/2020. Right nephrostomy tube has stopped draining. Plan for nephrostogram and possible drain exchange. EXAM: 1. RIGHT NEPHROSTOGRAM AND ATTEMPTED REPLACEMENT OF THE NEPHROSTOMY TUBE 2. LIMITED ULTRASOUND OF THE RIGHT KIDNEY COMPARISON:  03/27/2020 MEDICATIONS: None ANESTHESIA/SEDATION: None CONTRAST:  10 mL Omnipaque 300 FLUOROSCOPY TIME:  Fluoroscopy Time: 4 minutes, 42 seconds, 44.0 mGy COMPLICATIONS: None immediate. PROCEDURE: Informed consent was obtained for nephrostogram and nephrostomy tube exchange. Patient was placed prone. The nephrostomy tube and surrounding skin were prepped and draped in sterile fashion. Maximal barrier sterile technique was utilized including caps, mask, sterile gowns, sterile gloves, sterile drape, hand hygiene and skin antiseptic. On examination, the tube was partially dislodged although the retention suture was still in place. Injection of contrast demonstrated contrast draining onto the skin rather than within the collecting system. Fluoroscopy also demonstrated that the tube was  dislodged from the kidney. The retention suture was cut. Catheter was cut. Wire would not advance through the tube. Eventually, the old nephrostomy tube was completely removed. Five [place] Kumpe catheter was advanced through the percutaneous site towards the right kidney. Bentson wire and Glidewire were used to try to cannulate the renal collecting system but this was unsuccessful. Contrast was injected in order to opacify the renal collecting system but this was unsuccessful. Ultrasound was prepped and the right kidney was evaluated with ultrasound. There was no significant hydronephrosis, therefore, the procedure was stopped and a bandage was placed at the old drain site. FINDINGS: Right nephrostomy tube was completely dislodged from the kidney and just barely underneath the skin. Unable to replace nephrostomy through the old drain site. Ultrasound demonstrated mild fullness of the right renal  pelvis but no significant calyceal dilatation. IMPRESSION: 1. Nephrostomy tube was dislodged from the right kidney. Unable to replace the nephrostomy tube. 2. Mild fullness of the right renal pelvis without significant caliceal dilatation. Discussed with Dr. Karleen Hampshire and no need to place another nephrostomy tube at this time. Consider surveillance ultrasound for recurrent hydronephrosis. Electronically Signed   By: Markus Daft M.D.   On: 04/30/2020 16:16   DG CHEST PORT 1 VIEW  Result Date: 04/29/2020 CLINICAL DATA:  Fever. EXAM: PORTABLE CHEST 1 VIEW COMPARISON:  Radiograph 04/22/2020.  CT 04/23/2020 FINDINGS: Significant patient rotation limits assessment. Lower lung volumes from prior exam. Streaky bibasilar atelectasis appears similar. No new or progressive airspace disease. Stable heart size and mediastinal contours allowing for rotation. No pleural fluid or pneumothorax. Bilateral shoulder arthroplasties in place. IMPRESSION: Limited by patient rotation. Low lung volumes with streaky bibasilar atelectasis.  Electronically Signed   By: Keith Rake M.D.   On: 04/29/2020 15:29   DG CHEST PORT 1 VIEW  Result Date: 04/22/2020 CLINICAL DATA:  Tachypnea EXAM: PORTABLE CHEST 1 VIEW COMPARISON:  04/17/2020 FINDINGS: Bilateral shoulder replacements. Mild cardiomegaly. No confluent opacities or effusions. No acute bony abnormality. IMPRESSION: Mild cardiomegaly.  No active disease. Electronically Signed   By: Rolm Baptise M.D.   On: 04/22/2020 16:23   DG CHEST PORT 1 VIEW  Result Date: 04/17/2020 CLINICAL DATA:  Fever. EXAM: PORTABLE CHEST 1 VIEW COMPARISON:  Radiograph 04/09/2020.  CT 03/25/2020 FINDINGS: Stable heart size and mediastinal contours. Aortic atherosclerosis, mitral annulus calcifications. Improving bibasilar atelectasis. No confluent airspace disease. No pulmonary edema, pleural effusion or pneumothorax. Bilateral shoulder arthroplasties in place. IMPRESSION: Improving bibasilar atelectasis. Electronically Signed   By: Keith Rake M.D.   On: 04/17/2020 19:00   DG CHEST PORT 1 VIEW  Result Date: 04/09/2020 CLINICAL DATA:  Tachypnea. EXAM: PORTABLE CHEST 1 VIEW COMPARISON:  Radiograph 04/03/2020 FINDINGS: Weighted enteric tube with tip below the diaphragm not included in the field of view. Stable heart size and mediastinal contours. Mitral annulus calcifications. Minor bibasilar atelectasis. No pulmonary edema, large pleural effusion, or pneumothorax. Pigtail catheter in the right upper quadrant. Bilateral shoulder arthroplasties. IMPRESSION: Minor bibasilar atelectasis. Electronically Signed   By: Keith Rake M.D.   On: 04/09/2020 20:11   DG CHEST PORT 1 VIEW  Result Date: 04/03/2020 CLINICAL DATA:  Fever EXAM: PORTABLE CHEST 1 VIEW COMPARISON:  03/18/2020 FINDINGS: Feeding tube has been placed with the tip in the stomach. Bibasilar opacities, favor atelectasis. Heart is borderline in size. No effusions or pneumothorax. Bilateral shoulder replacements. No acute bony abnormality.  IMPRESSION: Bibasilar opacities, favor atelectasis. Electronically Signed   By: Rolm Baptise M.D.   On: 04/03/2020 08:26   DG Shoulder Left  Result Date: 04/23/2020 CLINICAL DATA:  Left shoulder pain. EXAM: LEFT SHOULDER - 2+ VIEW COMPARISON:  None. FINDINGS: The patient is status post a left shoulder arthroplasty. There is no evidence of fracture or dislocation. Soft tissues are unremarkable. IMPRESSION: No acute osseous injury. Electronically Signed   By: Zerita Boers M.D.   On: 04/23/2020 15:15   DG Abd Portable 1V  Result Date: 04/05/2020 CLINICAL DATA:  Feeding tube placement EXAM: PORTABLE ABDOMEN - 1 VIEW COMPARISON:  Plain film from earlier same day. FINDINGS: Weighted tip feeding tube in place with tip now directed towards the expected region of the stomach pylorus/duodenal bulb. No dilated bowel loops are appreciated. IMPRESSION: Weighted tip feeding tube in place, repositioned compared to today's earlier exam, with tip now  directed towards the expected region of the stomach pylorus/duodenal bulb. Electronically Signed   By: Franki Cabot M.D.   On: 04/05/2020 16:56   DG Abd Portable 1V  Result Date: 04/05/2020 CLINICAL DATA:  Feeding tube placement. EXAM: PORTABLE ABDOMEN - 1 VIEW COMPARISON:  Single-view of the abdomen earlier today. FINDINGS: Feeding tube seen on the prior study has been advanced. The tube is now looped with the tip projecting retrograde. There is a kink at the site of the loop. No other change. IMPRESSION: As above. Electronically Signed   By: Inge Rise M.D.   On: 04/05/2020 13:46   DG Humerus Left  Result Date: 04/23/2020 CLINICAL DATA:  Pain in the left arm. EXAM: LEFT HUMERUS - 2+ VIEW COMPARISON:  None. FINDINGS: The patient is status post a left shoulder arthroplasty. There is no evidence of fracture or other focal bone lesions. Soft tissues are unremarkable. IMPRESSION: Negative. Electronically Signed   By: Zerita Boers M.D.   On: 04/23/2020 15:14   IR  NEPHROSTOGRAM RIGHT THRU EXISTING ACCESS  Result Date: 04/30/2020 INDICATION: 87 year old with history of right hydronephrosis. Percutaneous nephrostomy tube was placed on 03/27/2020. Right nephrostomy tube has stopped draining. Plan for nephrostogram and possible drain exchange. EXAM: 1. RIGHT NEPHROSTOGRAM AND ATTEMPTED REPLACEMENT OF THE NEPHROSTOMY TUBE 2. LIMITED ULTRASOUND OF THE RIGHT KIDNEY COMPARISON:  03/27/2020 MEDICATIONS: None ANESTHESIA/SEDATION: None CONTRAST:  10 mL Omnipaque 300 FLUOROSCOPY TIME:  Fluoroscopy Time: 4 minutes, 42 seconds, 07.8 mGy COMPLICATIONS: None immediate. PROCEDURE: Informed consent was obtained for nephrostogram and nephrostomy tube exchange. Patient was placed prone. The nephrostomy tube and surrounding skin were prepped and draped in sterile fashion. Maximal barrier sterile technique was utilized including caps, mask, sterile gowns, sterile gloves, sterile drape, hand hygiene and skin antiseptic. On examination, the tube was partially dislodged although the retention suture was still in place. Injection of contrast demonstrated contrast draining onto the skin rather than within the collecting system. Fluoroscopy also demonstrated that the tube was dislodged from the kidney. The retention suture was cut. Catheter was cut. Wire would not advance through the tube. Eventually, the old nephrostomy tube was completely removed. Five [place] Kumpe catheter was advanced through the percutaneous site towards the right kidney. Bentson wire and Glidewire were used to try to cannulate the renal collecting system but this was unsuccessful. Contrast was injected in order to opacify the renal collecting system but this was unsuccessful. Ultrasound was prepped and the right kidney was evaluated with ultrasound. There was no significant hydronephrosis, therefore, the procedure was stopped and a bandage was placed at the old drain site. FINDINGS: Right nephrostomy tube was completely  dislodged from the kidney and just barely underneath the skin. Unable to replace nephrostomy through the old drain site. Ultrasound demonstrated mild fullness of the right renal pelvis but no significant calyceal dilatation. IMPRESSION: 1. Nephrostomy tube was dislodged from the right kidney. Unable to replace the nephrostomy tube. 2. Mild fullness of the right renal pelvis without significant caliceal dilatation. Discussed with Dr. Karleen Hampshire and no need to place another nephrostomy tube at this time. Consider surveillance ultrasound for recurrent hydronephrosis. Electronically Signed   By: Markus Daft M.D.   On: 04/30/2020 16:16   VAS [place] UPPER EXTREMITY VENOUS DUPLEX  Result Date: 04/24/2020 UPPER VENOUS STUDY  Indications: Pain, and Swelling Limitations: Body habitus and edema, inability to flex arm. Comparison Study: No prior study Performing Technologist: Sharion Dove RVS  Examination Guidelines: A complete evaluation includes B-mode imaging, spectral Doppler,  color Doppler, and power Doppler as needed of all accessible portions of each vessel. Bilateral testing is considered an integral part of a complete examination. Limited examinations for reoccurring indications may be performed as noted.  Right Findings: +----------+------------+---------+-----------+----------+-------+ RIGHT     CompressiblePhasicitySpontaneousPropertiesSummary +----------+------------+---------+-----------+----------+-------+ Subclavian               Yes       Yes                      +----------+------------+---------+-----------+----------+-------+  Left Findings: +----------+------------+---------+-----------+----------+--------------+ LEFT      CompressiblePhasicitySpontaneousProperties   Summary     +----------+------------+---------+-----------+----------+--------------+ IJV           Full       Yes       Yes                              +----------+------------+---------+-----------+----------+--------------+ Subclavian    Full       Yes       Yes                             +----------+------------+---------+-----------+----------+--------------+ Axillary      Full       Yes       Yes                             +----------+------------+---------+-----------+----------+--------------+ Brachial      Full       Yes       Yes                             +----------+------------+---------+-----------+----------+--------------+ Radial        Full                                                 +----------+------------+---------+-----------+----------+--------------+ Ulnar                                               Not visualized +----------+------------+---------+-----------+----------+--------------+ Cephalic      None                 No                   Acute      +----------+------------+---------+-----------+----------+--------------+ Basilic       Full                                                 +----------+------------+---------+-----------+----------+--------------+  Summary:  Right: No evidence of thrombosis in the subclavian.  Left: No evidence of deep vein thrombosis in the upper extremity. Findings consistent with acute superficial vein thrombosis involving the left cephalic vein.  *See table(s) above for measurements and observations.  Diagnosing physician: Servando Snare MD Electronically signed by Servando Snare MD on 04/24/2020 at 11:19:08 AM.    Final     Microbiology: Recent Results (from  the past 240 hour(s))  Culture, blood (Routine X 2) w Reflex to ID Panel     Status: None (Preliminary result)   Collection Time: 04/29/20  3:10 PM   Specimen: BLOOD  Result Value Ref Range Status   Specimen Description BLOOD RIGHT ANTECUBITAL  Final   Special Requests   Final    BOTTLES DRAWN AEROBIC ONLY Blood Culture adequate volume   Culture   Final    NO GROWTH 2 DAYS Performed at  [facility], [place] [address] [place], [place] [address]    Report Status PENDING  Incomplete  Culture, blood (Routine X 2) w Reflex to ID Panel     Status: None (Preliminary result)   Collection Time: 04/29/20  3:11 PM   Specimen: BLOOD RIGHT FOREARM  Result Value Ref Range Status   Specimen Description BLOOD RIGHT FOREARM  Final   Special Requests   Final    BOTTLES DRAWN AEROBIC ONLY Blood Culture adequate volume   Culture   Final    NO GROWTH 2 DAYS Performed at [facility], [place] [address] [place], [place] [address]    Report Status PENDING  Incomplete     Labs: Basic Metabolic Panel: Recent Labs  Lab 04/26/20 0453 04/28/20 0540 04/29/20 0220  NA 136 137 136  K 5.8* 4.8 4.3  CL 99 98 100  CO2 28 30 29   GLUCOSE 147* 154* 118*  BUN 38* 38* 37*  CREATININE 0.95 0.96 0.93  CALCIUM 9.5 9.7 9.5   Liver Function Tests: Recent Labs  Lab 04/26/20 0453 04/28/20 0540 04/29/20 0220  AST 26 22 87*  ALT 16 16 49*  ALKPHOS 125 123 214*  BILITOT 0.7 0.5 0.2*  PROT 5.6* 6.0* 5.8*  ALBUMIN 2.2* 2.3* 2.3*   No results for input(s): LIPASE, AMYLASE in the last 168 hours. No results for input(s): AMMONIA in the last 168 hours. CBC: Recent Labs  Lab 04/29/20 0220  WBC 5.0  NEUTROABS 2.7  HGB 11.2*  HCT 36.4  MCV 99.7  PLT 316   Cardiac Enzymes: No results for input(s): CKTOTAL, CKMB, CKMBINDEX, TROPONINI in the last 168 hours. BNP: BNP (last 3 results) No results for input(s): BNP in the last 8760 hours.  ProBNP (last 3 results) No results for input(s): PROBNP in the last 8760 hours.  CBG: Recent Labs  Lab 04/30/20 1531 04/30/20 2018 04/30/20 2314 05/01/20 0402 05/01/20 0749  GLUCAP 163* 165* 126* 104* 157*       Signed:  Erin Hearing ANP [facility] 05/01/2020, 10:40 AM

## 2020-05-01 NOTE — TOC Progression Note (Addendum)
Transition of Care [facility]) - Progression Note    Patient Details  Name: Hannah Fischer MRN: [id number] Date of Birth: 03-29-1932  Transition of Care [facility] CM/SW Contact  Janae Bridgeman, RN Phone Number: 05/01/2020, 8:11 AM  Clinical Narrative:    Case management received insurance authorization approval for placement at [facility] SN[facility] - NOM#[id number] for 04/29/2020 - 05/02/2020.  [facility] should have an available ready bed for the patient on 05/02/2020.  I sent updated progress notes and clinicals to [facility] to the attention of Meryle Ready, CM at [facility] through the hub and fax.  CM will continue to follow the patient for [facility] placement.  05/01/2020 1018 - CM spoke with Meryle Ready, CM at [facility] and the facility has an available admission bed for the patient today.  I spoke with Junious Silk, NP and she is aware - COVID screen will be ordered and will need to be collected by nursing staff this morning and resulted out before discharge.  I called and spoke with Reuel Boom, RN on the unit and he will collect the specimen as ordered.  CM called radiology and the patient renal US needs to be ordered by [facility] after the patient is discharged from the hospital.  Junious Silk, NP is aware and placed a call with Dr. Marlou Porch.  I called and spoke with the patient's husband and daughter, Hannah Fischer, on the phone and explained that the patient will be able to discharge to the facility today for short term rehabilitation and then transition over to private pay once the authorization for short term placement is exhausted.  The patient's family is aware and are willing to pay private pay for LTC placement at [facility] if needed.  Reuel Boom, RN on [id number] will collect the COVID screen.  Bedside nursing can call report to [facility] at [phone], since she will be transferring to the SNF facility after COVID complete and will be transported by [facility] when ready for  discharge.  05/01/2020 1444 - I called and spoke with Meryle Ready, CM at [facility] and the facility is ready for CM to set up transportation with COVID screen pending.  I called and spoke with [facility] and transportation was schedule.  [facility] called and states that the ambulance plans to pick the patient around 4 pm today. I called and spoke with the patient's family and Reuel Boom, bedside nurse and they are aware of transport time.  The family is taking the patient's dentures home.  Reuel Boom, RN is placing an extra bottle of tube feeds in the belongings bag to transport to the facility until they receive supply of tube feeds after ordered.  CM will continue to follow for transport to [facility] by [facility].   Expected Discharge Plan: Skilled Nursing Facility Barriers to Discharge: Continued Medical Work up (Patient scheduled for PEG placement on 04/17/2020)  Expected Discharge Plan and Services Expected Discharge Plan: Skilled Nursing Facility In-house Referral: Clinical Social Work,Hospice / Palliative Care Discharge Planning Services: CM Consult Post Acute Care Choice: Skilled Nursing Facility Living arrangements for the past 2 months: Skilled Nursing Facility                                       Social Determinants of Health (SDOH) Interventions    Readmission Risk Interventions Readmission Risk Prevention Plan 04/13/2020  Transportation Screening Complete  Medication Review [facility]) Complete  PCP or Specialist appointment within 3-5 days of discharge Complete  HRI or Home Care Consult Complete  SW Recovery Care/Counseling Consult Complete  Palliative Care Screening Complete  Skilled Nursing Facility Complete  Some recent data might be hidden

## 2020-05-01 NOTE — Plan of Care (Signed)
Max assist with adls. Foley catheter remains for retention.

## 2020-05-03 DIAGNOSIS — M6289 Other specified disorders of muscle: Secondary | ICD-10-CM | POA: Diagnosis not present

## 2020-05-03 DIAGNOSIS — N179 Acute kidney failure, unspecified: Secondary | ICD-10-CM | POA: Diagnosis not present

## 2020-05-03 DIAGNOSIS — N189 Chronic kidney disease, unspecified: Secondary | ICD-10-CM | POA: Diagnosis not present

## 2020-05-03 DIAGNOSIS — G9341 Metabolic encephalopathy: Secondary | ICD-10-CM | POA: Diagnosis not present

## 2020-05-04 DIAGNOSIS — N189 Chronic kidney disease, unspecified: Secondary | ICD-10-CM | POA: Diagnosis not present

## 2020-05-04 DIAGNOSIS — M6289 Other specified disorders of muscle: Secondary | ICD-10-CM | POA: Diagnosis not present

## 2020-05-04 DIAGNOSIS — N179 Acute kidney failure, unspecified: Secondary | ICD-10-CM | POA: Diagnosis not present

## 2020-05-04 DIAGNOSIS — G9341 Metabolic encephalopathy: Secondary | ICD-10-CM | POA: Diagnosis not present

## 2020-05-04 LAB — CULTURE, BLOOD (ROUTINE X 2)
Culture: NO GROWTH
Culture: NO GROWTH
Special Requests: ADEQUATE
Special Requests: ADEQUATE

## 2020-05-10 DIAGNOSIS — J9600 Acute respiratory failure, unspecified whether with hypoxia or hypercapnia: Secondary | ICD-10-CM | POA: Diagnosis not present

## 2020-05-10 DIAGNOSIS — N179 Acute kidney failure, unspecified: Secondary | ICD-10-CM | POA: Diagnosis not present

## 2020-05-10 DIAGNOSIS — M6289 Other specified disorders of muscle: Secondary | ICD-10-CM | POA: Diagnosis not present

## 2020-05-10 DIAGNOSIS — G9341 Metabolic encephalopathy: Secondary | ICD-10-CM | POA: Diagnosis not present

## 2020-05-15 DIAGNOSIS — M6289 Other specified disorders of muscle: Secondary | ICD-10-CM | POA: Diagnosis not present

## 2020-05-15 DIAGNOSIS — N179 Acute kidney failure, unspecified: Secondary | ICD-10-CM | POA: Diagnosis not present

## 2020-05-15 DIAGNOSIS — J9600 Acute respiratory failure, unspecified whether with hypoxia or hypercapnia: Secondary | ICD-10-CM | POA: Diagnosis not present

## 2020-05-15 DIAGNOSIS — N189 Chronic kidney disease, unspecified: Secondary | ICD-10-CM | POA: Diagnosis not present

## 2020-05-17 DIAGNOSIS — J9600 Acute respiratory failure, unspecified whether with hypoxia or hypercapnia: Secondary | ICD-10-CM | POA: Diagnosis not present

## 2020-05-17 DIAGNOSIS — N390 Urinary tract infection, site not specified: Secondary | ICD-10-CM | POA: Diagnosis not present

## 2020-05-17 DIAGNOSIS — M6289 Other specified disorders of muscle: Secondary | ICD-10-CM | POA: Diagnosis not present

## 2020-05-17 DIAGNOSIS — E039 Hypothyroidism, unspecified: Secondary | ICD-10-CM | POA: Diagnosis not present

## 2020-05-22 DIAGNOSIS — N390 Urinary tract infection, site not specified: Secondary | ICD-10-CM | POA: Diagnosis not present

## 2020-05-22 DIAGNOSIS — N179 Acute kidney failure, unspecified: Secondary | ICD-10-CM | POA: Diagnosis not present

## 2020-05-22 DIAGNOSIS — J9811 Atelectasis: Secondary | ICD-10-CM | POA: Diagnosis not present

## 2020-05-22 DIAGNOSIS — J9600 Acute respiratory failure, unspecified whether with hypoxia or hypercapnia: Secondary | ICD-10-CM | POA: Diagnosis not present

## 2020-05-24 DIAGNOSIS — N390 Urinary tract infection, site not specified: Secondary | ICD-10-CM | POA: Diagnosis not present

## 2020-05-24 DIAGNOSIS — N189 Chronic kidney disease, unspecified: Secondary | ICD-10-CM | POA: Diagnosis not present

## 2020-05-24 DIAGNOSIS — J9811 Atelectasis: Secondary | ICD-10-CM | POA: Diagnosis not present

## 2020-05-24 DIAGNOSIS — R0989 Other specified symptoms and signs involving the circulatory and respiratory systems: Secondary | ICD-10-CM | POA: Diagnosis not present

## 2020-05-30 DIAGNOSIS — N19 Unspecified kidney failure: Secondary | ICD-10-CM | POA: Diagnosis not present

## 2020-05-30 DIAGNOSIS — E119 Type 2 diabetes mellitus without complications: Secondary | ICD-10-CM | POA: Diagnosis not present

## 2020-05-30 DIAGNOSIS — I130 Hypertensive heart and chronic kidney disease with heart failure and stage 1 through stage 4 chronic kidney disease, or unspecified chronic kidney disease: Secondary | ICD-10-CM | POA: Diagnosis not present

## 2020-05-30 DIAGNOSIS — D649 Anemia, unspecified: Secondary | ICD-10-CM | POA: Diagnosis not present

## 2020-05-30 DIAGNOSIS — R079 Chest pain, unspecified: Secondary | ICD-10-CM | POA: Diagnosis not present

## 2020-06-05 DIAGNOSIS — N189 Chronic kidney disease, unspecified: Secondary | ICD-10-CM | POA: Diagnosis not present

## 2020-06-05 DIAGNOSIS — I10 Essential (primary) hypertension: Secondary | ICD-10-CM | POA: Diagnosis not present

## 2020-06-05 DIAGNOSIS — J9600 Acute respiratory failure, unspecified whether with hypoxia or hypercapnia: Secondary | ICD-10-CM | POA: Diagnosis not present

## 2020-06-05 DIAGNOSIS — E039 Hypothyroidism, unspecified: Secondary | ICD-10-CM | POA: Diagnosis not present

## 2020-06-06 DIAGNOSIS — D649 Anemia, unspecified: Secondary | ICD-10-CM | POA: Diagnosis not present

## 2020-06-06 DIAGNOSIS — E039 Hypothyroidism, unspecified: Secondary | ICD-10-CM | POA: Diagnosis not present

## 2020-06-06 DIAGNOSIS — I130 Hypertensive heart and chronic kidney disease with heart failure and stage 1 through stage 4 chronic kidney disease, or unspecified chronic kidney disease: Secondary | ICD-10-CM | POA: Diagnosis not present

## 2020-06-07 DIAGNOSIS — N189 Chronic kidney disease, unspecified: Secondary | ICD-10-CM | POA: Diagnosis not present

## 2020-06-07 DIAGNOSIS — I10 Essential (primary) hypertension: Secondary | ICD-10-CM | POA: Diagnosis not present

## 2020-06-07 DIAGNOSIS — J9600 Acute respiratory failure, unspecified whether with hypoxia or hypercapnia: Secondary | ICD-10-CM | POA: Diagnosis not present

## 2020-06-07 DIAGNOSIS — M6289 Other specified disorders of muscle: Secondary | ICD-10-CM | POA: Diagnosis not present

## 2020-06-13 DIAGNOSIS — N183 Chronic kidney disease, stage 3 unspecified: Secondary | ICD-10-CM | POA: Diagnosis not present

## 2020-06-13 DIAGNOSIS — N139 Obstructive and reflux uropathy, unspecified: Secondary | ICD-10-CM | POA: Diagnosis not present

## 2020-06-13 DIAGNOSIS — I5032 Chronic diastolic (congestive) heart failure: Secondary | ICD-10-CM | POA: Diagnosis not present

## 2020-06-14 DIAGNOSIS — J9600 Acute respiratory failure, unspecified whether with hypoxia or hypercapnia: Secondary | ICD-10-CM | POA: Diagnosis not present

## 2020-06-14 DIAGNOSIS — E119 Type 2 diabetes mellitus without complications: Secondary | ICD-10-CM | POA: Diagnosis not present

## 2020-06-14 DIAGNOSIS — N189 Chronic kidney disease, unspecified: Secondary | ICD-10-CM | POA: Diagnosis not present

## 2020-06-14 DIAGNOSIS — M79672 Pain in left foot: Secondary | ICD-10-CM | POA: Diagnosis not present

## 2020-06-14 DIAGNOSIS — M6289 Other specified disorders of muscle: Secondary | ICD-10-CM | POA: Diagnosis not present

## 2020-06-14 DIAGNOSIS — I10 Essential (primary) hypertension: Secondary | ICD-10-CM | POA: Diagnosis not present

## 2020-06-15 DIAGNOSIS — E039 Hypothyroidism, unspecified: Secondary | ICD-10-CM | POA: Diagnosis not present

## 2020-06-15 DIAGNOSIS — N189 Chronic kidney disease, unspecified: Secondary | ICD-10-CM | POA: Diagnosis not present

## 2020-06-15 DIAGNOSIS — I10 Essential (primary) hypertension: Secondary | ICD-10-CM | POA: Diagnosis not present

## 2020-06-15 DIAGNOSIS — E785 Hyperlipidemia, unspecified: Secondary | ICD-10-CM | POA: Diagnosis not present

## 2020-06-20 DIAGNOSIS — D649 Anemia, unspecified: Secondary | ICD-10-CM | POA: Diagnosis not present

## 2020-06-20 DIAGNOSIS — I130 Hypertensive heart and chronic kidney disease with heart failure and stage 1 through stage 4 chronic kidney disease, or unspecified chronic kidney disease: Secondary | ICD-10-CM | POA: Diagnosis not present

## 2020-06-20 DIAGNOSIS — I5032 Chronic diastolic (congestive) heart failure: Secondary | ICD-10-CM | POA: Diagnosis not present

## 2020-06-26 DIAGNOSIS — N189 Chronic kidney disease, unspecified: Secondary | ICD-10-CM | POA: Diagnosis not present

## 2020-06-26 DIAGNOSIS — M6289 Other specified disorders of muscle: Secondary | ICD-10-CM | POA: Diagnosis not present

## 2020-06-26 DIAGNOSIS — D649 Anemia, unspecified: Secondary | ICD-10-CM | POA: Diagnosis not present

## 2020-06-26 DIAGNOSIS — L0390 Cellulitis, unspecified: Secondary | ICD-10-CM | POA: Diagnosis not present

## 2020-06-27 DIAGNOSIS — D649 Anemia, unspecified: Secondary | ICD-10-CM | POA: Diagnosis not present

## 2020-06-27 DIAGNOSIS — D519 Vitamin B12 deficiency anemia, unspecified: Secondary | ICD-10-CM | POA: Diagnosis not present

## 2020-07-04 DIAGNOSIS — D649 Anemia, unspecified: Secondary | ICD-10-CM | POA: Diagnosis not present

## 2020-07-04 DIAGNOSIS — I130 Hypertensive heart and chronic kidney disease with heart failure and stage 1 through stage 4 chronic kidney disease, or unspecified chronic kidney disease: Secondary | ICD-10-CM | POA: Diagnosis not present

## 2020-07-10 ENCOUNTER — Institutional Professional Consult (permissible substitution): Payer: Medicare Other | Admitting: Neurology

## 2020-07-12 DIAGNOSIS — D649 Anemia, unspecified: Secondary | ICD-10-CM | POA: Diagnosis not present

## 2020-07-13 DIAGNOSIS — L0390 Cellulitis, unspecified: Secondary | ICD-10-CM | POA: Diagnosis not present

## 2020-07-13 DIAGNOSIS — M6289 Other specified disorders of muscle: Secondary | ICD-10-CM | POA: Diagnosis not present

## 2020-07-13 DIAGNOSIS — D649 Anemia, unspecified: Secondary | ICD-10-CM | POA: Diagnosis not present

## 2020-07-13 DIAGNOSIS — E039 Hypothyroidism, unspecified: Secondary | ICD-10-CM | POA: Diagnosis not present

## 2020-07-18 DIAGNOSIS — I130 Hypertensive heart and chronic kidney disease with heart failure and stage 1 through stage 4 chronic kidney disease, or unspecified chronic kidney disease: Secondary | ICD-10-CM | POA: Diagnosis not present

## 2020-07-18 DIAGNOSIS — D649 Anemia, unspecified: Secondary | ICD-10-CM | POA: Diagnosis not present

## 2020-07-19 DIAGNOSIS — M6289 Other specified disorders of muscle: Secondary | ICD-10-CM | POA: Diagnosis not present

## 2020-07-19 DIAGNOSIS — I10 Essential (primary) hypertension: Secondary | ICD-10-CM | POA: Diagnosis not present

## 2020-07-19 DIAGNOSIS — N189 Chronic kidney disease, unspecified: Secondary | ICD-10-CM | POA: Diagnosis not present

## 2020-07-19 DIAGNOSIS — D649 Anemia, unspecified: Secondary | ICD-10-CM | POA: Diagnosis not present

## 2020-07-24 ENCOUNTER — Institutional Professional Consult (permissible substitution): Payer: Medicare Other | Admitting: Neurology

## 2020-07-25 DIAGNOSIS — E119 Type 2 diabetes mellitus without complications: Secondary | ICD-10-CM | POA: Diagnosis not present

## 2020-07-26 DIAGNOSIS — E119 Type 2 diabetes mellitus without complications: Secondary | ICD-10-CM | POA: Diagnosis not present

## 2020-07-26 DIAGNOSIS — E785 Hyperlipidemia, unspecified: Secondary | ICD-10-CM | POA: Diagnosis not present

## 2020-07-26 DIAGNOSIS — R319 Hematuria, unspecified: Secondary | ICD-10-CM | POA: Diagnosis not present

## 2020-07-26 DIAGNOSIS — D649 Anemia, unspecified: Secondary | ICD-10-CM | POA: Diagnosis not present

## 2020-07-27 DIAGNOSIS — N390 Urinary tract infection, site not specified: Secondary | ICD-10-CM | POA: Diagnosis not present

## 2020-07-27 DIAGNOSIS — R319 Hematuria, unspecified: Secondary | ICD-10-CM | POA: Diagnosis not present

## 2020-08-04 DIAGNOSIS — I10 Essential (primary) hypertension: Secondary | ICD-10-CM | POA: Diagnosis not present

## 2020-08-07 DIAGNOSIS — E119 Type 2 diabetes mellitus without complications: Secondary | ICD-10-CM | POA: Diagnosis not present

## 2020-08-08 DIAGNOSIS — D649 Anemia, unspecified: Secondary | ICD-10-CM | POA: Diagnosis not present

## 2020-08-08 DIAGNOSIS — I10 Essential (primary) hypertension: Secondary | ICD-10-CM | POA: Diagnosis not present

## 2020-08-09 DIAGNOSIS — N189 Chronic kidney disease, unspecified: Secondary | ICD-10-CM | POA: Diagnosis not present

## 2020-08-09 DIAGNOSIS — N390 Urinary tract infection, site not specified: Secondary | ICD-10-CM | POA: Diagnosis not present

## 2020-08-09 DIAGNOSIS — H109 Unspecified conjunctivitis: Secondary | ICD-10-CM | POA: Diagnosis not present

## 2020-08-09 DIAGNOSIS — M25561 Pain in right knee: Secondary | ICD-10-CM | POA: Diagnosis not present

## 2020-08-15 DIAGNOSIS — D649 Anemia, unspecified: Secondary | ICD-10-CM | POA: Diagnosis not present

## 2020-08-15 DIAGNOSIS — I130 Hypertensive heart and chronic kidney disease with heart failure and stage 1 through stage 4 chronic kidney disease, or unspecified chronic kidney disease: Secondary | ICD-10-CM | POA: Diagnosis not present

## 2020-08-16 DIAGNOSIS — M25562 Pain in left knee: Secondary | ICD-10-CM | POA: Diagnosis not present

## 2020-08-16 DIAGNOSIS — B372 Candidiasis of skin and nail: Secondary | ICD-10-CM | POA: Diagnosis not present

## 2020-08-16 DIAGNOSIS — M25561 Pain in right knee: Secondary | ICD-10-CM | POA: Diagnosis not present

## 2020-08-16 DIAGNOSIS — N189 Chronic kidney disease, unspecified: Secondary | ICD-10-CM | POA: Diagnosis not present

## 2020-08-17 DIAGNOSIS — B372 Candidiasis of skin and nail: Secondary | ICD-10-CM | POA: Diagnosis not present

## 2020-08-17 DIAGNOSIS — M25561 Pain in right knee: Secondary | ICD-10-CM | POA: Diagnosis not present

## 2020-08-17 DIAGNOSIS — M25562 Pain in left knee: Secondary | ICD-10-CM | POA: Diagnosis not present

## 2020-08-17 DIAGNOSIS — E785 Hyperlipidemia, unspecified: Secondary | ICD-10-CM | POA: Diagnosis not present

## 2020-08-21 DIAGNOSIS — I69354 Hemiplegia and hemiparesis following cerebral infarction affecting left non-dominant side: Secondary | ICD-10-CM | POA: Diagnosis not present

## 2020-08-21 DIAGNOSIS — F802 Mixed receptive-expressive language disorder: Secondary | ICD-10-CM | POA: Diagnosis not present

## 2020-08-22 DIAGNOSIS — D649 Anemia, unspecified: Secondary | ICD-10-CM | POA: Diagnosis not present

## 2020-08-22 DIAGNOSIS — F802 Mixed receptive-expressive language disorder: Secondary | ICD-10-CM | POA: Diagnosis not present

## 2020-08-22 DIAGNOSIS — I69354 Hemiplegia and hemiparesis following cerebral infarction affecting left non-dominant side: Secondary | ICD-10-CM | POA: Diagnosis not present

## 2020-08-23 DIAGNOSIS — I69354 Hemiplegia and hemiparesis following cerebral infarction affecting left non-dominant side: Secondary | ICD-10-CM | POA: Diagnosis not present

## 2020-08-23 DIAGNOSIS — F802 Mixed receptive-expressive language disorder: Secondary | ICD-10-CM | POA: Diagnosis not present

## 2020-08-30 DIAGNOSIS — I130 Hypertensive heart and chronic kidney disease with heart failure and stage 1 through stage 4 chronic kidney disease, or unspecified chronic kidney disease: Secondary | ICD-10-CM | POA: Diagnosis not present

## 2020-08-30 DIAGNOSIS — E1100 Type 2 diabetes mellitus with hyperosmolarity without nonketotic hyperglycemic-hyperosmolar coma (NKHHC): Secondary | ICD-10-CM | POA: Diagnosis not present

## 2020-09-04 DIAGNOSIS — F802 Mixed receptive-expressive language disorder: Secondary | ICD-10-CM | POA: Diagnosis not present

## 2020-09-04 DIAGNOSIS — I69354 Hemiplegia and hemiparesis following cerebral infarction affecting left non-dominant side: Secondary | ICD-10-CM | POA: Diagnosis not present

## 2020-09-05 DIAGNOSIS — F802 Mixed receptive-expressive language disorder: Secondary | ICD-10-CM | POA: Diagnosis not present

## 2020-09-05 DIAGNOSIS — I69354 Hemiplegia and hemiparesis following cerebral infarction affecting left non-dominant side: Secondary | ICD-10-CM | POA: Diagnosis not present

## 2020-09-06 DIAGNOSIS — F802 Mixed receptive-expressive language disorder: Secondary | ICD-10-CM | POA: Diagnosis not present

## 2020-09-06 DIAGNOSIS — I69354 Hemiplegia and hemiparesis following cerebral infarction affecting left non-dominant side: Secondary | ICD-10-CM | POA: Diagnosis not present

## 2020-09-08 DIAGNOSIS — E119 Type 2 diabetes mellitus without complications: Secondary | ICD-10-CM | POA: Diagnosis not present

## 2020-09-11 DIAGNOSIS — D649 Anemia, unspecified: Secondary | ICD-10-CM | POA: Diagnosis not present

## 2020-09-12 DIAGNOSIS — D649 Anemia, unspecified: Secondary | ICD-10-CM | POA: Diagnosis not present

## 2020-09-13 DIAGNOSIS — D649 Anemia, unspecified: Secondary | ICD-10-CM | POA: Diagnosis not present

## 2020-09-13 DIAGNOSIS — N189 Chronic kidney disease, unspecified: Secondary | ICD-10-CM | POA: Diagnosis not present

## 2020-09-13 DIAGNOSIS — I69354 Hemiplegia and hemiparesis following cerebral infarction affecting left non-dominant side: Secondary | ICD-10-CM | POA: Diagnosis not present

## 2020-09-13 DIAGNOSIS — F802 Mixed receptive-expressive language disorder: Secondary | ICD-10-CM | POA: Diagnosis not present

## 2020-09-13 DIAGNOSIS — R6889 Other general symptoms and signs: Secondary | ICD-10-CM | POA: Diagnosis not present

## 2020-09-13 DIAGNOSIS — M6289 Other specified disorders of muscle: Secondary | ICD-10-CM | POA: Diagnosis not present

## 2020-09-14 DIAGNOSIS — F802 Mixed receptive-expressive language disorder: Secondary | ICD-10-CM | POA: Diagnosis not present

## 2020-09-14 DIAGNOSIS — I69354 Hemiplegia and hemiparesis following cerebral infarction affecting left non-dominant side: Secondary | ICD-10-CM | POA: Diagnosis not present

## 2020-09-15 DIAGNOSIS — I69354 Hemiplegia and hemiparesis following cerebral infarction affecting left non-dominant side: Secondary | ICD-10-CM | POA: Diagnosis not present

## 2020-09-15 DIAGNOSIS — F802 Mixed receptive-expressive language disorder: Secondary | ICD-10-CM | POA: Diagnosis not present

## 2020-09-15 DIAGNOSIS — D649 Anemia, unspecified: Secondary | ICD-10-CM | POA: Diagnosis not present

## 2020-09-18 DIAGNOSIS — F802 Mixed receptive-expressive language disorder: Secondary | ICD-10-CM | POA: Diagnosis not present

## 2020-09-18 DIAGNOSIS — E785 Hyperlipidemia, unspecified: Secondary | ICD-10-CM | POA: Diagnosis not present

## 2020-09-18 DIAGNOSIS — I69354 Hemiplegia and hemiparesis following cerebral infarction affecting left non-dominant side: Secondary | ICD-10-CM | POA: Diagnosis not present

## 2020-09-18 DIAGNOSIS — R0602 Shortness of breath: Secondary | ICD-10-CM | POA: Diagnosis not present

## 2020-09-18 DIAGNOSIS — E119 Type 2 diabetes mellitus without complications: Secondary | ICD-10-CM | POA: Diagnosis not present

## 2020-09-18 DIAGNOSIS — D649 Anemia, unspecified: Secondary | ICD-10-CM | POA: Diagnosis not present

## 2020-09-19 DIAGNOSIS — I10 Essential (primary) hypertension: Secondary | ICD-10-CM | POA: Diagnosis not present

## 2020-09-19 DIAGNOSIS — I69354 Hemiplegia and hemiparesis following cerebral infarction affecting left non-dominant side: Secondary | ICD-10-CM | POA: Diagnosis not present

## 2020-09-19 DIAGNOSIS — F802 Mixed receptive-expressive language disorder: Secondary | ICD-10-CM | POA: Diagnosis not present

## 2020-09-20 DIAGNOSIS — F802 Mixed receptive-expressive language disorder: Secondary | ICD-10-CM | POA: Diagnosis not present

## 2020-09-20 DIAGNOSIS — I69354 Hemiplegia and hemiparesis following cerebral infarction affecting left non-dominant side: Secondary | ICD-10-CM | POA: Diagnosis not present

## 2020-09-21 DIAGNOSIS — D519 Vitamin B12 deficiency anemia, unspecified: Secondary | ICD-10-CM | POA: Diagnosis not present

## 2020-09-21 DIAGNOSIS — D649 Anemia, unspecified: Secondary | ICD-10-CM | POA: Diagnosis not present

## 2020-09-22 DIAGNOSIS — F802 Mixed receptive-expressive language disorder: Secondary | ICD-10-CM | POA: Diagnosis not present

## 2020-09-22 DIAGNOSIS — I69354 Hemiplegia and hemiparesis following cerebral infarction affecting left non-dominant side: Secondary | ICD-10-CM | POA: Diagnosis not present

## 2020-09-25 DIAGNOSIS — F802 Mixed receptive-expressive language disorder: Secondary | ICD-10-CM | POA: Diagnosis not present

## 2020-09-25 DIAGNOSIS — I69354 Hemiplegia and hemiparesis following cerebral infarction affecting left non-dominant side: Secondary | ICD-10-CM | POA: Diagnosis not present

## 2020-09-26 DIAGNOSIS — I69354 Hemiplegia and hemiparesis following cerebral infarction affecting left non-dominant side: Secondary | ICD-10-CM | POA: Diagnosis not present

## 2020-09-26 DIAGNOSIS — I10 Essential (primary) hypertension: Secondary | ICD-10-CM | POA: Diagnosis not present

## 2020-09-26 DIAGNOSIS — F802 Mixed receptive-expressive language disorder: Secondary | ICD-10-CM | POA: Diagnosis not present

## 2020-09-27 DIAGNOSIS — F802 Mixed receptive-expressive language disorder: Secondary | ICD-10-CM | POA: Diagnosis not present

## 2020-09-27 DIAGNOSIS — I69354 Hemiplegia and hemiparesis following cerebral infarction affecting left non-dominant side: Secondary | ICD-10-CM | POA: Diagnosis not present

## 2020-09-28 DIAGNOSIS — D649 Anemia, unspecified: Secondary | ICD-10-CM | POA: Diagnosis not present

## 2020-09-28 DIAGNOSIS — R0602 Shortness of breath: Secondary | ICD-10-CM | POA: Diagnosis not present

## 2020-09-28 DIAGNOSIS — R6889 Other general symptoms and signs: Secondary | ICD-10-CM | POA: Diagnosis not present

## 2020-09-28 DIAGNOSIS — I69354 Hemiplegia and hemiparesis following cerebral infarction affecting left non-dominant side: Secondary | ICD-10-CM | POA: Diagnosis not present

## 2020-09-28 DIAGNOSIS — M6289 Other specified disorders of muscle: Secondary | ICD-10-CM | POA: Diagnosis not present

## 2020-09-28 DIAGNOSIS — F802 Mixed receptive-expressive language disorder: Secondary | ICD-10-CM | POA: Diagnosis not present

## 2020-09-29 ENCOUNTER — Inpatient Hospital Stay (HOSPITAL_COMMUNITY)
Admission: EM | Admit: 2020-09-29 | Discharge: 2020-10-04 | DRG: 683 | Disposition: A | Discharge status: Skilled Nursing Facility | Payer: Medicare Other | Source: Skilled Nursing Facility | Attending: Internal Medicine | Admitting: Internal Medicine

## 2020-09-29 ENCOUNTER — Encounter (HOSPITAL_COMMUNITY): Payer: Self-pay

## 2020-09-29 ENCOUNTER — Other Ambulatory Visit: Payer: Self-pay

## 2020-09-29 ENCOUNTER — Emergency Department (HOSPITAL_COMMUNITY): Payer: Medicare Other

## 2020-09-29 DIAGNOSIS — E039 Hypothyroidism, unspecified: Secondary | ICD-10-CM | POA: Diagnosis present

## 2020-09-29 DIAGNOSIS — Z931 Gastrostomy status: Secondary | ICD-10-CM

## 2020-09-29 DIAGNOSIS — B952 Enterococcus as the cause of diseases classified elsewhere: Secondary | ICD-10-CM | POA: Diagnosis present

## 2020-09-29 DIAGNOSIS — Y846 Urinary catheterization as the cause of abnormal reaction of the patient, or of later complication, without mention of misadventure at the time of the procedure: Secondary | ICD-10-CM | POA: Diagnosis present

## 2020-09-29 DIAGNOSIS — Z833 Family history of diabetes mellitus: Secondary | ICD-10-CM

## 2020-09-29 DIAGNOSIS — K5900 Constipation, unspecified: Secondary | ICD-10-CM | POA: Diagnosis not present

## 2020-09-29 DIAGNOSIS — J9811 Atelectasis: Secondary | ICD-10-CM | POA: Diagnosis not present

## 2020-09-29 DIAGNOSIS — I10 Essential (primary) hypertension: Secondary | ICD-10-CM | POA: Diagnosis not present

## 2020-09-29 DIAGNOSIS — Z8249 Family history of ischemic heart disease and other diseases of the circulatory system: Secondary | ICD-10-CM

## 2020-09-29 DIAGNOSIS — Z20822 Contact with and (suspected) exposure to covid-19: Secondary | ICD-10-CM | POA: Diagnosis present

## 2020-09-29 DIAGNOSIS — N179 Acute kidney failure, unspecified: Principal | ICD-10-CM | POA: Diagnosis present

## 2020-09-29 DIAGNOSIS — N390 Urinary tract infection, site not specified: Secondary | ICD-10-CM | POA: Diagnosis not present

## 2020-09-29 DIAGNOSIS — R0602 Shortness of breath: Secondary | ICD-10-CM | POA: Diagnosis not present

## 2020-09-29 DIAGNOSIS — D649 Anemia, unspecified: Secondary | ICD-10-CM | POA: Diagnosis not present

## 2020-09-29 DIAGNOSIS — R0902 Hypoxemia: Secondary | ICD-10-CM

## 2020-09-29 DIAGNOSIS — J90 Pleural effusion, not elsewhere classified: Secondary | ICD-10-CM | POA: Diagnosis not present

## 2020-09-29 DIAGNOSIS — I69354 Hemiplegia and hemiparesis following cerebral infarction affecting left non-dominant side: Secondary | ICD-10-CM | POA: Diagnosis not present

## 2020-09-29 DIAGNOSIS — R5381 Other malaise: Secondary | ICD-10-CM | POA: Diagnosis present

## 2020-09-29 DIAGNOSIS — L89150 Pressure ulcer of sacral region, unstageable: Secondary | ICD-10-CM | POA: Diagnosis not present

## 2020-09-29 DIAGNOSIS — R404 Transient alteration of awareness: Secondary | ICD-10-CM | POA: Diagnosis not present

## 2020-09-29 DIAGNOSIS — E785 Hyperlipidemia, unspecified: Secondary | ICD-10-CM | POA: Diagnosis not present

## 2020-09-29 DIAGNOSIS — Z86711 Personal history of pulmonary embolism: Secondary | ICD-10-CM

## 2020-09-29 DIAGNOSIS — Q600 Renal agenesis, unilateral: Secondary | ICD-10-CM | POA: Diagnosis not present

## 2020-09-29 DIAGNOSIS — Z79899 Other long term (current) drug therapy: Secondary | ICD-10-CM

## 2020-09-29 DIAGNOSIS — I700 Atherosclerosis of aorta: Secondary | ICD-10-CM | POA: Diagnosis not present

## 2020-09-29 DIAGNOSIS — Z66 Do not resuscitate: Secondary | ICD-10-CM | POA: Diagnosis present

## 2020-09-29 DIAGNOSIS — J984 Other disorders of lung: Secondary | ICD-10-CM | POA: Diagnosis not present

## 2020-09-29 DIAGNOSIS — Z515 Encounter for palliative care: Secondary | ICD-10-CM | POA: Diagnosis not present

## 2020-09-29 DIAGNOSIS — Z9981 Dependence on supplemental oxygen: Secondary | ICD-10-CM

## 2020-09-29 DIAGNOSIS — I639 Cerebral infarction, unspecified: Secondary | ICD-10-CM | POA: Diagnosis present

## 2020-09-29 DIAGNOSIS — T83511A Infection and inflammatory reaction due to indwelling urethral catheter, initial encounter: Secondary | ICD-10-CM | POA: Diagnosis not present

## 2020-09-29 DIAGNOSIS — E119 Type 2 diabetes mellitus without complications: Secondary | ICD-10-CM

## 2020-09-29 DIAGNOSIS — D509 Iron deficiency anemia, unspecified: Secondary | ICD-10-CM | POA: Diagnosis present

## 2020-09-29 DIAGNOSIS — B9620 Unspecified Escherichia coli [E. coli] as the cause of diseases classified elsewhere: Secondary | ICD-10-CM | POA: Diagnosis present

## 2020-09-29 DIAGNOSIS — I69398 Other sequelae of cerebral infarction: Secondary | ICD-10-CM | POA: Diagnosis not present

## 2020-09-29 DIAGNOSIS — Z79890 Hormone replacement therapy (postmenopausal): Secondary | ICD-10-CM

## 2020-09-29 DIAGNOSIS — R0689 Other abnormalities of breathing: Secondary | ICD-10-CM | POA: Diagnosis not present

## 2020-09-29 DIAGNOSIS — Z888 Allergy status to other drugs, medicaments and biological substances status: Secondary | ICD-10-CM

## 2020-09-29 DIAGNOSIS — Z7902 Long term (current) use of antithrombotics/antiplatelets: Secondary | ICD-10-CM | POA: Diagnosis not present

## 2020-09-29 DIAGNOSIS — Z96653 Presence of artificial knee joint, bilateral: Secondary | ICD-10-CM | POA: Diagnosis present

## 2020-09-29 DIAGNOSIS — R54 Age-related physical debility: Secondary | ICD-10-CM | POA: Diagnosis present

## 2020-09-29 DIAGNOSIS — Z794 Long term (current) use of insulin: Secondary | ICD-10-CM

## 2020-09-29 DIAGNOSIS — G894 Chronic pain syndrome: Secondary | ICD-10-CM | POA: Diagnosis present

## 2020-09-29 DIAGNOSIS — Z96611 Presence of right artificial shoulder joint: Secondary | ICD-10-CM | POA: Diagnosis present

## 2020-09-29 DIAGNOSIS — Z882 Allergy status to sulfonamides status: Secondary | ICD-10-CM

## 2020-09-29 DIAGNOSIS — R739 Hyperglycemia, unspecified: Secondary | ICD-10-CM | POA: Diagnosis not present

## 2020-09-29 DIAGNOSIS — J9611 Chronic respiratory failure with hypoxia: Secondary | ICD-10-CM | POA: Diagnosis not present

## 2020-09-29 DIAGNOSIS — Z9049 Acquired absence of other specified parts of digestive tract: Secondary | ICD-10-CM | POA: Diagnosis not present

## 2020-09-29 DIAGNOSIS — Z09 Encounter for follow-up examination after completed treatment for conditions other than malignant neoplasm: Secondary | ICD-10-CM

## 2020-09-29 DIAGNOSIS — Z96612 Presence of left artificial shoulder joint: Secondary | ICD-10-CM | POA: Diagnosis present

## 2020-09-29 DIAGNOSIS — R062 Wheezing: Secondary | ICD-10-CM | POA: Diagnosis not present

## 2020-09-29 LAB — VITAMIN B12: Vitamin B-12: 406 pg/mL (ref 180–914)

## 2020-09-29 LAB — TYPE AND SCREEN
ABO/RH(D): A POS
Antibody Screen: NEGATIVE

## 2020-09-29 LAB — URINALYSIS, ROUTINE W REFLEX MICROSCOPIC
Bilirubin Urine: NEGATIVE
Glucose, UA: >=500 mg/dL — AB
Ketones, ur: NEGATIVE mg/dL
Nitrite: NEGATIVE
Protein, ur: NEGATIVE mg/dL
Specific Gravity, Urine: 1.009 (ref 1.005–1.030)
WBC, UA: >50 WBC/hpf — ABNORMAL HIGH (ref 0–5)
pH: 7 (ref 5.0–8.0)

## 2020-09-29 LAB — CBC WITH DIFFERENTIAL/PLATELET
Abs Immature Granulocytes: 0.06 10*3/uL (ref 0.00–0.07)
Basophils Absolute: 0 10*3/uL (ref 0.0–0.1)
Basophils Relative: 1 %
Eosinophils Absolute: 0.1 10*3/uL (ref 0.0–0.5)
Eosinophils Relative: 1 %
HCT: 28.9 % — ABNORMAL LOW (ref 36.0–46.0)
Hemoglobin: 8.5 g/dL — ABNORMAL LOW (ref 12.0–15.0)
Immature Granulocytes: 1 %
Lymphocytes Relative: 17 %
Lymphs Abs: 1.5 10*3/uL (ref 0.7–4.0)
MCH: 29.2 pg (ref 26.0–34.0)
MCHC: 29.4 g/dL — ABNORMAL LOW (ref 30.0–36.0)
MCV: 99.3 fL (ref 80.0–100.0)
Monocytes Absolute: 1.2 10*3/uL — ABNORMAL HIGH (ref 0.1–1.0)
Monocytes Relative: 13 %
Neutro Abs: 6 10*3/uL (ref 1.7–7.7)
Neutrophils Relative %: 67 %
Platelets: 320 10*3/uL (ref 150–400)
RBC: 2.91 MIL/uL — ABNORMAL LOW (ref 3.87–5.11)
RDW: 14.4 % (ref 11.5–15.5)
WBC: 8.9 10*3/uL (ref 4.0–10.5)
nRBC: 0 % (ref 0.0–0.2)

## 2020-09-29 LAB — RESP PANEL BY RT-PCR (FLU A&B, COVID) ARPGX2
Influenza A by PCR: NEGATIVE
Influenza B by PCR: NEGATIVE
SARS Coronavirus 2 by RT PCR: NEGATIVE

## 2020-09-29 LAB — COMPREHENSIVE METABOLIC PANEL
ALT: 21 U/L (ref 0–44)
AST: 26 U/L (ref 15–41)
Albumin: 2.3 g/dL — ABNORMAL LOW (ref 3.5–5.0)
Alkaline Phosphatase: 95 U/L (ref 38–126)
Anion gap: 11 (ref 5–15)
BUN: 80 mg/dL — ABNORMAL HIGH (ref 8–23)
CO2: 29 mmol/L (ref 22–32)
Calcium: 9 mg/dL (ref 8.9–10.3)
Chloride: 93 mmol/L — ABNORMAL LOW (ref 98–111)
Creatinine, Ser: 1.87 mg/dL — ABNORMAL HIGH (ref 0.44–1.00)
GFR, Estimated: 26 mL/min — ABNORMAL LOW (ref >60)
Glucose, Bld: 218 mg/dL — ABNORMAL HIGH (ref 70–99)
Potassium: 4.8 mmol/L (ref 3.5–5.1)
Sodium: 133 mmol/L — ABNORMAL LOW (ref 135–145)
Total Bilirubin: 0.7 mg/dL (ref 0.3–1.2)
Total Protein: 6.2 g/dL — ABNORMAL LOW (ref 6.5–8.1)

## 2020-09-29 LAB — FERRITIN: Ferritin: 466 ng/mL — ABNORMAL HIGH (ref 11–307)

## 2020-09-29 LAB — RETICULOCYTES
Immature Retic Fract: 15.2 % (ref 2.3–15.9)
RBC.: 2.97 MIL/uL — ABNORMAL LOW (ref 3.87–5.11)
Retic Count, Absolute: 53.2 10*3/uL (ref 19.0–186.0)
Retic Ct Pct: 1.8 % (ref 0.4–3.1)

## 2020-09-29 LAB — IRON AND TIBC
Iron: 9 ug/dL — ABNORMAL LOW (ref 28–170)
Saturation Ratios: 4 % — ABNORMAL LOW (ref 10.4–31.8)
TIBC: 204 ug/dL — ABNORMAL LOW (ref 250–450)
UIBC: 195 ug/dL

## 2020-09-29 LAB — CBG MONITORING, ED: Glucose-Capillary: 361 mg/dL — ABNORMAL HIGH (ref 70–99)

## 2020-09-29 LAB — GLUCOSE, CAPILLARY: Glucose-Capillary: 346 mg/dL — ABNORMAL HIGH (ref 70–99)

## 2020-09-29 LAB — FOLATE: Folate: 34 ng/mL (ref >5.9)

## 2020-09-29 LAB — POC OCCULT BLOOD, ED: Fecal Occult Bld: NEGATIVE

## 2020-09-29 MED ORDER — BISACODYL 10 MG RE SUPP: 10.0000 mg | Freq: Every day | RECTAL | Status: DC | PRN

## 2020-09-29 MED ORDER — HYDRALAZINE HCL 10 MG PO TABS
10.0000 mg | ORAL_TABLET | Freq: Three times a day (TID) | ORAL | Status: DC
  Administered 2020-09-29 – 2020-10-03 (×14): 10 mg
  Filled 2020-09-29 (×13): qty 1

## 2020-09-29 MED ORDER — ENOXAPARIN SODIUM 30 MG/0.3ML IJ SOSY
30.0000 mg | PREFILLED_SYRINGE | INTRAMUSCULAR | Status: DC
  Administered 2020-09-29 – 2020-10-03 (×5): 30 mg via SUBCUTANEOUS
  Filled 2020-09-29 (×5): qty 0.3

## 2020-09-29 MED ORDER — ACETAMINOPHEN 325 MG PO TABS
650.0000 mg | ORAL_TABLET | Freq: Four times a day (QID) | ORAL | Status: DC
  Administered 2020-09-29 – 2020-10-03 (×18): 650 mg via NASOGASTRIC
  Filled 2020-09-29 (×18): qty 2

## 2020-09-29 MED ORDER — CLOPIDOGREL BISULFATE 75 MG PO TABS
75.0000 mg | ORAL_TABLET | Freq: Every day | ORAL | Status: DC
  Administered 2020-09-29 – 2020-10-03 (×5): 75 mg
  Filled 2020-09-29 (×5): qty 1

## 2020-09-29 MED ORDER — DAKINS (1/4 STRENGTH) 0.125 % EX SOLN
Freq: Two times a day (BID) | CUTANEOUS | Status: AC
  Administered 2020-09-29: 1
  Filled 2020-09-29: qty 473

## 2020-09-29 MED ORDER — PROSOURCE NO CARB PO LIQD
30.0000 mL | Freq: Three times a day (TID) | ORAL | Status: DC
  Administered 2020-09-30: 30 mL via ORAL
  Filled 2020-09-29 (×2): qty 30

## 2020-09-29 MED ORDER — SODIUM CHLORIDE 0.9 % IV BOLUS
1000.0000 mL | Freq: Once | INTRAVENOUS | Status: AC
  Administered 2020-09-29: 1000 mL via INTRAVENOUS

## 2020-09-29 MED ORDER — POLYETHYLENE GLYCOL 3350 17 G PO PACK
17.0000 g | PACK | Freq: Every day | ORAL | Status: DC | PRN
  Administered 2020-10-02 – 2020-10-03 (×2): 17 g
  Filled 2020-09-29 (×2): qty 1

## 2020-09-29 MED ORDER — ONDANSETRON HCL 4 MG/2ML IJ SOLN: 4.0000 mg | Freq: Four times a day (QID) | INTRAMUSCULAR | Status: DC | PRN

## 2020-09-29 MED ORDER — HYPROMELLOSE (GONIOSCOPIC) 2.5 % OP SOLN: 1.0000 [drp] | Freq: Four times a day (QID) | OPHTHALMIC | Status: DC | PRN

## 2020-09-29 MED ORDER — LEVOTHYROXINE SODIUM 112 MCG PO TABS
112.0000 ug | ORAL_TABLET | Freq: Every day | ORAL | Status: DC
  Administered 2020-09-30 – 2020-10-03 (×4): 112 ug
  Filled 2020-09-29 (×4): qty 1

## 2020-09-29 MED ORDER — GLUCERNA 1.2 CAL PO LIQD
1000.0000 mL | ORAL | Status: DC
  Administered 2020-09-29: 1000 mL
  Filled 2020-09-29: qty 1000

## 2020-09-29 MED ORDER — METHOCARBAMOL 750 MG PO TABS
750.0000 mg | ORAL_TABLET | Freq: Four times a day (QID) | ORAL | Status: DC
  Administered 2020-09-29 – 2020-10-03 (×18): 750 mg
  Filled 2020-09-29 (×12): qty 1
  Filled 2020-09-29: qty 2
  Filled 2020-09-29 (×5): qty 1

## 2020-09-29 MED ORDER — FERROUS SULFATE 325 (65 FE) MG PO TABS
325.0000 mg | ORAL_TABLET | Freq: Every day | ORAL | Status: DC
  Administered 2020-09-30 – 2020-10-03 (×4): 325 mg via ORAL
  Filled 2020-09-29 (×4): qty 1

## 2020-09-29 MED ORDER — HYDRALAZINE HCL 10 MG PO TABS
10.0000 mg | ORAL_TABLET | Freq: Three times a day (TID) | ORAL | Status: DC
  Filled 2020-09-29: qty 1

## 2020-09-29 MED ORDER — MODAFINIL 100 MG PO TABS
200.0000 mg | ORAL_TABLET | Freq: Every day | ORAL | Status: DC
  Administered 2020-09-29 – 2020-10-03 (×5): 200 mg
  Filled 2020-09-29 (×5): qty 2

## 2020-09-29 MED ORDER — DOCUSATE SODIUM 50 MG/5ML PO LIQD
100.0000 mg | Freq: Two times a day (BID) | ORAL | Status: DC
  Administered 2020-09-29 – 2020-10-03 (×8): 100 mg
  Filled 2020-09-29 (×11): qty 10

## 2020-09-29 MED ORDER — OXYCODONE HCL 5 MG PO TABS
5.0000 mg | ORAL_TABLET | Freq: Four times a day (QID) | ORAL | Status: DC
Start: 2020-09-29 — End: 2020-10-04
  Administered 2020-09-29 – 2020-10-03 (×17): 5 mg
  Filled 2020-09-29 (×17): qty 1

## 2020-09-29 MED ORDER — PROSOURCE TF PO LIQD
45.0000 mL | Freq: Three times a day (TID) | ORAL | Status: DC
  Administered 2020-09-29 – 2020-09-30 (×2): 45 mL
  Filled 2020-09-29 (×3): qty 45

## 2020-09-29 MED ORDER — LACTATED RINGERS IV SOLN: INTRAVENOUS | Status: DC

## 2020-09-29 MED ORDER — ROSUVASTATIN CALCIUM 5 MG PO TABS
2.5000 mg | ORAL_TABLET | Freq: Every day | ORAL | Status: DC
  Administered 2020-09-30 – 2020-10-03 (×4): 2.5 mg
  Filled 2020-09-29 (×4): qty 1

## 2020-09-29 MED ORDER — HYDRALAZINE HCL 20 MG/ML IJ SOLN: 5.0000 mg | INTRAMUSCULAR | Status: DC | PRN

## 2020-09-29 MED ORDER — GLUCERNA 1.2 CAL PO LIQD
1000.0000 mL | ORAL | Status: DC
  Filled 2020-09-29: qty 1000

## 2020-09-29 MED ORDER — INSULIN DETEMIR 100 UNIT/ML ~~LOC~~ SOLN
11.0000 [IU] | Freq: Two times a day (BID) | SUBCUTANEOUS | Status: DC
  Administered 2020-09-29 – 2020-10-01 (×4): 11 [IU] via SUBCUTANEOUS
  Filled 2020-09-29 (×6): qty 0.11

## 2020-09-29 MED ORDER — ONDANSETRON HCL 4 MG PO TABS: 4.0000 mg | ORAL_TABLET | Freq: Four times a day (QID) | ORAL | Status: DC | PRN

## 2020-09-29 MED ORDER — CHLORHEXIDINE GLUCONATE CLOTH 2 % EX PADS
6.0000 | MEDICATED_PAD | Freq: Every day | CUTANEOUS | Status: DC
  Administered 2020-09-29 – 2020-10-03 (×5): 6 via TOPICAL

## 2020-09-29 MED ORDER — AMLODIPINE BESYLATE 10 MG PO TABS
10.0000 mg | ORAL_TABLET | Freq: Every day | ORAL | Status: DC
  Administered 2020-09-29 – 2020-10-03 (×5): 10 mg
  Filled 2020-09-29 (×3): qty 1
  Filled 2020-09-29: qty 2
  Filled 2020-09-29: qty 1

## 2020-09-29 NOTE — ED Provider Notes (Signed)
[facility] EMERGENCY DEPARTMENT Provider Note   CSN: [id number] Arrival date & time: 09/29/20  3710     History Chief Complaint  Patient presents with   Shortness of Breath    Hannah Fischer is a 87 y.o. female.  Apparently patient had coughing and wheezing and decreased oxygen at SNF. Ems called. Sats stable. Albuterol/ipratropium/solumedrol given with EMS with apparent improvement?  The history is provided by the EMS personnel.  Shortness of Breath     Past Medical History:  Diagnosis Date   Abnormal nuclear stress test    2005, normal cath Dr. Elease Hashimoto, normal stress test 2012 Dr. Eldridge Dace   Anemia    chronic   Anxiety    Arthritis    djd   Arthritis of shoulder region, left, degenerative 12/03/2013   Blood transfusion 1979   for anemia   Chest pain    evaluated @ [facility] cardiology; had a stress test 2012   Chronic pain syndrome 04/29/2019   Chronic right-sided low back pain without sciatica 04/12/2019   Complication of anesthesia    unable to breathe lying  flat on back   Diabetes mellitus    type 2  niddm x 25 yrs   Diabetes mellitus (HCC) 11/15/2011   Diverticular disease    DJD (degenerative joint disease)    lumbar and cervical   GERD (gastroesophageal reflux disease)    Hyperlipemia    Hypertension    on medication   Hypothyroidism    takes levoxyl   Irritable bowel syndrome    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   Neuromuscular disorder (HCC)    Neuropathy in diabetes (HCC)    Nonspecific abnormal electrocardiogram (ECG) (EKG) 10/15/2013   Other and unspecified angina pectoris    tests came back NOT heart related   Pulmonary embolism (HCC) 06-2010   bilateral   Pyuria 11/15/2011   Seizures (HCC)    once, age 80   TIA (transient ischemic attack) 11/15/2011    Patient Active Problem List   Diagnosis Date Noted   Chronic right Ureterovesical junction (UVJ) obstruction 05/01/2020   Acute lower UTI 04/20/2020    Palliative care by specialist    Goals of care, counseling/discussion    DNR (do not resuscitate)    Acute bilateral thalamic infarction (HCC) 04/12/2020   Dysphagia 04/12/2020   CKD (chronic kidney disease), symptom management only, stage 3 (moderate) (HCC) 04/12/2020   Hydronephrosis 04/12/2020   Group B streptococcal UTI 04/12/2020   Uncontrolled type 2 diabetes mellitus with hyperglycemia, with long-term current use of insulin (HCC) 04/12/2020   Chronic diastolic heart failure (HCC) 04/12/2020   Essential hypertension 04/12/2020   Physical debility 04/12/2020   Left hemiplegia (HCC) 04/12/2020   History of embolic stroke 03/22/2020   Acute encephalopathy 03/18/2020   Gait abnormality 01/19/2020   Chest pressure 01/19/2020   Cerebral amyloid angiopathy (CODE)    Stroke (HCC) 11/24/2019   AKI (acute kidney injury) (HCC) 11/24/2019   Spinal stenosis of lumbar region with neurogenic claudication 10/26/2019   Abnormality of gait 08/02/2019   Cerebrovascular accident (CVA) (HCC) 07/22/2019   Dizziness 06/15/2019   Brain aneurysm 06/15/2019   Chronic pain syndrome 04/29/2019   Chronic right-sided low back pain without sciatica 04/12/2019   Arthritis of shoulder region, left, degenerative 12/03/2013   Nonspecific abnormal electrocardiogram (ECG) (EKG) 10/15/2013   Other and unspecified angina pectoris 10/15/2013   TIA (transient ischemic attack) 11/15/2011   HTN (hypertension) 11/15/2011  Diabetes mellitus (HCC) 11/15/2011   Hypothyroidism 11/15/2011   Dyslipidemia 11/15/2011   GERD (gastroesophageal reflux disease) 11/15/2011   Pyuria 11/15/2011    Past Surgical History:  Procedure Laterality Date   BACK SURGERY     BLEPHAROPLASTY  2012   rt eye   CARDIAC CATHETERIZATION     had one 10/2013   CHOLECYSTECTOMY     EYE SURGERY  2007   cataract ext/ iol rt eye   IR GASTROSTOMY TUBE MOD SED  04/17/2020   IR NEPHROSTOGRAM RIGHT THRU EXISTING ACCESS  04/30/2020   IR NEPHROSTOMY  PLACEMENT RIGHT  03/27/2020   IR US GUIDANCE  03/27/2020   IR US GUIDANCE  04/30/2020   JOINT REPLACEMENT  2001,2005   both knees   LEFT HEART CATHETERIZATION WITH CORONARY ANGIOGRAM N/A 10/18/2013   Procedure: LEFT HEART CATHETERIZATION WITH CORONARY ANGIOGRAM;  Surgeon: Corky Crafts, MD;  Location: [facility] CATH LAB;  Service: Cardiovascular;  Laterality: N/A;   LUMBAR DISC SURGERY  1990   REVERSE SHOULDER ARTHROPLASTY Left 12/03/2013   Procedure: LEFT SHOULDER REVERSE ARTHROPLASTY;  Surgeon: Verlee Rossetti, MD;  Location: [facility] OR;  Service: Orthopedics;  Laterality: Left;   SHOULDER HEMI-ARTHROPLASTY  12/25/2010   Procedure: SHOULDER HEMI-ARTHROPLASTY;  Surgeon: Cammy Copa;  Location: [facility] OR;  Service: Orthopedics;  Laterality: Right;     OB History   No obstetric history on file.     Family History  Problem Relation Age of Onset   Diabetes Mother    Heart disease Mother    Heart disease Sister    Heart disease Brother     Social History   Tobacco Use   Smoking status: Never   Smokeless tobacco: Never  Substance Use Topics   Alcohol use: No   Drug use: No    Home Medications Prior to Admission medications   Medication Sig Start Date End Date Taking? Authorizing Provider  acetaminophen (TYLENOL) 325 MG tablet 2 tablets (650 mg total) by Per NG tube route every 6 (six) hours. 05/01/20   Russella Dar, NP  amLODipine (NORVASC) 5 MG tablet Place 1 tablet (5 mg total) into feeding tube daily. 05/02/20   Russella Dar, NP  clopidogrel (PLAVIX) 75 MG tablet Place 1 tablet (75 mg total) into feeding tube daily. 05/02/20   Russella Dar, NP  docusate (COLACE) 50 MG/5ML liquid Place 10 mLs (100 mg total) into feeding tube 2 (two) times daily as needed for mild constipation. 05/01/20   Russella Dar, NP  hydroxypropyl methylcellulose / hypromellose (ISOPTO TEARS / GONIOVISC) 2.5 % ophthalmic solution Place 1 drop into both eyes 4 (four) times daily as needed for dry  eyes. 05/01/20   Russella Dar, NP  insulin aspart (NOVOLOG) 100 UNIT/ML injection Inject 0-9 Units into the skin every 4 (four) hours. Correction coverage: Sensitive (thin, NPO, renal)  CBG < 70: Implement Hypoglycemia Standing Orders and refer to Hypoglycemia Standing Orders sidebar report  CBG 70 - 120: 0 units  CBG 121 - 150: 1 unit  CBG 151 - 200: 2 units  CBG 201 - 250: 3 units  CBG 251 - 300: 5 units  CBG 301 - 350: 7 units  CBG 351 - 400 9 units  CBG > 400 call MD and obtain STAT lab verification 05/01/20   Russella Dar, NP  insulin aspart (NOVOLOG) 100 UNIT/ML injection Inject 2 Units into the skin every 4 (four) hours. 05/01/20   Russella Dar, NP  insulin detemir (LEVEMIR) 100 UNIT/ML injection Inject 0.05 mLs (5 Units total) into the skin daily. 05/02/20   Russella DarEllis, Allison L, NP  levothyroxine (SYNTHROID) 112 MCG tablet Place 1 tablet (112 mcg total) into feeding tube daily before breakfast. 05/02/20   Russella DarEllis, Allison L, NP  liver oil-zinc oxide (DESITIN) 40 % ointment Apply topically 2 (two) times daily. 05/01/20   Russella DarEllis, Allison L, NP  methocarbamol (ROBAXIN) 750 MG tablet Place 1 tablet (750 mg total) into feeding tube 4 (four) times daily. 05/01/20   Russella DarEllis, Allison L, NP  modafinil (PROVIGIL) 200 MG tablet Place 1 tablet (200 mg total) into feeding tube daily. 05/02/20   Russella DarEllis, Allison L, NP  Nutritional Supplements (FEEDING SUPPLEMENT, GLUCERNA 1.2 CAL,) LIQD Place 1,000 mLs into feeding tube continuous. 05/01/20   Russella DarEllis, Allison L, NP  Nutritional Supplements (FEEDING SUPPLEMENT, PROSOURCE TF,) liquid Place 45 mLs into feeding tube 3 (three) times daily. 05/01/20   Russella DarEllis, Allison L, NP  oxyCODONE (OXY IR/ROXICODONE) 5 MG immediate release tablet Place 0.5 tablets (2.5 mg total) into feeding tube every 8 (eight) hours. 05/01/20   Russella DarEllis, Allison L, NP  polyethylene glycol (MIRALAX / GLYCOLAX) 17 g packet Place 17 g into feeding tube daily as needed for moderate constipation. 05/01/20    Russella DarEllis, Allison L, NP  rosuvastatin (CRESTOR) 5 MG tablet Place 0.5 tablets (2.5 mg total) into feeding tube daily at 6 PM. 05/01/20   Russella DarEllis, Allison L, NP  Water For Irrigation, Sterile (FREE WATER) SOLN Place 200 mLs into feeding tube every 6 (six) hours. 05/01/20   Russella DarEllis, Allison L, NP    Allergies    Atorvastatin, Ezetimibe-simvastatin, Lovastatin, Metformin hcl, Pravastatin, Codeine, Simvastatin, Sulfa antibiotics, and Sulfites  Review of Systems   Review of Systems  Unable to perform ROS: Patient nonverbal  Respiratory:  Positive for shortness of breath.    Physical Exam Updated Vital Signs BP (!) 141/60 (BP Location: Right Arm)   Pulse 94   Temp 98.4 F (36.9 C) (Oral)   Resp 20   SpO2 92%   Physical Exam Vitals and nursing note reviewed.  Constitutional:      Appearance: She is well-developed.  HENT:     Head: Normocephalic and atraumatic.  Cardiovascular:     Rate and Rhythm: Normal rate and regular rhythm.  Pulmonary:     Effort: No tachypnea or respiratory distress.     Breath sounds: No stridor. Wheezing present. No decreased breath sounds.  Chest:     Chest wall: No mass or deformity.  Abdominal:     General: There is no distension.  Musculoskeletal:     Cervical back: Normal range of motion.     Right lower leg: No edema.     Left lower leg: No edema.  Skin:    General: Skin is warm and dry.  Neurological:     General: No focal deficit present.     Mental Status: She is alert.    ED Results / Procedures / Treatments   Labs (all labs ordered are listed, but only abnormal results are displayed) Labs Reviewed  CBC WITH DIFFERENTIAL/PLATELET - Abnormal; Notable for the following components:      Result Value   RBC 2.91 (*)    Hemoglobin 8.5 (*)    HCT 28.9 (*)    MCHC 29.4 (*)    Monocytes Absolute 1.2 (*)    All other components within normal limits  COMPREHENSIVE METABOLIC PANEL    EKG EKG Interpretation  Date/Time:  Friday September 29 2020  05:55:15 EDT Ventricular Rate:  97 PR Interval:    QRS Duration: 110 QT Interval:  365 QTC Calculation: 464 R Axis:   -9 Text Interpretation: Atrial flutter Anterior infarct, old Baseline wander in lead(s) V5 Confirmed by Marily Memos [phone]) on 09/29/2020 6:15:17 AM  Radiology No results found.  Procedures Procedures   Medications Ordered in ED Medications - No data to display  ED Course  I have reviewed the triage vital signs and the nursing notes.  Pertinent labs & imaging results that were available during my care of the patient were reviewed by me and considered in my medical decision making (see chart for details).    MDM Rules/Calculators/A&P                         Will workup and treat for wheezing. No hypoxia. Will plan for likely discharge unless significant abnormatlieis.   Found to have AKI and new anemia. Fluids started. Hemoccult negative, anemia panel added on. Hospitalist paged for admission.   Final Clinical Impression(s) / ED Diagnoses Final diagnoses:  AKI (acute kidney injury) (HCC)  Anemia, unspecified type    Rx / DC Orders ED Discharge Orders     None        Carriann Hesse, Barbara Cower, MD 10/01/20 [phone]

## 2020-09-29 NOTE — Progress Notes (Signed)
New admission to [facility] due to continuation of care. Unable tom assess cognition level. Contracture to BUE. 2.5L [place], [place], husband at bedside.

## 2020-09-29 NOTE — H&P (Signed)
History and Physical    Hannah Fischer WHQ:[id number] DOB: Jul 20, 1932 DOA: 09/29/2020  PCP: Sigmund Hazel, MD Consultants:  Anne Fu - cardiology Patient coming from: [facility]; NOK: Zachary George Harper, [phone]; [phone]  Chief Complaint: SOB  HPI: Hannah Fischer is a 87 y.o. 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  She was reported to have cough at her facility and was noted to have hypoxia despite [place] O2 this AM.  His husband reports that she has been doing fairly well, he thinks.  She has a bedsore on her backside.  "She talks to me a lot, some.  She answers me when I ask her questions.  And she has put words together for a sentence."  She has been on oxygen throughout.  She has been coughing periodically for quite a while.  Cough is productive of phlegm at times.  He does not think she has had fever.    ED Course: h/o CVA, chronically altered with chronic foley.  Hypoxia, on 2L chronically.  Has AKI, anemia.  May also have UTI.  Will exchange foley and send for UA/culture.  Heme negative, on Plavix, Hgb dropped from 11 to 8.  Unable to reach family.  Review of Systems: Unable to perform  Ambulatory Status:  Non-ambulatory  COVID Vaccine Status:  Unknown  Past Medical History:  Diagnosis Date   Anemia    chronic   Anxiety    Arthritis    djd   Arthritis of shoulder region, left, degenerative 12/03/2013   Chronic pain syndrome 04/29/2019   Chronic right-sided low back pain without sciatica 04/12/2019   Complication of anesthesia    unable to breathe lying  flat on back   Diabetes mellitus    type 2  niddm x 25 yrs   Diverticular disease    DJD (degenerative joint disease)    lumbar and cervical   GERD (gastroesophageal reflux disease)     Hyperlipemia    Hypertension    on medication   Hypothyroidism    takes levoxyl   Irritable bowel syndrome    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   Neuropathy in diabetes [facility])    Other and unspecified angina pectoris    tests came back NOT heart related   Pulmonary embolism (HCC) 06/2010   bilateral   TIA (transient ischemic attack) 11/15/2011    Past Surgical History:  Procedure Laterality Date   BACK SURGERY     BLEPHAROPLASTY  2012   rt eye   CARDIAC CATHETERIZATION     had one 10/2013   CHOLECYSTECTOMY     EYE SURGERY  2007   cataract ext/ iol rt eye   IR GASTROSTOMY TUBE MOD SED  04/17/2020   IR NEPHROSTOGRAM RIGHT THRU EXISTING ACCESS  04/30/2020   IR NEPHROSTOMY PLACEMENT RIGHT  03/27/2020   IR US GUIDANCE  03/27/2020   IR US GUIDANCE  04/30/2020   JOINT REPLACEMENT  2001,2005   both knees   LEFT HEART CATHETERIZATION WITH CORONARY ANGIOGRAM N/A 10/18/2013   Procedure: LEFT HEART CATHETERIZATION WITH CORONARY ANGIOGRAM;  Surgeon: Corky Crafts, MD;  Location: [facility] CATH LAB;  Service: Cardiovascular;  Laterality: N/A;   LUMBAR DISC SURGERY  1990   REVERSE SHOULDER ARTHROPLASTY Left 12/03/2013   Procedure: LEFT SHOULDER REVERSE ARTHROPLASTY;  Surgeon: Verlee RossettiSteven R Norris, MD;  [facility];  Service: Orthopedics;  Laterality: Left;   SHOULDER HEMI-ARTHROPLASTY  12/25/2010   Procedure: SHOULDER HEMI-ARTHROPLASTY;  Surgeon: Cammy CopaGregory Scott Dean;  [facility];  Service: Orthopedics;  Laterality: Right;    Social History   Socioeconomic History   Marital status: Married    Spouse name: Not on file   Number of children: Not on file   Years of education: Not on file   Highest education level: Not on file  Occupational History   Not on file  Tobacco Use   Smoking status: Never   Smokeless tobacco: Never  Substance and Sexual Activity   Alcohol use: No   Drug use: No   Sexual activity: Never    Birth control/protection: Post-menopausal  Other  Topics Concern   Not on file  Social History Narrative   Not on file   Social Determinants of Health   Financial Resource Strain: Not on file  Food Insecurity: Not on file  Transportation Needs: Not on file  Physical Activity: Not on file  Stress: Not on file  Social Connections: Not on file  Intimate Partner Violence: Not on file    Allergies  Allergen Reactions   Atorvastatin Other (See Comments)    "Aches"   Ezetimibe-Simvastatin Other (See Comments)    "Aches"   Lovastatin Other (See Comments)    Weakness   Metformin Hcl Diarrhea   Pravastatin Other (See Comments)    Weakness   Codeine Anxiety and Other (See Comments)    Keeps the patient awake   Simvastatin Nausea Only and Other (See Comments)    Muscle weakness and an stomach upset   Sulfa Antibiotics Other (See Comments)    Gastric intolerance and caused stomach upset   Sulfites Other (See Comments)    Abdominal pain    Family History  Problem Relation Age of Onset   Diabetes Mother    Heart disease Mother    Heart disease Sister    Heart disease Brother     Prior to Admission medications   Medication Sig Start Date End Date Taking? Authorizing Provider  acetaminophen (TYLENOL) 325 MG tablet 2 tablets (650 mg total) by Per NG tube route every 6 (six) hours. 05/01/20   Russella DarEllis, Allison L, NP  amLODipine (NORVASC) 5 MG tablet Place 1 tablet (5 mg total) into feeding tube daily. 05/02/20   Russella DarEllis, Allison L, NP  clopidogrel (PLAVIX) 75 MG tablet Place 1 tablet (75 mg total) into feeding tube daily. 05/02/20   Russella DarEllis, Allison L, NP  docusate (COLACE) 50 MG/5ML liquid Place 10 mLs (100 mg total) into feeding tube 2 (two) times daily as needed for mild constipation. 05/01/20   Russella DarEllis, Allison L, NP  hydroxypropyl methylcellulose / hypromellose (ISOPTO TEARS / GONIOVISC) 2.5 % ophthalmic solution Place 1 drop into both eyes 4 (four) times daily as needed for dry eyes. 05/01/20   Russella DarEllis, Allison L, NP  insulin aspart (NOVOLOG)  100 UNIT/ML injection Inject 0-9 Units into the skin every 4 (four) hours. Correction coverage: Sensitive (thin, NPO, renal)  CBG < 70: Implement Hypoglycemia Standing Orders and refer to Hypoglycemia Standing Orders sidebar report  CBG 70 - 120: 0 units  CBG 121 - 150: 1 unit  CBG 151 - 200: 2 units  CBG 201 - 250: 3 units  CBG 251 - 300: 5 units  CBG 301 - 350: 7 units  CBG 351 - 400 9 units  CBG > 400 call  MD and obtain STAT lab verification 05/01/20   Russella Dar, NP  insulin aspart (NOVOLOG) 100 UNIT/ML injection Inject 2 Units into the skin every 4 (four) hours. 05/01/20   Russella Dar, NP  insulin detemir (LEVEMIR) 100 UNIT/ML injection Inject 0.05 mLs (5 Units total) into the skin daily. 05/02/20   Russella Dar, NP  levothyroxine (SYNTHROID) 112 MCG tablet Place 1 tablet (112 mcg total) into feeding tube daily before breakfast. 05/02/20   Russella Dar, NP  liver oil-zinc oxide (DESITIN) 40 % ointment Apply topically 2 (two) times daily. 05/01/20   Russella Dar, NP  methocarbamol (ROBAXIN) 750 MG tablet Place 1 tablet (750 mg total) into feeding tube 4 (four) times daily. 05/01/20   Russella Dar, NP  modafinil (PROVIGIL) 200 MG tablet Place 1 tablet (200 mg total) into feeding tube daily. 05/02/20   Russella Dar, NP  Nutritional Supplements (FEEDING SUPPLEMENT, GLUCERNA 1.2 CAL,) LIQD Place 1,000 mLs into feeding tube continuous. 05/01/20   Russella Dar, NP  Nutritional Supplements (FEEDING SUPPLEMENT, PROSOURCE TF,) liquid Place 45 mLs into feeding tube 3 (three) times daily. 05/01/20   Russella Dar, NP  oxyCODONE (OXY IR/ROXICODONE) 5 MG immediate release tablet Place 0.5 tablets (2.5 mg total) into feeding tube every 8 (eight) hours. 05/01/20   Russella Dar, NP  polyethylene glycol (MIRALAX / GLYCOLAX) 17 g packet Place 17 g into feeding tube daily as needed for moderate constipation. 05/01/20   Russella Dar, NP  rosuvastatin (CRESTOR) 5 MG tablet Place  0.5 tablets (2.5 mg total) into feeding tube daily at 6 PM. 05/01/20   Russella Dar, NP  Water For Irrigation, Sterile (FREE WATER) SOLN Place 200 mLs into feeding tube every 6 (six) hours. 05/01/20   Russella Dar, NP    Physical Exam: Vitals:   09/29/20 1300 09/29/20 1328 09/29/20 1600 09/29/20 1852  BP: 116/60  114/60 (!) 155/88  Pulse: 87  (!) 53 90  Resp: (!) 26  (!) 23 20  Temp:  97.6 F (36.4 C)  98 F (36.7 C)  TempSrc:  Oral  Axillary  SpO2: 97%  99% 97%     General:  Appears chronically ill, awake but not verbally responsive or interactive Eyes:  PERRL, EOMI, normal lids, iris ENT:  grossly normal hearing, lips & tongue, mmm Neck:  no LAD, masses or thyromegaly Cardiovascular:  RRR, no m/r/g. No LE edema.  Respiratory:   Scattered rhonchi.  Normal respiratory effort. Abdomen:  soft, NT, ND; PEG tube in place Skin:  no rash or induration seen on limited exam Musculoskeletal:  contractures of both upper and lower extremities Psychiatric:  awake and alert but not interactive Neurologic:  unable to perform    Radiological Exams on Admission: Independently reviewed - see discussion in A/P where applicable  DG Chest Portable 1 View  Result Date: 09/29/2020 CLINICAL DATA:  87 year old female with history of cough and wheezing. EXAM: PORTABLE CHEST 1 VIEW COMPARISON:  Chest x-ray 04/29/2020. FINDINGS: Lung volumes are low. Mild chronic scarring in the periphery of the left lung base. No consolidative airspace disease. No pleural effusions. No pneumothorax. No pulmonary nodule or mass noted. Pulmonary vasculature and the cardiomediastinal silhouette are within normal limits. Severe calcifications of the mitral annulus. Atherosclerotic calcifications in the thoracic aorta. Surgical clips project over the right upper quadrant of the abdomen, likely from prior cholecystectomy. Status post bilateral shoulder arthroplasty. IMPRESSION: 1. Low lung volumes without radiographic  evidence of acute cardiopulmonary disease. 2. Aortic atherosclerosis. 3. Severe calcifications of the mitral annulus. Electronically Signed   By: Trudie Reed M.D.   On: 09/29/2020 06:30    EKG: Independently reviewed.  Atrial flutter with rate 97; nonspecific ST changes with no evidence of acute ischemia   Labs on Admission: I have personally reviewed the available labs and imaging studies at the time of the admission.  Pertinent labs:   Na++ 133 Glucose 218 BUN 80/Creatinine 1.87/GFR 26; 37/0.93/>60 on 04/29/20 Albumin 2.3 Iron 9 Ferritin 466 WBC 8.9 Hgb 8.5; 11.2 on 04/29/20 Heme negative UA: >500 glucose, small Hgb, large LE, few bacteria COVID/flu negative Urine culture pending   Assessment/Plan Principal Problem:   AKI (acute kidney injury) (HCC) Active Problems:   HTN (hypertension)   Diabetes mellitus (HCC)   Hypothyroidism   Dyslipidemia   Stroke (HCC)   DNR (do not resuscitate)   AKI -Normal baseline renal function, currently with AKI -She is on tube feeds so she should be receiving appropriate hydration -She may have increased metabolic needs in the setting of infection, but there is no current evidence of PNA on CXR (despite hypoxia) and UA does not appear to indicate UTI (culture pending) -Aspiration PNA is a consideration but no current evidence on CXR -Pressure ulcer may also be contributing (wound care consulted) -Will admit for further evaluation -Will monitor on Med Surg for now -Will request nutrition evaluation  H/o CVA -Significant deficits on exam -PEG tube, no PO intake -Continue Plavix, Crestor -Continue Provigil, Robaxin, Oxy IR  DM -Continue Levemir -Cover with moderate-scale SSI   HTN -Continue Norvasc, hydralazine  Hypothyroidism -Continue Synthroid  Hypoxia -Chronic hypoxia of uncertain cause, on [place] O2 at her facility -Continue to monitor for now without intervention other than increased O2 if needed -If ongoing concerns  about respiratory status, consider repeat CXR  Goals of care -Patient appears to have a very limited QOL  -Ongoing GOC conversations are important -Has DNR form at the bedside -Palliative care consult    Note: This patient has been tested and is negative for the novel coronavirus COVID-19.    Level of care: Med-Surg DVT prophylaxis:  Lovenox  Code Status:  DNR - confirmed with family, ACP paperwork Family Communication: None present; I spoke with the patient's husband by telephone at the time of admission. Disposition Plan:  The patient is from: SNF  Anticipated d/c is to: SNF  Anticipated d/c date will depend on clinical response to treatment, likely several days  Patient is currently: acutely ill Consults called: Palliative care; Nutrition; Wound care  Admission status:  Admit - It is my clinical opinion that admission to INPATIENT is reasonable and necessary because of the expectation that this patient will require hospital care that crosses at least 2 midnights to treat this condition based on the medical complexity of the problems presented.  Given the aforementioned information, the predictability of an adverse outcome is felt to be significant.    Jonah Blue MD [facility]   How to contact the [facility] Attending or Consulting provider 7A - 7P or covering provider during after hours 7P -7A, for this patient?  Check the care team in [facility] and look for a) attending/consulting TR[facility] provider listed and b) the [facility] team listed Log into [email or web address] and use [place]'s universal password to access. If you do not have the password, please contact the hospital operator. Locate the [facility] provider you are looking for under [facility] and page to  a number that you can be directly reached. If you still have difficulty reaching the provider, please page the [facility] (Director on Call) for the Hospitalists listed on amion for assistance.   09/29/2020, 8:27 PM

## 2020-09-29 NOTE — ED Triage Notes (Addendum)
Pt BIB [facility] from [facility] c/o [facility]. Pt is there due to a CVA, contractures and non verbal. Pt unable to follow commands. Per staff pt was coughing and wheezing and was concerned. Pt normally wears 2L [place]. O2 saturations were low O2 increased to 2.5L. Pt was given 15 mg of albuterol, 1mg  of atrovent and 125 mg of soulmedrol.

## 2020-09-29 NOTE — Consult Note (Addendum)
WOC Nurse Consult Note: Patient receiving care in [facility] [phone] Brought into the ED today by EMS from [facility]. Patient is non-verbal and has bilateral wrist contractures.  Reason for Consult: Sacral wound Wound type: Unstageable sacral wound Pressure Injury POA: Yes Measurement: 4 cm x 4 cm x 0.5 cm with undermining of 0.8 cm from 9 o'clock ti 7 o'clock Wound bed: 100% yellow slough down to the bone Drainage (amount, consistency, odor) Sanguinous and malodorous Periwound: Erythematous Dressing procedure/placement/frequency: Clean the entire sacral area with soap and water, rinse and pat dry. Flush the wound with NS. Moisten Kerlix with Dakin's solution, insert into the wound, followed by dry gauze, cover with ABD pads and secure with Medipore tape. Twice daily dressing changes x 3 days. Once this order has expired then it will be followed by twice daily NS wet to dry dressings.   Monitor the wound area(s) for worsening of condition such as: Signs/symptoms of infection, increase in size, development of or worsening of odor, development of pain, or increased pain at the affected locations.   Notify the medical team if any of these develop.  Pressure Injury Prevention Bundle May use any that apply to this patient. Support surfaces (air mattress) chair cushion [place] # [phone]) Heel offloading boots [place] # [phone]) Turning and Positioning  Measures to reduce shear (draw sheet, knees up) Skin protection Products (Foam dressing) Moisture management products (Critic-Aid Barrier Cream (Purple top) Sween moisturizing lotion (Pink top in clean supply) Nutrition Management Protection for Medical Devices Routine Skin Assessment   Thank you for the consult. WOC nurse will not follow at this time.   Please re-consult the WOC team if needed.  Renaldo Reel Katrinka Blazing, MSN, RN, CMSRN, Angus Seller, [facility] Wound Treatment Associate Pager [phone]

## 2020-09-29 NOTE — ED Notes (Signed)
Report attempted x2

## 2020-09-29 NOTE — ED Notes (Signed)
Report attempted. Left number with Unit.  RN to return call.

## 2020-09-30 ENCOUNTER — Inpatient Hospital Stay (HOSPITAL_COMMUNITY): Payer: Medicare Other

## 2020-09-30 LAB — CBC
HCT: 26.3 % — ABNORMAL LOW (ref 36.0–46.0)
Hemoglobin: 8.1 g/dL — ABNORMAL LOW (ref 12.0–15.0)
MCH: 29.5 pg (ref 26.0–34.0)
MCHC: 30.8 g/dL (ref 30.0–36.0)
MCV: 95.6 fL (ref 80.0–100.0)
Platelets: 334 10*3/uL (ref 150–400)
RBC: 2.75 MIL/uL — ABNORMAL LOW (ref 3.87–5.11)
RDW: 14 % (ref 11.5–15.5)
WBC: 11.5 10*3/uL — ABNORMAL HIGH (ref 4.0–10.5)
nRBC: 0 % (ref 0.0–0.2)

## 2020-09-30 LAB — GLUCOSE, CAPILLARY
Glucose-Capillary: 128 mg/dL — ABNORMAL HIGH (ref 70–99)
Glucose-Capillary: 184 mg/dL — ABNORMAL HIGH (ref 70–99)
Glucose-Capillary: 212 mg/dL — ABNORMAL HIGH (ref 70–99)
Glucose-Capillary: 241 mg/dL — ABNORMAL HIGH (ref 70–99)
Glucose-Capillary: 324 mg/dL — ABNORMAL HIGH (ref 70–99)
Glucose-Capillary: 72 mg/dL (ref 70–99)
Glucose-Capillary: 73 mg/dL (ref 70–99)
Glucose-Capillary: 75 mg/dL (ref 70–99)

## 2020-09-30 LAB — BASIC METABOLIC PANEL
Anion gap: 9 (ref 5–15)
BUN: 78 mg/dL — ABNORMAL HIGH (ref 8–23)
CO2: 29 mmol/L (ref 22–32)
Calcium: 9.1 mg/dL (ref 8.9–10.3)
Chloride: 98 mmol/L (ref 98–111)
Creatinine, Ser: 1.73 mg/dL — ABNORMAL HIGH (ref 0.44–1.00)
GFR, Estimated: 29 mL/min — ABNORMAL LOW (ref >60)
Glucose, Bld: 295 mg/dL — ABNORMAL HIGH (ref 70–99)
Potassium: 4.6 mmol/L (ref 3.5–5.1)
Sodium: 136 mmol/L (ref 135–145)

## 2020-09-30 MED ORDER — SODIUM CHLORIDE 0.9 % IV SOLN
1.0000 g | INTRAVENOUS | Status: DC
  Administered 2020-09-30: 1 g via INTRAVENOUS
  Filled 2020-09-30 (×2): qty 10

## 2020-09-30 MED ORDER — PROSOURCE TF PO LIQD
45.0000 mL | Freq: Two times a day (BID) | ORAL | Status: DC
  Administered 2020-09-30 – 2020-10-03 (×7): 45 mL
  Filled 2020-09-30 (×7): qty 45

## 2020-09-30 MED ORDER — GLUCERNA 1.2 CAL PO LIQD
1000.0000 mL | ORAL | Status: DC
  Administered 2020-09-30 – 2020-10-03 (×4): 1000 mL
  Filled 2020-09-30 (×6): qty 1000

## 2020-09-30 NOTE — Progress Notes (Signed)
PROGRESS NOTE    Hannah Fischer  GEX:[id number] DOB: Oct 28, 1932 DOA: 09/29/2020 PCP: Sigmund Hazel, MD   Brief Narrative: 87 year old with past medical history significant for CVA with contractures, nonverbal, unable to follow commands, chronic pain syndrome, diabetes, hypothyroidism, PE, chronic UPJ obstruction with a history of percutaneous nephrostomy tube and solitary kidney presents with shortness of breath.  She was also noted to be hypoxic despite nasal cannula oxygen the morning of admission.  Patient is chronically on 2 L of oxygen.    Assessment & Plan:   Principal Problem:   AKI (acute kidney injury) (HCC) Active Problems:   HTN (hypertension)   Diabetes mellitus (HCC)   Hypothyroidism   Dyslipidemia   Stroke (HCC)   DNR (do not resuscitate)   1-AKI; In the setting of UTI, prerenal increased metabolic needs. Plan to continue with IV fluids. -Creatinine peak 1.8 -Creatinine down to 1.7.  2-UTI: Patient with more than 50 white blood cells on UA.  Urine culture growing gram-negative rods.   -Plan to start ceftriaxone. -Follow urine cultures  History of CVA: Patient with significant deficits on exam. -On tube feeding for nutrition.  -Continue with Plavix, Crestor, Provigil, Robaxin and Oxy IR -Palliative care consult for goals of care  Diabetes: Continue with Levemir and sliding scale insulin  Hypertension: Continue with Norvasc and hydralazine  Hypothyroidism: Continue with Synthroid  Hypoxia; Chronic Hypoxic respiratory failure.  Chest x-ray on admission showed low lung volumes without right radiographic evidence of acute cardiopulmonary disease. -Repeated chest x-ray to exclude pneumonia showed: Probably skinfold create central line in the periphery of the left lower lung.  Attention of subsequent chest x-ray to exclude less likely possibility of pneumothorax.  Low inspiratory volume with left bibasilar atelectasis. Currently on 2 L of oxygen. Plan Repeat  chest x-ray tomorrow.   Anemia: Iron Deficiency: Plan to start iron supplements when infection sources  Goals of care: Patient appears to have very limited quality of life.  Palliative care consulted for conversation  in regards to goals of care  See wound documentation below:   Pressure Injury 09/29/20 Sacrum Medial (Active)  09/29/20 2030  Location: Sacrum  Location Orientation: Medial  Staging:   Wound Description (Comments):   Present on Admission:      Nutrition Problem: Increased nutrient needs Etiology: wound healing    Signs/Symptoms: estimated needs    Interventions: Tube feeding  Estimated body mass index is 31.09 kg/m as calculated from the following:   Height as of 03/18/20: 5\' 3"  (1.6 m).   Weight as of 04/26/20: 79.6 kg.   DVT prophylaxis: Lovenox Code Status: DNR Family Communication: Care discussed with husband who was at bedside Disposition Plan:  Status is: Inpatient  Remains inpatient appropriate because:IV treatments appropriate due to intensity of illness or inability to take PO  Dispo: The patient is from: SNF              Anticipated d/c is to: SNF              Patient currently is not medically stable to d/c.   Difficult to place patient No        Consultants:  none  Procedures:  none  Antimicrobials:    Subjective: Patient is not alert, she kept her eyes closed, she is not following command, she is nonverbal.   Objective: Vitals:   09/29/20 2045 09/29/20 2359 09/30/20 0500 09/30/20 0855  BP: (!) 158/69 140/66 (!) 155/83 (!) 165/83  Pulse: 89 83 80  91  Resp:   16 15  Temp: 98.1 F (36.7 C) 97.6 F (36.4 C) (!) 97.5 F (36.4 C) (!) 97.5 F (36.4 C)  TempSrc: Oral Oral Oral Axillary  SpO2: 97% 96% 100% 98%    Intake/Output Summary (Last 24 hours) at 09/30/2020 1127 Last data filed at 09/30/2020 0700 Gross per 24 hour  Intake 2735.63 ml  Output 1200 ml  Net 1535.63 ml   There were no vitals filed for this  visit.  Examination:  General exam: lethargic, eyes close.  Respiratory system: decreased breath sounds.  Cardiovascular system: S1 & S2 heard, RRR. Gastrointestinal system: Abdomen is nondistended, soft and nontender. No organomegaly or masses felt. Normal bowel sounds heard. Central nervous system: lethargic, contracture upper extremities.  Extremities: no edema,     Data Reviewed: I have personally reviewed following labs and imaging studies  CBC: Recent Labs  Lab 09/29/20 0545 09/30/20 0222  WBC 8.9 11.5*  NEUTROABS 6.0  --   HGB 8.5* 8.1*  HCT 28.9* 26.3*  MCV 99.3 95.6  PLT 320 334   Basic Metabolic Panel: Recent Labs  Lab 09/29/20 0545 09/30/20 0222  NA 133* 136  K 4.8 4.6  CL 93* 98  CO2 29 29  GLUCOSE 218* 295*  BUN 80* 78*  CREATININE 1.87* 1.73*  CALCIUM 9.0 9.1   GFR: CrCl cannot be calculated (Unknown ideal weight.). Liver Function Tests: Recent Labs  Lab 09/29/20 0545  AST 26  ALT 21  ALKPHOS 95  BILITOT 0.7  PROT 6.2*  ALBUMIN 2.3*   No results for input(s): LIPASE, AMYLASE in the last 168 hours. No results for input(s): AMMONIA in the last 168 hours. Coagulation Profile: No results for input(s): INR, PROTIME in the last 168 hours. Cardiac Enzymes: No results for input(s): CKTOTAL, CKMB, CKMBINDEX, TROPONINI in the last 168 hours. BNP (last 3 results) No results for input(s): PROBNP in the last 8760 hours. HbA1C: No results for input(s): HGBA1C in the last 72 hours. CBG: Recent Labs  Lab 09/29/20 1421 09/29/20 2103 09/30/20 0004 09/30/20 0456 09/30/20 0902  GLUCAP 361* 346* 324* 241* 212*   Lipid Profile: No results for input(s): CHOL, HDL, LDLCALC, TRIG, CHOLHDL, LDLDIRECT in the last 72 hours. Thyroid Function Tests: No results for input(s): TSH, T4TOTAL, FREET4, T3FREE, THYROIDAB in the last 72 hours. Anemia Panel: Recent Labs    09/29/20 0733  VITAMINB12 406  FOLATE 34.0  FERRITIN 466*  TIBC 204*  IRON 9*   RETICCTPCT 1.8   Sepsis Labs: No results for input(s): PROCALCITON, LATICACIDVEN in the last 168 hours.  Recent Results (from the past 240 hour(s))  Resp Panel by RT-PCR (Flu A&B, Covid) Nasopharyngeal Swab     Status: None   Collection Time: 09/29/20  7:39 AM   Specimen: Nasopharyngeal Swab; Nasopharyngeal(NP) swabs in vial transport medium  Result Value Ref Range Status   SARS Coronavirus 2 by RT PCR NEGATIVE NEGATIVE Final    Comment: (NOTE) SARS-CoV-2 target nucleic acids are NOT DETECTED.  The SARS-CoV-2 RNA is generally detectable in upper respiratory specimens during the acute phase of infection. The lowest concentration of SARS-CoV-2 viral copies this assay can detect is 138 copies/mL. A negative result does not preclude SARS-Cov-2 infection and should not be used as the sole basis for treatment or other patient management decisions. A negative result may occur with  improper specimen collection/handling, submission of specimen other than nasopharyngeal swab, presence of viral mutation(s) within the areas targeted by this assay, and inadequate  number of viral copies(<138 copies/mL). A negative result must be combined with clinical observations, patient history, and epidemiological information. The expected result is Negative.  Fact Sheet for Patients:  BloggerCourse.comhttps://www.fda.gov/media/152166/download  Fact Sheet for Healthcare Providers:  SeriousBroker.ithttps://www.fda.gov/media/152162/download  This test is no t yet approved or cleared by the [place] FDA and  has been authorized for detection and/or diagnosis of SARS-CoV-2 by FDA under an Emergency Use Authorization (EUA). This EUA will remain  in effect (meaning this test can be used) for the duration of the COVID-19 declaration under Section 564(b)(1) of the Act, 21 U.S.C.section 360bbb-3(b)(1), unless the authorization is terminated  or revoked sooner.       Influenza A by PCR NEGATIVE NEGATIVE Final   Influenza B by PCR  NEGATIVE NEGATIVE Final    Comment: (NOTE) The Xpert Xpress SARS-CoV-2/FLU/RSV plus assay is intended as an aid in the diagnosis of influenza from Nasopharyngeal swab specimens and should not be used as a sole basis for treatment. Nasal washings and aspirates are unacceptable for Xpert Xpress SARS-CoV-2/FLU/RSV testing.  Fact Sheet for Patients: BloggerCourse.comhttps://www.fda.gov/media/152166/download  Fact Sheet for Healthcare Providers: SeriousBroker.ithttps://www.fda.gov/media/152162/download  This test is not yet approved or cleared by the [place] FDA and has been authorized for detection and/or diagnosis of SARS-CoV-2 by FDA under an Emergency Use Authorization (EUA). This EUA will remain in effect (meaning this test can be used) for the duration of the COVID-19 declaration under Section 564(b)(1) of the Act, 21 U.S.C. section 360bbb-3(b)(1), unless the authorization is terminated or revoked.  Performed at [facility], [address] [place], [place] [address]   Urine Culture     Status: Abnormal (Preliminary result)   Collection Time: 09/29/20  9:34 AM   Specimen: Urine, Catheterized  Result Value Ref Range Status   Specimen Description URINE, CATHETERIZED  Final   Special Requests NONE  Final   Culture (A)  Final    >=100,000 COLONIES/mL GRAM NEGATIVE RODS SUSCEPTIBILITIES TO FOLLOW CULTURE REINCUBATED FOR BETTER GROWTH Performed at [facility], [address] [place], [place] [address]    Report Status PENDING  Incomplete         Radiology Studies: DG Chest Portable 1 View  Result Date: 09/29/2020 CLINICAL DATA:  87 year old female with history of cough and wheezing. EXAM: PORTABLE CHEST 1 VIEW COMPARISON:  Chest x-ray 04/29/2020. FINDINGS: Lung volumes are low. Mild chronic scarring in the periphery of the left lung base. No consolidative airspace disease. No pleural effusions. No pneumothorax. No pulmonary nodule or mass noted. Pulmonary vasculature and the  cardiomediastinal silhouette are within normal limits. Severe calcifications of the mitral annulus. Atherosclerotic calcifications in the thoracic aorta. Surgical clips project over the right upper quadrant of the abdomen, likely from prior cholecystectomy. Status post bilateral shoulder arthroplasty. IMPRESSION: 1. Low lung volumes without radiographic evidence of acute cardiopulmonary disease. 2. Aortic atherosclerosis. 3. Severe calcifications of the mitral annulus. Electronically Signed   By: Trudie Reedaniel  Entrikin M.D.   On: 09/29/2020 06:30        Scheduled Meds:  acetaminophen  650 mg Per NG tube Q6H   amLODipine  10 mg Per Tube Daily   Chlorhexidine Gluconate Cloth  6 each Topical Daily   clopidogrel  75 mg Per Tube Daily   docusate  100 mg Per Tube BID   enoxaparin (LOVENOX) injection  30 mg Subcutaneous Q24H   feeding supplement (PROSource TF)  45 mL Per Tube BID   ferrous sulfate  325 mg Oral  Q breakfast   hydrALAZINE  10 mg Per Tube TID   insulin detemir  11 Units Subcutaneous BID   levothyroxine  112 mcg Per Tube QAC breakfast   methocarbamol  750 mg Per Tube QID   modafinil  200 mg Per Tube Daily   oxyCODONE  5 mg Per Tube Q6H   rosuvastatin  2.5 mg Per Tube q1800   sodium hypochlorite   Irrigation BID   Continuous Infusions:  cefTRIAXone (ROCEPHIN)  IV     feeding supplement (GLUCERNA 1.2 CAL)     lactated ringers 75 mL/hr at 09/30/20 0700     LOS: 1 day    Time spent: 35 minutes.     Alba Cory, MD [facility]   If 7PM-7AM, please contact night-coverage [email or web address]  09/30/2020, 11:27 AM

## 2020-09-30 NOTE — Progress Notes (Signed)
Initial Nutrition Assessment  DOCUMENTATION CODES:   Not applicable  INTERVENTION:   Tube feeding:  -Glucerna 1.2 @ 55 ml/hr via PEG (1320 ml) -ProSource TF 45 ml BID -Free water flushes 200 ml TID  Provides: 1664 kcals, 95 grams protein, 1065 ml free water (1665 ml with flushes)  NUTRITION DIAGNOSIS:   Increased nutrient needs related to wound healing as evidenced by estimated needs.  GOAL:   Patient will meet greater than or equal to 90% of their needs  MONITOR:   Weight trends, Labs, I & O's, Diet advancement, Skin, TF tolerance  REASON FOR ASSESSMENT:   Consult Enteral/tube feeding initiation and management  ASSESSMENT:   Patient with PMH significant for DM, diverticular disease, GERD, HLD, HTN, IBS, CVA s/p PEG in March 2022, chronic UVJ obstruction with history of perc nephrostomy tube, and solitary kidney. Presents this admission with AKI.  Unable to obtain history from patient. Noted contractures and new wound on sacrum. Patient does not take anything by mouth. Patient had PEG placed on 3/14 and was receiving Glucerna 1.2 @ 45 ml/hr with ProSource 45 ml TID. Husband is caretaker at home. Will need to discuss with him if this tube feeding regimen was followed at home. Will increase Glucerna rate to provide more kcal/protein given wound.   No admission weight was obtained. Will need to obtain admission weight to determine weight loss.   UOP: 1200 ml x 24 hrs   Drips: LR @ 75 ml/hr  Medications: colace, levemir Labs: CBG [id number]  Diet Order:   Diet Order             Diet NPO time specified  Diet effective now                   EDUCATION NEEDS:   Not appropriate for education at this time  Skin:  Skin Assessment: Skin Integrity Issues: Skin Integrity Issues:: Other (Comment) Other: pressure injury- sacrum  Last BM:  unknown  Height:   Ht Readings from Last 1 Encounters:  03/18/20 5\' 3"  (1.6 m)    Weight:   Wt Readings from Last 1  Encounters:  04/26/20 79.6 kg    BMI:  There is no height or weight on file to calculate BMI.  Estimated Nutritional Needs:   Kcal:  1600-1800 kcal  Protein:  80- 95 grams  Fluid:  >/= 1.6 L/day   04/28/20 MS, RD, LDN, CNSC Clinical Nutrition Pager listed in AMION

## 2020-09-30 NOTE — Progress Notes (Signed)
   Palliative Medicine Inpatient Follow Up Note  Palliative care is familiar with Alysse from her prior hospitalization from February - March. I have called her spouse, Bonnee Quin to arrange a family meeting tomorrow at [facility]. Additional note to follow then.  No Charge ______________________________________________________________________________________ [facility] [place] Palliative Medicine Team Team Cell Phone: [phone] Please utilize secure chat with additional questions, if there is no response within 30 minutes please call the above phone number  Palliative Medicine Team providers are available by phone from 7am to 7pm daily and can be reached through the team cell phone.  Should this patient require assistance outside of these hours, please call the patient's attending physician.

## 2020-10-01 ENCOUNTER — Inpatient Hospital Stay (HOSPITAL_COMMUNITY): Payer: Medicare Other

## 2020-10-01 DIAGNOSIS — Z66 Do not resuscitate: Secondary | ICD-10-CM

## 2020-10-01 DIAGNOSIS — Z515 Encounter for palliative care: Secondary | ICD-10-CM

## 2020-10-01 LAB — GLUCOSE, CAPILLARY
Glucose-Capillary: 78 mg/dL (ref 70–99)
Glucose-Capillary: 88 mg/dL (ref 70–99)
Glucose-Capillary: 137 mg/dL — ABNORMAL HIGH (ref 70–99)
Glucose-Capillary: 116 mg/dL — ABNORMAL HIGH (ref 70–99)
Glucose-Capillary: 110 mg/dL — ABNORMAL HIGH (ref 70–99)

## 2020-10-01 LAB — CBC
HCT: 27.4 % — ABNORMAL LOW (ref 36.0–46.0)
Hemoglobin: 8.5 g/dL — ABNORMAL LOW (ref 12.0–15.0)
MCH: 29.9 pg (ref 26.0–34.0)
MCHC: 31 g/dL (ref 30.0–36.0)
MCV: 96.5 fL (ref 80.0–100.0)
Platelets: 354 10*3/uL (ref 150–400)
RBC: 2.84 MIL/uL — ABNORMAL LOW (ref 3.87–5.11)
RDW: 14.2 % (ref 11.5–15.5)
WBC: 10 10*3/uL (ref 4.0–10.5)
nRBC: 0 % (ref 0.0–0.2)

## 2020-10-01 LAB — HEMOGLOBIN A1C
Hgb A1c MFr Bld: 6.4 % — ABNORMAL HIGH (ref 4.8–5.6)
Mean Plasma Glucose: 136.98 mg/dL

## 2020-10-01 LAB — BASIC METABOLIC PANEL
Anion gap: 10 (ref 5–15)
BUN: 77 mg/dL — ABNORMAL HIGH (ref 8–23)
CO2: 29 mmol/L (ref 22–32)
Calcium: 9.1 mg/dL (ref 8.9–10.3)
Chloride: 100 mmol/L (ref 98–111)
Creatinine, Ser: 1.41 mg/dL — ABNORMAL HIGH (ref 0.44–1.00)
GFR, Estimated: 37 mL/min — ABNORMAL LOW (ref >60)
Glucose, Bld: 76 mg/dL (ref 70–99)
Potassium: 4.3 mmol/L (ref 3.5–5.1)
Sodium: 139 mmol/L (ref 135–145)

## 2020-10-01 LAB — URINE CULTURE: Culture: >=100000 — AB

## 2020-10-01 MED ORDER — SODIUM CHLORIDE 0.9 % IV SOLN
1.5000 g | Freq: Three times a day (TID) | INTRAVENOUS | Status: DC
  Administered 2020-10-01 – 2020-10-03 (×8): 1.5 g via INTRAVENOUS
  Filled 2020-10-01 (×11): qty 4

## 2020-10-01 MED ORDER — INSULIN DETEMIR 100 UNIT/ML ~~LOC~~ SOLN
11.0000 [IU] | Freq: Every day | SUBCUTANEOUS | Status: DC
  Administered 2020-10-02 – 2020-10-03 (×2): 11 [IU] via SUBCUTANEOUS
  Filled 2020-10-01 (×3): qty 0.11

## 2020-10-01 MED ORDER — INSULIN DETEMIR 100 UNIT/ML ~~LOC~~ SOLN: 8.0000 [IU] | Freq: Two times a day (BID) | SUBCUTANEOUS | Status: DC

## 2020-10-01 MED ORDER — INSULIN ASPART 100 UNIT/ML IJ SOLN
0.0000 [IU] | Freq: Three times a day (TID) | INTRAMUSCULAR | Status: DC
Start: 2020-10-01 — End: 2020-10-04
  Administered 2020-10-02: 1 [IU] via SUBCUTANEOUS
  Administered 2020-10-02: 2 [IU] via SUBCUTANEOUS
  Administered 2020-10-03: 1 [IU] via SUBCUTANEOUS
  Administered 2020-10-03: 2 [IU] via SUBCUTANEOUS
  Administered 2020-10-03: 1 [IU] via SUBCUTANEOUS

## 2020-10-01 NOTE — Progress Notes (Signed)
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  She was also noted to be hypoxic despite nasal cannula oxygen the morning of admission.  Patient is chronically on 2 L of oxygen.    Assessment & Plan:   Principal Problem:   AKI (acute kidney injury) (HCC) Active Problems:   HTN (hypertension)   Diabetes mellitus (HCC)   Hypothyroidism   Dyslipidemia   Stroke (HCC)   DNR (do not resuscitate)   1-AKI; In the setting of UTI, prerenal increased metabolic needs. Plan to continue with IV fluids. -Creatinine peak 1.8 -Creatinine down to 1.7.---1.4  2-UTI: Patient with more than 50 white blood cells on UA.   Urine culture growing: E coli and enterococcus, sensitive to Unasyn.  -change antibiotics to Unasyn.  -Could do Nitrofurantoin at discharge.   History of CVA: Patient with significant deficits on exam. -On tube feeding for nutrition.  -Continue with Plavix, Crestor, Provigil, Robaxin and Oxy IR -Palliative care consult for goals of care  Diabetes: Continue with Levemir and sliding scale insulin Cbg low range, change levemir to daily  Hypertension: Continue with Norvasc and hydralazine  Hypothyroidism: Continue with Synthroid  Hypoxia; Chronic Hypoxic respiratory failure.  -suspect worsening hypoxemia related to hypoventilation.  Chest x-ray on admission showed low lung volumes without right radiographic evidence of acute cardiopulmonary disease. -Repeated chest x-ray to exclude pneumonia showed: Probably skinfold create central line in the periphery of the left lower lung.  Attention of subsequent chest  x-ray to exclude less likely possibility of pneumothorax.  Low inspiratory volume with left bibasilar atelectasis. Currently on 2 L of oxygen. X ray negative for Pneumothorax.    Anemia: Iron Deficiency: Plan to start iron supplements when infection sources  Goals of care: Patient appears to have very limited quality of life.  Palliative care consulted for conversation  in regards to goals of care Palliative care meeting today.   See wound documentation below:   Pressure Injury 09/29/20 Sacrum Medial (Active)  09/29/20 2030  Location: Sacrum  Location Orientation: Medial  Staging:   Wound Description (Comments):   Present on Admission:      Nutrition Problem: Increased nutrient needs Etiology: wound healing    Signs/Symptoms: estimated needs    Interventions: Tube feeding  Estimated body mass index is 31.09 kg/m as calculated from the following:   Height as of 03/18/20: 5\' 3"  (1.6 m).   Weight as of 04/26/20: 79.6 kg.   DVT prophylaxis: Lovenox Code Status: DNR Family Communication: Care discussed with husband who was at bedside Disposition Plan:  Status is: Inpatient  Remains inpatient appropriate because:IV treatments appropriate due to intensity of illness or inability to take PO  Dispo: The patient is from: SNF              Anticipated d/c is to: SNF              Patient currently is not medically stable to d/c.   Difficult to place patient No        Consultants:  none  Procedures:  none  Antimicrobials:    Subjective: She is non verbal. Open eyes   Objective: Vitals:   09/30/20 2315 10/01/20 [id number]  10/01/20 0731 10/01/20 0954  BP: 135/88 132/78 (!) 153/82 (!) 164/67  Pulse: 98 (!) 104 (!) 109   Resp: 15 18 18    Temp: 98.4 F (36.9 C) 99.4 F (37.4 C) 98.9 F (37.2 C)   TempSrc: Oral Oral Oral   SpO2: 99% 99% 100%     Intake/Output Summary (Last 24 hours) at 10/01/2020 1024 Last data filed at 10/01/2020 0800 Gross per 24 hour   Intake 2255.79 ml  Output 600 ml  Net 1655.79 ml    There were no vitals filed for this visit.  Examination:  General exam: alert today, non verbal.  Respiratory system: Decreased breath sounds.  Cardiovascular system: S 1, S 2  RRR Gastrointestinal system: BS present, soft, nt, peg tube in place.  Central nervous system: eyes open today  contracture upper extremities.  Extremities: no edema    Data Reviewed: I have personally reviewed following labs and imaging studies  CBC: Recent Labs  Lab 09/29/20 0545 09/30/20 0222 10/01/20 0052  WBC 8.9 11.5* 10.0  NEUTROABS 6.0  --   --   HGB 8.5* 8.1* 8.5*  HCT 28.9* 26.3* 27.4*  MCV 99.3 95.6 96.5  PLT 320 334 354    Basic Metabolic Panel: Recent Labs  Lab 09/29/20 0545 09/30/20 0222 10/01/20 0052  NA 133* 136 139  K 4.8 4.6 4.3  CL 93* 98 100  CO2 29 29 29   GLUCOSE 218* 295* 76  BUN 80* 78* 77*  CREATININE 1.87* 1.73* 1.41*  CALCIUM 9.0 9.1 9.1    GFR: CrCl cannot be calculated (Unknown ideal weight.). Liver Function Tests: Recent Labs  Lab 09/29/20 0545  AST 26  ALT 21  ALKPHOS 95  BILITOT 0.7  PROT 6.2*  ALBUMIN 2.3*    No results for input(s): LIPASE, AMYLASE in the last 168 hours. No results for input(s): AMMONIA in the last 168 hours. Coagulation Profile: No results for input(s): INR, PROTIME in the last 168 hours. Cardiac Enzymes: No results for input(s): CKTOTAL, CKMB, CKMBINDEX, TROPONINI in the last 168 hours. BNP (last 3 results) No results for input(s): PROBNP in the last 8760 hours. HbA1C: No results for input(s): HGBA1C in the last 72 hours. CBG: Recent Labs  Lab 09/30/20 1942 09/30/20 2119 09/30/20 2314 10/01/20 0322 10/01/20 0732  GLUCAP 73 75 72 78 88    Lipid Profile: No results for input(s): CHOL, HDL, LDLCALC, TRIG, CHOLHDL, LDLDIRECT in the last 72 hours. Thyroid Function Tests: No results for input(s): TSH, T4TOTAL, FREET4, T3FREE, THYROIDAB in the last 72  hours. Anemia Panel: Recent Labs    09/29/20 0733  VITAMINB12 406  FOLATE 34.0  FERRITIN 466*  TIBC 204*  IRON 9*  RETICCTPCT 1.8    Sepsis Labs: No results for input(s): PROCALCITON, LATICACIDVEN in the last 168 hours.  Recent Results (from the past 240 hour(s))  Resp Panel by RT-PCR (Flu A&B, Covid) Nasopharyngeal Swab     Status: None   Collection Time: 09/29/20  7:39 AM   Specimen: Nasopharyngeal Swab; Nasopharyngeal(NP) swabs in vial transport medium  Result Value Ref Range Status   SARS Coronavirus 2 by RT PCR NEGATIVE NEGATIVE Final    Comment: (NOTE) SARS-CoV-2 target nucleic acids are NOT DETECTED.  The SARS-CoV-2 RNA is generally detectable in upper respiratory specimens during the acute phase of infection. The lowest concentration of SARS-CoV-2 viral copies this assay can detect is 138 copies/mL. A negative result does not preclude SARS-Cov-2 infection and should not be used as the sole  basis for treatment or other patient management decisions. A negative result may occur with  improper specimen collection/handling, submission of specimen other than nasopharyngeal swab, presence of viral mutation(s) within the areas targeted by this assay, and inadequate number of viral copies(<138 copies/mL). A negative result must be combined with clinical observations, patient history, and epidemiological information. The expected result is Negative.  Fact Sheet for Patients:  BloggerCourse.com  Fact Sheet for Healthcare Providers:  SeriousBroker.it  This test is no t yet approved or cleared by the [place] FDA and  has been authorized for detection and/or diagnosis of SARS-CoV-2 by FDA under an Emergency Use Authorization (EUA). This EUA will remain  in effect (meaning this test can be used) for the duration of the COVID-19 declaration under Section 564(b)(1) of the Act, 21 U.S.C.section 360bbb-3(b)(1), unless the  authorization is terminated  or revoked sooner.       Influenza A by PCR NEGATIVE NEGATIVE Final   Influenza B by PCR NEGATIVE NEGATIVE Final    Comment: (NOTE) The Xpert Xpress SARS-CoV-2/FLU/RSV plus assay is intended as an aid in the diagnosis of influenza from Nasopharyngeal swab specimens and should not be used as a sole basis for treatment. Nasal washings and aspirates are unacceptable for Xpert Xpress SARS-CoV-2/FLU/RSV testing.  Fact Sheet for Patients: BloggerCourse.com  Fact Sheet for Healthcare Providers: SeriousBroker.it  This test is not yet approved or cleared by the [place] FDA and has been authorized for detection and/or diagnosis of SARS-CoV-2 by FDA under an Emergency Use Authorization (EUA). This EUA will remain in effect (meaning this test can be used) for the duration of the COVID-19 declaration under Section 564(b)(1) of the Act, 21 U.S.C. section 360bbb-3(b)(1), unless the authorization is terminated or revoked.  Performed at [facility], [address], [place], [place] [address]   Urine Culture     Status: Abnormal   Collection Time: 09/29/20  9:34 AM   Specimen: Urine, Catheterized  Result Value Ref Range Status   Specimen Description URINE, CATHETERIZED  Final   Special Requests   Final    NONE Performed at [facility], [address] [place], [place] [address]    Culture (A)  Final    >=100,000 COLONIES/mL ESCHERICHIA COLI Confirmed Extended Spectrum Beta-Lactamase Producer (ESBL).  In bloodstream infections from ESBL organisms, carbapenems are preferred over piperacillin/tazobactam. They are shown to have a lower risk of mortality. 60,000 COLONIES/mL ENTEROCOCCUS FAECALIS    Report Status 10/01/2020 FINAL  Final   Organism ID, Bacteria ESCHERICHIA COLI (A)  Final   Organism ID, Bacteria ENTEROCOCCUS FAECALIS (A)  Final      Susceptibility   Escherichia coli - MIC*     AMPICILLIN >=32 RESISTANT Resistant     CEFAZOLIN >=64 RESISTANT Resistant     CEFEPIME 4 INTERMEDIATE Intermediate     CEFTRIAXONE >=64 RESISTANT Resistant     CIPROFLOXACIN >=4 RESISTANT Resistant     GENTAMICIN <=1 SENSITIVE Sensitive     IMIPENEM <=0.25 SENSITIVE Sensitive     NITROFURANTOIN <=16 SENSITIVE Sensitive     TRIMETH/SULFA >=320 RESISTANT Resistant     AMPICILLIN/SULBACTAM 8 SENSITIVE Sensitive     PIP/TAZO <=4 SENSITIVE Sensitive     * >=100,000 COLONIES/mL ESCHERICHIA COLI   Enterococcus faecalis - MIC*    AMPICILLIN <=2 SENSITIVE Sensitive     NITROFURANTOIN <=16 SENSITIVE Sensitive     VANCOMYCIN 1 SENSITIVE Sensitive     * 60,000 COLONIES/mL ENTEROCOCCUS FAECALIS  Radiology Studies: DG CHEST PORT 1 VIEW  Result Date: 10/01/2020 CLINICAL DATA:  Shortness of breath. EXAM: PORTABLE CHEST 1 VIEW COMPARISON:  September 30, 2020. FINDINGS: The heart size and mediastinal contours are within normal limits. Right lung is clear. Minimal left basilar subsegmental atelectasis is noted with small left pleural effusion. Status post bilateral shoulder arthroplasties. Stable elevated right hemidiaphragm. IMPRESSION: Minimal left basilar subsegmental atelectasis with small left pleural effusion. Aortic Atherosclerosis (ICD10-I70.0). Electronically Signed   By: Lupita Raider M.D.   On: 10/01/2020 09:19   DG CHEST PORT 1 VIEW  Result Date: 09/30/2020 CLINICAL DATA:  87 year old female with hypoxemia and shortness of breath EXAM: PORTABLE CHEST 1 VIEW COMPARISON:  Prior chest x-ray 09/29/2020 FINDINGS: Cardiac and mediastinal contours are unchanged. Caseous calcification of the mitral valve annulus. Atherosclerotic calcifications along the transverse aorta. Probable skin fold creates a line along the periphery of the left upper lobe. Left reverse shoulder arthroplasty. Right shoulder hemiarthroplasty. Low inspiratory volumes. Probable left basilar atelectasis. IMPRESSION: 1.  Probable skin fold creates a line in the periphery of the left lung. Attention on subsequent chest x-rays to exclude less likely possibility of pneumothorax. 2. Low inspiratory volumes with left basilar atelectasis. 3. Aortic atherosclerosis. Electronically Signed   By: Malachy Moan M.D.   On: 09/30/2020 12:43        Scheduled Meds:  acetaminophen  650 mg Per NG tube Q6H   amLODipine  10 mg Per Tube Daily   Chlorhexidine Gluconate Cloth  6 each Topical Daily   clopidogrel  75 mg Per Tube Daily   docusate  100 mg Per Tube BID   enoxaparin (LOVENOX) injection  30 mg Subcutaneous Q24H   feeding supplement (PROSource TF)  45 mL Per Tube BID   ferrous sulfate  325 mg Oral Q breakfast   hydrALAZINE  10 mg Per Tube TID   insulin detemir  11 Units Subcutaneous BID   levothyroxine  112 mcg Per Tube QAC breakfast   methocarbamol  750 mg Per Tube QID   modafinil  200 mg Per Tube Daily   oxyCODONE  5 mg Per Tube Q6H   rosuvastatin  2.5 mg Per Tube q1800   sodium hypochlorite   Irrigation BID   Continuous Infusions:  cefTRIAXone (ROCEPHIN)  IV Stopped (09/30/20 1223)   feeding supplement (GLUCERNA 1.2 CAL) 1,000 mL (09/30/20 1142)   lactated ringers 75 mL/hr at 10/01/20 0539     LOS: 2 days    Time spent: 35 minutes.     Alba Cory, MD [facility]   If 7PM-7AM, please contact night-coverage [email or web address]  10/01/2020, 10:24 AM

## 2020-10-01 NOTE — Consult Note (Signed)
Palliative Medicine Inpatient Consult Note  Consulting Provider: Karmen Bongo, MD  Reason for consult:   [place] Palliative Medicine Consult  Reason for Consult? goals of care   HPI:  Per intake H&P --> Hannah Fischer is a 87 y.o. 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  She was reported to have cough at her facility and was noted to have hypoxia despite [place] O2 this AM.  Palliative care met with Hannah Fischer family extensively in February 2022 at that time there was tremendous hope for improvement. We reviewed then that if improvements did not occur we should consider a more comfort oriented path of care. Present Hannah Fischer in re-hospitalized for a UTI causing an AKI. She was also noted to be hypoxic which is a chronic issue since her stroke.   Palliative care has been asked to see Hannah Fischer family during this hospitalization to further address goals of care given patients overall poor quality of life and declining health Fischer.  Clinical Assessment/Goals of Care:  *Please note that this is a verbal dictation therefore any spelling or grammatical errors are due to the "[place] One" system interpretation.  I have reviewed medical records including EPIC notes, labs and imaging, received report from bedside RN, assessed the patient who is lying in bed with her tongue out, non-responsive.    I met with Hannah Fischer, & Hannah Fischer to further discuss diagnosis prognosis, GOC, EOL wishes, disposition and options.  A brief review of Hannah Fischer's past medical history was had. We reviewed her h/o DM, PE, CVA, and hypothyroidism. We focused much of our conversation on the patients prior CVA which has left her in a chronically debilitated Fischer of non-amblulatory and G-tube  dependant.   Patients family shares that some days she is able to moan "mhmm" and per her daughter, Hannah Fischer she at times did communicate more with the staff at [facility].    I introduced Palliative Medicine as specialized medical care for people living with serious illness. It focuses on providing relief from the symptoms and stress of a serious illness. The goal is to improve quality of life for both the patient and the family.  Hannah Fischer shares with me that his wife is from [place], [place]. They have been married for over sixty-five years. They had two children, a daughter Hannah Fischer who lives in [place] and a son, Hannah Fischer who lives in [place]. Hannah Fischer use to work for [employer] and later retired from [employer]. Hannah Fischer got enjoyment out of bowling and traveling locally. 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  Prior to hospitalization, [name] had been living at [facility] and full dependent.   A detailed discussion was had today regarding advanced directives - we have a copy of these in [place].    Concepts specific to code status, artifical feeding and hydration, continued IV antibiotics and rehospitalization was had.  Prior MOST reviewed, verified DNR/DNI code status.   A very honest conversation was held in regards to Hannah Fischer. We reviewed that Hannah Fischer has a very significant pressure injury on her bottom which is unlikely to cure. We reviewed that she has remained in her bed for months with very little interaction. We reviewed that as a result of this she has severe contractures. We discussed  that artificial nutrition while maintaining life does not in anyway improve her quality of life.  We review prior conversations which were to allow a few weeks to see if improvements would be made and if not to transition our focus. Patients husband shares that she has been told Hannah Fischer can make a recovery. I shared with him that she  will not recovery ever to the point of being the person she was prior. I asked him to take a long hard look at the reality of Hannah Fischer living in a bed dependent on others for everything. I asked if her believes this is the quality of like she would wish to have? Hannah Fischer was tearful stating that this has been very hard on him as she had been his best friend for the past 35 years. His daughter shares that it is difficult for him to let her go because of this. We lamented on the beautiful years they have shared together.  I asked Hannah Fischer if he had considered hospice care,  I described hospice as a service for patients for have a life expectancy of < 6 months. It preserves dignity and quality at the end phases of life. The focus changes from curative to symptom relief. His son, Hannah Fischer shares that he has had four family members on hospice and they "don't treat them". From his perception they only treat pain. We reviewed that I cannot change his own beliefs about something though I would consider what is in Hannah Fischer's best interest.   Hannah Fischer endorses that he "needs more time".   We discussed the plan for transition to [facility]. I shared with Hannah Fischer I worry we will keep seeing Hannah Fischer and continue to decline in the oncoming weeks/months. He again shared that he "needs more time".   Discussed the importance of continued conversation with family and their  medical providers regarding overall plan of care and treatment options, ensuring decisions are within the context of the patients values and GOCs.  Decision Maker: Hannah Fischer (Spouse) [phone]  SUMMARY OF RECOMMENDATIONS   DNAR/DNI  Advance care planning documents in [place]  Patients spouse, Hannah Fischer not ready to make any decisions regarding cessation of care  Information provided on hospice though patients son had prior had unfavorable experiences with them  Ongoing incremental PMT support  Code Status/Advance  Care Planning: DNAR/DNI   Palliative Prophylaxis:  Oral Care, Turn Q2H, Wound Care  Additional Recommendations (Limitations, Scope, Preferences): Continue current scope of care   Psycho-social/Spiritual:  Desire for further Chaplaincy support: No Additional Recommendations: Education on chronic debility   Prognosis: Terminal frailty, hospice is more than appropriate < 6 months.  Discharge Planning: Patient will transition back to [facility].   Vitals:   09/30/20 2315 10/01/20 0318  BP: 135/88 132/78  Pulse: 98 (!) 104  Resp: 15 18  Temp: 98.4 F (36.9 C) 99.4 F (37.4 C)  SpO2: 99% 99%    Intake/Output Summary (Last 24 hours) at 10/01/2020 0655 Last data filed at 10/01/2020 0539 Gross per 24 hour  Intake 2279.49 ml  Output 300 ml  Net 1979.49 ml   Gen:  Elderly F chronically ill appearing F HEENT:Dry mucous membranes CV: Irregular rate and rhythm  PULM: 2.5LPM [place] ABD: G-Tube  EXT: No edema  Neuro:  Somnolent    PPS: 10%   This conversation/these recommendations were discussed with patient primary care team, Dr. Tyrell Antonio  Time In: 1600 Time Out: 1715 Total Time: 44 Greater than  50%  of this time was spent counseling and coordinating care related to the above assessment and plan.  [place] Team Team Cell Phone: [phone] Please utilize secure chat with additional questions, if there is no response within 30 minutes please call the above phone number  Palliative Medicine Team providers are available by phone from 7am to 7pm daily and can be reached through the team cell phone.  Should this patient require assistance outside of these hours, please call the patient's attending physician.

## 2020-10-01 NOTE — Progress Notes (Signed)
Pharmacy Antibiotic Note  Hannah Fischer is a 87 y.o. female admitted on 09/29/2020 with UTI.  Pharmacy has been consulted for Unasyn dosing. Patient with past medical history significant for CVA with contractures, nonverbal, unable to follow commands, chronic pain syndrome, diabetes, hypothyroidism, PE, chronic UPJ obstruction with a history of percutaneous nephrostomy tube and solitary kidney presents with shortness of breath.  She was also noted to be hypoxic despite nasal cannula oxygen the morning of admission.  Patient is chronically on 2 L of oxygen. Patient was started on Ceftriaxone, but urine cultures resulted with resistant E. Faecalis. Patient is afebrile, WBC wnl, and Scr improving to 1.41 with fluids. Current estimated CrCl approximately 37.   Plan: Unasyn 1.5 G IV Q8H  Monitor renal function closely for dose adjustment needs  Monitor clinica status and duration of therapy      Temp (24hrs), Avg:98.7 F (37.1 C), Min:97.8 F (36.6 C), Max:99.4 F (37.4 C)  Recent Labs  Lab 09/29/20 0545 09/30/20 0222 10/01/20 0052  WBC 8.9 11.5* 10.0  CREATININE 1.87* 1.73* 1.41*    CrCl cannot be calculated (Unknown ideal weight.).    Antimicrobials this admission: 8/27 CTX >> 8/28 8/28 Unasyn >>   Dose adjustments this admission: N/A  Microbiology results: 8/26 UCx: E. Faecalis MDR  Thank you for allowing pharmacy to be a part of this patient's care.  Isaias Sakai, PharmD, MBA Pharmacy Resident [phone] 10/01/2020 12:18 PM

## 2020-10-02 LAB — GLUCOSE, CAPILLARY
Glucose-Capillary: 140 mg/dL — ABNORMAL HIGH (ref 70–99)
Glucose-Capillary: 156 mg/dL — ABNORMAL HIGH (ref 70–99)
Glucose-Capillary: 215 mg/dL — ABNORMAL HIGH (ref 70–99)
Glucose-Capillary: 137 mg/dL — ABNORMAL HIGH (ref 70–99)
Glucose-Capillary: 125 mg/dL — ABNORMAL HIGH (ref 70–99)

## 2020-10-02 LAB — CBC
HCT: 29.3 % — ABNORMAL LOW (ref 36.0–46.0)
Hemoglobin: 8.5 g/dL — ABNORMAL LOW (ref 12.0–15.0)
MCH: 28.6 pg (ref 26.0–34.0)
MCHC: 29 g/dL — ABNORMAL LOW (ref 30.0–36.0)
MCV: 98.7 fL (ref 80.0–100.0)
Platelets: 402 10*3/uL — ABNORMAL HIGH (ref 150–400)
RBC: 2.97 MIL/uL — ABNORMAL LOW (ref 3.87–5.11)
RDW: 14.6 % (ref 11.5–15.5)
WBC: 10.7 10*3/uL — ABNORMAL HIGH (ref 4.0–10.5)
nRBC: 0.2 % (ref 0.0–0.2)

## 2020-10-02 LAB — BASIC METABOLIC PANEL
Anion gap: 7 (ref 5–15)
BUN: 63 mg/dL — ABNORMAL HIGH (ref 8–23)
CO2: 30 mmol/L (ref 22–32)
Calcium: 8.4 mg/dL — ABNORMAL LOW (ref 8.9–10.3)
Chloride: 103 mmol/L (ref 98–111)
Creatinine, Ser: 1.51 mg/dL — ABNORMAL HIGH (ref 0.44–1.00)
GFR, Estimated: 34 mL/min — ABNORMAL LOW (ref >60)
Glucose, Bld: 189 mg/dL — ABNORMAL HIGH (ref 70–99)
Potassium: 4.7 mmol/L (ref 3.5–5.1)
Sodium: 140 mmol/L (ref 135–145)

## 2020-10-02 NOTE — Progress Notes (Signed)
Palliative Medicine Inpatient Follow Up Note  Consulting Provider: Karmen Bongo, MD   Reason for consult:   [facility] Consult  Reason for Consult? goals of care    HPI:  Per intake H&P --> Hannah Fischer is a 87 y.o. 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  She was reported to have cough at her facility and was noted to have hypoxia despite [place] O2 this AM.   Palliative care met with Hannah Fischer's family extensively in February 2022 at that time there was tremendous hope for improvement. We reviewed then that if improvements did not occur we should consider a more comfort oriented path of care. Present Hannah Fischer in re-hospitalized for a UTI causing an AKI. She was also noted to be hypoxic which is a chronic issue since her stroke.    Palliative care has been asked to see Hannah Fischer's family during this hospitalization to further address goals of care given patients overall poor quality of life and declining health state.  Today's Discussion (10/02/2020):  *Please note that this is a verbal dictation therefore any spelling or grammatical errors are due to the "[place] [employer]" system interpretation.  Chart reviewed.  I met this morning at bedside with Hannah Fischer who was tearful.He shares that he knows Hannah Fischer is not going to get better but he cannot bare the thought of losing her after 33 years of marriage. I offered him time to verbally work through these emotions. Hannah Fischer states that his daughter shared when Hannah Fischer was in a more lucid state she said, "I wish things could go back to how they were, but I know they cannot." He expresses that this is the first time he has heard this.  I expressed to [name] that delaying Hannah Fischer's passing is not going to make  any of his grief less. I shared that truly he should consider what is best for his wife.  I provided additional education on hospice care and shared that this could be administered at [facility] should he decide to pursue this. Hannah Fischer states that he "needs more time" and shares that he will call the PMT in the oncoming days with a decision.   Discussed the importance of continued conversation with family and their  medical providers regarding overall plan of care and treatment options, ensuring decisions are within the context of the patients values and GOCs.  Questions and concerns addressed   Objective Assessment: Vital Signs Vitals:   10/02/20 0321 10/02/20 0824  BP: 133/65 (!) 153/87  Pulse: 91 86  Resp: 17 18  Temp: 99.8 F (37.7 C) 100.3 F (37.9 C)  SpO2: 98% 99%    Intake/Output Summary (Last 24 hours) at 10/02/2020 0950 Last data filed at 10/02/2020 0516 Gross per 24 hour  Intake 2064.95 ml  Output 1250 ml  Net 814.95 ml   Gen:  Elderly F chronically ill appearing F HEENT:Dry mucous membranes, tongue out CV: Irregular rate and rhythm  PULM: 2.5LPM [name] ABD: G-Tube  EXT: No edema  Neuro:  Somnolent    SUMMARY OF RECOMMENDATIONS   DNAR/DNI   Advance care planning documents in [facility]   Patients spouse, Hannah Fischer not ready to make any decisions regarding cessation of care. He requests some more time to consider hospice. Emphasis placed on consideration of what is best  for Hannah Fischer at this juncture   Information provided on hospice though patients son had prior had unfavorable experiences with   [facility]   Ongoing incremental PMT support   Time Spent: 35 Greater than 50% of the time was spent in counseling and coordination of care ______________________________________________________________________________________ Hannah Fischer Team Team Cell Phone:  [phone] Please utilize secure chat with additional questions, if there is no response within 30 minutes please call the above phone number  Palliative Medicine Team providers are available by phone from 7am to 7pm daily and can be reached through the team cell phone.  Should this patient require assistance outside of these hours, please call the patient's attending physician.

## 2020-10-02 NOTE — Progress Notes (Signed)
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  She was also noted to be hypoxic despite nasal cannula oxygen the morning of admission.  Patient is chronically on 2 L of oxygen.    Assessment & Plan:   Principal Problem:   AKI (acute kidney injury) (HCC) Active Problems:   HTN (hypertension)   Diabetes mellitus (HCC)   Hypothyroidism   Dyslipidemia   Stroke (HCC)   DNR (do not resuscitate)   1-AKI; In the setting of UTI, prerenal increased metabolic needs. Plan to continue with IV fluids. -Creatinine peak 1.8 -Creatinine down to 1.7.---1.4 -Repeat labs today.   2-UTI: Patient with more than 50 white blood cells on UA.   Urine culture growing: E coli and enterococcus, sensitive to Unasyn.  -Continue with to Unasyn. Day 2./  -Could do Augmentin at discharge.  -Continue with IV antibiotics due to low grade fever.   History of CVA: Patient with significant deficits on exam. -On tube feeding for nutrition.  -Continue with Plavix, Crestor, Provigil, Robaxin and Oxy IR -Palliative care consult for goals of care. Family is not ready for hospice care.   Diabetes: Continue with Levemir and sliding scale insulin Cbg low range, change levemir to daily  Hypertension: Continue with Norvasc and hydralazine  Hypothyroidism: Continue with Synthroid  Hypoxia; Chronic Hypoxic respiratory failure.  -suspect worsening hypoxemia related to hypoventilation.  Chest x-ray on admission showed low lung volumes without right radiographic evidence of acute cardiopulmonary disease. -Repeated chest x-ray to exclude pneumonia  showed: Probably skinfold create central line in the periphery of the left lower lung.  Attention of subsequent chest x-ray to exclude less likely possibility of pneumothorax.  Low inspiratory volume with left bibasilar atelectasis. Currently on 2 L of oxygen. X ray negative for Pneumothorax.    Anemia: Iron Deficiency: Plan to start iron supplements when infection sources  Goals of care: Patient appears to have very limited quality of life.  Palliative care consulted for conversation  in regards to goals of care. Family is not ready for hospice.   See wound documentation below:   Pressure Injury 09/29/20 Sacrum Medial (Active)  09/29/20 2030  Location: Sacrum  Location Orientation: Medial  Staging:   Wound Description (Comments):   Present on Admission:      Nutrition Problem: Increased nutrient needs Etiology: wound healing    Signs/Symptoms: estimated needs    Interventions: Tube feeding  Estimated body mass index is 31 kg/m as calculated from the following:   Height as of this encounter: 5\' 3"  (1.6 m).   Weight as of this encounter: 79.4 kg.   DVT prophylaxis: Lovenox Code Status: DNR Family Communication: Care discussed with husband who was at bedside 8/29 Disposition Plan:  Status is: Inpatient  Remains inpatient appropriate because:IV treatments appropriate due to intensity of illness or inability to take PO  Dispo: The patient is from: SNF              Anticipated d/c is to: SNF              Patient currently is not medically stable to d/c.   Difficult to place patient No  Consultants:  none  Procedures:  none  Antimicrobials:    Subjective: Non verbal.  She would say ''mhmm' weak voice to husband when he ask questions.  Explained to patient's husband, that Hannah Fischer has already poor cognition and mental status at  baseline.  Every new hospitalization or acute illness her mentation is going to get worse, and  difficult  to  improved.  Objective: Vitals:   10/02/20 0321 10/02/20 0824 10/02/20 1027 10/02/20 1210  BP: 133/65 (!) 153/87 (!) 156/65 (!) 142/61  Pulse: 91 86  89  Resp: 17 18  18   Temp: 99.8 F (37.7 C) 100.3 F (37.9 C)  100.1 F (37.8 C)  TempSrc: Oral Oral  Axillary  SpO2: 98% 99%  99%  Weight:   79.4 kg   Height:   5\' 3"  (1.6 m)     Intake/Output Summary (Last 24 hours) at 10/02/2020 1303 Last data filed at 10/02/2020 0516 Gross per 24 hour  Intake 2064.95 ml  Output 1250 ml  Net 814.95 ml    Filed Weights   10/02/20 1027  Weight: 79.4 kg    Examination:  General exam: Sleepy, non verbal.  Respiratory system: Decreased breath sounds Cardiovascular system: S 1, S 2 RRR Gastrointestinal system: BS present, soft, nt, peg tube in place.  Central nervous system: lethargic, contraction upper extremities.  Extremities: no edema    Data Reviewed: I have personally reviewed following labs and imaging studies  CBC: Recent Labs  Lab 09/29/20 0545 09/30/20 0222 10/01/20 0052  WBC 8.9 11.5* 10.0  NEUTROABS 6.0  --   --   HGB 8.5* 8.1* 8.5*  HCT 28.9* 26.3* 27.4*  MCV 99.3 95.6 96.5  PLT 320 334 354    Basic Metabolic Panel: Recent Labs  Lab 09/29/20 0545 09/30/20 0222 10/01/20 0052  NA 133* 136 139  K 4.8 4.6 4.3  CL 93* 98 100  CO2 29 29 29   GLUCOSE 218* 295* 76  BUN 80* 78* 77*  CREATININE 1.87* 1.73* 1.41*  CALCIUM 9.0 9.1 9.1    GFR: Estimated Creatinine Clearance: 29.1 mL/min (A) (by C-G formula based on SCr of 1.41 mg/dL (H)). Liver Function Tests: Recent Labs  Lab 09/29/20 0545  AST 26  ALT 21  ALKPHOS 95  BILITOT 0.7  PROT 6.2*  ALBUMIN 2.3*    No results for input(s): LIPASE, AMYLASE in the last 168 hours. No results for input(s): AMMONIA in the last 168 hours. Coagulation Profile: No results for input(s): INR, PROTIME in the last 168 hours. Cardiac Enzymes: No results for input(s): CKTOTAL, CKMB, CKMBINDEX, TROPONINI in the last 168  hours. BNP (last 3 results) No results for input(s): PROBNP in the last 8760 hours. HbA1C: Recent Labs    10/01/20 0052  HGBA1C 6.4*   CBG: Recent Labs  Lab 10/01/20 1654 10/01/20 1922 10/02/20 0547 10/02/20 0821 10/02/20 1216  GLUCAP 116* 110* 140* 156* 215*    Lipid Profile: No results for input(s): CHOL, HDL, LDLCALC, TRIG, CHOLHDL, LDLDIRECT in the last 72 hours. Thyroid Function Tests: No results for input(s): TSH, T4TOTAL, FREET4, T3FREE, THYROIDAB in the last 72 hours. Anemia Panel: No results for input(s): VITAMINB12, FOLATE, FERRITIN, TIBC, IRON, RETICCTPCT in the last 72 hours.  Sepsis Labs: No results for input(s): PROCALCITON, LATICACIDVEN in the last 168 hours.  Recent Results (from the past 240 hour(s))  Resp Panel by RT-PCR (Flu A&B, Covid) Nasopharyngeal Swab     Status: None   Collection Time: 09/29/20  7:39 AM  Specimen: Nasopharyngeal Swab; Nasopharyngeal(NP) swabs in vial transport medium  Result Value Ref Range Status   SARS Coronavirus 2 by RT PCR NEGATIVE NEGATIVE Final    Comment: (NOTE) SARS-CoV-2 target nucleic acids are NOT DETECTED.  The SARS-CoV-2 RNA is generally detectable in upper respiratory specimens during the acute phase of infection. The lowest concentration of SARS-CoV-2 viral copies this assay can detect is 138 copies/mL. A negative result does not preclude SARS-Cov-2 infection and should not be used as the sole basis for treatment or other patient management decisions. A negative result may occur with  improper specimen collection/handling, submission of specimen other than nasopharyngeal swab, presence of viral mutation(s) within the areas targeted by this assay, and inadequate number of viral copies(<138 copies/mL). A negative result must be combined with clinical observations, patient history, and epidemiological information. The expected result is Negative.  Fact Sheet for Patients:   BloggerCourse.com  Fact Sheet for Healthcare Providers:  SeriousBroker.it  This test is no t yet approved or cleared by the [place] FDA and  has been authorized for detection and/or diagnosis of SARS-CoV-2 by FDA under an Emergency Use Authorization (EUA). This EUA will remain  in effect (meaning this test can be used) for the duration of the COVID-19 declaration under Section 564(b)(1) of the Act, 21 U.S.C.section 360bbb-3(b)(1), unless the authorization is terminated  or revoked sooner.       Influenza A by PCR NEGATIVE NEGATIVE Final   Influenza B by PCR NEGATIVE NEGATIVE Final    Comment: (NOTE) The Xpert Xpress SARS-CoV-2/FLU/RSV plus assay is intended as an aid in the diagnosis of influenza from Nasopharyngeal swab specimens and should not be used as a sole basis for treatment. Nasal washings and aspirates are unacceptable for Xpert Xpress SARS-CoV-2/FLU/RSV testing.  Fact Sheet for Patients: BloggerCourse.com  Fact Sheet for Healthcare Providers: SeriousBroker.it  This test is not yet approved or cleared by the [place] FDA and has been authorized for detection and/or diagnosis of SARS-CoV-2 by FDA under an Emergency Use Authorization (EUA). This EUA will remain in effect (meaning this test can be used) for the duration of the COVID-19 declaration under Section 564(b)(1) of the Act, 21 U.S.C. section 360bbb-3(b)(1), unless the authorization is terminated or revoked.  Performed at [facility], [address], [place], [place] [address]   Urine Culture     Status: Abnormal   Collection Time: 09/29/20  9:34 AM   Specimen: Urine, Catheterized  Result Value Ref Range Status   Specimen Description URINE, CATHETERIZED  Final   Special Requests   Final    NONE Performed at [facility], [address], [place], [place] [address]    Culture (A)   Final    >=100,000 COLONIES/mL ESCHERICHIA COLI Confirmed Extended Spectrum Beta-Lactamase Producer (ESBL).  In bloodstream infections from ESBL organisms, carbapenems are preferred over piperacillin/tazobactam. They are shown to have a lower risk of mortality. 60,000 COLONIES/mL ENTEROCOCCUS FAECALIS    Report Status 10/01/2020 FINAL  Final   Organism ID, Bacteria ESCHERICHIA COLI (A)  Final   Organism ID, Bacteria ENTEROCOCCUS FAECALIS (A)  Final      Susceptibility   Escherichia coli - MIC*    AMPICILLIN >=32 RESISTANT Resistant     CEFAZOLIN >=64 RESISTANT Resistant     CEFEPIME 4 INTERMEDIATE Intermediate     CEFTRIAXONE >=64 RESISTANT Resistant     CIPROFLOXACIN >=4 RESISTANT Resistant     GENTAMICIN <=1 SENSITIVE Sensitive     IMIPENEM <=  0.25 SENSITIVE Sensitive     NITROFURANTOIN <=16 SENSITIVE Sensitive     TRIMETH/SULFA >=320 RESISTANT Resistant     AMPICILLIN/SULBACTAM 8 SENSITIVE Sensitive     PIP/TAZO <=4 SENSITIVE Sensitive     * >=100,000 COLONIES/mL ESCHERICHIA COLI   Enterococcus faecalis - MIC*    AMPICILLIN <=2 SENSITIVE Sensitive     NITROFURANTOIN <=16 SENSITIVE Sensitive     VANCOMYCIN 1 SENSITIVE Sensitive     * 60,000 COLONIES/mL ENTEROCOCCUS FAECALIS          Radiology Studies: DG CHEST PORT 1 VIEW  Result Date: 10/01/2020 CLINICAL DATA:  Shortness of breath. EXAM: PORTABLE CHEST 1 VIEW COMPARISON:  September 30, 2020. FINDINGS: The heart size and mediastinal contours are within normal limits. Right lung is clear. Minimal left basilar subsegmental atelectasis is noted with small left pleural effusion. Status post bilateral shoulder arthroplasties. Stable elevated right hemidiaphragm. IMPRESSION: Minimal left basilar subsegmental atelectasis with small left pleural effusion. Aortic Atherosclerosis (ICD10-I70.0). Electronically Signed   By: Lupita Raider M.D.   On: 10/01/2020 09:19        Scheduled Meds:  acetaminophen  650 mg Per NG tube Q6H    amLODipine  10 mg Per Tube Daily   Chlorhexidine Gluconate Cloth  6 each Topical Daily   clopidogrel  75 mg Per Tube Daily   docusate  100 mg Per Tube BID   enoxaparin (LOVENOX) injection  30 mg Subcutaneous Q24H   feeding supplement (PROSource TF)  45 mL Per Tube BID   ferrous sulfate  325 mg Oral Q breakfast   hydrALAZINE  10 mg Per Tube TID   insulin aspart  0-6 Units Subcutaneous TID WC   insulin detemir  11 Units Subcutaneous Daily   levothyroxine  112 mcg Per Tube QAC breakfast   methocarbamol  750 mg Per Tube QID   modafinil  200 mg Per Tube Daily   oxyCODONE  5 mg Per Tube Q6H   rosuvastatin  2.5 mg Per Tube q1800   Continuous Infusions:  ampicillin-sulbactam (UNASYN) IV 1.5 g (10/02/20 1234)   feeding supplement (GLUCERNA 1.2 CAL) 1,000 mL (10/02/20 1252)   lactated ringers 75 mL/hr at 10/02/20 0516     LOS: 3 days    Time spent: 35 minutes.     Alba Cory, MD [facility]   If 7PM-7AM, please contact night-coverage [email or web address]  10/02/2020, 1:03 PM

## 2020-10-03 LAB — BASIC METABOLIC PANEL
Anion gap: 14 (ref 5–15)
BUN: 61 mg/dL — ABNORMAL HIGH (ref 8–23)
CO2: 25 mmol/L (ref 22–32)
Calcium: 8.5 mg/dL — ABNORMAL LOW (ref 8.9–10.3)
Chloride: 102 mmol/L (ref 98–111)
Creatinine, Ser: 1.46 mg/dL — ABNORMAL HIGH (ref 0.44–1.00)
GFR, Estimated: 35 mL/min — ABNORMAL LOW (ref >60)
Glucose, Bld: 163 mg/dL — ABNORMAL HIGH (ref 70–99)
Potassium: 4.8 mmol/L (ref 3.5–5.1)
Sodium: 141 mmol/L (ref 135–145)

## 2020-10-03 LAB — CBC
HCT: 25.7 % — ABNORMAL LOW (ref 36.0–46.0)
Hemoglobin: 7.7 g/dL — ABNORMAL LOW (ref 12.0–15.0)
MCH: 29.8 pg (ref 26.0–34.0)
MCHC: 30 g/dL (ref 30.0–36.0)
MCV: 99.6 fL (ref 80.0–100.0)
Platelets: 355 10*3/uL (ref 150–400)
RBC: 2.58 MIL/uL — ABNORMAL LOW (ref 3.87–5.11)
RDW: 14.6 % (ref 11.5–15.5)
WBC: 10.6 10*3/uL — ABNORMAL HIGH (ref 4.0–10.5)
nRBC: 0 % (ref 0.0–0.2)

## 2020-10-03 LAB — GLUCOSE, CAPILLARY
Glucose-Capillary: 180 mg/dL — ABNORMAL HIGH (ref 70–99)
Glucose-Capillary: 228 mg/dL — ABNORMAL HIGH (ref 70–99)
Glucose-Capillary: 174 mg/dL — ABNORMAL HIGH (ref 70–99)
Glucose-Capillary: 110 mg/dL — ABNORMAL HIGH (ref 70–99)

## 2020-10-03 LAB — RESP PANEL BY RT-PCR (FLU A&B, COVID) ARPGX2
Influenza A by PCR: NEGATIVE
Influenza B by PCR: NEGATIVE
SARS Coronavirus 2 by RT PCR: NEGATIVE

## 2020-10-03 MED ORDER — INSULIN DETEMIR 100 UNIT/ML ~~LOC~~ SOLN: 5.0000 [IU] | Freq: Every day | SUBCUTANEOUS | 11 refills | Status: DC

## 2020-10-03 MED ORDER — FREE WATER: 200.0000 mL | 0 refills | Status: AC

## 2020-10-03 MED ORDER — FREE WATER
200.0000 mL | Freq: Four times a day (QID) | Status: DC
  Administered 2020-10-03 (×2): 200 mL

## 2020-10-03 MED ORDER — INSULIN DETEMIR 100 UNIT/ML ~~LOC~~ SOLN: 11.0000 [IU] | Freq: Every day | SUBCUTANEOUS | 11 refills | Status: AC

## 2020-10-03 MED ORDER — AMOXICILLIN-POT CLAVULANATE 875-125 MG PO TABS: 1.0000 | ORAL_TABLET | Freq: Two times a day (BID) | ORAL | 0 refills | Status: AC

## 2020-10-03 MED ORDER — SODIUM CHLORIDE 0.9 % IV SOLN
250.0000 mg | Freq: Once | INTRAVENOUS | Status: AC
  Administered 2020-10-03: 250 mg via INTRAVENOUS
  Filled 2020-10-03: qty 20

## 2020-10-03 MED ORDER — OXYCODONE HCL 5 MG PO TABS
5.0000 mg | ORAL_TABLET | Freq: Once | ORAL | Status: AC
  Administered 2020-10-03: 5 mg via ORAL
  Filled 2020-10-03: qty 1

## 2020-10-03 MED ORDER — FLEET ENEMA 7-19 GM/118ML RE ENEM
1.0000 | ENEMA | Freq: Once | RECTAL | Status: AC
  Administered 2020-10-03: 1 via RECTAL
  Filled 2020-10-03: qty 1

## 2020-10-03 NOTE — Progress Notes (Signed)
Dressing change--moist to dry.  Minimal yellow-ish green and sanguinous drainage.  Foul odor.  Pt tolerated well.

## 2020-10-03 NOTE — Progress Notes (Signed)
Attempted report to [facility] but extension line unavailable.

## 2020-10-03 NOTE — Progress Notes (Signed)
Called [facility] to give report. First time on hold and then it hung up.  Called back and took name and number for nurse to call back after her med pass.

## 2020-10-03 NOTE — Progress Notes (Addendum)
OT Cancellation Note  Patient Details Name: Hannah Fischer MRN: [id number] DOB: 1932-04-18   Cancelled Treatment:    Reason Eval/Treat Not Completed: OT screened, no needs identified, will sign off (Pt plan to return to SNF today. No acute needs. Defer further OT needs to next venue of care. Will sign off. Thank you.)  Anaija Wissink M Sharese Manrique Shyler Holzman MSOT, OTR/L Acute Rehab Pager: [phone] Office: [phone] 10/03/2020, 12:35 PM

## 2020-10-03 NOTE — Consult Note (Signed)
   [facility] Inpatient Consult   10/03/2020  SHAAKIRA BORRERO [birth year] [id number]  [facility] [[facility]] Patient: [facility]  Reviewed for extreme high risk noted for transition of care disposition today. Patient is for a skilled nursing facility level of care.  Plan: No current [facility] Care Management follow up as patient transition of care needs are to be met at the skilled nursing facility level of care for post hospital.  Natividad Brood, RN BSN [facility] Liaison  [phone] business mobile phone Toll free office [phone]  Fax number: [phone] [email or web address]

## 2020-10-03 NOTE — Discharge Summary (Addendum)
Physician Discharge Summary  Hannah Fischer ZOX:[id number] DOB: Oct 12, 1932 DOA: 09/29/2020  PCP: Sigmund Hazel, MD  Admit date: 09/29/2020 Discharge date: 10/03/2020  Admitted From: SNF Disposition:  SNF  Recommendations for Outpatient Follow-up:  Follow up with PCP in 1-2 weeks Please obtain BMP/CBC in one week Recommend Palliative care follow up at SNF for continuation of goals of care.    Discharge Condition: Stable. High risk for readmission due to Comorbidity.  CODE STATUS: DNR Diet recommendation: NPO, on tube feeding.   Brief/Interim Summary: 87 year old with past medical history significant for CVA with contractures, nonverbal, unable to follow commands, chronic pain syndrome, diabetes, hypothyroidism, PE, chronic UPJ obstruction with a history of percutaneous nephrostomy tube and solitary kidney presents with shortness of breath.  She was also noted to be hypoxic despite nasal cannula oxygen the morning of admission.  Patient is chronically on 2 L of oxygen.  1-AKI; In the setting of UTI, prerenal increased metabolic needs. Plan to continue with IV fluids. -Creatinine peak 1.8 -Creatinine down to 1.7.---1.4 Stable at 1.4.  Change free water to every 4 hours.     2-UTI: in setting of chronic foley catheter.  Patient with more than 50 white blood cells on UA.   Urine culture growing: E coli and enterococcus, sensitive to Unasyn.  -Continue with to Unasyn. Day 2./  -Could do Augmentin at discharge.  -Afebrile. WBC stable at 10. Plan to transition to Augmentin for 7 days.    History of CVA: Patient with significant deficits on exam. -On tube feeding for nutrition.  -Continue with Plavix, Crestor, Provigil, Robaxin and Oxy IR -Palliative care consult for goals of care. Family is not ready for hospice care.  -Poor prognosis.   Diabetes: Continue with Levemir and sliding scale insulin Cbg low range, change levemir to daily Levemir reduce to 11 units daily.     Hypertension: Continue with Norvasc and hydralazine   Hypothyroidism: Continue with Synthroid   Hypoxia; Chronic Hypoxic respiratory failure.  -suspect worsening hypoxemia related to hypoventilation.  Chest x-ray on admission showed low lung volumes without right radiographic evidence of acute cardiopulmonary disease. -Repeated chest x-ray to exclude pneumonia showed: Probably skinfold create central line in the periphery of the left lower lung.  Attention of subsequent chest x-ray to exclude less likely possibility of pneumothorax.  Low inspiratory volume with left bibasilar atelectasis. Currently on 2 L of oxygen. X ray negative for Pneumothorax.      Anemia: Iron Deficiency: Will proceed with IV iron one time dose.  Hb decreased to 7.7, could be hemodilution form IV fluids.  No evidence of active bleeding.  Follow CBC at SNF Constipation; will proceed with Fleet enema.  Chronic pain:  Continue with oxycodone.  Patient having more pain this afternoon after enema, wound dressing changes. She is not due for pain med. Discussed with husband will proceed with extra dose of oxycodone to help with pain, he understand risk of sedation, respiratory suppression.   Goals of care: Patient appears to have very limited quality of life.  Palliative care consulted for conversation  in regards to goals of care. Family is not ready for hospice.  Recommended Palliative care follow up at SNF>   See wound documentation below:    Pressure Injury 09/29/20 Sacrum Medial (Active)  09/29/20 2030  Location: Sacrum  Location Orientation: Medial  Staging:   Wound Description (Comments):   Present on Admission:         Nutrition Problem: Increased nutrient needs Etiology: wound  healing      Discharge Diagnoses:  Principal Problem:   AKI (acute kidney injury) (HCC) Active Problems:   HTN (hypertension)   Diabetes mellitus (HCC)   Hypothyroidism   Dyslipidemia   Stroke [facility](HCC)   DNR (do not  resuscitate)    Discharge Instructions  Discharge Instructions     Discharge wound care:   Complete by: As directed    See above   Increase activity slowly   Complete by: As directed       Allergies as of 10/03/2020       Reactions   Atorvastatin Other (See Comments)   "Aches"   Ezetimibe-simvastatin Other (See Comments)   "Aches"   Lovastatin Other (See Comments)   Weakness   Metformin Hcl Diarrhea   Pravastatin Other (See Comments)   Weakness   Codeine Anxiety, Other (See Comments)   Keeps the patient awake   Simvastatin Nausea Only, Other (See Comments)   Muscle weakness and an stomach upset   Sulfa Antibiotics Other (See Comments)   Gastric intolerance and caused stomach upset   Sulfites Other (See Comments)   Abdominal pain        Medication List     TAKE these medications    acetaminophen 325 MG tablet Commonly known as: TYLENOL 2 tablets (650 mg total) by Per NG tube route every 6 (six) hours.   amLODipine 5 MG tablet Commonly known as: NORVASC Place 1 tablet (5 mg total) into feeding tube daily. What changed: how much to take   amoxicillin-clavulanate 875-125 MG tablet Commonly known as: Augmentin Take 1 tablet by mouth 2 (two) times daily for 7 days.   clopidogrel 75 MG tablet Commonly known as: PLAVIX Place 1 tablet (75 mg total) into feeding tube daily.   diclofenac Sodium 1 % Gel Commonly known as: VOLTAREN Apply 1 application topically in the morning, at noon, and at bedtime.   docusate 50 MG/5ML liquid Commonly known as: COLACE Place 10 mLs (100 mg total) into feeding tube 2 (two) times daily as needed for mild constipation.   feeding supplement (GLUCERNA 1.2 CAL) Liqd Place 1,000 mLs into feeding tube continuous.   feeding supplement (PROSource TF) liquid Place 45 mLs into feeding tube 3 (three) times daily.   feeding supplement (PRO-STAT SUGAR FREE 64) Liqd Take 30 mLs by mouth 3 (three) times daily with meals.   ferrous  sulfate 325 (65 FE) MG tablet Take 325 mg by mouth daily with breakfast.   free water Soln Place 200 mLs into feeding tube every 4 (four) hours. What changed: when to take this   HumaLOG KwikPen 100 UNIT/ML KwikPen Generic drug: insulin lispro Inject 0-9 Units into the skin every 6 (six) hours. Per sliding scale: BS-70-120= 0 units, 121-150=1 unit,151-200= 2 units,201-250=3 units,251-300=5 units, 301--350=7 units,351-400= 9 units, > 400 call MD   hydrALAZINE 10 MG tablet Commonly known as: APRESOLINE Take 10 mg by mouth 3 (three) times daily.   hydroxypropyl methylcellulose / hypromellose 2.5 % ophthalmic solution Commonly known as: ISOPTO TEARS / GONIOVISC Place 1 drop into both eyes 4 (four) times daily as needed for dry eyes.   insulin detemir 100 UNIT/ML injection Commonly known as: LEVEMIR Inject 0.11 mLs (11 Units total) into the skin daily. Start taking on: October 04, 2020 What changed: how much to take   levothyroxine 112 MCG tablet Commonly known as: SYNTHROID Place 1 tablet (112 mcg total) into feeding tube daily before breakfast.   liver oil-zinc oxide 40 %  ointment Commonly known as: DESITIN Apply topically 2 (two) times daily. What changed:  how much to take when to take this reasons to take this   methocarbamol 750 MG tablet Commonly known as: ROBAXIN Place 1 tablet (750 mg total) into feeding tube 4 (four) times daily.   modafinil 200 MG tablet Commonly known as: PROVIGIL Place 1 tablet (200 mg total) into feeding tube daily.   Nyamyc powder Generic drug: nystatin Apply 1 application topically in the morning, at noon, and at bedtime. Apply to right side of neck   oxyCODONE 5 MG immediate release tablet Commonly known as: Oxy IR/ROXICODONE Place 0.5 tablets (2.5 mg total) into feeding tube every 8 (eight) hours. What changed:  how much to take when to take this additional instructions   polyethylene glycol 17 g packet Commonly known as: MIRALAX  / GLYCOLAX Place 17 g into feeding tube daily as needed for moderate constipation.   rosuvastatin 5 MG tablet Commonly known as: CRESTOR Place 0.5 tablets (2.5 mg total) into feeding tube daily at 6 PM.   sodium hypochlorite 0.125 % Soln Commonly known as: DAKIN'S 1/4 STRENGTH Irrigate with 1 application as directed 2 (two) times daily. Use to cleanse PU  to coccyx               Discharge Care Instructions  (From admission, onward)           Start     Ordered   10/03/20 0000  Discharge wound care:       Comments: See above   10/03/20 1139            Allergies  Allergen Reactions   Atorvastatin Other (See Comments)    "Aches"   Ezetimibe-Simvastatin Other (See Comments)    "Aches"   Lovastatin Other (See Comments)    Weakness   Metformin Hcl Diarrhea   Pravastatin Other (See Comments)    Weakness   Codeine Anxiety and Other (See Comments)    Keeps the patient awake   Simvastatin Nausea Only and Other (See Comments)    Muscle weakness and an stomach upset   Sulfa Antibiotics Other (See Comments)    Gastric intolerance and caused stomach upset   Sulfites Other (See Comments)    Abdominal pain    Consultations: Palliative care    Procedures/Studies: DG CHEST PORT 1 VIEW  Result Date: 10/01/2020 CLINICAL DATA:  Shortness of breath. EXAM: PORTABLE CHEST 1 VIEW COMPARISON:  September 30, 2020. FINDINGS: The heart size and mediastinal contours are within normal limits. Right lung is clear. Minimal left basilar subsegmental atelectasis is noted with small left pleural effusion. Status post bilateral shoulder arthroplasties. Stable elevated right hemidiaphragm. IMPRESSION: Minimal left basilar subsegmental atelectasis with small left pleural effusion. Aortic Atherosclerosis (ICD10-I70.0). Electronically Signed   By: Lupita Raider M.D.   On: 10/01/2020 09:19   DG CHEST PORT 1 VIEW  Result Date: 09/30/2020 CLINICAL DATA:  87 year old female with hypoxemia and  shortness of breath EXAM: PORTABLE CHEST 1 VIEW COMPARISON:  Prior chest x-ray 09/29/2020 FINDINGS: Cardiac and mediastinal contours are unchanged. Caseous calcification of the mitral valve annulus. Atherosclerotic calcifications along the transverse aorta. Probable skin fold creates a line along the periphery of the left upper lobe. Left reverse shoulder arthroplasty. Right shoulder hemiarthroplasty. Low inspiratory volumes. Probable left basilar atelectasis. IMPRESSION: 1. Probable skin fold creates a line in the periphery of the left lung. Attention on subsequent chest x-rays to exclude less likely possibility of pneumothorax. 2.  Low inspiratory volumes with left basilar atelectasis. 3. Aortic atherosclerosis. Electronically Signed   By: Malachy Moan M.D.   On: 09/30/2020 12:43   DG Chest Portable 1 View  Result Date: 09/29/2020 CLINICAL DATA:  87 year old female with history of cough and wheezing. EXAM: PORTABLE CHEST 1 VIEW COMPARISON:  Chest x-ray 04/29/2020. FINDINGS: Lung volumes are low. Mild chronic scarring in the periphery of the left lung base. No consolidative airspace disease. No pleural effusions. No pneumothorax. No pulmonary nodule or mass noted. Pulmonary vasculature and the cardiomediastinal silhouette are within normal limits. Severe calcifications of the mitral annulus. Atherosclerotic calcifications in the thoracic aorta. Surgical clips project over the right upper quadrant of the abdomen, likely from prior cholecystectomy. Status post bilateral shoulder arthroplasty. IMPRESSION: 1. Low lung volumes without radiographic evidence of acute cardiopulmonary disease. 2. Aortic atherosclerosis. 3. Severe calcifications of the mitral annulus. Electronically Signed   By: Trudie Reed M.D.   On: 09/29/2020 06:30     Subjective: Non verbal, keep eyes close, not following command.  Per husband she was having shaking this am, needed her meds.  She has remain afebrile.   Discharge  Exam: Vitals:   10/03/20 0345 10/03/20 0826  BP: (!) 157/77 (!) 168/88  Pulse: 89 81  Resp: 18 19  Temp: 99.1 F (37.3 C) 98.2 F (36.8 C)  SpO2: 97% 97%     General: patient is non verbal, keep eyes close.  Cardiovascular: RRR, S1/S2 +, no rubs, no gallops Respiratory: CTA bilaterally, no wheezing, no rhonchi Abdominal: Soft, NT, ND, bowel sounds + Extremities: no edema, no cyanosis, contraction upper extremities.     The results of significant diagnostics from this hospitalization (including imaging, microbiology, ancillary and laboratory) are listed below for reference.     Microbiology: Recent Results (from the past 240 hour(s))  Resp Panel by RT-PCR (Flu A&B, Covid) Nasopharyngeal Swab     Status: None   Collection Time: 09/29/20  7:39 AM   Specimen: Nasopharyngeal Swab; Nasopharyngeal(NP) swabs in vial transport medium  Result Value Ref Range Status   SARS Coronavirus 2 by RT PCR NEGATIVE NEGATIVE Final    Comment: (NOTE) SARS-CoV-2 target nucleic acids are NOT DETECTED.  The SARS-CoV-2 RNA is generally detectable in upper respiratory specimens during the acute phase of infection. The lowest concentration of SARS-CoV-2 viral copies this assay can detect is 138 copies/mL. A negative result does not preclude SARS-Cov-2 infection and should not be used as the sole basis for treatment or other patient management decisions. A negative result may occur with  improper specimen collection/handling, submission of specimen other than nasopharyngeal swab, presence of viral mutation(s) within the areas targeted by this assay, and inadequate number of viral copies(<138 copies/mL). A negative result must be combined with clinical observations, patient history, and epidemiological information. The expected result is Negative.  Fact Sheet for Patients:  BloggerCourse.com  Fact Sheet for Healthcare Providers:   SeriousBroker.it  This test is no t yet approved or cleared by the [place] FDA and  has been authorized for detection and/or diagnosis of SARS-CoV-2 by FDA under an Emergency Use Authorization (EUA). This EUA will remain  in effect (meaning this test can be used) for the duration of the COVID-19 declaration under Section 564(b)(1) of the Act, 21 U.S.C.section 360bbb-3(b)(1), unless the authorization is terminated  or revoked sooner.       Influenza A by PCR NEGATIVE NEGATIVE Final   Influenza B by PCR NEGATIVE NEGATIVE Final    Comment: (  NOTE) The Xpert Xpress SARS-CoV-2/FLU/RSV plus assay is intended as an aid in the diagnosis of influenza from Nasopharyngeal swab specimens and should not be used as a sole basis for treatment. Nasal washings and aspirates are unacceptable for Xpert Xpress SARS-CoV-2/FLU/RSV testing.  Fact Sheet for Patients: BloggerCourse.com  Fact Sheet for Healthcare Providers: SeriousBroker.it  This test is not yet approved or cleared by the [place] FDA and has been authorized for detection and/or diagnosis of SARS-CoV-2 by FDA under an Emergency Use Authorization (EUA). This EUA will remain in effect (meaning this test can be used) for the duration of the COVID-19 declaration under Section 564(b)(1) of the Act, 21 U.S.C. section 360bbb-3(b)(1), unless the authorization is terminated or revoked.  Performed at [facility], [address], [place], [place] [address]   Urine Culture     Status: Abnormal   Collection Time: 09/29/20  9:34 AM   Specimen: Urine, Catheterized  Result Value Ref Range Status   Specimen Description URINE, CATHETERIZED  Final   Special Requests   Final    NONE Performed at [facility], [address] [place], [place] [address]    Culture (A)  Final    >=100,000 COLONIES/mL ESCHERICHIA COLI Confirmed Extended Spectrum  Beta-Lactamase Producer (ESBL).  In bloodstream infections from ESBL organisms, carbapenems are preferred over piperacillin/tazobactam. They are shown to have a lower risk of mortality. 60,000 COLONIES/mL ENTEROCOCCUS FAECALIS    Report Status 10/01/2020 FINAL  Final   Organism ID, Bacteria ESCHERICHIA COLI (A)  Final   Organism ID, Bacteria ENTEROCOCCUS FAECALIS (A)  Final      Susceptibility   Escherichia coli - MIC*    AMPICILLIN >=32 RESISTANT Resistant     CEFAZOLIN >=64 RESISTANT Resistant     CEFEPIME 4 INTERMEDIATE Intermediate     CEFTRIAXONE >=64 RESISTANT Resistant     CIPROFLOXACIN >=4 RESISTANT Resistant     GENTAMICIN <=1 SENSITIVE Sensitive     IMIPENEM <=0.25 SENSITIVE Sensitive     NITROFURANTOIN <=16 SENSITIVE Sensitive     TRIMETH/SULFA >=320 RESISTANT Resistant     AMPICILLIN/SULBACTAM 8 SENSITIVE Sensitive     PIP/TAZO <=4 SENSITIVE Sensitive     * >=100,000 COLONIES/mL ESCHERICHIA COLI   Enterococcus faecalis - MIC*    AMPICILLIN <=2 SENSITIVE Sensitive     NITROFURANTOIN <=16 SENSITIVE Sensitive     VANCOMYCIN 1 SENSITIVE Sensitive     * 60,000 COLONIES/mL ENTEROCOCCUS FAECALIS     Labs: BNP (last 3 results) No results for input(s): BNP in the last 8760 hours. Basic Metabolic Panel: Recent Labs  Lab 09/29/20 0545 09/30/20 0222 10/01/20 0052 10/02/20 1340 10/03/20 0121  NA 133* 136 139 140 141  K 4.8 4.6 4.3 4.7 4.8  CL 93* 98 100 103 102  CO2 29 29 29 30 25   GLUCOSE 218* 295* 76 189* 163*  BUN 80* 78* 77* 63* 61*  CREATININE 1.87* 1.73* 1.41* 1.51* 1.46*  CALCIUM 9.0 9.1 9.1 8.4* 8.5*   Liver Function Tests: Recent Labs  Lab 09/29/20 0545  AST 26  ALT 21  ALKPHOS 95  BILITOT 0.7  PROT 6.2*  ALBUMIN 2.3*   No results for input(s): LIPASE, AMYLASE in the last 168 hours. No results for input(s): AMMONIA in the last 168 hours. CBC: Recent Labs  Lab 09/29/20 0545 09/30/20 0222 10/01/20 0052 10/02/20 1340 10/03/20 0121  WBC 8.9  11.5* 10.0 10.7* 10.6*  NEUTROABS 6.0  --   --   --   --  HGB 8.5* 8.1* 8.5* 8.5* 7.7*  HCT 28.9* 26.3* 27.4* 29.3* 25.7*  MCV 99.3 95.6 96.5 98.7 99.6  PLT 320 334 354 402* 355   Cardiac Enzymes: No results for input(s): CKTOTAL, CKMB, CKMBINDEX, TROPONINI in the last 168 hours. BNP: Invalid input(s): POCBNP CBG: Recent Labs  Lab 10/02/20 0821 10/02/20 1216 10/02/20 1651 10/02/20 2047 10/03/20 0825  GLUCAP 156* 215* 137* 125* 180*   D-Dimer No results for input(s): DDIMER in the last 72 hours. Hgb A1c Recent Labs    10/01/20 0052  HGBA1C 6.4*   Lipid Profile No results for input(s): CHOL, HDL, LDLCALC, TRIG, CHOLHDL, LDLDIRECT in the last 72 hours. Thyroid function studies No results for input(s): TSH, T4TOTAL, T3FREE, THYROIDAB in the last 72 hours.  Invalid input(s): FREET3 Anemia work up No results for input(s): VITAMINB12, FOLATE, FERRITIN, TIBC, IRON, RETICCTPCT in the last 72 hours. Urinalysis    Component Value Date/Time   COLORURINE YELLOW 09/29/2020 0934   APPEARANCEUR HAZY (A) 09/29/2020 0934   LABSPEC 1.009 09/29/2020 0934   PHURINE 7.0 09/29/2020 0934   GLUCOSEU >=500 (A) 09/29/2020 0934   HGBUR SMALL (A) 09/29/2020 0934   BILIRUBINUR NEGATIVE 09/29/2020 0934   KETONESUR NEGATIVE 09/29/2020 0934   PROTEINUR NEGATIVE 09/29/2020 0934   UROBILINOGEN 0.2 11/22/2013 0945   NITRITE NEGATIVE 09/29/2020 0934   LEUKOCYTESUR LARGE (A) 09/29/2020 0934   Sepsis Labs Invalid input(s): PROCALCITONIN,  WBC,  LACTICIDVEN Microbiology Recent Results (from the past 240 hour(s))  Resp Panel by RT-PCR (Flu A&B, Covid) Nasopharyngeal Swab     Status: None   Collection Time: 09/29/20  7:39 AM   Specimen: Nasopharyngeal Swab; Nasopharyngeal(NP) swabs in vial transport medium  Result Value Ref Range Status   SARS Coronavirus 2 by RT PCR NEGATIVE NEGATIVE Final    Comment: (NOTE) SARS-CoV-2 target nucleic acids are NOT DETECTED.  The SARS-CoV-2 RNA is generally  detectable in upper respiratory specimens during the acute phase of infection. The lowest concentration of SARS-CoV-2 viral copies this assay can detect is 138 copies/mL. A negative result does not preclude SARS-Cov-2 infection and should not be used as the sole basis for treatment or other patient management decisions. A negative result may occur with  improper specimen collection/handling, submission of specimen other than nasopharyngeal swab, presence of viral mutation(s) within the areas targeted by this assay, and inadequate number of viral copies(<138 copies/mL). A negative result must be combined with clinical observations, patient history, and epidemiological information. The expected result is Negative.  Fact Sheet for Patients:  BloggerCourse.com  Fact Sheet for Healthcare Providers:  SeriousBroker.it  This test is no t yet approved or cleared by the [place] FDA and  has been authorized for detection and/or diagnosis of SARS-CoV-2 by FDA under an Emergency Use Authorization (EUA). This EUA will remain  in effect (meaning this test can be used) for the duration of the COVID-19 declaration under Section 564(b)(1) of the Act, 21 U.S.C.section 360bbb-3(b)(1), unless the authorization is terminated  or revoked sooner.       Influenza A by PCR NEGATIVE NEGATIVE Final   Influenza B by PCR NEGATIVE NEGATIVE Final    Comment: (NOTE) The Xpert Xpress SARS-CoV-2/FLU/RSV plus assay is intended as an aid in the diagnosis of influenza from Nasopharyngeal swab specimens and should not be used as a sole basis for treatment. Nasal washings and aspirates are unacceptable for Xpert Xpress SARS-CoV-2/FLU/RSV testing.  Fact Sheet for Patients: BloggerCourse.com  Fact Sheet for Healthcare Providers: SeriousBroker.it  This test  is not yet approved or cleared by the [place]  and has been authorized for detection and/or diagnosis of SARS-CoV-2 by FDA under an Emergency Use Authorization (EUA). This EUA will remain in effect (meaning this test can be used) for the duration of the COVID-19 declaration under Section 564(b)(1) of the Act, 21 U.S.C. section 360bbb-3(b)(1), unless the authorization is terminated or revoked.  Performed at [facility], [address] [place], [place] [address]   Urine Culture     Status: Abnormal   Collection Time: 09/29/20  9:34 AM   Specimen: Urine, Catheterized  Result Value Ref Range Status   Specimen Description URINE, CATHETERIZED  Final   Special Requests   Final    NONE Performed at [facility], [address] [place], [place] [address]    Culture (A)  Final    >=100,000 COLONIES/mL ESCHERICHIA COLI Confirmed Extended Spectrum Beta-Lactamase Producer (ESBL).  In bloodstream infections from ESBL organisms, carbapenems are preferred over piperacillin/tazobactam. They are shown to have a lower risk of mortality. 60,000 COLONIES/mL ENTEROCOCCUS FAECALIS    Report Status 10/01/2020 FINAL  Final   Organism ID, Bacteria ESCHERICHIA COLI (A)  Final   Organism ID, Bacteria ENTEROCOCCUS FAECALIS (A)  Final      Susceptibility   Escherichia coli - MIC*    AMPICILLIN >=32 RESISTANT Resistant     CEFAZOLIN >=64 RESISTANT Resistant     CEFEPIME 4 INTERMEDIATE Intermediate     CEFTRIAXONE >=64 RESISTANT Resistant     CIPROFLOXACIN >=4 RESISTANT Resistant     GENTAMICIN <=1 SENSITIVE Sensitive     IMIPENEM <=0.25 SENSITIVE Sensitive     NITROFURANTOIN <=16 SENSITIVE Sensitive     TRIMETH/SULFA >=320 RESISTANT Resistant     AMPICILLIN/SULBACTAM 8 SENSITIVE Sensitive     PIP/TAZO <=4 SENSITIVE Sensitive     * >=100,000 COLONIES/mL ESCHERICHIA COLI   Enterococcus faecalis - MIC*    AMPICILLIN <=2 SENSITIVE Sensitive     NITROFURANTOIN <=16 SENSITIVE Sensitive     VANCOMYCIN 1 SENSITIVE Sensitive     * 60,000  COLONIES/mL ENTEROCOCCUS FAECALIS     Time coordinating discharge: 40 minutes  SIGNED:   Alba Cory, MD  [facility]

## 2020-10-03 NOTE — Progress Notes (Signed)
PT Cancellation Note  Patient Details Name: Hannah Fischer MRN: [id number] DOB: 1932-10-10   Cancelled Treatment:    Reason Eval/Treat Not Completed: Other (comment) per chart, looks to be leaving for SNF today. Discussed with CSW who confirms no therapy notes needed for return to SNF. Thank you for the opportunity to participate in her care!   Madelaine Etienne, DPT, PN2   Supplemental Physical Therapist [facility]    Pager [phone] Acute Rehab Office [phone]

## 2020-10-03 NOTE — [place] FL2 (Signed)
[place] MEDICAID FL2 LEVEL OF CARE SCREENING TOOL     IDENTIFICATION  Patient Name: Hannah Fischer Birthdate: Jun 19, 1932 Sex: female Admission Date (Current Location): 09/29/2020  [facility] and [place] Number:  Producer, television/film/video and Address:  [facility] [place], [address], [place], [place] [address]      Provider Number: [id number]  Attending Physician Name and Address:  Alba Cory, MD  Relative Name and Phone Number:       Current Level of Care: Hospital Recommended Level of Care: Skilled Nursing Facility Prior Approval Number:    Date Approved/Denied:   PASRR Number: [id number]  Discharge Plan: Other (Comment) (LTC facility)    Current Diagnoses: Patient Active Problem List   Diagnosis Date Noted   Chronic right Ureterovesical junction (UVJ) obstruction 05/01/2020   Acute lower UTI 04/20/2020   Palliative care by specialist    Goals of care, counseling/discussion    DNR (do not resuscitate)    Acute bilateral thalamic infarction (HCC) 04/12/2020   Dysphagia 04/12/2020   CKD (chronic kidney disease), symptom management only, stage 3 (moderate) (HCC) 04/12/2020   Hydronephrosis 04/12/2020   Group B streptococcal UTI 04/12/2020   Uncontrolled type 2 diabetes mellitus with hyperglycemia, with long-term current use of insulin (HCC) 04/12/2020   Chronic diastolic heart failure (HCC) 04/12/2020   Essential hypertension 04/12/2020   Physical debility 04/12/2020   Left hemiplegia (HCC) 04/12/2020   History of embolic stroke 03/22/2020   Acute encephalopathy 03/18/2020   Gait abnormality 01/19/2020   Chest pressure 01/19/2020   Cerebral amyloid angiopathy (CODE)    Stroke (HCC) 11/24/2019   AKI (acute kidney injury) (HCC) 11/24/2019   Spinal stenosis of lumbar region with neurogenic claudication 10/26/2019   Abnormality of gait 08/02/2019   Cerebrovascular accident (CVA) (HCC) 07/22/2019   Dizziness 06/15/2019   Brain aneurysm  06/15/2019   Chronic pain syndrome 04/29/2019   Chronic right-sided low back pain without sciatica 04/12/2019   Arthritis of shoulder region, left, degenerative 12/03/2013   Nonspecific abnormal electrocardiogram (ECG) (EKG) 10/15/2013   Other and unspecified angina pectoris 10/15/2013   TIA (transient ischemic attack) 11/15/2011   HTN (hypertension) 11/15/2011   Diabetes mellitus (HCC) 11/15/2011   Hypothyroidism 11/15/2011   Dyslipidemia 11/15/2011   GERD (gastroesophageal reflux disease) 11/15/2011   Pyuria 11/15/2011    Orientation RESPIRATION BLADDER Height & Weight     Self, Time, Situation, Place  O2 (2.5L) Incontinent Weight: 175 lb (79.4 kg) (From March 2022 but weights have been very stable) Height:  5\' 3"  (160 cm) (per chart)  BEHAVIORAL SYMPTOMS/MOOD NEUROLOGICAL BOWEL NUTRITION STATUS      Incontinent    AMBULATORY STATUS COMMUNICATION OF NEEDS Skin   Total Care Verbally Normal                       Personal Care Assistance Level of Assistance  Bathing, Feeding, Dressing, Total care Bathing Assistance: Maximum assistance Feeding assistance: Maximum assistance Dressing Assistance: Maximum assistance Total Care Assistance: Maximum assistance   Functional Limitations Info             SPECIAL CARE FACTORS FREQUENCY                       Contractures Contractures Info: Not present    Additional Factors Info  Code Status, Allergies Code Status Info: DNR Allergies Info: Atorvastatin   Ezetimibe-simvastatin   Lovastatin   Metformin Hcl   Pravastatin  Codeine   Simvastatin   Sulfa Antibiotics   Sulfites           Current Medications (10/03/2020):  This is the current hospital active medication list Current Facility-Administered Medications  Medication Dose Route Frequency Provider Last Rate Last Admin   acetaminophen (TYLENOL) tablet 650 mg  650 mg Per NG tube Q6H Jonah Blue, MD   650 mg at 10/03/20 1058   amLODipine (NORVASC) tablet 10  mg  10 mg Per Tube Daily Jonah Blue, MD   10 mg at 10/03/20 1057   ampicillin-sulbactam (UNASYN) 1.5 g in sodium chloride 0.9 % 100 mL IVPB  1.5 g Intravenous Q8H Ike Bene, RPH 200 mL/hr at 10/03/20 0500 1.5 g at 10/03/20 0500   bisacodyl (DULCOLAX) suppository 10 mg  10 mg Rectal Daily PRN Jonah Blue, MD       Chlorhexidine Gluconate Cloth 2 % PADS 6 each  6 each Topical Daily Jonah Blue, MD   6 each at 10/03/20 1101   clopidogrel (PLAVIX) tablet 75 mg  75 mg Per Tube Daily Jonah Blue, MD   75 mg at 10/03/20 1057   docusate (COLACE) 50 MG/5ML liquid 100 mg  100 mg Per Tube BID Jonah Blue, MD   100 mg at 10/03/20 1056   enoxaparin (LOVENOX) injection 30 mg  30 mg Subcutaneous Q24H Jonah Blue, MD   30 mg at 10/03/20 1058   feeding supplement (GLUCERNA 1.2 CAL) liquid 1,000 mL  1,000 mL Per Tube Continuous Regalado, Belkys A, MD 55 mL/hr at 10/03/20 1131 1,000 mL at 10/03/20 1131   feeding supplement (PROSource TF) liquid 45 mL  45 mL Per Tube BID Regalado, Belkys A, MD   45 mL at 10/03/20 1056   ferric gluconate (FERRLECIT) 250 mg in sodium chloride 0.9 % 250 mL IVPB  250 mg Intravenous Once Regalado, Belkys A, MD       ferrous sulfate tablet 325 mg  325 mg Oral Q breakfast Jonah Blue, MD   325 mg at 10/03/20 1057   free water 200 mL  200 mL Per Tube Q6H Regalado, Belkys A, MD       hydrALAZINE (APRESOLINE) injection 5 mg  5 mg Intravenous Q4H PRN Jonah Blue, MD       hydrALAZINE (APRESOLINE) tablet 10 mg  10 mg Per Tube TID Jonah Blue, MD   10 mg at 10/03/20 1058   hydroxypropyl methylcellulose / hypromellose (ISOPTO TEARS / GONIOVISC) 2.5 % ophthalmic solution 1 drop  1 drop Both Eyes QID PRN Jonah Blue, MD       insulin aspart (novoLOG) injection 0-6 Units  0-6 Units Subcutaneous TID WC Regalado, Belkys A, MD   1 Units at 10/03/20 1059   insulin detemir (LEVEMIR) injection 11 Units  11 Units Subcutaneous Daily Regalado, Belkys A, MD   11  Units at 10/03/20 1059   lactated ringers infusion   Intravenous Continuous Regalado, Belkys A, MD 100 mL/hr at 10/03/20 0643 Infusion Verify at 10/03/20 4742   levothyroxine (SYNTHROID) tablet 112 mcg  112 mcg Per Tube QAC breakfast Jonah Blue, MD   112 mcg at 10/03/20 1057   methocarbamol (ROBAXIN) tablet 750 mg  750 mg Per Tube QID Jonah Blue, MD   750 mg at 10/03/20 1057   modafinil (PROVIGIL) tablet 200 mg  200 mg Per Tube Daily Jonah Blue, MD   200 mg at 10/03/20 1057   ondansetron (ZOFRAN) tablet 4 mg  4 mg Oral Q6H PRN Jonah Blue,  MD       Or   ondansetron (ZOFRAN) injection 4 mg  4 mg Intravenous Q6H PRN Jonah Blue, MD       oxyCODONE (Oxy IR/ROXICODONE) immediate release tablet 5 mg  5 mg Per Tube Lambert Mody, MD   5 mg at 10/03/20 1057   polyethylene glycol (MIRALAX / GLYCOLAX) packet 17 g  17 g Per Tube Daily PRN Jonah Blue, MD   17 g at 10/03/20 0345   rosuvastatin (CRESTOR) tablet 2.5 mg  2.5 mg Per Tube q1800 Jonah Blue, MD   2.5 mg at 10/02/20 1714   sodium phosphate (FLEET) 7-19 GM/118ML enema 1 enema  1 enema Rectal Once Regalado, Belkys A, MD         Discharge Medications: Please see discharge summary for a list of discharge medications.  Relevant Imaging Results:  Relevant Lab Results:   Additional Information SS#: [id number]  Jimmy Picket, LCSW

## 2020-10-03 NOTE — TOC Transition Note (Signed)
Transition of Care [facility]) - CM/SW Discharge Note   Patient Details  Name: Hannah Fischer MRN: [id number] Date of Birth: 12-17-1932  Transition of Care [facility]) CM/SW Contact:  Jimmy Picket, LCSW Phone Number: 10/03/2020, 12:41 PM   Clinical Narrative:     Patient will DC to: [facility] Anticipated DC date: 10/03/20 Family notified: Spouse Transport by: Sharin Mons     Per MD patient ready for DC to [facility]. RN, patient, patient's family, and facility notified of DC. Discharge Summary and FL2 sent to facility. DC packet on chart. Ambulance transport requested for patient.    RN to call report to [phone].  CSW will sign off for now as social work intervention is no longer needed. Please consult [place] again if new needs arise.   Final next level of care: Long Term Nursing Home Barriers to Discharge: No Barriers Identified   Patient Goals and CMS Choice Patient states their goals for this hospitalization and ongoing recovery are:: return to LTC facility      Discharge Placement              Patient [facility] bed at: [facility] Patient to be transferred to facility by: [facility] Name of family member notified: Spouse Patient and family notified of of transfer: 10/03/20  Discharge Plan and Services                                     Social Determinants of Health (SDOH) Interventions     Readmission Risk Interventions Readmission Risk Prevention Plan 04/13/2020  Transportation Screening Complete  Medication Review [facility]) Complete  PCP or Specialist appointment within 3-5 days of discharge Complete  HRI or Home Care Consult Complete  SW Recovery Care/Counseling Consult Complete  Palliative Care Screening Complete  Skilled Nursing Facility Complete  Some recent data might be hidden    Jimmy Picket, [place] Clinical Social Worker [phone]

## 2020-10-04 NOTE — Progress Notes (Signed)
Called report to nurse Mirian Capuchin at [facility].

## 2020-10-04 NOTE — Progress Notes (Signed)
[facility] transported patient to SNF in stable condition.

## 2020-10-05 DIAGNOSIS — I10 Essential (primary) hypertension: Secondary | ICD-10-CM | POA: Diagnosis not present

## 2020-10-07 ENCOUNTER — Encounter (HOSPITAL_COMMUNITY): Payer: Self-pay | Admitting: Emergency Medicine

## 2020-10-07 ENCOUNTER — Emergency Department (HOSPITAL_COMMUNITY): Payer: Medicare Other

## 2020-10-07 ENCOUNTER — Emergency Department (HOSPITAL_COMMUNITY)
Admission: EM | Admit: 2020-10-07 | Discharge: 2020-10-07 | Disposition: A | Discharge status: Home / Self Care | Payer: Medicare Other | Attending: Emergency Medicine | Admitting: Emergency Medicine

## 2020-10-07 ENCOUNTER — Other Ambulatory Visit: Payer: Self-pay

## 2020-10-07 DIAGNOSIS — E1140 Type 2 diabetes mellitus with diabetic neuropathy, unspecified: Secondary | ICD-10-CM | POA: Insufficient documentation

## 2020-10-07 DIAGNOSIS — Z96612 Presence of left artificial shoulder joint: Secondary | ICD-10-CM | POA: Insufficient documentation

## 2020-10-07 DIAGNOSIS — I130 Hypertensive heart and chronic kidney disease with heart failure and stage 1 through stage 4 chronic kidney disease, or unspecified chronic kidney disease: Secondary | ICD-10-CM | POA: Diagnosis not present

## 2020-10-07 DIAGNOSIS — R231 Pallor: Secondary | ICD-10-CM | POA: Diagnosis not present

## 2020-10-07 DIAGNOSIS — E039 Hypothyroidism, unspecified: Secondary | ICD-10-CM | POA: Insufficient documentation

## 2020-10-07 DIAGNOSIS — Z96611 Presence of right artificial shoulder joint: Secondary | ICD-10-CM | POA: Diagnosis not present

## 2020-10-07 DIAGNOSIS — E1122 Type 2 diabetes mellitus with diabetic chronic kidney disease: Secondary | ICD-10-CM | POA: Insufficient documentation

## 2020-10-07 DIAGNOSIS — Z79899 Other long term (current) drug therapy: Secondary | ICD-10-CM | POA: Insufficient documentation

## 2020-10-07 DIAGNOSIS — Z794 Long term (current) use of insulin: Secondary | ICD-10-CM | POA: Diagnosis not present

## 2020-10-07 DIAGNOSIS — R0989 Other specified symptoms and signs involving the circulatory and respiratory systems: Secondary | ICD-10-CM | POA: Diagnosis not present

## 2020-10-07 DIAGNOSIS — Z96653 Presence of artificial knee joint, bilateral: Secondary | ICD-10-CM | POA: Diagnosis not present

## 2020-10-07 DIAGNOSIS — T17308A Unspecified foreign body in larynx causing other injury, initial encounter: Secondary | ICD-10-CM

## 2020-10-07 DIAGNOSIS — R404 Transient alteration of awareness: Secondary | ICD-10-CM | POA: Diagnosis not present

## 2020-10-07 DIAGNOSIS — R0602 Shortness of breath: Secondary | ICD-10-CM

## 2020-10-07 DIAGNOSIS — N183 Chronic kidney disease, stage 3 unspecified: Secondary | ICD-10-CM | POA: Diagnosis not present

## 2020-10-07 DIAGNOSIS — Z7902 Long term (current) use of antithrombotics/antiplatelets: Secondary | ICD-10-CM | POA: Insufficient documentation

## 2020-10-07 DIAGNOSIS — I10 Essential (primary) hypertension: Secondary | ICD-10-CM | POA: Diagnosis not present

## 2020-10-07 DIAGNOSIS — I5032 Chronic diastolic (congestive) heart failure: Secondary | ICD-10-CM | POA: Diagnosis not present

## 2020-10-07 DIAGNOSIS — J9811 Atelectasis: Secondary | ICD-10-CM | POA: Diagnosis not present

## 2020-10-07 DIAGNOSIS — I517 Cardiomegaly: Secondary | ICD-10-CM | POA: Diagnosis not present

## 2020-10-07 DIAGNOSIS — R059 Cough, unspecified: Secondary | ICD-10-CM | POA: Diagnosis present

## 2020-10-07 LAB — BASIC METABOLIC PANEL
Anion gap: 8 (ref 5–15)
BUN: 55 mg/dL — ABNORMAL HIGH (ref 8–23)
CO2: 30 mmol/L (ref 22–32)
Calcium: 8.5 mg/dL — ABNORMAL LOW (ref 8.9–10.3)
Chloride: 97 mmol/L — ABNORMAL LOW (ref 98–111)
Creatinine, Ser: 1.57 mg/dL — ABNORMAL HIGH (ref 0.44–1.00)
GFR, Estimated: 32 mL/min — ABNORMAL LOW (ref >60)
Glucose, Bld: 98 mg/dL (ref 70–99)
Potassium: 5.2 mmol/L — ABNORMAL HIGH (ref 3.5–5.1)
Sodium: 135 mmol/L (ref 135–145)

## 2020-10-07 LAB — CBC WITH DIFFERENTIAL/PLATELET
Abs Immature Granulocytes: 0 10*3/uL (ref 0.00–0.07)
Basophils Absolute: 0 10*3/uL (ref 0.0–0.1)
Basophils Relative: 0 %
Eosinophils Absolute: 0.2 10*3/uL (ref 0.0–0.5)
Eosinophils Relative: 2 %
HCT: 28.4 % — ABNORMAL LOW (ref 36.0–46.0)
Hemoglobin: 8.3 g/dL — ABNORMAL LOW (ref 12.0–15.0)
Lymphocytes Relative: 6 %
Lymphs Abs: 0.6 10*3/uL — ABNORMAL LOW (ref 0.7–4.0)
MCH: 29.3 pg (ref 26.0–34.0)
MCHC: 29.2 g/dL — ABNORMAL LOW (ref 30.0–36.0)
MCV: 100.4 fL — ABNORMAL HIGH (ref 80.0–100.0)
Monocytes Absolute: 0.4 10*3/uL (ref 0.1–1.0)
Monocytes Relative: 4 %
Neutro Abs: 9.3 10*3/uL — ABNORMAL HIGH (ref 1.7–7.7)
Neutrophils Relative %: 88 %
Platelets: 440 10*3/uL — ABNORMAL HIGH (ref 150–400)
RBC: 2.83 MIL/uL — ABNORMAL LOW (ref 3.87–5.11)
RDW: 15 % (ref 11.5–15.5)
WBC: 10.6 10*3/uL — ABNORMAL HIGH (ref 4.0–10.5)
nRBC: 0 % (ref 0.0–0.2)
nRBC: 0 /100 WBC

## 2020-10-07 NOTE — ED Notes (Signed)
Resting on stretcher continuing to wait on [facility].

## 2020-10-07 NOTE — ED Notes (Signed)
[facility] CALLED  °

## 2020-10-07 NOTE — ED Triage Notes (Signed)
Pt here by [facility] from [facility] for possible aspiration; lung sounds clear; pt is nonverbal and has a continuous feeding tube, pt has a prior stroke with left-sided deficits; pt on 3L O2 continuously

## 2020-10-07 NOTE — ED Notes (Signed)
Pt non-verbal; unable to sign MSE and unable to answer triage questions

## 2020-10-07 NOTE — Discharge Instructions (Addendum)
Return to the emergency department for difficulty breathing, high fever, or other new and concerning symptoms.  Continue medications as previously prescribed.

## 2020-10-07 NOTE — ED Provider Notes (Signed)
[facility] EMERGENCY DEPARTMENT Provider Note   CSN: [id number] Arrival date & time: 10/07/20  0335     History Chief Complaint  Patient presents with   Aspiration    Hannah Fischer is a 87 y.o. female.  Patient is an 87 year old female with past medical history of diabetes, hypertension, prior pulmonary embolism.  Patient in declining health and is a resident of a skilled nursing facility.  Patient with contractures of extremities.  She was sent here for evaluation of possible aspiration.  From what I am told, she had a coughing episode this evening and the concern was for aspiration.  Patient has no additional history secondary to being nonverbal.  The history is provided by the patient.      Past Medical History:  Diagnosis Date   Anemia    chronic   Anxiety    Arthritis    djd   Arthritis of shoulder region, left, degenerative 12/03/2013   Chronic pain syndrome 04/29/2019   Chronic right-sided low back pain without sciatica 04/12/2019   Complication of anesthesia    unable to breathe lying  flat on back   Diabetes mellitus    type 2  niddm x 25 yrs   Diverticular disease    DJD (degenerative joint disease)    lumbar and cervical   GERD (gastroesophageal reflux disease)    Hyperlipemia    Hypertension    on medication   Hypothyroidism    takes levoxyl   Irritable bowel syndrome    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   Neuropathy in diabetes [facility])    Other and unspecified angina pectoris    tests came back NOT heart related   Pulmonary embolism (HCC) 06/2010   bilateral   TIA (transient ischemic attack) 11/15/2011    Patient Active Problem List   Diagnosis Date Noted   Chronic right Ureterovesical junction (UVJ) obstruction 05/01/2020   Acute lower UTI 04/20/2020   Palliative care by specialist    Goals of care, counseling/discussion    DNR (do not resuscitate)    Acute bilateral thalamic infarction (HCC) 04/12/2020    Dysphagia 04/12/2020   CKD (chronic kidney disease), symptom management only, stage 3 (moderate) (HCC) 04/12/2020   Hydronephrosis 04/12/2020   Group B streptococcal UTI 04/12/2020   Uncontrolled type 2 diabetes mellitus with hyperglycemia, with long-term current use of insulin (HCC) 04/12/2020   Chronic diastolic heart failure (HCC) 04/12/2020   Essential hypertension 04/12/2020   Physical debility 04/12/2020   Left hemiplegia (HCC) 04/12/2020   History of embolic stroke 03/22/2020   Acute encephalopathy 03/18/2020   Gait abnormality 01/19/2020   Chest pressure 01/19/2020   Cerebral amyloid angiopathy (CODE)    Stroke (HCC) 11/24/2019   AKI (acute kidney injury) (HCC) 11/24/2019   Spinal stenosis of lumbar region with neurogenic claudication 10/26/2019   Abnormality of gait 08/02/2019   Cerebrovascular accident (CVA) (HCC) 07/22/2019   Dizziness 06/15/2019   Brain aneurysm 06/15/2019   Chronic pain syndrome 04/29/2019   Chronic right-sided low back pain without sciatica 04/12/2019   Arthritis of shoulder region, left, degenerative 12/03/2013   Nonspecific abnormal electrocardiogram (ECG) (EKG) 10/15/2013   Other and unspecified angina pectoris 10/15/2013   TIA (transient ischemic attack) 11/15/2011   HTN (hypertension) 11/15/2011   Diabetes mellitus (HCC) 11/15/2011   Hypothyroidism 11/15/2011   Dyslipidemia 11/15/2011   GERD (gastroesophageal reflux disease) 11/15/2011   Pyuria 11/15/2011    Past Surgical History:  Procedure  Laterality Date   BACK SURGERY     BLEPHAROPLASTY  2012   rt eye   CARDIAC CATHETERIZATION     had one 10/2013   CHOLECYSTECTOMY     EYE SURGERY  2007   cataract ext/ iol rt eye   IR GASTROSTOMY TUBE MOD SED  04/17/2020   IR NEPHROSTOGRAM RIGHT THRU EXISTING ACCESS  04/30/2020   IR NEPHROSTOMY PLACEMENT RIGHT  03/27/2020   IR US GUIDANCE  03/27/2020   IR US GUIDANCE  04/30/2020   JOINT REPLACEMENT  2001,2005   both knees   LEFT HEART  CATHETERIZATION WITH CORONARY ANGIOGRAM N/A 10/18/2013   Procedure: LEFT HEART CATHETERIZATION WITH CORONARY ANGIOGRAM;  Surgeon: Corky CraftsJayadeep S Varanasi, MD;  [facility];  Service: Cardiovascular;  Laterality: N/A;   LUMBAR DISC SURGERY  1990   REVERSE SHOULDER ARTHROPLASTY Left 12/03/2013   Procedure: LEFT SHOULDER REVERSE ARTHROPLASTY;  Surgeon: Verlee RossettiSteven R Norris, MD;  [facility];  Service: Orthopedics;  Laterality: Left;   SHOULDER HEMI-ARTHROPLASTY  12/25/2010   Procedure: SHOULDER HEMI-ARTHROPLASTY;  Surgeon: Cammy CopaGregory Scott Dean;  [facility];  Service: Orthopedics;  Laterality: Right;     OB History   No obstetric history on file.     Family History  Problem Relation Age of Onset   Diabetes Mother    Heart disease Mother    Heart disease Sister    Heart disease Brother     Social History   Tobacco Use   Smoking status: Never   Smokeless tobacco: Never  Substance Use Topics   Alcohol use: No   Drug use: No    Home Medications Prior to Admission medications   Medication Sig Start Date End Date Taking? Authorizing Provider  acetaminophen (TYLENOL) 325 MG tablet 2 tablets (650 mg total) by Per NG tube route every 6 (six) hours. 05/01/20  Yes Russella DarEllis, Allison L, NP  Amino Acids-Protein Hydrolys (FEEDING SUPPLEMENT, PRO-STAT SUGAR FREE 64,) LIQD Place 30 mLs into feeding tube 3 (three) times daily with meals.   Yes [provider]  amLODipine (NORVASC) 5 MG tablet Place 1 tablet (5 mg total) into feeding tube daily. 05/02/20  Yes Russella DarEllis, Allison L, NP  amoxicillin-clavulanate (AUGMENTIN) 875-125 MG tablet Take 1 tablet by mouth 2 (two) times daily for 7 days. Patient taking differently: Place 1 tablet into feeding tube See admin instructions. Bid x 7 days 10/03/20 10/10/20 Yes Regalado, Belkys A, MD  clopidogrel (PLAVIX) 75 MG tablet Place 1 tablet (75 mg total) into feeding tube daily. 05/02/20  Yes Russella DarEllis, Allison L, NP  diclofenac Sodium (VOLTAREN) 1 % GEL  Apply 1 application topically in the morning, at noon, and at bedtime. 08/09/20  Yes [provider]  docusate (COLACE) 50 MG/5ML liquid Place 10 mLs (100 mg total) into feeding tube 2 (two) times daily as needed for mild constipation. 05/01/20  Yes Russella DarEllis, Allison L, NP  ferrous sulfate 325 (65 FE) MG tablet 325 mg daily with breakfast. Given via gastric tube   Yes [provider]  HUMALOG KWIKPEN 100 UNIT/ML KwikPen Inject 0-9 Units into the skin every 6 (six) hours. Per sliding scale: BS-70-120= 0 units, 121-150=1 unit,151-200= 2 units,201-250=3 units,251-300=5 units, 301--350=7 units,351-400= 9 units, > 400 call MD 09/18/20  Yes [provider]  hydrALAZINE (APRESOLINE) 10 MG tablet Take 10 mg by mouth 3 (three) times daily. 08/10/20  Yes [provider]  hydroxypropyl methylcellulose / hypromellose (ISOPTO TEARS / GONIOVISC) 2.5 % ophthalmic solution Place 1 drop  into both eyes 4 (four) times daily as needed for dry eyes. 05/01/20  Yes Russella Dar, NP  insulin detemir (LEVEMIR) 100 UNIT/ML injection Inject 0.11 mLs (11 Units total) into the skin daily. 10/04/20  Yes Regalado, Belkys A, MD  levothyroxine (SYNTHROID) 112 MCG tablet Place 1 tablet (112 mcg total) into feeding tube daily before breakfast. 05/02/20  Yes Russella Dar, NP  liver oil-zinc oxide (DESITIN) 40 % ointment Apply topically 2 (two) times daily. Patient taking differently: Apply 1 application topically in the morning and at bedtime. 05/01/20  Yes Russella Dar, NP  methocarbamol (ROBAXIN) 750 MG tablet Place 1 tablet (750 mg total) into feeding tube 4 (four) times daily. Patient taking differently: Place 750 mg into feeding tube every 6 (six) hours. 05/01/20  Yes Russella Dar, NP  modafinil (PROVIGIL) 200 MG tablet Place 1 tablet (200 mg total) into feeding tube daily. 05/02/20  Yes Russella Dar, NP  Nutritional Supplements (FEEDING SUPPLEMENT, GLUCERNA 1.2 CAL,) LIQD Place 1,000 mLs into  feeding tube continuous. Patient taking differently: Place 60 mLs into feeding tube See admin instructions. 60 cc/ hr every shift 05/01/20  Yes Russella Dar, NP  Nutritional Supplements (FEEDING SUPPLEMENT, PROSOURCE TF,) liquid Place 45 mLs into feeding tube 3 (three) times daily. 05/01/20  Yes Russella Dar, NP  oxyCODONE (OXY IR/ROXICODONE) 5 MG immediate release tablet Place 0.5 tablets (2.5 mg total) into feeding tube every 8 (eight) hours. Patient taking differently: Place 5 mg into feeding tube every 6 (six) hours. Hold for lethargy 05/01/20  Yes Russella Dar, NP  polyethylene glycol powder (GLYCOLAX/MIRALAX) 17 GM/SCOOP powder Place 17 g into feeding tube daily as needed for mild constipation.   Yes [provider]  rosuvastatin (CRESTOR) 5 MG tablet Place 0.5 tablets (2.5 mg total) into feeding tube daily at 6 PM. 05/01/20  Yes Russella Dar, NP  sodium hypochlorite (DAKIN'S 1/4 STRENGTH) 0.125 % SOLN Irrigate with 1 application as directed 2 (two) times daily. Use to cleanse PU  to coccyx   Yes [provider]  Water For Irrigation, Sterile (FREE WATER) SOLN Place 200 mLs into feeding tube every 4 (four) hours. 10/03/20  Yes Regalado, Belkys A, MD  polyethylene glycol (MIRALAX / GLYCOLAX) 17 g packet Place 17 g into feeding tube daily as needed for moderate constipation. Patient not taking: Reported on 10/07/2020 05/01/20   Russella Dar, NP    Allergies    Atorvastatin, Ezetimibe-simvastatin, Lovastatin, Metformin hcl, Pravastatin, Codeine, Simvastatin, Sulfa antibiotics, and Sulfites  Review of Systems   Review of Systems  Unable to perform ROS: Patient nonverbal   Physical Exam Updated Vital Signs BP 133/71 (BP Location: Left Arm)   Pulse 87   Temp 98.8 F (37.1 C) (Axillary)   Resp 20   Ht 5\' 3"  (1.6 m)   Wt 79.4 kg   SpO2 100%   BMI 31.00 kg/m   Physical Exam Vitals and nursing note reviewed.  Constitutional:      Comments: Patient is a  chronically ill-appearing female in no acute distress.  She is resting comfortably.  HENT:     Head: Normocephalic.  Cardiovascular:     Rate and Rhythm: Normal rate and regular rhythm.  Pulmonary:     Effort: Pulmonary effort is normal. No respiratory distress.     Breath sounds: No stridor. No wheezing or rhonchi.  Abdominal:     General: Abdomen is flat. Bowel sounds are normal. There is  no distension.     Palpations: Abdomen is soft.     Tenderness: There is no abdominal tenderness.  Musculoskeletal:     Cervical back: Normal range of motion.  Skin:    General: Skin is warm and dry.    ED Results / Procedures / Treatments   Labs (all labs ordered are listed, but only abnormal results are displayed) Labs Reviewed - No data to display  EKG None  Radiology No results found.  Procedures Procedures   Medications Ordered in ED Medications - No data to display  ED Course  I have reviewed the triage vital signs and the nursing notes.  Pertinent labs & imaging results that were available during my care of the patient were reviewed by me and considered in my medical decision making (see chart for details).    MDM Rules/Calculators/A&P  Patient sent from her extended care facility for possible aspiration.  Patient's oxygen saturations are 100% and her lungs are clear.  Chest x-ray shows no evidence for aspiration.  Laboratory studies are consistent with baseline and with what was recorded on her previous admission.  I see no indication for further work-up and feels the patient can safely be discharged.  Final Clinical Impression(s) / ED Diagnoses Final diagnoses:  SOB (shortness of breath)    Rx / DC Orders ED Discharge Orders     None        Geoffery Lyons, MD 10/07/20 [phone]

## 2020-10-11 DIAGNOSIS — R140 Abdominal distension (gaseous): Secondary | ICD-10-CM | POA: Diagnosis not present

## 2020-10-11 DIAGNOSIS — D649 Anemia, unspecified: Secondary | ICD-10-CM | POA: Diagnosis not present

## 2020-10-11 DIAGNOSIS — R52 Pain, unspecified: Secondary | ICD-10-CM | POA: Diagnosis not present

## 2020-10-11 DIAGNOSIS — J9610 Chronic respiratory failure, unspecified whether with hypoxia or hypercapnia: Secondary | ICD-10-CM | POA: Diagnosis not present

## 2020-10-11 DIAGNOSIS — I10 Essential (primary) hypertension: Secondary | ICD-10-CM | POA: Diagnosis not present

## 2020-10-11 DIAGNOSIS — R6889 Other general symptoms and signs: Secondary | ICD-10-CM | POA: Diagnosis not present

## 2020-10-12 DIAGNOSIS — R109 Unspecified abdominal pain: Secondary | ICD-10-CM | POA: Diagnosis not present

## 2020-10-12 DIAGNOSIS — M533 Sacrococcygeal disorders, not elsewhere classified: Secondary | ICD-10-CM | POA: Diagnosis not present

## 2020-10-12 DIAGNOSIS — M109 Gout, unspecified: Secondary | ICD-10-CM | POA: Diagnosis not present

## 2020-10-12 DIAGNOSIS — R0602 Shortness of breath: Secondary | ICD-10-CM | POA: Diagnosis not present

## 2020-10-14 DIAGNOSIS — I10 Essential (primary) hypertension: Secondary | ICD-10-CM | POA: Diagnosis not present

## 2020-10-20 ENCOUNTER — Ambulatory Visit (INDEPENDENT_AMBULATORY_CARE_PROVIDER_SITE_OTHER): Payer: Medicare Other | Admitting: Neurology

## 2020-10-20 ENCOUNTER — Encounter: Payer: Self-pay | Admitting: Neurology

## 2020-10-20 ENCOUNTER — Other Ambulatory Visit (HOSPITAL_COMMUNITY): Payer: Self-pay

## 2020-10-20 ENCOUNTER — Other Ambulatory Visit: Payer: Self-pay

## 2020-10-20 VITALS — BP 136/67 | HR 84 | Ht 64.0 in | Wt 186.0 lb

## 2020-10-20 DIAGNOSIS — R252 Cramp and spasm: Secondary | ICD-10-CM | POA: Diagnosis not present

## 2020-10-20 DIAGNOSIS — I639 Cerebral infarction, unspecified: Secondary | ICD-10-CM | POA: Diagnosis not present

## 2020-10-20 MED ORDER — LEVETIRACETAM 100 MG/ML PO SOLN
250.0000 mg | Freq: Two times a day (BID) | ORAL | 11 refills | Status: AC
  Filled 2020-10-20: qty 473, 95d supply, fill #0

## 2020-10-20 NOTE — Progress Notes (Signed)
Chief Complaint  Patient presents with   Follow-up    Pt with husband, son and caregiver. She is currently residing at [facility]. Follow up from stroke in hospital back 03/2020. Son is asking about progress if any?      ASSESSMENT AND PLAN  Hannah Fischer is a 87 y.o. female  Bilateral thalamic stroke extending into the midbrain in February 2022,  Very poor recovery, status post PEG tube, Foley catheter,  Only withdraw to pain, posturing of bilateral upper and lower extremity,  Frequent upper extremity jerking movement, after discussed with patient's husband and her son, desire treatment, will add on Keppra liquid form 250 mg twice daily, for seizure versus myoclonic jerking  I had discussion with her husband and her son, she has minimal chance to regain any significant functional recovery, she is also at the risk for developing complications such as UTI, aspiration pneumonia, decubitus ulcer.   DIAGNOSTIC DATA (LABS, IMAGING, TESTING) - I reviewed patient records, labs, notes, testing and imaging myself where available.  MRI of brain on Mar 19 2020: 1. Punctate acute infarction in the left superior cerebellum adjacent to the superior cerebellar peduncle. Bilateral thalamic acute infarction extending back into the mid brain. This is consistent with artery of Percheron infarction. No hemorrhage or mass effect. 2. Old infarction at the left parietooccipital junction. 3. Chronic small-vessel ischemic changes elsewhere throughout the brain.  MEDICAL HISTORY:  Hannah Fischer is a 87 year old female, seen in request by her primary care physician Dr.   Sigmund Hazel, to follow-up on stroke, she is currently a nursing home resident, is accompanied by her husband, her son, nursing home staff at today's visit on October 20, 2020   I reviewed hospital record, She was admitted to hospital in February 2022 for acute bilateral thalamic stroke, extending to midbrain, possibly from  embolism to the top of the basilar, or involvement of the artery of Percheron which supplied both medial thalamus, she also had a history of left cerebellar stroke, left occipital stroke, she suffered significant neurological deficit, required tube feeding, Foley catheter,  Also has comorbidity of hydronephrosis, chronic kidney disease, frequent UTI, diabetes, hypothyroidism, also had a chronic UPJ obstruction, required percutaneous nephrostomy tube during hospitalization, she was discharged to nursing home, continue to decline,  Most recent hospital admission in August 2022, for acute renal failure in the setting of UTI, with chronic Foley, CODE STATUS was DNR,  PHYSICAL EXAM:   Vitals:   10/20/20 1022  BP: 136/67  Pulse: 84  Weight: 186 lb (84.4 kg)  Height: 5\' 4"  (1.626 m)   Not recorded     Body mass index is 31.93 kg/m.  PHYSICAL EXAMNIATION:  Gen: NAD, conversant, well nourised, well groomed                     Cardiovascular: Occasionally irregular rhythm, Pulmonary: Congested bilaterally  NEUROLOGICAL EXAM:  MENTAL STATUS: Speech/cognition: Not following command, only withdrawing to pain,  CRANIAL NERVES: CN II: Blinks to visual threat bilaterally, small pupil, reactive CN III, IV, VI: No spontaneous eye movement Tongue protruding out, facial looks symmetric,  MOTOR: Fixed to posturing of bilateral upper and lower extremity, bilateral upper extremity flexion, wrist flexion, finger flexion, extension of bilateral lower extremity  REFLEXES: Hyperreflexia SENSORY: Withdrawal to pain  COORDINATION: No spontaneous movement  REVIEW OF SYSTEMS:  Full 14 system review of systems performed and notable only for as above All other review of systems were negative.  ALLERGIES: Allergies  Allergen Reactions   Atorvastatin Other (See Comments)    "Aches"   Ezetimibe-Simvastatin Other (See Comments)    "Aches"   Lovastatin Other (See Comments)    Weakness    Metformin Hcl Diarrhea   Pravastatin Other (See Comments)    Weakness   Codeine Anxiety and Other (See Comments)    Keeps the patient awake   Simvastatin Nausea Only and Other (See Comments)    Muscle weakness and an stomach upset   Sulfa Antibiotics Other (See Comments)    Gastric intolerance and caused stomach upset   Sulfites Other (See Comments)    Abdominal pain    HOME MEDICATIONS: Current Outpatient Medications  Medication Sig Dispense Refill   acetaminophen (TYLENOL) 325 MG tablet 2 tablets (650 mg total) by Per NG tube route every 6 (six) hours.     Amino Acids-Protein Hydrolys (FEEDING SUPPLEMENT, PRO-STAT SUGAR FREE 64,) LIQD Place 30 mLs into feeding tube 3 (three) times daily with meals.     amLODipine (NORVASC) 5 MG tablet Place 1 tablet (5 mg total) into feeding tube daily.     clopidogrel (PLAVIX) 75 MG tablet Place 1 tablet (75 mg total) into feeding tube daily.     diclofenac Sodium (VOLTAREN) 1 % GEL Apply 1 application topically in the morning, at noon, and at bedtime.     docusate (COLACE) 50 MG/5ML liquid Place 10 mLs (100 mg total) into feeding tube 2 (two) times daily as needed for mild constipation. 100 mL 0   ferrous sulfate 325 (65 FE) MG tablet 325 mg daily with breakfast. Given via gastric tube     HUMALOG KWIKPEN 100 UNIT/ML KwikPen Inject 0-9 Units into the skin every 6 (six) hours. Per sliding scale: BS-70-120= 0 units, 121-150=1 unit,151-200= 2 units,201-250=3 units,251-300=5 units, 301--350=7 units,351-400= 9 units, > 400 call MD     hydrALAZINE (APRESOLINE) 10 MG tablet Take 10 mg by mouth 3 (three) times daily.     hydroxypropyl methylcellulose / hypromellose (ISOPTO TEARS / GONIOVISC) 2.5 % ophthalmic solution Place 1 drop into both eyes 4 (four) times daily as needed for dry eyes. 15 mL 12   insulin detemir (LEVEMIR) 100 UNIT/ML injection Inject 0.11 mLs (11 Units total) into the skin daily. (Patient taking differently: Inject 7 Units into the skin  daily.) 10 mL 11   levothyroxine (SYNTHROID) 112 MCG tablet Place 1 tablet (112 mcg total) into feeding tube daily before breakfast.     liver oil-zinc oxide (DESITIN) 40 % ointment Apply topically 2 (two) times daily. (Patient taking differently: Apply 1 application topically in the morning and at bedtime.) 56.7 g 0   methocarbamol (ROBAXIN) 750 MG tablet Place 1 tablet (750 mg total) into feeding tube 4 (four) times daily. (Patient taking differently: Place 750 mg into feeding tube every 6 (six) hours.) 60 tablet 0   modafinil (PROVIGIL) 200 MG tablet Place 1 tablet (200 mg total) into feeding tube daily. 30 tablet 0   Nutritional Supplements (FEEDING SUPPLEMENT, GLUCERNA 1.2 CAL,) LIQD Place 1,000 mLs into feeding tube continuous. (Patient taking differently: Place 60 mLs into feeding tube See admin instructions. 60 cc/ hr every shift)     Nutritional Supplements (FEEDING SUPPLEMENT, PROSOURCE TF,) liquid Place 45 mLs into feeding tube 3 (three) times daily.     oxyCODONE (OXY IR/ROXICODONE) 5 MG immediate release tablet Place 0.5 tablets (2.5 mg total) into feeding tube every 8 (eight) hours. (Patient taking differently: Place 5 mg into feeding tube every  6 (six) hours. Hold for lethargy) 30 tablet 0   polyethylene glycol (MIRALAX / GLYCOLAX) 17 g packet Place 17 g into feeding tube daily as needed for moderate constipation. 14 each 0   polyethylene glycol powder (GLYCOLAX/MIRALAX) 17 GM/SCOOP powder Place 17 g into feeding tube daily as needed for mild constipation.     rosuvastatin (CRESTOR) 5 MG tablet Place 0.5 tablets (2.5 mg total) into feeding tube daily at 6 PM.     sodium hypochlorite (DAKIN'S 1/4 STRENGTH) 0.125 % SOLN Irrigate with 1 application as directed 2 (two) times daily. Use to cleanse PU  to coccyx     Water For Irrigation, Sterile (FREE WATER) SOLN Place 200 mLs into feeding tube every 4 (four) hours. 500 mL 0   No current facility-administered medications for this visit.     PAST MEDICAL HISTORY: Past Medical History:  Diagnosis Date   Anemia    chronic   Anxiety    Arthritis    djd   Arthritis of shoulder region, left, degenerative 12/03/2013   Chronic pain syndrome 04/29/2019   Chronic right-sided low back pain without sciatica 04/12/2019   Complication of anesthesia    unable to breathe lying  flat on back   Diabetes mellitus    type 2  niddm x 25 yrs   Diverticular disease    DJD (degenerative joint disease)    lumbar and cervical   GERD (gastroesophageal reflux disease)    Hyperlipemia    Hypertension    on medication   Hypothyroidism    takes levoxyl   Irritable bowel syndrome    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   Neuropathy in diabetes [facility])    Other and unspecified angina pectoris    tests came back NOT heart related   Pulmonary embolism (HCC) 06/2010   bilateral   TIA (transient ischemic attack) 11/15/2011    PAST SURGICAL HISTORY: Past Surgical History:  Procedure Laterality Date   BACK SURGERY     BLEPHAROPLASTY  2012   rt eye   CARDIAC CATHETERIZATION     had one 10/2013   CHOLECYSTECTOMY     EYE SURGERY  2007   cataract ext/ iol rt eye   IR GASTROSTOMY TUBE MOD SED  04/17/2020   IR NEPHROSTOGRAM RIGHT THRU EXISTING ACCESS  04/30/2020   IR NEPHROSTOMY PLACEMENT RIGHT  03/27/2020   IR US GUIDANCE  03/27/2020   IR US GUIDANCE  04/30/2020   JOINT REPLACEMENT  2001,2005   both knees   LEFT HEART CATHETERIZATION WITH CORONARY ANGIOGRAM N/A 10/18/2013   Procedure: LEFT HEART CATHETERIZATION WITH CORONARY ANGIOGRAM;  Surgeon: Corky Crafts, MD;  Location: [facility] CATH LAB;  Service: Cardiovascular;  Laterality: N/A;   LUMBAR DISC SURGERY  1990   REVERSE SHOULDER ARTHROPLASTY Left 12/03/2013   Procedure: LEFT SHOULDER REVERSE ARTHROPLASTY;  Surgeon: Verlee Rossetti, MD;  Location: [facility] OR;  Service: Orthopedics;  Laterality: Left;   SHOULDER HEMI-ARTHROPLASTY  12/25/2010   Procedure: SHOULDER HEMI-ARTHROPLASTY;   Surgeon: Cammy Copa;  Location: [facility] OR;  Service: Orthopedics;  Laterality: Right;    FAMILY HISTORY: Family History  Problem Relation Age of Onset   Diabetes Mother    Heart disease Mother    Heart disease Sister    Heart disease Brother     SOCIAL HISTORY: Social History   Socioeconomic History   Marital status: Married    Spouse name: Not on file   Number of children: Not  on file   Years of education: Not on file   Highest education level: Not on file  Occupational History   Not on file  Tobacco Use   Smoking status: Never   Smokeless tobacco: Never  Substance and Sexual Activity   Alcohol use: No   Drug use: No   Sexual activity: Never    Birth control/protection: Post-menopausal  Other Topics Concern   Not on file  Social History Narrative   Not on file   Social Determinants of Health   Financial Resource Strain: Not on file  Food Insecurity: Not on file  Transportation Needs: Not on file  Physical Activity: Not on file  Stress: Not on file  Social Connections: Not on file  Intimate Partner Violence: Not on file      Levert Feinstein, M.D. Ph.D.  [facility] [address], [address] [place], [place] [address] Ph: [phone] Fax: [phone]  CC:  Sigmund Hazel, MD [address] [place],  [place] [address]  Sigmund Hazel, MD

## 2020-10-20 NOTE — Patient Instructions (Signed)
Meds ordered this encounter  Medications   levETIRAcetam (KEPPRA) 100 MG/ML solution    Sig: Take 2.5 mLs (250 mg total) by mouth 2 (two) times daily.    Dispense:  473 mL    Refill:  11

## 2020-10-24 DIAGNOSIS — D509 Iron deficiency anemia, unspecified: Secondary | ICD-10-CM | POA: Diagnosis not present

## 2020-10-24 DIAGNOSIS — I10 Essential (primary) hypertension: Secondary | ICD-10-CM | POA: Diagnosis not present

## 2020-10-24 DIAGNOSIS — E119 Type 2 diabetes mellitus without complications: Secondary | ICD-10-CM | POA: Diagnosis not present

## 2020-10-26 DIAGNOSIS — E119 Type 2 diabetes mellitus without complications: Secondary | ICD-10-CM | POA: Diagnosis not present

## 2020-10-27 ENCOUNTER — Other Ambulatory Visit (HOSPITAL_COMMUNITY): Payer: Self-pay | Admitting: Internal Medicine

## 2020-10-27 ENCOUNTER — Other Ambulatory Visit: Payer: Self-pay | Admitting: Internal Medicine

## 2020-10-27 DIAGNOSIS — M869 Osteomyelitis, unspecified: Secondary | ICD-10-CM

## 2020-10-27 DIAGNOSIS — D649 Anemia, unspecified: Secondary | ICD-10-CM | POA: Diagnosis not present

## 2020-10-27 DIAGNOSIS — I10 Essential (primary) hypertension: Secondary | ICD-10-CM | POA: Diagnosis not present

## 2020-10-27 DIAGNOSIS — E785 Hyperlipidemia, unspecified: Secondary | ICD-10-CM | POA: Diagnosis not present

## 2020-11-01 DIAGNOSIS — J9610 Chronic respiratory failure, unspecified whether with hypoxia or hypercapnia: Secondary | ICD-10-CM | POA: Diagnosis not present

## 2020-11-01 DIAGNOSIS — M6289 Other specified disorders of muscle: Secondary | ICD-10-CM | POA: Diagnosis not present

## 2020-11-01 DIAGNOSIS — R140 Abdominal distension (gaseous): Secondary | ICD-10-CM | POA: Diagnosis not present

## 2020-11-01 DIAGNOSIS — D649 Anemia, unspecified: Secondary | ICD-10-CM | POA: Diagnosis not present

## 2020-11-02 DIAGNOSIS — M6289 Other specified disorders of muscle: Secondary | ICD-10-CM | POA: Diagnosis not present

## 2020-11-02 DIAGNOSIS — D649 Anemia, unspecified: Secondary | ICD-10-CM | POA: Diagnosis not present

## 2020-11-02 DIAGNOSIS — R140 Abdominal distension (gaseous): Secondary | ICD-10-CM | POA: Diagnosis not present

## 2020-11-02 DIAGNOSIS — J9610 Chronic respiratory failure, unspecified whether with hypoxia or hypercapnia: Secondary | ICD-10-CM | POA: Diagnosis not present

## 2020-11-06 ENCOUNTER — Inpatient Hospital Stay (HOSPITAL_COMMUNITY)
Admission: RE | Admit: 2020-11-06 | Discharge: 2020-11-06 | Disposition: A | Discharge status: Home / Self Care | Payer: Medicare Other | Source: Ambulatory Visit | Attending: Internal Medicine | Admitting: Internal Medicine

## 2020-11-06 ENCOUNTER — Other Ambulatory Visit: Payer: Self-pay

## 2020-11-06 DIAGNOSIS — S76312A Strain of muscle, fascia and tendon of the posterior muscle group at thigh level, left thigh, initial encounter: Secondary | ICD-10-CM | POA: Diagnosis not present

## 2020-11-06 DIAGNOSIS — M6289 Other specified disorders of muscle: Secondary | ICD-10-CM | POA: Diagnosis not present

## 2020-11-06 DIAGNOSIS — L89159 Pressure ulcer of sacral region, unspecified stage: Secondary | ICD-10-CM | POA: Diagnosis not present

## 2020-11-06 DIAGNOSIS — R4182 Altered mental status, unspecified: Secondary | ICD-10-CM | POA: Diagnosis not present

## 2020-11-06 DIAGNOSIS — D259 Leiomyoma of uterus, unspecified: Secondary | ICD-10-CM | POA: Diagnosis not present

## 2020-11-06 DIAGNOSIS — N179 Acute kidney failure, unspecified: Secondary | ICD-10-CM | POA: Diagnosis not present

## 2020-11-06 DIAGNOSIS — I700 Atherosclerosis of aorta: Secondary | ICD-10-CM | POA: Diagnosis not present

## 2020-11-06 DIAGNOSIS — E119 Type 2 diabetes mellitus without complications: Secondary | ICD-10-CM | POA: Diagnosis not present

## 2020-11-06 DIAGNOSIS — D649 Anemia, unspecified: Secondary | ICD-10-CM | POA: Diagnosis not present

## 2020-11-06 DIAGNOSIS — K5730 Diverticulosis of large intestine without perforation or abscess without bleeding: Secondary | ICD-10-CM | POA: Diagnosis not present

## 2020-11-06 DIAGNOSIS — R6889 Other general symptoms and signs: Secondary | ICD-10-CM | POA: Diagnosis not present

## 2020-11-06 DIAGNOSIS — M869 Osteomyelitis, unspecified: Secondary | ICD-10-CM

## 2020-11-06 DIAGNOSIS — Z515 Encounter for palliative care: Secondary | ICD-10-CM | POA: Diagnosis not present

## 2020-11-06 DIAGNOSIS — J9610 Chronic respiratory failure, unspecified whether with hypoxia or hypercapnia: Secondary | ICD-10-CM | POA: Diagnosis not present

## 2020-11-06 DIAGNOSIS — R0602 Shortness of breath: Secondary | ICD-10-CM | POA: Diagnosis not present

## 2020-11-06 MED ORDER — GADOBUTROL 1 MMOL/ML IV SOLN
8.5000 mL | Freq: Once | INTRAVENOUS | Status: AC | PRN
  Administered 2020-11-06: 8.5 mL via INTRAVENOUS

## 2020-11-07 ENCOUNTER — Other Ambulatory Visit: Payer: Self-pay

## 2020-11-07 ENCOUNTER — Encounter (HOSPITAL_COMMUNITY): Payer: Self-pay | Admitting: Emergency Medicine

## 2020-11-07 ENCOUNTER — Emergency Department (HOSPITAL_COMMUNITY): Payer: Medicare Other

## 2020-11-07 ENCOUNTER — Inpatient Hospital Stay (HOSPITAL_COMMUNITY)
Admission: EM | Admit: 2020-11-07 | Discharge: 2020-12-05 | DRG: 951 | Disposition: E | Payer: Medicare Other | Attending: Internal Medicine | Admitting: Internal Medicine

## 2020-11-07 DIAGNOSIS — R4182 Altered mental status, unspecified: Secondary | ICD-10-CM | POA: Diagnosis present

## 2020-11-07 DIAGNOSIS — Z7189 Other specified counseling: Secondary | ICD-10-CM

## 2020-11-07 DIAGNOSIS — F0390 Unspecified dementia without behavioral disturbance: Secondary | ICD-10-CM | POA: Diagnosis present

## 2020-11-07 DIAGNOSIS — N390 Urinary tract infection, site not specified: Secondary | ICD-10-CM

## 2020-11-07 DIAGNOSIS — I110 Hypertensive heart disease with heart failure: Secondary | ICD-10-CM | POA: Diagnosis present

## 2020-11-07 DIAGNOSIS — R0902 Hypoxemia: Secondary | ICD-10-CM | POA: Diagnosis not present

## 2020-11-07 DIAGNOSIS — J9601 Acute respiratory failure with hypoxia: Secondary | ICD-10-CM

## 2020-11-07 DIAGNOSIS — N179 Acute kidney failure, unspecified: Secondary | ICD-10-CM

## 2020-11-07 DIAGNOSIS — R0689 Other abnormalities of breathing: Secondary | ICD-10-CM | POA: Diagnosis not present

## 2020-11-07 DIAGNOSIS — Z8673 Personal history of transient ischemic attack (TIA), and cerebral infarction without residual deficits: Secondary | ICD-10-CM | POA: Diagnosis not present

## 2020-11-07 DIAGNOSIS — R739 Hyperglycemia, unspecified: Secondary | ICD-10-CM

## 2020-11-07 DIAGNOSIS — Z20822 Contact with and (suspected) exposure to covid-19: Secondary | ICD-10-CM | POA: Diagnosis present

## 2020-11-07 DIAGNOSIS — J9621 Acute and chronic respiratory failure with hypoxia: Secondary | ICD-10-CM | POA: Diagnosis present

## 2020-11-07 DIAGNOSIS — Z8249 Family history of ischemic heart disease and other diseases of the circulatory system: Secondary | ICD-10-CM | POA: Diagnosis not present

## 2020-11-07 DIAGNOSIS — Z79890 Hormone replacement therapy (postmenopausal): Secondary | ICD-10-CM

## 2020-11-07 DIAGNOSIS — Z79899 Other long term (current) drug therapy: Secondary | ICD-10-CM

## 2020-11-07 DIAGNOSIS — E1140 Type 2 diabetes mellitus with diabetic neuropathy, unspecified: Secondary | ICD-10-CM | POA: Diagnosis present

## 2020-11-07 DIAGNOSIS — Z66 Do not resuscitate: Secondary | ICD-10-CM | POA: Diagnosis present

## 2020-11-07 DIAGNOSIS — Z931 Gastrostomy status: Secondary | ICD-10-CM | POA: Diagnosis not present

## 2020-11-07 DIAGNOSIS — K219 Gastro-esophageal reflux disease without esophagitis: Secondary | ICD-10-CM | POA: Diagnosis present

## 2020-11-07 DIAGNOSIS — Z86711 Personal history of pulmonary embolism: Secondary | ICD-10-CM | POA: Diagnosis not present

## 2020-11-07 DIAGNOSIS — Z833 Family history of diabetes mellitus: Secondary | ICD-10-CM | POA: Diagnosis not present

## 2020-11-07 DIAGNOSIS — E785 Hyperlipidemia, unspecified: Secondary | ICD-10-CM | POA: Diagnosis present

## 2020-11-07 DIAGNOSIS — Z794 Long term (current) use of insulin: Secondary | ICD-10-CM | POA: Diagnosis not present

## 2020-11-07 DIAGNOSIS — I700 Atherosclerosis of aorta: Secondary | ICD-10-CM | POA: Diagnosis not present

## 2020-11-07 DIAGNOSIS — A419 Sepsis, unspecified organism: Secondary | ICD-10-CM | POA: Diagnosis present

## 2020-11-07 DIAGNOSIS — R0602 Shortness of breath: Secondary | ICD-10-CM | POA: Diagnosis not present

## 2020-11-07 DIAGNOSIS — I5032 Chronic diastolic (congestive) heart failure: Secondary | ICD-10-CM | POA: Diagnosis present

## 2020-11-07 DIAGNOSIS — Z789 Other specified health status: Secondary | ICD-10-CM | POA: Diagnosis not present

## 2020-11-07 DIAGNOSIS — Z7902 Long term (current) use of antithrombotics/antiplatelets: Secondary | ICD-10-CM

## 2020-11-07 DIAGNOSIS — R231 Pallor: Secondary | ICD-10-CM | POA: Diagnosis not present

## 2020-11-07 DIAGNOSIS — T83511A Infection and inflammatory reaction due to indwelling urethral catheter, initial encounter: Secondary | ICD-10-CM

## 2020-11-07 DIAGNOSIS — G894 Chronic pain syndrome: Secondary | ICD-10-CM | POA: Diagnosis present

## 2020-11-07 DIAGNOSIS — Z515 Encounter for palliative care: Secondary | ICD-10-CM | POA: Diagnosis not present

## 2020-11-07 DIAGNOSIS — E039 Hypothyroidism, unspecified: Secondary | ICD-10-CM | POA: Diagnosis present

## 2020-11-07 DIAGNOSIS — I639 Cerebral infarction, unspecified: Secondary | ICD-10-CM | POA: Diagnosis present

## 2020-11-07 LAB — URINALYSIS, ROUTINE W REFLEX MICROSCOPIC
Bilirubin Urine: NEGATIVE
Glucose, UA: 150 mg/dL — AB
Ketones, ur: NEGATIVE mg/dL
Nitrite: NEGATIVE
Protein, ur: 30 mg/dL — AB
Specific Gravity, Urine: 1.015 (ref 1.005–1.030)
pH: 5 (ref 5.0–8.0)

## 2020-11-07 LAB — LACTIC ACID, PLASMA: Lactic Acid, Venous: 1.3 mmol/L (ref 0.5–1.9)

## 2020-11-07 LAB — CBC WITH DIFFERENTIAL/PLATELET
Abs Immature Granulocytes: 0.19 10*3/uL — ABNORMAL HIGH (ref 0.00–0.07)
Basophils Absolute: 0 10*3/uL (ref 0.0–0.1)
Basophils Relative: 0 %
Eosinophils Absolute: 0 10*3/uL (ref 0.0–0.5)
Eosinophils Relative: 0 %
HCT: 22.4 % — ABNORMAL LOW (ref 36.0–46.0)
Hemoglobin: 6.8 g/dL — CL (ref 12.0–15.0)
Immature Granulocytes: 1 %
Lymphocytes Relative: 4 %
Lymphs Abs: 0.8 10*3/uL (ref 0.7–4.0)
MCH: 28.9 pg (ref 26.0–34.0)
MCHC: 30.4 g/dL (ref 30.0–36.0)
MCV: 95.3 fL (ref 80.0–100.0)
Monocytes Absolute: 1.2 10*3/uL — ABNORMAL HIGH (ref 0.1–1.0)
Monocytes Relative: 6 %
Neutro Abs: 17.3 10*3/uL — ABNORMAL HIGH (ref 1.7–7.7)
Neutrophils Relative %: 89 %
Platelets: 295 10*3/uL (ref 150–400)
RBC: 2.35 MIL/uL — ABNORMAL LOW (ref 3.87–5.11)
RDW: 15.6 % — ABNORMAL HIGH (ref 11.5–15.5)
WBC: 19.6 10*3/uL — ABNORMAL HIGH (ref 4.0–10.5)
nRBC: 0 % (ref 0.0–0.2)

## 2020-11-07 LAB — BASIC METABOLIC PANEL
Anion gap: 10 (ref 5–15)
BUN: 104 mg/dL — ABNORMAL HIGH (ref 8–23)
CO2: 29 mmol/L (ref 22–32)
Calcium: 8.6 mg/dL — ABNORMAL LOW (ref 8.9–10.3)
Chloride: 90 mmol/L — ABNORMAL LOW (ref 98–111)
Creatinine, Ser: 2.1 mg/dL — ABNORMAL HIGH (ref 0.44–1.00)
GFR, Estimated: 23 mL/min — ABNORMAL LOW (ref >60)
Glucose, Bld: 407 mg/dL — ABNORMAL HIGH (ref 70–99)
Potassium: 5.4 mmol/L — ABNORMAL HIGH (ref 3.5–5.1)
Sodium: 129 mmol/L — ABNORMAL LOW (ref 135–145)

## 2020-11-07 LAB — I-STAT ARTERIAL BLOOD GAS, ED
Acid-Base Excess: 9 mmol/L — ABNORMAL HIGH (ref 0.0–2.0)
Bicarbonate: 33.7 mmol/L — ABNORMAL HIGH (ref 20.0–28.0)
Calcium, Ion: 1.12 mmol/L — ABNORMAL LOW (ref 1.15–1.40)
HCT: 22 % — ABNORMAL LOW (ref 36.0–46.0)
Hemoglobin: 7.5 g/dL — ABNORMAL LOW (ref 12.0–15.0)
O2 Saturation: 98 %
Patient temperature: 101.7
Potassium: 5.4 mmol/L — ABNORMAL HIGH (ref 3.5–5.1)
Sodium: 129 mmol/L — ABNORMAL LOW (ref 135–145)
TCO2: 35 mmol/L — ABNORMAL HIGH (ref 22–32)
pCO2 arterial: 50.1 mmHg — ABNORMAL HIGH (ref 32.0–48.0)
pH, Arterial: 7.443 (ref 7.350–7.450)
pO2, Arterial: 111 mmHg — ABNORMAL HIGH (ref 83.0–108.0)

## 2020-11-07 LAB — RESP PANEL BY RT-PCR (FLU A&B, COVID) ARPGX2
Influenza A by PCR: NEGATIVE
Influenza B by PCR: NEGATIVE
SARS Coronavirus 2 by RT PCR: NEGATIVE

## 2020-11-07 LAB — I-STAT CHEM 8, ED
BUN: 129 mg/dL — ABNORMAL HIGH (ref 8–23)
Calcium, Ion: 1.13 mmol/L — ABNORMAL LOW (ref 1.15–1.40)
Chloride: 90 mmol/L — ABNORMAL LOW (ref 98–111)
Creatinine, Ser: 2.1 mg/dL — ABNORMAL HIGH (ref 0.44–1.00)
Glucose, Bld: 420 mg/dL — ABNORMAL HIGH (ref 70–99)
HCT: 27 % — ABNORMAL LOW (ref 36.0–46.0)
Hemoglobin: 9.2 g/dL — ABNORMAL LOW (ref 12.0–15.0)
Potassium: 5.4 mmol/L — ABNORMAL HIGH (ref 3.5–5.1)
Sodium: 129 mmol/L — ABNORMAL LOW (ref 135–145)
TCO2: 28 mmol/L (ref 22–32)

## 2020-11-07 LAB — BRAIN NATRIURETIC PEPTIDE: B Natriuretic Peptide: 278.3 pg/mL — ABNORMAL HIGH (ref 0.0–100.0)

## 2020-11-07 LAB — TROPONIN I (HIGH SENSITIVITY): Troponin I (High Sensitivity): 75 ng/L — ABNORMAL HIGH (ref <18)

## 2020-11-07 LAB — URINE CULTURE

## 2020-11-07 MED ORDER — LORAZEPAM 2 MG/ML IJ SOLN: 1.0000 mg | INTRAMUSCULAR | Status: DC | PRN

## 2020-11-07 MED ORDER — LORAZEPAM 2 MG/ML PO CONC: 1.0000 mg | ORAL | Status: DC | PRN

## 2020-11-07 MED ORDER — HALOPERIDOL LACTATE 2 MG/ML PO CONC: 0.5000 mg | ORAL | Status: DC | PRN

## 2020-11-07 MED ORDER — ACETAMINOPHEN 650 MG RE SUPP: 650.0000 mg | Freq: Four times a day (QID) | RECTAL | Status: DC | PRN

## 2020-11-07 MED ORDER — GLYCOPYRROLATE 1 MG PO TABS: 1.0000 mg | ORAL_TABLET | ORAL | Status: DC | PRN

## 2020-11-07 MED ORDER — POLYVINYL ALCOHOL 1.4 % OP SOLN: 1.0000 [drp] | Freq: Four times a day (QID) | OPHTHALMIC | Status: DC | PRN

## 2020-11-07 MED ORDER — FUROSEMIDE 10 MG/ML IJ SOLN
80.0000 mg | Freq: Once | INTRAMUSCULAR | Status: AC
  Administered 2020-11-07: 80 mg via INTRAVENOUS

## 2020-11-07 MED ORDER — BIOTENE DRY MOUTH MT LIQD: 15.0000 mL | Freq: Two times a day (BID) | OROMUCOSAL | Status: DC

## 2020-11-07 MED ORDER — HALOPERIDOL LACTATE 2 MG/ML PO CONC: 2.0000 mg | Freq: Four times a day (QID) | ORAL | Status: DC | PRN

## 2020-11-07 MED ORDER — HALOPERIDOL 1 MG PO TABS: 2.0000 mg | ORAL_TABLET | Freq: Four times a day (QID) | ORAL | Status: DC | PRN

## 2020-11-07 MED ORDER — ONDANSETRON HCL 4 MG/2ML IJ SOLN
4.0000 mg | Freq: Four times a day (QID) | INTRAMUSCULAR | Status: DC | PRN
  Filled 2020-11-07: qty 2

## 2020-11-07 MED ORDER — DIPHENHYDRAMINE HCL 50 MG/ML IJ SOLN: 12.5000 mg | INTRAMUSCULAR | Status: DC | PRN

## 2020-11-07 MED ORDER — VANCOMYCIN HCL 1500 MG/300ML IV SOLN
1500.0000 mg | Freq: Once | INTRAVENOUS | Status: AC
  Administered 2020-11-07: 1500 mg via INTRAVENOUS
  Filled 2020-11-07: qty 300

## 2020-11-07 MED ORDER — HALOPERIDOL LACTATE 5 MG/ML IJ SOLN: 0.5000 mg | INTRAMUSCULAR | Status: DC | PRN

## 2020-11-07 MED ORDER — SODIUM CHLORIDE 0.9 % IV SOLN
2.0000 g | Freq: Once | INTRAVENOUS | Status: AC
  Administered 2020-11-07: 2 g via INTRAVENOUS
  Filled 2020-11-07: qty 2

## 2020-11-07 MED ORDER — ONDANSETRON 4 MG PO TBDP: 4.0000 mg | ORAL_TABLET | Freq: Four times a day (QID) | ORAL | Status: DC | PRN

## 2020-11-07 MED ORDER — GLYCOPYRROLATE 0.2 MG/ML IJ SOLN
0.2000 mg | INTRAMUSCULAR | Status: DC | PRN
  Administered 2020-11-08: 0.2 mg via INTRAVENOUS

## 2020-11-07 MED ORDER — LORAZEPAM 1 MG PO TABS: 1.0000 mg | ORAL_TABLET | ORAL | Status: DC | PRN

## 2020-11-07 MED ORDER — BIOTENE DRY MOUTH MT LIQD: 15.0000 mL | OROMUCOSAL | Status: DC | PRN

## 2020-11-07 MED ORDER — MORPHINE BOLUS VIA INFUSION
2.0000 mg | INTRAVENOUS | Status: DC | PRN
  Administered 2020-11-07: 2 mg via INTRAVENOUS
  Filled 2020-11-07: qty 4

## 2020-11-07 MED ORDER — GLYCOPYRROLATE 0.2 MG/ML IJ SOLN
0.2000 mg | INTRAMUSCULAR | Status: DC | PRN
  Filled 2020-11-07: qty 1

## 2020-11-07 MED ORDER — MORPHINE 100MG IN NS 100ML (1MG/ML) PREMIX INFUSION
5.0000 mg/h | INTRAVENOUS | Status: DC
  Administered 2020-11-07 – 2020-11-08 (×2): 5 mg/h via INTRAVENOUS
  Filled 2020-11-07 (×2): qty 100

## 2020-11-07 MED ORDER — GLYCOPYRROLATE 0.2 MG/ML IJ SOLN: 0.2000 mg | Freq: Once | INTRAMUSCULAR | Status: DC

## 2020-11-07 MED ORDER — HALOPERIDOL LACTATE 5 MG/ML IJ SOLN: 2.0000 mg | Freq: Four times a day (QID) | INTRAMUSCULAR | Status: DC | PRN

## 2020-11-07 MED ORDER — ACETAMINOPHEN 325 MG PO TABS: 650.0000 mg | ORAL_TABLET | Freq: Four times a day (QID) | ORAL | Status: DC | PRN

## 2020-11-07 MED ORDER — ACETAMINOPHEN 650 MG RE SUPP
650.0000 mg | Freq: Once | RECTAL | Status: AC
  Administered 2020-11-07: 650 mg via RECTAL
  Filled 2020-11-07: qty 1

## 2020-11-07 MED ORDER — MORPHINE BOLUS VIA INFUSION
2.0000 mg | INTRAVENOUS | Status: DC | PRN
  Filled 2020-11-07: qty 2

## 2020-11-07 MED ORDER — GLYCOPYRROLATE 0.2 MG/ML IJ SOLN
0.4000 mg | Freq: Once | INTRAMUSCULAR | Status: AC
  Administered 2020-11-07: 0.4 mg via INTRAVENOUS
  Filled 2020-11-07: qty 2

## 2020-11-07 MED ORDER — HALOPERIDOL 0.5 MG PO TABS
0.5000 mg | ORAL_TABLET | ORAL | Status: DC | PRN
Start: 2020-11-07 — End: 2020-11-07

## 2020-11-07 NOTE — ED Provider Notes (Signed)
Pt signed out by Dr. Nicanor Alcon.  Pt is more comfortable on bipap.  She has already given maxipime and vancomycin already.  Pt does have a UTI.  Pt d/w Dr. Ophelia Charter ([facility]ad) for admission.   Jacalyn Lefevre, MD 11-10-2020 [phone]

## 2020-11-07 NOTE — ED Triage Notes (Signed)
Per EMS, from [facility], last seen at midnight, facility stated "it sounds like she was drowning."  Minimal responsive at baseline non-verbal as result of previous stroke  DNR Initially 65% on 3L [place] (home O2)  increased to 99% on RA.    Placed on bipap as soon as she arrived

## 2020-11-07 NOTE — H&P (Signed)
History and Physical    Hannah Fischer QVZ:[id number] DOB: 13-Jul-1932 DOA: 2020-11-22  PCP: Sigmund Hazel, MD Consultants:  Anne Fu - cardiology Patient coming from: [facility]; NOK: Zachary George [place], [phone]; [phone]  Chief Complaint: respiratory distress  HPI: Hannah Fischer is a 87 y.o. 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  Her husband and daughter were present upon my arrival and report that her facility notified them about SOB.  After discussion, family reports that she has suffered long enough and would prefer to die naturally.  They have requested comfort care only.    ED Course: SOB with diaphoresis.   Placed on BIPAP.  Has fever with UTI.  Mild AKI.  Given Vanc/Cefepime.  Review of Systems: Unable to perform  Ambulatory Status:  Non-ambulatory  COVID Vaccine Status:  Unknown  Past Medical History:  Diagnosis Date   Anemia    chronic   Anxiety    Arthritis    djd   Arthritis of shoulder region, left, degenerative 12/03/2013   Chronic pain syndrome 04/29/2019   Chronic right-sided low back pain without sciatica 04/12/2019   Complication of anesthesia    unable to breathe lying  flat on back   Diabetes mellitus    type 2  niddm x 25 yrs   Diverticular disease    DJD (degenerative joint disease)    lumbar and cervical   GERD (gastroesophageal reflux disease)    Hyperlipemia    Hypertension    on medication   Hypothyroidism    takes levoxyl   Irritable bowel syndrome    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   Neuropathy in diabetes [facility])    Other and unspecified angina pectoris    tests came back NOT heart related   Pulmonary embolism (HCC) 06/2010   bilateral   TIA (transient  ischemic attack) 11/15/2011    Past Surgical History:  Procedure Laterality Date   BACK SURGERY     BLEPHAROPLASTY  2012   rt eye   CARDIAC CATHETERIZATION     had one 10/2013   CHOLECYSTECTOMY     EYE SURGERY  2007   cataract ext/ iol rt eye   IR GASTROSTOMY TUBE MOD SED  04/17/2020   IR NEPHROSTOGRAM RIGHT THRU EXISTING ACCESS  04/30/2020   IR NEPHROSTOMY PLACEMENT RIGHT  03/27/2020   IR US GUIDANCE  03/27/2020   IR US GUIDANCE  04/30/2020   JOINT REPLACEMENT  2001,2005   both knees   LEFT HEART CATHETERIZATION WITH CORONARY ANGIOGRAM N/A 10/18/2013   Procedure: LEFT HEART CATHETERIZATION WITH CORONARY ANGIOGRAM;  Surgeon: Corky Crafts, MD;  Location: [facility] CATH LAB;  Service: Cardiovascular;  Laterality: N/A;   LUMBAR DISC SURGERY  1990   REVERSE SHOULDER ARTHROPLASTY Left 12/03/2013   Procedure: LEFT SHOULDER REVERSE ARTHROPLASTY;  Surgeon: Verlee Rossetti, MD;  Location: [facility] OR;  Service: Orthopedics;  Laterality: Left;   SHOULDER HEMI-ARTHROPLASTY  12/25/2010   Procedure: SHOULDER HEMI-ARTHROPLASTY;  Surgeon: Cammy Copa;  Location: [facility] OR;  Service: Orthopedics;  Laterality: Right;    Social History   Socioeconomic History   Marital status: Married    Spouse name: Not on file   Number of children: Not on file   Years of education: Not  on file   Highest education level: Not on file  Occupational History   Not on file  Tobacco Use   Smoking status: Never   Smokeless tobacco: Never  Substance and Sexual Activity   Alcohol use: No   Drug use: No   Sexual activity: Never    Birth control/protection: Post-menopausal  Other Topics Concern   Not on file  Social History Narrative   Not on file   Social Determinants of Health   Financial Resource Strain: Not on file  Food Insecurity: Not on file  Transportation Needs: Not on file  Physical Activity: Not on file  Stress: Not on file  Social Connections: Not on file  Intimate Partner Violence: Not on file     Allergies  Allergen Reactions   Atorvastatin Other (See Comments)    "Aches"   Ezetimibe-Simvastatin Other (See Comments)    "Aches"   Lovastatin Other (See Comments)    Weakness   Metformin Hcl Diarrhea   Pravastatin Other (See Comments)    Weakness   Codeine Anxiety and Other (See Comments)    Keeps the patient awake   Simvastatin Nausea Only and Other (See Comments)    Muscle weakness and an stomach upset   Sulfa Antibiotics Other (See Comments)    Gastric intolerance and caused stomach upset   Sulfites Other (See Comments)    Abdominal pain    Family History  Problem Relation Age of Onset   Diabetes Mother    Heart disease Mother    Heart disease Sister    Heart disease Brother     Prior to Admission medications   Medication Sig Start Date End Date Taking? Authorizing Provider  acetaminophen (TYLENOL) 325 MG tablet 2 tablets (650 mg total) by Per NG tube route every 6 (six) hours. 05/01/20  Yes Russella Dar, NP  amLODipine (NORVASC) 5 MG tablet Place 1 tablet (5 mg total) into feeding tube daily. 05/02/20  Yes Russella Dar, NP  busPIRone (BUSPAR) 5 MG tablet Place 7.5 mg into feeding tube 2 (two) times daily.   Yes [provider]  clopidogrel (PLAVIX) 75 MG tablet Place 1 tablet (75 mg total) into feeding tube daily. 05/02/20  Yes Russella Dar, NP  Collagenase (SANTYL EX) Apply 1 application topically daily.   Yes [provider]  diclofenac Sodium (VOLTAREN) 1 % GEL Apply 4 g topically in the morning, at noon, and at bedtime. 08/09/20  Yes [provider]  docusate (COLACE) 50 MG/5ML liquid Place 10 mLs (100 mg total) into feeding tube 2 (two) times daily as needed for mild constipation. 05/01/20  Yes Russella Dar, NP  Ferrous Sulfate 220 (44 Fe) MG/5ML SOLN Give 7.4 mLs by tube in the morning.   Yes [provider]  HUMALOG KWIKPEN 100 UNIT/ML KwikPen Inject 0-9 Units into the skin every 6 (six) hours. Per sliding  scale: BS-70-120= 0 units, 121-150=1 unit,151-200= 2 units,201-250=3 units,251-300=5 units, 301--350=7 units,351-400= 9 units, > 400 call MD 09/18/20  Yes [provider]  hydrALAZINE (APRESOLINE) 10 MG tablet Place 10 mg into feeding tube 3 (three) times daily. 08/10/20  Yes [provider]  hydroxypropyl methylcellulose / hypromellose (ISOPTO TEARS / GONIOVISC) 2.5 % ophthalmic solution Place 1 drop into both eyes 4 (four) times daily as needed for dry eyes. 05/01/20  Yes Russella Dar, NP  Infant Care Products (JOHNSONS BABY SHAMPOO EX) Apply 1 application topically See admin instructions. Wash bilateral eyes once daily  Yes [provider]  insulin detemir (LEVEMIR) 100 UNIT/ML injection Inject 0.11 mLs (11 Units total) into the skin daily. Patient taking differently: Inject 8 Units into the skin 2 (two) times daily. 10/04/20  Yes Regalado, Belkys A, MD  levothyroxine (SYNTHROID) 112 MCG tablet Place 1 tablet (112 mcg total) into feeding tube daily before breakfast. 05/02/20  Yes Russella Dar, NP  liver oil-zinc oxide (DESITIN) 40 % ointment Apply topically 2 (two) times daily. Patient taking differently: Apply 1 application topically in the morning and at bedtime. 05/01/20  Yes Russella Dar, NP  methocarbamol (ROBAXIN) 750 MG tablet Place 1 tablet (750 mg total) into feeding tube 4 (four) times daily. 05/01/20  Yes Russella Dar, NP  modafinil (PROVIGIL) 200 MG tablet Place 1 tablet (200 mg total) into feeding tube daily. 05/02/20  Yes Russella Dar, NP  Nutritional Supplements (FEEDING SUPPLEMENT, GLUCERNA 1.2 CAL,) LIQD Place 1,000 mLs into feeding tube continuous. Patient taking differently: Place 60 mLs into feeding tube See admin instructions. 60 cc/ hr every shift 05/01/20  Yes Russella Dar, NP  Nutritional Supplements (FEEDING SUPPLEMENT, PROSOURCE TF,) liquid Place 45 mLs into feeding tube 3 (three) times daily. 05/01/20  Yes Russella Dar, NP   oxyCODONE (OXY IR/ROXICODONE) 5 MG immediate release tablet Place 0.5 tablets (2.5 mg total) into feeding tube every 8 (eight) hours. Patient taking differently: Place 5 mg into feeding tube every 4 (four) hours. Hold for lethargy 05/01/20  Yes Russella Dar, NP  polyethylene glycol (MIRALAX / GLYCOLAX) 17 g packet Place 17 g into feeding tube daily as needed for moderate constipation. 05/01/20  Yes Russella Dar, NP  rosuvastatin (CRESTOR) 5 MG tablet Place 0.5 tablets (2.5 mg total) into feeding tube daily at 6 PM. 05/01/20  Yes Russella Dar, NP  sodium hypochlorite (DAKIN'S 1/4 STRENGTH) 0.125 % SOLN Irrigate with 1 application as directed 2 (two) times daily. Use to cleanse PU  to coccyx   Yes [provider]  Water For Irrigation, Sterile (FREE WATER) SOLN Place 200 mLs into feeding tube every 4 (four) hours. 10/03/20  Yes Regalado, Belkys A, MD  amoxicillin-clavulanate (AUGMENTIN) 875-125 MG tablet Take 1 tablet by mouth See admin instructions. Bid x 7 days Patient not taking: No sig reported    [provider]  cefTRIAXone (ROCEPHIN) 1 g injection Inject 1 g into the muscle once. Patient not taking: No sig reported    [provider]  levETIRAcetam (KEPPRA) 100 MG/ML solution Take 2.5 mLs (250 mg total) by mouth 2 (two) times daily. Patient not taking: No sig reported 10/20/20   Levert Feinstein, MD    Physical Exam: Vitals:   11/09/2020 0700 11/09/20 0715 2020/11/09 0730 Nov 09, 2020 0800  BP: (!) 126/58 (!) 122/59 (!) 127/58 (!) 130/59  Pulse: 81 80 81 80  Resp: (!) 30 (!) 31 (!) 31 (!) 27  Temp:      TempSrc:      SpO2: 98% 99% 100% 100%  Weight:      Height:         General:  Appears chronically ill, unresponsive, on BIPAP Eyes:  normal lids, iris ENT:  BIPAP in place Neck:  no LAD, masses or thyromegaly Cardiovascular:  RRR, no m/r/g. No LE edema.  Respiratory:   Scattered rhonchi.  Increased respiratory effort. Abdomen:  soft, NT, ND; PEG tube in  place Skin:  no rash or induration seen on limited exam Musculoskeletal:  contractures of both  upper and lower extremities Psychiatric:  unresponsive Neurologic:  unable to perform    Radiological Exams on Admission: Independently reviewed - see discussion in A/P where applicable  DG Chest Port 1 View  Result Date: 2020/11/10 CLINICAL DATA:  87 year old female with history of shortness of breath. Unresponsive patient. EXAM: PORTABLE CHEST 1 VIEW COMPARISON:  Chest x-ray 10/07/2020. FINDINGS: Lung volumes are low. Chronic elevation of the right hemidiaphragm. No consolidative airspace disease. No pleural effusions. No pneumothorax. No pulmonary nodule or mass noted. Pulmonary vasculature and the cardiomediastinal silhouette are within normal limits. Atherosclerotic calcifications in the thoracic aorta. Severe calcifications of the mitral annulus. Status post bilateral shoulder arthroplasty. IMPRESSION: 1. Low lung volumes without radiographic evidence of acute cardiopulmonary disease. 2. Aortic atherosclerosis. 3. Severe calcifications of the mitral annulus. Electronically Signed   By: Trudie Reed M.D.   On: Nov 10, 2020 07:10    EKG: Independently reviewed.  NSR with rate 87; LBBB with NSCSLT   Labs on Admission: I have personally reviewed the available labs and imaging studies at the time of the admission.  Pertinent labs:   ABG: 7.443/50.1/111/33.7/98% Na++ 129 K+ 5.4 Glucose 407 BUN 104/Creatinine 2.10/GFR 23 BNP 278.3 HS troponin 75 Lactate 1.3 UA: 150 glucose; small Hgb; large LE; 30 protein; many bacteria   Assessment/Plan Active Problems:   * No active hospital problems. *   End of life care -Patient presenting with apparent sepsis, possibly related to UTI -After discussion in the ER, family has decided to proceed with comfort care only -Will admit to [facility] for comfort care and palliative care consult -Patient is likely to be a candidate for [facility] but does  appear to be actively dying at this time and so she may have an in-hospital demise -Comfort care order set utilized -Pain control with morphine drip, as the patient is unable to vocalize whether she is having pain  AKI -Normal baseline renal function, currently with AKI -She is on tube feeds so she should be receiving appropriate hydration at baseline -She may have increased metabolic needs in the setting of infection -No further treatment, as patient is transitioning to comfort only   H/o CVA -Significant deficits on exam -PEG tube, no PO intake at baseline   Acute on chronic respiratory failure with hypoxia -Chronic hypoxia of uncertain cause, on [place] O2 at her facility -Marked hypoxia on presentation despite home O2 -Placed on BIPAP on arrival -Will d/c BIPAP, which was placed on arrival -Transition back to [place] O2 for comfort only   Goals of care -Patient appears to have a very limited QOL at baseline -Has DNR form at the bedside -Palliative care consulted for end of life care       Note: This patient has been tested and is negative for the novel coronavirus COVID-19.       Level of care: Palliative Care  DVT prophylaxis: None - comfort measures Code Status: DNR - confirmed with family Family Communication: Husband and daughter were present throughout evaluation Disposition Plan: Anticipate in-hospital death Consults called: Palliative care Admission status: Admit - It is my clinical opinion that admission to INPATIENT is reasonable and necessary because of the expectation that this patient will require hospital care that crosses at least 2 midnights to treat this condition based on the medical complexity of the problems presented.  Given the aforementioned information, the predictability of an adverse outcome is felt to be significant.     Jonah Blue MD [facility]   How to contact  the [facility] Attending or Consulting provider 7A - 7P or covering provider during  after hours 7P -7A, for this patient?  Check the care team in [facility] and look for a) attending/consulting [facility] provider listed and b) the [facility] team listed Log into [email or web address] and use [place]'s universal password to access. If you do not have the password, please contact the hospital operator. Locate the [facility] provider you are looking for under [facility] and page to a number that you can be directly reached. If you still have difficulty reaching the provider, please page the [facility] (Director on Call) for the Hospitalists listed on amion for assistance.   11/13/2020, 8:17 AM

## 2020-11-07 NOTE — Consult Note (Signed)
Consultation Note Date: 11/29/2020   Patient Name: Hannah Fischer  DOB: 1932/11/17  MRN: [id number]  Age / Sex: 87 y.o., female  PCP: [name], MD Referring Physician: Karmen Bongo, MD  Reason for Consultation: Disposition, Establishing goals of care, Non pain symptom management, Pain control, Psychosocial/spiritual support, and Terminal Care  HPI/Patient Profile: 87 y.o. 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 Patient was placed on BiPap in ED. After admitting MD discussion with family, they opted for transition to full comfort measures. Patient was admitted on 11/16/2020 for end of life care.   Of note, patient has been extensively seen by PMT during previous admissions.  Clinical Assessment and Goals of Care: I have reviewed medical records including EPIC notes, labs, and imaging. Received report from primary RN - no acute concerns.    Went to visit patient at bedside - husband/[name], daughter/Tammy, son/Terry, pastor/Ed present. Patient was lying in bed - she does not wake to voice/gentle touch. No signs or non-verbal gestures of pain or discomfort noted. No respiratory distress or increased work of breathing; course lung sounds noted with auscultation.   Met with family  to discuss diagnosis, prognosis, GOC, EOL wishes, disposition, and options.  I introduced Palliative Medicine as specialized medical care for people living with serious illness. It focuses on providing relief from the symptoms and stress of a serious illness. The goal is to improve quality of life  for both the patient and the family.  We discussed patient's current illness and what it means in the larger context of patient's on-going co-morbidities.  Natural disease trajectory and expectations at EOL were discussed. I attempted to elicit values and goals of care important to the patient. The difference between aggressive medical intervention and comfort care was considered in light of the patient's goals of care. We talked about transition to comfort measures in house and what that would entail inclusive of medications to control pain, dyspnea, agitation, nausea, and itching. We discussed stopping all unnecessary measures such as blood draws, needle sticks, oxygen, antibiotics, CBGs/insulin, cardiac monitoring, IVF, and frequent vital signs. Family are clear in their desire for full comfort measures at this time. Husband states, "she has suffered enough, she deserves to be comfortable."   Therapeutic listening provided as husband reflects on their 9 year marriage.   Provided education and counseling at length on the philosophy and benefits of hospice care. Discussed that it offers a holistic approach to care in the setting of end-stage illness, and is about supporting the patient where they are allowing nature to take it's course. Discussed the hospice team includes RNs, physicians, social workers, and chaplains. They can provide personal care, support for the family, and help keep patient out of the  hospital as well as assist with DME needs for home hospice. Education provided on the difference between home vs residential hospice. Provided reassurance that residential hospice referral could be cancelled (would anticipate hospital death) at any time if patient's condition changed and it was felt they were too unstable for transfer. After detailed discussion, family are interested in [facility] referral. Family understand that if a bed is not available at [facility] today, patient will likely be too  unstable in the near future for transfer, in which we would anticipate a hospital death - family express understanding.   Visit also consisted of discussions dealing with the complex and emotionally intense issues of symptom management and palliative care in the setting of serious and potentially life-threatening illness. Palliative care team will continue to support patient, patient's family, and medical team.  Discussed with patient/family the importance of continued conversation with each other and the medical providers regarding overall plan of care and treatment options, ensuring decisions are within the context of the patient's values and GOCs.    Questions and concerns were addressed. The patient/family was encouraged to call with questions and/or concerns. PMT card was provided.   Primary Decision Maker: HCPOA/husband/[name]    SUMMARY OF RECOMMENDATIONS   Continue full comfort measures - prognosis hours to days Continue DNR/DNI as previously documented Family agreeable for patient transfer to [facility] liaison and TOC notified. Eligibility was confirmed but no bed available today. I anticipate patient will not be stable for transport tomorrow - can reassess if she lives through the night. Family understand patient may be hospital death [facility] consult placed for: [place] referral Added orders for EOL symptom management and to reflect full comfort measures, as well as discontinued orders that were not focused on comfort Unrestricted visitation orders were placed per current [place] EOL visitation policy  Nursing to provide frequent assessments and administer PRN medications as clinically necessary to ensure EOL comfort PMT will continue to follow and support holistically   Code Status/Advance Care Planning: DNR  Palliative Prophylaxis:  Aspiration, Bowel Regimen, Delirium Protocol, Frequent Pain Assessment, Oral Care, and Turn  Reposition  Additional Recommendations (Limitations, Scope, Preferences): Full Comfort Care  Psycho-social/Spiritual:  Desire for further Chaplaincy support:no Created space and opportunity for patient and family to express thoughts and feelings regarding patient's current medical situation.  Emotional support and therapeutic listening provided.  Prognosis:  Hours - Days  Discharge Planning: Hospital death vs Hospice facility      Primary Diagnoses: Present on Admission:  Sepsis due to urinary tract infection ([place])   I have reviewed the medical record, interviewed the patient and family, and examined the patient. The following aspects are pertinent.  Past Medical History:  Diagnosis Date   Anemia    chronic   Anxiety    Arthritis    djd   Arthritis of shoulder region, left, degenerative 12/03/2013   Chronic pain syndrome 04/29/2019   Chronic right-sided low back pain without sciatica 16/11/9602   Complication of anesthesia    unable to breathe lying  flat on back   Diabetes mellitus    type 2  niddm x 25 yrs   Diverticular disease    DJD (degenerative joint disease)    lumbar and cervical   GERD (gastroesophageal reflux disease)    Hyperlipemia    Hypertension    on medication   Hypothyroidism    takes levoxyl   Irritable bowel syndrome    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   Neuropathy in diabetes [facility])    Other and unspecified angina pectoris    tests came back NOT heart related   Pulmonary embolism ([place]) 06/2010   bilateral   TIA (transient ischemic attack) 11/15/2011   Social History   Socioeconomic History   Marital status: Married    Spouse name: Not on file   Number of children: Not on file   Years of education: Not on file   Highest education level: Not on file  Occupational History   Not on file  Tobacco Use   Smoking status: Never   Smokeless tobacco: Never  Substance and Sexual Activity   Alcohol use: No   Drug use: No    Sexual activity: Never    Birth control/protection: Post-menopausal  Other Topics Concern   Not on file  Social History Narrative   Not on file   Social Determinants of Health   Financial Resource Strain: Not on file  Food Insecurity: Not on file  Transportation Needs: Not on file  Physical Activity: Not on file  Stress: Not on file  Social Connections: Not on file   Family History  Problem Relation Age of Onset   Diabetes Mother    Heart disease Mother    Heart disease Sister    Heart disease Brother    Scheduled Meds: Continuous Infusions:  morphine 5 mg/hr (11/06/2020 1023)   PRN Meds:.acetaminophen **OR** acetaminophen, antiseptic oral rinse, diphenhydrAMINE, glycopyrrolate **OR** glycopyrrolate **OR** glycopyrrolate, haloperidol **OR** haloperidol **OR** haloperidol lactate, LORazepam **OR** LORazepam **OR** LORazepam, morphine, ondansetron **OR** ondansetron (ZOFRAN) IV, polyvinyl alcohol Medications Prior to Admission:  Prior to Admission medications   Medication Sig Start Date End Date Taking? Authorizing Provider  acetaminophen (TYLENOL) 325 MG tablet 2 tablets (650 mg total) by Per NG tube route every 6 (six) hours. 05/01/20  Yes Samella Parr, NP  amLODipine (NORVASC) 5 MG tablet Place 1 tablet (5 mg total) into feeding tube daily. 05/02/20  Yes Samella Parr, NP  busPIRone (BUSPAR) 5 MG tablet Place 7.5 mg into feeding tube 2 (two) times daily.   Yes [provider]  clopidogrel (PLAVIX) 75 MG tablet Place 1 tablet (75 mg total) into feeding tube daily. 05/02/20  Yes Samella Parr, NP  Collagenase (SANTYL EX) Apply 1 application topically daily.   Yes [provider]  diclofenac Sodium (VOLTAREN) 1 % GEL Apply 4 g topically in the morning, at noon, and at bedtime. 08/09/20  Yes [provider]  docusate (COLACE) 50 MG/5ML liquid Place 10 mLs (100 mg total) into feeding tube 2 (two) times daily as needed for mild constipation. 05/01/20  Yes  Samella Parr, NP  Ferrous Sulfate 220 (44 Fe) MG/5ML SOLN Give 7.4 mLs by tube in the morning.   Yes [provider]  HUMALOG KWIKPEN 100 UNIT/ML KwikPen Inject 0-9 Units into the skin every 6 (six) hours. Per sliding scale: BS-70-120= 0 units, 121-150=1 unit,151-200= 2 units,201-250=3 units,251-300=5 units, 301--350=7 units,351-400= 9 units, > 400 call MD 09/18/20  Yes [provider]  hydrALAZINE (APRESOLINE) 10 MG tablet Place 10 mg into feeding tube 3 (three) times daily. 08/10/20  Yes [provider]  hydroxypropyl methylcellulose / hypromellose (ISOPTO TEARS / GONIOVISC) 2.5 % ophthalmic solution Place 1 drop into both eyes 4 (four) times daily as needed for dry eyes. 05/01/20  Yes Samella Parr, NP  Infant Care Products (JOHNSONS BABY SHAMPOO EX) Apply 1 application topically See admin instructions.  Wash bilateral eyes once daily   Yes [provider]  insulin detemir (LEVEMIR) 100 UNIT/ML injection Inject 0.11 mLs (11 Units total) into the skin daily. Patient taking differently: Inject 8 Units into the skin 2 (two) times daily. 10/04/20  Yes Regalado, Belkys A, MD  levothyroxine (SYNTHROID) 112 MCG tablet Place 1 tablet (112 mcg total) into feeding tube daily before breakfast. 05/02/20  Yes Samella Parr, NP  liver oil-zinc oxide (DESITIN) 40 % ointment Apply topically 2 (two) times daily. Patient taking differently: Apply 1 application topically in the morning and at bedtime. 05/01/20  Yes Samella Parr, NP  methocarbamol (ROBAXIN) 750 MG tablet Place 1 tablet (750 mg total) into feeding tube 4 (four) times daily. 05/01/20  Yes Samella Parr, NP  modafinil (PROVIGIL) 200 MG tablet Place 1 tablet (200 mg total) into feeding tube daily. 05/02/20  Yes Samella Parr, NP  Nutritional Supplements (FEEDING SUPPLEMENT, GLUCERNA 1.2 CAL,) LIQD Place 1,000 mLs into feeding tube continuous. Patient taking differently: Place 60 mLs into feeding tube See admin  instructions. 60 cc/ hr every shift 05/01/20  Yes Samella Parr, NP  Nutritional Supplements (FEEDING SUPPLEMENT, PROSOURCE TF,) liquid Place 45 mLs into feeding tube 3 (three) times daily. 05/01/20  Yes Samella Parr, NP  oxyCODONE (OXY IR/ROXICODONE) 5 MG immediate release tablet Place 0.5 tablets (2.5 mg total) into feeding tube every 8 (eight) hours. Patient taking differently: Place 5 mg into feeding tube every 4 (four) hours. Hold for lethargy 05/01/20  Yes Samella Parr, NP  polyethylene glycol (MIRALAX / GLYCOLAX) 17 g packet Place 17 g into feeding tube daily as needed for moderate constipation. 05/01/20  Yes Samella Parr, NP  rosuvastatin (CRESTOR) 5 MG tablet Place 0.5 tablets (2.5 mg total) into feeding tube daily at 6 PM. 05/01/20  Yes Samella Parr, NP  sodium hypochlorite (DAKIN'S 1/4 STRENGTH) 0.125 % SOLN Irrigate with 1 application as directed 2 (two) times daily. Use to cleanse PU  to coccyx   Yes [provider]  Water For Irrigation, Sterile (FREE WATER) SOLN Place 200 mLs into feeding tube every 4 (four) hours. 10/03/20  Yes Regalado, Belkys A, MD  amoxicillin-clavulanate (AUGMENTIN) 875-125 MG tablet Take 1 tablet by mouth See admin instructions. Bid x 7 days Patient not taking: No sig reported    [provider]  cefTRIAXone (ROCEPHIN) 1 g injection Inject 1 g into the muscle once. Patient not taking: No sig reported    [provider]  levETIRAcetam (KEPPRA) 100 MG/ML solution Take 2.5 mLs (250 mg total) by mouth 2 (two) times daily. Patient not taking: No sig reported 10/20/20   Marcial Pacas, MD   Allergies  Allergen Reactions   Atorvastatin Other (See Comments)    "Aches"   Ezetimibe-Simvastatin Other (See Comments)    "Aches"   Lovastatin Other (See Comments)    Weakness   Metformin Hcl Diarrhea   Pravastatin Other (See Comments)    Weakness   Codeine Anxiety and Other (See Comments)    Keeps the patient awake   Simvastatin  Nausea Only and Other (See Comments)    Muscle weakness and an stomach upset   Sulfa Antibiotics Other (See Comments)    Gastric intolerance and caused stomach upset   Sulfites Other (See Comments)    Abdominal pain   Review of Systems  Unable to perform ROS: Acuity of condition   Physical Exam Vitals and nursing note reviewed.  Constitutional:  General: She is not in acute distress.    Appearance: She is ill-appearing.  Pulmonary:     Effort: No respiratory distress.  Skin:    General: Skin is cool and dry.  Neurological:     Mental Status: She is unresponsive.     Motor: Weakness present.  Psychiatric:        Speech: She is noncommunicative.    Vital Signs: BP (!) 123/58   Pulse 82   Temp (!) 101.7 F (38.7 C) (Temporal)   Resp (!) 26   Ht 5' 1"  (1.549 m)   Wt 81.6 kg   SpO2 100%   BMI 34.01 kg/m      Pain Score: 0-No pain   SpO2: SpO2: 100 % O2 Device:SpO2: 100 % O2 Flow Rate: .O2 Flow Rate (L/min): 5 L/min  IO: Intake/output summary: No intake or output data in the 24 hours ending 11/19/2020 1219  LBM:   Baseline Weight: Weight: 81.6 kg Most recent weight: Weight: 81.6 kg     Palliative Assessment/Data: PPS 10%     Time In: 1245 Time Out: 1400 Time Total: 75 minutes  Greater than 50%  of this time was spent counseling and coordinating care related to the above assessment and plan.  Signed by: Lin Landsman, NP   Please contact Palliative Medicine Team phone at [phone] for questions and concerns.  For individual provider: See Shea Evans

## 2020-11-07 NOTE — ED Provider Notes (Addendum)
[facility] EMERGENCY DEPARTMENT Provider Note   CSN: [id number] Arrival date & time: 11/27/2020  3329     History Chief Complaint  Patient presents with   Unresponsive    Hannah Fischer is a 87 y.o. female.  The history is provided by the EMS personnel. The history is limited by the condition of the patient (level 5 caveat non verbal dementia).  Shortness of Breath Severity:  Severe Onset quality:  Sudden Duration:  6 hours Timing:  Constant Progression:  Worsening Chronicity:  New Context: not URI   Relieved by:  Nothing Worsened by:  Nothing Ineffective treatments:  None tried Associated symptoms: fever   Associated symptoms: no vomiting   Associated symptoms comment:  Rales  Risk factors: no recent surgery   Patient with dementia who is non-verbal with AMS and sudden onset SOB at midnight.      Past Medical History:  Diagnosis Date   Anemia    chronic   Anxiety    Arthritis    djd   Arthritis of shoulder region, left, degenerative 12/03/2013   Chronic pain syndrome 04/29/2019   Chronic right-sided low back pain without sciatica 04/12/2019   Complication of anesthesia    unable to breathe lying  flat on back   Diabetes mellitus    type 2  niddm x 25 yrs   Diverticular disease    DJD (degenerative joint disease)    lumbar and cervical   GERD (gastroesophageal reflux disease)    Hyperlipemia    Hypertension    on medication   Hypothyroidism    takes levoxyl   Irritable bowel syndrome    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   Neuropathy in diabetes [facility])    Other and unspecified angina pectoris    tests came back NOT heart related   Pulmonary embolism (HCC) 06/2010   bilateral   TIA (transient ischemic attack) 11/15/2011    Patient Active Problem List   Diagnosis Date Noted   Jerking movements of extremities 10/20/2020   Chronic right Ureterovesical junction (UVJ) obstruction 05/01/2020   Acute lower UTI 04/20/2020    Palliative care by specialist    Goals of care, counseling/discussion    DNR (do not resuscitate)    Acute bilateral thalamic infarction (HCC) 04/12/2020   Dysphagia 04/12/2020   CKD (chronic kidney disease), symptom management only, stage 3 (moderate) (HCC) 04/12/2020   Hydronephrosis 04/12/2020   Group B streptococcal UTI 04/12/2020   Uncontrolled type 2 diabetes mellitus with hyperglycemia, with long-term current use of insulin (HCC) 04/12/2020   Chronic diastolic heart failure (HCC) 04/12/2020   Essential hypertension 04/12/2020   Physical debility 04/12/2020   Left hemiplegia (HCC) 04/12/2020   History of embolic stroke 03/22/2020   Acute encephalopathy 03/18/2020   Gait abnormality 01/19/2020   Chest pressure 01/19/2020   Cerebral amyloid angiopathy (CODE)    Stroke (HCC) 11/24/2019   AKI (acute kidney injury) (HCC) 11/24/2019   Spinal stenosis of lumbar region with neurogenic claudication 10/26/2019   Abnormality of gait 08/02/2019   Cerebrovascular accident (CVA) (HCC) 07/22/2019   Dizziness 06/15/2019   Brain aneurysm 06/15/2019   Chronic pain syndrome 04/29/2019   Chronic right-sided low back pain without sciatica 04/12/2019   Arthritis of shoulder region, left, degenerative 12/03/2013   Nonspecific abnormal electrocardiogram (ECG) (EKG) 10/15/2013   Other and unspecified angina pectoris 10/15/2013   TIA (transient ischemic attack) 11/15/2011   HTN (hypertension) 11/15/2011   Diabetes mellitus (  HCC) 11/15/2011   Hypothyroidism 11/15/2011   Dyslipidemia 11/15/2011   GERD (gastroesophageal reflux disease) 11/15/2011   Pyuria 11/15/2011    Past Surgical History:  Procedure Laterality Date   BACK SURGERY     BLEPHAROPLASTY  2012   rt eye   CARDIAC CATHETERIZATION     had one 10/2013   CHOLECYSTECTOMY     EYE SURGERY  2007   cataract ext/ iol rt eye   IR GASTROSTOMY TUBE MOD SED  04/17/2020   IR NEPHROSTOGRAM RIGHT THRU EXISTING ACCESS  04/30/2020   IR  NEPHROSTOMY PLACEMENT RIGHT  03/27/2020   IR US GUIDANCE  03/27/2020   IR US GUIDANCE  04/30/2020   JOINT REPLACEMENT  2001,2005   both knees   LEFT HEART CATHETERIZATION WITH CORONARY ANGIOGRAM N/A 10/18/2013   Procedure: LEFT HEART CATHETERIZATION WITH CORONARY ANGIOGRAM;  Surgeon: Corky Crafts, MD;  Location: [facility] CATH LAB;  Service: Cardiovascular;  Laterality: N/A;   LUMBAR DISC SURGERY  1990   REVERSE SHOULDER ARTHROPLASTY Left 12/03/2013   Procedure: LEFT SHOULDER REVERSE ARTHROPLASTY;  Surgeon: Verlee Rossetti, MD;  Location: [facility] OR;  Service: Orthopedics;  Laterality: Left;   SHOULDER HEMI-ARTHROPLASTY  12/25/2010   Procedure: SHOULDER HEMI-ARTHROPLASTY;  Surgeon: Cammy Copa;  Location: [facility] OR;  Service: Orthopedics;  Laterality: Right;     OB History   No obstetric history on file.     Family History  Problem Relation Age of Onset   Diabetes Mother    Heart disease Mother    Heart disease Sister    Heart disease Brother     Social History   Tobacco Use   Smoking status: Never   Smokeless tobacco: Never  Substance Use Topics   Alcohol use: No   Drug use: No    Home Medications Prior to Admission medications   Medication Sig Start Date End Date Taking? Authorizing Provider  acetaminophen (TYLENOL) 325 MG tablet 2 tablets (650 mg total) by Per NG tube route every 6 (six) hours. 05/01/20  Yes Russella Dar, NP  amLODipine (NORVASC) 5 MG tablet Place 1 tablet (5 mg total) into feeding tube daily. 05/02/20  Yes Russella Dar, NP  amoxicillin-clavulanate (AUGMENTIN) 875-125 MG tablet Take 1 tablet by mouth See admin instructions. Bid x 7 days   Yes [provider]  busPIRone (BUSPAR) 5 MG tablet Place 7.5 mg into feeding tube 2 (two) times daily.   Yes [provider]  clopidogrel (PLAVIX) 75 MG tablet Place 1 tablet (75 mg total) into feeding tube daily. 05/02/20  Yes Russella Dar, NP  Collagenase (SANTYL EX) Apply 1 application  topically daily.   Yes [provider]  diclofenac Sodium (VOLTAREN) 1 % GEL Apply 4 g topically in the morning, at noon, and at bedtime. 08/09/20  Yes [provider]  docusate (COLACE) 50 MG/5ML liquid Place 10 mLs (100 mg total) into feeding tube 2 (two) times daily as needed for mild constipation. 05/01/20  Yes Russella Dar, NP  Ferrous Sulfate 220 (44 Fe) MG/5ML SOLN Give 7.4 mLs by tube in the morning.   Yes [provider]  HUMALOG KWIKPEN 100 UNIT/ML KwikPen Inject 0-9 Units into the skin every 6 (six) hours. Per sliding scale: BS-70-120= 0 units, 121-150=1 unit,151-200= 2 units,201-250=3 units,251-300=5 units, 301--350=7 units,351-400= 9 units, > 400 call MD 09/18/20  Yes [provider]  hydrALAZINE (APRESOLINE) 10 MG tablet Place 10 mg into feeding tube 3 (three) times daily. 08/10/20  Yes [provider]  hydroxypropyl methylcellulose / hypromellose (ISOPTO TEARS / GONIOVISC) 2.5 % ophthalmic solution Place 1 drop into both eyes 4 (four) times daily as needed for dry eyes. 05/01/20  Yes Russella Dar, NP  Infant Care Products (JOHNSONS BABY SHAMPOO EX) Apply 1 application topically See admin instructions. Wash bilateral eyes once daily   Yes [provider]  insulin detemir (LEVEMIR) 100 UNIT/ML injection Inject 0.11 mLs (11 Units total) into the skin daily. Patient taking differently: Inject 8 Units into the skin 2 (two) times daily. 10/04/20  Yes Regalado, Belkys A, MD  levothyroxine (SYNTHROID) 112 MCG tablet Place 1 tablet (112 mcg total) into feeding tube daily before breakfast. 05/02/20  Yes Russella Dar, NP  liver oil-zinc oxide (DESITIN) 40 % ointment Apply topically 2 (two) times daily. Patient taking differently: Apply 1 application topically in the morning and at bedtime. 05/01/20  Yes Russella Dar, NP  methocarbamol (ROBAXIN) 750 MG tablet Place 1 tablet (750 mg total) into feeding tube 4 (four) times daily. Patient  taking differently: Place 750 mg into feeding tube every 6 (six) hours. 05/01/20  Yes Russella Dar, NP  modafinil (PROVIGIL) 200 MG tablet Place 1 tablet (200 mg total) into feeding tube daily. 05/02/20  Yes Russella Dar, NP  Nutritional Supplements (FEEDING SUPPLEMENT, GLUCERNA 1.2 CAL,) LIQD Place 1,000 mLs into feeding tube continuous. Patient taking differently: Place 60 mLs into feeding tube See admin instructions. 60 cc/ hr every shift 05/01/20  Yes Russella Dar, NP  Nutritional Supplements (FEEDING SUPPLEMENT, PROSOURCE TF,) liquid Place 45 mLs into feeding tube 3 (three) times daily. 05/01/20  Yes Russella Dar, NP  oxyCODONE (OXY IR/ROXICODONE) 5 MG immediate release tablet Place 0.5 tablets (2.5 mg total) into feeding tube every 8 (eight) hours. Patient taking differently: Place 5 mg into feeding tube every 4 (four) hours. Hold for lethargy 05/01/20  Yes Russella Dar, NP  polyethylene glycol (MIRALAX / GLYCOLAX) 17 g packet Place 17 g into feeding tube daily as needed for moderate constipation. 05/01/20  Yes Russella Dar, NP  rosuvastatin (CRESTOR) 5 MG tablet Place 0.5 tablets (2.5 mg total) into feeding tube daily at 6 PM. 05/01/20  Yes Russella Dar, NP  sodium hypochlorite (DAKIN'S 1/4 STRENGTH) 0.125 % SOLN Irrigate with 1 application as directed 2 (two) times daily. Use to cleanse PU  to coccyx   Yes [provider]  Amino Acids-Protein Hydrolys (FEEDING SUPPLEMENT, PRO-STAT SUGAR FREE 64,) LIQD Place 30 mLs into feeding tube 3 (three) times daily with meals.    [provider]  cefTRIAXone (ROCEPHIN) 1 g injection Inject 1 g into the muscle once. Patient not taking: Reported on 2020-11-24    [provider]  levETIRAcetam (KEPPRA) 100 MG/ML solution Take 2.5 mLs (250 mg total) by mouth 2 (two) times daily. 10/20/20   Levert Feinstein, MD  Water For Irrigation, Sterile (FREE WATER) SOLN Place 200 mLs into feeding tube every 4 (four) hours. 10/03/20    Regalado, Belkys A, MD    Allergies    Atorvastatin, Ezetimibe-simvastatin, Lovastatin, Metformin hcl, Pravastatin, Codeine, Simvastatin, Sulfa antibiotics, and Sulfites  Review of Systems   Review of Systems  Unable to perform ROS: Acuity of condition  Constitutional:  Positive for fever.  HENT:  Negative for facial swelling.   Eyes:  Negative for redness.  Respiratory:  Positive for shortness of breath.   Gastrointestinal:  Negative for vomiting.  Musculoskeletal:  Negative for  neck stiffness.  Psychiatric/Behavioral:  Negative for agitation.    Physical Exam Updated Vital Signs BP (!) 123/59   Pulse 87   Temp (!) 101.7 F (38.7 C) (Temporal)   Resp (!) 39   Ht 5\' 1"  (1.549 m)   Wt 81.6 kg   SpO2 97%   BMI 34.01 kg/m   Physical Exam Vitals and nursing note reviewed. Exam conducted with a chaperone present.  Constitutional:      Appearance: She is not diaphoretic.  HENT:     Head: Normocephalic and atraumatic.     Nose: Nose normal.  Eyes:     Conjunctiva/sclera: Conjunctivae normal.     Pupils: Pupils are equal, round, and reactive to light.  Cardiovascular:     Rate and Rhythm: Normal rate and regular rhythm.     Pulses: Normal pulses.     Heart sounds: Normal heart sounds.  Pulmonary:     Effort: Tachypnea and accessory muscle usage present.     Breath sounds: Rales present.  Abdominal:     General: Bowel sounds are normal.     Tenderness: There is no abdominal tenderness. There is no guarding.  Musculoskeletal:        General: No deformity.     Cervical back: Normal range of motion and neck supple.  Skin:    General: Skin is warm and dry.     Capillary Refill: Capillary refill takes less than 2 seconds.  Neurological:     Deep Tendon Reflexes: Reflexes normal.  Psychiatric:     Comments: Unable     ED Results / Procedures / Treatments   Labs (all labs ordered are listed, but only abnormal results are displayed) Labs Reviewed  I-STAT CHEM 8, ED -  Abnormal; Notable for the following components:      Result Value   Sodium 129 (*)    Potassium 5.4 (*)    Chloride 90 (*)    BUN 129 (*)    Creatinine, Ser 2.10 (*)    Glucose, Bld 420 (*)    Calcium, Ion 1.13 (*)    Hemoglobin 9.2 (*)    HCT 27.0 (*)    All other components within normal limits  RESP PANEL BY RT-PCR (FLU A&B, COVID) ARPGX2  URINE CULTURE  CULTURE, BLOOD (ROUTINE X 2)  CULTURE, BLOOD (ROUTINE X 2)  CBC WITH DIFFERENTIAL/PLATELET  BRAIN NATRIURETIC PEPTIDE  LACTIC ACID, PLASMA  BASIC METABOLIC PANEL  URINALYSIS, ROUTINE W REFLEX MICROSCOPIC  I-STAT ARTERIAL BLOOD GAS, ED  TROPONIN I (HIGH SENSITIVITY)    EKG None  Radiology No results found.  Procedures Procedures   Medications Ordered in ED Medications  vancomycin (VANCOREADY) IVPB 1500 mg/300 mL (1,500 mg Intravenous New Bag/Given 11/15/2020 0630)  furosemide (LASIX) injection 80 mg (80 mg Intravenous Given 11/15/2020 0615)  ceFEPIme (MAXIPIME) 2 g in sodium chloride 0.9 % 100 mL IVPB (0 g Intravenous Stopped 11/15/20 0645)   MDM Reviewed: previous chart, nursing note and vitals Interpretation: x-ray, labs and ECG (pulmonary edema by me on CXR) Total time providing critical care: 30-74 minutes (bipap initiated). This excludes time spent performing separately reportable procedures and services.  CRITICAL CARE Performed by: Teran Knittle K Omer Puccinelli-Rasch Total critical care time: 30 minutes Critical care time was exclusive of separately billable procedures and treating other patients. Critical care was necessary to treat or prevent imminent or life-threatening deterioration. Critical care was time spent personally by me on the following activities: development of treatment plan with patient and/or surrogate  as well as nursing, discussions with consultants, evaluation of patient's response to treatment, examination of patient, obtaining history from patient or surrogate, ordering and performing treatments and  interventions, ordering and review of laboratory studies, ordering and review of radiographic studies, pulse oximetry and re-evaluation of patient's condition.  ED Course  I have reviewed the triage vital signs and the nursing notes.  Pertinent labs & imaging results that were available during my care of the patient were reviewed by me and considered in my medical decision making (see chart for details).     Final Clinical Impression(s) / ED Diagnoses Final diagnoses:  None   Signed out to Dr. Particia Nearing pending labs, will need admit    Kyasia Steuck, MD 11-23-2020 [phone]

## 2020-11-07 NOTE — Progress Notes (Signed)
[facility] ED  Hospice hospital liaison note  Received request from [facility] for family interest in [facility]. Chart reviewed and [facility] eligibility is confirmed at this time. Liaison met at bedside and discussed hospice and [place]. Unfortunately [employer] [facility] does not have a bed to offer today. TOC is aware hospital liaison will follow up tomorrow or sooner if room becomes available.   Please do not hesitate to call with any hospice related questions or concerns.   Thank you for the opportunity to participate in this patient's care.  Jhonnie Garner, Therapist, sports, [facility] Liaison  [phone]

## 2020-11-07 NOTE — ED Notes (Signed)
Family bedside, RN updated.

## 2020-11-07 NOTE — ED Notes (Signed)
Pt switched to  @ 5LPM. Resting. Family is not at bedside at this time.

## 2020-11-08 LAB — BLOOD CULTURE ID PANEL (REFLEXED) - BCID2

## 2020-11-10 LAB — CULTURE, BLOOD (ROUTINE X 2)
Special Requests: ADEQUATE
Special Requests: ADEQUATE

## 2020-12-05 NOTE — ED Notes (Signed)
Placed Breakfast orders 

## 2020-12-05 NOTE — Progress Notes (Signed)
PHARMACY - PHYSICIAN COMMUNICATION CRITICAL VALUE ALERT - BLOOD CULTURE IDENTIFICATION (BCID)  Hannah Fischer is an 87 y.o. female who presented to [facility] on 11/15/2020 with a chief complaint of sepsis  Assessment:  4/4 blood culture bottles with staph epi (no resistance)  Name of physician (or Provider) Contacted: Dr. Kennon Rounds  Current antibiotics: None  Changes to prescribed antibiotics recommended:  No antibiotics as pt is comfort care  Results for orders placed or performed during the hospital encounter of 11/14/2020  Blood Culture ID Panel (Reflexed) (Collected: 11/27/2020  6:10 AM)  Result Value Ref Range   Enterococcus faecalis NOT DETECTED NOT DETECTED   Enterococcus Faecium NOT DETECTED NOT DETECTED   Listeria monocytogenes NOT DETECTED NOT DETECTED   Staphylococcus species DETECTED (A) NOT DETECTED   Staphylococcus aureus (BCID) NOT DETECTED NOT DETECTED   Staphylococcus epidermidis NOT DETECTED NOT DETECTED   Staphylococcus lugdunensis NOT DETECTED NOT DETECTED   Streptococcus species NOT DETECTED NOT DETECTED   Streptococcus agalactiae NOT DETECTED NOT DETECTED   Streptococcus pneumoniae NOT DETECTED NOT DETECTED   Streptococcus pyogenes NOT DETECTED NOT DETECTED   A.calcoaceticus-baumannii NOT DETECTED NOT DETECTED   Bacteroides fragilis NOT DETECTED NOT DETECTED   Enterobacterales NOT DETECTED NOT DETECTED   Enterobacter cloacae complex NOT DETECTED NOT DETECTED   Escherichia coli NOT DETECTED NOT DETECTED   Klebsiella aerogenes NOT DETECTED NOT DETECTED   Klebsiella oxytoca NOT DETECTED NOT DETECTED   Klebsiella pneumoniae NOT DETECTED NOT DETECTED   Proteus species NOT DETECTED NOT DETECTED   Salmonella species NOT DETECTED NOT DETECTED   Serratia marcescens NOT DETECTED NOT DETECTED   Haemophilus influenzae NOT DETECTED NOT DETECTED   Neisseria meningitidis NOT DETECTED NOT DETECTED   Pseudomonas aeruginosa NOT DETECTED NOT DETECTED   Stenotrophomonas  maltophilia NOT DETECTED NOT DETECTED   Candida albicans NOT DETECTED NOT DETECTED   Candida auris NOT DETECTED NOT DETECTED   Candida glabrata NOT DETECTED NOT DETECTED   Candida krusei NOT DETECTED NOT DETECTED   Candida parapsilosis NOT DETECTED NOT DETECTED   Candida tropicalis NOT DETECTED NOT DETECTED   Cryptococcus neoformans/gattii NOT DETECTED NOT DETECTED    Christoper Fabian, PharmD, BCPS Please see amion for complete clinical pharmacist phone list 2020-11-14  3:56 AM

## 2020-12-05 NOTE — ED Notes (Addendum)
Verified patient had no heart or lung sounds on auscultation at 0908 with another RN, Bettina Gavia RN.  Verified patient asystole in 2 leads.  Hannie notifying admitting provider and primary RN. Family remains at bedside and minister coming to hospital. TOD [phone]

## 2020-12-05 NOTE — ED Notes (Signed)
Dr. Patel paged.  

## 2020-12-05 NOTE — ED Notes (Signed)
This RN found pt to have one gray toned ring with white stones, and one yellow toned ring with white stone. Rings removed and given to family at bedside, per family request.

## 2020-12-05 NOTE — Death Summary Note (Addendum)
  DEATH SUMMARY   Patient Details  Name: Hannah Fischer MRN: [id number] DOB: Oct 15, 1932  Admission/Discharge Information   Admit Date:  11/25/2020  Date of Death: Date of Death: 26-Nov-2020  Time of Death: Time of Death: 0908  Length of Stay: 1  Code Status:   Referring Physician: Sigmund Hazel, MD    Reason(s) for Hospitalization  Sepsis, possibly related to UTI  Diagnoses  Preliminary cause of death: sepsis, possibly related to UTI Secondary Diagnoses (including complications and co-morbidities):  Principal Problem:   Admission for end of life care Active Problems:   Stroke [facility])   Sepsis due to urinary tract infection (HCC)   Acute on chronic respiratory failure with hypoxia [facility])  Brief Hospital Course  past medical history of CVA with contractures, non-verbal, unable to follow commands,  chronic pain syndrome, DM, HTN, HLD, hypothyroidism, PE, 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 Patient was placed on BiPap in ED. After admitting MD discussion with family, they opted for transition to full comfort measures. Patient was admitted on 2020/11/25 for end of life care.   End of life care -Patient presenting with apparent sepsis, possibly related to UTI -After discussion in the ER, family has decided to proceed with comfort care only -appeared to be actively dying at this time of admission  -Comfort care order set utilized -Pain control with morphine drip, as the patient is unable to vocalize whether she is having pain   AKI -Normal baseline renal function, currently with AKI -She is on tube feeds so she should be receiving appropriate hydration at baseline -She may have increased metabolic needs in the setting of infection -No further treatment, as patient is transitioning to comfort only   H/o  CVA -Significant deficits on exam -PEG tube, no PO intake at baseline   Acute on chronic respiratory failure with hypoxia -Chronic hypoxia of uncertain cause, on [place] O2 at her facility -Marked hypoxia on presentation despite home O2 -Placed on BIPAP on arrival -Transition back to [place] O2 for comfort only   Goals of care -Patient appears to have a very limited QOL at baseline -Has DNR form at the bedside  Lynden Oxford 11/10/2020, 1:40 PM

## 2020-12-05 DEATH — deceased

## 2021-12-31 ENCOUNTER — Other Ambulatory Visit (HOSPITAL_BASED_OUTPATIENT_CLINIC_OR_DEPARTMENT_OTHER): Payer: Self-pay

## 2022-07-08 ENCOUNTER — Other Ambulatory Visit (HOSPITAL_BASED_OUTPATIENT_CLINIC_OR_DEPARTMENT_OTHER): Payer: Self-pay

## 2022-10-30 IMAGING — CT CT CHEST-ABD-PELV W/O CM
2 of 4 series · 15 of 46 positions shown, 17 images · non-contrast
Comparison: January 19, 2020

CLINICAL DATA: Weakness.  Acute kidney injury.  Hydronephrosis.

EXAM:
CT CHEST, ABDOMEN AND PELVIS WITHOUT CONTRAST
TECHNIQUE: Multidetector CT imaging of the chest, abdomen and pelvis was
performed following the standard protocol without IV contrast.

[Series 3: cap without · axial · non-contrast · 0.98mm/px · z∈[+987,+1477]mm · 12 of 114 slices shown, 14 images]
[im 8/114  soft-tissue]
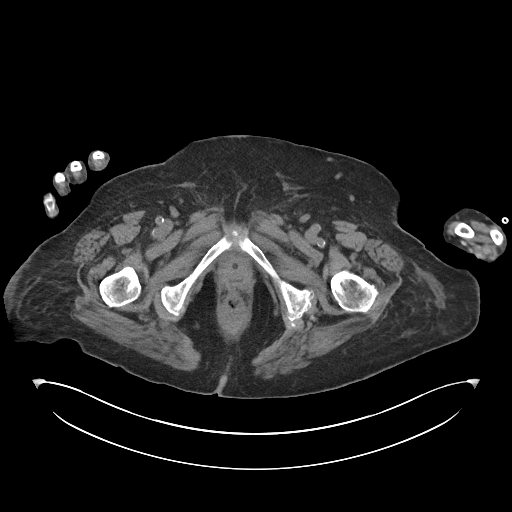
[im 8/114  bone]
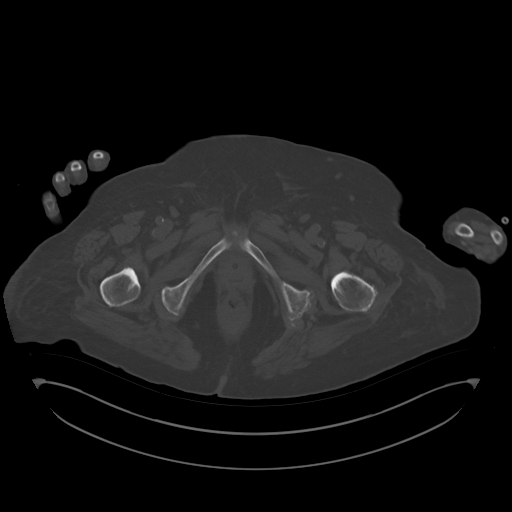
[im 16/114  soft-tissue]
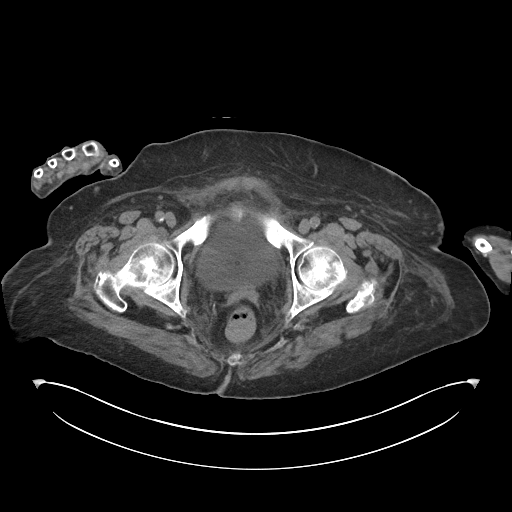
[im 23/114  soft-tissue]
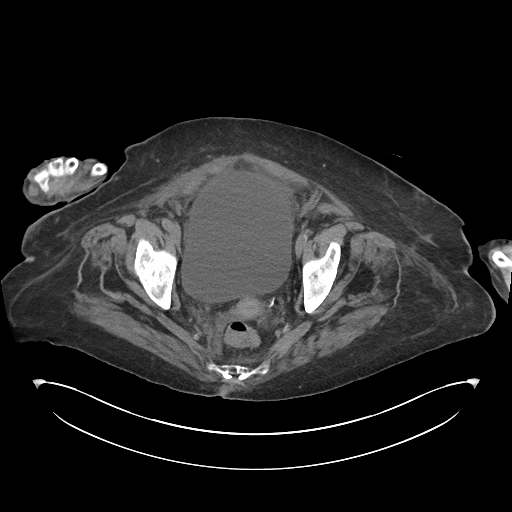
[im 38/114  soft-tissue]
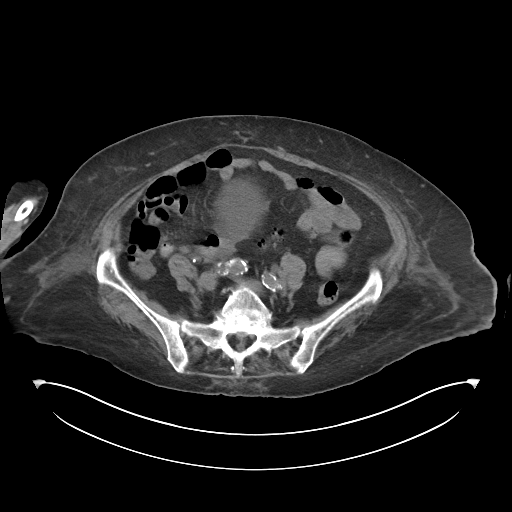
[im 46/114  soft-tissue]
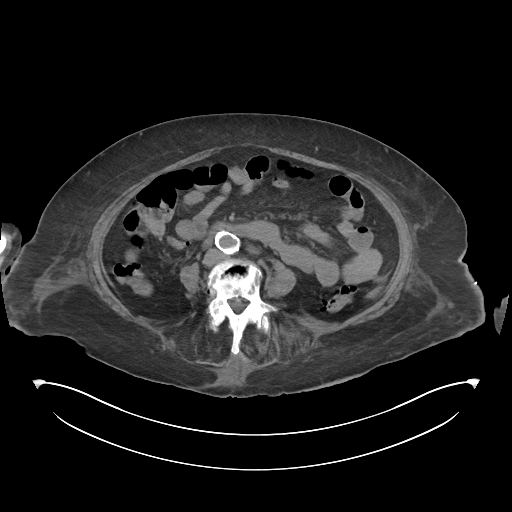
[im 53/114  soft-tissue]
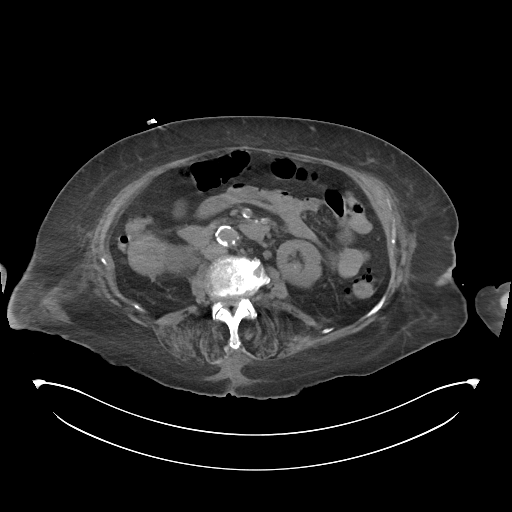
[im 61/114  soft-tissue]
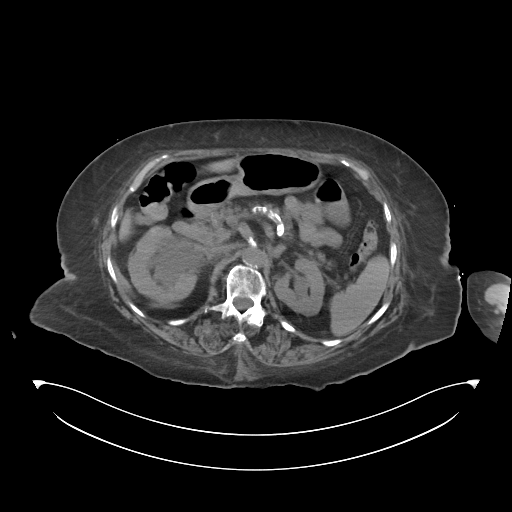
[im 68/114  soft-tissue]
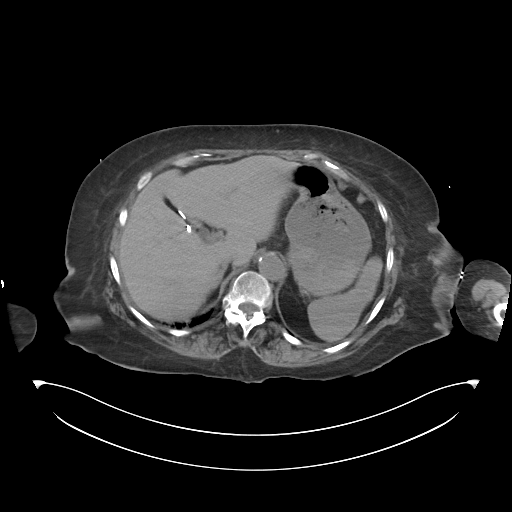
[im 76/114  soft-tissue]
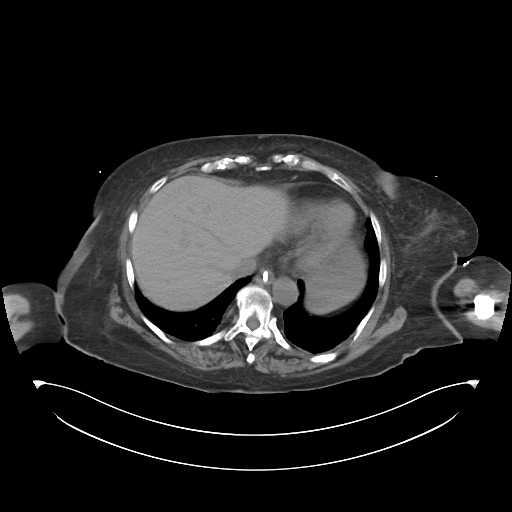
[im 76/114  bone]
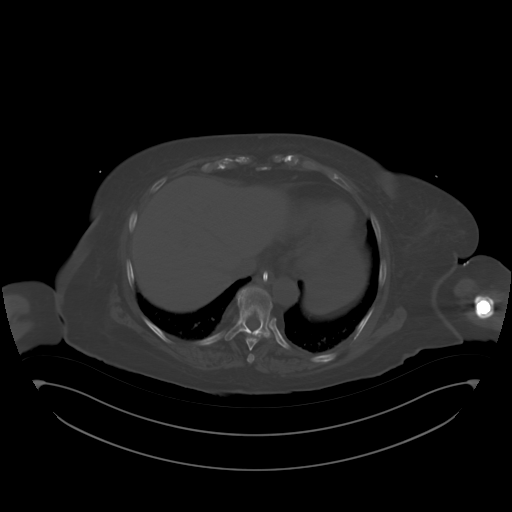
[im 91/114  soft-tissue]
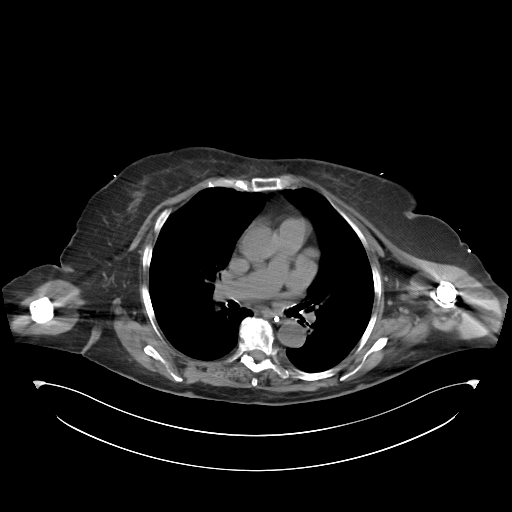
[im 98/114  soft-tissue]
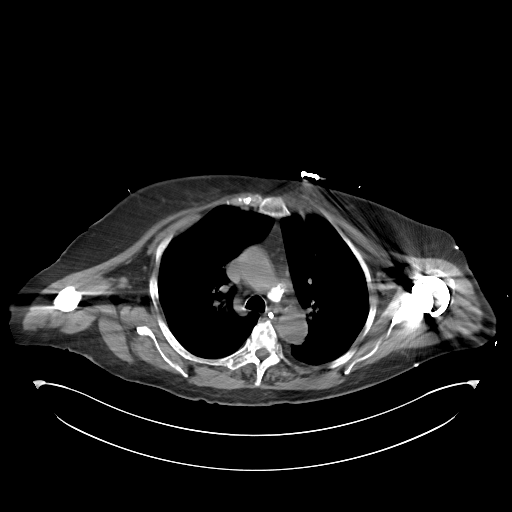
[im 106/114  soft-tissue]
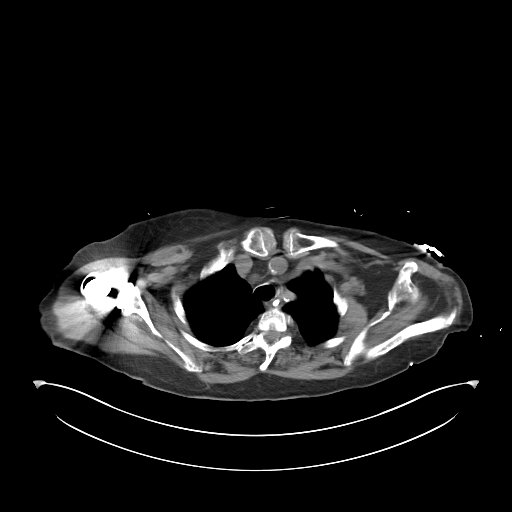

[Series 6: cor · coronal · 0.89mm/px · 3 of 96 slices shown]
[im 32/96  soft-tissue]
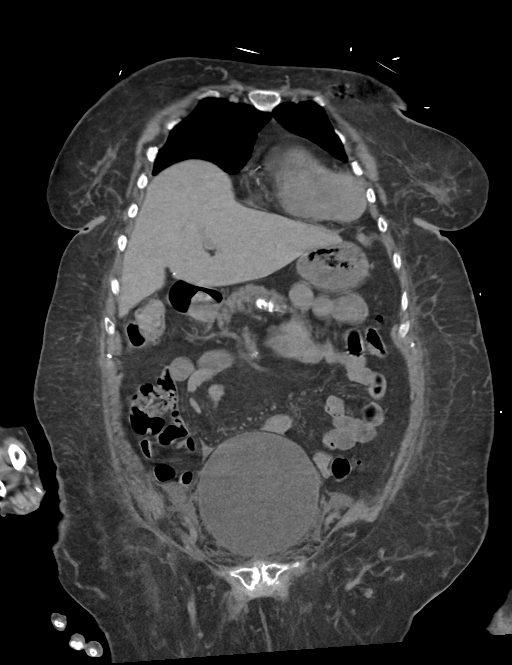
[im 43/96  soft-tissue]
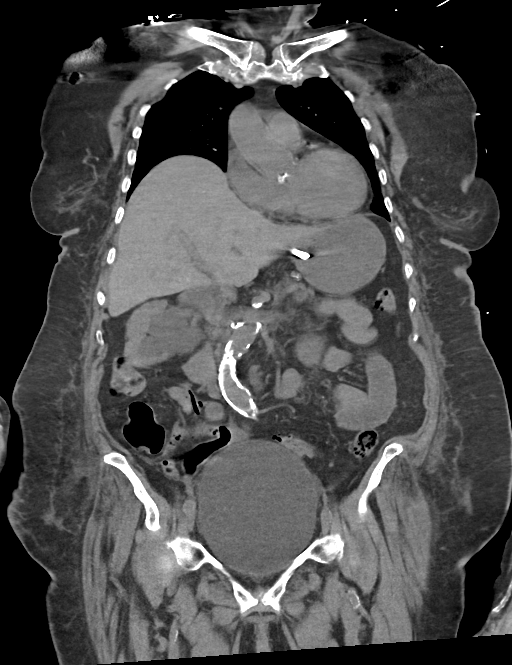
[im 53/96  soft-tissue]
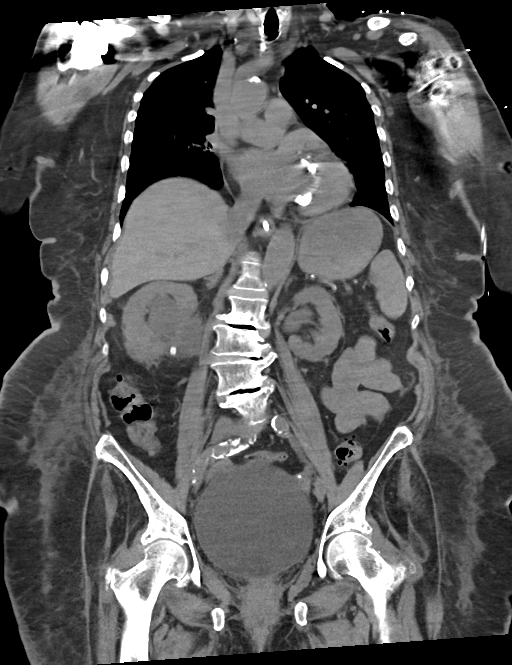

[15 of 46 positions shown; findings below may reference images not displayed]

FINDINGS: CT CHEST FINDINGS

Cardiovascular: The heart size is unremarkable. There are coronary
artery calcifications. There are atherosclerotic changes of the
thoracic aorta without evidence for an aneurysm. There is a trace
pericardial effusion.

Mediastinum/Nodes:

-- No mediastinal lymphadenopathy.

-- No hilar lymphadenopathy.

-- No axillary lymphadenopathy.

-- No supraclavicular lymphadenopathy.

-- Normal thyroid gland where visualized.

-the enteric feeding tube terminates near the GE junction.
Repositioning is recommended.

Lungs/Pleura: There is scarring versus atelectasis at the lung
bases. The trachea is unremarkable. There is no pneumothorax or
large pleural effusion.

Musculoskeletal: No chest wall abnormality. No bony spinal canal
stenosis.

CT ABDOMEN PELVIS FINDINGS

Hepatobiliary: The liver is normal. Status post
cholecystectomy.There is no biliary ductal dilation.

Pancreas: Normal contours without ductal dilatation. No
peripancreatic fluid collection.

Spleen: Unremarkable.

Adrenals/Urinary Tract:

--Adrenal glands: Unremarkable.

--Right kidney/ureter: There is severe right-sided hydronephrosis
which is new since January 19, 2020. There is a nonobstructing
stone in the renal pelvis. The level of obstruction appears to be at
the right UPJ.

--Left kidney/ureter: There is mild left-sided hydroureteronephrosis
without definite evidence for an obstructing stone. There is a
punctate nonobstructing stone in the lower pole the left kidney.

--Urinary bladder: The urinary bladder is significantly distended
despite Foley catheter placement.

Stomach/Bowel:

--Stomach/Duodenum: No hiatal hernia or other gastric abnormality.
Normal duodenal course and caliber.

--Small bowel: Unremarkable.

--Colon: Rectosigmoid diverticulosis without acute inflammation.

--Appendix: Normal.

Vascular/Lymphatic: Atherosclerotic calcification is present within
the non-aneurysmal abdominal aorta, without hemodynamically
significant stenosis.

--No retroperitoneal lymphadenopathy.

--No mesenteric lymphadenopathy.

--No pelvic or inguinal lymphadenopathy.

Reproductive: Unremarkable

Other: There is a small volume of free fluid the patient's pelvis.
The abdominal wall is normal.

Musculoskeletal. Moderate degenerative changes are noted throughout
the visualized thoracolumbar spine. There is diffuse osteopenia. No
definite acute displaced fracture.
IMPRESSION: 1. Severe right-sided hydronephrosis, new since January 19, 2020.
This appears to be secondary to a right UPJ obstruction. There is no
obstructing stone visualized on this study.
2. Mild left-sided hydroureteronephrosis without definite evidence
for an obstructing stone.
3. The urinary bladder is significantly distended despite Foley
catheter placement. Query catheter malfunction.
4. Enteric feeding tube terminates near the GE junction.
Repositioning is recommended.
5. Rectosigmoid diverticulosis without acute inflammation.
6. Small volume of free fluid the patient's pelvis.

Aortic Atherosclerosis ([id number]).
